# Patient Record
Sex: Male | Born: 1950 | Race: White | Hispanic: No | Marital: Married | Smoking: Former smoker
Health system: Southern US, Community
[De-identification: ages and names within clinical notes are randomized; demographics above are authoritative.]

## PROBLEM LIST (undated history)

## (undated) DIAGNOSIS — I219 Acute myocardial infarction, unspecified: Secondary | ICD-10-CM

## (undated) DIAGNOSIS — K222 Esophageal obstruction: Secondary | ICD-10-CM

## (undated) DIAGNOSIS — D126 Benign neoplasm of colon, unspecified: Secondary | ICD-10-CM

## (undated) DIAGNOSIS — Z87442 Personal history of urinary calculi: Secondary | ICD-10-CM

## (undated) DIAGNOSIS — R0600 Dyspnea, unspecified: Secondary | ICD-10-CM

## (undated) DIAGNOSIS — G4730 Sleep apnea, unspecified: Secondary | ICD-10-CM

## (undated) DIAGNOSIS — Z86010 Personal history of colonic polyps: Secondary | ICD-10-CM

## (undated) DIAGNOSIS — I2510 Atherosclerotic heart disease of native coronary artery without angina pectoris: Secondary | ICD-10-CM

## (undated) DIAGNOSIS — I10 Essential (primary) hypertension: Secondary | ICD-10-CM

## (undated) DIAGNOSIS — R51 Headache: Secondary | ICD-10-CM

## (undated) DIAGNOSIS — K219 Gastro-esophageal reflux disease without esophagitis: Secondary | ICD-10-CM

## (undated) DIAGNOSIS — Z720 Tobacco use: Secondary | ICD-10-CM

## (undated) DIAGNOSIS — K449 Diaphragmatic hernia without obstruction or gangrene: Secondary | ICD-10-CM

## (undated) DIAGNOSIS — E119 Type 2 diabetes mellitus without complications: Secondary | ICD-10-CM

## (undated) DIAGNOSIS — K644 Residual hemorrhoidal skin tags: Secondary | ICD-10-CM

## (undated) DIAGNOSIS — T4145XA Adverse effect of unspecified anesthetic, initial encounter: Secondary | ICD-10-CM

## (undated) DIAGNOSIS — I6529 Occlusion and stenosis of unspecified carotid artery: Secondary | ICD-10-CM

## (undated) DIAGNOSIS — N529 Male erectile dysfunction, unspecified: Secondary | ICD-10-CM

## (undated) DIAGNOSIS — M1990 Unspecified osteoarthritis, unspecified site: Secondary | ICD-10-CM

## (undated) DIAGNOSIS — T8859XA Other complications of anesthesia, initial encounter: Secondary | ICD-10-CM

## (undated) DIAGNOSIS — E785 Hyperlipidemia, unspecified: Secondary | ICD-10-CM

## (undated) DIAGNOSIS — I441 Atrioventricular block, second degree: Secondary | ICD-10-CM

## (undated) DIAGNOSIS — J189 Pneumonia, unspecified organism: Secondary | ICD-10-CM

## (undated) DIAGNOSIS — Z950 Presence of cardiac pacemaker: Secondary | ICD-10-CM

## (undated) DIAGNOSIS — J449 Chronic obstructive pulmonary disease, unspecified: Secondary | ICD-10-CM

## (undated) HISTORY — PX: CARDIAC CATHETERIZATION: SHX172

## (undated) HISTORY — DX: Type 2 diabetes mellitus without complications: E11.9

## (undated) HISTORY — DX: Sleep apnea, unspecified: G47.30

## (undated) HISTORY — DX: Essential (primary) hypertension: I10

## (undated) HISTORY — DX: Headache: R51

## (undated) HISTORY — PX: BACK SURGERY: SHX140

## (undated) HISTORY — DX: Benign neoplasm of colon, unspecified: D12.6

## (undated) HISTORY — DX: Atherosclerotic heart disease of native coronary artery without angina pectoris: I25.10

## (undated) HISTORY — DX: Acute myocardial infarction, unspecified: I21.9

## (undated) HISTORY — PX: COLONOSCOPY: SHX174

## (undated) HISTORY — DX: Residual hemorrhoidal skin tags: K64.4

## (undated) HISTORY — DX: Hyperlipidemia, unspecified: E78.5

## (undated) HISTORY — DX: Gastro-esophageal reflux disease without esophagitis: K21.9

## (undated) HISTORY — DX: Esophageal obstruction: K22.2

## (undated) HISTORY — DX: Chronic obstructive pulmonary disease, unspecified: J44.9

## (undated) HISTORY — DX: Male erectile dysfunction, unspecified: N52.9

## (undated) HISTORY — DX: Occlusion and stenosis of unspecified carotid artery: I65.29

## (undated) HISTORY — PX: ANGIOPLASTY: SHX39

## (undated) HISTORY — DX: Diaphragmatic hernia without obstruction or gangrene: K44.9

## (undated) HISTORY — DX: Atrioventricular block, second degree: I44.1

## (undated) HISTORY — DX: Tobacco use: Z72.0

## (undated) HISTORY — DX: Personal history of colonic polyps: Z86.010

---

## 1996-07-07 ENCOUNTER — Encounter: Payer: Self-pay | Admitting: Gastroenterology

## 1996-08-25 ENCOUNTER — Encounter: Payer: Self-pay | Admitting: Gastroenterology

## 1998-01-23 ENCOUNTER — Ambulatory Visit (HOSPITAL_BASED_OUTPATIENT_CLINIC_OR_DEPARTMENT_OTHER): Admission: RE | Admit: 1998-01-23 | Discharge: 1998-01-23 | Payer: Self-pay | Admitting: Urology

## 1998-06-03 ENCOUNTER — Ambulatory Visit (HOSPITAL_COMMUNITY): Admission: RE | Admit: 1998-06-03 | Discharge: 1998-06-03 | Payer: Self-pay | Admitting: Cardiovascular Disease

## 1999-11-29 ENCOUNTER — Encounter: Payer: Self-pay | Admitting: Emergency Medicine

## 1999-11-29 ENCOUNTER — Emergency Department (HOSPITAL_COMMUNITY): Admission: EM | Admit: 1999-11-29 | Discharge: 1999-11-30 | Payer: Self-pay | Admitting: *Deleted

## 2001-03-11 ENCOUNTER — Ambulatory Visit (HOSPITAL_COMMUNITY): Admission: RE | Admit: 2001-03-11 | Discharge: 2001-03-11 | Payer: Self-pay | Admitting: Cardiovascular Disease

## 2001-06-02 ENCOUNTER — Encounter: Payer: Self-pay | Admitting: Occupational Medicine

## 2001-06-02 ENCOUNTER — Encounter: Admission: RE | Admit: 2001-06-02 | Discharge: 2001-06-02 | Payer: Self-pay | Admitting: Occupational Medicine

## 2001-12-21 ENCOUNTER — Encounter: Payer: Self-pay | Admitting: Gastroenterology

## 2004-05-01 ENCOUNTER — Ambulatory Visit: Payer: Self-pay | Admitting: Internal Medicine

## 2004-05-13 ENCOUNTER — Ambulatory Visit: Payer: Self-pay | Admitting: Gastroenterology

## 2004-05-28 ENCOUNTER — Ambulatory Visit (HOSPITAL_COMMUNITY): Admission: RE | Admit: 2004-05-28 | Discharge: 2004-05-28 | Payer: Self-pay | Admitting: Gastroenterology

## 2004-05-28 ENCOUNTER — Ambulatory Visit: Payer: Self-pay | Admitting: Gastroenterology

## 2004-07-28 ENCOUNTER — Ambulatory Visit: Payer: Self-pay | Admitting: Internal Medicine

## 2004-07-29 ENCOUNTER — Ambulatory Visit: Payer: Self-pay | Admitting: Cardiology

## 2004-07-31 ENCOUNTER — Ambulatory Visit: Payer: Self-pay | Admitting: Cardiology

## 2004-11-18 ENCOUNTER — Ambulatory Visit: Payer: Self-pay | Admitting: Internal Medicine

## 2004-12-01 ENCOUNTER — Ambulatory Visit: Payer: Self-pay | Admitting: Internal Medicine

## 2005-03-09 ENCOUNTER — Ambulatory Visit: Payer: Self-pay | Admitting: Internal Medicine

## 2005-03-16 ENCOUNTER — Ambulatory Visit: Payer: Self-pay | Admitting: Cardiology

## 2005-04-03 ENCOUNTER — Inpatient Hospital Stay (HOSPITAL_BASED_OUTPATIENT_CLINIC_OR_DEPARTMENT_OTHER): Admission: RE | Admit: 2005-04-03 | Discharge: 2005-04-03 | Payer: Self-pay | Admitting: Cardiovascular Disease

## 2006-12-29 ENCOUNTER — Encounter: Payer: Self-pay | Admitting: Gastroenterology

## 2006-12-30 DIAGNOSIS — I252 Old myocardial infarction: Secondary | ICD-10-CM | POA: Insufficient documentation

## 2006-12-30 DIAGNOSIS — I2510 Atherosclerotic heart disease of native coronary artery without angina pectoris: Secondary | ICD-10-CM | POA: Insufficient documentation

## 2006-12-30 DIAGNOSIS — J449 Chronic obstructive pulmonary disease, unspecified: Secondary | ICD-10-CM | POA: Insufficient documentation

## 2006-12-30 DIAGNOSIS — Z87442 Personal history of urinary calculi: Secondary | ICD-10-CM | POA: Insufficient documentation

## 2006-12-30 DIAGNOSIS — E785 Hyperlipidemia, unspecified: Secondary | ICD-10-CM | POA: Insufficient documentation

## 2006-12-30 DIAGNOSIS — K219 Gastro-esophageal reflux disease without esophagitis: Secondary | ICD-10-CM | POA: Insufficient documentation

## 2006-12-30 DIAGNOSIS — I10 Essential (primary) hypertension: Secondary | ICD-10-CM | POA: Insufficient documentation

## 2007-01-28 ENCOUNTER — Encounter: Payer: Self-pay | Admitting: Gastroenterology

## 2007-03-02 ENCOUNTER — Encounter (INDEPENDENT_AMBULATORY_CARE_PROVIDER_SITE_OTHER): Payer: Self-pay | Admitting: *Deleted

## 2007-03-02 ENCOUNTER — Encounter (INDEPENDENT_AMBULATORY_CARE_PROVIDER_SITE_OTHER): Payer: Self-pay | Admitting: Gastroenterology

## 2007-03-02 ENCOUNTER — Ambulatory Visit (HOSPITAL_COMMUNITY): Admission: RE | Admit: 2007-03-02 | Discharge: 2007-03-02 | Payer: Self-pay | Admitting: Gastroenterology

## 2007-09-02 ENCOUNTER — Ambulatory Visit (HOSPITAL_COMMUNITY): Admission: RE | Admit: 2007-09-02 | Discharge: 2007-09-02 | Payer: Self-pay | Admitting: Gastroenterology

## 2007-09-02 ENCOUNTER — Encounter (INDEPENDENT_AMBULATORY_CARE_PROVIDER_SITE_OTHER): Payer: Self-pay | Admitting: Gastroenterology

## 2007-09-02 ENCOUNTER — Encounter (INDEPENDENT_AMBULATORY_CARE_PROVIDER_SITE_OTHER): Payer: Self-pay | Admitting: *Deleted

## 2007-12-09 ENCOUNTER — Ambulatory Visit: Payer: Self-pay | Admitting: Vascular Surgery

## 2007-12-22 DIAGNOSIS — K449 Diaphragmatic hernia without obstruction or gangrene: Secondary | ICD-10-CM | POA: Insufficient documentation

## 2007-12-27 ENCOUNTER — Ambulatory Visit: Payer: Self-pay | Admitting: Gastroenterology

## 2007-12-27 DIAGNOSIS — Z860100 Personal history of colon polyps, unspecified: Secondary | ICD-10-CM | POA: Insufficient documentation

## 2008-01-17 ENCOUNTER — Encounter: Admission: RE | Admit: 2008-01-17 | Discharge: 2008-01-17 | Payer: Self-pay | Admitting: Orthopedic Surgery

## 2009-02-18 ENCOUNTER — Ambulatory Visit: Payer: Self-pay | Admitting: Gastroenterology

## 2009-02-18 ENCOUNTER — Encounter: Admission: RE | Admit: 2009-02-18 | Discharge: 2009-02-18 | Payer: Self-pay | Admitting: Internal Medicine

## 2009-03-06 ENCOUNTER — Ambulatory Visit: Payer: Self-pay | Admitting: Gastroenterology

## 2009-03-12 ENCOUNTER — Encounter: Payer: Self-pay | Admitting: Gastroenterology

## 2009-08-22 ENCOUNTER — Ambulatory Visit: Payer: Self-pay | Admitting: Vascular Surgery

## 2010-02-04 ENCOUNTER — Ambulatory Visit: Payer: Self-pay | Admitting: Gastroenterology

## 2010-02-04 ENCOUNTER — Encounter (INDEPENDENT_AMBULATORY_CARE_PROVIDER_SITE_OTHER): Payer: Self-pay | Admitting: *Deleted

## 2010-02-13 ENCOUNTER — Ambulatory Visit: Payer: Self-pay | Admitting: Internal Medicine

## 2010-02-13 DIAGNOSIS — J329 Chronic sinusitis, unspecified: Secondary | ICD-10-CM | POA: Insufficient documentation

## 2010-02-13 LAB — CONVERTED CEMR LAB
ALT: 19 units/L (ref 0–53)
AST: 19 units/L (ref 0–37)
Albumin: 3.9 g/dL (ref 3.5–5.2)
Alkaline Phosphatase: 58 units/L (ref 39–117)
BUN: 17 mg/dL (ref 6–23)
Basophils Absolute: 0.1 10*3/uL (ref 0.0–0.1)
Basophils Relative: 0.6 % (ref 0.0–3.0)
Bilirubin Urine: NEGATIVE
Bilirubin, Direct: 0.1 mg/dL (ref 0.0–0.3)
CO2: 27 meq/L (ref 19–32)
Calcium: 9.3 mg/dL (ref 8.4–10.5)
Chloride: 96 meq/L (ref 96–112)
Cholesterol, target level: 200 mg/dL
Cholesterol: 164 mg/dL (ref 0–200)
Creatinine, Ser: 1.2 mg/dL (ref 0.4–1.5)
Eosinophils Absolute: 0.4 10*3/uL (ref 0.0–0.7)
Eosinophils Relative: 3.8 % (ref 0.0–5.0)
GFR calc non Af Amer: 69.36 mL/min (ref >60)
Glucose, Bld: 87 mg/dL (ref 70–99)
HCT: 44.4 % (ref 39.0–52.0)
HDL goal, serum: 40 mg/dL
HDL: 40 mg/dL (ref >39.00)
Hemoglobin, Urine: NEGATIVE
Hemoglobin: 15.4 g/dL (ref 13.0–17.0)
Ketones, ur: NEGATIVE mg/dL
LDL Cholesterol: 88 mg/dL (ref 0–99)
LDL Goal: 70 mg/dL
Leukocytes, UA: NEGATIVE
Lymphocytes Relative: 42.9 % (ref 12.0–46.0)
Lymphs Abs: 4.2 10*3/uL — ABNORMAL HIGH (ref 0.7–4.0)
MCHC: 34.6 g/dL (ref 30.0–36.0)
MCV: 95.2 fL (ref 78.0–100.0)
Monocytes Absolute: 1 10*3/uL (ref 0.1–1.0)
Monocytes Relative: 9.9 % (ref 3.0–12.0)
Neutro Abs: 4.1 10*3/uL (ref 1.4–7.7)
Neutrophils Relative %: 42.8 % — ABNORMAL LOW (ref 43.0–77.0)
Nitrite: NEGATIVE
PSA: 0.47 ng/mL (ref 0.10–4.00)
Platelets: 285 10*3/uL (ref 150.0–400.0)
Potassium: 4.6 meq/L (ref 3.5–5.1)
RBC: 4.66 M/uL (ref 4.22–5.81)
RDW: 13.8 % (ref 11.5–14.6)
Sodium: 131 meq/L — ABNORMAL LOW (ref 135–145)
Specific Gravity, Urine: 1.015 (ref 1.000–1.030)
TSH: 3.07 microintl units/mL (ref 0.35–5.50)
Total Bilirubin: 0.5 mg/dL (ref 0.3–1.2)
Total CHOL/HDL Ratio: 4
Total CK: 58 units/L (ref 7–232)
Total Protein, Urine: NEGATIVE mg/dL
Total Protein: 7.1 g/dL (ref 6.0–8.3)
Triglycerides: 179 mg/dL — ABNORMAL HIGH (ref 0.0–149.0)
Urine Glucose: NEGATIVE mg/dL
Urobilinogen, UA: 0.2 (ref 0.0–1.0)
VLDL: 35.8 mg/dL (ref 0.0–40.0)
WBC: 9.7 10*3/uL (ref 4.5–10.5)
pH: 6 (ref 5.0–8.0)

## 2010-02-14 ENCOUNTER — Telehealth: Payer: Self-pay | Admitting: Internal Medicine

## 2010-02-14 ENCOUNTER — Encounter: Payer: Self-pay | Admitting: Internal Medicine

## 2010-03-10 ENCOUNTER — Ambulatory Visit: Payer: Self-pay | Admitting: Internal Medicine

## 2010-03-10 DIAGNOSIS — E871 Hypo-osmolality and hyponatremia: Secondary | ICD-10-CM | POA: Insufficient documentation

## 2010-03-10 DIAGNOSIS — F17200 Nicotine dependence, unspecified, uncomplicated: Secondary | ICD-10-CM | POA: Insufficient documentation

## 2010-03-10 LAB — CONVERTED CEMR LAB
BUN: 17 mg/dL (ref 6–23)
CO2: 30 meq/L (ref 19–32)
Calcium: 9.6 mg/dL (ref 8.4–10.5)
Chloride: 103 meq/L (ref 96–112)
Cortisol, Plasma: 8.7 ug/dL
Creatinine, Ser: 1 mg/dL (ref 0.4–1.5)
GFR calc non Af Amer: 81.47 mL/min (ref >60)
Glucose, Bld: 85 mg/dL (ref 70–99)
Potassium: 4.6 meq/L (ref 3.5–5.1)
Sodium: 139 meq/L (ref 135–145)

## 2010-03-12 ENCOUNTER — Ambulatory Visit: Payer: Self-pay | Admitting: Gastroenterology

## 2010-03-12 DIAGNOSIS — Z860100 Personal history of colon polyps, unspecified: Secondary | ICD-10-CM

## 2010-03-12 HISTORY — DX: Personal history of colonic polyps: Z86.010

## 2010-03-12 HISTORY — DX: Personal history of colon polyps, unspecified: Z86.0100

## 2010-03-18 ENCOUNTER — Ambulatory Visit: Payer: Self-pay | Admitting: Cardiovascular Disease

## 2010-03-18 ENCOUNTER — Encounter: Payer: Self-pay | Admitting: Gastroenterology

## 2010-04-15 ENCOUNTER — Encounter: Payer: Self-pay | Admitting: Internal Medicine

## 2010-05-11 ENCOUNTER — Encounter: Payer: Self-pay | Admitting: Gastroenterology

## 2010-05-20 NOTE — Assessment & Plan Note (Signed)
Summary: new bcbs pt-per lisha/dr [id number]-#-stc   Vital Signs:  Patient profile:   60 year old male Height:      69.5 inches Weight:      197 pounds BMI:     28.78 O2 Sat:      95 % on Room air Temp:     98.1 degrees F oral Pulse rate:   70 / minute Pulse rhythm:   regular Resp:     16 per minute BP sitting:   102 / 64  (left arm) Cuff size:   large  Vitals Entered By: Rock Nephew CMA (February 13, 2010 1:19 PM)  Nutrition Counseling: Patient's BMI is greater than 25 and therefore counseled on weight management options.  O2 Flow:  Room air CC: New to establish, Preventive Care, Hypertension Management, Lipid Management Pain Assessment Patient in pain? no       Does patient need assistance? Functional Status Self care Ambulation Normal   Primary Care Provider:  Etta Grandchild MD  CC:  New to establish, Preventive Care, Hypertension Management, and Lipid Management.  History of Present Illness: New to me for a complete physical. He wants to see an ENT doctor b/c he has chronic post-nasal drip, nasal congestion, and sinus pressure.  He has developed muscle and joint aches since he changed from Lipitor to Crestor so he wants to change back to Lipitor.  Dyspepsia History:      He has no alarm features of dyspepsia including no history of melena, hematochezia, dysphagia, persistent vomiting, or involuntary weight loss > 5%.  There is a prior history of GERD.  A prior EGD has been done.    Hypertension History:      He denies headache, chest pain, palpitations, dyspnea with exertion, orthopnea, PND, peripheral edema, visual symptoms, neurologic problems, syncope, and side effects from treatment.  He notes no problems with any antihypertensive medication side effects.        Positive major cardiovascular risk factors include male age 58 years old or older, hyperlipidemia, hypertension, and current tobacco user.  Negative major cardiovascular risk factors include no  history of diabetes and negative family history for ischemic heart disease.        Positive history for target organ damage include ASHD (either angina/prior MI/prior CABG).  Further assessment for target organ damage reveals no history of cardiac end-organ damage (CHF/LVH), stroke/TIA, peripheral vascular disease, renal insufficiency, or hypertensive retinopathy.    Lipid Management History:      Positive NCEP/ATP III risk factors include male age 95 years old or older, current tobacco user, hypertension, and ASHD (either angina/prior MI/prior CABG).  Negative NCEP/ATP III risk factors include non-diabetic, no family history for ischemic heart disease, no prior stroke/TIA, no peripheral vascular disease, and no history of aortic aneurysm.        The patient states that he knows about the "Therapeutic Lifestyle Change" diet.  His compliance with the TLC diet is not at all.  The patient expresses understanding of adjunctive measures for cholesterol lowering.  Adjunctive measures started by the patient include fiber, ASA, and limit alcohol consumpton.  He notes side effects from his lipid-lowering medication.  The patient notes symptoms to suggest myopathy or liver disease.       Preventive Screening-Counseling & Management  Alcohol-Tobacco     Alcohol drinks/day: <1     Alcohol type: beer     >5/day in last 3 mos: no     Alcohol Counseling: not indicated; use of  alcohol is not excessive or problematic     Feels need to cut down: no     Feels annoyed by complaints: no     Feels guilty re: drinking: no     Needs 'eye opener' in am: no     Smoking Status: current     Smoking Cessation Counseling: yes     Smoke Cessation Stage: ready     Packs/Day: 0.5     Year Started: 1971     Pack years: 35     Tobacco Counseling: to quit use of tobacco products  Hep-HIV-STD-Contraception     Hepatitis Risk: no risk noted     HIV Risk: no risk noted     STD Risk: no risk noted     Dental Visit-last 6  months yes     Dental Care Counseling: to seek dental care; no dental care within six months     TSE monthly: yes     Testicular SE Education/Counseling to perform regular STE     Sun Exposure-Excessive: no     Sun Exposure Counseling: to decrease sun exposure  Safety-Violence-Falls     Seat Belt Use: yes     Helmet Use: n/a     Firearms in the Home: no firearms in the home     Smoke Detectors: yes     Violence in the Home: no risk noted      Sexual History:  currently monogamous.        Drug Use:  never.        Blood Transfusions:  no.    Clinical Review Panels:  Prevention   Last Colonoscopy:  DONE (03/06/2009)  Immunizations   Last Tetanus Booster:  Historical (04/20/2001)   Last Flu Vaccine:  Historical (02/18/2005)  Diabetes Management   Last Flu Vaccine:  Historical (02/18/2005)   Medications Prior to Update: 1)  Crestor 20 Mg Tabs (Rosuvastatin Calcium) .Marland Kitchen.. 1 Tablet By Mouth Once Daily 2)  Aspirin 81 Mg Tbec (Aspirin) .Marland Kitchen.. 1 Tablet By Mouth Once Daily 3)  Metoprolol Tartrate 100 Mg  Tabs (Metoprolol Tartrate) .Marland Kitchen.. 1 Tablet By Mouth Once Daily 4)  Aciphex 20 Mg  Tbec (Rabeprazole Sodium) .Marland Kitchen.. 1 Tablet By Mouth Once Daily 5)  Lisinopril 10 Mg Tabs (Lisinopril) .... Take 1 Tablet By Mouth Daily 6)  Hydrochlorothiazide 25 Mg Tabs (Hydrochlorothiazide) .... Take 1/2 Tablet By Mouth Once Daily 7)  Trilipix 135 Mg Cpdr (Choline Fenofibrate) .... Take 1 Tablet By Mouth Once Daily 8)  Moviprep 100 Gm  Solr (Peg-Kcl-Nacl-Nasulf-Na Asc-C) .... As Per Prep Instructions.  Current Medications (verified): 1)  Aspirin 81 Mg Tbec (Aspirin) .Marland Kitchen.. 1 Tablet By Mouth Once Daily 2)  Metoprolol Tartrate 100 Mg  Tabs (Metoprolol Tartrate) .Marland Kitchen.. 1 Tablet By Mouth Once Daily 3)  Aciphex 20 Mg  Tbec (Rabeprazole Sodium) .Marland Kitchen.. 1 Tablet By Mouth Once Daily 4)  Lisinopril 10 Mg Tabs (Lisinopril) .... Take 1 Tablet By Mouth Daily 5)  Hydrochlorothiazide 25 Mg Tabs (Hydrochlorothiazide) ....  Take 1/2 Tablet By Mouth Once Daily 6)  Trilipix 135 Mg Cpdr (Choline Fenofibrate) .... Take 1 Tablet By Mouth Once Daily 7)  Moviprep 100 Gm  Solr (Peg-Kcl-Nacl-Nasulf-Na Asc-C) .... As Per Prep Instructions. 8)  Lipitor 80 Mg Tabs (Atorvastatin Calcium) .... One By Mouth Once Daily  Allergies (verified): 1)  ! Norvasc 2)  ! Crestor (Rosuvastatin Calcium)  Past History:  Past Medical History: Last updated: 12/22/2007 Tobacco Use COPD Coronary artery disease GERD Hyperlipidemia Hypertension Myocardial  infarction, hx of Nephrolithiasis, hx of Headache ED  Current Problems:  HIATAL HERNIA (ICD-553.3) HEMORRHOIDS, EXTERNAL (ICD-455.3) HEADACHE (ICD-784.0) FAMILY HISTORY DIABETES 1ST DEGREE RELATIVE (ICD-V18.0) FAMILY HISTORY BREAST CANCER 1ST DEGREE RELATIVE <50 (ICD-V16.3) NEPHROLITHIASIS, HX OF (ICD-V13.01) MYOCARDIAL INFARCTION, HX OF (ICD-412) HYPERTENSION (ICD-401.9) HYPERLIPIDEMIA (ICD-272.4) GERD (ICD-530.81) CORONARY ARTERY DISEASE (ICD-414.00) COPD (ICD-496)  Past Surgical History: Last updated: 12/27/2007 Angioplasty x3  Family History: Last updated: 12/27/2007 Family History Breast cancer 1st degree relative <50 Family History Diabetes 1st degree relative Family History of Cardiovascular disorder No FH of Colon Cancer:  Social History: Last updated: 02/13/2010 Occupation: Curator Married Current Smoker Alcohol use-yes Daily Caffeine Use  Risk Factors: Alcohol Use: <1 (02/13/2010) >5 drinks/d w/in last 3 months: no (02/13/2010) Caffeine Use: 3 (12/27/2007)  Risk Factors: Smoking Status: current (02/13/2010) Packs/Day: 0.5 (02/13/2010)  Family History: Reviewed history from 12/27/2007 and no changes required. Family History Breast cancer 1st degree relative <50 Family History Diabetes 1st degree relative Family History of Cardiovascular disorder No FH of Colon Cancer:  Social History: Reviewed history from 12/27/2007 and no changes  required. Occupation: Curator Married Current Smoker Alcohol use-yes Daily Caffeine Use Packs/Day:  0.5 Hepatitis Risk:  no risk noted HIV Risk:  no risk noted STD Risk:  no risk noted Dental Care w/in 6 mos.:  yes Sun Exposure-Excessive:  no Seat Belt Use:  yes Sexual History:  currently monogamous Drug Use:  never Blood Transfusions:  no  Review of Systems       The patient complains of weight gain.  The patient denies anorexia, fever, weight loss, decreased hearing, hoarseness, chest pain, syncope, dyspnea on exertion, peripheral edema, prolonged cough, headaches, hemoptysis, abdominal pain, melena, hematochezia, severe indigestion/heartburn, hematuria, suspicious skin lesions, enlarged lymph nodes, angioedema, and testicular masses.   GU:  Complains of decreased libido and erectile dysfunction; denies discharge, dysuria, hematuria, incontinence, nocturia, urinary frequency, and urinary hesitancy.  Physical Exam  General:  Well developed, well nourished, no acute distress.healthy appearing.  healthy appearing.   Head:  Normocephalic and atraumatic. Eyes:  PERRLA, no icterus.exam deferred to patient's ophthalmologist.  exam deferred to patient's ophthalmologist.   Nose:  no external deformity, no nasal discharge, no airflow obstruction, no intranasal foreign body, no nasal polyps, no nasal mucosal lesions, no mucosal friability, no active bleeding or clots, no sinus percussion tenderness, no septum abnormalities, mucosal erythema, and mucosal edema.   Mouth:  good dentition, no gingival abnormalities, pharynx pink and moist, no erythema, no exudates, no posterior lymphoid hypertrophy, no pharyngeal crowing, and no lesions.   Neck:  supple, full ROM, no masses, no thyromegaly, no thyroid nodules or tenderness, no JVD, normal carotid upstroke, no carotid bruits, no cervical lymphadenopathy, and no neck tenderness.   Lungs:  normal respiratory effort, no intercostal retractions, no  accessory muscle use, normal breath sounds, no dullness, no fremitus, no crackles, and no wheezes.   Heart:  normal rate, regular rhythm, no murmur, no gallop, no rub, and no JVD.   Abdomen:  soft, non-tender, normal bowel sounds, no distention, no masses, no guarding, no rigidity, no rebound tenderness, no abdominal hernia, no inguinal hernia, no hepatomegaly, and no splenomegaly.   Rectal:  No external abnormalities noted. Normal sphincter tone. No rectal masses or tenderness. Genitalia:  circumcised, no hydrocele, no varicocele, no scrotal masses, no testicular masses or atrophy, no cutaneous lesions, and no urethral discharge.   Prostate:  no nodules, no asymmetry, no induration, and 1+ enlarged.   Msk:  normal ROM, no  joint tenderness, no joint swelling, no joint warmth, no redness over joints, no joint deformities, no joint instability, and no crepitation.   Pulses:  R and L carotid,radial,femoral,dorsalis pedis and posterior tibial pulses are full and equal bilaterally Extremities:  No clubbing, cyanosis, edema, or deformity noted with normal full range of motion of all joints.   Neurologic:  No cranial nerve deficits noted. Station and gait are normal. Plantar reflexes are down-going bilaterally. DTRs are symmetrical throughout. Sensory, motor and coordinative functions appear intact. Skin:  turgor normal, no rashes, no suspicious lesions, no ecchymoses, no petechiae, no purpura, no ulcerations, no edema, excessive tan, and solar damage.   Cervical Nodes:  no anterior cervical adenopathy and no posterior cervical adenopathy.   Axillary Nodes:  no R axillary adenopathy and no L axillary adenopathy.   Inguinal Nodes:  no R inguinal adenopathy and no L inguinal adenopathy.   Psych:  Cognition and judgment appear intact. Alert and cooperative with normal attention span and concentration. No apparent delusions, illusions, hallucinations   Impression & Recommendations:  Problem # 1:  SINUSITIS,  CHRONIC (ICD-473.9) Assessment New  Orders: ENT Referral (ENT)  Problem # 2:  HYPERLIPIDEMIA (ICD-272.4) Assessment: Deteriorated  The following medications were removed from the medication list:    Crestor 20 Mg Tabs (Rosuvastatin calcium) .Marland Kitchen... 1 tablet by mouth once daily His updated medication list for this problem includes:    Trilipix 135 Mg Cpdr (Choline fenofibrate) .Marland Kitchen... Take 1 tablet by mouth once daily    Lipitor 80 Mg Tabs (Atorvastatin calcium) ..... One by mouth once daily  Orders: Venipuncture ([address]) TLB-Lipid Panel (80061-LIPID) TLB-BMP (Basic Metabolic Panel-BMET) (80048-METABOL) TLB-CBC Platelet - w/Differential (85025-CBCD) TLB-Hepatic/Liver Function Pnl (80076-HEPATIC) TLB-TSH (Thyroid Stimulating Hormone) (84443-TSH) TLB-PSA (Prostate Specific Antigen) (84153-PSA) TLB-Udip w/ Micro (81001-URINE) TLB-CK Total Only(Creatine Kinase/CPK) (82550-CK)  Problem # 3:  ROUTINE GENERAL MEDICAL EXAM@HEALTH  CARE FACL (ICD-V70.0) Assessment: New  Orders: Venipuncture (60454) TLB-Lipid Panel (80061-LIPID) TLB-BMP (Basic Metabolic Panel-BMET) (80048-METABOL) TLB-CBC Platelet - w/Differential (85025-CBCD) TLB-Hepatic/Liver Function Pnl (80076-HEPATIC) TLB-TSH (Thyroid Stimulating Hormone) (84443-TSH) TLB-PSA (Prostate Specific Antigen) (84153-PSA) TLB-Udip w/ Micro (81001-URINE) TLB-CK Total Only(Creatine Kinase/CPK) (82550-CK) Hemoccult Guaiac-1 spec.(in office) (09811)  Colonoscopy: DONE (03/06/2009) Td Booster: Historical (04/20/2001)   Flu Vax: Historical (02/18/2005)   Next Colonoscopy due:: 02/2012 (03/06/2009)  Discussed using sunscreen, use of alcohol, drug use, self testicular exam, routine dental care, routine eye care, routine physical exam, seat belts, multiple vitamins,  and recommendations for immunizations.  Discussed exercise and checking cholesterol.  Also recommend checking PSA.  Problem # 4:  HYPERTENSION (ICD-401.9) Assessment: Improved  His  updated medication list for this problem includes:    Metoprolol Tartrate 100 Mg Tabs (Metoprolol tartrate) .Marland Kitchen... 1 tablet by mouth once daily    Lisinopril 10 Mg Tabs (Lisinopril) .Marland Kitchen... Take 1 tablet by mouth daily    Hydrochlorothiazide 25 Mg Tabs (Hydrochlorothiazide) .Marland Kitchen... Take 1/2 tablet by mouth once daily  Orders: Venipuncture (91478) TLB-Lipid Panel (80061-LIPID) TLB-BMP (Basic Metabolic Panel-BMET) (80048-METABOL) TLB-CBC Platelet - w/Differential (85025-CBCD) TLB-Hepatic/Liver Function Pnl (80076-HEPATIC) TLB-TSH (Thyroid Stimulating Hormone) (84443-TSH) TLB-PSA (Prostate Specific Antigen) (84153-PSA) TLB-Udip w/ Micro (81001-URINE) TLB-CK Total Only(Creatine Kinase/CPK) (82550-CK)  Complete Medication List: 1)  Aspirin 81 Mg Tbec (Aspirin) .Marland Kitchen.. 1 tablet by mouth once daily 2)  Metoprolol Tartrate 100 Mg Tabs (Metoprolol tartrate) .Marland Kitchen.. 1 tablet by mouth once daily 3)  Aciphex 20 Mg Tbec (Rabeprazole sodium) .Marland Kitchen.. 1 tablet by mouth once daily 4)  Lisinopril 10 Mg Tabs (Lisinopril) .... Take 1 tablet  by mouth daily 5)  Hydrochlorothiazide 25 Mg Tabs (Hydrochlorothiazide) .... Take 1/2 tablet by mouth once daily 6)  Trilipix 135 Mg Cpdr (Choline fenofibrate) .... Take 1 tablet by mouth once daily 7)  Moviprep 100 Gm Solr (Peg-kcl-nacl-nasulf-na asc-c) .... As per prep instructions. 8)  Lipitor 80 Mg Tabs (Atorvastatin calcium) .... One by mouth once daily  Hypertension Assessment/Plan:      The patient's hypertensive risk group is category C: Target organ damage and/or diabetes.  Today's blood pressure is 102/64.  His blood pressure goal is < 140/90.  Lipid Assessment/Plan:      Based on NCEP/ATP III, the patient's risk factor category is "history of coronary disease, peripheral vascular disease, cerebrovascular disease, or aortic aneurysm along with either diabetes, current smoker, or LDL > 130 plus HDL < 40 plus triglycerides > 200".  The patient's lipid goals are as follows:  Total cholesterol goal is 200; LDL cholesterol goal is 70; HDL cholesterol goal is 40; Triglyceride goal is 150.    Colorectal Screening:  Current Recommendations:    Hemoccult: NEG X 1 today    Colonoscopy recommended: scheduled with G.I.  PSA Screening:    Reviewed PSA screening recommendations: PSA ordered  Immunization & Chemoprophylaxis:    Tetanus vaccine: Historical  (04/20/2001)    Influenza vaccine: Historical  (02/18/2005)  Patient Instructions: 1)  Please schedule a follow-up appointment in 3 months. 2)  Tobacco is very bad for your health and your loved ones! You Should stop smoking!. 3)  Stop Smoking Tips: Choose a Quit date. Cut down before the Quit date. decide what you will do as a substitute when you feel the urge to smoke(gum,toothpick,exercise). 4)  It is important that you exercise regularly at least 20 minutes 5 times a week. If you develop chest pain, have severe difficulty breathing, or feel very tired , stop exercising immediately and seek medical attention. 5)  You need to lose weight. Consider a lower calorie diet and regular exercise.  Prescriptions: LIPITOR 80 MG TABS (ATORVASTATIN CALCIUM) One by mouth once daily  #30 x 11   Entered and Authorized by:   Etta Grandchild MD   Signed by:   Etta Grandchild MD on 02/13/2010   Method used:   Print then Give to Patient   RxID:   [id number]    Orders Added: 1)  Venipuncture [36644] 2)  TLB-Lipid Panel [80061-LIPID] 3)  TLB-BMP (Basic Metabolic Panel-BMET) [80048-METABOL] 4)  TLB-CBC Platelet - w/Differential [85025-CBCD] 5)  TLB-Hepatic/Liver Function Pnl [80076-HEPATIC] 6)  TLB-TSH (Thyroid Stimulating Hormone) [84443-TSH] 7)  TLB-PSA (Prostate Specific Antigen) [84153-PSA] 8)  TLB-Udip w/ Micro [81001-URINE] 9)  TLB-CK Total Only(Creatine Kinase/CPK) [82550-CK] 10)  ENT Referral [ENT] 11)  Hemoccult Guaiac-1 spec.(in office) [82270] 12)  New Patient 40-64 years [99386] 30)  New Patient Level IV  [99204]    Preventive Care Screening  Last Tetanus Booster:    Date:  04/20/2001    Results:  Historical

## 2010-05-20 NOTE — Letter (Signed)
Summary: [facility] Instructions  [facility]  [address] [place], [place] [address]   Phone: [phone]  Fax: [phone]       Daniel Carey    04/02/1951    MRN: [id number]        Procedure Day /Date: 03/12/2010  Wednesday     Arrival Time: 7:30am     Procedure Time: 8:30am     Location of Procedure:                    X  [facility] (4th Floor)   PREPARATION FOR COLONOSCOPY WITH MOVIPREP   Starting 5 days prior to your procedure 03/07/2010 do not eat nuts, seeds, popcorn, corn, beans, peas,  salads, or any raw vegetables.  Do not take any fiber supplements (e.g. Metamucil, Citrucel, and Benefiber).  THE DAY BEFORE YOUR PROCEDURE         DATE: 03/11/2010  DAY: Tuesday  1.  Drink clear liquids the entire day-NO SOLID FOOD  2.  Do not drink anything colored red or purple.  Avoid juices with pulp.  No orange juice.  3.  Drink at least 64 oz. (8 glasses) of fluid/clear liquids during the day to prevent dehydration and help the prep work efficiently.  CLEAR LIQUIDS INCLUDE: Water Jello Ice Popsicles Tea (sugar ok, no milk/cream) Powdered fruit flavored drinks Coffee (sugar ok, no milk/cream) Gatorade Juice: apple, white grape, white cranberry  Lemonade Clear bullion, consomm, broth Carbonated beverages (any kind) Strained chicken noodle soup Hard Candy                             4.  In the morning, mix first dose of MoviPrep solution:    Empty 1 Pouch A and 1 Pouch B into the disposable container    Add lukewarm drinking water to the top line of the container. Mix to dissolve    Refrigerate (mixed solution should be used within 24 hrs)  5.  Begin drinking the prep at 5:00 p.m. The MoviPrep container is divided by 4 marks.   Every 15 minutes drink the solution down to the next mark (approximately 8 oz) until the full liter is complete.   6.  Follow completed prep with 16 oz of clear liquid of your choice (Nothing red or purple).   Continue to drink clear liquids until bedtime.  7.  Before going to bed, mix second dose of MoviPrep solution:    Empty 1 Pouch A and 1 Pouch B into the disposable container    Add lukewarm drinking water to the top line of the container. Mix to dissolve    Refrigerate  THE DAY OF YOUR PROCEDURE      DATE: 03/12/2010   DAY: Wednesday  Beginning at 3:30am (5 hours before procedure):         1. Every 15 minutes, drink the solution down to the next mark (approx 8 oz) until the full liter is complete.  2. Follow completed prep with 16 oz. of clear liquid of your choice.    3. You may drink clear liquids until 6:30am (2 HOURS BEFORE PROCEDURE).   MEDICATION INSTRUCTIONS  Unless otherwise instructed, you should take regular prescription medications with a small sip of water   as early as possible the morning of your procedure.           OTHER INSTRUCTIONS  You will need a responsible adult at least  59 years of age to accompany you and drive you home.   This person must remain in the waiting room during your procedure.  Wear loose fitting clothing that is easily removed.  Leave jewelry and other valuables at home.  However, you may wish to bring a book to read or  an iPod/MP3 player to listen to music as you wait for your procedure to start.  Remove all body piercing jewelry and leave at home.  Total time from sign-in until discharge is approximately 2-3 hours.  You should go home directly after your procedure and rest.  You can resume normal activities the  day after your procedure.  The day of your procedure you should not:   Drive   Make legal decisions   Operate machinery   Drink alcohol   Return to work  You will receive specific instructions about eating, activities and medications before you leave.    The above instructions have been reviewed and explained to me by   _______________________    I fully understand and can verbalize these  instructions _____________________________ Date _________

## 2010-05-20 NOTE — Letter (Signed)
Summary: Patient Notice- Polyp Results  [facility]  [address] [place], [place] [address]   Phone: [phone]  Fax: [phone]        March 18, 2010 MRN: [id number]    MINORU CHAP [address] [place], [place]  [address]    Dear Mr. LACEK,  I am pleased to inform you that the colon polyp(s) removed during your recent colonoscopy was (were) found to be benign (no cancer detected) upon pathologic examination.  I recommend you have a repeat colonoscopy examination in 1_ years to look for recurrent polyps, as having colon polyps increases your risk for having recurrent polyps or even colon cancer in the future.  Should you develop new or worsening symptoms of abdominal pain, bowel habit changes or bleeding from the rectum or bowels, please schedule an evaluation with either your primary care physician or with me.  Additional information/recommendations:  _x_ No further action with gastroenterology is needed at this time. Please      follow-up with your primary care physician for your other healthcare      needs.  __ Please call [phone] to schedule a return visit to review your      situation.  __ Please keep your follow-up visit as already scheduled.  __ Continue treatment plan as outlined the day of your exam.  Please call us if you are having persistent problems or have questions about your condition that have not been fully answered at this time.  Sincerely,  Mardella Layman MD [facility]  This letter has been electronically signed by your physician.  Appended Document: Patient Notice- Polyp Results Letter mailed

## 2010-05-20 NOTE — Procedures (Signed)
Summary: Colonoscopy  Patient: Daniel Carey Note: All result statuses are Final unless otherwise noted.  Tests: (1) Colonoscopy (COL)   COL Colonoscopy           DONE     [facility]     [address]     [place], [place]  [address]           COLONOSCOPY PROCEDURE REPORT           PATIENT:  [name], [name]  MR#:  [id number]     BIRTHDATE:  Jul 10, 1950, 59 yrs. old  GENDER:  male     ENDOSCOPIST:  Vania Rea. Jarold Motto, MD, [facility]     REF. BY:     PROCEDURE DATE:  03/12/2010     PROCEDURE:  Colonoscopy with snare polypectomy     ASA CLASS:  Class II     INDICATIONS:  history of pre-cancerous (adenomatous) colon polyps           MEDICATIONS:   Fentanyl 75 mcg IV, Versed 8 mg IV           DESCRIPTION OF PROCEDURE:   After the risks benefits and     alternatives of the procedure were thoroughly explained, informed     consent was obtained.  Digital rectal exam was performed and     revealed no abnormalities.   The LB [id number] endoscope     was introduced through the anus and advanced to the cecum, which     was identified by both the appendix and ileocecal valve, without     limitations.  The quality of the prep was excellent, using     MoviPrep.  The instrument was then slowly withdrawn as the colon     was fully examined.     <<PROCEDUREIMAGES>>           FINDINGS:  A sessile polyp was found at the hepatic flexure. area     ofTATOO SEE AND EDGES SNARE EXCISED.#1.  A sessile polyp was found     at the hepatic flexure. ANOTHER 1 CM. FLAT POLYP ALSO HOT SNARE     EXCISED.#2.  This was otherwise a normal examination of the colon.     Retroflexed views in the rectum revealed no abnormalities.    The     scope was then withdrawn from the patient and the procedure     completed.           COMPLICATIONS:  None     ENDOSCOPIC IMPRESSION:     1) Sessile polyp at the hepatic flexure     2) Sessile polyp at the hepatic flexure     3) Otherwise normal examination     R/O  ATYPIA IN RIGHT COLON ADENOMAS.     RECOMMENDATIONS:     1) Await biopsy results     2) Repeat Colonoscopy in 1 year.     REPEAT EXAM:  No           ______________________________     Vania Rea. Jarold Motto, MD, Clement[facility] Graham           CC:           n.     eSIGNED:   Vania Rea. Gerald Kuehl at 03/12/2010 08:42 AM           Joaquin Music, [id number]  Note: An exclamation mark (!) indicates a result that was not dispersed into the flowsheet. Document Creation Date: 03/12/2010 8:43 AM _______________________________________________________________________  (1) Order  result status: Final Collection or observation date-time: 03/12/2010 08:33 Requested date-time:  Receipt date-time:  Reported date-time:  Referring Physician:   Ordering Physician: Sheryn Bison [id number]) Specimen Source:  Source: Launa Grill Order Number: [id number] Lab site:   Appended Document: Colonoscopy egd next year with colon....  Appended Document: Colonoscopy     Procedures Next Due Date:    Colonoscopy: 03/2011

## 2010-05-20 NOTE — Procedures (Signed)
Summary: Colon and Path DR. Randa Evens   Colonoscopy  Procedure date:  09/02/2007  Findings:      Location:  [facility].    NAME:  Daniel Carey, Daniel Carey               ACCOUNT NO.:  [id number]      MEDICAL RECORD NO.:  [id number]          PATIENT TYPE:  AMB      LOCATION:  ENDO                         FACILITY:  [facility]      PHYSICIAN:  James L. Malon Kindle., M.D.DATE OF BIRTH:  1950-06-15      DATE OF PROCEDURE:  09/02/2007   DATE OF DISCHARGE:                                  OPERATIVE REPORT      PROCEDURE:  Colonoscopy and polypectomy with fulguration with argon   plasma coagulator.      MEDICATIONS:  Fentanyl 100 mcg, Versed 10 mg IV, Phenergan 12.5 mg IV.      INDICATIONS FOR PROCEDURE:  The patient had removal of several   adenomatous polyps in the distal descending colon with one a very large   flat lesion.  It was biopsied and came back showing this to be evidence   of polyp.  The area was tattooed with a mL of [place] ink.  It was thought   by __________  this as much as possible was removed with a snare back in   November and the entire area was fulgurated with argon plasma   coagulator.  The procedure is done to re-evaluate this area.      DESCRIPTION OF PROCEDURE:  The procedure had been explained and the   patient consent obtained.  In the left lateral decubitus position the   Pentax pediatric scope was inserted and the prep was excellent.  When we   reached the cecum the cecum was identified clearly with the ileocecal   valve and appendiceal orifice.  The scope was withdrawn in the ascending   colon with two 0.5 cm flat polyps that were both removed with the snare   and put in a single jar.  No polyps in the transverse colon and distal   descending colon.  The area near the tattoo was a reddened fold, much   smaller, had the appearance of some residual polyp.  We did snare the   prominent piece of this and again fulgurated the entire area with the    argon plasma coagulant.  The scope was withdrawn.  There were no polyps   seen in the sigmoid colon or rectum.  The patient tolerated the   procedure well and was monitored throughout.  There were no immediate   complications.      ASSESSMENT:   1. Polyps removed from the ascending colon by snare polypectomy.   2. Residual polyp at the area of a large flat polyp in the distal       descending colon, small piece snared and entire area fulgurated       with argon plasma coagulant.      PLAN:  Will avoid nonsteroidal products for a week.  We will see back in   the office in 2-3 months.  Will likely need  a repeat procedure in 6-12   months.                  ______________________________   Llana Aliment. Malon Kindle., M.D.            Waldron Session  D:  09/02/2007  T:  09/02/2007  Job:  [id number]      cc:   Vesta Mixer, M.D.   Fax: [phone]      Kari Baars, M.D.   Fax: [phone]      SP-Surgical Pathology - STATUS: Final  .                                         Perform Date: 29FAO13 00:01  Ordered By: Malon Kindle MD , Jolene Provost Date: 15May09 12:01  Facility: [facility]                              Department: CPATH  Service Report Text  [facility] [place]   [address] [place], [place] [address]   [phone]    REPORT OF SURGICAL PATHOLOGY    Case #: [id number]   Patient Name: Daniel Carey, Daniel Carey.   Office Chart Number: N/A    MRN: [id number]   Pathologist: H. Hollice Espy, MD   DOB/Age [birth year] (Age: 60) Gender: M   Date Taken: 09/02/2007   Date Received: 09/02/2007    FINAL DIAGNOSIS    ***MICROSCOPIC EXAMINATION AND DIAGNOSIS***    1. COLON, ASCENDING, POLYP, BIOPSY: TUBULAR ADENOMA. NO HIGH   GRADE DYSPLASIA OR MALIGNANCY IDENTIFIED. (TWO FRAGMENTS)    2. COLON, DISTAL DESCENDING, POLYP, BIOPSY: FINDINGS CONSISTENT   WITH HYPERPLASTIC POLYP.    COMMENT   1. There is adenomatous epithelium having a predominantly    tubular growth pattern consistent with a tubular adenoma if the   biopsy is representative of the entire lesion. No high grade   dysplasia or evidence of malignancy is identified. Clinical   correlation is recommended.    2. There are benign colorectal type glands that have a serrated   architecture consistent with a hyperplastic polyp(s). No   adenomatous changes or evidence of malignancy is identified.   (HCL:mj 09/05/07)    mj   Date Reported: 09/05/2007 H. Hollice Espy, MD   *** Electronically Signed Out By [facility] ***    Clinical information   History colon polyps (tc)    specimen(s) obtained   1: Colon, polyp(s), ascending   2: Colon, polyp(s), distal descending    Gross Description   1. Received in formalin are tan, soft tissue fragments that are   submitted in toto. Number: two.   Size: each 0.3 cm    2. Received in formalin are tan, soft tissue fragments that are   submitted in toto. Number: two.   Size: each 0.2 cm (GP:mw, 09/02/07)    mw/   Additional Information  HL7 RESULT STATUS : F  External IF Update Timestamp : 2007-09-02:12:02:00.000000

## 2010-05-20 NOTE — Assessment & Plan Note (Signed)
Summary: f/u polyps..em   History of Present Illness Visit Type: new patient Primary GI MD: Sheryn Bison MD FACP FAGA Primary Provider: Martha Clan, M.D. Chief Complaint: colon polyps  History of Present Illness:   Daniel Carey is self referred for followup of multiple colon polyps removed over the last 2 years by Dr. Carman Ching. His pulse and an adenomatous in nature but have been multiple and followup exam is been requested for May 2010. I did an initial colonoscopy on this patient in 2003 as a screening procedure and it was unremarkable. He is now changing physicians because of insurance problems. He denies any gastrointestinal complaints or hepatobiliary problems. Having regular bowel movements without abdominal pain, melena, or hematochezia.  His chronic acid reflux and had endoscopy a year ago was unremarkable. I previously had done endoscopy in August of 2003 which showed a rather prominent hiatal hernia. He takes daily AcipHex and is asymptomatic without chest pain or dysphagia. He does have coronary artery disease sent previous coronary artery stenting and is followed by Dr. Melburn Popper. He apparently could not tolerate Plavix therapy. He otherwise in good health except for hypertension and hyperlipidemia and is on multiple medications. Family history is noncontributory except for history of breast cancer his father.   GI Review of Systems      Denies abdominal pain, acid reflux, belching, bloating, chest pain, dysphagia with liquids, dysphagia with solids, heartburn, loss of appetite, nausea, vomiting, vomiting blood, weight loss, and  weight gain.        Denies anal fissure, black tarry stools, change in bowel habit, constipation, diarrhea, diverticulosis, fecal incontinence, heme positive stool, hemorrhoids, irritable bowel syndrome, jaundice, light color stool, liver problems, rectal bleeding, and  rectal pain.     Prior Medications Reviewed Using: List Brought by Patient   Updated Prior Medication List: CRESTOR 20 MG TABS (ROSUVASTATIN CALCIUM) 1 tablet by mouth once daily ASPIRIN 81 MG TBEC (ASPIRIN) 1 tablet by mouth once daily METOPROLOL TARTRATE 100 MG  TABS (METOPROLOL TARTRATE) 1 tablet by mouth once daily ACIPHEX 20 MG  TBEC (RABEPRAZOLE SODIUM) 1 tablet by mouth once daily LISINOPRIL-HYDROCHLOROTHIAZIDE 20-25 MG  TABS (LISINOPRIL-HYDROCHLOROTHIAZIDE) one by mouth once daily  Current Allergies (reviewed today): ! NORVASC  Past Medical History:    Reviewed history from 12/22/2007 and no changes required:       Tobacco Use       COPD       Coronary artery disease       GERD       Hyperlipidemia       Hypertension       Myocardial infarction, hx of       Nephrolithiasis, hx of       Headache       ED              Current Problems:        HIATAL HERNIA (ICD-553.3)       HEMORRHOIDS, EXTERNAL (ICD-455.3)       HEADACHE (ICD-784.0)       FAMILY HISTORY DIABETES 1ST DEGREE RELATIVE (ICD-V18.0)       FAMILY HISTORY BREAST CANCER 1ST DEGREE RELATIVE <50 (ICD-V16.3)       NEPHROLITHIASIS, HX OF (ICD-V13.01)       MYOCARDIAL INFARCTION, HX OF (ICD-412)       HYPERTENSION (ICD-401.9)       HYPERLIPIDEMIA (ICD-272.4)       GERD (ICD-530.81)       CORONARY ARTERY DISEASE (ICD-414.00)  COPD (ICD-496)         Past Surgical History:    Reviewed history from 12/30/2006 and no changes required:       Angioplasty x3   Family History:    Reviewed history from 12/30/2006 and no changes required:       Family History Breast cancer 1st degree relative <50       Family History Diabetes 1st degree relative       Family History of Cardiovascular disorder       No FH of Colon Cancer:  Social History:    Reviewed history from 12/30/2006 and no changes required:       Occupation:       Married       Current Smoker       Alcohol use-yes       Daily Caffeine Use   Risk Factors:  Caffeine use:  3 drinks per day   Review of Systems   The patient denies allergy/sinus, anemia, anxiety-new, arthritis/joint pain, back pain, blood in urine, breast changes/lumps, change in vision, confusion, cough, coughing up blood, depression-new, fainting, fatigue, fever, headaches-new, hearing problems, heart murmur, heart rhythm changes, itching, menstrual pain, muscle pains/cramps, night sweats, nosebleeds, pregnancy symptoms, shortness of breath, skin rash, sleeping problems, sore throat, swelling of feet/legs, swollen lymph glands, thirst - excessive, urination - excessive, urination changes/pain, urine leakage, vision changes, and voice change.         He has noticed some swelling in his right breast which is being followed by Dr. Clelia Croft his primary care physician.He has had no discharge or discoloration of this area. Does have some osteoarthritis but denies frequent NSAID use.He continues to smoke a pack a day of cigarettes but denies significant pulmonary symptomatology.   Vital Signs:  Patient Profile:   60 Years Old Male Height:     69.5 inches Weight:      198.50 pounds BMI:     29.00 BSA:     2.07 Pulse rate:   72 / minute Pulse rhythm:   regular BP sitting:   110 / 76  (left arm)  Vitals Entered By: Milford Cage CMA (December 27, 2007 1:36 PM)                  Physical Exam  General:     Well developed, well nourished, no acute distress.healthy appearing.   Head:     Normocephalic and atraumatic. Eyes:     PERRLA, no icterus.exam deferred to patient's ophthalmologist.   Chest Wall:     Symmetrical,  no deformities . Breasts:     no masses, tenderness or gynecomastia noted. Lungs:     Clear throughout to auscultation. Heart:     Regular rate and rhythm; no murmurs, rubs,  or bruits. Abdomen:     Soft, nontender and nondistended. No masses, hepatosplenomegaly or hernias noted. Normal bowel sounds. Extremities:     No clubbing, cyanosis, edema or deformities noted. Neurologic:      Alert and  oriented x4;  grossly normal neurologically. Psych:     Alert and cooperative. Normal mood and affect.    Impression & Recommendations:  Problem # 1:  PERSONAL HX COLONIC POLYPS (ICD-V12.72) Assessment: Unchanged He has had multiple recurrent polyps 2 recent flat right colon polyps excised by Dr. Randa Evens. I would agree a followup exam in May of 2010 is indicated. He can hold his salicylate therapy before this procedure as he has been in the past.  Problem # 2:  GERD (ICD-530.81) Assessment: Improved continue AcipHex 20 mg 30 minutes before the first meal of the day the standard antireflux maneuvers. He does not need additional endoscopic exams.  Problem # 3:  HYPERTENSION (ICD-401.9) Assessment: Unchanged continue medications as per Dr. Clelia Croft and Dr.  Melburn Popper.   Patient Instructions: 1)  Copy Sent To:Dr. Buren Kos and Dr. Laqueta Carina...  2)  Please Continue current medications. 3)  Colonoscopy recall exam in May at 2010. 4)  Avoid foods high in acid content (tomatoes, citrus juices, spicy foods). Avoid eating within 3 to 4 hours of lying down or before exercising. Do not over eat; try smaller more frequent meals. Elevate head of bed four inches when sleeping.    Prescriptions: ACIPHEX 20 MG  TBEC (RABEPRAZOLE SODIUM) 1 tablet by mouth once daily  #30 x 11   Entered by:   Harlow Mares CMA   Authorized by:   Mardella Layman MD FACG,FAGA   Signed by:   Harlow Mares CMA on 12/27/2007   Method used:   Faxed to ...       Hospital doctor (mail-order)       [address]       [place], [place]  [address]       Ph: [phone] or [phone]       Fax: [phone]   RxID:   [id number]  ]

## 2010-05-20 NOTE — Assessment & Plan Note (Signed)
Summary: PER PT SODIUM FU---D/T--STC   Vital Signs:  Patient profile:   60 year old male Height:      69.5 inches Weight:      199 pounds BMI:     29.07 O2 Sat:      96 % on Room air Temp:     98.0 degrees F oral Pulse rate:   76 / minute Pulse rhythm:   regular Resp:     16 per minute BP sitting:   120 / 78  (left arm) Cuff size:   large  Vitals Entered By: Bill Salinas CMA (March 10, 2010 2:08 PM)  Nutrition Counseling: Patient's BMI is greater than 25 and therefore counseled on weight management options.  O2 Flow:  Room air CC: office visit to discuss sodium/ ab Is Patient Diabetic? No Pain Assessment Patient in pain? no        Primary Care Provider:  Etta Grandchild MD  CC:  office visit to discuss sodium/ ab.  History of Present Illness: He returns for f/up and he tells me that he is feeling much better since he stopped taking HCTZ.  Preventive Screening-Counseling & Management  Alcohol-Tobacco     Alcohol drinks/day: <1     Alcohol type: beer     >5/day in last 3 mos: no     Alcohol Counseling: not indicated; use of alcohol is not excessive or problematic     Feels need to cut down: no     Feels annoyed by complaints: no     Feels guilty re: drinking: no     Needs 'eye opener' in am: no     Smoking Status: current     Smoking Cessation Counseling: yes     Smoke Cessation Stage: ready     Packs/Day: 0.5     Year Started: 1971     Pack years: 105     Tobacco Counseling: to quit use of tobacco products  Hep-HIV-STD-Contraception     Hepatitis Risk: no risk noted     HIV Risk: no risk noted     STD Risk: no risk noted     Dental Visit-last 6 months yes     Dental Care Counseling: to seek dental care; no dental care within six months     TSE monthly: yes     Testicular SE Education/Counseling to perform regular STE     Sun Exposure-Excessive: no     Sun Exposure Counseling: to decrease sun exposure      Sexual History:  currently monogamous.        Drug Use:  never.        Blood Transfusions:  no.    Clinical Review Panels:  Prevention   Last Colonoscopy:  DONE (03/06/2009)   Last PSA:  0.47 (02/13/2010)  Immunizations   Last Tetanus Booster:  Historical (04/20/2001)   Last Flu Vaccine:  Historical (02/18/2005)  Lipid Management   Cholesterol:  164 (02/13/2010)   LDL (bad choesterol):  88 (02/13/2010)   HDL (good cholesterol):  40.00 (02/13/2010)  Diabetes Management   Creatinine:  1.2 (02/13/2010)   Last Flu Vaccine:  Historical (02/18/2005)  CBC   WBC:  9.7 (02/13/2010)   RBC:  4.66 (02/13/2010)   Hgb:  15.4 (02/13/2010)   Hct:  44.4 (02/13/2010)   Platelets:  285.0 (02/13/2010)   MCV  95.2 (02/13/2010)   MCHC  34.6 (02/13/2010)   RDW  13.8 (02/13/2010)   PMN:  42.8 (02/13/2010)   Lymphs:  42.9 (02/13/2010)   Monos:  9.9 (02/13/2010)   Eosinophils:  3.8 (02/13/2010)   Basophil:  0.6 (02/13/2010)  Complete Metabolic Panel   Glucose:  87 (02/13/2010)   Sodium:  131 (02/13/2010)   Potassium:  4.6 (02/13/2010)   Chloride:  96 (02/13/2010)   CO2:  27 (02/13/2010)   BUN:  17 (02/13/2010)   Creatinine:  1.2 (02/13/2010)   Albumin:  3.9 (02/13/2010)   Total Protein:  7.1 (02/13/2010)   Calcium:  9.3 (02/13/2010)   Total Bili:  0.5 (02/13/2010)   Alk Phos:  58 (02/13/2010)   SGPT (ALT):  19 (02/13/2010)   SGOT (AST):  19 (02/13/2010)   Medications Prior to Update: 1)  Aspirin 81 Mg Tbec (Aspirin) .Marland Kitchen.. 1 Tablet By Mouth Once Daily 2)  Metoprolol Tartrate 100 Mg  Tabs (Metoprolol Tartrate) .Marland Kitchen.. 1 Tablet By Mouth Once Daily 3)  Aciphex 20 Mg  Tbec (Rabeprazole Sodium) .Marland Kitchen.. 1 Tablet By Mouth Once Daily 4)  Lisinopril 10 Mg Tabs (Lisinopril) .... Take 1 Tablet By Mouth Daily 5)  Trilipix 135 Mg Cpdr (Choline Fenofibrate) .... Take 1 Tablet By Mouth Once Daily 6)  Lipitor 80 Mg Tabs (Atorvastatin Calcium) .... One By Mouth Once Daily  Current Medications (verified): 1)  Aspirin 81 Mg Tbec (Aspirin) .Marland Kitchen.. 1  Tablet By Mouth Once Daily 2)  Metoprolol Tartrate 100 Mg  Tabs (Metoprolol Tartrate) .Marland Kitchen.. 1 Tablet By Mouth Once Daily 3)  Aciphex 20 Mg  Tbec (Rabeprazole Sodium) .Marland Kitchen.. 1 Tablet By Mouth Once Daily 4)  Lisinopril 10 Mg Tabs (Lisinopril) .... Take 1 Tablet By Mouth Daily 5)  Trilipix 135 Mg Cpdr (Choline Fenofibrate) .... Take 1 Tablet By Mouth Once Daily 6)  Lipitor 80 Mg Tabs (Atorvastatin Calcium) .... One By Mouth Once Daily  Allergies (verified): 1)  ! Norvasc 2)  ! Crestor (Rosuvastatin Calcium)  Past History:  Past Medical History: Last updated: 12/22/2007 Tobacco Use COPD Coronary artery disease GERD Hyperlipidemia Hypertension Myocardial infarction, hx of Nephrolithiasis, hx of Headache ED  Current Problems:  HIATAL HERNIA (ICD-553.3) HEMORRHOIDS, EXTERNAL (ICD-455.3) HEADACHE (ICD-784.0) FAMILY HISTORY DIABETES 1ST DEGREE RELATIVE (ICD-V18.0) FAMILY HISTORY BREAST CANCER 1ST DEGREE RELATIVE <50 (ICD-V16.3) NEPHROLITHIASIS, HX OF (ICD-V13.01) MYOCARDIAL INFARCTION, HX OF (ICD-412) HYPERTENSION (ICD-401.9) HYPERLIPIDEMIA (ICD-272.4) GERD (ICD-530.81) CORONARY ARTERY DISEASE (ICD-414.00) COPD (ICD-496)  Past Surgical History: Last updated: 12/27/2007 Angioplasty x3  Family History: Last updated: 12/27/2007 Family History Breast cancer 1st degree relative <50 Family History Diabetes 1st degree relative Family History of Cardiovascular disorder No FH of Colon Cancer:  Social History: Last updated: 02/13/2010 Occupation: Curator Married Current Smoker Alcohol use-yes Daily Caffeine Use  Risk Factors: Alcohol Use: <1 (03/10/2010) >5 drinks/d w/in last 3 months: no (03/10/2010) Caffeine Use: 3 (12/27/2007)  Risk Factors: Smoking Status: current (03/10/2010) Packs/Day: 0.5 (03/10/2010)  Family History: Reviewed history from 12/27/2007 and no changes required. Family History Breast cancer 1st degree relative <50 Family History Diabetes 1st  degree relative Family History of Cardiovascular disorder No FH of Colon Cancer:  Social History: Reviewed history from 02/13/2010 and no changes required. Occupation: Curator Married Current Smoker Alcohol use-yes Daily Caffeine Use  Review of Systems  The patient denies anorexia, fever, weight loss, weight gain, chest pain, dyspnea on exertion, peripheral edema, prolonged cough, headaches, hemoptysis, abdominal pain, muscle weakness, depression, and unusual weight change.   Endo:  Denies cold intolerance, excessive hunger, excessive thirst, excessive urination, heat intolerance, polyuria, and weight change.  Physical Exam  General:  Well developed, well nourished,  no acute distress.healthy appearing.  healthy appearing.   Head:  Normocephalic and atraumatic. Mouth:  good dentition, no gingival abnormalities, pharynx pink and moist, no erythema, no exudates, no posterior lymphoid hypertrophy, no pharyngeal crowing, and no lesions.   Neck:  supple, full ROM, no masses, no thyromegaly, no thyroid nodules or tenderness, no JVD, normal carotid upstroke, no carotid bruits, no cervical lymphadenopathy, and no neck tenderness.   Lungs:  normal respiratory effort, no intercostal retractions, no accessory muscle use, normal breath sounds, no dullness, no fremitus, no crackles, and no wheezes.   Heart:  normal rate, regular rhythm, no murmur, no gallop, no rub, and no JVD.   Abdomen:  soft, non-tender, normal bowel sounds, no distention, no masses, no guarding, no rigidity, no rebound tenderness, no abdominal hernia, no inguinal hernia, no hepatomegaly, and no splenomegaly.   Msk:  normal ROM, no joint tenderness, no joint swelling, no joint warmth, no redness over joints, no joint deformities, no joint instability, and no crepitation.   Pulses:  R and L carotid,radial,femoral,dorsalis pedis and posterior tibial pulses are full and equal bilaterally Extremities:  No clubbing, cyanosis, edema, or  deformity noted with normal full range of motion of all joints.   Neurologic:  No cranial nerve deficits noted. Station and gait are normal. Plantar reflexes are down-going bilaterally. DTRs are symmetrical throughout. Sensory, motor and coordinative functions appear intact. Skin:  turgor normal, no rashes, no suspicious lesions, no ecchymoses, no petechiae, no purpura, no ulcerations, no edema, excessive tan, and solar damage.   Cervical Nodes:  no anterior cervical adenopathy and no posterior cervical adenopathy.   Psych:  Cognition and judgment appear intact. Alert and cooperative with normal attention span and concentration. No apparent delusions, illusions, hallucinations   Impression & Recommendations:  Problem # 1:  HYPONATREMIA (ICD-276.1) Assessment New I think this was due to the HCTZ, will recheck Na+ level today and screen for adrenal insuff. with cortisol level as well since he is getting steroid shots with Dr. Charlann Boxer [facility]) Orders: Venipuncture (04540) TLB-BMP (Basic Metabolic Panel-BMET) (80048-METABOL) TLB-Cortisol (82533-CORT)  Problem # 2:  HYPERTENSION (ICD-401.9) Assessment: Improved  His updated medication list for this problem includes:    Metoprolol Tartrate 100 Mg Tabs (Metoprolol tartrate) .Marland Kitchen... 1 tablet by mouth once daily    Lisinopril 10 Mg Tabs (Lisinopril) .Marland Kitchen... Take 1 tablet by mouth daily  Orders: Venipuncture (98119) TLB-BMP (Basic Metabolic Panel-BMET) (80048-METABOL) TLB-Cortisol (82533-CORT)  BP today: 120/78 Prior BP: 102/64 (02/13/2010)  Prior 10 Yr Risk Heart Disease: N/A (02/13/2010)  Labs Reviewed: K+: 4.6 (02/13/2010) Creat: : 1.2 (02/13/2010)   Chol: 164 (02/13/2010)   HDL: 40.00 (02/13/2010)   LDL: 88 (02/13/2010)   TG: 179.0 (02/13/2010)  Complete Medication List: 1)  Aspirin 81 Mg Tbec (Aspirin) .Marland Kitchen.. 1 tablet by mouth once daily 2)  Metoprolol Tartrate 100 Mg Tabs (Metoprolol tartrate) .Marland Kitchen.. 1 tablet by mouth once daily 3)  Aciphex  20 Mg Tbec (Rabeprazole sodium) .Marland Kitchen.. 1 tablet by mouth once daily 4)  Lisinopril 10 Mg Tabs (Lisinopril) .... Take 1 tablet by mouth daily 5)  Trilipix 135 Mg Cpdr (Choline fenofibrate) .... Take 1 tablet by mouth once daily 6)  Lipitor 80 Mg Tabs (Atorvastatin calcium) .... One by mouth once daily  Other Orders: Tobacco use cessation intermediate 3-10 minutes (14782)  Patient Instructions: 1)  Please schedule a follow-up appointment in 3 months. 2)  Tobacco is very bad for your health and your loved ones! You Should stop smoking!. 3)  Stop Smoking Tips: Choose a Quit date. Cut down before the Quit date. decide what you will do as a substitute when you feel the urge to smoke(gum,toothpick,exercise). 4)  It is important that you exercise regularly at least 20 minutes 5 times a week. If you develop chest pain, have severe difficulty breathing, or feel very tired , stop exercising immediately and seek medical attention. 5)  You need to lose weight. Consider a lower calorie diet and regular exercise.  6)  Check your Blood Pressure regularly. If it is above 130/80: you should make an appointment.   Orders Added: 1)  Venipuncture [36415] 2)  TLB-BMP (Basic Metabolic Panel-BMET) [80048-METABOL] 3)  TLB-Cortisol [82533-CORT] 4)  Tobacco use cessation intermediate 3-10 minutes [99406] 5)  Est. Patient Level IV [81191]

## 2010-05-20 NOTE — Progress Notes (Signed)
  Phone Note Outgoing Call   Summary of Call: I spoke to pt and informed him of his low sodium level and asked him to stop taking hctz and he has agreed to do that Initial call taken by: Etta Grandchild MD,  February 14, 2010 7:43 AM  Follow-up for Phone Call       Follow-up by: Etta Grandchild MD,  February 14, 2010 7:41 AM

## 2010-05-20 NOTE — Procedures (Signed)
Summary: EGD DR. Randa Evens   EGD  Procedure date:  03/02/2007  Findings:      Location: [facility]    NAME:  Daniel Carey, Daniel Carey               ACCOUNT NO.:  [id number]      MEDICAL RECORD NO.:  [id number]          PATIENT TYPE:  AMB      LOCATION:  ENDO                         FACILITY:  [facility]      PHYSICIAN:  James L. Malon Kindle., M.D.DATE OF BIRTH:  05-18-1950      DATE OF PROCEDURE:  03/02/2007   DATE OF DISCHARGE:                                  OPERATIVE REPORT      OPERATION/PROCEDURE:  Esophagogastroduodenoscopy.      MEDICATIONS:  Cetacaine spray, fentanyl 50 mcg, Versed 6 mg IV.      SCOPE:  Pediatric Pentax scope.      INDICATIONS:  Patient with a long history of esophageal reflux.  We   attempted to perform an endoscopy in our unit with the adult scope.  He   was  unable to swallow and became exceedingly agitated. We were never   able to complete the procedure.  He is coming back at this time for a   colonoscopy and we felt we would reattempt the procedure with the   pediatric scope here at the hospital.  The reason for this is chronic   reflux symptoms, to rule out Barrett's esophagus.      DESCRIPTION OF PROCEDURE:  The procedure had been explained to the   patient and consent had been obtained.  At the beginning of the   procedure, we gave the patient only 1 or 2 mg of Versed and sprayed his   throat since he had become so agitated prior. He easily swallowed the   pediatric scope with no difficulty.  The stomach was entered.  Pylorus   identified and passed the duodenum including the bulb and second portion   which were unremarkable.  The scope was withdrawn back in the stomach.   The duodenal bulb and pyloric channel were normal.  The antrum and body   of the stomach were normal.  There was a small hiatal hernia with a   widely patent GE junction with  free reflux.  The distal esophagus was    slightly reddened.  There were no erosions and no signs at all of   Barrett's esophagus.  There was a very distinct Z-line.  The scope was   withdrawn.  The patient tolerated the procedure well.      ASSESSMENT:  Esophageal reflux with no signs of Barrett's esophagus.      PLAN:  Will continue him on his current reflux medication, i.e. Aciphex,   and will follow him back in the office in several months.                  ______________________________   Llana Aliment. Malon Kindle., M.D.            Waldron Session  D:  03/02/2007  T:  03/03/2007  Job:  [id number]      cc:   Robert Bellow  Clelia Croft, M.D.   Fax: [phone]      Vesta Mixer, M.D.   Fax: [phone]

## 2010-05-20 NOTE — Procedures (Signed)
Summary: Colonoscopy  Patient: Daniel Carey Note: All result statuses are Final unless otherwise noted.  Tests: (1) Colonoscopy (COL)   COL Colonoscopy           DONE     [facility]     [address]     [place], [place]  [address]           COLONOSCOPY PROCEDURE REPORT           PATIENT:  Daniel Carey, Daniel Carey  MR#:  [id number]     BIRTHDATE:  [birth year], 58 yrs. old  GENDER:  male           ENDOSCOPIST:  Vania Rea. Jarold Motto, MD, [facility]     Referred by:           PROCEDURE DATE:  03/06/2009     PROCEDURE:  Colonoscopy with hot biopsy, Colonoscopy with snare     polypectomy     ASA CLASS:  Class II     INDICATIONS:  history of pre-cancerous (adenomatous) colon polyps                 MEDICATIONS:   Fentanyl 50 mcg IV, Versed 6 mg IV           DESCRIPTION OF PROCEDURE:   After the risks benefits and     alternatives of the procedure were thoroughly explained, informed     consent was obtained.  Digital rectal exam was performed and     revealed no abnormalities.   The LB [id number] endoscope     was introduced through the anus and advanced to the cecum, which     was identified by both the appendix and ileocecal valve, without     limitations.  The quality of the prep was excellent, using     MoviPrep.  The instrument was then slowly withdrawn as the colon     was fully examined.     <<PROCEDUREIMAGES>>           FINDINGS:  A sessile polyp was found at the hepatic flexure.     LINEAR FLAR POLYP PIECEMEAL EXCISED AND "PAINTBRUSHED" REMOVED     WITH HOT BX. FORCEPS.  A sessile polyp was found at the splenic     flexure. It was likely benign. Polyp was snared, then cauterized     with monopolar cautery. Retrieval was successful. snare polyp JAR     #2.  A diminutive polyp was found in the rectum. JAR#3.  This was     otherwise a normal examination of the colon.   Retroflexed views     in the rectum revealed no abnormalities.    The scope was then      withdrawn from the patient and the procedure completed.           COMPLICATIONS:  None           ENDOSCOPIC IMPRESSION:     1) Sessile polyp at the hepatic flexure     2) Sessile polyp at the splenic flexure     3) Diminutive polyp in the rectum     4) Normal colonoscopy otherwise     RECOMMENDATIONS:     1) If the polyp(s) removed today are proven to be adenomatous     (pre-cancerous) polyps, you will need a colonoscopy in 3 years.     Otherwise you should continue to follow colorectal cancer     screening guidelines for "routine risk" patients with a  colonoscopy in 10 years.           REPEAT EXAM:  No           ______________________________     Vania Rea. Jarold Motto, MD, Clement[facility] Graham           CC:  Pecola Lawless, MD           n.     Rosalie DoctorMarland Kitchen   Vania Rea. Ayani Ospina at 03/06/2009 11:38 AM           Joaquin Music, [id number]  Note: An exclamation mark (!) indicates a result that was not dispersed into the flowsheet. Document Creation Date: 03/06/2009 4:06 PM _______________________________________________________________________  (1) Order result status: Final Collection or observation date-time: 03/06/2009 11:22 Requested date-time:  Receipt date-time:  Reported date-time:  Referring Physician:   Ordering Physician: Sheryn Bison [id number]) Specimen Source:  Source: Launa Grill Order Number: [id number] Lab site:   Appended Document: Colonoscopy     Procedures Next Due Date:    Colonoscopy: 02/2012

## 2010-05-20 NOTE — Letter (Signed)
Summary: Results Follow-up Letter  [facility]  [address] [place], [place] [address]   Phone: [phone]  Fax: [phone]    02/14/2010  [address] [place], [place]  [address]  Dear Mr. HENTON,   The following are the results of your recent test(s):  Test     Result     Sodium level   low Liver/kidney   normal Urine       normal Thyroid     normal Prostate     normal CBC       normal   _________________________________________________________  Please call for an appointment in 2-3 weeks to recheck your sodium level _________________________________________________________ _________________________________________________________ _________________________________________________________  Sincerely,  Sanda Linger MD [facility]

## 2010-05-20 NOTE — Letter (Signed)
Summary: Lipid Letter  [facility]  [address] [place], [place] [address]   Phone: [phone]  Fax: [phone]    02/14/2010  Donold Marotto [address] [place], [place]  [address]  Dear Annice Pih:  We have carefully reviewed your last lipid profile from 02/13/2010 and the results are noted below with a summary of recommendations for lipid management.    Cholesterol:       164     Goal: <200   HDL "good" Cholesterol:   57.84     Goal: >40   LDL "bad" Cholesterol:   88     Goal: <70   Triglycerides:       179.0     Goal: <150        TLC Diet (Therapeutic Lifestyle Change): Saturated Fats & Transfatty acids should be kept < 7% of total calories ***Reduce Saturated Fats Polyunstaurated Fat can be up to 10% of total calories Monounsaturated Fat Fat can be up to 20% of total calories Total Fat should be no greater than 25-35% of total calories Carbohydrates should be 50-60% of total calories Protein should be approximately 15% of total calories Fiber should be at least 20-30 grams a day ***Increased fiber may help lower LDL Total Cholesterol should be < 200mg /day Consider adding plant stanol/sterols to diet (example: Benacol spread) ***A higher intake of unsaturated fat may reduce Triglycerides and Increase HDL    Adjunctive Measures (may lower LIPIDS and reduce risk of Heart Attack) include: Aerobic Exercise (20-30 minutes 3-4 times a week) Limit Alcohol Consumption Weight Reduction Aspirin 75-81 mg a day by mouth (if not allergic or contraindicated) Dietary Fiber 20-30 grams a day by mouth     Current Medications: 1)    Aspirin 81 Mg Tbec (Aspirin) .Marland Kitchen.. 1 tablet by mouth once daily 2)    Metoprolol Tartrate 100 Mg  Tabs (Metoprolol tartrate) .Marland Kitchen.. 1 tablet by mouth once daily 3)    Aciphex 20 Mg  Tbec (Rabeprazole sodium) .Marland Kitchen.. 1 tablet by mouth once daily 4)    Lisinopril 10 Mg Tabs (Lisinopril) .... Take 1 tablet by mouth daily 5)    Hydrochlorothiazide 25  Mg Tabs (Hydrochlorothiazide) .... Take 1/2 tablet by mouth once daily 6)    Trilipix 135 Mg Cpdr (Choline fenofibrate) .... Take 1 tablet by mouth once daily 7)    Moviprep 100 Gm  Solr (Peg-kcl-nacl-nasulf-na asc-c) .... As per prep instructions. 8)    Lipitor 80 Mg Tabs (Atorvastatin calcium) .... One by mouth once daily  If you have any questions, please call. We appreciate being able to work with you.   Sincerely,    [facility] Etta Grandchild MD  Appended Document: Lipid Letter Pt informed to expect letter in mail

## 2010-05-20 NOTE — Assessment & Plan Note (Signed)
Summary: F-UP AND MED REFILL/YF   History of Present Illness Visit Type: Follow-up Visit Primary GI MD: Sheryn Bison MD FACP FAGA Primary Provider: Martha Clan, M.D. Chief Complaint: Yearly check-up & med refills History of Present Illness:   60 year old Caucasian male who has had chronic acid reflux doing well on daily AcipHex. He denies current dysphagia problems. He has coronary artery disease, hypertension, and hyperlipidemia. He has a long history recurrent colon polyps with removal last year on several flat adenomatous polyps. His history of recurrent polyps in his back many years, he previously was a patient of Dr. Carman Ching. He has had 2 endoscopic exams which have not shown evidence of Barrett's mucosa.  Currently his bowels are moving well and denies melena or hematochezia or any systemic complaints. Is on daily aspirin but in the past cannot tolerate Plavix.   GI Review of Systems      Denies abdominal pain, acid reflux, belching, bloating, chest pain, dysphagia with liquids, dysphagia with solids, heartburn, loss of appetite, nausea, vomiting, vomiting blood, weight loss, and  weight gain.        Denies anal fissure, black tarry stools, change in bowel habit, constipation, diarrhea, diverticulosis, fecal incontinence, heme positive stool, hemorrhoids, irritable bowel syndrome, jaundice, light color stool, liver problems, rectal bleeding, and  rectal pain.    Current Medications (verified): 1)  Crestor 20 Mg Tabs (Rosuvastatin Calcium) .Marland Kitchen.. 1 Tablet By Mouth Once Daily 2)  Aspirin 81 Mg Tbec (Aspirin) .Marland Kitchen.. 1 Tablet By Mouth Once Daily 3)  Metoprolol Tartrate 100 Mg  Tabs (Metoprolol Tartrate) .Marland Kitchen.. 1 Tablet By Mouth Once Daily 4)  Aciphex 20 Mg  Tbec (Rabeprazole Sodium) .Marland Kitchen.. 1 Tablet By Mouth Once Daily....must Have Office Visit Before Any More Refills> 5)  Lisinopril 10 Mg Tabs (Lisinopril) .... Take 1 Tablet By Mouth Daily 6)  Hydrochlorothiazide 25 Mg Tabs  (Hydrochlorothiazide) .... Take 1/2 Tablet By Mouth Once Daily 7)  Trilipix 135 Mg Cpdr (Choline Fenofibrate) .... Take 1 Tablet By Mouth Once Daily  Allergies (verified): 1)  ! Norvasc  Past History:  Past medical, surgical, family and social histories (including risk factors) reviewed for relevance to current acute and chronic problems.  Past Medical History: Reviewed history from 12/22/2007 and no changes required. 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 Angioplasty x3  Family History: Reviewed history from 12/27/2007 and no changes required. Family History Breast cancer 1st degree relative <50 Family History Diabetes 1st degree relative Family History of Cardiovascular disorder No FH of Colon Cancer:  Social History: Reviewed history from 12/27/2007 and no changes required. Occupation: Married Current Smoker Alcohol use-yes Daily Caffeine Use  Review of Systems       The patient complains of fatigue.  The patient denies allergy/sinus, anemia, anxiety-new, arthritis/joint pain, back pain, blood in urine, breast changes/lumps, change in vision, confusion, cough, coughing up blood, depression-new, fainting, fever, headaches-new, hearing problems, heart murmur, heart rhythm changes, itching, menstrual pain, muscle pains/cramps, night sweats, nosebleeds, pregnancy  symptoms, shortness of breath, skin rash, sleeping problems, sore throat, swelling of feet/legs, swollen lymph  glands, thirst - excessive , urination - excessive , urination changes/pain, urine leakage, vision changes, and voice change.    Vital Signs:  Patient profile:   60 year old male Height:      69.5 inches Weight:      198.13 pounds BMI:     28.94 Pulse rate:   68 / minute Pulse rhythm:   regular BP sitting:   130 / 70  (left arm) Cuff size:   regular  Vitals Entered By: June McMurray CMA Duncan Dull) (February 04, 2010 3:23 PM)  Physical Exam  General:  Well developed, well nourished, no acute distress.healthy appearing.  healthy appearing.   Head:  Normocephalic and atraumatic. Eyes:  PERRLA, no icterus.exam deferred to patient's ophthalmologist.  exam deferred to patient's ophthalmologist.   Lungs:  Clear throughout to auscultation. Heart:  Regular rate and rhythm; no murmurs, rubs,  or bruits. Abdomen:  Soft, nontender and nondistended. No masses, hepatosplenomegaly or hernias noted. Normal bowel sounds. Psych:  Alert and cooperative. Normal mood and affect.   Impression & Recommendations:  Problem # 1:  PERSONAL HX COLONIC POLYPS (ICD-V12.72) Assessment Unchanged  I reviewed his records in detail today, and it appears he has had multiple flat adenomatous polyps, and he needs closer followup, and I have scheduled colonoscopy at this time. We will continue aspirin therapy throughout his prep. He has requested change of primary care physicians and I will schedule him to see Dr. Yetta Barre in primary care.  Orders: Colonoscopy (Colon)  Problem # 2:  HYPERTENSION (ICD-401.9) Assessment: Improved continue other medications as listed and reviewed his record.  Problem # 3:  GERD (ICD-530.81) Assessment: Improved continue anti-reflex regime and daily AcipHex 20 mg a day.  Other Orders: Primary Care Referral (Primary)  Patient Instructions: 1)  Copy sent to : Sanda Linger,  MD 2)  [facility] Patient Information Guide given to patient.  3)  Colonoscopy and Flexible Sigmoidoscopy brochure given.  4)  Your prescription(s) have been sent to you pharmacy.  5)  Your procedure has been scheduled for 03/12/2010, please follow the seperate instructions.  6)  Your appt with Dr.Jones is scheduled for 02/12/2010 arrive at 3:45pm for a 4:00pm. 7)  The medication list was reviewed and reconciled.  All changed / newly prescribed medications were explained.  A complete medication list was provided to the patient / caregiver. Prescriptions: ACIPHEX 20 MG  TBEC (RABEPRAZOLE SODIUM) 1 tablet by mouth once daily  #30 x 11   Entered by:   Harlow Mares CMA (AAMA)   Authorized by:   Mardella Layman MD [facility]   Signed by:   Harlow Mares CMA (AAMA) on 02/04/2010   Method used:   Faxed to ...       Hospital doctor (retail)       [address]       [place], [place]  [address]       Ph: [phone] or [phone]       Fax: [phone]   RxID:   [id number] MOVIPREP 100 GM  SOLR (PEG-KCL-NACL-NASULF-NA ASC-C) As per prep instructions.  #1 x 0   Entered by:   Harlow Mares CMA (AAMA)   Authorized by:   Mardella Layman MD [facility]   Signed by:   Harlow Mares CMA (AAMA) on 02/04/2010   Method used:   Faxed to ...       Hospital doctor (retail)  [address]       [place], [place]  [address]       Ph: [phone] or [phone]       Fax: [phone]   RxID:   [id number]

## 2010-05-20 NOTE — Consult Note (Signed)
Summary: [facility]  [facility]   Imported By: Esmeralda Links D'jimraou 12/30/2007 15:06:46  _____________________________________________________________________  External Attachment:    Type:   Image     Comment:   External Document

## 2010-05-20 NOTE — Procedures (Signed)
Summary: EGD   EGD  Procedure date:  05/28/2004  Findings:      Location: [facility]    Patient Name: Daniel Carey, Daniel Carey MRN: [id number] Procedure Procedures: Panendoscopy (EGD) CPT: 43235.    with esophageal dilation. CPT: G9296129.  Personnel: Endoscopist: Vania Rea. Jarold Motto, MD.  Exam Location: Exam performed in Endoscopy Suite.  Patient Consent: Procedure, Alternatives, Risks and Benefits discussed, consent obtained,  Indications Symptoms: Dysphagia. Reflux symptoms  History  Current Medications: Patient is not currently taking Coumadin.  Pre-Exam Physical: Performed May 28, 2004  Cardio-pulmonary exam, Abdominal exam, Extremity exam, Mental status exam WNL.  Exam Exam Info: Maximum depth of insertion Duodenum, intended Duodenum. Patient position: on left side. Duration of exam: 20 minutes. Vocal cords visualized. Gastric retroflexion performed. Images taken. ASA Classification: I. Tolerance: excellent.  Sedation Meds: Cetacaine Spray 2 sprays given via bronchoscope. Fentanyl 75 mcg. given IV. Versed 5 mg. given IV.  Monitoring: BP and pulse monitoring done. Oximetry used. Supplemental O2 given at 2 Liters.  Fluoroscopy: Fluoroscopy was not used.  Instrument(s): PCF 140L. Serial [id number].   Findings - HIATAL HERNIA: Prolapsing, 6 cms. in length. ICD9: Hernia, Hiatal: 553.3.Comments: Large HH and no LES tone and free reflux noted.  - STRICTURE / STENOSIS: Distal Esophagus.   - Dilation: Distal Esophagus. Procedure was performed under Fluoroscopy. Savary dilator used, Diameter: 20 mm, No Resistance, No Heme present on extraction. 1  total dilators used. Patient tolerance excellent. Outcome: successful.  - Normal: Body to Duodenal 2nd Portion. Not Seen: Ulcer. Mucosal abnormality. AVM's. Foreign body.   Assessment  Diagnoses: 553.3: Hernia, Hiatal. Chronic GERD.   Events  Unplanned Intervention: No unplanned interventions were required.   Plans Medication(s): Continue current medications.  Patient Education: Patient given standard instructions for: Stenosis / Stricture.  Disposition: After procedure patient sent to recovery.  Scheduling: Follow-up prn. Clinic Visit, to Vania Rea. Jarold Motto, MD, around Jun 25, 2004.   Comments: Consider fundoplicatio surgery per his age and recurrent problems.   CC: Bruce H. Swords, MD  This report was created from the original endoscopy report, which was reviewed and signed by the above listed endoscopist.

## 2010-05-20 NOTE — Consult Note (Signed)
Summary: [facility]  [facility]   Imported By: Esmeralda Links D'jimraou 12/30/2007 15:09:09  _____________________________________________________________________  External Attachment:    Type:   Image     Comment:   External Document

## 2010-05-20 NOTE — Miscellaneous (Signed)
Summary: Carafate Rx  Clinical Lists Changes  Medications: Added new medication of CARAFATE 1 GM/10ML  SUSP (SUCRALFATE) Liquid Carafate PC and at bedtime for several days. One pt. Take 5 cc after meals and bedtime. Refill X2 - Signed Rx of CARAFATE 1 GM/10ML  SUSP (SUCRALFATE) Liquid Carafate PC and at bedtime for several days. One pt. Take 5 cc after meals and bedtime. Refill X2;  #1 pint x 2;  Signed;  Entered by: Doristine Church RN II;  Authorized by: Mardella Layman MD [facility];  Method used: [facility] [address] [phone]*, [address] [place], [place], [place]  [address], Ph: [phone] or [phone], Fax: [phone] Observations: Added new observation of ALLERGY REV: Done (03/12/2010 9:27)    Prescriptions: CARAFATE 1 GM/10ML  SUSP (SUCRALFATE) Liquid Carafate PC and at bedtime for several days. One pt. Take 5 cc after meals and bedtime. Refill X2  #1 pint x 2   Entered by:   Doristine Church RN II   Authorized by:   Mardella Layman MD [facility]   Signed by:   Doristine Church RN II on 03/12/2010   Method used:   [facility]ronically to        [facility] [address] [phone]* (retail)       [address]       [place], [place]  [address]       Ph: [phone] or [phone]       Fax: [phone]   RxID:   [id number]

## 2010-05-20 NOTE — Miscellaneous (Signed)
Summary: ROUTINE COL....EM  Clinical Lists Changes  Medications: Added new medication of MOVIPREP 100 GM  SOLR (PEG-KCL-NACL-NASULF-NA ASC-C) As directed - Signed Rx of MOVIPREP 100 GM  SOLR (PEG-KCL-NACL-NASULF-NA ASC-C) As directed;  #1 x 0;  Signed;  Entered by: Clide Cliff RN;  Authorized by: Mardella Layman MD [facility];  Method used: Printed then faxed to [facility], [phone] [address], [place], [place], [place]  [address], Ph: [phone] or [phone], Fax: [phone]    Prescriptions: MOVIPREP 100 GM  SOLR (PEG-KCL-NACL-NASULF-NA ASC-C) As directed  #1 x 0   Entered by:   Clide Cliff RN   Authorized by:   Mardella Layman MD [facility]   Signed by:   Clide Cliff RN on 02/18/2009   Method used:   Printed then faxed to ...       Hospital doctor (retail)       [address]       [place], [place]  [address]       Ph: [phone] or [phone]       Fax: [phone]   RxID:   [id number]

## 2010-05-22 NOTE — Op Note (Signed)
Summary: [facility]   Imported By: Sherian Rein 04/23/2010 09:51:51  _____________________________________________________________________  External Attachment:    Type:   Image     Comment:   External Document

## 2010-06-26 ENCOUNTER — Telehealth: Payer: Self-pay | Admitting: Internal Medicine

## 2010-07-01 NOTE — Progress Notes (Signed)
  Phone Note Refill Request Message from:  Fax from Pharmacy on June 26, 2010 2:58 PM  Refills Requested: Medication #1:  METOPROLOL TARTRATE 100 MG  TABS 1 tablet by mouth once daily Initial call taken by: Rock Nephew CMA,  June 26, 2010 2:58 PM    Prescriptions: METOPROLOL TARTRATE 100 MG  TABS (METOPROLOL TARTRATE) 1 tablet by mouth once daily  #30 x 6   Entered by:   Rock Nephew CMA   Authorized by:   Etta Grandchild MD   Signed by:   Rock Nephew CMA on 06/26/2010   Method used:   Faxed to ...       Hospital doctor (retail)       [address]       [place], [place]  [address]       Ph: [phone] or [phone]       Fax: [phone]   RxID:   [id number]

## 2010-08-06 ENCOUNTER — Ambulatory Visit: Payer: Self-pay | Admitting: Cardiovascular Disease

## 2010-08-07 ENCOUNTER — Other Ambulatory Visit: Payer: Self-pay | Admitting: *Deleted

## 2010-08-07 DIAGNOSIS — Z79899 Other long term (current) drug therapy: Secondary | ICD-10-CM

## 2010-08-07 DIAGNOSIS — E7800 Pure hypercholesterolemia, unspecified: Secondary | ICD-10-CM

## 2010-08-08 ENCOUNTER — Encounter: Payer: Self-pay | Admitting: Cardiovascular Disease

## 2010-08-08 ENCOUNTER — Other Ambulatory Visit: Payer: Self-pay | Admitting: *Deleted

## 2010-08-08 DIAGNOSIS — D689 Coagulation defect, unspecified: Secondary | ICD-10-CM

## 2010-08-08 DIAGNOSIS — E559 Vitamin D deficiency, unspecified: Secondary | ICD-10-CM

## 2010-08-11 ENCOUNTER — Other Ambulatory Visit (INDEPENDENT_AMBULATORY_CARE_PROVIDER_SITE_OTHER): Payer: BC Managed Care – PPO | Admitting: *Deleted

## 2010-08-11 ENCOUNTER — Encounter: Payer: Self-pay | Admitting: Cardiovascular Disease

## 2010-08-11 ENCOUNTER — Ambulatory Visit (INDEPENDENT_AMBULATORY_CARE_PROVIDER_SITE_OTHER): Payer: BC Managed Care – PPO | Admitting: Cardiovascular Disease

## 2010-08-11 DIAGNOSIS — E7800 Pure hypercholesterolemia, unspecified: Secondary | ICD-10-CM

## 2010-08-11 DIAGNOSIS — Z79899 Other long term (current) drug therapy: Secondary | ICD-10-CM

## 2010-08-11 DIAGNOSIS — E559 Vitamin D deficiency, unspecified: Secondary | ICD-10-CM

## 2010-08-11 DIAGNOSIS — I2510 Atherosclerotic heart disease of native coronary artery without angina pectoris: Secondary | ICD-10-CM

## 2010-08-11 DIAGNOSIS — E785 Hyperlipidemia, unspecified: Secondary | ICD-10-CM

## 2010-08-11 NOTE — Patient Instructions (Signed)
Labs tomorrow

## 2010-08-11 NOTE — Assessment & Plan Note (Signed)
Gearald presents with some atypical episodes of chest pain but other episodes that are somewhat more typical for angina. I've strongly encouraged him to stop smoking. I told him that we can't do much to present further coronary blockages as long as he smoking. At this point he does not need any additional therapy. He takes nitroglycerin on an as-needed basis and even then very rarely. I'll see him again in 6 months.

## 2010-08-11 NOTE — Progress Notes (Signed)
Daniel Carey Date of Birth  November 04, 1950 [facility] [address]     [address] [place], [place]  [address] [phone]  Fax  [phone]  History of Present Illness:  Occasional episodes of CP.  Does not take NTG.  Walking almost every day.  2-3 times a week.  Bothered by hip pain.  Daniel Carey is a middle-aged gentleman with a history of nonobstructive coronary artery disease. His last heart catheterization was in 2006 which revealed moderate coronary artery disease. He had a Myoview study in January 2011 and was normal.  He's had some occasional episodes of chest pain. These episodes are fairly atypical. He occasionally has some episodes of chest tightness. He has never had to take a nitroglycerin. He continues to smoke but states that he would like to stop smoking.  Current Outpatient Prescriptions on File Prior to Visit  Medication Sig Dispense Refill  . aspirin 81 MG tablet Take 81 mg by mouth daily.        . metoprolol (LOPRESSOR) 100 MG tablet Take 50 mg by mouth 2 (two) times daily.        . nitroGLYCERIN (NITROSTAT) 0.4 MG SL tablet Place 0.4 mg under the tongue every 5 (five) minutes as needed.        . RABEprazole (ACIPHEX) 20 MG tablet Take 20 mg by mouth daily.        . niacin (NIASPAN) 1000 MG CR tablet Take 1,000 mg by mouth at bedtime.        . rosuvastatin (CRESTOR) 20 MG tablet Take 20 mg by mouth daily.          Allergies  Allergen Reactions  . Amlodipine Besylate   . Lipitor (Atorvastatin Calcium)   . Rosuvastatin     REACTION: muscle aches  . Trilipix     Past Medical History  Diagnosis Date  . CAD (coronary artery disease)   . Chest pain   . HTN (hypertension)   . HLD (hyperlipidemia)   . GERD (gastroesophageal reflux disease)   . Hypertriglyceridemia     No past surgical history on file.  History  Smoking status  . Current Everyday Smoker -- 0.5 packs/day  Smokeless tobacco  . Not on file    History    Alcohol Use  . Yes    rare    Family History  Problem Relation Age of Onset  . Heart disease    . Hypertension    . Breast cancer    . Diabetes      Reviw of Systems:  Reviewed in the HPI.  All other systems are negative.  Physical Exam: BP 122/84  Pulse 66  Ht 5\' 11"  (1.803 m)  Wt 198 lb 6.4 oz (89.994 kg)  BMI 27.67 kg/m2 The patient is alert and oriented x 3.  The mood and affect are normal.  The skin is warm and dry.  Color is normal.  The HEENT exam reveals that the sclera are nonicteric.  The mucous membranes are moist.  The carotids are 2+ without bruits.  There is no thyromegaly.  There is no JVD.  The lungs are clear.  The chest wall is non tender.  The heart exam reveals a regular rate with a normal S1 and S2.  There are no murmurs, gallops, or rubs.  The PMI is not displaced.   Abdominal exam reveals good bowel sounds.  There is no guarding or rebound.  There is no hepatosplenomegaly or  tenderness.  There are no masses.  Exam of the legs reveal no clubbing, cyanosis, or edema.  The legs are without rashes.  The distal pulses are intact.  Cranial nerves II - XII are intact.  Motor and sensory functions are intact.  The gait is normal.  ECG: Normal sinus rhythm. He has no ST or T wave changes. Assessment / Plan:

## 2010-08-12 ENCOUNTER — Other Ambulatory Visit (INDEPENDENT_AMBULATORY_CARE_PROVIDER_SITE_OTHER): Payer: BC Managed Care – PPO | Admitting: *Deleted

## 2010-08-12 DIAGNOSIS — E785 Hyperlipidemia, unspecified: Secondary | ICD-10-CM

## 2010-08-12 DIAGNOSIS — E559 Vitamin D deficiency, unspecified: Secondary | ICD-10-CM

## 2010-08-12 DIAGNOSIS — D689 Coagulation defect, unspecified: Secondary | ICD-10-CM

## 2010-08-12 LAB — LIPID PANEL
Cholesterol: 184 mg/dL (ref 0–200)
HDL: 41.1 mg/dL (ref >39.00)
LDL Cholesterol: 103 mg/dL — ABNORMAL HIGH (ref 0–99)
Total CHOL/HDL Ratio: 4
Triglycerides: 200 mg/dL — ABNORMAL HIGH (ref 0.0–149.0)
VLDL: 40 mg/dL (ref 0.0–40.0)

## 2010-08-12 LAB — BASIC METABOLIC PANEL
BUN: 22 mg/dL (ref 6–23)
CO2: 28 mEq/L (ref 19–32)
Calcium: 9.8 mg/dL (ref 8.4–10.5)
Chloride: 103 mEq/L (ref 96–112)
Creatinine, Ser: 1.1 mg/dL (ref 0.4–1.5)
GFR: 69.94 mL/min (ref >60.00)
Glucose, Bld: 109 mg/dL — ABNORMAL HIGH (ref 70–99)
Potassium: 4.6 mEq/L (ref 3.5–5.1)
Sodium: 137 mEq/L (ref 135–145)

## 2010-08-12 LAB — HEPATIC FUNCTION PANEL
ALT: 22 U/L (ref 0–53)
AST: 18 U/L (ref 0–37)
Albumin: 3.8 g/dL (ref 3.5–5.2)
Alkaline Phosphatase: 58 U/L (ref 39–117)
Bilirubin, Direct: 0.1 mg/dL (ref 0.0–0.3)
Total Bilirubin: 0.9 mg/dL (ref 0.3–1.2)
Total Protein: 6.9 g/dL (ref 6.0–8.3)

## 2010-08-13 LAB — VITAMIN D 25 HYDROXY (VIT D DEFICIENCY, FRACTURES): Vit D, 25-Hydroxy: 29 ng/mL — ABNORMAL LOW (ref 30–89)

## 2010-08-14 ENCOUNTER — Telehealth: Payer: Self-pay | Admitting: *Deleted

## 2010-08-14 DIAGNOSIS — E785 Hyperlipidemia, unspecified: Secondary | ICD-10-CM

## 2010-08-14 NOTE — Telephone Encounter (Signed)
Message copied by Mahalia Longest on Thu Aug 14, 2010  3:16 PM ------      Message from: [place], [place]      Created: Thu Aug 14, 2010  1:13 PM       Increase Crestor to 40 mg a day.  Re check lipids, bmp, hfp in 3 months.

## 2010-08-14 NOTE — Telephone Encounter (Signed)
OK. Continue Lipator 80 QD since he did not tolerate crestor.

## 2010-08-14 NOTE — Telephone Encounter (Signed)
Pt set w 17mo lab draw, pt called and results mailed to him.Alfonso Ramus RN

## 2010-08-14 NOTE — Telephone Encounter (Signed)
Pt not on crestor/pt held, wasn't feeling well on med. On lipitor 80mg . Please advise.

## 2010-09-02 NOTE — Procedures (Signed)
DUPLEX DEEP VENOUS EXAM - LOWER EXTREMITY   INDICATION:  Left leg pain and swelling.   HISTORY:  Edema:  Left foot.  Trauma/Surgery:  No.  Pain:  Yes.  PE:  No.  Previous DVT:  No.  Anticoagulants:  No.  Other:  No.   DUPLEX EXAM:                CFV   SFV   PopV  PTV    GSV                R  L  R  L  R  L  R   L  R  L  Thrombosis    o  o     o     o      o     o  Spontaneous   +  +     +     +      +     +  Phasic        +  +     +     +      +     +  Augmentation  +  +     +     +      +     +  Compressible  +  +     +     +      +     +  Competent     +  +     +     +      +     +   Legend:  + - yes  o - no  p - partial  D - decreased   IMPRESSION:  There does not appear to be any deep vein thrombus noted in  the left leg.    _____________________________  Di Kindle. Edilia Bo, M.D.   CB/MEDQ  D:  08/22/2009  T:  08/22/2009  Job:  [id number]

## 2010-09-02 NOTE — Op Note (Signed)
NAMEDUAYNE, Daniel Carey               ACCOUNT NO.:  [id number]   MEDICAL RECORD NO.:  [id number]          PATIENT TYPE:  AMB   LOCATION:  ENDO                         FACILITY:  [facility]   PHYSICIAN:  James L. Malon Kindle., M.D.DATE OF BIRTH:  Apr 23, 1950   DATE OF PROCEDURE:  09/02/2007  DATE OF DISCHARGE:                               OPERATIVE REPORT   PROCEDURE:  Colonoscopy and polypectomy with fulguration with argon  plasma coagulator.   MEDICATIONS:  Fentanyl 100 mcg, Versed 10 mg IV, Phenergan 12.5 mg IV.   INDICATIONS FOR PROCEDURE:  The patient had removal of several  adenomatous polyps in the distal descending colon with one a very large  flat lesion.  It was biopsied and came back showing this to be evidence  of polyp.  The area was tattooed with a mL of [place] ink.  It was thought  by __________  this as much as possible was removed with a snare back in  November and the entire area was fulgurated with argon plasma  coagulator.  The procedure is done to re-evaluate this area.   DESCRIPTION OF PROCEDURE:  The procedure had been explained and the  patient consent obtained.  In the left lateral decubitus position the  Pentax pediatric scope was inserted and the prep was excellent.  When we  reached the cecum the cecum was identified clearly with the ileocecal  valve and appendiceal orifice.  The scope was withdrawn in the ascending  colon with two 0.5 cm flat polyps that were both removed with the snare  and put in a single jar.  No polyps in the transverse colon and distal  descending colon.  The area near the tattoo was a reddened fold, much  smaller, had the appearance of some residual polyp.  We did snare the  prominent piece of this and again fulgurated the entire area with the  argon plasma coagulant.  The scope was withdrawn.  There were no polyps  seen in the sigmoid colon or rectum.  The patient tolerated the  procedure well and was monitored throughout.  There were no  immediate  complications.   ASSESSMENT:  1. Polyps removed from the ascending colon by snare polypectomy.  2. Residual polyp at the area of a large flat polyp in the distal      descending colon, small piece snared and entire area fulgurated      with argon plasma coagulant.   PLAN:  Will avoid nonsteroidal products for a week.  We will see back in  the office in 2-3 months.  Will likely need a repeat procedure in 6-12  months.           ______________________________  Llana Aliment. Malon Kindle., M.D.     Waldron Session  D:  09/02/2007  T:  09/02/2007  Job:  [id number]   cc:   Vesta Mixer, M.D.  Fax: [phone]   Kari Baars, M.D.  Fax: [phone]

## 2010-09-02 NOTE — Op Note (Signed)
Daniel Carey, Daniel Carey               ACCOUNT NO.:  [id number]   MEDICAL RECORD NO.:  [id number]          PATIENT TYPE:  AMB   LOCATION:  ENDO                         FACILITY:  [facility]   PHYSICIAN:  James L. Malon Kindle., M.D.DATE OF BIRTH:  1950-07-17   DATE OF PROCEDURE:  03/02/2007  DATE OF DISCHARGE:                               OPERATIVE REPORT   PROCEDURE:  Colonoscopy with polypectomy and fulguration of polyp with  Argon coagulator.   MEDICATIONS:  The patient received a total of fentanyl 100 mcg and  Versed 8 mg for both procedures.   INDICATION:  Mr. Convey underwent screening colonoscopy in September  with removal of several adenomatous polyps.  In the distal descending  colon, was a very prominent erythematous pole that was biopsied with a  cold polyp.  Histology was concerning that this may be a polyp or even  an early tumor.  The area was tattooed with 1 mL of [place] ink.  It was  somewhat hard and friable.  The path on this came back revealing this to  be an adenomatous polyp with no high-grade dysplasia.  This procedure is  done to go back and reevaluate this in an attempt to remove with plans  to use an Argon plasma coagulator if appropriate.  All this has been  explained to the patient including the surgical option,etc., with he and  his wife in the office.   DESCRIPTION OF PROCEDURE:  The procedure had been explained to the  patient and consent obtained.  Left lateral decubitus position, the  Pentax adult colonoscope was inserted.  The prep was quite good.  Using  some abdominal pressure, we were able to reach the ileocecal valve.  The  appendiceal orifice was seen.  The scope was withdrawn, and the cecum,  ascending colon, transverse colon were seen well.  The distal descending  colon, a small 1/3 to 1-1/5 cm polyp was seen that had been missed on  the previous colonoscopy and was removed.  Just down from that a bit,  was the tattooed area.  We were able to clearly  see this with the polyp  and reddened folds seen.  Multiple pieces were removed with the snare.  I then used the ERBE Argon plasma coagulator with the circumferential  probe and fulgurated the whole base of this.  There was no active  bleeding with the termination of the procedure, and this was  photographed.  The scope was withdrawn.  There were no other polyps seen  in the remainder of the colon.  The patient tolerated the procedure  well, and there were no immediate complications.   ASSESSMENT:  Polyp removed from the distal descending colon.  This was a  flat polyp on a fold that hopefully that between the polypectomy and the  use of the Argon coagulator were able to remove or kill all the tissue.  We  will see what the path report looks like and very likely within the next  year or so will need to go back and reevaluate this area.  The patient  will be instructed  to watch for signs of perforation, bleeding, avoid  aspirin, etc.   PLAN:  Await path report to make further recommendations.           ______________________________  Llana Aliment Malon Kindle., M.D.     Waldron Session  D:  03/02/2007  T:  03/03/2007  Job:  [id number]   cc:   Kari Baars, M.D.  Fax: [phone]   Vesta Mixer, M.D.  Fax: [phone]

## 2010-09-02 NOTE — Op Note (Signed)
NAMEGEN, CLAGG               ACCOUNT NO.:  [id number]   MEDICAL RECORD NO.:  [id number]          PATIENT TYPE:  AMB   LOCATION:  ENDO                         FACILITY:  [facility]   PHYSICIAN:  James L. Malon Kindle., M.D.DATE OF BIRTH:  [birth year]   DATE OF PROCEDURE:  03/02/2007  DATE OF DISCHARGE:                               OPERATIVE REPORT   OPERATION/PROCEDURE:  Esophagogastroduodenoscopy.   MEDICATIONS:  Cetacaine spray, fentanyl 50 mcg, Versed 6 mg IV.   SCOPE:  Pediatric Pentax scope.   INDICATIONS:  Patient with a long history of esophageal reflux.  We  attempted to perform an endoscopy in our unit with the adult scope.  He  was  unable to swallow and became exceedingly agitated. We were never  able to complete the procedure.  He is coming back at this time for a  colonoscopy and we felt we would reattempt the procedure with the  pediatric scope here at the hospital.  The reason for this is chronic  reflux symptoms, to rule out Barrett's esophagus.   DESCRIPTION OF PROCEDURE:  The procedure had been explained to the  patient and consent had been obtained.  At the beginning of the  procedure, we gave the patient only 1 or 2 mg of Versed and sprayed his  throat since he had become so agitated prior. He easily swallowed the  pediatric scope with no difficulty.  The stomach was entered.  Pylorus  identified and passed the duodenum including the bulb and second portion  which were unremarkable.  The scope was withdrawn back in the stomach.  The duodenal bulb and pyloric channel were normal.  The antrum and body  of the stomach were normal.  There was a small hiatal hernia with a  widely patent GE junction with  free reflux.  The distal esophagus was  slightly reddened.  There were no erosions and no signs at all of  Barrett's esophagus.  There was a very distinct Z-line.  The scope was  withdrawn.  The patient tolerated the procedure well.   ASSESSMENT:  Esophageal reflux  with no signs of Barrett's esophagus.   PLAN:  Will continue him on his current reflux medication, i.e. Aciphex,  and will follow him back in the office in several months.           ______________________________  Llana Aliment. Malon Kindle., M.D.     Waldron Session  D:  03/02/2007  T:  03/03/2007  Job:  [id number]   cc:   Kari Baars, M.D.  Fax: [phone]   Vesta Mixer, M.D.  Fax: [phone]

## 2010-09-02 NOTE — Procedures (Signed)
DUPLEX DEEP VENOUS EXAM - LOWER EXTREMITY   INDICATION:  Left lower extremity pain and left groin pain.   HISTORY:  Edema:  No.  Trauma/Surgery:  No.  Pain:  Yes.  PE:  No.  Previous DVT:  No.  Anticoagulants:  None.  Other:   DUPLEX EXAM:                CFV   SFV   PopV  PTV    GSV                R  L  R  L  R  L  R   L  R  L  Thrombosis    o  o     o     o      o     o  Spontaneous   +  +     +     +      +     +  Phasic        +  +     +     +      +     +  Augmentation  +  +     +     +      +     +  Compressible  +  +     +     +      +     +  Competent     +  +     +     +      +     +   Legend:  + - yes  o - no  p - partial  D - decreased   IMPRESSION:  1. No evidence of deep venous thrombosis noted in the left leg.  2. Questionable fluid collection noted in the left groin region      anterior to the femoral bone head.    _____________________________  Janetta Hora. Fields, MD   MG/MEDQ  D:  12/09/2007  T:  12/09/2007  Job:  [id number]

## 2010-09-05 NOTE — H&P (Signed)
NAME:  LOUDEN, HOUSEWORTH NO.:  [id number]   MEDICAL RECORD NO.:  [id number]            PATIENT TYPE:   LOCATION:                                 FACILITY:   PHYSICIAN:  Vesta Mixer, M.D.      DATE OF BIRTH:   DATE OF ADMISSION:  DATE OF DISCHARGE:                                HISTORY & PHYSICAL   Mr. Goodenow is a middle-aged gentleman with a history of known coronary  artery disease.  He has moderate to severe distal coronary artery disease.  His large epicardial arteries have only mild to moderate irregularities.  His last catheterization was approximately six years ago.  He has done  fairly well until recently.  Recently he started having more frequent  episodes of chest pain.  He has also had some problems with headaches.  He  says that he gets chest pains both with rest and exertion.   He has not had any episodes of syncope or presyncope.  He denies any PND or  orthopnea.   CURRENT MEDICATIONS:  1.  Metoprolol 25 mg p.o. b.i.d.  2.  Lisinopril/HCT 20/25 mg once a day.  3.  Lipitor 80 mg a day.  4.  Aciphex 20 mg a day.  5.  Enteric-coated aspirin 325 mg a day.  6.  Niacin over-the-counter once a day.   ALLERGIES:  None.   PAST MEDICAL HISTORY:  1.  Coronary artery disease.  2.  Hyperlipidemia.  3.  Hypertension.   SOCIAL HISTORY:  The patient continues to smoke a pack and a half of  cigarettes a day.   FAMILY HISTORY:  Unremarkable.   PHYSICAL EXAMINATION:  GENERAL:  He is a middle-aged gentleman in no acute  distress.  He is alert and oriented x3 and his mood and affect are normal.  VITAL SIGNS:  Weight 193, blood pressure 120/84 with a heart rate of 60.  HEENT:  2+ carotids.  He has no bruits.  No JVD.  No thyromegaly.  LUNGS:  Clear to auscultation.  HEART:  Regular rate.  S1, S2.  He has no murmurs.  ABDOMEN:  Good bowel sounds, nontender.  EXTREMITIES:  He has no clubbing, cyanosis, edema.  NEUROLOGIC:  Nonfocal.   Daishon presents  with worsening episodes of chest pain.  Unfortunately, he is  still smoking.  We will schedule him for a heart catheterization for further  evaluation of these chest pains.  We have discussed the risks, benefits, and  options of heart catheterization.  He understands and agrees to proceed.           ______________________________  Vesta Mixer, M.D.     PJN/MEDQ  D:  03/31/2005  T:  03/31/2005  Job:  [id number]

## 2010-09-05 NOTE — Cardiovascular Report (Signed)
NAMEURHO, RIO NO.:  [id number]   MEDICAL RECORD NO.:  [id number]          PATIENT TYPE:  OIB   LOCATION:  1962                         FACILITY:  [facility]   PHYSICIAN:  Vesta Mixer, M.D. DATE OF BIRTH:  01/31/1951   DATE OF PROCEDURE:  04/03/2005  DATE OF DISCHARGE:  04/03/2005                              CARDIAC CATHETERIZATION   Daniel Carey is a 60 year old gentleman with a history of diffuse coronary artery  disease. He is referred for heart catheterization for further evaluation. He  has been having progressive angina.   The procedure was left heart catheterization with coronary angiography. The  right femoral artery was easily cannulated using modified Seldinger  technique.   HEMODYNAMIC RESULTS:  LV pressure was 108/10 with an aortic pressure of  107/62.   ANGIOGRAPHY:  The left main is moderately calcified. There are minor luminal  irregularities.   The left anterior descending artery is also moderately calcified. There is  moderate diffuse disease throughout the LAD between 30-40%. There may a mid-  lesion as high as 50%.   The diagonal vessels were quite small and diffusely diseased between 30-40%.   Left circumflex artery is a moderate-sized vessel. There is a diffuse  moderate coronary artery irregularities between 30-40%. The obtuse marginal  artery is very small with moderate disease.   The right coronary artery has a long 50% stenosis in the mid-segment. There  is an ulcerated plaque at the adjusted end of this mid-lesion. This plaque  is ulcerated but it does not appear to obstruct blood flow. It could have  been the site of transient thrombosis.   The posterior descending artery and posterolateral segment artery are fairly  small and diffusely diseased.   Left ventriculogram was performed in the 30 degree RAO position. It reveals  normal left ventricular systolic function. There is no mitral regurgitation.  The ejection fraction  is approximately 70%.   COMPLICATIONS:  None.   CONCLUSION:  1.  Mild to moderate diffuse coronary artery disease.  2.  Well-preserved left ventricular systolic function. We will continue with      medical therapy. The patient needs to quit smoking.          ______________________________  Vesta Mixer, M.D.    PJN/MEDQ  D:  04/03/2005  T:  04/04/2005  Job:  [id number]

## 2010-09-05 NOTE — Cardiovascular Report (Signed)
[place]  Patient:    Daniel Carey, Daniel Carey Visit Number: [id number] MRN: [id number]          Service Type: CAT Location: [facility] 2857 01 Attending Physician:  Koren Bound Dictated by:   Alvia Grove., M.D. Proc. Date: 03/11/01 Admit Date:  03/11/2001                          Cardiac Catheterization  DATE OF BIRTH: 15-Feb-1951  PROCEDURES: Left heart catheterization with coronary angiography.  INDICATIONS: The patient is a 60 year old gentleman with a history of coronary artery disease and previous strokes. He is admitted for repeat catheterization for evaluation of some worsening angina.  DESCRIPTION OF PROCEDURE: The right femoral artery was easily cannulated using a modified Seldinger technique.  HEMODYNAMICS: The left ventricular pressure is 138/21 with an aortic pressure of 137/83.  ANGIOGRAPHY: The left main coronary artery is relatively large. There are a few minor luminal irregularities.  The left anterior descending artery is a fairly large vessel. There are mild to moderate irregularities throughout the entire LAD. The proximal LAD has a 30-40% stenosis. The LAD gives off several diagonal branches which have moderate irregularities. The diagonal branches are quite small.  The left circumflex artery is a fairly large vessel. It gives off several obtuse marginal arteries which are fairly small and diffusely diseased. There is a 60-70% stenosis in the proximal aspect of the first obtuse marginal artery and 40-50% stenosis in the second obtuse marginal artery. The circumflex terminates in a posterolateral distribution.  The right coronary artery is dominant and is moderate in size. It is diffusely diseased throughout its course. The proximal right coronary artery has diffuse 20-30% stenosis. The mid right coronary artery has a 50% stenosis. The remainder of the RCA has diffuse irregularities between 20 and 30%.  The posterior descending artery is a fairly small vessel and is approximately 1.5 mm in size. There is a 60-70% stenosis in the proximal aspect of the posterior descending artery. The posterolateral segment artery has mild luminal irregularities and is also a vessel of approximately 1.5 mm. It is otherwise unremarkable.  LEFT VENTRICULOGRAM: The left ventriculogram was performed in a 30 RAO position. It reveals overall mildly reduced left ventricular systolic function. There is lateral hypokinesis.  There is no mitral regurgitation.  COMPLICATIONS: None.  CONCLUSIONS: 1. Mild to moderate diffuse coronary artery irregularities primarily involving    the small branches. 2. Mildly depressed left ventricular systolic function. Dictated by:   Alvia Grove., M.D. Attending Physician:  Koren Bound DD:  03/11/01 TD:  03/11/01 Job: [id number]

## 2010-11-12 ENCOUNTER — Telehealth: Payer: Self-pay | Admitting: Cardiovascular Disease

## 2010-11-12 NOTE — Telephone Encounter (Signed)
Called to cancel her husbands LAB appointment for 11/13/10 and said that she would call 11/17/10 to reschedule.

## 2010-11-13 ENCOUNTER — Other Ambulatory Visit: Payer: BC Managed Care – PPO | Admitting: *Deleted

## 2010-11-20 ENCOUNTER — Encounter: Payer: Self-pay | Admitting: Internal Medicine

## 2010-11-20 ENCOUNTER — Ambulatory Visit (INDEPENDENT_AMBULATORY_CARE_PROVIDER_SITE_OTHER)
Admission: RE | Admit: 2010-11-20 | Discharge: 2010-11-20 | Disposition: A | Discharge status: Home / Self Care | Payer: BC Managed Care – PPO | Source: Ambulatory Visit | Attending: Internal Medicine | Admitting: Internal Medicine

## 2010-11-20 ENCOUNTER — Ambulatory Visit (INDEPENDENT_AMBULATORY_CARE_PROVIDER_SITE_OTHER): Payer: BC Managed Care – PPO | Admitting: Internal Medicine

## 2010-11-20 DIAGNOSIS — R05 Cough: Secondary | ICD-10-CM

## 2010-11-20 DIAGNOSIS — J019 Acute sinusitis, unspecified: Secondary | ICD-10-CM | POA: Insufficient documentation

## 2010-11-20 DIAGNOSIS — J449 Chronic obstructive pulmonary disease, unspecified: Secondary | ICD-10-CM

## 2010-11-20 DIAGNOSIS — R059 Cough, unspecified: Secondary | ICD-10-CM

## 2010-11-20 MED ORDER — CEFUROXIME AXETIL 500 MG PO TABS: 500.0000 mg | ORAL_TABLET | Freq: Two times a day (BID) | ORAL | Status: AC

## 2010-11-20 MED ORDER — TIOTROPIUM BROMIDE MONOHYDRATE 18 MCG IN CAPS: 18.0000 ug | ORAL_CAPSULE | Freq: Every day | RESPIRATORY_TRACT | Status: DC

## 2010-11-20 MED ORDER — PSEUDOEPH-CHLORPHEN-HYDROCOD 60-4-5 MG/5ML PO SOLN: 5.0000 mL | Freq: Four times a day (QID) | ORAL | Status: DC | PRN

## 2010-11-20 NOTE — Patient Instructions (Signed)
Chronic Obstructive Pulmonary Disease (COPD) Chronic obstructive pulmonary disease (COPD) is a condition in which airflow from the lungs is restricted. The lungs can never return to normal, but there are measures you can take which will improve them and make you feel better. CAUSES  Smoking.   Breathing in irritants (pollution, cigarette smoke, strong odors, aerosol sprays, paint fumes).   History of lung infections.  TREATMENT  Treatment focuses on making you comfortable (supportive care).  HOME CARE INSTRUCTIONS  If you smoke, stop smoking. The carbon monoxide buildup in the blood robs you of your already short oxygen supply.   Take medicines (antibiotics) that kill germs as directed.   Avoid antihistamines and cough syrups. They dry up your system and slow down the elimination of secretions. This decreases respiratory capacity and may lead to infections.   Drink enough water and fluids to keep your urine clear or pale yellow. This loosens secretions.   Use humidifiers at home and at your bedside if they do not make breathing difficult.   Receive all protective vaccines your caregiver suggests, especially pneumococcal and influenza.   Use home oxygen as suggested.  SEEK MEDICAL CARE IF:  You develop pus-like mucus (sputum).   You have an oral temperature above 100.5.   Breathing is more labored or exercise becomes difficult to do.   You are running out of the medicine you take for your breathing.  SEEK IMMEDIATE MEDICAL CARE IF:  You have a rapid heart rate.   You have agitation, confusion, tremors, or are in a stupor (family members may need to observe this).   It becomes difficult to breathe.   You develop chest pain.  You have an oral temperature above 100.5Sinusitis Sinuses are air pockets within the bones of your face. The growth of bacteria within a sinus leads to infection. Infection keeps the sinuses from draining. This infection is called  sinusitis. SYMPTOMS There will be different areas of pain depending on which sinuses have become infected. The maxillary sinuses often produce pain beneath the eyes.  Frontal sinusitis may cause pain in the middle of the forehead and above the eyes.  Other problems (symptoms) include: Toothaches.  Colored, pus-like (purulent) drainage from the nose.  Any swelling, warmth, or tenderness over the sinus areas may be signs of infection.  TREATMENT Sinusitis is most often determined by an exam and you may have x-rays taken. If x-rays have been taken, make sure you obtain your results. Or find out how you are to obtain them. Your caregiver may give you medications (antibiotics). These are medications that will help kill the infection. You may also be given a medication (decongestant) that helps to reduce sinus swelling.  HOME CARE INSTRUCTIONS Only take over-the-counter or prescription medicines for pain, discomfort, or fever as directed by your caregiver.  Drink extra fluids. Fluids help thin the mucus so your sinuses can drain more easily.  Applying either moist heat or ice packs to the sinus areas may help relieve discomfort.  Use saline nasal sprays to help moisten your sinuses. The sprays can be found at your local drugstore.  SEEK IMMEDIATE MEDICAL CARE IF YOU DEVELOP: High fever that is still present after two days of antibiotic treatment.  Increasing pain, severe headaches, or toothache.  Nausea, vomiting, or drowsiness.  Unusual swelling around the face or trouble seeing.  MAKE SURE YOU:  Understand these instructions.  Will watch your condition.  Will get help right away if you are not doing well or  get worse.  Document Released: 04/06/2005 Document Re-Released: 03/19/2008  [facility] Patient Information 2011 [place], [place]., not controlled by medicine.  MAKE SURE YOU:   Understand these instructions.   Will watch your condition.   Will get help right away if you are not doing  well or get worse.  Document Released: 01/14/2005 Document Re-Released: 07/01/2009 [facility] Patient Information 2011 [place], [place].

## 2010-11-21 ENCOUNTER — Encounter: Payer: Self-pay | Admitting: Internal Medicine

## 2010-11-21 NOTE — Progress Notes (Signed)
Subjective:    Patient ID: Daniel Carey, male    DOB: [birth year], 60 y.o.   MRN: [id number]  URI  This is a new problem. The current episode started 1 to 4 weeks ago. The problem has been gradually worsening. There has been no fever. Associated symptoms include congestion, coughing (productive of yellow phlegm), rhinorrhea, sneezing and a sore throat. Pertinent negatives include no abdominal pain, chest pain, dysuria, ear pain, headaches, joint pain, joint swelling, nausea, neck pain, plugged ear sensation, rash, sinus pain, swollen glands, vomiting or wheezing.  Cough This is a chronic problem. The current episode started more than 1 year ago. The problem has been unchanged. The problem occurs every few hours. The cough is productive of purulent sputum. Associated symptoms include nasal congestion, rhinorrhea and a sore throat. Pertinent negatives include no chest pain, chills, ear congestion, ear pain, eye redness, fever, headaches, heartburn, hemoptysis, myalgias, postnasal drip, rash, shortness of breath, sweats, weight loss or wheezing. Risk factors for lung disease include smoking/tobacco exposure. His past medical history is significant for COPD and emphysema. There is no history of asthma, bronchiectasis, bronchitis, environmental allergies or pneumonia.      Review of Systems  Constitutional: Negative for fever, chills, weight loss, diaphoresis, activity change, appetite change, fatigue and unexpected weight change.  HENT: Positive for congestion, sore throat, rhinorrhea, sneezing and sinus pressure. Negative for hearing loss, ear pain, nosebleeds, facial swelling, drooling, mouth sores, trouble swallowing, neck pain, neck stiffness, dental problem, voice change, postnasal drip, tinnitus and ear discharge.   Eyes: Negative for photophobia, pain, discharge, redness, itching and visual disturbance.  Respiratory: Positive for cough (productive of yellow phlegm). Negative for hemoptysis,  shortness of breath and wheezing.   Cardiovascular: Negative for chest pain, palpitations and leg swelling.  Gastrointestinal: Negative for heartburn, nausea, vomiting, abdominal pain, constipation, blood in stool and anal bleeding.  Genitourinary: Negative for dysuria, urgency, frequency, hematuria, flank pain, decreased urine volume, enuresis and difficulty urinating.  Musculoskeletal: Negative for myalgias, back pain, joint pain, joint swelling, arthralgias and gait problem.  Skin: Negative for color change, pallor, rash and wound.  Neurological: Negative for dizziness, tremors, seizures, syncope, facial asymmetry, speech difficulty, weakness, light-headedness, numbness and headaches.  Hematological: Negative for environmental allergies and adenopathy. Does not bruise/bleed easily.  Psychiatric/Behavioral: Negative.        Objective:   Physical Exam  Vitals reviewed. Constitutional: He is oriented to person, place, and time. He appears well-developed and well-nourished. No distress.  HENT:  Head: Normocephalic and atraumatic.  Right Ear: External ear normal.  Left Ear: External ear normal.  Nose: Nose normal.  Mouth/Throat: Oropharynx is clear and moist. No oropharyngeal exudate.  Eyes: Conjunctivae and EOM are normal. Pupils are equal, round, and reactive to light. Right eye exhibits no discharge. Left eye exhibits no discharge. No scleral icterus.  Neck: Normal range of motion. Neck supple. No JVD present. No tracheal deviation present. No thyromegaly present.  Cardiovascular: Normal rate, regular rhythm, normal heart sounds and intact distal pulses.  Exam reveals no gallop and no friction rub.   No murmur heard. Pulmonary/Chest: Breath sounds normal. No stridor. No respiratory distress. He has no wheezes. He has no rales. He exhibits no tenderness.  Abdominal: Soft. Bowel sounds are normal. He exhibits no distension and no mass. There is no tenderness. There is no rebound and no  guarding.  Musculoskeletal: Normal range of motion. He exhibits no edema and no tenderness.  Lymphadenopathy:    He has no  cervical adenopathy.  Neurological: He is alert and oriented to person, place, and time. He has normal reflexes. He displays normal reflexes. No cranial nerve deficit. He exhibits normal muscle tone. Coordination normal.  Skin: Skin is warm and dry. No rash noted. He is not diaphoretic. No erythema. No pallor.  Psychiatric: He has a normal mood and affect. His behavior is normal. Judgment and thought content normal.          Assessment & Plan:

## 2010-11-21 NOTE — Assessment & Plan Note (Signed)
Try zutripro for symptom relief and ceftin for the infection

## 2010-11-21 NOTE — Assessment & Plan Note (Signed)
I will check a cxr to look for pna, mass, edema, effusions, etc

## 2010-11-21 NOTE — Assessment & Plan Note (Signed)
Start spiriva and I asked him to stop smoking

## 2010-12-04 ENCOUNTER — Telehealth: Payer: Self-pay | Admitting: Cardiovascular Disease

## 2010-12-04 NOTE — Telephone Encounter (Signed)
Called because she missed the call earlier about his medications and wanted to speak to someone about them. Please call back.

## 2010-12-05 ENCOUNTER — Encounter: Payer: Self-pay | Admitting: Internal Medicine

## 2010-12-05 ENCOUNTER — Other Ambulatory Visit: Payer: Self-pay | Admitting: *Deleted

## 2010-12-05 ENCOUNTER — Other Ambulatory Visit (INDEPENDENT_AMBULATORY_CARE_PROVIDER_SITE_OTHER): Payer: BC Managed Care – PPO

## 2010-12-05 ENCOUNTER — Ambulatory Visit (INDEPENDENT_AMBULATORY_CARE_PROVIDER_SITE_OTHER): Payer: BC Managed Care – PPO | Admitting: Internal Medicine

## 2010-12-05 VITALS — BP 110/70 | HR 68 | Temp 98.3°F | Resp 16 | Ht 71.0 in | Wt 194.0 lb

## 2010-12-05 DIAGNOSIS — I10 Essential (primary) hypertension: Secondary | ICD-10-CM

## 2010-12-05 DIAGNOSIS — R05 Cough: Secondary | ICD-10-CM

## 2010-12-05 DIAGNOSIS — Z0000 Encounter for general adult medical examination without abnormal findings: Secondary | ICD-10-CM

## 2010-12-05 DIAGNOSIS — J449 Chronic obstructive pulmonary disease, unspecified: Secondary | ICD-10-CM

## 2010-12-05 DIAGNOSIS — E871 Hypo-osmolality and hyponatremia: Secondary | ICD-10-CM

## 2010-12-05 DIAGNOSIS — R059 Cough, unspecified: Secondary | ICD-10-CM

## 2010-12-05 DIAGNOSIS — E785 Hyperlipidemia, unspecified: Secondary | ICD-10-CM

## 2010-12-05 DIAGNOSIS — K921 Melena: Secondary | ICD-10-CM

## 2010-12-05 LAB — URINALYSIS, ROUTINE W REFLEX MICROSCOPIC
Bilirubin Urine: NEGATIVE
Hgb urine dipstick: NEGATIVE
Ketones, ur: NEGATIVE
Leukocytes, UA: NEGATIVE
Nitrite: NEGATIVE
Specific Gravity, Urine: 1.015 (ref 1.000–1.030)
Total Protein, Urine: NEGATIVE
Urine Glucose: NEGATIVE
Urobilinogen, UA: 0.2 (ref 0.0–1.0)
pH: 6 (ref 5.0–8.0)

## 2010-12-05 LAB — CBC WITH DIFFERENTIAL/PLATELET
Basophils Absolute: 0 10*3/uL (ref 0.0–0.1)
Basophils Relative: 0.3 % (ref 0.0–3.0)
Eosinophils Absolute: 0.3 10*3/uL (ref 0.0–0.7)
Eosinophils Relative: 3.6 % (ref 0.0–5.0)
HCT: 46 % (ref 39.0–52.0)
Hemoglobin: 15.4 g/dL (ref 13.0–17.0)
Lymphocytes Relative: 42.8 % (ref 12.0–46.0)
Lymphs Abs: 3.6 10*3/uL (ref 0.7–4.0)
MCHC: 33.5 g/dL (ref 30.0–36.0)
MCV: 94.4 fl (ref 78.0–100.0)
Monocytes Absolute: 0.7 10*3/uL (ref 0.1–1.0)
Monocytes Relative: 7.9 % (ref 3.0–12.0)
Neutro Abs: 3.9 10*3/uL (ref 1.4–7.7)
Neutrophils Relative %: 45.4 % (ref 43.0–77.0)
Platelets: 278 10*3/uL (ref 150.0–400.0)
RBC: 4.87 Mil/uL (ref 4.22–5.81)
RDW: 14.4 % (ref 11.5–14.6)
WBC: 8.5 10*3/uL (ref 4.5–10.5)

## 2010-12-05 LAB — COMPREHENSIVE METABOLIC PANEL
ALT: 28 U/L (ref 0–53)
AST: 22 U/L (ref 0–37)
Albumin: 4.1 g/dL (ref 3.5–5.2)
Alkaline Phosphatase: 62 U/L (ref 39–117)
BUN: 21 mg/dL (ref 6–23)
CO2: 29 mEq/L (ref 19–32)
Calcium: 9.7 mg/dL (ref 8.4–10.5)
Chloride: 100 mEq/L (ref 96–112)
Creatinine, Ser: 1.2 mg/dL (ref 0.4–1.5)
GFR: 63.4 mL/min (ref >60.00)
Glucose, Bld: 100 mg/dL — ABNORMAL HIGH (ref 70–99)
Potassium: 5.4 mEq/L — ABNORMAL HIGH (ref 3.5–5.1)
Sodium: 136 mEq/L (ref 135–145)
Total Bilirubin: 0.8 mg/dL (ref 0.3–1.2)
Total Protein: 7.1 g/dL (ref 6.0–8.3)

## 2010-12-05 LAB — LIPID PANEL
Cholesterol: 165 mg/dL (ref 0–200)
HDL: 41.7 mg/dL (ref >39.00)
LDL Cholesterol: 99 mg/dL (ref 0–99)
Total CHOL/HDL Ratio: 4
Triglycerides: 120 mg/dL (ref 0.0–149.0)
VLDL: 24 mg/dL (ref 0.0–40.0)

## 2010-12-05 LAB — PSA: PSA: 0.4 ng/mL (ref 0.10–4.00)

## 2010-12-05 LAB — TSH: TSH: 5.53 u[IU]/mL — ABNORMAL HIGH (ref 0.35–5.50)

## 2010-12-05 MED ORDER — OLMESARTAN MEDOXOMIL 20 MG PO TABS: 20.0000 mg | ORAL_TABLET | Freq: Every day | ORAL | Status: DC

## 2010-12-05 MED ORDER — LISINOPRIL 10 MG PO TABS: 10.0000 mg | ORAL_TABLET | Freq: Every day | ORAL | Status: DC

## 2010-12-05 MED ORDER — CHOLINE FENOFIBRATE 135 MG PO CPDR: 135.0000 mg | DELAYED_RELEASE_CAPSULE | Freq: Every day | ORAL | Status: DC

## 2010-12-05 NOTE — Assessment & Plan Note (Signed)
Continue lipitor, trilipix, and niaspan, today I will check his FLP and CMP

## 2010-12-05 NOTE — Assessment & Plan Note (Signed)
Exam done, labs ordered, pt ed material was given 

## 2010-12-05 NOTE — Assessment & Plan Note (Signed)
GI referral

## 2010-12-05 NOTE — Assessment & Plan Note (Signed)
His cough is better but is still present, I have asked him to stop the ACEI and to stop smoking and he will continue with spiriva

## 2010-12-05 NOTE — Assessment & Plan Note (Signed)
He sounds improved on spiriva

## 2010-12-05 NOTE — Assessment & Plan Note (Signed)
Stop the ACEI due to cough and change to Benicar

## 2010-12-05 NOTE — Progress Notes (Signed)
Subjective:    Patient ID: Daniel Carey, male    DOB: 25-Aug-1950, 60 y.o.   MRN: [id number]  HPI He returns for a complete physical and tells me that he continues to cough but it is improving, he occasionally has some blood tinge in the mucous but otherwise it is clear.   Review of Systems  Constitutional: Negative for fever, chills, diaphoresis, activity change, appetite change, fatigue and unexpected weight change.  HENT: Positive for congestion and rhinorrhea. Negative for hearing loss, ear pain, nosebleeds, sore throat, facial swelling, sneezing, drooling, mouth sores, trouble swallowing, neck pain, neck stiffness, dental problem, voice change, postnasal drip, sinus pressure, tinnitus and ear discharge.   Eyes: Negative for photophobia, pain, discharge, redness, itching and visual disturbance.  Respiratory: Negative for apnea, cough, choking, chest tightness, shortness of breath, wheezing and stridor.   Cardiovascular: Negative for chest pain, palpitations and leg swelling.  Gastrointestinal: Negative for nausea, vomiting, abdominal pain, diarrhea, constipation, blood in stool and abdominal distention.  Genitourinary: Negative for dysuria, urgency, frequency, hematuria, flank pain, decreased urine volume, discharge, penile swelling, scrotal swelling, enuresis, difficulty urinating, penile pain and testicular pain.  Musculoskeletal: Negative for myalgias, back pain, joint swelling, arthralgias and gait problem.  Skin: Negative for color change, pallor, rash and wound.  Neurological: Negative for dizziness, tremors, seizures, syncope, facial asymmetry, speech difficulty, weakness, light-headedness, numbness and headaches.  Hematological: Negative for adenopathy. Does not bruise/bleed easily.  Psychiatric/Behavioral: Negative for suicidal ideas, hallucinations, behavioral problems, confusion, sleep disturbance, self-injury, dysphoric mood, decreased concentration and agitation. The patient is  not nervous/anxious and is not hyperactive.        Objective:   Physical Exam  Vitals reviewed. Constitutional: He is oriented to person, place, and time. He appears well-developed and well-nourished. No distress.  HENT:  Head: Normocephalic and atraumatic.  Right Ear: External ear normal.  Left Ear: External ear normal.  Nose: Nose normal.  Mouth/Throat: Oropharynx is clear and moist. No oropharyngeal exudate.  Eyes: Conjunctivae and EOM are normal. Pupils are equal, round, and reactive to light. Right eye exhibits no discharge. Left eye exhibits no discharge. No scleral icterus.  Neck: Normal range of motion. Neck supple. No JVD present. No tracheal deviation present. No thyromegaly present.  Cardiovascular: Normal rate, regular rhythm, normal heart sounds and intact distal pulses.  Exam reveals no gallop and no friction rub.   No murmur heard. Pulmonary/Chest: Effort normal and breath sounds normal. No stridor. No respiratory distress. He has no wheezes. He has no rales. He exhibits no tenderness.  Abdominal: Soft. Bowel sounds are normal. He exhibits no distension. There is no tenderness. There is no rebound and no guarding. Hernia confirmed negative in the right inguinal area and confirmed negative in the left inguinal area.  Genitourinary: Prostate normal, testes normal and penis normal. Rectal exam shows external hemorrhoid and internal hemorrhoid. Rectal exam shows no fissure, no mass, no tenderness and anal tone normal. Guaiac positive stool. Prostate is not enlarged and not tender. Right testis shows no mass, no swelling and no tenderness. Right testis is descended. Cremasteric reflex is not absent on the right side. Left testis shows no mass, no swelling and no tenderness. Left testis is descended. Cremasteric reflex is not absent on the left side. Circumcised. No penile erythema or penile tenderness. No discharge found.  Musculoskeletal: Normal range of motion. He exhibits no edema  and no tenderness.  Lymphadenopathy:    He has no cervical adenopathy.  Right: No inguinal adenopathy present.       Left: No inguinal adenopathy present.  Neurological: He is alert and oriented to person, place, and time. He has normal reflexes. He displays normal reflexes. No cranial nerve deficit. He exhibits normal muscle tone. Coordination normal.  Skin: Skin is warm and dry. No rash noted. He is not diaphoretic. No erythema. No pallor.  Psychiatric: He has a normal mood and affect. His behavior is normal. Judgment and thought content normal.      Lab Results  Component Value Date   WBC 9.7 02/13/2010   HGB 15.4 02/13/2010   HCT 44.4 02/13/2010   PLT 285.0 02/13/2010   CHOL 184 08/12/2010   TRIG 200.0* 08/12/2010   HDL 41.10 08/12/2010   ALT 22 08/12/2010   AST 18 08/12/2010   NA 137 08/12/2010   K 4.6 08/12/2010   CL 103 08/12/2010   CREATININE 1.1 08/12/2010   BUN 22 08/12/2010   CO2 28 08/12/2010   TSH 3.07 02/13/2010   PSA 0.47 02/13/2010      Assessment & Plan:

## 2010-12-05 NOTE — Telephone Encounter (Signed)
meds verified and ordered

## 2010-12-05 NOTE — Patient Instructions (Signed)
Hypertension (High Blood Pressure) As your heart beats, it forces blood through your arteries. This force is your blood pressure. If the pressure is too high, it is called hypertension (HTN) or high blood pressure. HTN is dangerous because you may have it and not know it. High blood pressure may mean that your heart has to work harder to pump blood. Your arteries may be narrow or stiff. The extra work puts you at risk for heart disease, stroke, and other problems.  Blood pressure consists of two numbers, a higher number over a lower, 110/72, for example. It is stated as "110 over 72." The ideal is below 120 for the top number (systolic) and under 80 for the bottom (diastolic). Write down your blood pressure today. You should pay close attention to your blood pressure if you have certain conditions such as:  Heart failure.  Prior heart attack.   Diabetes   Chronic kidney disease.   Prior stroke.   Multiple risk factors for heart disease.   To see if you have HTN, your blood pressure should be measured while you are seated with your arm held at the level of the heart. It should be measured at least twice. A one-time elevated blood pressure reading (especially in the Emergency Department) does not mean that you need treatment. There may be conditions in which the blood pressure is different between your right and left arms. It is important to see your caregiver soon for a recheck. Most people have essential hypertension which means that there is not a specific cause. This type of high blood pressure may be lowered by changing lifestyle factors such as:  Stress.  Smoking.   Lack of exercise.   Excessive weight.  Drug/tobacco/alcohol use.   Eating less salt.   Most people do not have symptoms from high blood pressure until it has caused damage to the body. Effective treatment can often prevent, delay or reduce that damage. TREATMENT Treatment for high blood pressure, when a cause has been  identified, is directed at the cause. There are a large number of medications to treat HTN. These fall into several categories, and your caregiver will help you select the medicines that are best for you. Medications may have side effects. You should review side effects with your caregiver. If your blood pressure stays high after you have made lifestyle changes or started on medicines,   Your medication(s) may need to be changed.   Other problems may need to be addressed.   Be certain you understand your prescriptions, and know how and when to take your medicine.   Be sure to follow up with your caregiver within the time frame advised (usually within two weeks) to have your blood pressure rechecked and to review your medications.   If you are taking more than one medicine to lower your blood pressure, make sure you know how and at what times they should be taken. Taking two medicines at the same time can result in blood pressure that is too low.  SEEK IMMEDIATE MEDICAL CARE IF YOU DEVELOP:  A severe headache, blurred or changing vision, or confusion.   Unusual weakness or numbness, or a faint feeling.   Severe chest or abdominal pain, vomiting, or breathing problems.  MAKE SURE YOU:   Understand these instructions.   Will watch your condition.   Will get help right away if you are not doing well or get worse.  Document Released: 04/06/2005 Document Re-Released: 09/24/2009 ExitCare Patient Information 2011 ExitCare,   LL[employer].Health Maintenance in Males MAINTAIN REGULAR HEALTH EXAMS  Maintain a healthy diet and normal weight. Increased weight leads to problems with blood pressure and diabetes. Decrease fat in the diet and increase exercise. Obtain a proper diet from your caregiver if necessary.   High blood pressure causes heart and blood vessel problems. Check blood pressures regularly and keep your blood pressure at normal limits. Aerobic exercise helps this. Persistent elevations of  blood pressure should be treated with medications if weight loss and exercise are ineffective.   Avoid smoking, drinking in excess (more than 2 drinks per day), or use of street drugs. Do not share needles with anyone. Ask for help if you need assistance or instructions on stopping the use of alcohol, cigarettes, or drugs.   Maintain normal blood lipids and cholesterol. Your caregiver can give you information to lower your risk of heart disease or stroke.   Ask your caregiver if you are in need of early heart disease screening because of a strong family history of heart disease or signs of elevated testosterone (male sex hormone) levels. These can predispose you to early heart disease.   Practice safe sex. Practicing safe sex decreases your risk for a sexually transmitted infection (STI). Some of the STIs are gonorrhea, chlamydia, syphilis, trichimonas, herpes, human papillomavirus (HPV), and human immunodeficiency virus (HIV). Herpes, HIV, and HPV are viral illnesses that have no cure. These can result in disability, cancer, and death.   It is not safe for someone who has AIDS or is HIV positive to have unprotected sex with a partner who is HIV positive. The reason for this is the fact that there are many different strains of HIV. If you have a strain that is readily treated with medications and then suddenly introduce a strain from a partner that has no further treatment options, you may suddenly have a strain of HIV that is untreatable. Even if you are both positive for HIV, it is still necessary to practice safe sex.   Use sunscreen with a SPF of 15 or greater. Being outside in the sun when your shadow caused by the sun is shorter than you are, means you are being exposed to sun at greater intensity. Lighter skinned people are at a greater risk of skin cancer.   Keep carbon monoxide and smoke detectors in your home and functioning at all times. Change the batteries every 6 months.   Do monthly  examinations of your testicles. The best time to do this is after a hot shower or bath when the tissues are loose. Notify your caregivers of any lumps, tenderness, or changes in size or shape.   Notify your caregiver of new moles or changes in moles, especially if there is a change in shape or color. Also notify your caregiver if a mole is larger than the size of a pencil eraser.   Stay current with your tetanus shots and other required immunizations.  The Body Mass Index (BMI) is a way of measuring how much of your body is fat. Having a BMI above 27 increases the risk of heart disease, diabetes, hypertension, stroke, and other problems related to obesity. Document Released: 10/03/2007 Document Re-Released: 09/24/2009 ExitCare Patient Information 2011 [employer]. 

## 2010-12-05 NOTE — Telephone Encounter (Signed)
Fax received from pharmacy. Refill completed. Jodette Felicitas Sine RN  

## 2010-12-18 ENCOUNTER — Ambulatory Visit: Payer: BC Managed Care – PPO | Admitting: Gastroenterology

## 2010-12-19 ENCOUNTER — Encounter: Payer: Self-pay | Admitting: *Deleted

## 2010-12-23 ENCOUNTER — Encounter: Payer: Self-pay | Admitting: Gastroenterology

## 2010-12-23 ENCOUNTER — Ambulatory Visit (INDEPENDENT_AMBULATORY_CARE_PROVIDER_SITE_OTHER): Payer: BC Managed Care – PPO | Admitting: Gastroenterology

## 2010-12-23 ENCOUNTER — Telehealth: Payer: Self-pay | Admitting: Gastroenterology

## 2010-12-23 DIAGNOSIS — Z860100 Personal history of colon polyps, unspecified: Secondary | ICD-10-CM | POA: Insufficient documentation

## 2010-12-23 DIAGNOSIS — K625 Hemorrhage of anus and rectum: Secondary | ICD-10-CM

## 2010-12-23 DIAGNOSIS — R195 Other fecal abnormalities: Secondary | ICD-10-CM

## 2010-12-23 DIAGNOSIS — K219 Gastro-esophageal reflux disease without esophagitis: Secondary | ICD-10-CM

## 2010-12-23 MED ORDER — RABEPRAZOLE SODIUM 20 MG PO TBEC: 20.0000 mg | DELAYED_RELEASE_TABLET | Freq: Every day | ORAL | Status: DC

## 2010-12-23 NOTE — Telephone Encounter (Signed)
Spoke with pt and [place] Mole and insurance will not cover colon until 03/14/2011 so pt wants to wait and have both procedures after that date. i will send myself a reminder to call pt and schedule ECL with [facility]

## 2010-12-23 NOTE — Progress Notes (Signed)
This is a 60 year old Caucasian male who travels extensively around the [place]. He recently had pneumonitis and was treated with antibiotics, and apparently had some hemoptysis but a negative chest x-ray. He does have chronic GERD and is on AcipHex 20 mg a day. Last endoscopic exam was 10 years ago. He also has a long history of recurrent colon polyps with last colonoscopy one year ago showing a large flat hepatic flexure polyp that was excised in piecemeal fashion. Another polyp also was removed that was adenomatous. The patient denies lower GI or hepatobiliary complaints at this time. His appetite is good and his weight is stable.  Current Medications, Allergies, Past Medical History, Past Surgical History, Family History and Social History were reviewed in [employer] record.  Pertinent Review of Systems Negative.. no current cardiovascular or pulmonary complaints. He does have asthmatic bronchitis and uses when necessary inhalers. Patient also has a history of multiple drug allergies. 10 point review of systems otherwise negative.   Physical Exam: Cannot appreciate stigmata of chronic liver disease or lymphadenopathy. Chest is generally clear to percussion and auscultation. He appears to be in a regular rhythm without murmurs gallops or rubs. There is no organomegaly, abdominal masses or tenderness. Bowel sounds are normal. Peripheral extremities are unremarkable. Mental status is normal.    Assessment and Plan: Recent guaiac positive stools possibly related to residual colonic polyposis versus related to chronic GERD and perhaps Barrett's mucosa and his esophagus. I have scheduled both endoscopy and colonoscopy with propofol sedation, revieweda reflex regime, and we'll continue AcipHex 20 mg a day. He is to continue other medications as listed and reviewed in his record. Encounter Diagnoses  Name Primary?  . Esophageal reflux   . Rectal bleeding   . Personal history of colonic  polyps   . Guaiac positive stools

## 2010-12-23 NOTE — Patient Instructions (Signed)
Your procedure has been scheduled for 01/28/2011, please follow the seperate instructions.  Your prescription(s) have been sent to you pharmacy.   

## 2011-01-05 ENCOUNTER — Other Ambulatory Visit: Payer: Self-pay | Admitting: Dermatology

## 2011-01-28 ENCOUNTER — Encounter: Payer: BC Managed Care – PPO | Admitting: Gastroenterology

## 2011-03-04 ENCOUNTER — Encounter: Payer: Self-pay | Admitting: Cardiovascular Disease

## 2011-03-04 ENCOUNTER — Ambulatory Visit (INDEPENDENT_AMBULATORY_CARE_PROVIDER_SITE_OTHER): Payer: BC Managed Care – PPO | Admitting: Cardiovascular Disease

## 2011-03-04 VITALS — BP 140/90 | HR 69 | Ht 70.0 in | Wt 194.8 lb

## 2011-03-04 DIAGNOSIS — E785 Hyperlipidemia, unspecified: Secondary | ICD-10-CM

## 2011-03-04 DIAGNOSIS — I2510 Atherosclerotic heart disease of native coronary artery without angina pectoris: Secondary | ICD-10-CM

## 2011-03-04 NOTE — Patient Instructions (Signed)
Your physician wants you to follow-up in: 6 Months.  You will receive a reminder letter in the mail two months in advance. If you don't receive a letter, please call our office to schedule the follow-up appointment.  Your physician recommends that you return for a FASTING lipid profile: Next Week and at 6 Month Office Visit  Your physician has recommended you make the following change in your medication:   1)  STOP LISINOPRIL  2)  Stop Smoking  3)  Exercise, work up to 20-30 minutes daily.

## 2011-03-04 NOTE — Progress Notes (Signed)
Daniel Carey Date of Birth  Oct 09, 1950 [facility] [address]    [address] [place], [place]  [address] [phone]  Fax  [phone]  History of Present Illness:  Daniel Carey is a 60 year old Carey with a history of coronary artery disease. He has intermittent episodes of chest pain. He also has a history of hypertension, hyperlipidemia, gastroesophageal reflux disease and cigarette smoking.  He's done fairly well since I last saw him 6 months ago. He still has an occasional episodes of chest pain. They're not so severe that he hasn't taken nitroglycerin.  He typically has these episodes of chest pain when he is resting. His wife thinks it may be associated to eating. He exercises periodically and has never had episodes of chest pain with exercise.  Current Outpatient Prescriptions on File Prior to Visit  Medication Sig Dispense Refill  . aspirin 81 MG tablet Take 81 mg by mouth daily.        Marland Kitchen atorvastatin (LIPITOR) 80 MG tablet Take 80 mg by mouth daily.        . hydrochlorothiazide 25 MG tablet Take 25 mg by mouth daily.        . metoprolol (LOPRESSOR) 100 MG tablet Take 50 mg by mouth 2 (two) times daily.        . nitroGLYCERIN (NITROSTAT) 0.4 MG SL tablet Place 0.4 mg under the tongue every 5 (five) minutes as needed.        . RABEprazole (ACIPHEX) 20 MG tablet Take 1 tablet (20 mg total) by mouth daily.  30 tablet  6    Allergies  Allergen Reactions  . Lisinopril Cough  . Amlodipine Besylate   . Lipitor (Atorvastatin Calcium)   . Rosuvastatin     REACTION: muscle aches  . Trilipix     Past Medical History  Diagnosis Date  . CAD (coronary artery disease)   . Chest pain   . HTN (hypertension)   . HLD (hyperlipidemia)   . GERD (gastroesophageal reflux disease)   . Hypertriglyceridemia   . COPD (chronic obstructive pulmonary disease)   . Hiatal hernia   . External hemorrhoids without mention of complication   . Headache   . Nephrolithiasis   . Myocardial  infarction   . ED (erectile dysfunction)   . Personal history of colonic polyps 03/12/2010    TUBULAR ADENOMA  . Esophageal stricture   . Hemorrhoids     Past Surgical History  Procedure Date  . Angioplasty     X3    History  Smoking status  . Current Everyday Smoker -- 1.0 packs/day for 40 years  . Types: Cigarettes  Smokeless tobacco  . Never Used    History  Alcohol Use  . 7.2 oz/week  . 12 Shots of liquor per week    rare    Family History  Problem Relation Age of Onset  . Heart disease Father   . Diabetes Mother     and brother   . Colon cancer Neg Hx     Reviw of Systems:  Reviewed in the HPI.  All other systems are negative.  Physical Exam: BP 140/90  Pulse 69  Ht 5\' 10"  (1.778 m)  Wt 194 lb 12.8 oz (88.361 kg)  BMI 27.95 kg/m2 The patient is alert and oriented x 3.  The mood and affect are normal.   Skin: warm and dry.  Color is normal.    HEENT:   No JVD, normal carotids,   Lungs: few wheezes,  Heart: RR, no murmurs    Abdomen: + BS, non tender  Extremities:  No c/c/e  Neuro:  nonfocal     Assessment / Plan:

## 2011-03-04 NOTE — Assessment & Plan Note (Signed)
Daniel Carey seems to be doing fairly well. He does have some occasional episodes of very atypical chest pain. These typically occur when he is resting at night. He does not have episodes of chest pain when he's exercising.  I asked him to exercise more. Avastin the cut back on his eating. He also needs to stop smoking.

## 2011-03-05 ENCOUNTER — Other Ambulatory Visit: Payer: Self-pay | Admitting: Internal Medicine

## 2011-03-10 ENCOUNTER — Other Ambulatory Visit: Payer: BC Managed Care – PPO | Admitting: *Deleted

## 2011-03-13 ENCOUNTER — Other Ambulatory Visit (INDEPENDENT_AMBULATORY_CARE_PROVIDER_SITE_OTHER): Payer: BC Managed Care – PPO | Admitting: *Deleted

## 2011-03-13 DIAGNOSIS — E785 Hyperlipidemia, unspecified: Secondary | ICD-10-CM

## 2011-03-13 LAB — HEPATIC FUNCTION PANEL
ALT: 30 U/L (ref 0–53)
AST: 19 U/L (ref 0–37)
Albumin: 3.9 g/dL (ref 3.5–5.2)
Alkaline Phosphatase: 57 U/L (ref 39–117)
Bilirubin, Direct: 0 mg/dL (ref 0.0–0.3)
Total Bilirubin: 0.8 mg/dL (ref 0.3–1.2)
Total Protein: 7.2 g/dL (ref 6.0–8.3)

## 2011-03-13 LAB — BASIC METABOLIC PANEL
BUN: 18 mg/dL (ref 6–23)
CO2: 28 mEq/L (ref 19–32)
Calcium: 9.4 mg/dL (ref 8.4–10.5)
Chloride: 106 mEq/L (ref 96–112)
Creatinine, Ser: 1 mg/dL (ref 0.4–1.5)
GFR: 77.6 mL/min (ref >60.00)
Glucose, Bld: 127 mg/dL — ABNORMAL HIGH (ref 70–99)
Potassium: 4.1 mEq/L (ref 3.5–5.1)
Sodium: 141 mEq/L (ref 135–145)

## 2011-03-13 LAB — LIPID PANEL
Cholesterol: 165 mg/dL (ref 0–200)
HDL: 41.1 mg/dL (ref >39.00)
LDL Cholesterol: 91 mg/dL (ref 0–99)
Total CHOL/HDL Ratio: 4
Triglycerides: 163 mg/dL — ABNORMAL HIGH (ref 0.0–149.0)
VLDL: 32.6 mg/dL (ref 0.0–40.0)

## 2011-03-17 ENCOUNTER — Telehealth: Payer: Self-pay | Admitting: Gastroenterology

## 2011-03-17 NOTE — Telephone Encounter (Signed)
Message copied by Bernita Buffy on Tue Mar 17, 2011  1:49 PM ------      Message from: [place], [place]      Created: Mon Mar 09, 2011 10:51 AM                   ----- Message -----         From: Harlow Mares, CMA         Sent: 03/02/2011           To: Harlow Mares, CMA            Needs ECL with MAC after 03/14/2011

## 2011-03-17 NOTE — Telephone Encounter (Signed)
When Daniel Carey was out she sent a staff message to herself to call patient after 03/14/11 to schedule ECL with MAC and since I worked with Dr. Jarold Motto that week I called the patient to schedule patient. Patient stated that he would wait until Jan to schedule procedure when he know more about his schedule. He would call back sooner if he could schedule earlier.

## 2011-04-02 ENCOUNTER — Other Ambulatory Visit: Payer: Self-pay | Admitting: Cardiovascular Disease

## 2011-05-01 ENCOUNTER — Other Ambulatory Visit: Payer: Self-pay | Admitting: *Deleted

## 2011-05-01 MED ORDER — METOPROLOL TARTRATE 100 MG PO TABS: 50.0000 mg | ORAL_TABLET | Freq: Two times a day (BID) | ORAL | Status: DC

## 2011-05-01 NOTE — Telephone Encounter (Signed)
Fax Received. Refill Completed. Asianae Minkler Chowoe (R.M.A)   

## 2011-06-22 ENCOUNTER — Encounter: Payer: Self-pay | Admitting: Gastroenterology

## 2011-06-26 ENCOUNTER — Ambulatory Visit (AMBULATORY_SURGERY_CENTER): Payer: BC Managed Care – PPO | Admitting: *Deleted

## 2011-06-26 VITALS — Ht 70.0 in | Wt 196.4 lb

## 2011-06-26 DIAGNOSIS — K219 Gastro-esophageal reflux disease without esophagitis: Secondary | ICD-10-CM

## 2011-06-26 DIAGNOSIS — K625 Hemorrhage of anus and rectum: Secondary | ICD-10-CM

## 2011-06-26 MED ORDER — PEG-KCL-NACL-NASULF-NA ASC-C 100 G PO SOLR: ORAL | Status: DC

## 2011-07-03 ENCOUNTER — Other Ambulatory Visit: Payer: Self-pay | Admitting: Cardiovascular Disease

## 2011-07-08 ENCOUNTER — Ambulatory Visit (AMBULATORY_SURGERY_CENTER): Payer: BC Managed Care – PPO | Admitting: Gastroenterology

## 2011-07-08 ENCOUNTER — Encounter: Payer: Self-pay | Admitting: Gastroenterology

## 2011-07-08 VITALS — BP 172/98 | HR 65 | Temp 98.1°F | Resp 15 | Ht 70.0 in | Wt 196.0 lb

## 2011-07-08 DIAGNOSIS — K5730 Diverticulosis of large intestine without perforation or abscess without bleeding: Secondary | ICD-10-CM

## 2011-07-08 DIAGNOSIS — K219 Gastro-esophageal reflux disease without esophagitis: Secondary | ICD-10-CM

## 2011-07-08 DIAGNOSIS — K2970 Gastritis, unspecified, without bleeding: Secondary | ICD-10-CM

## 2011-07-08 DIAGNOSIS — K2990 Gastroduodenitis, unspecified, without bleeding: Secondary | ICD-10-CM

## 2011-07-08 DIAGNOSIS — D126 Benign neoplasm of colon, unspecified: Secondary | ICD-10-CM

## 2011-07-08 DIAGNOSIS — K625 Hemorrhage of anus and rectum: Secondary | ICD-10-CM

## 2011-07-08 DIAGNOSIS — K921 Melena: Secondary | ICD-10-CM

## 2011-07-08 DIAGNOSIS — R195 Other fecal abnormalities: Secondary | ICD-10-CM

## 2011-07-08 MED ORDER — SODIUM CHLORIDE 0.9 % IV SOLN: 500.0000 mL | INTRAVENOUS | Status: DC

## 2011-07-08 NOTE — Patient Instructions (Signed)
YOU HAD AN ENDOSCOPIC PROCEDURE TODAY AT THE [facility]: Refer to the procedure report that was given to you for any specific questions about what was found during the examination.  If the procedure report does not answer your questions, please call your gastroenterologist to clarify.  If you requested that your care partner not be given the details of your procedure findings, then the procedure report has been included in a sealed envelope for you to review at your convenience later.  YOU SHOULD EXPECT: Some feelings of bloating in the abdomen. Passage of more gas than usual.  Walking can help get rid of the air that was put into your GI tract during the procedure and reduce the bloating. If you had a lower endoscopy (such as a colonoscopy or flexible sigmoidoscopy) you may notice spotting of blood in your stool or on the toilet paper. If you underwent a bowel prep for your procedure, then you may not have a normal bowel movement for a few days.  DIET: Your first meal following the procedure should be a light meal and then it is ok to progress to your normal diet.  A half-sandwich or bowl of soup is an example of a good first meal.  Heavy or fried foods are harder to digest and may make you feel nauseous or bloated.  Likewise meals heavy in dairy and vegetables can cause extra gas to form and this can also increase the bloating.  Drink plenty of fluids but you should avoid alcoholic beverages for 24 hours.  ACTIVITY: Your care partner should take you home directly after the procedure.  You should plan to take it easy, moving slowly for the rest of the day.  You can resume normal activity the day after the procedure however you should NOT DRIVE or use heavy machinery for 24 hours (because of the sedation medicines used during the test).    SYMPTOMS TO REPORT IMMEDIATELY: A gastroenterologist can be reached at any hour.  During normal business hours, 8:30 AM to 5:00 PM Monday through Friday,  call [phone].  After hours and on weekends, please call the GI answering service at [phone] who will take a message and have the physician on call contact you.   Following lower endoscopy (colonoscopy or flexible sigmoidoscopy):  Excessive amounts of blood in the stool  Significant tenderness or worsening of abdominal pains  Swelling of the abdomen that is new, acute  Fever of 100F or higher  Following upper endoscopy (EGD)  Vomiting of blood or coffee ground material  New chest pain or pain under the shoulder blades  Painful or persistently difficult swallowing  New shortness of breath  Fever of 100F or higher  Black, tarry-looking stools  FOLLOW UP: If any biopsies were taken you will be contacted by phone or by letter within the next 1-3 weeks.  Call your gastroenterologist if you have not heard about the biopsies in 3 weeks.  Our staff will call the home number listed on your records the next business day following your procedure to check on you and address any questions or concerns that you may have at that time regarding the information given to you following your procedure. This is a courtesy call and so if there is no answer at the home number and we have not heard from you through the emergency physician on call, we will assume that you have returned to your regular daily activities without incident.  SIGNATURES/CONFIDENTIALITY: You and/or your care   partner have signed paperwork which will be entered into your electronic medical record.  These signatures attest to the fact that that the information above on your After Visit Summary has been reviewed and is understood.  Full responsibility of the confidentiality of this discharge information lies with you and/or your care-partner.  

## 2011-07-08 NOTE — Op Note (Signed)
[facility] [address] [place], [place]  [address]  COLONOSCOPY PROCEDURE REPORT  PATIENT:  Daniel Carey, Daniel Carey  MR#:  [id number] BIRTHDATE:  03-07-1951, 60 yrs. old  GENDER:  male ENDOSCOPIST:  Vania Rea. Jarold Motto, MD, [facility] REF. BY: PROCEDURE DATE:  07/08/2011 PROCEDURE:  Colonoscopy with snare polypectomy ASA CLASS:  Class II INDICATIONS:  FOBT positive stool, history of polyps MEDICATIONS:   propofol (Diprivan) 150 mg IV  DESCRIPTION OF PROCEDURE:   After the risks and benefits and of the procedure were explained, informed consent was obtained. Digital rectal exam was performed and revealed no abnormalities. The LB CF-H180AL [id number] endoscope was introduced through the anus and advanced to the cecum, which was identified by both the appendix and ileocecal valve.  The quality of the prep was excellent, using MoviPrep.  The instrument was then slowly withdrawn as the colon was fully examined. <<PROCEDUREIMAGES>>  FINDINGS:  There were mild diverticular changes in left colon. diverticulosis was found.  There were multiple polyps identified and removed. in the distal transverse colon. 2 SMALL FLAT TV COLON POLYPS COLD SNARE REMOVED  This was otherwise a normal examination of the colon.   Retroflexed views in the rectum revealed no abnormalities.    The scope was then withdrawn from the patient and the procedure completed.  COMPLICATIONS:  None ENDOSCOPIC IMPRESSION: 1) Diverticulosis,mild,left sided diverticulosis 2) Polyps, multiple in the distal transverse colon 3) Otherwise normal examination R/O ADENOMAS RECOMMENDATIONS: 1) Repeat colonoscopy in 5 years if polyp adenomatous; otherwise 10 years 2) High fiber diet.  REPEAT EXAM:  No  ______________________________ Vania Rea. Jarold Motto, MD, [facility]  CC:  Lindley Magnus, MD  n. Rosalie DoctorVania Rea. Patterson at 07/08/2011 03:22 PM  Joaquin Music, [phone]

## 2011-07-08 NOTE — Progress Notes (Signed)
Post colonoscopy pt had episodes of heart rate dropping into 30's with dropped QRS's. Strip run and posted, Dr. Jarold Motto aware.

## 2011-07-08 NOTE — Progress Notes (Signed)
Patient did not experience any of the following events: a burn prior to discharge; a fall within the facility; wrong site/side/patient/procedure/implant event; or a hospital transfer or hospital admission upon discharge from the facility. (G8907) Patient did not have preoperative order for IV antibiotic SSI prophylaxis. (G8918)  

## 2011-07-08 NOTE — Op Note (Signed)
[facility] [address] [place], [place]  [address]  ENDOSCOPY PROCEDURE REPORT  PATIENT:  Daniel Carey, Daniel Carey  MR#:  [id number] BIRTHDATE:  05-Jul-1950, 60 yrs. old  GENDER:  male  ENDOSCOPIST:  Vania Rea. Jarold Motto, MD, [facility] Referred by:  PROCEDURE DATE:  07/08/2011 PROCEDURE:  EGD with biopsy for H. pylori 30865 ASA CLASS:  Class II INDICATIONS:  hemoccult positive stool  MEDICATIONS:   There was residual sedation effect present from prior procedure., propofol (Diprivan) 150 mg TOPICAL ANESTHETIC:  DESCRIPTION OF PROCEDURE:   After the risks and benefits of the procedure were explained, informed consent was obtained.  The LB GIF-H180 [id number] endoscope was introduced through the mouth and advanced to the second portion of the duodenum.  The instrument was slowly withdrawn as the mucosa was fully examined. <<PROCEDUREIMAGES>>  Moderate gastritis was found in the body and the antrum of the stomach. LINEAR ERYTHEMA.CLO BX. DONE.  Otherwise the examination was normal.    Retroflexed views revealed no abnormalities.    The scope was then withdrawn from the patient and the procedure completed.  COMPLICATIONS:  None  ENDOSCOPIC IMPRESSION: 1) Moderate gastritis in the body and the antrum of the stomach  2) Otherwise normal examination R/O H.PYLORI GASTRITIS. RECOMMENDATIONS: 1) Await biopsy results 2) Rx CLO if positive 3) continue current medications  ______________________________ Vania Rea. Jarold Motto, MD, [facility]  CC:  Lindley Magnus, MD  n. Rosalie DoctorVania Rea. Sahmir Weatherbee at 07/08/2011 03:33 PM  Joaquin Music, [phone]

## 2011-07-09 ENCOUNTER — Telehealth: Payer: Self-pay | Admitting: *Deleted

## 2011-07-09 ENCOUNTER — Telehealth: Payer: Self-pay | Admitting: Cardiovascular Disease

## 2011-07-09 NOTE — Telephone Encounter (Signed)
Please return call to patient at hm#[phone]    Or Cell# [phone]  Patient c/o low heart rate, PCP suggest possible blockage.  Please work patient into schedule, next avail. appnt middle of April.

## 2011-07-09 NOTE — Telephone Encounter (Signed)
  Follow up Call-  Call back number 07/08/2011  Post procedure Call Back phone  # [phone]  Permission to leave phone message Yes     Patient questions:  Do you have a fever, pain , or abdominal swelling? no Pain Score  0 *  Have you tolerated food without any problems? yes  Have you been able to return to your normal activities? yes  Do you have any questions about your discharge instructions: Diet   no Medications  no Follow up visit  no  Do you have questions or concerns about your Care? no  Actions: * If pain score is 4 or above: No action needed, pain <4.

## 2011-07-09 NOTE — Telephone Encounter (Signed)
Made appt for pt to see Norma Fredrickson on 07/14/11.

## 2011-07-13 ENCOUNTER — Encounter: Payer: Self-pay | Admitting: Gastroenterology

## 2011-07-13 LAB — HELICOBACTER PYLORI SCREEN-BIOPSY: UREASE: NEGATIVE

## 2011-07-13 LAB — HM COLONOSCOPY

## 2011-07-14 ENCOUNTER — Ambulatory Visit (INDEPENDENT_AMBULATORY_CARE_PROVIDER_SITE_OTHER): Payer: BC Managed Care – PPO | Admitting: Nurse Practitioner

## 2011-07-14 ENCOUNTER — Encounter: Payer: Self-pay | Admitting: Nurse Practitioner

## 2011-07-14 ENCOUNTER — Ambulatory Visit (INDEPENDENT_AMBULATORY_CARE_PROVIDER_SITE_OTHER)
Admission: RE | Admit: 2011-07-14 | Discharge: 2011-07-14 | Disposition: A | Discharge status: Home / Self Care | Payer: BC Managed Care – PPO | Source: Ambulatory Visit | Attending: Nurse Practitioner | Admitting: Nurse Practitioner

## 2011-07-14 ENCOUNTER — Encounter: Payer: Self-pay | Admitting: Gastroenterology

## 2011-07-14 ENCOUNTER — Other Ambulatory Visit: Payer: Self-pay | Admitting: *Deleted

## 2011-07-14 VITALS — BP 140/80 | HR 42 | Ht 70.0 in | Wt 197.0 lb

## 2011-07-14 DIAGNOSIS — R042 Hemoptysis: Secondary | ICD-10-CM

## 2011-07-14 DIAGNOSIS — R001 Bradycardia, unspecified: Secondary | ICD-10-CM

## 2011-07-14 DIAGNOSIS — I441 Atrioventricular block, second degree: Secondary | ICD-10-CM | POA: Insufficient documentation

## 2011-07-14 DIAGNOSIS — R059 Cough, unspecified: Secondary | ICD-10-CM

## 2011-07-14 DIAGNOSIS — I498 Other specified cardiac arrhythmias: Secondary | ICD-10-CM

## 2011-07-14 DIAGNOSIS — I2510 Atherosclerotic heart disease of native coronary artery without angina pectoris: Secondary | ICD-10-CM

## 2011-07-14 DIAGNOSIS — R05 Cough: Secondary | ICD-10-CM

## 2011-07-14 LAB — CBC WITH DIFFERENTIAL/PLATELET
Basophils Absolute: 0 10*3/uL (ref 0.0–0.1)
Basophils Relative: 0.5 % (ref 0.0–3.0)
Eosinophils Absolute: 0.4 10*3/uL (ref 0.0–0.7)
Eosinophils Relative: 4.3 % (ref 0.0–5.0)
HCT: 46.7 % (ref 39.0–52.0)
Hemoglobin: 15.6 g/dL (ref 13.0–17.0)
Lymphocytes Relative: 33.7 % (ref 12.0–46.0)
Lymphs Abs: 3 10*3/uL (ref 0.7–4.0)
MCHC: 33.5 g/dL (ref 30.0–36.0)
MCV: 93.8 fl (ref 78.0–100.0)
Monocytes Absolute: 0.7 10*3/uL (ref 0.1–1.0)
Monocytes Relative: 7.6 % (ref 3.0–12.0)
Neutro Abs: 4.8 10*3/uL (ref 1.4–7.7)
Neutrophils Relative %: 53.9 % (ref 43.0–77.0)
Platelets: 287 10*3/uL (ref 150.0–400.0)
RBC: 4.98 Mil/uL (ref 4.22–5.81)
RDW: 14.2 % (ref 11.5–14.6)
WBC: 8.9 10*3/uL (ref 4.5–10.5)

## 2011-07-14 LAB — BASIC METABOLIC PANEL
BUN: 20 mg/dL (ref 6–23)
CO2: 26 mEq/L (ref 19–32)
Calcium: 9.6 mg/dL (ref 8.4–10.5)
Chloride: 101 mEq/L (ref 96–112)
Creatinine, Ser: 1.2 mg/dL (ref 0.4–1.5)
GFR: 69.02 mL/min (ref >60.00)
Glucose, Bld: 117 mg/dL — ABNORMAL HIGH (ref 70–99)
Potassium: 4.4 mEq/L (ref 3.5–5.1)
Sodium: 137 mEq/L (ref 135–145)

## 2011-07-14 LAB — TSH: TSH: 5.61 u[IU]/mL — ABNORMAL HIGH (ref 0.35–5.50)

## 2011-07-14 MED ORDER — MOXIFLOXACIN HCL 400 MG PO TABS: ORAL_TABLET | ORAL | Status: DC

## 2011-07-14 MED ORDER — IOHEXOL 300 MG/ML  SOLN
80.0000 mL | Freq: Once | INTRAMUSCULAR | Status: AC | PRN
  Administered 2011-07-14: 80 mL via INTRAVENOUS

## 2011-07-14 MED ORDER — METOPROLOL TARTRATE 100 MG PO TABS: ORAL_TABLET | ORAL | Status: DC

## 2011-07-14 NOTE — Patient Instructions (Signed)
Cut your Lopressor back to just 1/4 tablet two times a day  We are going to check some labs today  We are going to arrange for a CT scan on your chest to evaluate the cough and coughing up blood  I will see you with Dr. Elease Hashimoto in 2 weeks.  Call the [facility] office at [phone] if you have any questions, problems or concerns.

## 2011-07-14 NOTE — Assessment & Plan Note (Signed)
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 I have discussed his case with Dr. Elease Hashimoto. We will cut his Lopressor back to just 25 mg BID. We will see him back in 2 weeks with a repeat EKG.

## 2011-07-14 NOTE — Assessment & Plan Note (Signed)
Patient reports deep cough along with persistent hemoptysis. Has ongoing tobacco abuse. No recent CXR. Will arrange for CT of the chest to further define. Patient is agreeable to this plan and will call if any problems develop in the interim.

## 2011-07-14 NOTE — Progress Notes (Signed)
Daniel Carey Date of Birth: 05/11/1950 Medical Record #[id number]  History of Present Illness: Daniel Carey is seen today for a work in visit. He is seen for Dr. Elease Hashimoto. He is a 61 year old male with known CAD, HTN, HLD and ongoing tobacco abuse. Still smoking about a 1/2 pack per day.   He comes in today. He is here alone. He has had his EGD and colonoscopy last week with Dr. Jarold Motto. Noted to have a "low heart rate". Follow up visit was arranged. He has been a little dizzy especially when he coughs. No frank syncope. Does report hemoptysis with a deep cough over the last week or so. No fever or chills. Taking OTC cold meds without any relief. Still smoking. Has chest pain on occasion that seems chronic for him.   Current Outpatient Prescriptions on File Prior to Visit  Medication Sig Dispense Refill  . aspirin 81 MG tablet Take 81 mg by mouth daily.        . hydrochlorothiazide (HYDRODIURIL) 25 MG tablet TAKE 1 TABLET DAILY.  30 tablet  6  . LIPITOR 80 MG tablet TAKE 1 TABLET DAILY  90 each  3  . NITROSTAT 0.4 MG SL tablet TAKE 1 TABLET UNDER TONGUE EVERY 5 MINUTES UP TO 3 TIMES AS NEEDED FOR CHEST PAIN  25 each  11  . RABEprazole (ACIPHEX) 20 MG tablet Take 1 tablet (20 mg total) by mouth daily.  30 tablet  6  . tiotropium (SPIRIVA) 18 MCG inhalation capsule Place 18 mcg into inhaler and inhale as needed.        . TRILIPIX 135 MG capsule TAKE (1) CAPSULE DAILY  30 each  6  . vitamin E (VITAMIN E) 400 UNIT capsule Take 800 Units by mouth daily. Takes 2 capsules daily        Allergies  Allergen Reactions  . Lisinopril Cough  . Amlodipine Besylate Other (See Comments)    Headache, weakness, dizziness  . Lipitor (Atorvastatin Calcium) Other (See Comments)    Muscle aches  . Rosuvastatin Other (See Comments)    REACTION: muscle aches  . Trilipix Other (See Comments)    Per pt: unknown    Past Medical History  Diagnosis Date  . CAD (coronary artery disease)     moderate to  severe distal CAD with large epicardial arteries having only mild to moderate irregularities. Last cath in Dec 2006   . Chest pain   . HTN (hypertension)   . HLD (hyperlipidemia)   . GERD (gastroesophageal reflux disease)   . COPD (chronic obstructive pulmonary disease)   . Hiatal hernia   . External hemorrhoids without mention of complication   . Headache   . Nephrolithiasis   . ED (erectile dysfunction)   . Personal history of colonic polyps 03/12/2010    TUBULAR ADENOMA  . Esophageal stricture   . Tobacco abuse   . AV block, Mobitz 1     noted on EKG March 2013    Past Surgical History  Procedure Date  . Angioplasty     X3    History  Smoking status  . Current Everyday Smoker -- 0.5 packs/day for 40 years  . Types: Cigarettes  Smokeless tobacco  . Never Used    History  Alcohol Use  . 0.0 oz/week    rare    Family History  Problem Relation Age of Onset  . Heart disease Father   . Diabetes Mother     and brother   .  Colon cancer Neg Hx   . Stomach cancer Neg Hx     Review of Systems: The review of systems is positive for hemoptysis.  All other systems were reviewed and are negative.  Physical Exam: BP 140/80  Pulse 42  Ht 5\' 10"  (1.778 m)  Wt 197 lb (89.359 kg)  BMI 28.27 kg/m2 Patient is very pleasant and in no acute distress. Skin is warm and dry. Color is normal.  HEENT is unremarkable. Normocephalic/atraumatic. PERRL. Sclera are nonicteric. Neck is supple. No masses. No JVD. Lungs are clear. Cardiac exam shows a regular rhythm. Rate is a little slow. Abdomen is soft. Extremities are without edema. Gait and ROM are intact. No gross neurologic deficits noted.  LABORATORY DATA: EKG today shows sinus with 2nd degree AV block, type I. Rate is 42. Tracing is reviewed with Dr. Elease Hashimoto.    Assessment / Plan:

## 2011-07-16 ENCOUNTER — Telehealth: Payer: Self-pay | Admitting: Gastroenterology

## 2011-07-16 NOTE — Telephone Encounter (Signed)
Informed wife of the path report and that letters were mailed today. Wife reports pt did see his cardiologist and his lopressor is being tapered off to see if that's the problem otherwise he may need a pacemaker.

## 2011-07-20 ENCOUNTER — Other Ambulatory Visit (INDEPENDENT_AMBULATORY_CARE_PROVIDER_SITE_OTHER): Payer: BC Managed Care – PPO

## 2011-07-20 ENCOUNTER — Encounter: Payer: Self-pay | Admitting: Internal Medicine

## 2011-07-20 ENCOUNTER — Ambulatory Visit (INDEPENDENT_AMBULATORY_CARE_PROVIDER_SITE_OTHER): Payer: BC Managed Care – PPO | Admitting: Internal Medicine

## 2011-07-20 VITALS — BP 118/80 | HR 73 | Temp 97.3°F | Resp 16 | Wt 199.0 lb

## 2011-07-20 DIAGNOSIS — E785 Hyperlipidemia, unspecified: Secondary | ICD-10-CM

## 2011-07-20 DIAGNOSIS — F17200 Nicotine dependence, unspecified, uncomplicated: Secondary | ICD-10-CM

## 2011-07-20 DIAGNOSIS — E039 Hypothyroidism, unspecified: Secondary | ICD-10-CM

## 2011-07-20 DIAGNOSIS — R7309 Other abnormal glucose: Secondary | ICD-10-CM

## 2011-07-20 DIAGNOSIS — I2510 Atherosclerotic heart disease of native coronary artery without angina pectoris: Secondary | ICD-10-CM

## 2011-07-20 DIAGNOSIS — J449 Chronic obstructive pulmonary disease, unspecified: Secondary | ICD-10-CM

## 2011-07-20 DIAGNOSIS — I10 Essential (primary) hypertension: Secondary | ICD-10-CM

## 2011-07-20 DIAGNOSIS — J4489 Other specified chronic obstructive pulmonary disease: Secondary | ICD-10-CM

## 2011-07-20 DIAGNOSIS — Z23 Encounter for immunization: Secondary | ICD-10-CM

## 2011-07-20 LAB — COMPREHENSIVE METABOLIC PANEL
ALT: 22 U/L (ref 0–53)
AST: 18 U/L (ref 0–37)
Albumin: 4.1 g/dL (ref 3.5–5.2)
Alkaline Phosphatase: 56 U/L (ref 39–117)
BUN: 21 mg/dL (ref 6–23)
CO2: 30 mEq/L (ref 19–32)
Calcium: 9.6 mg/dL (ref 8.4–10.5)
Chloride: 103 mEq/L (ref 96–112)
Creatinine, Ser: 1.1 mg/dL (ref 0.4–1.5)
GFR: 71.15 mL/min (ref >60.00)
Glucose, Bld: 124 mg/dL — ABNORMAL HIGH (ref 70–99)
Potassium: 4.8 mEq/L (ref 3.5–5.1)
Sodium: 140 mEq/L (ref 135–145)
Total Bilirubin: 0.5 mg/dL (ref 0.3–1.2)
Total Protein: 7.3 g/dL (ref 6.0–8.3)

## 2011-07-20 LAB — TSH: TSH: 6.83 u[IU]/mL — ABNORMAL HIGH (ref 0.35–5.50)

## 2011-07-20 LAB — LIPID PANEL
Cholesterol: 142 mg/dL (ref 0–200)
HDL: 43.6 mg/dL (ref >39.00)
LDL Cholesterol: 69 mg/dL (ref 0–99)
Total CHOL/HDL Ratio: 3
Triglycerides: 145 mg/dL (ref 0.0–149.0)
VLDL: 29 mg/dL (ref 0.0–40.0)

## 2011-07-20 LAB — HEMOGLOBIN A1C: Hgb A1c MFr Bld: 6.7 % — ABNORMAL HIGH (ref 4.6–6.5)

## 2011-07-20 LAB — T4: T4, Total: 7.5 ug/dL (ref 5.0–12.5)

## 2011-07-20 LAB — T3, FREE: T3, Free: 2.8 pg/mL (ref 2.3–4.2)

## 2011-07-20 NOTE — Assessment & Plan Note (Signed)
He is doing well on lipitor, I will check his FLP today 

## 2011-07-20 NOTE — Patient Instructions (Signed)

## 2011-07-20 NOTE — Progress Notes (Signed)
Subjective:    Patient ID: Daniel Carey, male    DOB: Nov 17, 1950, 61 y.o.   MRN: [id number]  Thyroid Problem Presents for initial visit. Symptoms include palpitations (he saw cardiology for eval of low heart rate, metorpolol dose has been reduced). Patient reports no anxiety, cold intolerance, constipation, depressed mood, diaphoresis, diarrhea, dry skin, fatigue, hair loss, heat intolerance, hoarse voice, leg swelling, nail problem, tremors, visual change, weight gain or weight loss. The symptoms have been stable. Past treatments include nothing. The following procedures have not been performed: radioiodine uptake scan, thyroid FNA, thyroid ultrasound and thyroidectomy. His past medical history is significant for hyperlipidemia. There is no history of diabetes.  Hyperlipidemia This is a chronic problem. The current episode started more than 1 year ago. The problem is controlled. Recent lipid tests were reviewed and are variable. Exacerbating diseases include hypothyroidism. He has no history of chronic renal disease, diabetes, liver disease, obesity or nephrotic syndrome. Factors aggravating his hyperlipidemia include beta blockers and thiazides. Pertinent negatives include no chest pain, focal sensory loss, focal weakness, leg pain, myalgias or shortness of breath. Current antihyperlipidemic treatment includes statins. The current treatment provides moderate improvement of lipids. Compliance problems include adherence to exercise and adherence to diet.       Review of Systems  Constitutional: Negative for fever, chills, weight loss, weight gain, diaphoresis, activity change, appetite change, fatigue and unexpected weight change.  HENT: Negative.  Negative for hoarse voice.   Eyes: Negative.   Respiratory: Negative for cough, chest tightness, shortness of breath, wheezing and stridor.   Cardiovascular: Positive for palpitations (he saw cardiology for eval of low heart rate, metorpolol dose has  been reduced). Negative for chest pain and leg swelling.  Gastrointestinal: Negative for nausea, vomiting, abdominal pain, diarrhea, constipation, blood in stool, abdominal distention and anal bleeding.  Genitourinary: Negative.   Musculoskeletal: Negative for myalgias, back pain, joint swelling, arthralgias and gait problem.  Neurological: Negative.  Negative for dizziness, tremors, focal weakness, seizures, syncope, speech difficulty, weakness and light-headedness.  Hematological: Negative for cold intolerance, heat intolerance and adenopathy. Does not bruise/bleed easily.  Psychiatric/Behavioral: Negative.        Objective:   Physical Exam  Vitals reviewed. Constitutional: He is oriented to person, place, and time. He appears well-developed and well-nourished. No distress.  HENT:  Head: Normocephalic and atraumatic.  Mouth/Throat: Oropharynx is clear and moist. No oropharyngeal exudate.  Eyes: Conjunctivae are normal. Right eye exhibits no discharge. Left eye exhibits no discharge. No scleral icterus.  Neck: Normal range of motion. Neck supple. No JVD present. No tracheal deviation present. No thyromegaly present.  Cardiovascular: Normal rate, regular rhythm, normal heart sounds and intact distal pulses.  Exam reveals no gallop and no friction rub.   No murmur heard. Pulmonary/Chest: Effort normal and breath sounds normal. No stridor. No respiratory distress. He has no wheezes. He has no rales. He exhibits no tenderness.  Abdominal: Soft. Bowel sounds are normal. He exhibits no distension and no mass. There is no tenderness. There is no rebound and no guarding.  Musculoskeletal: Normal range of motion. He exhibits no edema and no tenderness.  Lymphadenopathy:    He has no cervical adenopathy.  Neurological: He is oriented to person, place, and time.  Skin: Skin is warm and dry. No rash noted. He is not diaphoretic. No erythema. No pallor.  Psychiatric: He has a normal mood and affect.  His behavior is normal. Judgment and thought content normal.  Lab Results  Component Value Date   WBC 8.9 07/14/2011   HGB 15.6 07/14/2011   HCT 46.7 07/14/2011   PLT 287.0 07/14/2011   GLUCOSE 117* 07/14/2011   CHOL 165 03/13/2011   TRIG 163.0* 03/13/2011   HDL 41.10 03/13/2011   LDLCALC 91 03/13/2011   ALT 30 03/13/2011   AST 19 03/13/2011   NA 137 07/14/2011   K 4.4 07/14/2011   CL 101 07/14/2011   CREATININE 1.2 07/14/2011   BUN 20 07/14/2011   CO2 26 07/14/2011   TSH 5.61* 07/14/2011   PSA 0.40 12/05/2010      Assessment & Plan:

## 2011-07-20 NOTE — Assessment & Plan Note (Signed)
Recent tasting showed a slightly elevated TSH level which would explain his low HR and palpitations, today he tells me that he does not want to take a T supplement, I will recheck his TSH T3 T4 today

## 2011-07-20 NOTE — Assessment & Plan Note (Signed)
His BP is well controlled 

## 2011-07-20 NOTE — Assessment & Plan Note (Signed)
I will check his a1c today 

## 2011-07-20 NOTE — Assessment & Plan Note (Signed)
He has reduced his tobacco intake significantly and will try to stop completely soon

## 2011-07-28 ENCOUNTER — Ambulatory Visit (INDEPENDENT_AMBULATORY_CARE_PROVIDER_SITE_OTHER): Payer: BC Managed Care – PPO | Admitting: Internal Medicine

## 2011-07-28 ENCOUNTER — Encounter: Payer: Self-pay | Admitting: Nurse Practitioner

## 2011-07-28 ENCOUNTER — Ambulatory Visit (INDEPENDENT_AMBULATORY_CARE_PROVIDER_SITE_OTHER): Payer: BC Managed Care – PPO | Admitting: Nurse Practitioner

## 2011-07-28 ENCOUNTER — Encounter: Payer: Self-pay | Admitting: Internal Medicine

## 2011-07-28 VITALS — BP 132/88 | HR 73 | Temp 97.6°F | Ht 71.0 in | Wt 202.6 lb

## 2011-07-28 VITALS — BP 148/90 | HR 72 | Ht 70.0 in | Wt 199.2 lb

## 2011-07-28 DIAGNOSIS — F17200 Nicotine dependence, unspecified, uncomplicated: Secondary | ICD-10-CM

## 2011-07-28 DIAGNOSIS — J449 Chronic obstructive pulmonary disease, unspecified: Secondary | ICD-10-CM

## 2011-07-28 DIAGNOSIS — I459 Conduction disorder, unspecified: Secondary | ICD-10-CM

## 2011-07-28 DIAGNOSIS — R042 Hemoptysis: Secondary | ICD-10-CM

## 2011-07-28 DIAGNOSIS — J4489 Other specified chronic obstructive pulmonary disease: Secondary | ICD-10-CM

## 2011-07-28 DIAGNOSIS — R05 Cough: Secondary | ICD-10-CM

## 2011-07-28 DIAGNOSIS — R059 Cough, unspecified: Secondary | ICD-10-CM

## 2011-07-28 MED ORDER — TRAMADOL HCL 50 MG PO TABS: ORAL_TABLET | ORAL | Status: AC

## 2011-07-28 MED ORDER — METOPROLOL TARTRATE 50 MG PO TABS: 25.0000 mg | ORAL_TABLET | Freq: Two times a day (BID) | ORAL | Status: DC

## 2011-07-28 MED ORDER — FAMOTIDINE 20 MG PO TABS: ORAL_TABLET | ORAL | Status: DC

## 2011-07-28 MED ORDER — PREDNISONE (PAK) 10 MG PO TABS: ORAL_TABLET | ORAL | Status: AC

## 2011-07-28 MED ORDER — AMOXICILLIN-POT CLAVULANATE 875-125 MG PO TABS: 1.0000 | ORAL_TABLET | Freq: Two times a day (BID) | ORAL | Status: AC

## 2011-07-28 NOTE — Patient Instructions (Signed)
Augmentin 875 twice daily bfast and supper, yogurt for lunch - take for 10 days then call for sinus ct scan if not better  Take mucinex dm 1200mg   every 12 hours and supplement if needed with  tramadol 50 mg up to 1-2   every 4 hours to suppress the urge to cough. Swallowing water or using ice chips/non mint and menthol containing candies (such as lifesavers or sugarless jolly ranchers) are also effective.  You should rest your voice and avoid activities that you know make you cough.  Once you have eliminated the cough for 3 straight days try reducing the tramadol first,  then the delsym as tolerated.    Try acidphex 20mg   Take 30-60 min before first meal of the day and Pepcid 20 mg one bedtime until cough is completely gone for at least a week without the need for cough suppression  Prednisone 10 mg take  4 each am x 2 days,   2 each am x 2 days,  1 each am x2days and stop   Call me to schedule bronchoscopy after you return Tues - Thursday to be 100% sure about the source of the blood but I suspect most of it's coming from your sinuses.

## 2011-07-28 NOTE — Assessment & Plan Note (Addendum)
Cessation is encouraged. He does not appear ready to quit at this time.

## 2011-07-28 NOTE — Patient Instructions (Addendum)
Smoking cessation is encouraged.   We will make a referral to see the lung doctor.  I have sent a prescription for the Metoprolol 50 mg to take just a half two times a day  Monitor your blood pressure at home and keep a diary. Minimize your salt  Stay active  I want you to see Dr. Elease Hashimoto in 2 months with a repeat EKG.

## 2011-07-28 NOTE — Assessment & Plan Note (Signed)
Patient no longer has 2nd degree AV block Type I today. He is feeling better with the reduced dose of his metoprolol. I have left him on his current regimen and we will see him back in 2 months with a repeat EKG. Patient is agreeable to this plan and will call if any problems develop in the interim.

## 2011-07-28 NOTE — Assessment & Plan Note (Signed)
He has a moderate degree of emphysema from his recent CT. Mild hemoptysis has recurred. Will refer back to pulmonary. His wife is quite adamant that this is related to his drugs for his cholesterol. I have explained that I feel this is all smoking related. Cessation is encouraged.

## 2011-07-28 NOTE — Progress Notes (Signed)
  Subjective:    Patient ID: Daniel Carey, male    DOB: May 06, 1950 MRN: [id number]  HPI  82 yowm smoker frequent flyer including international with pattern of increased frequency and severity typically in winter x 2008 and since 2010 noted some streaky blood quantity more pronounced especially since abrupt onset mid March 15th 2013 referred 07/28/2011 to pulmonary clinic.   07/28/2011 1st pulmonary ov cc persistent hemoptysis x 3 weeks better transiently  p avelox and nasal drainage esp lie down occ with epistaxis and chest discomfort from coughing.  Mucus is mostly white, blood is less a tbsp per day now. No sob though not aerobically active  Sleeping ok without nocturnal  or early am exacerbation  of respiratory  c/o's or need for noct saba. Also denies any obvious fluctuation of symptoms with weather or environmental changes or other aggravating or alleviating factors except as outlined above      Review of Systems  Constitutional: Negative for fever, chills, activity change, appetite change and unexpected weight change.  HENT: Positive for congestion and sneezing. Negative for sore throat, rhinorrhea, trouble swallowing, dental problem, voice change and postnasal drip.   Eyes: Negative for visual disturbance.  Respiratory: Positive for cough. Negative for choking and shortness of breath.   Cardiovascular: Positive for chest pain. Negative for leg swelling.  Gastrointestinal: Negative for nausea, vomiting and abdominal pain.  Genitourinary: Negative for difficulty urinating.  Musculoskeletal: Negative for arthralgias.  Skin: Negative for rash.  Psychiatric/Behavioral: Negative for behavioral problems and confusion.       Objective:   Physical Exam Stoic wm nad Wt 202 07/28/2011  HEENT mild turbinate edema.  Oropharynx no thrush or excess pnd or cobblestoning.  No JVD or cervical adenopathy. Mild accessory muscle hypertrophy. Trachea midline, nl thryroid. Chest was hyperinflated by  percussion with diminished breath sounds and moderate increased exp time without wheeze. Hoover sign positive at mid inspiration. Regular rate and rhythm without murmur gallop or rub or increase P2 or edema.  Abd: no hsm, nl excursion. Ext warm without cyanosis or clubbing.    Ct chest 07/14/11 Scattered bilateral pulmonary nodules, measuring up to 4-5 mm in  the right upper lobe, unchanged from 2010, benign.  Underlying moderate emphysematous changes. No superimposed  opacities suspicious for pneumonia.  Bilateral adrenal adenomas. Splenic granulomata.      Assessment & Plan:

## 2011-07-28 NOTE — Progress Notes (Signed)
Daniel Carey Date of Birth: [birth year] Medical Record #[id number]  History of Present Illness: Daniel Carey is seen back today for a 2 week check. He is seen for Dr. Elease Hashimoto. He has known CAD, HTN, HLD and ongoing tobacco abuse.  He was here last week for evaluation of a low heart rate noted during his colonoscopy. He was also having complaints of hemoptysis. He had had some dizziness with coughing as well. He was noted to be in 2nd degree AV block type I and his lopressor was cut back. We did proceed on with CT scan in light of the hemoptysis. No masses noted. He was then treated with antibiotics.  He comes back today. He is here with his wife. He is feeling better. His dizziness has resolved. His hemoptysis did improve with the antibiotics but now he is having a little blood mixed in with his sputum. Blood pressure at home has improved. His wife is quite adamant that it is his Lipitor and Trilipix that has given him COPD. He is still smoking. Not ready to quit.   Current Outpatient Prescriptions on File Prior to Visit  Medication Sig Dispense Refill  . aspirin 81 MG tablet Take 81 mg by mouth daily.        . hydrochlorothiazide (HYDRODIURIL) 25 MG tablet TAKE 1 TABLET DAILY.  30 tablet  6  . LIPITOR 80 MG tablet TAKE 1 TABLET DAILY  90 each  3  . NITROSTAT 0.4 MG SL tablet TAKE 1 TABLET UNDER TONGUE EVERY 5 MINUTES UP TO 3 TIMES AS NEEDED FOR CHEST PAIN  25 each  11  . RABEprazole (ACIPHEX) 20 MG tablet Take 1 tablet (20 mg total) by mouth daily.  30 tablet  6  . tiotropium (SPIRIVA) 18 MCG inhalation capsule Place 18 mcg into inhaler and inhale as needed.        . TRILIPIX 135 MG capsule TAKE (1) CAPSULE DAILY  30 each  6  . vitamin E (VITAMIN E) 400 UNIT capsule Take 800 Units by mouth daily. Takes 2 capsules daily      . DISCONTD: tiotropium (SPIRIVA HANDIHALER) 18 MCG inhalation capsule Place 1 capsule (18 mcg total) into inhaler and inhale daily.  30 capsule  2    Allergies  Allergen  Reactions  . Lisinopril Cough  . Amlodipine Besylate Other (See Comments)    Headache, weakness, dizziness  . Lipitor (Atorvastatin Calcium) Other (See Comments)    Muscle aches  . Rosuvastatin Other (See Comments)    REACTION: muscle aches  . Trilipix Other (See Comments)    Per pt: unknown    Past Medical History  Diagnosis Date  . CAD (coronary artery disease)     moderate to severe distal CAD with large epicardial arteries having only mild to moderate irregularities. Last cath in Dec 2006   . Chest pain   . HTN (hypertension)   . HLD (hyperlipidemia)   . GERD (gastroesophageal reflux disease)   . COPD (chronic obstructive pulmonary disease)   . Hiatal hernia   . External hemorrhoids without mention of complication   . Headache   . Nephrolithiasis   . ED (erectile dysfunction)   . Personal history of colonic polyps 03/12/2010    TUBULAR ADENOMA  . Esophageal stricture   . Tobacco abuse   . AV block, Mobitz 1     noted on EKG March 2013    Past Surgical History  Procedure Date  . Angioplasty  X3    History  Smoking status  . Current Everyday Smoker -- 0.5 packs/day for 40 years  . Types: Cigarettes  Smokeless tobacco  . Never Used    History  Alcohol Use  . 0.0 oz/week    rare    Family History  Problem Relation Age of Onset  . Heart disease Father   . Diabetes Mother     and brother   . Colon cancer Neg Hx   . Stomach cancer Neg Hx     Review of Systems: The review of systems is per the HPI.  All other systems were reviewed and are negative.  Physical Exam: BP 148/90  Pulse 72  Ht 5\' 10"  (1.778 m)  Wt 199 lb 3.2 oz (90.357 kg)  BMI 28.58 kg/m2 Patient is pleasant and in no acute distress. Skin is warm and dry. Color is normal.  HEENT is unremarkable. Normocephalic/atraumatic. PERRL. Sclera are nonicteric. Neck is supple. No masses. No JVD. Lungs are coarse. Cardiac exam shows a regular rate and rhythm. Abdomen is soft. Extremities are  without edema. Gait and ROM are intact. No gross neurologic deficits noted.   LABORATORY DATA: Repeat EKG today shows sinus rhythm with a first degree AV block today.   Ct Chest W Contrast  07/14/2011  *RADIOLOGY REPORT*  Clinical Data: Bradycardia, intermittent hemoptysis  CT CHEST WITH CONTRAST  Technique:  Multidetector CT imaging of the chest was performed following the standard protocol during bolus administration of intravenous contrast.  Contrast:  80 ml Omnipaque-300 IV  Comparison: [facility] chest radiographs dated 11/20/2010, [facility] Imaging CT chest dated 02/27/2009  Findings: Scattered nodules in the bilateral lungs, including two 4- 5 mm nodules in the right upper lobe (series 3/images 19-20), unchanged from 2010, benign.  Moderate underlying centrilobular emphysema.  No superimposed opacities suspicious for pneumonia.  No pleural effusion or pneumothorax.  Visualized thyroid is unremarkable.  The heart is normal in size.  No pericardial effusion.  Coronary atherosclerosis atherosclerotic calcifications of the aortic arch.  Small mediastinal/right hilar lymph nodes which do not meet pathologic CT size criteria. No suspicious axillary lymphadenopathy.  Visualized upper abdomen is notable for bilateral adrenal nodules, previously characterized as adrenal adenomas, and splenic granulomata.  Degenerative changes of the visualized thoracolumbar spine.  IMPRESSION: Scattered bilateral pulmonary nodules, measuring up to 4-5 mm in the right upper lobe, unchanged from 2010, benign.  Underlying moderate emphysematous changes.  No superimposed opacities suspicious for pneumonia.  Bilateral adrenal adenomas.  Splenic granulomata.  Original Report Authenticated By: Charline Bills, M.D.    Lab Results  Component Value Date   WBC 8.9 07/14/2011   HGB 15.6 07/14/2011   HCT 46.7 07/14/2011   PLT 287.0 07/14/2011   GLUCOSE 124* 07/20/2011   CHOL 142 07/20/2011   TRIG 145.0 07/20/2011   HDL 43.60 07/20/2011    LDLCALC 69 07/20/2011   ALT 22 07/20/2011   AST 18 07/20/2011   NA 140 07/20/2011   K 4.8 07/20/2011   CL 103 07/20/2011   CREATININE 1.1 07/20/2011   BUN 21 07/20/2011   CO2 30 07/20/2011   TSH 6.83* 07/20/2011   PSA 0.40 12/05/2010   HGBA1C 6.7* 07/20/2011    Assessment / Plan:

## 2011-07-29 DIAGNOSIS — R042 Hemoptysis: Secondary | ICD-10-CM | POA: Insufficient documentation

## 2011-07-29 DIAGNOSIS — R05 Cough: Secondary | ICD-10-CM | POA: Insufficient documentation

## 2011-07-29 DIAGNOSIS — R058 Other specified cough: Secondary | ICD-10-CM | POA: Insufficient documentation

## 2011-07-29 NOTE — Assessment & Plan Note (Signed)
Most likely from sinus dz but can't be 100% sure about the source s fob, especially given chronicity which is of concern in active smoker  Discussed in detail all the  indications, usual  risks and alternatives  relative to the benefits with patient who agrees to proceed with bronchoscopy when his travel schedule will permit

## 2011-07-29 NOTE — Assessment & Plan Note (Signed)
Pt not amenable to discussion " it's not just the cigarettes that cause lung problems"

## 2011-07-29 NOTE — Assessment & Plan Note (Signed)
The most common causes of chronic cough in immunocompetent adults include the following: upper airway cough syndrome (UACS), previously referred to as postnasal drip syndrome (PNDS), which is caused by variety of rhinosinus conditions; (2) asthma; (3) GERD; (4) chronic bronchitis from cigarette smoking or other inhaled environmental irritants; (5) nonasthmatic eosinophilic bronchitis; and (6) bronchiectasis.   These conditions, singly or in combination, have accounted for up to 94% of the causes of chronic cough in prospective studies.   Other conditions have constituted no >6% of the causes in prospective studies These have included bronchogenic carcinoma, chronic interstitial pneumonia, sarcoidosis, left ventricular failure, ACEI-induced cough, and aspiration from a condition associated with pharyngeal dysfunction.    Chronic cough is often simultaneously caused by more than one condition. A single cause has been found from 38 to 82% of the time, multiple causes from 18 to 62%. Multiply caused cough has been the result of three diseases up to 42% of the time.   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   These are the same pts (now being labeled as having "irritable larynx syndrome" by some cough centers) who not infrequently have a history of having failed to tolerate ace inhibitors,  dry powder inhalers or biphosphonates or report having atypical reflux symptoms that don't respond to standard doses of PPI , and are easily confused as having aecopd  or asthma flares by even experienced allergists/ pulmonologists.   Rec rx with augmentin then regroup with sinus ct if not better

## 2011-08-10 ENCOUNTER — Telehealth: Payer: Self-pay | Admitting: Internal Medicine

## 2011-08-10 NOTE — Telephone Encounter (Signed)
Let's try for Thursday April 25 830 am

## 2011-08-10 NOTE — Telephone Encounter (Signed)
Will need to call for bronch @ [facility] n the morning, [phone].

## 2011-08-10 NOTE — Telephone Encounter (Signed)
Called and spoke with pt. Pt is calling to get bronch scheduled.  Pt states he is free to have bronch scheduled any day this week.  If bronch needs to be done next week it needs to either be on thurs or Friday.  Will forward message to MW to address.

## 2011-08-11 NOTE — Telephone Encounter (Signed)
I called [facility] and spoke with michelle. Bronch is scheduled for 08/13/11 at 8:30 at [facility]. MW do you want fluoro? If so will need to call michelle back. Also I spoke with pt and is aware bronch has been scheduled and per Marcelino Duster they will be calling him tomorrow with all the information. Please advise Dr. Sherene Sires, thanks thanks

## 2011-08-11 NOTE — Telephone Encounter (Signed)
No fluoro, thx

## 2011-08-11 NOTE — Telephone Encounter (Signed)
Called, spoke with Daniel Carey and advised no fluoro needed per MW.  She verbalized understanding and nothing further needed at this time.

## 2011-08-13 ENCOUNTER — Encounter (HOSPITAL_COMMUNITY)
Admission: RE | Disposition: A | Discharge status: Home / Self Care | Payer: Self-pay | Source: Ambulatory Visit | Attending: Internal Medicine

## 2011-08-13 ENCOUNTER — Encounter (HOSPITAL_COMMUNITY): Payer: BC Managed Care – PPO

## 2011-08-13 ENCOUNTER — Encounter (HOSPITAL_COMMUNITY): Payer: Self-pay | Admitting: Radiology

## 2011-08-13 ENCOUNTER — Ambulatory Visit (HOSPITAL_COMMUNITY)
Admission: RE | Admit: 2011-08-13 | Discharge: 2011-08-13 | Disposition: A | Discharge status: Home / Self Care | Payer: BC Managed Care – PPO | Source: Ambulatory Visit | Attending: Internal Medicine | Admitting: Internal Medicine

## 2011-08-13 DIAGNOSIS — R0789 Other chest pain: Secondary | ICD-10-CM | POA: Insufficient documentation

## 2011-08-13 DIAGNOSIS — R042 Hemoptysis: Secondary | ICD-10-CM | POA: Insufficient documentation

## 2011-08-13 DIAGNOSIS — J3489 Other specified disorders of nose and nasal sinuses: Secondary | ICD-10-CM | POA: Insufficient documentation

## 2011-08-13 HISTORY — PX: VIDEO BRONCHOSCOPY: SHX5072

## 2011-08-13 SURGERY — VIDEO BRONCHOSCOPY WITHOUT FLUORO
Anesthesia: Moderate Sedation | Laterality: Bilateral

## 2011-08-13 MED ORDER — PHENYLEPHRINE HCL 0.25 % NA SOLN
1.0000 | Freq: Four times a day (QID) | NASAL | Status: DC | PRN
  Administered 2011-08-13: 1 via NASAL
  Filled 2011-08-13: qty 15

## 2011-08-13 MED ORDER — MIDAZOLAM HCL 10 MG/2ML IJ SOLN
INTRAMUSCULAR | Status: AC
  Filled 2011-08-13: qty 4

## 2011-08-13 MED ORDER — LIDOCAINE HCL 1 % IJ SOLN
INTRAMUSCULAR | Status: DC | PRN
  Administered 2011-08-13: 6 mL via RESPIRATORY_TRACT

## 2011-08-13 MED ORDER — LIDOCAINE HCL 2 % EX GEL
Freq: Once | CUTANEOUS | Status: AC
  Administered 2011-08-13: 08:00:00 via TOPICAL

## 2011-08-13 MED ORDER — MEPERIDINE HCL 100 MG/ML IJ SOLN
INTRAMUSCULAR | Status: AC
  Filled 2011-08-13: qty 2

## 2011-08-13 MED ORDER — MEPERIDINE HCL 25 MG/ML IJ SOLN
INTRAMUSCULAR | Status: DC | PRN
  Administered 2011-08-13: 50 mg via INTRAVENOUS

## 2011-08-13 MED ORDER — MIDAZOLAM HCL 10 MG/2ML IJ SOLN
INTRAMUSCULAR | Status: DC | PRN
  Administered 2011-08-13 (×2): 2.5 mg via INTRAVENOUS

## 2011-08-13 MED ORDER — SODIUM CHLORIDE 0.9 % IV SOLN
INTRAVENOUS | Status: DC
  Administered 2011-08-13: 20 mL/h via INTRAVENOUS

## 2011-08-13 NOTE — Op Note (Addendum)
Bronchoscopy Procedure Note  Date of Operation: 08/13/2011  Pre-op Diagnosis: Hemoptysis ? source  Post-op Diagnosis: Hemoptysis ? source  Surgeon: Sandrea Hughs  Anesthesia: Monitored Local Anesthesia with Sedation Demerol 50 mg IV/ Versed 5 mg IV   Operation: Video Flexible fiberoptic bronchoscopy, diagnostic   Findings: Nl airways, no source hemoptysis  Specimen: BAL  Estimated Blood Loss: Minimal  Complications: none  Indications and History: The patient is a 61 y.o. male smoker with recurrent hemopytis.The risks, benefits, complications, treatment options and expected outcomes were discussed with the patient.  The possibilities of reaction to medication, pulmonary aspiration, perforation of a viscus, bleeding, failure to diagnose a condition and creating a complication requiring transfusion or operation were discussed with the patient who freely signed the consent.    Description of Procedure: The patient was re-examined in the bronchoscopy suite and the site of surgery properly noted/marked.  The patient was identified as Daniel Carey and the procedure verified as Flexible Fiberoptic Bronchoscopy.  A Time Out was held and the above information confirmed.   After the induction of topical nasopharyngeal anesthesia, the patient was positioned  and the bronchoscope was passed through the R  naris. The vocal cords were visualized and  1% buffered lidocaine 5 ml was topically placed onto the cords. The cords were nl. The scope was then passed into the trachea.  1% buffered lidocaine 5 ml was used topically on the carina.  Careful inspection of the tracheal lumen was accomplished. The scope was sequentially passed into all airways bilaterally to the subsegmental level with the following findings:  1) all airways nl with no source of hemoptysis identified  Specimen Bronchial lavage specimens, generalized, for cytology only  The Patient was taken to the Endoscopy Recovery area in  satisfactory condition.  Attestation: I performed the procedure.    Sandrea Hughs, MD Pulmonary and Critical Care Medicine [facility] Cell [phone]

## 2011-08-13 NOTE — Progress Notes (Signed)
Fiberoptic bronch with video intervention bronchial washings

## 2011-08-13 NOTE — Discharge Instructions (Signed)
Bronchoscopy Care After These instructions give you information on caring for yourself after your procedure. Your doctor may also give you specific instructions. Call your doctor if you have any problems or questions after your procedure. HOME CARE  Do not eat or drink anything for 2 hours after your test. Your nose and throat was numbed by medicine. If you try to eat or drink before the medicine wears off, food or drink could go into your lungs.   For the rest of the first day, eat soft food and drink liquids slowly.   On the day after the test, you can go back to eating your usual food.   Do not drive or sign important papers the day of the test.   Take it easy for the next 2 days. Do not do any heavy work, exercise, or activities.   Only take medicine as told by your doctor. Do not take aspirin.   You may be drowsy for the next 24 hours.   You may see traces of blood in your spit for 1 to 2 days.  Finding out the results of your test Ask when your test results will be ready. Make sure you get your test results. GET HELP RIGHT AWAY IF:  You have breathing problems.   You have a bad sore throat for more than 1 week.   You see traces of blood in your spit for more than 3 days.   You start coughing up blood.   You have a temperature of 102 F (38.9 C) or higher.  MAKE SURE YOU:  Understand these instructions.   Will watch your condition.   Will get help right away if you are not doing well or get worse.  Document Released: 02/01/2009 Document Revised: 03/26/2011 Document Reviewed: 02/01/2009 Exit[employer] Patient Information 2012 [employer]. 

## 2011-08-13 NOTE — H&P (Signed)
67 yowm smoker frequent flyer including international with pattern of increased frequency and severity typically in winter x 2008 and since 2010 noted some streaky blood quantity more pronounced especially since abrupt onset mid March 15th 2013 referred 07/28/2011 to pulmonary clinic.  07/28/2011 1st pulmonary ov cc persistent hemoptysis x 3 weeks better transiently p avelox and nasal drainage esp lie down occ with epistaxis and chest discomfort from coughing. Mucus is mostly white, blood is less a tbsp per day now. No sob though not aerobically active  Sleeping ok without nocturnal or early am exacerbation of respiratory c/o's or need for noct saba. Also denies any obvious fluctuation of symptoms with weather or environmental changes or other aggravating or alleviating factors except as outlined above  Review of Systems  Constitutional: Negative for fever, chills, activity change, appetite change and unexpected weight change.  HENT: Positive for congestion and sneezing. Negative for sore throat, rhinorrhea, trouble swallowing, dental problem, voice change and postnasal drip.  Eyes: Negative for visual disturbance.  Respiratory: Positive for cough. Negative for choking and shortness of breath.  Cardiovascular: Positive for chest pain. Negative for leg swelling.  Gastrointestinal: Negative for nausea, vomiting and abdominal pain.  Genitourinary: Negative for difficulty urinating.  Musculoskeletal: Negative for arthralgias.  Skin: Negative for rash.  Psychiatric/Behavioral: Negative for behavioral problems and confusion.    Objective:   Physical Exam  Stoic wm nad  Wt 202 07/28/2011  HEENT mild turbinate edema. Oropharynx no thrush or excess pnd or cobblestoning. No JVD or cervical adenopathy. Mild accessory muscle hypertrophy. Trachea midline, nl thryroid. Chest was hyperinflated by percussion with diminished breath sounds and moderate increased exp time without wheeze. Hoover sign positive at mid  inspiration. Regular rate and rhythm without murmur gallop or rub or increase P2 or edema. Abd: no hsm, nl excursion. Ext warm without cyanosis or clubbing.  Ct chest 07/14/11  Scattered bilateral pulmonary nodules, measuring up to 4-5 mm in  the right upper lobe, unchanged from 2010, benign.  Underlying moderate emphysematous changes. No superimposed  opacities suspicious for pneumonia.  Bilateral adrenal adenomas. Splenic granulomata.   Assessment & Plan:    Previous Version  Cough - Sandrea Hughs, MD 07/29/2011 9:45 AM Signed  The most common causes of chronic cough in immunocompetent adults include the following: upper airway cough syndrome (UACS), previously referred to as postnasal drip syndrome (PNDS), which is caused by variety of rhinosinus conditions; (2) asthma; (3) GERD; (4) chronic bronchitis from cigarette smoking or other inhaled environmental irritants; (5) nonasthmatic eosinophilic bronchitis; and (6) bronchiectasis.  These conditions, singly or in combination, have accounted for up to 94% of the causes of chronic cough in prospective studies.  Other conditions have constituted no >6% of the causes in prospective studies These have included bronchogenic carcinoma, chronic interstitial pneumonia, sarcoidosis, left ventricular failure, ACEI-induced cough, and aspiration from a condition associated with pharyngeal dysfunction.  Chronic cough is often simultaneously caused by more than one condition. A single cause has been found from 38 to 82% of the time, multiple causes from 18 to 62%. Multiply caused cough has been the result of three diseases up to 42% of the time.  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  These are the same pts (now being labeled as having "irritable larynx syndrome" by some cough centers) who not infrequently have a history of having failed to tolerate ace inhibitors, dry powder inhalers or biphosphonates or report having atypical reflux symptoms that don't respond to standard doses of PPI , and are easily confused as having aecopd or asthma flares by even experienced allergists/ pulmonologists.  Rec rx with augmentin then regroup with sinus ct if not better   Hemoptysis - Sandrea Hughs, MD 07/29/2011 9:46 AM Signed  Most likely from sinus dz but can't be 100% sure about the source s fob, especially given chronicity which is of concern in active smoker  Discussed in detail all the indications, usual risks and alternatives relative to the benefits with patient who agrees to proceed with bronchoscopy when his travel schedule will permit  TOBACCO USE - Sandrea Hughs, MD 07/29/2011 9:47 AM Signed  Pt not amenable to discussion " it's not just the cigarettes that cause lung problems"

## 2011-08-14 ENCOUNTER — Encounter (HOSPITAL_COMMUNITY): Payer: Self-pay | Admitting: Internal Medicine

## 2011-08-14 NOTE — Progress Notes (Signed)
Quick Note:  Spoke with pt and notified of results per Dr. Wert. Pt verbalized understanding and denied any questions.  ______ 

## 2011-08-20 ENCOUNTER — Encounter (HOSPITAL_COMMUNITY): Payer: BC Managed Care – PPO

## 2011-09-03 ENCOUNTER — Other Ambulatory Visit: Payer: Self-pay | Admitting: Gastroenterology

## 2011-09-25 ENCOUNTER — Ambulatory Visit (INDEPENDENT_AMBULATORY_CARE_PROVIDER_SITE_OTHER): Payer: BC Managed Care – PPO | Admitting: Cardiovascular Disease

## 2011-09-25 ENCOUNTER — Encounter: Payer: Self-pay | Admitting: Cardiovascular Disease

## 2011-09-25 VITALS — BP 120/80 | HR 72 | Resp 18 | Ht 71.0 in | Wt 198.4 lb

## 2011-09-25 DIAGNOSIS — E785 Hyperlipidemia, unspecified: Secondary | ICD-10-CM

## 2011-09-25 LAB — LIPID PANEL
Cholesterol: 164 mg/dL (ref 0–200)
HDL: 45 mg/dL (ref >39.00)
LDL Cholesterol: 90 mg/dL (ref 0–99)
Total CHOL/HDL Ratio: 4
Triglycerides: 147 mg/dL (ref 0.0–149.0)
VLDL: 29.4 mg/dL (ref 0.0–40.0)

## 2011-09-25 LAB — BASIC METABOLIC PANEL
BUN: 18 mg/dL (ref 6–23)
CO2: 29 mEq/L (ref 19–32)
Calcium: 9.2 mg/dL (ref 8.4–10.5)
Chloride: 103 mEq/L (ref 96–112)
Creatinine, Ser: 0.9 mg/dL (ref 0.4–1.5)
GFR: 95.17 mL/min (ref >60.00)
Glucose, Bld: 101 mg/dL — ABNORMAL HIGH (ref 70–99)
Potassium: 4.4 mEq/L (ref 3.5–5.1)
Sodium: 137 mEq/L (ref 135–145)

## 2011-09-25 LAB — HEPATIC FUNCTION PANEL
ALT: 26 U/L (ref 0–53)
AST: 16 U/L (ref 0–37)
Albumin: 3.9 g/dL (ref 3.5–5.2)
Alkaline Phosphatase: 57 U/L (ref 39–117)
Bilirubin, Direct: 0.1 mg/dL (ref 0.0–0.3)
Total Bilirubin: 0.9 mg/dL (ref 0.3–1.2)
Total Protein: 6.9 g/dL (ref 6.0–8.3)

## 2011-09-25 NOTE — Progress Notes (Signed)
Daniel Carey Date of Birth  [birth year]       [facility]    [place] [address]  [address] [place], [place]  [address]   [place], [place]  [address] [phone]     [phone]   Fax  [phone]    Fax [phone]  Problem List: 1. CAD 2. Bradycardia after getting propafol for endoscopy. 3.   History of Present Illness:  Daniel Carey is feeling well today.  He's been getting some exercise. He does yard work and walks on the treadmill occasionally.  He had an episode of bradycardia after getting IV protocol for an endoscopy procedure. He was seen by Lawson Fiscal. His metoprolol dose was decreased.  He has also had a bronchoscopy with Dr. Sherene Sires. He does not have any evidence of COPD.  Both he and his wife have stop smoking.  ( Aug 20, 2011)    Current Outpatient Prescriptions on File Prior to Visit  Medication Sig Dispense Refill  . ACIPHEX 20 MG tablet TAKE 1 TABLET DAILY  30 each  6  . aspirin 81 MG tablet Take 81 mg by mouth daily.        . famotidine (PEPCID) 20 MG tablet One at bedtime  30 tablet  11  . hydrochlorothiazide (HYDRODIURIL) 25 MG tablet TAKE 1 TABLET DAILY.  30 tablet  6  . LIPITOR 80 MG tablet TAKE 1 TABLET DAILY  90 each  3  . NITROSTAT 0.4 MG SL tablet TAKE 1 TABLET UNDER TONGUE EVERY 5 MINUTES UP TO 3 TIMES AS NEEDED FOR CHEST PAIN  25 each  11  . TRILIPIX 135 MG capsule TAKE (1) CAPSULE DAILY  30 each  6  . vitamin E (VITAMIN E) 400 UNIT capsule Take 800 Units by mouth daily. Takes 2 capsules daily      . tiotropium (SPIRIVA) 18 MCG inhalation capsule Place 18 mcg into inhaler and inhale as needed.        Marland Kitchen DISCONTD: tiotropium (SPIRIVA HANDIHALER) 18 MCG inhalation capsule Place 1 capsule (18 mcg total) into inhaler and inhale daily.  30 capsule  2    Allergies  Allergen Reactions  . Lisinopril Cough  . Amlodipine Besylate Other (See Comments)    Headache, weakness, dizziness  . Rosuvastatin Other (See  Comments)    REACTION: muscle aches    Past Medical History  Diagnosis Date  . CAD (coronary artery disease)     moderate to severe distal CAD with large epicardial arteries having only mild to moderate irregularities. Last cath in Dec 2006   . Chest pain   . HTN (hypertension)   . HLD (hyperlipidemia)   . GERD (gastroesophageal reflux disease)   . COPD (chronic obstructive pulmonary disease)   . Hiatal hernia   . External hemorrhoids without mention of complication   . Headache   . Nephrolithiasis   . ED (erectile dysfunction)   . Personal history of colonic polyps 03/12/2010    TUBULAR ADENOMA  . Esophageal stricture   . Tobacco abuse   . AV block, Mobitz 1     noted on EKG March 2013    Past Surgical History  Procedure Date  . Angioplasty     X3  . Video bronchoscopy 08/13/2011    Procedure: VIDEO BRONCHOSCOPY WITHOUT FLUORO;  Surgeon: Nyoka Cowden, MD;  Location: [facility];  Service: Cardiopulmonary;  Laterality: Bilateral;    History  Smoking status  . Former  Smoker -- 0.5 packs/day for 40 years  . Types: Cigarettes  . Quit date: 08/25/2011  Smokeless tobacco  . Never Used    History  Alcohol Use  . 0.0 oz/week    rare    Family History  Problem Relation Age of Onset  . Heart disease Father     hx of MI  . Diabetes Mother     and brother   . Colon cancer Neg Hx   . Stomach cancer Neg Hx   . Breast cancer Sister     Reviw of Systems:  Reviewed in the HPI.  All other systems are negative.  Physical Exam: Blood pressure 120/80, pulse 72, resp. rate 18, height 5\' 11"  (1.803 m), weight 198 lb 6.4 oz (89.994 kg). General: Well developed, well nourished, in no acute distress.  Head: Normocephalic, atraumatic, sclera non-icteric, mucus membranes are moist,   Neck: Supple. Carotids are 2 + without bruits. No JVD  Lungs: Clear bilaterally to auscultation.  Heart: regular rate.  normal  S1 S2. No murmurs, gallops or rubs.  Abdomen: Soft,  non-tender, non-distended with normal bowel sounds. No hepatomegaly. No rebound/guarding. No masses.  Msk:  Strength and tone are normal  Extremities: No clubbing or cyanosis. No edema.  Distal pedal pulses are 2+ and equal bilaterally.  Neuro: Alert and oriented X 3. Moves all extremities spontaneously.  Psych:  Responds to questions appropriately with a normal affect.  ECG:  Assessment / Plan:

## 2011-09-25 NOTE — Patient Instructions (Signed)
Your physician wants you to follow-up in: 6 months You will receive a reminder letter in the mail two months in advance. If you don't receive a letter, please call our office to schedule the follow-up appointment.   Your physician recommends that you return for a FASTING lipid profile: today and in 6 months  TAKE OMEGA 3 AND FISH OIL AVOID OMEGA 6

## 2011-09-25 NOTE — Assessment & Plan Note (Signed)
Daniel Carey is doing very well. He has continued to have some intermittent chest pains. We've done numerous cardiac evaluations. He does not have obstructive CAD. He does have  mild/moderate CAD.  He has stopped smoking since I last saw him. He's overall feeling quite a bit better. His breathing is quite a bit better.  We'll set him up to have fasting labs today. I'll see him again in 6 months and we'll check fasting labs at that time.

## 2011-09-30 ENCOUNTER — Telehealth: Payer: Self-pay | Admitting: Cardiovascular Disease

## 2011-09-30 NOTE — Telephone Encounter (Signed)
Labs reviewed and copy mailed

## 2011-09-30 NOTE — Telephone Encounter (Signed)
Please return call to patient wife Eber Jones [phone] regarding patient labs

## 2012-03-11 ENCOUNTER — Other Ambulatory Visit: Payer: Self-pay | Admitting: Dermatology

## 2012-03-24 ENCOUNTER — Other Ambulatory Visit: Payer: Self-pay | Admitting: Internal Medicine

## 2012-03-24 ENCOUNTER — Other Ambulatory Visit: Payer: Self-pay | Admitting: Gastroenterology

## 2012-03-24 NOTE — Telephone Encounter (Signed)
PLEASE MAKE A FOLLOW UP APPOINTMENT FOR FURTHER REFILLS

## 2012-04-21 ENCOUNTER — Other Ambulatory Visit: Payer: Self-pay | Admitting: Gastroenterology

## 2012-06-06 ENCOUNTER — Telehealth: Payer: Self-pay | Admitting: *Deleted

## 2012-06-06 DIAGNOSIS — E785 Hyperlipidemia, unspecified: Secondary | ICD-10-CM

## 2012-06-06 NOTE — Telephone Encounter (Signed)
Message copied by Antony Odea on Mon Jun 06, 2012  3:46 PM ------      Message from: Roswell Miners B      Created: Mon Jun 06, 2012  1:16 PM      Regarding: Lab orders       Please place orders for patient's lab appt. on 06/08/12. ------

## 2012-06-06 NOTE — Telephone Encounter (Signed)
Orders completed

## 2012-06-08 ENCOUNTER — Ambulatory Visit (INDEPENDENT_AMBULATORY_CARE_PROVIDER_SITE_OTHER): Payer: BC Managed Care – PPO | Admitting: *Deleted

## 2012-06-08 DIAGNOSIS — E785 Hyperlipidemia, unspecified: Secondary | ICD-10-CM

## 2012-06-08 LAB — BASIC METABOLIC PANEL
BUN: 21 mg/dL (ref 6–23)
CO2: 28 mEq/L (ref 19–32)
Calcium: 9.5 mg/dL (ref 8.4–10.5)
Chloride: 103 mEq/L (ref 96–112)
Creatinine, Ser: 1.2 mg/dL (ref 0.4–1.5)
GFR: 64.89 mL/min (ref >60.00)
Glucose, Bld: 119 mg/dL — ABNORMAL HIGH (ref 70–99)
Potassium: 4.6 mEq/L (ref 3.5–5.1)
Sodium: 138 mEq/L (ref 135–145)

## 2012-06-08 LAB — LIPID PANEL
Cholesterol: 161 mg/dL (ref 0–200)
HDL: 42.4 mg/dL (ref >39.00)
LDL Cholesterol: 93 mg/dL (ref 0–99)
Total CHOL/HDL Ratio: 4
Triglycerides: 126 mg/dL (ref 0.0–149.0)
VLDL: 25.2 mg/dL (ref 0.0–40.0)

## 2012-06-08 LAB — HEPATIC FUNCTION PANEL
ALT: 33 U/L (ref 0–53)
AST: 23 U/L (ref 0–37)
Albumin: 4 g/dL (ref 3.5–5.2)
Alkaline Phosphatase: 43 U/L (ref 39–117)
Bilirubin, Direct: 0 mg/dL (ref 0.0–0.3)
Total Bilirubin: 0.6 mg/dL (ref 0.3–1.2)
Total Protein: 7.3 g/dL (ref 6.0–8.3)

## 2012-06-08 LAB — TSH: TSH: 1.56 u[IU]/mL (ref 0.35–5.50)

## 2012-06-17 ENCOUNTER — Ambulatory Visit (INDEPENDENT_AMBULATORY_CARE_PROVIDER_SITE_OTHER): Payer: BC Managed Care – PPO | Admitting: Cardiovascular Disease

## 2012-06-17 ENCOUNTER — Encounter: Payer: Self-pay | Admitting: Cardiovascular Disease

## 2012-06-17 ENCOUNTER — Encounter: Payer: Self-pay | Admitting: *Deleted

## 2012-06-17 VITALS — BP 110/80 | HR 71 | Ht 71.0 in | Wt 198.0 lb

## 2012-06-17 DIAGNOSIS — I2510 Atherosclerotic heart disease of native coronary artery without angina pectoris: Secondary | ICD-10-CM

## 2012-06-17 DIAGNOSIS — J449 Chronic obstructive pulmonary disease, unspecified: Secondary | ICD-10-CM

## 2012-06-17 DIAGNOSIS — E785 Hyperlipidemia, unspecified: Secondary | ICD-10-CM

## 2012-06-17 DIAGNOSIS — Z01810 Encounter for preprocedural cardiovascular examination: Secondary | ICD-10-CM

## 2012-06-17 NOTE — Assessment & Plan Note (Signed)
Daniel Carey presents today with worsening chest pain. Chest pain for the past several weeks. His appetite nitroglycerin glycerin multiple times a day.  This also corresponds to a gradual up titration of his Synthroid dose.  Given his symptoms, I worry that he has worsening of his coronary artery disease. His last heart catheterization was in 2006.  We'll schedule him for heart catheterization on March 11. We will have him cut his Synthroid dose in half. He'll need to call his medical doctor for further recommendations of this. We'll get precath labs next week.

## 2012-06-17 NOTE — Progress Notes (Signed)
  Daniel Carey Date of Birth  11/05/1950       [place] Office    [place] [facility] [address], [address]  [address], [address] [place], [place]  [address]   [place], [place]  [address] [phone]     [phone]   Fax  [phone]    Fax [phone]  Problem List: 1. CAD 2. Bradycardia after getting propafol for endoscopy. 3. Hypothyroidism   History of Present Illness:  Daniel Carey is feeling well today.  He's been getting some exercise. He does yard work and walks on the treadmill occasionally.  He had an episode of bradycardia after getting IV protocol for an endoscopy procedure. He was seen by Lori. His metoprolol dose was decreased.  He has also had a bronchoscopy with Dr. Wert. He does not have any evidence of COPD.  Both he and his wife have stop smoking.  ( Aug 20, 2011)  Feb. 28, 2014:  He continues to have increasing chest pain.  He has not been smoking.  The pains are associated with exertion, last 5-10 minutes, resolve with 1 SL NTG.   He's had a mildly elevated TSH for the past year or so and has had some fatigue.    his TSH level 6.8.      His medical doctor started him on Synthroid. He gradually has titrated up to 0.1 mg a day. Since that time he's had increasing episodes of chest pain.   Current Outpatient Prescriptions on File Prior to Visit  Medication Sig Dispense Refill  . ACIPHEX 20 MG tablet TAKE 1 TABLET DAILY  30 tablet  6  . aspirin 81 MG tablet Take 81 mg by mouth daily.        . famotidine (PEPCID) 20 MG tablet One at bedtime  30 tablet  11  . hydrochlorothiazide (HYDRODIURIL) 25 MG tablet TAKE 1 TABLET DAILY.  30 tablet  6  . LIPITOR 80 MG tablet TAKE 1 TABLET DAILY  90 tablet  0  . metoprolol (LOPRESSOR) 50 MG tablet Take 50 mg by mouth 2 (two) times daily.      . NITROSTAT 0.4 MG SL tablet TAKE 1 TABLET UNDER TONGUE EVERY 5 MINUTES UP TO 3 TIMES AS NEEDED FOR CHEST PAIN  25 each  11  . TRILIPIX 135 MG capsule TAKE (1) CAPSULE  DAILY  30 each  6   No current facility-administered medications on file prior to visit.    Allergies  Allergen Reactions  . Lisinopril Cough  . Amlodipine Besylate Other (See Comments)    Headache, weakness, dizziness  . Rosuvastatin Other (See Comments)    REACTION: muscle aches    Past Medical History  Diagnosis Date  . CAD (coronary artery disease)     moderate to severe distal CAD with large epicardial arteries having only mild to moderate irregularities. Last cath in Dec 2006   . Chest pain   . HTN (hypertension)   . HLD (hyperlipidemia)   . GERD (gastroesophageal reflux disease)   . COPD (chronic obstructive pulmonary disease)   . Hiatal hernia   . External hemorrhoids without mention of complication   . Headache   . Nephrolithiasis   . ED (erectile dysfunction)   . Personal history of colonic polyps 03/12/2010    TUBULAR ADENOMA  . Esophageal stricture   . Tobacco abuse   . AV block, Mobitz 1     noted on EKG March 2013    Past Surgical History    Procedure Laterality Date  . Angioplasty      X3  . Video bronchoscopy  08/13/2011    Procedure: VIDEO BRONCHOSCOPY WITHOUT FLUORO;  Surgeon: Michael B Wert, MD;  Location: [facility];  Service: Cardiopulmonary;  Laterality: Bilateral;    History  Smoking status  . Former Smoker -- 0.50 packs/day for 40 years  . Types: Cigarettes  . Quit date: 08/25/2011  Smokeless tobacco  . Never Used    History  Alcohol Use  . 0.0 oz/week    Comment: rare    Family History  Problem Relation Age of Onset  . Heart disease Father     hx of MI  . Diabetes Mother     and brother   . Colon cancer Neg Hx   . Stomach cancer Neg Hx   . Breast cancer Sister     Reviw of Systems:  Reviewed in the HPI.  All other systems are negative.  Physical Exam: Blood pressure 110/80, pulse 71, height 5' 11" (1.803 m), weight 198 lb (89.812 kg). General: Well developed, well nourished, in no acute distress.  Head:  Normocephalic, atraumatic, sclera non-icteric, mucus membranes are moist,   Neck: Supple. Carotids are 2 + without bruits. No JVD  Lungs: Clear bilaterally to auscultation.  Heart: regular rate.  normal  S1 S2. No murmurs, gallops or rubs.  Abdomen: Soft, non-tender, non-distended with normal bowel sounds. No hepatomegaly. No rebound/guarding. No masses.  Msk:  Strength and tone are normal  Extremities: No clubbing or cyanosis. No edema.  Distal pedal pulses are 2+ and equal bilaterally.  Neuro: Alert and oriented X 3. Moves all extremities spontaneously.  Psych:  Responds to questions appropriately with a normal affect.  ECG: Feb. 28,  2014:  NSR with 1st degree AV block,  Poor R wave progression. Assessment / Plan:   

## 2012-06-17 NOTE — Assessment & Plan Note (Signed)
He is LDL cholesterol is 92. He's on Lipitor 80 mg a day. We've tried him on Crestor but he actually had better results on Lipitor. We'll continue with his current medications.

## 2012-06-17 NOTE — Patient Instructions (Addendum)
Heart cath set for 06/28/12 see letter

## 2012-06-21 ENCOUNTER — Other Ambulatory Visit (INDEPENDENT_AMBULATORY_CARE_PROVIDER_SITE_OTHER): Payer: BC Managed Care – PPO

## 2012-06-21 ENCOUNTER — Other Ambulatory Visit: Payer: Self-pay | Admitting: *Deleted

## 2012-06-21 DIAGNOSIS — Z01818 Encounter for other preprocedural examination: Secondary | ICD-10-CM

## 2012-06-21 DIAGNOSIS — R079 Chest pain, unspecified: Secondary | ICD-10-CM

## 2012-06-21 LAB — BASIC METABOLIC PANEL
BUN: 23 mg/dL (ref 6–23)
CO2: 27 mEq/L (ref 19–32)
Calcium: 9.6 mg/dL (ref 8.4–10.5)
Chloride: 100 mEq/L (ref 96–112)
Creatinine, Ser: 1.3 mg/dL (ref 0.4–1.5)
GFR: 59.2 mL/min — ABNORMAL LOW (ref >60.00)
Glucose, Bld: 117 mg/dL — ABNORMAL HIGH (ref 70–99)
Potassium: 4 mEq/L (ref 3.5–5.1)
Sodium: 135 mEq/L (ref 135–145)

## 2012-06-21 LAB — CBC
HCT: 43.3 % (ref 39.0–52.0)
Hemoglobin: 14.9 g/dL (ref 13.0–17.0)
MCHC: 34.3 g/dL (ref 30.0–36.0)
MCV: 89.9 fl (ref 78.0–100.0)
Platelets: 291 10*3/uL (ref 150.0–400.0)
RBC: 4.82 Mil/uL (ref 4.22–5.81)
RDW: 13.6 % (ref 11.5–14.6)
WBC: 8 10*3/uL (ref 4.5–10.5)

## 2012-06-21 LAB — PROTIME-INR
INR: 1.1 ratio — ABNORMAL HIGH (ref 0.8–1.0)
Prothrombin Time: 11.9 s (ref 10.2–12.4)

## 2012-06-27 ENCOUNTER — Telehealth: Payer: Self-pay | Admitting: Cardiovascular Disease

## 2012-06-27 ENCOUNTER — Other Ambulatory Visit: Payer: Self-pay | Admitting: Cardiovascular Disease

## 2012-06-27 DIAGNOSIS — I2510 Atherosclerotic heart disease of native coronary artery without angina pectoris: Secondary | ICD-10-CM

## 2012-06-27 NOTE — Telephone Encounter (Signed)
Per Verl Blalock no PA need for heart cath, pts wife informed, she is anxious about it and I reassured her but she remained anxious, told her to call her insurance, she will do that and if we are incorrect they will cancel heart cath.

## 2012-06-27 NOTE — Telephone Encounter (Signed)
lhc [id number] given

## 2012-06-27 NOTE — Telephone Encounter (Signed)
Follow up call    Need a CPT code for the upcoming procedures

## 2012-06-27 NOTE — Telephone Encounter (Signed)
New Problem:    Patient's wife called in wanting to know if her husbands procedure on 06/28/12 has been pre-authorized.  Please call back.

## 2012-06-28 ENCOUNTER — Encounter (HOSPITAL_BASED_OUTPATIENT_CLINIC_OR_DEPARTMENT_OTHER)
Admission: RE | Disposition: A | Discharge status: Home / Self Care | Payer: Self-pay | Source: Ambulatory Visit | Attending: Cardiovascular Disease

## 2012-06-28 ENCOUNTER — Inpatient Hospital Stay (HOSPITAL_BASED_OUTPATIENT_CLINIC_OR_DEPARTMENT_OTHER)
Admission: RE | Admit: 2012-06-28 | Discharge: 2012-06-28 | Disposition: A | Discharge status: Home / Self Care | Payer: BC Managed Care – PPO | Source: Ambulatory Visit | Attending: Cardiovascular Disease | Admitting: Cardiovascular Disease

## 2012-06-28 ENCOUNTER — Encounter (HOSPITAL_BASED_OUTPATIENT_CLINIC_OR_DEPARTMENT_OTHER): Payer: Self-pay

## 2012-06-28 ENCOUNTER — Encounter (HOSPITAL_COMMUNITY): Payer: Self-pay | Admitting: Pharmacy Technician

## 2012-06-28 DIAGNOSIS — J4489 Other specified chronic obstructive pulmonary disease: Secondary | ICD-10-CM | POA: Insufficient documentation

## 2012-06-28 DIAGNOSIS — I2510 Atherosclerotic heart disease of native coronary artery without angina pectoris: Secondary | ICD-10-CM

## 2012-06-28 DIAGNOSIS — R079 Chest pain, unspecified: Secondary | ICD-10-CM | POA: Insufficient documentation

## 2012-06-28 DIAGNOSIS — J449 Chronic obstructive pulmonary disease, unspecified: Secondary | ICD-10-CM | POA: Insufficient documentation

## 2012-06-28 DIAGNOSIS — I10 Essential (primary) hypertension: Secondary | ICD-10-CM | POA: Insufficient documentation

## 2012-06-28 SURGERY — JV LEFT HEART CATHETERIZATION WITH CORONARY ANGIOGRAM
Anesthesia: Moderate Sedation

## 2012-06-28 MED ORDER — SODIUM CHLORIDE 0.9 % IV SOLN
250.0000 mL | INTRAVENOUS | Status: DC | PRN
Start: 2012-06-28 — End: 2012-06-28

## 2012-06-28 MED ORDER — SODIUM CHLORIDE 0.9 % IJ SOLN: 3.0000 mL | INTRAMUSCULAR | Status: DC | PRN

## 2012-06-28 MED ORDER — SODIUM CHLORIDE 0.9 % IV SOLN
INTRAVENOUS | Status: DC
  Administered 2012-06-28: 09:00:00 via INTRAVENOUS

## 2012-06-28 MED ORDER — SODIUM CHLORIDE 0.9 % IJ SOLN: 3.0000 mL | Freq: Two times a day (BID) | INTRAMUSCULAR | Status: DC

## 2012-06-28 MED ORDER — SODIUM CHLORIDE 0.9 % IV SOLN: INTRAVENOUS | Status: DC

## 2012-06-28 MED ORDER — ACETAMINOPHEN 325 MG PO TABS: 650.0000 mg | ORAL_TABLET | ORAL | Status: DC | PRN

## 2012-06-28 MED ORDER — ONDANSETRON HCL 4 MG/2ML IJ SOLN: 4.0000 mg | Freq: Four times a day (QID) | INTRAMUSCULAR | Status: DC | PRN

## 2012-06-28 MED ORDER — ASPIRIN 81 MG PO CHEW
324.0000 mg | CHEWABLE_TABLET | ORAL | Status: AC
  Administered 2012-06-28: 324 mg via ORAL

## 2012-06-28 NOTE — H&P (View-Only) (Signed)
Daniel Carey Date of Birth  [birth year]       [facility]    [place] [address]  [address] [place], [place]  [address]   [place], [place]  [address] [phone]     [phone]   Fax  [phone]    Fax [phone]  Problem List: 1. CAD 2. Bradycardia after getting propafol for endoscopy. 3. Hypothyroidism   History of Present Illness:  Daniel Carey is feeling well today.  He's been getting some exercise. He does yard work and walks on the treadmill occasionally.  He had an episode of bradycardia after getting IV protocol for an endoscopy procedure. He was seen by Lawson Fiscal. His metoprolol dose was decreased.  He has also had a bronchoscopy with Dr. Sherene Sires. He does not have any evidence of COPD.  Both he and his wife have stop smoking.  ( Aug 20, 2011)  Feb. 28, 2014:  He continues to have increasing chest pain.  He has not been smoking.  The pains are associated with exertion, last 5-10 minutes, resolve with 1 SL NTG.   He's had a mildly elevated TSH for the past year or so and has had some fatigue.    his TSH level 6.8.      His medical doctor started him on Synthroid. He gradually has titrated up to 0.1 mg a day. Since that time he's had increasing episodes of chest pain.   Current Outpatient Prescriptions on File Prior to Visit  Medication Sig Dispense Refill  . ACIPHEX 20 MG tablet TAKE 1 TABLET DAILY  30 tablet  6  . aspirin 81 MG tablet Take 81 mg by mouth daily.        . famotidine (PEPCID) 20 MG tablet One at bedtime  30 tablet  11  . hydrochlorothiazide (HYDRODIURIL) 25 MG tablet TAKE 1 TABLET DAILY.  30 tablet  6  . LIPITOR 80 MG tablet TAKE 1 TABLET DAILY  90 tablet  0  . metoprolol (LOPRESSOR) 50 MG tablet Take 50 mg by mouth 2 (two) times daily.      Marland Kitchen NITROSTAT 0.4 MG SL tablet TAKE 1 TABLET UNDER TONGUE EVERY 5 MINUTES UP TO 3 TIMES AS NEEDED FOR CHEST PAIN  25 each  11  . TRILIPIX 135 MG capsule TAKE (1) CAPSULE  DAILY  30 each  6   No current facility-administered medications on file prior to visit.    Allergies  Allergen Reactions  . Lisinopril Cough  . Amlodipine Besylate Other (See Comments)    Headache, weakness, dizziness  . Rosuvastatin Other (See Comments)    REACTION: muscle aches    Past Medical History  Diagnosis Date  . CAD (coronary artery disease)     moderate to severe distal CAD with large epicardial arteries having only mild to moderate irregularities. Last cath in Dec 2006   . Chest pain   . HTN (hypertension)   . HLD (hyperlipidemia)   . GERD (gastroesophageal reflux disease)   . COPD (chronic obstructive pulmonary disease)   . Hiatal hernia   . External hemorrhoids without mention of complication   . Headache   . Nephrolithiasis   . ED (erectile dysfunction)   . Personal history of colonic polyps 03/12/2010    TUBULAR ADENOMA  . Esophageal stricture   . Tobacco abuse   . AV block, Mobitz 1     noted on EKG March 2013    Past Surgical History  Procedure Laterality Date  . Angioplasty      X3  . Video bronchoscopy  08/13/2011    Procedure: VIDEO BRONCHOSCOPY WITHOUT FLUORO;  Surgeon: Nyoka Cowden, MD;  Location: [facility];  Service: Cardiopulmonary;  Laterality: Bilateral;    History  Smoking status  . Former Smoker -- 0.50 packs/day for 40 years  . Types: Cigarettes  . Quit date: 08/25/2011  Smokeless tobacco  . Never Used    History  Alcohol Use  . 0.0 oz/week    Comment: rare    Family History  Problem Relation Age of Onset  . Heart disease Father     hx of MI  . Diabetes Mother     and brother   . Colon cancer Neg Hx   . Stomach cancer Neg Hx   . Breast cancer Sister     Reviw of Systems:  Reviewed in the HPI.  All other systems are negative.  Physical Exam: Blood pressure 110/80, pulse 71, height 5\' 11"  (1.803 m), weight 198 lb (89.812 kg). General: Well developed, well nourished, in no acute distress.  Head:  Normocephalic, atraumatic, sclera non-icteric, mucus membranes are moist,   Neck: Supple. Carotids are 2 + without bruits. No JVD  Lungs: Clear bilaterally to auscultation.  Heart: regular rate.  normal  S1 S2. No murmurs, gallops or rubs.  Abdomen: Soft, non-tender, non-distended with normal bowel sounds. No hepatomegaly. No rebound/guarding. No masses.  Msk:  Strength and tone are normal  Extremities: No clubbing or cyanosis. No edema.  Distal pedal pulses are 2+ and equal bilaterally.  Neuro: Alert and oriented X 3. Moves all extremities spontaneously.  Psych:  Responds to questions appropriately with a normal affect.  ECG: Feb. 28,  2014:  NSR with 1st degree AV block,  Poor R wave progression. Assessment / Plan:

## 2012-06-28 NOTE — Progress Notes (Signed)
Negative Allen's test performed on right hand. 

## 2012-06-28 NOTE — Interval H&P Note (Signed)
History and Physical Interval Note:  06/28/2012 9:20 AM  Daniel Carey  has presented today for surgery, with the diagnosis of cp  The various methods of treatment have been discussed with the patient and family. After consideration of risks, benefits and other options for treatment, the patient has consented to  Procedure(s): JV LEFT HEART CATHETERIZATION WITH CORONARY ANGIOGRAM (N/A) as a surgical intervention .  The patient's history has been reviewed, patient examined, no change in status, stable for surgery.  I have reviewed the patient's chart and labs.  Questions were answered to the patient's satisfaction.     Elyn Aquas.

## 2012-06-28 NOTE — CV Procedure (Signed)
    Cardiac Cath Note  Daniel Carey [id number] 04/02/1951  Procedure: Left  Heart Cardiac Catheterization Note Indications: Chest pain  Procedure Details Consent: Obtained Time Out: Verified patient identification, verified procedure, site/side was marked, verified correct patient position, special equipment/implants available, Radiology Safety Procedures followed,  medications/allergies/relevent history reviewed, required imaging and test results available.  Performed   Medications: Fentanyl: 50 mcg IV Versed: 2 mg IV  The right femoral artery was easily canulated using a modified Seldinger technique.  Hemodynamics:    LV pressure: 104/16 Aortic pressure: 97/54  Angiography   Left Main: no significant irregularities  Left anterior Descending: mild - moderate irregularites,  diag - small  Left Circumflex: moderate irregularities 40 %   Right Coronary Artery: small, the mid RCA has sequential 90 then 95% stenosis  LV Gram: mild hypokinesis of the inferior apical wall   Complications: No apparent complications Patient did tolerate procedure well.  Contrast used: 75 cc  Conclusions:   !. Diffuse CAD,   The mid RCA is tighter than his previous cath in 2006.  I think this explains his symptoms.  We may be able to open this with a cutting balloon.    He has diffuse disease - not a great candidate for CABG.  Intolerant to statins.  We will have him scheduled for PCI.  We may have to put him on for another day given the busy schedule of the main cath lab.  Vesta Mixer, Montez Hageman., MD, [facility] 06/28/2012, 9:46 AM Office - [phone] Pager [phone]

## 2012-06-28 NOTE — Progress Notes (Signed)
Bedrest begins @ 1045, tegaderm dressing applied to right groin site by Army Melia, site level 0.

## 2012-06-30 ENCOUNTER — Encounter (HOSPITAL_COMMUNITY)
Admission: RE | Disposition: A | Discharge status: Home / Self Care | Payer: Self-pay | Source: Ambulatory Visit | Attending: Cardiovascular Disease

## 2012-06-30 ENCOUNTER — Ambulatory Visit (HOSPITAL_COMMUNITY)
Admission: RE | Admit: 2012-06-30 | Discharge: 2012-07-01 | Disposition: A | Discharge status: Home / Self Care | Payer: BC Managed Care – PPO | Source: Ambulatory Visit | Attending: Cardiovascular Disease | Admitting: Cardiovascular Disease

## 2012-06-30 DIAGNOSIS — K219 Gastro-esophageal reflux disease without esophagitis: Secondary | ICD-10-CM | POA: Diagnosis present

## 2012-06-30 DIAGNOSIS — E785 Hyperlipidemia, unspecified: Secondary | ICD-10-CM | POA: Insufficient documentation

## 2012-06-30 DIAGNOSIS — I2510 Atherosclerotic heart disease of native coronary artery without angina pectoris: Secondary | ICD-10-CM

## 2012-06-30 DIAGNOSIS — I2584 Coronary atherosclerosis due to calcified coronary lesion: Secondary | ICD-10-CM | POA: Insufficient documentation

## 2012-06-30 DIAGNOSIS — F17200 Nicotine dependence, unspecified, uncomplicated: Secondary | ICD-10-CM | POA: Diagnosis present

## 2012-06-30 DIAGNOSIS — I10 Essential (primary) hypertension: Secondary | ICD-10-CM | POA: Diagnosis present

## 2012-06-30 DIAGNOSIS — J449 Chronic obstructive pulmonary disease, unspecified: Secondary | ICD-10-CM | POA: Insufficient documentation

## 2012-06-30 DIAGNOSIS — I200 Unstable angina: Secondary | ICD-10-CM | POA: Diagnosis present

## 2012-06-30 DIAGNOSIS — J4489 Other specified chronic obstructive pulmonary disease: Secondary | ICD-10-CM | POA: Insufficient documentation

## 2012-06-30 HISTORY — PX: PERCUTANEOUS CORONARY ROTOBLATOR INTERVENTION (PCI-R): SHX5484

## 2012-06-30 LAB — BASIC METABOLIC PANEL
BUN: 20 mg/dL (ref 6–23)
CO2: 24 mEq/L (ref 19–32)
Calcium: 9.5 mg/dL (ref 8.4–10.5)
Chloride: 102 mEq/L (ref 96–112)
Creatinine, Ser: 1.16 mg/dL (ref 0.50–1.35)
GFR calc Af Amer: 77 mL/min — ABNORMAL LOW (ref >90)
GFR calc non Af Amer: 67 mL/min — ABNORMAL LOW (ref >90)
Glucose, Bld: 144 mg/dL — ABNORMAL HIGH (ref 70–99)
Potassium: 4.4 mEq/L (ref 3.5–5.1)
Sodium: 139 mEq/L (ref 135–145)

## 2012-06-30 LAB — POCT ACTIVATED CLOTTING TIME: Activated Clotting Time: 415 seconds

## 2012-06-30 LAB — CBC
HCT: 42.3 % (ref 39.0–52.0)
Hemoglobin: 15 g/dL (ref 13.0–17.0)
MCH: 30.6 pg (ref 26.0–34.0)
MCHC: 35.5 g/dL (ref 30.0–36.0)
MCV: 86.3 fL (ref 78.0–100.0)
Platelets: 264 10*3/uL (ref 150–400)
RBC: 4.9 MIL/uL (ref 4.22–5.81)
RDW: 13.2 % (ref 11.5–15.5)
WBC: 7.1 10*3/uL (ref 4.0–10.5)

## 2012-06-30 SURGERY — PERCUTANEOUS CORONARY ROTOBLATOR INTERVENTION (PCI-R)
Anesthesia: LOCAL

## 2012-06-30 MED ORDER — ATORVASTATIN CALCIUM 80 MG PO TABS
80.0000 mg | ORAL_TABLET | Freq: Every day | ORAL | Status: DC
  Administered 2012-06-30: 19:00:00 80 mg via ORAL
  Filled 2012-06-30 (×2): qty 1

## 2012-06-30 MED ORDER — SODIUM CHLORIDE 0.9 % IV SOLN
INTRAVENOUS | Status: DC
  Administered 2012-06-30: 07:00:00 via INTRAVENOUS

## 2012-06-30 MED ORDER — NITROGLYCERIN IN D5W 200-5 MCG/ML-% IV SOLN
INTRAVENOUS | Status: AC
  Filled 2012-06-30: qty 250

## 2012-06-30 MED ORDER — VERAPAMIL HCL 2.5 MG/ML IV SOLN
INTRAVENOUS | Status: AC
  Filled 2012-06-30: qty 4

## 2012-06-30 MED ORDER — HYDROCHLOROTHIAZIDE 25 MG PO TABS
25.0000 mg | ORAL_TABLET | Freq: Every day | ORAL | Status: DC
  Administered 2012-07-01: 25 mg via ORAL
  Filled 2012-06-30: qty 1

## 2012-06-30 MED ORDER — HEPARIN (PORCINE) IN NACL 2-0.9 UNIT/ML-% IJ SOLN
INTRAMUSCULAR | Status: AC
  Filled 2012-06-30: qty 1500

## 2012-06-30 MED ORDER — METOPROLOL TARTRATE 50 MG PO TABS
50.0000 mg | ORAL_TABLET | Freq: Every day | ORAL | Status: DC
  Administered 2012-07-01: 50 mg via ORAL
  Filled 2012-06-30 (×2): qty 1

## 2012-06-30 MED ORDER — NITROGLYCERIN 0.4 MG SL SUBL: 0.4000 mg | SUBLINGUAL_TABLET | SUBLINGUAL | Status: DC | PRN

## 2012-06-30 MED ORDER — SODIUM CHLORIDE 0.9 % IV SOLN: INTRAVENOUS | Status: AC

## 2012-06-30 MED ORDER — SODIUM CHLORIDE 0.9 % IJ SOLN: 3.0000 mL | Freq: Two times a day (BID) | INTRAMUSCULAR | Status: DC

## 2012-06-30 MED ORDER — SODIUM CHLORIDE 0.9 % IJ SOLN: 3.0000 mL | INTRAMUSCULAR | Status: DC | PRN

## 2012-06-30 MED ORDER — ACETAMINOPHEN 325 MG PO TABS: 650.0000 mg | ORAL_TABLET | ORAL | Status: DC | PRN

## 2012-06-30 MED ORDER — DOPAMINE-DEXTROSE 3.2-5 MG/ML-% IV SOLN
INTRAVENOUS | Status: AC
  Filled 2012-06-30: qty 250

## 2012-06-30 MED ORDER — FENOFIBRATE 54 MG PO TABS
54.0000 mg | ORAL_TABLET | Freq: Every day | ORAL | Status: DC
  Administered 2012-06-30 – 2012-07-01 (×2): 54 mg via ORAL
  Filled 2012-06-30 (×2): qty 1

## 2012-06-30 MED ORDER — ASPIRIN 81 MG PO CHEW
324.0000 mg | CHEWABLE_TABLET | ORAL | Status: AC
  Administered 2012-06-30: 324 mg via ORAL

## 2012-06-30 MED ORDER — MIDAZOLAM HCL 2 MG/2ML IJ SOLN
INTRAMUSCULAR | Status: AC
  Filled 2012-06-30: qty 2

## 2012-06-30 MED ORDER — MORPHINE SULFATE 2 MG/ML IJ SOLN
INTRAMUSCULAR | Status: AC
  Filled 2012-06-30: qty 1

## 2012-06-30 MED ORDER — FENTANYL CITRATE 0.05 MG/ML IJ SOLN
INTRAMUSCULAR | Status: AC
  Filled 2012-06-30: qty 2

## 2012-06-30 MED ORDER — ONDANSETRON HCL 4 MG/2ML IJ SOLN: 4.0000 mg | Freq: Four times a day (QID) | INTRAMUSCULAR | Status: DC | PRN

## 2012-06-30 MED ORDER — LEVOTHYROXINE SODIUM 50 MCG PO TABS
50.0000 ug | ORAL_TABLET | Freq: Every day | ORAL | Status: DC
  Administered 2012-07-01: 09:00:00 50 ug via ORAL
  Filled 2012-06-30 (×2): qty 1

## 2012-06-30 MED ORDER — LIDOCAINE HCL (PF) 1 % IJ SOLN
INTRAMUSCULAR | Status: AC
  Filled 2012-06-30: qty 30

## 2012-06-30 MED ORDER — BIVALIRUDIN 250 MG IV SOLR
INTRAVENOUS | Status: AC
  Filled 2012-06-30: qty 250

## 2012-06-30 MED ORDER — ASPIRIN EC 81 MG PO TBEC
81.0000 mg | DELAYED_RELEASE_TABLET | Freq: Every day | ORAL | Status: DC
  Administered 2012-07-01: 10:00:00 81 mg via ORAL
  Filled 2012-06-30: qty 1

## 2012-06-30 MED ORDER — FAMOTIDINE 20 MG PO TABS
20.0000 mg | ORAL_TABLET | Freq: Every day | ORAL | Status: DC | PRN
  Filled 2012-06-30: qty 1

## 2012-06-30 MED ORDER — ASPIRIN 81 MG PO CHEW
CHEWABLE_TABLET | ORAL | Status: AC
  Filled 2012-06-30: qty 4

## 2012-06-30 MED ORDER — SODIUM CHLORIDE 0.9 % IV SOLN: 250.0000 mL | INTRAVENOUS | Status: DC | PRN

## 2012-06-30 MED ORDER — MORPHINE SULFATE 4 MG/ML IJ SOLN
2.0000 mg | INTRAMUSCULAR | Status: DC | PRN
  Administered 2012-06-30: 2 mg via INTRAVENOUS

## 2012-06-30 MED ORDER — SODIUM CHLORIDE 0.9 % IV SOLN
0.2500 mg/kg/h | INTRAVENOUS | Status: DC
  Filled 2012-06-30: qty 250

## 2012-06-30 MED ORDER — CLOPIDOGREL BISULFATE 75 MG PO TABS
75.0000 mg | ORAL_TABLET | Freq: Every day | ORAL | Status: DC
  Administered 2012-07-01: 75 mg via ORAL
  Filled 2012-06-30: qty 1

## 2012-06-30 NOTE — Progress Notes (Signed)
Utilization Review Completed.   Patsie Mccardle, RN, BSN Nurse Case Manager  [phone]  

## 2012-06-30 NOTE — Interval H&P Note (Signed)
History and Physical Interval Note:  06/30/2012 8:53 AM  Daniel Carey  has presented today for surgery, with the diagnosis of blockage  The various methods of treatment have been discussed with the patient and family. After consideration of risks, benefits and other options for treatment, the patient has consented to  Procedure(s): PERCUTANEOUS CORONARY ROTOBLATOR INTERVENTION (PCI-R) (N/A) as a surgical intervention .  The patient's history has been reviewed, patient examined, no change in status, stable for surgery.  I have reviewed the patient's chart and labs.  Questions were answered to the patient's satisfaction.     [name]

## 2012-06-30 NOTE — Progress Notes (Signed)
Pt. had  2nd  Degree HB(type1) in the monitor, notified from central monitoring, HR  Has been  39-40's range  and back to the 70's and 80's again. Pt. Asymptomatic, BP stable, Daniel Carey was notified with order to monitor the pt.

## 2012-06-30 NOTE — H&P (View-Only) (Signed)
  Daniel Carey Date of Birth  07/03/1950       [place] Office    [place] [facility] [address]  [address] [place], [place]  [address]   [place], [place]  [address] [phone]     [phone]   Fax  [phone]    Fax [phone]  Problem List: 1. CAD 2. Bradycardia after getting propafol for endoscopy. 3. Hypothyroidism   History of Present Illness:  Daniel Carey is feeling well today.  He's been getting some exercise. He does yard work and walks on the treadmill occasionally.  He had an episode of bradycardia after getting IV protocol for an endoscopy procedure. He was seen by Daniel Carey. His metoprolol dose was decreased.  He has also had a bronchoscopy with Dr. Wert. He does not have any evidence of COPD.  Both he and his wife have stop smoking.  ( Aug 20, 2011)  Feb. 28, 2014:  He continues to have increasing chest pain.  He has not been smoking.  The pains are associated with exertion, last 5-10 minutes, resolve with 1 SL NTG.   He's had a mildly elevated TSH for the past year or so and has had some fatigue.    his TSH level 6.8.      His medical doctor started him on Synthroid. He gradually has titrated up to 0.1 mg a day. Since that time he's had increasing episodes of chest pain.   Current Outpatient Prescriptions on File Prior to Visit  Medication Sig Dispense Refill  . ACIPHEX 20 MG tablet TAKE 1 TABLET DAILY  30 tablet  6  . aspirin 81 MG tablet Take 81 mg by mouth daily.        . famotidine (PEPCID) 20 MG tablet One at bedtime  30 tablet  11  . hydrochlorothiazide (HYDRODIURIL) 25 MG tablet TAKE 1 TABLET DAILY.  30 tablet  6  . LIPITOR 80 MG tablet TAKE 1 TABLET DAILY  90 tablet  0  . metoprolol (LOPRESSOR) 50 MG tablet Take 50 mg by mouth 2 (two) times daily.      . NITROSTAT 0.4 MG SL tablet TAKE 1 TABLET UNDER TONGUE EVERY 5 MINUTES UP TO 3 TIMES AS NEEDED FOR CHEST PAIN  25 each  11  . TRILIPIX 135 MG capsule TAKE (1) CAPSULE  DAILY  30 each  6   No current facility-administered medications on file prior to visit.    Allergies  Allergen Reactions  . Lisinopril Cough  . Amlodipine Besylate Other (See Comments)    Headache, weakness, dizziness  . Rosuvastatin Other (See Comments)    REACTION: muscle aches    Past Medical History  Diagnosis Date  . CAD (coronary artery disease)     moderate to severe distal CAD with large epicardial arteries having only mild to moderate irregularities. Last cath in Dec 2006   . Chest pain   . HTN (hypertension)   . HLD (hyperlipidemia)   . GERD (gastroesophageal reflux disease)   . COPD (chronic obstructive pulmonary disease)   . Hiatal hernia   . External hemorrhoids without mention of complication   . Headache   . Nephrolithiasis   . ED (erectile dysfunction)   . Personal history of colonic polyps 03/12/2010    TUBULAR ADENOMA  . Esophageal stricture   . Tobacco abuse   . AV block, Mobitz 1     noted on EKG March 2013    Past Surgical History    Procedure Laterality Date  . Angioplasty      X3  . Video bronchoscopy  08/13/2011    Procedure: VIDEO BRONCHOSCOPY WITHOUT FLUORO;  Surgeon: Daniel B Wert, MD;  Location: [facility];  Service: Cardiopulmonary;  Laterality: Bilateral;    History  Smoking status  . Former Smoker -- 0.50 packs/day for 40 years  . Types: Cigarettes  . Quit date: 08/25/2011  Smokeless tobacco  . Never Used    History  Alcohol Use  . 0.0 oz/week    Comment: rare    Family History  Problem Relation Age of Onset  . Heart disease Father     hx of MI  . Diabetes Mother     and brother   . Colon cancer Neg Hx   . Stomach cancer Neg Hx   . Breast cancer Sister     Reviw of Systems:  Reviewed in the HPI.  All other systems are negative.  Physical Exam: Blood pressure 110/80, pulse 71, height 5' 11" (1.803 m), weight 198 lb (89.812 kg). General: Well developed, well nourished, in no acute distress.  Head:  Normocephalic, atraumatic, sclera non-icteric, mucus membranes are moist,   Neck: Supple. Carotids are 2 + without bruits. No JVD  Lungs: Clear bilaterally to auscultation.  Heart: regular rate.  normal  S1 S2. No murmurs, gallops or rubs.  Abdomen: Soft, non-tender, non-distended with normal bowel sounds. No hepatomegaly. No rebound/guarding. No masses.  Msk:  Strength and tone are normal  Extremities: No clubbing or cyanosis. No edema.  Distal pedal pulses are 2+ and equal bilaterally.  Neuro: Alert and oriented X 3. Moves all extremities spontaneously.  Psych:  Responds to questions appropriately with a normal affect.  ECG: Feb. 28,  2014:  NSR with 1st degree AV block,  Poor R wave progression. Assessment / Plan:   

## 2012-06-30 NOTE — CV Procedure (Signed)
   Cardiac Catheterization Operative Report  Daniel Carey [id number] 3/13/201411:50 AM Sanda Linger, MD  Procedure Performed:  1. Atherectomy RCA 2. PTCA/DES mid RCA 3. Temporary transvenous pacemaker 4. Angioseal right femoral artery   Operator: Verne Carrow, MD  Indication: 62 yo male with unstable (class 3) angina. Diagnostic cath this week per Dr. Elease Hashimoto and found to have moderate LAD and Circumflex disease with severe disease in the moderate caliber, diffusely calcified RCA.                                        Procedure Details: The risks, benefits, complications, treatment options, and expected outcomes were discussed with the patient. The patient and/or family concurred with the proposed plan, giving informed consent. The patient was brought to the cath lab after IV hydration was begun and oral premedication was given. The patient was further sedated with Versed and Fentanyl. The right groin was prepped and draped in the usual manner. Using the modified Seldinger access technique, a 6 French sheath was placed in the right femoral artery. A 6 French sheath was placed in the right femoral vein. A temporary transvenous pacemaker was advanced into the RV. I then tried several guides given the vertical takeoff of the RCA. I ultimately engaged with a Williams Right guide. I then advanced a Rota Floppy IC wire down the RCA. 3 runs were made with a 1.25 mm Rotablator burr. I then used a 1.5 mm burr and made two runs. I then pre-dilated the mid and distal lesions with a 2.0 x 20 mm balloon. The Rota floppy wire was changed out through an OTW balloon for a long BMW wire. I then deployed a 2.5 x 38 mm Promus Premier DES in the distal RCA and a 2.75 x 38 mm Promus Premier DES in the mid vessel in an overlapping fashion. The distal sten was post-dilated with a 2.5 x 20 mm Warren balloon. The proximal portion of the stent was post-dilated with a 3.0 x 20 mm [place] balloon. I then used a 3.25 x 8 mm  Garibaldi balloon to post-dilate the proximal portion of the stent. There was an excellent angiographic result. The stenoses were taken from 99% down to 0%. Dopamine was used during the case for pressure support. The pacemaker was required for much of the case. Angioseal RFA.   There were no immediate complications. The patient was taken to the recovery area in stable condition.   Hemodynamic Findings: Central aortic pressure: 127/68  Impression: 1. Severe stenosis RCA, unstable angina.  2. Successful PTCA/DES x 2 after atherectomy mid and distal RCA  Recommendations: ASA and Plavix for at least one year.        Complications:  None; patient tolerated the procedure well.

## 2012-07-01 ENCOUNTER — Encounter (HOSPITAL_COMMUNITY): Payer: Self-pay

## 2012-07-01 DIAGNOSIS — I200 Unstable angina: Secondary | ICD-10-CM | POA: Diagnosis present

## 2012-07-01 DIAGNOSIS — I2510 Atherosclerotic heart disease of native coronary artery without angina pectoris: Secondary | ICD-10-CM

## 2012-07-01 LAB — BASIC METABOLIC PANEL
BUN: 15 mg/dL (ref 6–23)
CO2: 25 mEq/L (ref 19–32)
Calcium: 8.9 mg/dL (ref 8.4–10.5)
Chloride: 104 mEq/L (ref 96–112)
Creatinine, Ser: 1.06 mg/dL (ref 0.50–1.35)
GFR calc Af Amer: 86 mL/min — ABNORMAL LOW (ref >90)
GFR calc non Af Amer: 74 mL/min — ABNORMAL LOW (ref >90)
Glucose, Bld: 113 mg/dL — ABNORMAL HIGH (ref 70–99)
Potassium: 4.1 mEq/L (ref 3.5–5.1)
Sodium: 139 mEq/L (ref 135–145)

## 2012-07-01 LAB — CBC
HCT: 34.6 % — ABNORMAL LOW (ref 39.0–52.0)
Hemoglobin: 12.2 g/dL — ABNORMAL LOW (ref 13.0–17.0)
MCH: 30.7 pg (ref 26.0–34.0)
MCHC: 35.3 g/dL (ref 30.0–36.0)
MCV: 86.9 fL (ref 78.0–100.0)
Platelets: 221 10*3/uL (ref 150–400)
RBC: 3.98 MIL/uL — ABNORMAL LOW (ref 4.22–5.81)
RDW: 13.2 % (ref 11.5–15.5)
WBC: 7.5 10*3/uL (ref 4.0–10.5)

## 2012-07-01 MED ORDER — NITROGLYCERIN 0.4 MG SL SUBL: 0.4000 mg | SUBLINGUAL_TABLET | SUBLINGUAL | Status: DC | PRN

## 2012-07-01 MED FILL — Dextrose Inj 5%: INTRAVENOUS | Qty: 50 | Status: AC

## 2012-07-01 NOTE — Discharge Summary (Signed)
CARDIOLOGY DISCHARGE SUMMARY   Patient ID: Daniel Carey MRN: [id number] DOB/AGE: 08/28/1950 62 y.o.  Admit date: 06/30/2012 Discharge date: 07/01/2012  Primary Discharge Diagnosis:  Crescendo angina - s/p 2.5 x 38 mm Promus Premier DES in the distal RCA and a 2.75 x 38 mm Promus Premier DES in the mid vessel in an overlapping fashion   Secondary Discharge Diagnosis:    TOBACCO USE   HYPERTENSION   GERD  Procedures: JV Lab: Left Heart Cardiac Catheterization  Main lab: Procedure Performed:  1. Atherectomy RCA  2. PTCA/DES x 2  mid RCA  3. Temporary transvenous pacemaker  4. Angioseal right femoral artery   Hospital Course: Daniel Carey is a 62 y.o. male with a history of CAD. He saw Dr Elease Hashimoto in the office describing progressive angina. His symptoms were very concerning and he was advised to limit his activities and scheduled for a cardiac catheterization on 06/28/2012.   The procedure results are below. His films were reviewed and Dr Elease Hashimoto felt PCI of the RCA was indicated so he was admitted to the hospital and scheduled for PCI.  He remained stable on medical therapy and bed rest. He was taken to the lab on 3/13 for PCI to the RCA. The procedure results are listed below. A temporary transvenous pacer was inserted and removed after the procedure. He maintained SR and was continued on his metoprolol.   On 07/01/2012, Daniel Carey was seen by Dr Riley Kill and by cardiac rehab. His BP was under good control. He was offered Protonix for his reflux and understands he is not to take omeprazole but prefers to stay on Aciphex. He is on a beta blocker, a statin and has a long term prescription for Plavix. He was ambulating without chest pain or SOB and considered stable for discharge, to follow up as an outpatient.   Labs:  Lab Results  Component Value Date   WBC 7.5 07/01/2012   HGB 12.2* 07/01/2012   HCT 34.6* 07/01/2012   MCV 86.9 07/01/2012   PLT 221 07/01/2012     Recent Labs Lab  07/01/12 0512  NA 139  K 4.1  CL 104  CO2 25  BUN 15  CREATININE 1.06  CALCIUM 8.9  GLUCOSE 113*   Cardiac Cath: 06/28/2012 JV Lab Left Main: no significant irregularities  Left anterior Descending: mild - moderate irregularites, diag - small  Left Circumflex: moderate irregularities 40 %  Right Coronary Artery: small, the mid RCA has sequential 90 then 95% stenosis  LV Gram: mild hypokinesis of the inferior apical wall  07/01/2011 Main cath lab Procedure Performed:  1. Atherectomy RCA  2. PTCA/DES mid RCA with 2.5 x 38 mm Promus Premier DES in the distal RCA and a 2.75 x 38 mm Promus Premier DES in the mid vessel in an overlapping fashion 3. Temporary transvenous pacemaker  4. Angioseal right femoral artery   EKG:21-Jun-2012 05:21:29 Sinus rhythm with 1st degree A-V block Otherwise normal ECG Vent. rate 64 BPM PR interval 216 ms QRS duration 90 ms QT/QTc 408/420 ms P-R-T axes 58 54 53    FOLLOW UP PLANS AND APPOINTMENTS Allergies  Allergen Reactions  . Lisinopril Cough  . Amlodipine Besylate Other (See Comments)    Headache, weakness, dizziness  . Propofol Other (See Comments)    Heart rate dropped  . Rosuvastatin Other (See Comments)     muscle aches     Medication List    TAKE these medications  aspirin EC 81 MG tablet  Take 81 mg by mouth daily.     atorvastatin 80 MG tablet  Commonly known as:  LIPITOR  Take 80 mg by mouth daily.     Choline Fenofibrate 135 MG capsule  Take 135 mg by mouth daily.     clopidogrel 75 MG tablet  Commonly known as:  PLAVIX  Take 75 mg by mouth daily.     famotidine 20 MG tablet  Commonly known as:  PEPCID  Take 20 mg by mouth daily as needed for heartburn.     hydrochlorothiazide 25 MG tablet  Commonly known as:  HYDRODIURIL  Take 12.5 mg by mouth daily.     levothyroxine 100 MCG tablet  Commonly known as:  SYNTHROID, LEVOTHROID  Take 25 mcg by mouth daily.     metoprolol 50 MG tablet  Commonly known  as:  LOPRESSOR  Take 25 mg by mouth 2 (two) times daily.     naproxen sodium 220 MG tablet  Commonly known as:  ANAPROX  Take 220 mg by mouth daily as needed (headache).     nitroGLYCERIN 0.4 MG SL tablet  Commonly known as:  NITROSTAT  Place 1 tablet (0.4 mg total) under the tongue every 5 (five) minutes as needed for chest pain.     RABEprazole 20 MG tablet  Commonly known as:  ACIPHEX  Take 20 mg by mouth daily.        Discharge Orders   Future Appointments Provider Department Dept Phone   07/06/2012 2:30 PM Rosalio Macadamia, NP [facility] Main Office [place]) [phone]   Future Orders Complete By Expires     Diet - low sodium heart healthy  As directed     Increase activity slowly  As directed       Follow-up Information   Follow up with Norma Fredrickson, NP On 07/06/2012. (at 2:30 pm)    Contact information:   [address] [place] [address] [phone]       BRING ALL MEDICATIONS WITH YOU TO FOLLOW UP APPOINTMENTS  Time spent with patient to include physician time: 33 min Signed: Theodore Demark, PA-C 07/01/2012, 10:11 AM Co-Sign MD  Patient is ready for discharge.  Dr. Clifton James performed the procedure.  The patient was dismissed with scheduled plans for follow up.

## 2012-07-01 NOTE — Progress Notes (Signed)
CARDIAC REHAB PHASE I   PRE:  Rate/Rhythm: 83SR  BP:  Supine:   Sitting:   Standing: 173/84   SaO2:   MODE:  Ambulation: 800 ft   POST:  Rate/Rhythm: 90SR  BP:  Supine:   Sitting: right arm 162/98 dinamapp, left arm 182/94  Standing:    SaO2:                                 Manual BP right arm 130/78,  Left arm 150/84 [id number] Pt walked 800 ft with steady gait. Tolerated well. Denied CP. Pt concerned about BP.  Pt's BP better when I took it manually. Pt states it is time to take his BP meds. Notified pt's RN. Education completed with pt and wife. Wife has concern about plavix with pt's stomach issues. Discussed seriousness of stent occlusion if plavix and aspirin not taken. Told pt to notify MD if dark stools or stomach problems and to discuss with him this am if concerned. Pt voiced that he knows he needs to take it or he could have a heart attack if stent closes. Discussed CRP2. Pt has done before but work schedule and traveling does not allow now. Left brochure in case he changes his mind.   Luetta Nutting, RN BSN  07/01/2012 8:33 AM

## 2012-07-01 NOTE — Progress Notes (Signed)
Patient Name: Daniel Carey Date of Encounter: 07/01/2012     Active Problems:   * No active hospital problems. *    SUBJECTIVE  Doing well.  Wants to go home.  No chest pain.   CURRENT MEDS . aspirin EC  81 mg Oral Daily  . atorvastatin  80 mg Oral q1800  . clopidogrel  75 mg Oral Q breakfast  . fenofibrate  54 mg Oral Daily  . hydrochlorothiazide  25 mg Oral Daily  . levothyroxine  50 mcg Oral Q breakfast  . metoprolol  50 mg Oral Daily    OBJECTIVE  Filed Vitals:   06/30/12 0704 07/01/12 0030 07/01/12 0515 07/01/12 0719  BP:  144/81 145/79 165/88  Pulse:  65 61 68  Temp:  97.9 F (36.6 C) 97.7 F (36.5 C) 98 F (36.7 C)  TempSrc:  Oral Oral Oral  Resp:  18 18 18   Height: 5\' 10"  (1.778 m)     Weight: 197 lb (89.359 kg) 206 lb 12.7 oz (93.8 kg)    SpO2: 98%   96%    Intake/Output Summary (Last 24 hours) at 07/01/12 0907 Last data filed at 07/01/12 0900  Gross per 24 hour  Intake   1020 ml  Output    400 ml  Net    620 ml   Filed Weights   06/30/12 0704 07/01/12 0030  Weight: 197 lb (89.359 kg) 206 lb 12.7 oz (93.8 kg)    PHYSICAL EXAM  General: Pleasant, NAD. Neuro: Alert and oriented X 3. Moves all extremities spontaneously. Psych: Normal affect. HEENT:  Normal  Neck: Supple without bruits or JVD. Lungs:  Resp regular and unlabored, CTA. Heart: RRR no s3, s4, or murmurs. Groin:  Ok.  No hematoma. Extremities: No clubbing, cyanosis or edema. DP/PT/Radials 2+ and equal bilaterally.  Accessory Clinical Findings  CBC  Recent Labs  06/30/12 0704 07/01/12 0512  WBC 7.1 7.5  HGB 15.0 12.2*  HCT 42.3 34.6*  MCV 86.3 86.9  PLT 264 221   Basic Metabolic Panel  Recent Labs  06/30/12 0704 07/01/12 0512  NA 139 139  K 4.4 4.1  CL 102 104  CO2 24 25  GLUCOSE 144* 113*  BUN 20 15  CREATININE 1.16 1.06  CALCIUM 9.5 8.9   Liver Function Tests No results found for this basename: AST, ALT, ALKPHOS, BILITOT, PROT, ALBUMIN,  in the last  72 hours No results found for this basename: LIPASE, AMYLASE,  in the last 72 hours Cardiac Enzymes No results found for this basename: CKTOTAL, CKMB, CKMBINDEX, TROPONINI,  in the last 72 hours BNP No components found with this basename: POCBNP,  D-Dimer No results found for this basename: DDIMER,  in the last 72 hours Hemoglobin A1C No results found for this basename: HGBA1C,  in the last 72 hours Fasting Lipid Panel No results found for this basename: CHOL, HDL, LDLCALC, TRIG, CHOLHDL, LDLDIRECT,  in the last 72 hours Thyroid Function Tests No results found for this basename: TSH, T4TOTAL, FREET3, T3FREE, THYROIDAB,  in the last 72 hours  TELE  No arryhtmia  ECG  NSR.  No acute changes.    Radiology/Studies  No results found.  ASSESSMENT AND PLAN  1.  SP PCI with rotational atherectomy   Plan  1.  Need for DAPT stressed. 2.  Would add Protonix given GI issues.  3.  Early follow up in OP clinic with Dr. Elease Hashimoto 4.  No flying until seen  Exercise instructions  reviewed.  Signed, Shawnie Pons MD, [facility], FSCAI

## 2012-07-01 NOTE — Progress Notes (Signed)
Patient states he had quit smoking Aug 20, 2011 at 930 and was no longer a smoker. History was checked and reflected that he had quit on 08/20/2011. Our system allows changing to history of tobacco use after 1 year has past tobacco free. Information given to patient/wife.

## 2012-07-04 ENCOUNTER — Telehealth: Payer: Self-pay | Admitting: *Deleted

## 2012-07-04 NOTE — Telephone Encounter (Signed)
TCM patient. Called to check status. States that he is feeling well without complaints. Taking all medications as ordered. Aware of appointment on 3/19 with Dawayne Patricia NP.

## 2012-07-06 ENCOUNTER — Ambulatory Visit (INDEPENDENT_AMBULATORY_CARE_PROVIDER_SITE_OTHER): Payer: BC Managed Care – PPO | Admitting: Nurse Practitioner

## 2012-07-06 ENCOUNTER — Encounter: Payer: Self-pay | Admitting: Nurse Practitioner

## 2012-07-06 VITALS — BP 124/85 | HR 68 | Ht 70.5 in | Wt 195.0 lb

## 2012-07-06 DIAGNOSIS — I2510 Atherosclerotic heart disease of native coronary artery without angina pectoris: Secondary | ICD-10-CM

## 2012-07-06 NOTE — Patient Instructions (Addendum)
I think you are doing well  You may return to work  Dr. Elease Hashimoto will see you in 6 to 8 weeks  Walking every day - with a goal of walking up to 45 minutes a day  Call the [facility] office at [phone] if you have any questions, problems or concerns.

## 2012-07-06 NOTE — Progress Notes (Signed)
Daniel Carey Date of Birth: [birth year] Medical Record #[id number]  History of Present Illness: Daniel Carey is seen back today for a post PCI visit. He is seen for Dr. Elease Hashimoto. He has had recent cath due to progressive angina. Underwent DES x 2 to the RCA. Other issues include tobacco use, HTN, HLD and GERD.   He comes in today. He is here alone. He is doing better. No more chest pain. Tolerating his medicines. No problems with his groin. Feels good.   Current Outpatient Prescriptions on File Prior to Visit  Medication Sig Dispense Refill  . aspirin EC 81 MG tablet Take 81 mg by mouth daily.      Marland Kitchen atorvastatin (LIPITOR) 80 MG tablet Take 80 mg by mouth daily.      . Choline Fenofibrate 135 MG capsule Take 135 mg by mouth daily.      . clopidogrel (PLAVIX) 75 MG tablet Take 75 mg by mouth daily.      . famotidine (PEPCID) 20 MG tablet Take 20 mg by mouth daily as needed for heartburn.      . hydrochlorothiazide (HYDRODIURIL) 25 MG tablet Take 12.5 mg by mouth daily.      Marland Kitchen levothyroxine (SYNTHROID, LEVOTHROID) 100 MCG tablet Take 25 mcg by mouth daily.      . metoprolol (LOPRESSOR) 50 MG tablet Take 25 mg by mouth 2 (two) times daily.      . naproxen sodium (ANAPROX) 220 MG tablet Take 220 mg by mouth daily as needed (headache).      . nitroGLYCERIN (NITROSTAT) 0.4 MG SL tablet Place 1 tablet (0.4 mg total) under the tongue every 5 (five) minutes as needed for chest pain.  25 tablet  3  . RABEprazole (ACIPHEX) 20 MG tablet Take 20 mg by mouth daily.       No current facility-administered medications on file prior to visit.    Allergies  Allergen Reactions  . Lisinopril Cough  . Amlodipine Besylate Other (See Comments)    Headache, weakness, dizziness  . Propofol Other (See Comments)    Heart rate dropped  . Rosuvastatin Other (See Comments)     muscle aches    Past Medical History  Diagnosis Date  . CAD (coronary artery disease)     moderate to severe distal CAD with large  epicardial arteries having only mild to moderate irregularities. Last cath in Dec 2006; s/p cath with Promus DES in distal RCA and Promus DES in the mid vessel in an overlapping fashion  . Chest pain   . HTN (hypertension)   . HLD (hyperlipidemia)   . GERD (gastroesophageal reflux disease)   . COPD (chronic obstructive pulmonary disease)   . Hiatal hernia   . External hemorrhoids without mention of complication   . Headache   . Nephrolithiasis   . ED (erectile dysfunction)   . Personal history of colonic polyps 03/12/2010    TUBULAR ADENOMA  . Esophageal stricture   . Tobacco abuse   . AV block, Mobitz 1     noted on EKG March 2013    Past Surgical History  Procedure Laterality Date  . Angioplasty      X3  . Video bronchoscopy  08/13/2011    Procedure: VIDEO BRONCHOSCOPY WITHOUT FLUORO;  Surgeon: Nyoka Cowden, MD;  Location: [facility];  Service: Cardiopulmonary;  Laterality: Bilateral;    History  Smoking status  . Former Smoker -- 0.50 packs/day for 40 years  . Types: Cigarettes  .  Quit date: 08/25/2011  Smokeless tobacco  . Never Used    History  Alcohol Use  . 0.0 oz/week    Comment: rare    Family History  Problem Relation Age of Onset  . Heart disease Father     hx of MI  . Diabetes Mother     and brother   . Colon cancer Neg Hx   . Stomach cancer Neg Hx   . Breast cancer Sister     Review of Systems: The review of systems is per the HPI.  All other systems were reviewed and are negative.  Physical Exam: BP 124/85  Pulse 68  Ht 5' 10.5" (1.791 m)  Wt 195 lb (88.451 kg)  BMI 27.57 kg/m2 Patient is very pleasant and in no acute distress. Skin is warm and dry. Color is normal.  HEENT is unremarkable. Normocephalic/atraumatic. PERRL. Sclera are nonicteric. Neck is supple. No masses. No JVD. Lungs are clear. Cardiac exam shows a regular rate and rhythm. Abdomen is soft. Extremities are without edema. Gait and ROM are intact. No gross neurologic  deficits noted.  LABORATORY DATA:  Lab Results  Component Value Date   WBC 7.5 07/01/2012   HGB 12.2* 07/01/2012   HCT 34.6* 07/01/2012   PLT 221 07/01/2012   GLUCOSE 113* 07/01/2012   CHOL 161 06/08/2012   TRIG 126.0 06/08/2012   HDL 42.40 06/08/2012   LDLCALC 93 06/08/2012   ALT 33 06/08/2012   AST 23 06/08/2012   NA 139 07/01/2012   K 4.1 07/01/2012   CL 104 07/01/2012   CREATININE 1.06 07/01/2012   BUN 15 07/01/2012   CO2 25 07/01/2012   TSH 1.56 06/08/2012   PSA 0.40 12/05/2010   INR 1.1* 06/21/2012   HGBA1C 6.7* 07/20/2011    Coronary Angiography  Left Main: no significant irregularities  Left anterior Descending: mild - moderate irregularites, diag - small  Left Circumflex: moderate irregularities 40 %  Right Coronary Artery: small, the mid RCA has sequential 90 then 95% stenosis  LV Gram: mild hypokinesis of the inferior apical wall  Complications: No apparent complications  Patient did tolerate procedure well.  Contrast used: 75 cc   Conclusions:  !. Diffuse CAD, The mid RCA is tighter than his previous cath in 2006. I think this explains his symptoms. We may be able to open this with a cutting balloon.  He has diffuse disease - not a great candidate for CABG. Intolerant to statins.  We will have him scheduled for PCI. We may have to put him on for another day given the busy schedule of the main cath lab.  Vesta Mixer, Montez Hageman., MD, [facility]  06/28/2012, 9:46 AM  Office - [phone]  Pager [phone]    PCI Impression:  1. Severe stenosis RCA, unstable angina.  2. Successful PTCA/DES x 2 after atherectomy mid and distal RCA  Recommendations: ASA and Plavix for at least one year.   Complications: None; patient tolerated the procedure well.   Assessment / Plan:  1. Chest pain/CAD - s/p overlapping stents to the RCA - doing much better clinically. On Plavix for at least one year.   2. HLD - on statin therapy  3. Tobacco abuse - resolved  He is doing well. May return to work  next week. See Dr. Elease Hashimoto back in 6 to 8 weeks. Check lipids on return. Not going to be able to do cardiac rehab - encouraged to walk at least 45 minutes per day.   Patient  is agreeable to this plan and will call if any problems develop in the interim.   Rosalio Macadamia, RN, ANP-C [facility] [address] [place], [place]  [address]

## 2012-07-21 ENCOUNTER — Other Ambulatory Visit: Payer: Self-pay | Admitting: *Deleted

## 2012-07-21 MED ORDER — HYDROCHLOROTHIAZIDE 25 MG PO TABS: 12.5000 mg | ORAL_TABLET | Freq: Every day | ORAL | Status: DC

## 2012-07-21 NOTE — Telephone Encounter (Signed)
Fax Received. Refill Completed. Krish Bailly Chowoe (R.M.A)   

## 2012-07-22 ENCOUNTER — Ambulatory Visit: Payer: BC Managed Care – PPO | Admitting: Cardiovascular Disease

## 2012-08-18 ENCOUNTER — Encounter: Payer: Self-pay | Admitting: Cardiovascular Disease

## 2012-08-29 ENCOUNTER — Encounter: Payer: Self-pay | Admitting: Cardiovascular Disease

## 2012-08-29 ENCOUNTER — Ambulatory Visit (INDEPENDENT_AMBULATORY_CARE_PROVIDER_SITE_OTHER): Payer: BC Managed Care – PPO | Admitting: Cardiovascular Disease

## 2012-08-29 ENCOUNTER — Ambulatory Visit (HOSPITAL_COMMUNITY)
Admission: RE | Admit: 2012-08-29 | Discharge: 2012-08-29 | Disposition: A | Discharge status: Home / Self Care | Payer: BC Managed Care – PPO | Source: Ambulatory Visit | Attending: Cardiovascular Disease | Admitting: Cardiovascular Disease

## 2012-08-29 VITALS — BP 110/68 | HR 72 | Ht 70.5 in | Wt 192.8 lb

## 2012-08-29 DIAGNOSIS — Z5181 Encounter for therapeutic drug level monitoring: Secondary | ICD-10-CM | POA: Insufficient documentation

## 2012-08-29 DIAGNOSIS — I2510 Atherosclerotic heart disease of native coronary artery without angina pectoris: Secondary | ICD-10-CM | POA: Insufficient documentation

## 2012-08-29 DIAGNOSIS — E785 Hyperlipidemia, unspecified: Secondary | ICD-10-CM

## 2012-08-29 DIAGNOSIS — Z7902 Long term (current) use of antithrombotics/antiplatelets: Secondary | ICD-10-CM | POA: Insufficient documentation

## 2012-08-29 LAB — PLATELET INHIBITION P2Y12: Platelet Function  P2Y12: 132 [PRU] — ABNORMAL LOW (ref 194–418)

## 2012-08-29 MED ORDER — CLOPIDOGREL BISULFATE 75 MG PO TABS: 75.0000 mg | ORAL_TABLET | Freq: Every day | ORAL | Status: DC

## 2012-08-29 MED ORDER — ATORVASTATIN CALCIUM 80 MG PO TABS: 80.0000 mg | ORAL_TABLET | Freq: Every day | ORAL | Status: DC

## 2012-08-29 MED ORDER — METOPROLOL TARTRATE 50 MG PO TABS: 25.0000 mg | ORAL_TABLET | Freq: Two times a day (BID) | ORAL | Status: DC

## 2012-08-29 MED ORDER — HYDROCHLOROTHIAZIDE 25 MG PO TABS: 12.5000 mg | ORAL_TABLET | Freq: Every day | ORAL | Status: DC

## 2012-08-29 NOTE — Progress Notes (Signed)
Daniel Carey Date of Birth  March 06, 1951       [facility]    [place] [address]  [address] [place], [place]  [address]   [place], [place]  [address] [phone]     [phone]   Fax  [phone]    Fax [phone]  Problem List: 1. CAD - 2.5 x 38 mm Promus Premier DES in the distal RCA and a 2.75 x 38 mm Promus Premier DES in the mid vessel in an overlapping fashion. The distal sten was post-dilated with a 2.5 x 20 mm Basye balloon. The proximal portion of the stent was post-dilated with a 3.0 x 20 mm Heeney balloon  2. Bradycardia after getting propafol for endoscopy. 3. Hypothyroidism   History of Present Illness:  Daniel Carey is feeling well today.  He's been getting some exercise. He does yard work and walks on the treadmill occasionally.  He had an episode of bradycardia after getting IV protocol for an endoscopy procedure. He was seen by Lawson Fiscal. His metoprolol dose was decreased.  He has also had a bronchoscopy with Dr. Sherene Sires. He does not have any evidence of COPD.  Both he and his wife have stop smoking.  ( Aug 20, 2011)  Feb. 28, 2014:  He continues to have increasing chest pain.  He has not been smoking.  The pains are associated with exertion, last 5-10 minutes, resolve with 1 SL NTG.   He's had a mildly elevated TSH for the past year or so and has had some fatigue.    his TSH level 6.8.      His medical doctor started him on Synthroid. He gradually has titrated up to 0.1 mg a day. Since that time he's had increasing episodes of chest pain.  Aug 29, 2012:  Daniel Carey saw him in February we performed a cardiac catheterization because of increasing chest pain. He was found to have sequential stenosis in his mid right coronary artery and had PCI of his right coronary artery ( 2.5 x 38 mm Promus Premier DES in the distal RCA and a 2.75 x 38 mm Promus Premier DES in the mid vessel in an overlapping fashion. The distal sten was  post-dilated with a 2.5 x 20 mm Lathrop balloon. The proximal portion of the stent was post-dilated with a 3.0 x 20 mm  balloon)  He and his wife are concerned about whether or not the Plavix is active.  His generic plavix manufacturer has changed.   He would like to be tested.   Current Outpatient Prescriptions on File Prior to Visit  Medication Sig Dispense Refill  . aspirin EC 81 MG tablet Take 81 mg by mouth daily.      Marland Kitchen atorvastatin (LIPITOR) 80 MG tablet Take 80 mg by mouth daily.      . Choline Fenofibrate 135 MG capsule Take 135 mg by mouth daily.      . clopidogrel (PLAVIX) 75 MG tablet Take 75 mg by mouth daily.      . famotidine (PEPCID) 20 MG tablet Take 20 mg by mouth daily as needed for heartburn.      . hydrochlorothiazide (HYDRODIURIL) 25 MG tablet Take 0.5 tablets (12.5 mg total) by mouth daily.  30 tablet  0  . levothyroxine (SYNTHROID, LEVOTHROID) 100 MCG tablet Take 25 mcg by mouth daily.      . metoprolol (LOPRESSOR) 50 MG tablet Take 25 mg by mouth 2 (two) times daily.      Marland Kitchen  naproxen sodium (ANAPROX) 220 MG tablet Take 220 mg by mouth daily as needed (headache).      . nitroGLYCERIN (NITROSTAT) 0.4 MG SL tablet Place 1 tablet (0.4 mg total) under the tongue every 5 (five) minutes as needed for chest pain.  25 tablet  3  . RABEprazole (ACIPHEX) 20 MG tablet Take 20 mg by mouth daily.       No current facility-administered medications on file prior to visit.    Allergies  Allergen Reactions  . Lisinopril Cough  . Amlodipine Besylate Other (See Comments)    Headache, weakness, dizziness  . Propofol Other (See Comments)    Heart rate dropped  . Rosuvastatin Other (See Comments)     muscle aches    Past Medical History  Diagnosis Date  . CAD (coronary artery disease)     moderate to severe distal CAD with large epicardial arteries having only mild to moderate irregularities. Last cath in Dec 2006; s/p cath with Promus DES in distal RCA and Promus DES in the mid  vessel in an overlapping fashion  . Chest pain   . HTN (hypertension)   . HLD (hyperlipidemia)   . GERD (gastroesophageal reflux disease)   . COPD (chronic obstructive pulmonary disease)   . Hiatal hernia   . External hemorrhoids without mention of complication   . Headache   . Nephrolithiasis   . ED (erectile dysfunction)   . Personal history of colonic polyps 03/12/2010    TUBULAR ADENOMA  . Esophageal stricture   . Tobacco abuse   . AV block, Mobitz 1     noted on EKG March 2013    Past Surgical History  Procedure Laterality Date  . Angioplasty      X3  . Video bronchoscopy  08/13/2011    Procedure: VIDEO BRONCHOSCOPY WITHOUT FLUORO;  Surgeon: Nyoka Cowden, MD;  Location: [facility];  Service: Cardiopulmonary;  Laterality: Bilateral;    History  Smoking status  . Former Smoker -- 0.50 packs/day for 40 years  . Types: Cigarettes  . Quit date: 08/25/2011  Smokeless tobacco  . Never Used    History  Alcohol Use  . 0.0 oz/week    Comment: rare    Family History  Problem Relation Age of Onset  . Heart disease Father     hx of MI  . Diabetes Mother     and brother   . Colon cancer Neg Hx   . Stomach cancer Neg Hx   . Breast cancer Sister     Reviw of Systems:  Reviewed in the HPI.  All other systems are negative.  Physical Exam: Blood pressure 110/68, pulse 72, height 5' 10.5" (1.791 m), weight 192 lb 12.8 oz (87.454 kg), SpO2 96.00%. General: Well developed, well nourished, in no acute distress.  Head: Normocephalic, atraumatic, sclera non-icteric, mucus membranes are moist,   Neck: Supple. Carotids are 2 + without bruits. No JVD  Lungs: Clear bilaterally to auscultation.  Heart: regular rate.  normal  S1 S2. No murmurs, gallops or rubs.  Abdomen: Soft, non-tender, non-distended with normal bowel sounds. No hepatomegaly. No rebound/guarding. No masses.  Msk:  Strength and tone are normal  Extremities: No clubbing or cyanosis. No edema.  Distal  pedal pulses are 2+ and equal bilaterally.  Neuro: Alert and oriented X 3. Moves all extremities spontaneously.  Psych:  Responds to questions appropriately with a normal affect.  ECG: Feb. 28,  2014:  NSR with 1st degree AV block,  Poor R wave progression. Assessment / Plan:

## 2012-08-29 NOTE — Assessment & Plan Note (Signed)
His lipids have been well controlled. We have some labs from his medical Dr. in his LDL is in acceptable range. We'll see him again in 6 months for repeat office visit and fasting labs.

## 2012-08-29 NOTE — Assessment & Plan Note (Signed)
Daniel Carey is doing well. He has 2 very long stents in his right coronary artery. Because of the length of the stenting I think it is prudent to know whether or not the Plavix is working for him. He would also like to know if the Plavix is active. We will check a P2Y12 level.  He has not restarted smoking .  He is active.  No angina.    Continue current meds.  I will see him in 6 months for OV and fasting labs.

## 2012-08-29 NOTE — Patient Instructions (Addendum)
Your physician recommends that you return for lab work in: today. P2Y12 pt to go to [facility]   Your physician wants you to follow-up in: 6 months You will receive a reminder letter in the mail two months in advance. If you don't receive a letter, please call our office to schedule the follow-up appointment.   Your physician recommends that you continue on your current medications as directed. Please refer to the Current Medication list given to you today.

## 2012-09-21 ENCOUNTER — Ambulatory Visit: Payer: BC Managed Care – PPO | Admitting: Cardiovascular Disease

## 2012-10-14 ENCOUNTER — Encounter: Payer: Self-pay | Admitting: Cardiovascular Disease

## 2012-10-14 ENCOUNTER — Ambulatory Visit (INDEPENDENT_AMBULATORY_CARE_PROVIDER_SITE_OTHER): Payer: BC Managed Care – PPO | Admitting: Cardiovascular Disease

## 2012-10-14 VITALS — BP 126/78 | HR 72 | Ht 70.5 in | Wt 190.0 lb

## 2012-10-14 DIAGNOSIS — R0989 Other specified symptoms and signs involving the circulatory and respiratory systems: Secondary | ICD-10-CM

## 2012-10-14 DIAGNOSIS — R0600 Dyspnea, unspecified: Secondary | ICD-10-CM

## 2012-10-14 DIAGNOSIS — R079 Chest pain, unspecified: Secondary | ICD-10-CM

## 2012-10-14 DIAGNOSIS — I2510 Atherosclerotic heart disease of native coronary artery without angina pectoris: Secondary | ICD-10-CM

## 2012-10-14 DIAGNOSIS — R0609 Other forms of dyspnea: Secondary | ICD-10-CM

## 2012-10-14 NOTE — Patient Instructions (Addendum)
Your physician has requested that you have en exercise stress myoview.  Please follow instruction sheet, as given.   Your physician wants you to follow-up in: 6 months  You will receive a reminder letter in the mail two months in advance. If you don't receive a letter, please call our office to schedule the follow-up appointment.   Your physician recommends that you return for a FASTING lipid profile: 6 months

## 2012-10-14 NOTE — Progress Notes (Signed)
Daniel Carey Date of Birth  04-12-1951       [facility]    [place] [address]  [address] [place], [place]  [address]   [place], [place]  [address] [phone]     [phone]   Fax  [phone]    Fax [phone]  Problem List: 1. CAD - 2.5 x 38 mm Promus Premier DES in the distal RCA and a 2.75 x 38 mm Promus Premier DES in the mid vessel in an overlapping fashion. The distal sten was post-dilated with a 2.5 x 20 mm [place] balloon. The proximal portion of the stent was post-dilated with a 3.0 x 20 mm [place] balloon ( June 30, 2012)  2. Bradycardia after getting propafol for endoscopy.    History of Present Illness:  Daniel Carey is feeling well today.  He's been getting some exercise. He does yard work and walks on the treadmill occasionally. He had an episode of bradycardia after getting IV protocol for an endoscopy procedure. He was seen by Daniel Carey. His metoprolol dose was decreased. He has also had a bronchoscopy with Dr. Sherene Carey. He does not have any evidence of COPD.  Both he and his wife have stop smoking.  ( Aug 20, 2011)  Feb. 28, 2014:  He continues to have increasing chest pain.  He has not been smoking.  The pains are associated with exertion, last 5-10 minutes, resolve with 1 SL NTG.   He's had a mildly elevated TSH for the past year or so and has had some fatigue.    his TSH level 6.8.      His medical doctor started him on Synthroid. He gradually has titrated up to 0.1 mg a day. Since that time he's had increasing episodes of chest pain.  Aug 29, 2012:  Daniel Carey saw him in February we performed a cardiac catheterization because of increasing chest pain. He was found to have sequential stenosis in his mid right coronary artery and had PCI of his right coronary artery ( 2.5 x 38 mm Promus Premier DES in the distal RCA and a 2.75 x 38 mm Promus Premier DES in the mid vessel in an overlapping fashion. The distal sten was post-dilated  with a 2.5 x 20 mm [place] balloon. The proximal portion of the stent was post-dilated with a 3.0 x 20 mm [place] balloon)  He and his wife are concerned about whether or not the Plavix is active.  His generic plavix manufacturer has changed.   He would like to be tested.   October 14, 2012:  [name] has been having some occasional CP.  He has had some episodes of dyspnea while climbing up a hill playing golf.  This past Monday night, he had some chest pain - possible due to indigestions.  these  He had lost his NTG - he has refilled it now.      Current Outpatient Prescriptions on File Prior to Visit  Medication Sig Dispense Refill  . aspirin EC 81 MG tablet Take 81 mg by mouth daily.      Marland Kitchen atorvastatin (LIPITOR) 80 MG tablet Take 1 tablet (80 mg total) by mouth daily.  30 tablet  5  . Choline Fenofibrate 135 MG capsule Take 135 mg by mouth daily.      . clopidogrel (PLAVIX) 75 MG tablet Take 1 tablet (75 mg total) by mouth daily.  30 tablet  11  . famotidine (PEPCID) 20 MG tablet  Take 20 mg by mouth daily as needed for heartburn.      . hydrochlorothiazide (HYDRODIURIL) 25 MG tablet Take 0.5 tablets (12.5 mg total) by mouth daily.  30 tablet  11  . metoprolol (LOPRESSOR) 50 MG tablet Take 0.5 tablets (25 mg total) by mouth 2 (two) times daily.  30 tablet  11  . naproxen sodium (ANAPROX) 220 MG tablet Take 220 mg by mouth daily as needed (headache).      . nitroGLYCERIN (NITROSTAT) 0.4 MG SL tablet Place 1 tablet (0.4 mg total) under the tongue every 5 (five) minutes as needed for chest pain.  25 tablet  3  . RABEprazole (ACIPHEX) 20 MG tablet Take 20 mg by mouth daily.      Marland Kitchen levothyroxine (SYNTHROID, LEVOTHROID) 100 MCG tablet Take 25 mcg by mouth daily.       No current facility-administered medications on file prior to visit.    Allergies  Allergen Reactions  . Lisinopril Cough  . Amlodipine Besylate Other (See Comments)    Headache, weakness, dizziness  . Propofol Other (See Comments)     Heart rate dropped  . Rosuvastatin Other (See Comments)     muscle aches    Past Medical History  Diagnosis Date  . CAD (coronary artery disease)     moderate to severe distal CAD with large epicardial arteries having only mild to moderate irregularities. Last cath in Dec 2006; s/p cath with Promus DES in distal RCA and Promus DES in the mid vessel in an overlapping fashion  . Chest pain   . HTN (hypertension)   . HLD (hyperlipidemia)   . GERD (gastroesophageal reflux disease)   . COPD (chronic obstructive pulmonary disease)   . Hiatal hernia   . External hemorrhoids without mention of complication   . Headache(784.0)   . Nephrolithiasis   . ED (erectile dysfunction)   . Personal history of colonic polyps 03/12/2010    TUBULAR ADENOMA  . Esophageal stricture   . Tobacco abuse   . AV block, Mobitz 1     noted on EKG March 2013    Past Surgical History  Procedure Laterality Date  . Angioplasty      X3  . Video bronchoscopy  08/13/2011    Procedure: VIDEO BRONCHOSCOPY WITHOUT FLUORO;  Surgeon: Nyoka Cowden, MD;  Location: [facility];  Service: Cardiopulmonary;  Laterality: Bilateral;    History  Smoking status  . Former Smoker -- 0.50 packs/day for 40 years  . Types: Cigarettes  . Quit date: 08/25/2011  Smokeless tobacco  . Never Used    History  Alcohol Use  . 0.0 oz/week    Comment: rare    Family History  Problem Relation Age of Onset  . Heart disease Father     hx of MI  . Diabetes Mother     and brother   . Colon cancer Neg Hx   . Stomach cancer Neg Hx   . Breast cancer Sister     Reviw of Systems:  Reviewed in the HPI.  All other systems are negative.  Physical Exam: Blood pressure 126/78, pulse 72, height 5' 10.5" (1.791 m), weight 190 lb (86.183 kg), SpO2 97.00%. General: Well developed, well nourished, in no acute distress.  Head: Normocephalic, atraumatic, sclera non-icteric, mucus membranes are moist,   Neck: Supple. Carotids are 2 +  without bruits. No JVD  Lungs: Clear bilaterally to auscultation.  Heart: regular rate.  normal  S1 S2. No murmurs, gallops or  rubs.  Abdomen: Soft, non-tender, non-distended with normal bowel sounds. No hepatomegaly. No rebound/guarding. No masses.  Msk:  Strength and tone are normal  Extremities: No clubbing or cyanosis. No edema.  Distal pedal pulses are 2+ and equal bilaterally.  Neuro: Alert and oriented X 3. Moves all extremities spontaneously.  Psych:  Responds to questions appropriately with a normal affect.  ECG:  Assessment / Plan:

## 2012-10-14 NOTE — Assessment & Plan Note (Addendum)
Daniel Carey has a history of coronary artery disease. He had 2 stents placed in early 2014.  He's been having several types of chest discomfort. He has dyspnea with exertion-shortness of breath and chest pain when he walks up hills while playing golf. He also notes some very brief atypical episodes of chest pain that typically occur when he is lying down at night. I think that the shortness of breath and chest discomfort while walking up hills is somewhat worrisome and may represent angina. I would like to schedule a stress Myoview study for further evaluation of this.  The episodes of chest discomfort that occurred in the middle of the night and lasted one or 2 seconds is more likely to be reflux. Also has a hiatal hernia. I have asked him to see his general medical doctor or his gastroenterologist for further recommendations for his reflux.  He has been placed on synthroid for fatigue.  He stopped the synthroid and his TSH is normal.

## 2012-10-14 NOTE — Assessment & Plan Note (Signed)
His lipid levels were acceptable at his last office visit with Dr. Lysbeth Galas.  We'll check his lipids again when I see him again in 6 months. We'll also check a liver enzymes and basic metabolic profile.

## 2012-10-19 ENCOUNTER — Encounter (HOSPITAL_COMMUNITY): Payer: BC Managed Care – PPO

## 2012-10-24 ENCOUNTER — Encounter: Payer: Self-pay | Admitting: Cardiovascular Disease

## 2012-10-24 ENCOUNTER — Ambulatory Visit (HOSPITAL_COMMUNITY): Payer: BC Managed Care – PPO | Attending: Cardiology | Admitting: Radiology

## 2012-10-24 VITALS — BP 132/86 | HR 65 | Ht 70.5 in | Wt 188.0 lb

## 2012-10-24 DIAGNOSIS — Z87891 Personal history of nicotine dependence: Secondary | ICD-10-CM | POA: Insufficient documentation

## 2012-10-24 DIAGNOSIS — R42 Dizziness and giddiness: Secondary | ICD-10-CM | POA: Insufficient documentation

## 2012-10-24 DIAGNOSIS — Z8249 Family history of ischemic heart disease and other diseases of the circulatory system: Secondary | ICD-10-CM | POA: Insufficient documentation

## 2012-10-24 DIAGNOSIS — I471 Supraventricular tachycardia: Secondary | ICD-10-CM

## 2012-10-24 DIAGNOSIS — R5383 Other fatigue: Secondary | ICD-10-CM | POA: Insufficient documentation

## 2012-10-24 DIAGNOSIS — I10 Essential (primary) hypertension: Secondary | ICD-10-CM | POA: Insufficient documentation

## 2012-10-24 DIAGNOSIS — R0609 Other forms of dyspnea: Secondary | ICD-10-CM | POA: Insufficient documentation

## 2012-10-24 DIAGNOSIS — I2510 Atherosclerotic heart disease of native coronary artery without angina pectoris: Secondary | ICD-10-CM

## 2012-10-24 DIAGNOSIS — J45909 Unspecified asthma, uncomplicated: Secondary | ICD-10-CM | POA: Insufficient documentation

## 2012-10-24 DIAGNOSIS — Z9861 Coronary angioplasty status: Secondary | ICD-10-CM | POA: Insufficient documentation

## 2012-10-24 DIAGNOSIS — R079 Chest pain, unspecified: Secondary | ICD-10-CM

## 2012-10-24 DIAGNOSIS — R0600 Dyspnea, unspecified: Secondary | ICD-10-CM

## 2012-10-24 DIAGNOSIS — R0789 Other chest pain: Secondary | ICD-10-CM | POA: Insufficient documentation

## 2012-10-24 DIAGNOSIS — R0602 Shortness of breath: Secondary | ICD-10-CM

## 2012-10-24 DIAGNOSIS — R110 Nausea: Secondary | ICD-10-CM | POA: Insufficient documentation

## 2012-10-24 DIAGNOSIS — R5381 Other malaise: Secondary | ICD-10-CM | POA: Insufficient documentation

## 2012-10-24 DIAGNOSIS — E785 Hyperlipidemia, unspecified: Secondary | ICD-10-CM | POA: Insufficient documentation

## 2012-10-24 DIAGNOSIS — R61 Generalized hyperhidrosis: Secondary | ICD-10-CM | POA: Insufficient documentation

## 2012-10-24 DIAGNOSIS — R0989 Other specified symptoms and signs involving the circulatory and respiratory systems: Secondary | ICD-10-CM | POA: Insufficient documentation

## 2012-10-24 MED ORDER — TECHNETIUM TC 99M SESTAMIBI GENERIC - CARDIOLITE
11.0000 | Freq: Once | INTRAVENOUS | Status: AC | PRN
  Administered 2012-10-24: 11 via INTRAVENOUS

## 2012-10-24 MED ORDER — TECHNETIUM TC 99M SESTAMIBI GENERIC - CARDIOLITE
33.0000 | Freq: Once | INTRAVENOUS | Status: AC | PRN
  Administered 2012-10-24: 33 via INTRAVENOUS

## 2012-10-24 MED ORDER — REGADENOSON 0.4 MG/5ML IV SOLN: 0.4000 mg | Freq: Once | INTRAVENOUS | Status: DC

## 2012-10-24 NOTE — Progress Notes (Signed)
[facility] SITE 3 NUCLEAR MED [address] [place], [place] [address] [phone]    Cardiology Nuclear Med Study  Daniel Carey is a 62 y.o. male     MRN : [id number]     DOB: 1950/08/19  Procedure Date: 10/24/2012  Nuclear Med Background Indication for Stress Test:  Evaluation for Ischemia and PTCA/Stent Patency History:  Asthma and '11 ION:GEXBMW, EF=70%; '14 PTCA/Stents-RCA Cardiac Risk Factors: Family History - CAD, History of Smoking, Hypertension and Lipids  Symptoms:  Chest Pain/Pressure and Tightness with and without Exertion (last episode of chest discomfort was about 2-weeks ago), Diaphoresis, Dizziness, DOE, Fatigue and Nausea.     Nuclear Pre-Procedure Caffeine/Decaff Intake:  None > 12 hrs NPO After: 6:30pm   Lungs:  Clear. O2 Sat: 98% on room air. IV 0.9% NS with Angio Cath:  20g  IV Site: R Antecubital x 1, tolerated well IV Started by:  Irean Hong, RN  Chest Size (in):  44 Cup Size: n/a  Height: 5' 10.5" (1.791 m)  Weight:  188 lb (85.276 kg)  BMI:  Body mass index is 26.58 kg/(m^2). Tech Comments:  Held metoprolol x 36 hrs    Nuclear Med Study 1 or 2 day study: 1 day  Stress Test Type:  Stress  Reading MD: Cassell Clement, MD  Order Authorizing Provider:  Kristeen Miss, MD  Resting Radionuclide: Technetium 51m Sestamibi  Resting Radionuclide Dose: 11.0 mCi   Stress Radionuclide:  Technetium 26m Sestamibi  Stress Radionuclide Dose: 33.0 mCi           Stress Protocol Rest HR: 65 Stress HR: 133  Rest BP: 132/86 Stress BP: 209/81  Exercise Time (min): 7:15 METS: 7.0   Predicted Max HR: 160 bpm % Max HR: 83.12 bpm Rate Pressure Product: 41324   Dose of Adenosine (mg):  n/a Dose of Lexiscan: n/a mg  Dose of Atropine (mg): n/a Dose of Dobutamine: n/a mcg/kg/min (at max HR)  Stress Test Technologist: Smiley Houseman, CMA-N  Nuclear Technologist:  Doyne Keel, CNMT     Rest Procedure:  Myocardial perfusion imaging was performed at rest  45 minutes following the intravenous administration of Technetium 68m Sestamibi.  Rest ECG: NSR - Normal EKG. First degree AV block at rest.  Stress Procedure:  The patient exercised on the treadmill utilizing the Bruce Protocol for 7:15 minutes. The patient stopped due to dyspnea associated with tachy-brady episode and denied any chest pain.  Technetium 54m Sestamibi was injected at peak exercise and myocardial perfusion imaging was performed after a brief delay.  Stress ECG: With exercise stress patient develops Wenckebach second degree heart block. Also had short run of SVT. No significant ST changes to suggest ischemia.  QPS Raw Data Images:  Mild diaphragmatic attenuation.  Normal left ventricular size. Stress Images:  There is decreased uptake in the inferior wall. Rest Images:  There is decreased uptake in the inferior wall. Subtraction (SDS):  No evidence of ischemia. Transient Ischemic Dilatation (Normal <1.22):  NA Lung/Heart Ratio (Normal <0.45):  0.44  Quantitative Gated Spect Images QGS EDV:  56 ml QGS ESV:  15 ml  Impression Exercise Capacity:  Good exercise capacity. BP Response:  Normal blood pressure response. Clinical Symptoms:  No chest pain. ECG Impression:  No significant ST segment change suggestive of ischemia. Comparison with Prior Nuclear Study: No images to compare  Overall Impression:  Low risk stress nuclear study There is decreased inferior wall uptake on rest and stress images.  This is  suggestive of inferior wall attenuation although old small inferior wall infarct cannot be excluded.  LV Ejection Fraction: 73%.  LV Wall Motion:  NL LV Function; NL Wall Motion   [employer]

## 2012-11-17 ENCOUNTER — Other Ambulatory Visit: Payer: Self-pay | Admitting: Gastroenterology

## 2012-11-17 NOTE — Telephone Encounter (Signed)
Patient needs an office visit for further refills 

## 2012-12-14 ENCOUNTER — Other Ambulatory Visit: Payer: Self-pay | Admitting: Dermatology

## 2012-12-28 ENCOUNTER — Other Ambulatory Visit: Payer: Self-pay | Admitting: Gastroenterology

## 2012-12-28 ENCOUNTER — Telehealth: Payer: Self-pay | Admitting: Cardiovascular Disease

## 2012-12-28 NOTE — Telephone Encounter (Signed)
New problem  Pt's wife calling about ab results.

## 2012-12-28 NOTE — Telephone Encounter (Addendum)
Per wife/ pt had labs done yesterday with pcp Dr Lysbeth Galas in [place].  Per wife, thyroid  labs were slightly off but pt and pcp want you to review and add your input to restarting a medication// still on his list= synthroid. I have not seen labs in the office yet.

## 2012-12-30 ENCOUNTER — Other Ambulatory Visit: Payer: Self-pay | Admitting: Gastroenterology

## 2013-01-02 ENCOUNTER — Other Ambulatory Visit: Payer: Self-pay | Admitting: Gastroenterology

## 2013-01-04 ENCOUNTER — Telehealth: Payer: Self-pay | Admitting: Gastroenterology

## 2013-01-04 MED ORDER — RABEPRAZOLE SODIUM 20 MG PO TBEC: 20.0000 mg | DELAYED_RELEASE_TABLET | Freq: Every day | ORAL | Status: DC

## 2013-01-04 NOTE — Telephone Encounter (Signed)
RX sent

## 2013-01-07 NOTE — Telephone Encounter (Signed)
I will be happy to make a note of his medications but I would prefer that his medical doctor manage his thyroid meds.  If he has questions about his medical doctor managing his thyroid meds, he should probably get an endocrinologist.

## 2013-01-31 ENCOUNTER — Ambulatory Visit (INDEPENDENT_AMBULATORY_CARE_PROVIDER_SITE_OTHER): Payer: BC Managed Care – PPO | Admitting: Gastroenterology

## 2013-01-31 ENCOUNTER — Encounter: Payer: Self-pay | Admitting: Gastroenterology

## 2013-01-31 VITALS — BP 136/78 | HR 68 | Ht 70.5 in | Wt 193.0 lb

## 2013-01-31 DIAGNOSIS — R141 Gas pain: Secondary | ICD-10-CM

## 2013-01-31 DIAGNOSIS — M6208 Separation of muscle (nontraumatic), other site: Secondary | ICD-10-CM

## 2013-01-31 DIAGNOSIS — K219 Gastro-esophageal reflux disease without esophagitis: Secondary | ICD-10-CM

## 2013-01-31 DIAGNOSIS — M6200 Separation of muscle (nontraumatic), unspecified site: Secondary | ICD-10-CM

## 2013-01-31 DIAGNOSIS — R140 Abdominal distension (gaseous): Secondary | ICD-10-CM

## 2013-01-31 DIAGNOSIS — R109 Unspecified abdominal pain: Secondary | ICD-10-CM

## 2013-01-31 MED ORDER — RABEPRAZOLE SODIUM 20 MG PO TBEC: 20.0000 mg | DELAYED_RELEASE_TABLET | Freq: Every day | ORAL | Status: DC

## 2013-01-31 NOTE — Progress Notes (Signed)
This is a 62 year old white male with a long history of GERD doing well on daily AcipHex.  His wife is with him during his exam, and they both complain of abdominal gas, bloating, change in the patients abdominal wall structure.  Since he was last seen he said 2 stents placed by cardiology.  Besides gas and bloating he denies GI complaints.  He specifically denies melena, hematochezia, dysphagia, nausea and vomiting, or any systemic complaints.  Current Medications, Allergies, Past Medical History, Past Surgical History, Family History and Social History were reviewed in [employer] record.  ROS: All systems were reviewed and are negative unless otherwise stated in the HPI.          Physical Exam: Blood pressure 136/78, pulse 66 and weight 193 with a BMI of 27.29.  His abdomen shows a diastases rectus abnormality with some distention but no organomegaly, masses or tenderness.  Bowel sounds are normal.  I cannot appreciate any definite ascites.  Mental status is clear.    Assessment and Plan: GERD doing well on daily PPI therapy.  He has nonspecific gas and bloating which is probably optional in nature.  At the request scheduled upper abdominal ultrasound exam, and we will try daily probiotic therapy.  I explained his wife and he in detail diet stasis, agent of abdominal wall, and is rather normal body habitus for in his age.  He is otherwise continue medications as listed and reviewed

## 2013-01-31 NOTE — Patient Instructions (Addendum)
Please purchase Align over the counter, this puts good bacteria back into your colon. You should take 1 capsule by mouth once daily.(coupon given today)  New prescription for Aciphex was sent to your pharmacy   You have been scheduled for an abdominal ultrasound at [facility] Radiology (1st floor of hospital) on 02-02-2013 at 11 am. Please arrive 15 minutes prior to your appointment for registration. Make certain not to have anything to eat or drink 6 hours prior to your appointment. Should you need to reschedule your appointment, please contact radiology at [phone]. This test typically takes about 30 minutes to perform.

## 2013-02-02 ENCOUNTER — Ambulatory Visit (HOSPITAL_COMMUNITY)
Admission: RE | Admit: 2013-02-02 | Discharge: 2013-02-02 | Disposition: A | Discharge status: Home / Self Care | Payer: BC Managed Care – PPO | Source: Ambulatory Visit | Attending: Gastroenterology | Admitting: Gastroenterology

## 2013-02-02 DIAGNOSIS — R140 Abdominal distension (gaseous): Secondary | ICD-10-CM

## 2013-02-02 DIAGNOSIS — K7689 Other specified diseases of liver: Secondary | ICD-10-CM | POA: Insufficient documentation

## 2013-02-02 DIAGNOSIS — I77819 Aortic ectasia, unspecified site: Secondary | ICD-10-CM | POA: Insufficient documentation

## 2013-02-02 DIAGNOSIS — R109 Unspecified abdominal pain: Secondary | ICD-10-CM | POA: Insufficient documentation

## 2013-02-02 DIAGNOSIS — D7389 Other diseases of spleen: Secondary | ICD-10-CM | POA: Insufficient documentation

## 2013-02-27 ENCOUNTER — Other Ambulatory Visit: Payer: BC Managed Care – PPO

## 2013-02-27 ENCOUNTER — Encounter: Payer: Self-pay | Admitting: Cardiovascular Disease

## 2013-02-27 ENCOUNTER — Ambulatory Visit (INDEPENDENT_AMBULATORY_CARE_PROVIDER_SITE_OTHER): Payer: BC Managed Care – PPO | Admitting: Cardiovascular Disease

## 2013-02-27 VITALS — BP 124/70 | HR 62 | Resp 14 | Ht 70.0 in | Wt 196.0 lb

## 2013-02-27 DIAGNOSIS — I2510 Atherosclerotic heart disease of native coronary artery without angina pectoris: Secondary | ICD-10-CM

## 2013-02-27 DIAGNOSIS — E785 Hyperlipidemia, unspecified: Secondary | ICD-10-CM

## 2013-02-27 LAB — BASIC METABOLIC PANEL
BUN: 24 mg/dL — ABNORMAL HIGH (ref 6–23)
CO2: 29 mEq/L (ref 19–32)
Calcium: 9.5 mg/dL (ref 8.4–10.5)
Chloride: 101 mEq/L (ref 96–112)
Creatinine, Ser: 1.2 mg/dL (ref 0.4–1.5)
GFR: 63.52 mL/min (ref >60.00)
Glucose, Bld: 136 mg/dL — ABNORMAL HIGH (ref 70–99)
Potassium: 4.9 mEq/L (ref 3.5–5.1)
Sodium: 137 mEq/L (ref 135–145)

## 2013-02-27 LAB — LIPID PANEL
Cholesterol: 175 mg/dL (ref 0–200)
HDL: 44.9 mg/dL (ref >39.00)
Total CHOL/HDL Ratio: 4
Triglycerides: 208 mg/dL — ABNORMAL HIGH (ref 0.0–149.0)
VLDL: 41.6 mg/dL — ABNORMAL HIGH (ref 0.0–40.0)

## 2013-02-27 LAB — HEPATIC FUNCTION PANEL
ALT: 27 U/L (ref 0–53)
AST: 16 U/L (ref 0–37)
Albumin: 4 g/dL (ref 3.5–5.2)
Alkaline Phosphatase: 59 U/L (ref 39–117)
Bilirubin, Direct: 0 mg/dL (ref 0.0–0.3)
Total Bilirubin: 0.5 mg/dL (ref 0.3–1.2)
Total Protein: 7.1 g/dL (ref 6.0–8.3)

## 2013-02-27 LAB — LDL CHOLESTEROL, DIRECT: Direct LDL: 107 mg/dL

## 2013-02-27 NOTE — Patient Instructions (Signed)
Your physician wants you to follow-up in:6 months with ekg   You will receive a reminder letter in the mail two months in advance. If you don't receive a letter, please call our office to schedule the follow-up appointment.   Your physician recommends that you return for a FASTING lipid profile: 6 months

## 2013-02-27 NOTE — Progress Notes (Signed)
Daniel Carey Date of Birth  May 28, 1950       [facility]    [place] [address]  [address] [place], [place]  [address]   [place], [place]  [address] [phone]     [phone]   Fax  [phone]    Fax [phone]  Problem List: 1. CAD - 2.5 x 38 mm Promus Premier DES in the distal RCA and a 2.75 x 38 mm Promus Premier DES in the mid vessel in an overlapping fashion. The distal sten was post-dilated with a 2.5 x 20 mm [place] balloon. The proximal portion of the stent was post-dilated with a 3.0 x 20 mm [place] balloon ( June 30, 2012)  2. Bradycardia after getting propafol for endoscopy.    History of Present Illness:  Daniel Carey is feeling well today.  He's been getting some exercise. He does yard work and walks on the treadmill occasionally. He had an episode of bradycardia after getting IV protocol for an endoscopy procedure. He was seen by Daniel Carey. His metoprolol dose was decreased. He has also had a bronchoscopy with Dr. Sherene Carey. He does not have any evidence of COPD.  Both he and his wife have stop smoking.  ( Aug 20, 2011)  Feb. 28, 2014:  He continues to have increasing chest pain.  He has not been smoking.  The pains are associated with exertion, last 5-10 minutes, resolve with 1 SL NTG.   He's had a mildly elevated TSH for the past year or so and has had some fatigue.    his TSH level 6.8.      His medical doctor started him on Synthroid. He gradually has titrated up to 0.1 mg a day. Since that time he's had increasing episodes of chest pain.  Aug 29, 2012:  [name] saw him in February we performed a cardiac catheterization because of increasing chest pain. He was found to have sequential stenosis in his mid right coronary artery and had PCI of his right coronary artery ( 2.5 x 38 mm Promus Premier DES in the distal RCA and a 2.75 x 38 mm Promus Premier DES in the mid vessel in an overlapping fashion. The distal sten was post-dilated  with a 2.5 x 20 mm [place] balloon. The proximal portion of the stent was post-dilated with a 3.0 x 20 mm [place] balloon)  He and his wife are concerned about whether or not the Plavix is active.  His generic plavix manufacturer has changed.   He would like to be tested.   October 14, 2012:  [name] has been having some occasional CP.  He has had some episodes of dyspnea while climbing up a hill playing golf.  This past Monday night, he had some chest pain - possible due to indigestions.  these  He had lost his NTG - he has refilled it now.    Nov. 10, 2014:  Daniel Carey is doing well.  He is working too much according to wife. he's had a few CP but not severe enough to take NTG.  Still has some dyspnea with climbing steps.   He had stenting in march 2014.    Current Outpatient Prescriptions on File Prior to Visit  Medication Sig Dispense Refill  . aspirin EC 81 MG tablet Take 81 mg by mouth daily.      Marland Kitchen atorvastatin (LIPITOR) 80 MG tablet Take 1 tablet (80 mg total) by mouth daily.  30 tablet  5  . Choline Fenofibrate 135 MG capsule Take 135 mg by mouth daily.      . clopidogrel (PLAVIX) 75 MG tablet Take 1 tablet (75 mg total) by mouth daily.  30 tablet  11  . famotidine (PEPCID) 20 MG tablet Take 20 mg by mouth daily as needed for heartburn.      . hydrochlorothiazide (HYDRODIURIL) 25 MG tablet Take 0.5 tablets (12.5 mg total) by mouth daily.  30 tablet  11  . metoprolol (LOPRESSOR) 50 MG tablet Take 0.5 tablets (25 mg total) by mouth 2 (two) times daily.  30 tablet  11  . naproxen sodium (ANAPROX) 220 MG tablet Take 220 mg by mouth daily as needed (headache).      . nitroGLYCERIN (NITROSTAT) 0.4 MG SL tablet Place 1 tablet (0.4 mg total) under the tongue every 5 (five) minutes as needed for chest pain.  25 tablet  3  . RABEprazole (ACIPHEX) 20 MG tablet Take 1 tablet (20 mg total) by mouth daily.  30 tablet  11   No current facility-administered medications on file prior to visit.    Allergies   Allergen Reactions  . Lisinopril Cough  . Amlodipine Besylate Other (See Comments)    Headache, weakness, dizziness  . Propofol Other (See Comments)    Heart rate dropped  . Rosuvastatin Other (See Comments)     muscle aches    Past Medical History  Diagnosis Date  . CAD (coronary artery disease)     moderate to severe distal CAD with large epicardial arteries having only mild to moderate irregularities. Last cath in Dec 2006; s/p cath with Promus DES in distal RCA and Promus DES in the mid vessel in an overlapping fashion  . Chest pain   . HTN (hypertension)   . HLD (hyperlipidemia)   . GERD (gastroesophageal reflux disease)   . COPD (chronic obstructive pulmonary disease)   . Hiatal hernia   . External hemorrhoids without mention of complication   . Headache(784.0)   . Nephrolithiasis   . ED (erectile dysfunction)   . Personal history of colonic polyps 03/12/2010    TUBULAR ADENOMA  . Esophageal stricture   . Tobacco abuse   . AV block, Mobitz 1     noted on EKG March 2013    Past Surgical History  Procedure Laterality Date  . Angioplasty      X3  . Video bronchoscopy  08/13/2011    Procedure: VIDEO BRONCHOSCOPY WITHOUT FLUORO;  Surgeon: Nyoka Cowden, MD;  Location: [facility];  Service: Cardiopulmonary;  Laterality: Bilateral;    History  Smoking status  . Former Smoker -- 0.50 packs/day for 40 years  . Types: Cigarettes  . Quit date: 08/25/2011  Smokeless tobacco  . Never Used    History  Alcohol Use  . 0.0 oz/week    Comment: rare    Family History  Problem Relation Age of Onset  . Heart disease Father     hx of MI  . Diabetes Mother     and brother   . Colon cancer Neg Hx   . Stomach cancer Neg Hx   . Breast cancer Sister     Reviw of Systems:  Reviewed in the HPI.  All other systems are negative.  Physical Exam: Blood pressure 124/70, pulse 62, resp. rate 14, height 5\' 10"  (1.778 m), weight 196 lb (88.905 kg). General: Well developed,  well nourished, in no acute distress.  Head: Normocephalic, atraumatic, sclera non-icteric, mucus membranes  are moist,   Neck: Supple. Carotids are 2 + without bruits. No JVD  Lungs: Clear bilaterally to auscultation.  Heart: regular rate.  normal  S1 S2. No murmurs, gallops or rubs.  Abdomen: Soft, non-tender, non-distended with normal bowel sounds. No hepatomegaly. No rebound/guarding. No masses.  Msk:  Strength and tone are normal  Extremities: No clubbing or cyanosis. No edema.  Distal pedal pulses are 2+ and equal bilaterally.  Neuro: Alert and oriented X 3. Moves all extremities spontaneously.  Psych:  Responds to questions appropriately with a normal affect.  ECG:  Assessment / Plan:

## 2013-02-27 NOTE — Assessment & Plan Note (Signed)
Daniel Carey is doing well. He's not having episodes of chest pain. He does have occasional episodes of shortness breath with exertion. I've encouraged him to exercise on a regular basis. We have drawn a fasting lipids today.  I'll see him in 6 months for followup office visit as well as fasting labs.

## 2013-03-30 ENCOUNTER — Other Ambulatory Visit: Payer: Self-pay | Admitting: *Deleted

## 2013-03-30 ENCOUNTER — Encounter: Payer: Self-pay | Admitting: *Deleted

## 2013-03-30 NOTE — Telephone Encounter (Signed)
Patient states no need for PA lipitor he got generic and paid $12.00 for them

## 2013-06-09 ENCOUNTER — Telehealth: Payer: Self-pay | Admitting: Cardiovascular Disease

## 2013-06-09 DIAGNOSIS — E785 Hyperlipidemia, unspecified: Secondary | ICD-10-CM

## 2013-06-09 NOTE — Telephone Encounter (Signed)
Pt requested scheduled fasting labs as per last office visit. Orders placed. Horton Chin RN

## 2013-06-09 NOTE — Telephone Encounter (Signed)
New Prob     Fasting labs for 5/18 visit, orders needed in EPIC.

## 2013-08-30 DIAGNOSIS — R5383 Other fatigue: Secondary | ICD-10-CM

## 2013-08-30 DIAGNOSIS — R5381 Other malaise: Secondary | ICD-10-CM | POA: Insufficient documentation

## 2013-08-30 DIAGNOSIS — K219 Gastro-esophageal reflux disease without esophagitis: Secondary | ICD-10-CM | POA: Insufficient documentation

## 2013-08-31 ENCOUNTER — Other Ambulatory Visit: Payer: Self-pay | Admitting: Cardiovascular Disease

## 2013-09-04 ENCOUNTER — Ambulatory Visit: Payer: BC Managed Care – PPO | Admitting: Cardiovascular Disease

## 2013-09-04 ENCOUNTER — Other Ambulatory Visit: Payer: BC Managed Care – PPO

## 2013-09-30 ENCOUNTER — Other Ambulatory Visit: Payer: Self-pay | Admitting: Cardiovascular Disease

## 2013-10-23 ENCOUNTER — Encounter: Payer: Self-pay | Admitting: Cardiovascular Disease

## 2013-10-23 ENCOUNTER — Ambulatory Visit (INDEPENDENT_AMBULATORY_CARE_PROVIDER_SITE_OTHER): Payer: BC Managed Care – PPO | Admitting: Cardiovascular Disease

## 2013-10-23 ENCOUNTER — Other Ambulatory Visit (INDEPENDENT_AMBULATORY_CARE_PROVIDER_SITE_OTHER): Payer: BC Managed Care – PPO

## 2013-10-23 VITALS — BP 130/84 | HR 66 | Ht 70.0 in | Wt 188.0 lb

## 2013-10-23 DIAGNOSIS — E785 Hyperlipidemia, unspecified: Secondary | ICD-10-CM

## 2013-10-23 DIAGNOSIS — E039 Hypothyroidism, unspecified: Secondary | ICD-10-CM

## 2013-10-23 DIAGNOSIS — I2510 Atherosclerotic heart disease of native coronary artery without angina pectoris: Secondary | ICD-10-CM

## 2013-10-23 DIAGNOSIS — I10 Essential (primary) hypertension: Secondary | ICD-10-CM

## 2013-10-23 LAB — HEPATIC FUNCTION PANEL
ALT: 22 U/L (ref 0–53)
AST: 18 U/L (ref 0–37)
Albumin: 3.9 g/dL (ref 3.5–5.2)
Alkaline Phosphatase: 44 U/L (ref 39–117)
Bilirubin, Direct: 0 mg/dL (ref 0.0–0.3)
Total Bilirubin: 0.6 mg/dL (ref 0.2–1.2)
Total Protein: 7.1 g/dL (ref 6.0–8.3)

## 2013-10-23 LAB — LIPID PANEL
Cholesterol: 162 mg/dL (ref 0–200)
HDL: 43.9 mg/dL (ref >39.00)
LDL Cholesterol: 89 mg/dL (ref 0–99)
NonHDL: 118.1
Total CHOL/HDL Ratio: 4
Triglycerides: 147 mg/dL (ref 0.0–149.0)
VLDL: 29.4 mg/dL (ref 0.0–40.0)

## 2013-10-23 LAB — BASIC METABOLIC PANEL
BUN: 16 mg/dL (ref 6–23)
CO2: 28 mEq/L (ref 19–32)
Calcium: 9.5 mg/dL (ref 8.4–10.5)
Chloride: 105 mEq/L (ref 96–112)
Creatinine, Ser: 1.2 mg/dL (ref 0.4–1.5)
GFR: 67.15 mL/min (ref >60.00)
Glucose, Bld: 138 mg/dL — ABNORMAL HIGH (ref 70–99)
Potassium: 4.5 mEq/L (ref 3.5–5.1)
Sodium: 138 mEq/L (ref 135–145)

## 2013-10-23 NOTE — Assessment & Plan Note (Signed)
Daniel Carey is doing well.  No significant angna

## 2013-10-23 NOTE — Progress Notes (Signed)
Daniel Carey Date of Birth  03-Feb-1951       [facility] [address], [place], [place]  [address]   [place], [place]  [address] [phone]     [phone]   Fax  [phone]    Fax [phone]  Problem List: 1. CAD - 2.5 x 38 mm Promus Premier DES in the distal RCA and a 2.75 x 38 mm Promus Premier DES in the mid vessel in an overlapping fashion. The distal sten was post-dilated with a 2.5 x 20 mm [place] balloon. The proximal portion of the stent was post-dilated with a 3.0 x 20 mm [place] balloon ( June 30, 2012)  2. Bradycardia after getting propafol for endoscopy.    History of Present Illness:  Daniel Carey is feeling well today.  He's been getting some exercise. He does yard work and walks on the treadmill occasionally. He had an episode of bradycardia after getting IV protocol for an endoscopy procedure. He was seen by [name]. His metoprolol dose was decreased. He has also had a bronchoscopy with [name]. He does not have any evidence of COPD.  Both he and his wife have stop smoking.  ( Aug 20, 2011)  Feb. 28, 2014:  He continues to have increasing chest pain.  He has not been smoking.  The pains are associated with exertion, last 5-10 minutes, resolve with 1 SL NTG.   He's had a mildly elevated TSH for the past year or so and has had some fatigue.    his TSH level 6.8.      His medical doctor started him on Synthroid. He gradually has titrated up to 0.1 mg a day. Since that time he's had increasing episodes of chest pain.  Aug 29, 2012:  [name] saw him in February we performed a cardiac catheterization because of increasing chest pain. He was found to have sequential stenosis in his mid right coronary artery and had PCI of his right coronary artery ( 2.5 x 38 mm Promus Premier DES in the distal RCA and a 2.75 x 38 mm Promus Premier DES in the mid vessel in an overlapping fashion. The distal sten was post-dilated  with a 2.5 x 20 mm [place] balloon. The proximal portion of the stent was post-dilated with a 3.0 x 20 mm [place] balloon)  He and his wife are concerned about whether or not the Plavix is active.  His generic plavix manufacturer has changed.   He would like to be tested.   October 14, 2012:  Daniel Carey has been having some occasional CP.  He has had some episodes of dyspnea while climbing up a hill playing golf.  This past Monday night, he had some chest pain - possible due to indigestions.  these  He had lost his NTG - he has refilled it now.    Nov. 10, 2014:  Daniel Carey is doing well.  He is working too much according to wife. he's had a few CP but not severe enough to take NTG.  Still has some dyspnea with climbing steps.   He had stenting in march 2014.  October 23, 2013:  Daniel Carey is doing well.   Playing golf.  Working in the yard.  Not exercising as much.   He has lost about 6 lbs.  He had labs drawn today.    Current Outpatient Prescriptions on File Prior to Visit  Medication Sig Dispense Refill  .  aspirin EC 81 MG tablet Take 81 mg by mouth daily.      Daniel Carey atorvastatin (LIPITOR) 80 MG tablet TAKE 1 TABLET DAILY  30 tablet  3  . Choline Fenofibrate 135 MG capsule Take 135 mg by mouth daily.      . clopidogrel (PLAVIX) 75 MG tablet TAKE 1 TABLET DAILY  30 tablet  3  . famotidine (PEPCID) 20 MG tablet Take 20 mg by mouth daily as needed for heartburn.      . hydrochlorothiazide (HYDRODIURIL) 25 MG tablet TAKE (1/2) TABLET DAILY.  15 tablet  0  . metoprolol (LOPRESSOR) 50 MG tablet TAKE (1/2) TABLET TWICE DAILY.  30 tablet  3  . naproxen sodium (ANAPROX) 220 MG tablet Take 220 mg by mouth daily as needed (headache).      . nitroGLYCERIN (NITROSTAT) 0.4 MG SL tablet Place 1 tablet (0.4 mg total) under the tongue every 5 (five) minutes as needed for chest pain.  25 tablet  3  . RABEprazole (ACIPHEX) 20 MG tablet Take 1 tablet (20 mg total) by mouth daily.  30 tablet  11   No current facility-administered  medications on file prior to visit.    Allergies  Allergen Reactions  . Lisinopril Cough  . Amlodipine Besylate Other (See Comments)    Headache, weakness, dizziness  . Propofol Other (See Comments)    Heart rate dropped  . Rosuvastatin Other (See Comments)     muscle aches    Past Medical History  Diagnosis Date  . CAD (coronary artery disease)     moderate to severe distal CAD with large epicardial arteries having only mild to moderate irregularities. Last cath in Dec 2006; s/p cath with Promus DES in distal RCA and Promus DES in the mid vessel in an overlapping fashion  . Chest pain   . HTN (hypertension)   . HLD (hyperlipidemia)   . GERD (gastroesophageal reflux disease)   . COPD (chronic obstructive pulmonary disease)   . Hiatal hernia   . External hemorrhoids without mention of complication   . Headache(784.0)   . Nephrolithiasis   . ED (erectile dysfunction)   . Personal history of colonic polyps 03/12/2010    TUBULAR ADENOMA  . Esophageal stricture   . Tobacco abuse   . AV block, Mobitz 1     noted on EKG March 2013    Past Surgical History  Procedure Laterality Date  . Angioplasty      X3  . Video bronchoscopy  08/13/2011    Procedure: VIDEO BRONCHOSCOPY WITHOUT FLUORO;  Surgeon: Tanda Rockers, MD;  Location: [facility];  Service: Cardiopulmonary;  Laterality: Bilateral;    History  Smoking status  . Former Smoker -- 0.50 packs/day for 40 years  . Types: Cigarettes  . Quit date: 08/25/2011  Smokeless tobacco  . Never Used    History  Alcohol Use  . 0.0 oz/week    Comment: rare    Family History  Problem Relation Age of Onset  . Heart disease Father     hx of MI  . Diabetes Mother     and brother   . Colon cancer Neg Hx   . Stomach cancer Neg Hx   . Breast cancer Sister     Reviw of Systems:  Reviewed in the HPI.  All other systems are negative.  Physical Exam: Blood pressure 130/84, pulse 66, height 5\' 10"  (1.778 m), weight 188 lb  (85.276 kg). General: Well developed, well nourished, in no  acute distress.  Head: Normocephalic, atraumatic, sclera non-icteric, mucus membranes are moist,   Neck: Supple. Carotids are 2 + without bruits. No JVD  Lungs: Clear bilaterally to auscultation.  Heart: regular rate.  normal  S1 S2. No murmurs, gallops or rubs.  Abdomen: Soft, non-tender, non-distended with normal bowel sounds. No hepatomegaly. No rebound/guarding. No masses.  Msk:  Strength and tone are normal  Extremities: No clubbing or cyanosis. No edema.  Distal pedal pulses are 2+ and equal bilaterally.  Neuro: Alert and oriented X 3. Moves all extremities spontaneously.  Psych:  Responds to questions appropriately with a normal affect.  ECG: October 23, 2013:    NSR at 75, 1st degree AVB\ Assessment / Plan:

## 2013-10-23 NOTE — Assessment & Plan Note (Signed)
Is a history of thyroid abnormalities. His medical doctor wanted him to start Synthroid but it made him feel poorly. We'll recheck a TSH today. He has requested a referral to an endocrinologist if his thyroid levels continued to be abnormal.

## 2013-10-23 NOTE — Patient Instructions (Signed)
Your physician recommends that you continue on your current medications as directed. Please refer to the Current Medication list given to you today.  Your physician wants you to follow-up in: 6 months with Dr. Nahser.  You will receive a reminder letter in the mail two months in advance. If you don't receive a letter, please call our office to schedule the follow-up appointment.  

## 2013-10-26 ENCOUNTER — Ambulatory Visit: Payer: BC Managed Care – PPO | Admitting: *Deleted

## 2013-10-27 LAB — TSH: TSH: 4.695 u[IU]/mL — ABNORMAL HIGH (ref 0.350–4.500)

## 2013-10-30 ENCOUNTER — Telehealth: Payer: Self-pay | Admitting: Nurse Practitioner

## 2013-10-30 DIAGNOSIS — R7989 Other specified abnormal findings of blood chemistry: Secondary | ICD-10-CM

## 2013-10-30 NOTE — Telephone Encounter (Signed)
Patient aware of lab results.  Ambulatory referral to endocrinology in epic; patient aware

## 2013-11-09 ENCOUNTER — Ambulatory Visit (INDEPENDENT_AMBULATORY_CARE_PROVIDER_SITE_OTHER): Payer: BC Managed Care – PPO | Admitting: Internal Medicine

## 2013-11-09 ENCOUNTER — Encounter: Payer: Self-pay | Admitting: Internal Medicine

## 2013-11-09 VITALS — BP 124/82 | HR 78 | Temp 98.2°F | Resp 12 | Ht 70.0 in | Wt 187.0 lb

## 2013-11-09 DIAGNOSIS — E039 Hypothyroidism, unspecified: Secondary | ICD-10-CM

## 2013-11-09 NOTE — Patient Instructions (Signed)
Please come back in 1 year for a visit but in 6 months for thyroid labs. You do not necessarily need Levothyroxine (Synthroid) for now.  Hypothyroidism The thyroid is a large gland located in the lower front of your neck. The thyroid gland helps control metabolism. Metabolism is how your body handles food. It controls metabolism with the hormone thyroxine. When this gland is underactive (hypothyroid), it produces too little hormone.  CAUSES These include:   Absence or destruction of thyroid tissue.  Goiter due to iodine deficiency.  Goiter due to medications.  Congenital defects (since birth).  Problems with the pituitary. This causes a lack of TSH (thyroid stimulating hormone). This hormone tells the thyroid to turn out more hormone. SYMPTOMS  Lethargy (feeling as though you have no energy)  Cold intolerance  Weight gain (in spite of normal food intake)  Dry skin  Coarse hair  Menstrual irregularity (if severe, may lead to infertility)  Slowing of thought processes Cardiac problems are also caused by insufficient amounts of thyroid hormone. Hypothyroidism in the newborn is cretinism, and is an extreme form. It is important that this form be treated adequately and immediately or it will lead rapidly to retarded physical and mental development. DIAGNOSIS  To prove hypothyroidism, your caregiver may do blood tests and ultrasound tests. Sometimes the signs are hidden. It may be necessary for your caregiver to watch this illness with blood tests either before or after diagnosis and treatment. TREATMENT  Low levels of thyroid hormone are increased by using synthetic thyroid hormone. This is a safe, effective treatment. It usually takes about four weeks to gain the full effects of the medication. After you have the full effect of the medication, it will generally take another four weeks for problems to leave. Your caregiver may start you on low doses. If you have had heart problems the  dose may be gradually increased. It is generally not an emergency to get rapidly to normal. HOME CARE INSTRUCTIONS   Take your medications as your caregiver suggests. Let your caregiver know of any medications you are taking or start taking. Your caregiver will help you with dosage schedules.  As your condition improves, your dosage needs may increase. It will be necessary to have continuing blood tests as suggested by your caregiver.  Report all suspected medication side effects to your caregiver. SEEK MEDICAL CARE IF: Seek medical care if you develop:  Sweating.  Tremulousness (tremors).  Anxiety.  Rapid weight loss.  Heat intolerance.  Emotional swings.  Diarrhea.  Weakness. SEEK IMMEDIATE MEDICAL CARE IF:  You develop chest pain, an irregular heart beat (palpitations), or a rapid heart beat. MAKE SURE YOU:   Understand these instructions.  Will watch your condition.  Will get help right away if you are not doing well or get worse. Document Released: 04/06/2005 Document Revised: 06/29/2011 Document Reviewed: 11/25/2007 [facility] Patient Information 2015 [place], [place]. This information is not intended to replace advice given to you by your health care provider. Make sure you discuss any questions you have with your health care provider.

## 2013-11-09 NOTE — Progress Notes (Signed)
Patient ID: Daniel Carey, male   DOB: 11-08-1950, 63 y.o.   MRN: [id number]   HPI  Daniel Carey is a 63 y.o.-year-old male, referred by his PCP, Dr. Edrick Carey, in consultation for hypothyroidism treatment. He is here with his wife.  Pt. has been dx with hypothyroidism in 2012; was on Levothyroxine starting low and increasing up to 200 mcg (!) last year >> did not feel well >> stopped.   He sees Dr Daniel Carey >> had angioplasy 3 times in the past, 2 stents in last year. He had an AMI 1996 (mild).   I reviewed pt's thyroid tests: Lab Results  Component Value Date   TSH 4.695* 10/26/2013   TSH 1.56 06/08/2012   TSH 6.83* 07/20/2011   TSH 5.61* 07/14/2011   TSH 5.53* 12/05/2010   TSH 3.07 02/13/2010    Pt denies feeling nodules in neck, hoarseness, dysphagia/odynophagia, SOB with lying down.  Pt describes: - no cold intolerance - no weight gain - + fatigue - off and on  - no constipation - no dry skin - no hair falling - no depression  She has + FH of thyroid disorders in: mother (goiter). No FH of thyroid cancer.  No h/o radiation tx to head or neck. Had (steroid ?) inj in shoulder less than 1 year ago No recent use of iodine supplements.  ROS: Constitutional: no weight gain/loss, + occasional  fatigue, no subjective hyperthermia/hypothermia Eyes: no blurry vision, no xerophthalmia ENT: no sore throat, no nodules palpated in throat, no dysphagia/odynophagia, no hoarseness Cardiovascular: no CP/SOB/palpitations/leg swelling Respiratory: no cough/SOB Gastrointestinal: no N/V/D/C/+ heartburn Musculoskeletal: no muscle/joint aches Skin: no rashes Neurological: no tremors/numbness/tingling/dizziness Psychiatric: no depression/anxiety  Past Medical History  Diagnosis Date  . CAD (coronary artery disease)     moderate to severe distal CAD with large epicardial arteries having only mild to moderate irregularities. Last cath in Dec 2006; s/p cath with Promus DES in distal RCA and Promus  DES in the mid vessel in an overlapping fashion  . Chest pain   . HTN (hypertension)   . HLD (hyperlipidemia)   . GERD (gastroesophageal reflux disease)   . COPD (chronic obstructive pulmonary disease)   . Hiatal hernia   . External hemorrhoids without mention of complication   . Headache(784.0)   . Nephrolithiasis   . ED (erectile dysfunction)   . Personal history of colonic polyps 03/12/2010    TUBULAR ADENOMA  . Esophageal stricture   . Tobacco abuse   . AV block, Mobitz 1     noted on EKG March 2013   Past Surgical History  Procedure Laterality Date  . Angioplasty      X3  . Video bronchoscopy  08/13/2011    Procedure: VIDEO BRONCHOSCOPY WITHOUT FLUORO;  Surgeon: Tanda Rockers, MD;  Location: [facility];  Service: Cardiopulmonary;  Laterality: Bilateral;   History   Social History  . Marital Status: Married    Spouse Name: N/A    Number of Children: , 61 y/o   Occupational History  . [employer] manager    Social History Main Topics  . Smoking status: Former Smoker -- 0.50 packs/day for 40 years    Types: Cigarettes    Quit date: 08/25/2011  . Smokeless tobacco: Never Used  . Alcohol Use: 0.0 oz/week     Comment: rare  . Drug Use: No   Current Outpatient Prescriptions on File Prior to Visit  Medication Sig Dispense Refill  . aspirin EC 81 MG  tablet Take 81 mg by mouth daily.      Marland Kitchen atorvastatin (LIPITOR) 80 MG tablet TAKE 1 TABLET DAILY  30 tablet  3  . Choline Fenofibrate 135 MG capsule Take 135 mg by mouth daily.      . clopidogrel (PLAVIX) 75 MG tablet TAKE 1 TABLET DAILY  30 tablet  3  . famotidine (PEPCID) 20 MG tablet Take 20 mg by mouth daily as needed for heartburn.      . hydrochlorothiazide (HYDRODIURIL) 25 MG tablet TAKE (1/2) TABLET DAILY.  15 tablet  0  . metoprolol (LOPRESSOR) 50 MG tablet TAKE (1/2) TABLET TWICE DAILY.  30 tablet  3  . RABEprazole (ACIPHEX) 20 MG tablet Take 1 tablet (20 mg total) by mouth daily.  30 tablet  11  . naproxen  sodium (ANAPROX) 220 MG tablet Take 220 mg by mouth daily as needed (headache).      . nitroGLYCERIN (NITROSTAT) 0.4 MG SL tablet Place 1 tablet (0.4 mg total) under the tongue every 5 (five) minutes as needed for chest pain.  25 tablet  3   No current facility-administered medications on file prior to visit.   Allergies  Allergen Reactions  . Lisinopril Cough  . Amlodipine Besylate Other (See Comments)    Headache, weakness, dizziness  . Propofol Other (See Comments)    Heart rate dropped  . Rosuvastatin Other (See Comments)     muscle aches   Family History  Problem Relation Age of Onset  . Heart disease Father     hx of MI  . Diabetes Mother     and brother   . Colon cancer Neg Hx   . Stomach cancer Neg Hx   . Breast cancer Sister    PE: BP 124/82  Pulse 78  Temp(Src) 98.2 F (36.8 C) (Oral)  Resp 12  Ht 5\' 10"  (1.778 m)  Wt 187 lb (84.823 kg)  BMI 26.83 kg/m2  SpO2 95% Wt Readings from Last 3 Encounters:  11/09/13 187 lb (84.823 kg)  10/23/13 188 lb (85.276 kg)  02/27/13 196 lb (88.905 kg)   Constitutional: slightly overweight, in NAD Eyes: PERRLA, EOMI, no exophthalmos ENT: moist mucous membranes, no thyromegaly, no cervical lymphadenopathy Cardiovascular: RRR, No MRG Respiratory: CTA B Gastrointestinal: abdomen soft, NT, ND, BS+ Musculoskeletal: no deformities, strength intact in all 4 Skin: moist, warm, no rashes Neurological: no tremor with outstretched hands, DTR normal in all 4  ASSESSMENT: 1. Mild ypothyroidism  PLAN:  1. Patient with few years h/o mild hypothyroidism, not on levothyroxine therapy. He appears euthyroid. - We discussed with him and his wife about the physiology of the pituitary-thyroid axis, the fact that he has mild hypothyroidism, which is not dangerous (I doubt his Mobitz 1 AVB is related to his very mild hypothyroidism), and he can only expect to see complications from it if TSH >10. He is very reticent to start unnecessary meds  and would like to avoid Levothyroxine due to previous experience with the med. I explained that he probably had an intolerance to Levothyroxine before 2/2 the high dose used. I suggested that we continue to check his TFTs every 6 mo and see him yearly. If TSH raises above 10 or he has sxs of hypothyroidism, we can start 25 mcg Levothyroxine. He is relieved and appears happy with this plan - She does not appear to have a goiter, thyroid nodules, or neck compression symptoms - we'll check thyroid tests in 6 mo: TSH, free T4, free  T3 - Return in about 1 year (around 11/10/2014).  - time spent with the patient: 45 min, of which >50% was spent in obtaining information about his symptoms, reviewing his previous labs, evaluations, and treatments, counseling him about his condition (please see the discussed topics above), and developing a plan to further investigate it. he had a number of questions which I addressed.

## 2013-11-16 ENCOUNTER — Other Ambulatory Visit: Payer: Self-pay | Admitting: Cardiovascular Disease

## 2014-01-29 ENCOUNTER — Encounter: Payer: Self-pay | Admitting: Gastroenterology

## 2014-02-02 ENCOUNTER — Other Ambulatory Visit: Payer: Self-pay

## 2014-02-03 ENCOUNTER — Other Ambulatory Visit (HOSPITAL_COMMUNITY): Payer: Self-pay | Admitting: Physician Assistant

## 2014-02-03 ENCOUNTER — Other Ambulatory Visit: Payer: Self-pay | Admitting: Gastroenterology

## 2014-02-03 ENCOUNTER — Other Ambulatory Visit: Payer: Self-pay | Admitting: Cardiovascular Disease

## 2014-02-05 ENCOUNTER — Ambulatory Visit: Payer: BC Managed Care – PPO | Admitting: Gastroenterology

## 2014-02-06 ENCOUNTER — Telehealth: Payer: Self-pay | Admitting: Gastroenterology

## 2014-02-06 MED ORDER — RABEPRAZOLE SODIUM 20 MG PO TBEC
20.0000 mg | DELAYED_RELEASE_TABLET | Freq: Every day | ORAL | Status: DC
Start: 2014-02-06 — End: 2014-03-29

## 2014-02-06 NOTE — Telephone Encounter (Signed)
Medication sent to [facility] in [place]

## 2014-03-05 ENCOUNTER — Encounter: Payer: Self-pay | Admitting: *Deleted

## 2014-03-05 ENCOUNTER — Other Ambulatory Visit: Payer: Self-pay | Admitting: *Deleted

## 2014-03-05 MED ORDER — ATORVASTATIN CALCIUM 80 MG PO TABS: ORAL_TABLET | ORAL | Status: DC

## 2014-03-29 ENCOUNTER — Ambulatory Visit (INDEPENDENT_AMBULATORY_CARE_PROVIDER_SITE_OTHER): Payer: BC Managed Care – PPO | Admitting: Gastroenterology

## 2014-03-29 ENCOUNTER — Encounter (HOSPITAL_COMMUNITY): Payer: Self-pay | Admitting: Cardiovascular Disease

## 2014-03-29 VITALS — BP 120/72 | HR 91 | Ht 70.0 in | Wt 188.8 lb

## 2014-03-29 DIAGNOSIS — D126 Benign neoplasm of colon, unspecified: Secondary | ICD-10-CM

## 2014-03-29 DIAGNOSIS — K219 Gastro-esophageal reflux disease without esophagitis: Secondary | ICD-10-CM

## 2014-03-29 MED ORDER — RABEPRAZOLE SODIUM 20 MG PO TBEC: 40.0000 mg | DELAYED_RELEASE_TABLET | Freq: Every day | ORAL | Status: DC

## 2014-03-29 NOTE — Assessment & Plan Note (Signed)
Artery disease is currently stable

## 2014-03-29 NOTE — Patient Instructions (Signed)
We are sending in your prescription to your pharmacy  Food Choices for Gastroesophageal Reflux Disease When you have gastroesophageal reflux disease (GERD), the foods you eat and your eating habits are very important. Choosing the right foods can help ease the discomfort of GERD. WHAT GENERAL GUIDELINES DO I NEED TO FOLLOW?  Choose fruits, vegetables, whole grains, low-fat dairy products, and low-fat meat, fish, and poultry.  Limit fats such as oils, salad dressings, butter, nuts, and avocado.  Keep a food diary to identify foods that cause symptoms.  Avoid foods that cause reflux. These may be different for different people.  Eat frequent small meals instead of three large meals each day.  Eat your meals slowly, in a relaxed setting.  Limit fried foods.  Cook foods using methods other than frying.  Avoid drinking alcohol.  Avoid drinking large amounts of liquids with your meals.  Avoid bending over or lying down until 2-3 hours after eating. WHAT FOODS ARE NOT RECOMMENDED? The following are some foods and drinks that may worsen your symptoms: Vegetables Tomatoes. Tomato juice. Tomato and spaghetti sauce. Chili peppers. Onion and garlic. Horseradish. Fruits Oranges, grapefruit, and lemon (fruit and juice). Meats High-fat meats, fish, and poultry. This includes hot dogs, ribs, ham, sausage, salami, and bacon. Dairy Whole milk and chocolate milk. Sour cream. Cream. Butter. Ice cream. Cream cheese.  Beverages Coffee and tea, with or without caffeine. Carbonated beverages or energy drinks. Condiments Hot sauce. Barbecue sauce.  Sweets/Desserts Chocolate and cocoa. Donuts. Peppermint and spearmint. Fats and Oils High-fat foods, including [place] fries and potato chips. Other Vinegar. Strong spices, such as black pepper, white pepper, red pepper, cayenne, curry powder, cloves, ginger, and chili powder. The items listed above may not be a complete list of foods and beverages to  avoid. Contact your dietitian for more information. Document Released: 04/06/2005 Document Revised: 04/11/2013 Document Reviewed: 02/08/2013 [facility] Patient Information 2015 [place], [place]. This information is not intended to replace advice given to you by your health care provider. Make sure you discuss any questions you have with your health care provider.    Gastroesophageal Reflux Disease, Adult Gastroesophageal reflux disease (GERD) happens when acid from your stomach flows up into the esophagus. When acid comes in contact with the esophagus, the acid causes soreness (inflammation) in the esophagus. Over time, GERD may create small holes (ulcers) in the lining of the esophagus. CAUSES   Increased body weight. This puts pressure on the stomach, making acid rise from the stomach into the esophagus.  Smoking. This increases acid production in the stomach.  Drinking alcohol. This causes decreased pressure in the lower esophageal sphincter (valve or ring of muscle between the esophagus and stomach), allowing acid from the stomach into the esophagus.  Late evening meals and a full stomach. This increases pressure and acid production in the stomach.  A malformed lower esophageal sphincter. Sometimes, no cause is found. SYMPTOMS   Burning pain in the lower part of the mid-chest behind the breastbone and in the mid-stomach area. This may occur twice a week or more often.  Trouble swallowing.  Sore throat.  Dry cough.  Asthma-like symptoms including chest tightness, shortness of breath, or wheezing. DIAGNOSIS  Your caregiver may be able to diagnose GERD based on your symptoms. In some cases, X-rays and other tests may be done to check for complications or to check the condition of your stomach and esophagus. TREATMENT  Your caregiver may recommend over-the-counter or prescription medicines to help decrease acid production.  Ask your caregiver before starting or adding any new medicines.   HOME CARE INSTRUCTIONS   Change the factors that you can control. Ask your caregiver for guidance concerning weight loss, quitting smoking, and alcohol consumption.  Avoid foods and drinks that make your symptoms worse, such as:  Caffeine or alcoholic drinks.  Chocolate.  Peppermint or mint flavorings.  Garlic and onions.  Spicy foods.  Citrus fruits, such as oranges, lemons, or limes.  Tomato-based foods such as sauce, chili, salsa, and pizza.  Fried and fatty foods.  Avoid lying down for the 3 hours prior to your bedtime or prior to taking a nap.  Eat small, frequent meals instead of large meals.  Wear loose-fitting clothing. Do not wear anything tight around your waist that causes pressure on your stomach.  Raise the head of your bed 6 to 8 inches with wood blocks to help you sleep. Extra pillows will not help.  Only take over-the-counter or prescription medicines for pain, discomfort, or fever as directed by your caregiver.  Do not take aspirin, ibuprofen, or other nonsteroidal anti-inflammatory drugs (NSAIDs). SEEK IMMEDIATE MEDICAL CARE IF:   You have pain in your arms, neck, jaw, teeth, or back.  Your pain increases or changes in intensity or duration.  You develop nausea, vomiting, or sweating (diaphoresis).  You develop shortness of breath, or you faint.  Your vomit is green, yellow, black, or looks like coffee grounds or blood.  Your stool is red, bloody, or black. These symptoms could be signs of other problems, such as heart disease, gastric bleeding, or esophageal bleeding. MAKE SURE YOU:   Understand these instructions.  Will watch your condition.  Will get help right away if you are not doing well or get worse. Document Released: 01/14/2005 Document Revised: 06/29/2011 Document Reviewed: 10/24/2010 [facility] Patient Information 2015 [place], [place]. This information is not intended to replace advice given to you by your health care provider. Make  sure you discuss any questions you have with your health care provider.

## 2014-03-29 NOTE — Progress Notes (Signed)
_                                                                                                                History of Present Illness:  Daniel Carey is a 63 year old white male former patient of Dr. Verl Blalock with history of coronary artery disease, on Plavix, GERD who is here for a medication refill.  On AcipHex 20 mg a day he claims that symptoms are not as well controlled as previously.  He's having frequent breakthrough pyrosis at any time during the day or night.  He denies dysphagia, coughing, nausea or hoarseness.  He also complains of abdominal bloating.  He has taken omeprazole and Nexium in the past which apparently were less effective than AcipHex.  Upper endoscopy in 2013 demonstrated moderate gastritis.  At colonoscopy in 2013 an adenomatous polyp was removed.   Past Medical History  Diagnosis Date  . CAD (coronary artery disease)     moderate to severe distal CAD with large epicardial arteries having only mild to moderate irregularities. Last cath in Dec 2006; s/p cath with Promus DES in distal RCA and Promus DES in the mid vessel in an overlapping fashion  . Chest pain   . HTN (hypertension)   . HLD (hyperlipidemia)   . GERD (gastroesophageal reflux disease)   . COPD (chronic obstructive pulmonary disease)   . Hiatal hernia   . External hemorrhoids without mention of complication   . Headache(784.0)   . Nephrolithiasis   . ED (erectile dysfunction)   . Personal history of colonic polyps 03/12/2010    TUBULAR ADENOMA  . Esophageal stricture   . Tobacco abuse   . AV block, Mobitz 1     noted on EKG March 2013   Past Surgical History  Procedure Laterality Date  . Angioplasty      X3  . Video bronchoscopy  08/13/2011    Procedure: VIDEO BRONCHOSCOPY WITHOUT FLUORO;  Surgeon: Tanda Rockers, MD;  Location: [facility];  Service: Cardiopulmonary;  Laterality: Bilateral;  . Percutaneous coronary rotoblator intervention (pci-r) N/A 06/30/2012   Procedure: PERCUTANEOUS CORONARY ROTOBLATOR INTERVENTION (PCI-R);  Surgeon: Burnell Blanks, MD;  Location: [facility] CATH [facility];  Service: Cardiovascular;  Laterality: N/A;   family history includes Breast cancer in his sister; Diabetes in his mother; Heart disease in his father. There is no history of Colon cancer or Stomach cancer. Current Outpatient Prescriptions  Medication Sig Dispense Refill  . aspirin EC 81 MG tablet Take 81 mg by mouth daily.    Marland Kitchen atorvastatin (LIPITOR) 80 MG tablet TAKE 1 TABLET DAILY 30 tablet 3  . Choline Fenofibrate 135 MG capsule Take 135 mg by mouth daily.    . clopidogrel (PLAVIX) 75 MG tablet TAKE 1 TABLET DAILY 30 tablet 11  . famotidine (PEPCID) 20 MG tablet Take 20 mg by mouth daily as needed for heartburn.    . hydrochlorothiazide (HYDRODIURIL) 25 MG tablet TAKE (1/2) TABLET DAILY. 15 tablet 5  . metoprolol (LOPRESSOR) 50 MG tablet  TAKE (1/2) TABLET TWICE DAILY. 30 tablet 11  . naproxen sodium (ANAPROX) 220 MG tablet Take 220 mg by mouth daily as needed (headache).    Marland Kitchen NITROSTAT 0.4 MG SL tablet 1 TABLET UNDER TONGUE EVERY 5 MINUTES UP TO 3 TIMES FOR CHEST PAIN THEN CALL DR IF NO RELIEF 25 tablet 5  . RABEprazole (ACIPHEX) 20 MG tablet Take 1 tablet (20 mg total) by mouth daily. 30 tablet 11   No current facility-administered medications for this visit.   Allergies as of 03/29/2014 - Review Complete 03/29/2014  Allergen Reaction Noted  . Lisinopril Cough and Rash 12/05/2010  . Amlodipine besylate Other (See Comments) 12/30/2006  . Propofol Other (See Comments) 06/28/2012  . Rosuvastatin Other (See Comments) 02/13/2010    reports that he quit smoking about 2 years ago. His smoking use included Cigarettes. He has a 20 pack-year smoking history. He has never used smokeless tobacco. He reports that he drinks alcohol. He reports that he does not use illicit drugs.   Review of Systems: Pertinent positive and negative review of systems were noted in the above  HPI section. All other review of systems were otherwise negative.  Vital signs were reviewed in today's medical record Physical Exam: General: Well developed , well nourished, no acute distress Skin: anicteric Head: Normocephalic and atraumatic Eyes:  sclerae anicteric, EOMI Ears: Normal auditory acuity Mouth: No deformity or lesions Neck: Supple, no masses or thyromegaly Lungs: Clear throughout to auscultation Heart: Regular rate and rhythm; no murmurs, rubs or bruits Abdomen: Soft, non tender and non distended. No masses, hepatosplenomegaly or hernias noted. Normal Bowel sounds Rectal:deferred Musculoskeletal: Symmetrical with no gross deformities  Skin: No lesions on visible extremities Pulses:  Normal pulses noted Extremities: No clubbing, cyanosis, edema or deformities noted Neurological: Alert oriented x 4, grossly nonfocal Cervical Nodes:  No significant cervical adenopathy Inguinal Nodes: No significant inguinal adenopathy Psychological:  Alert and cooperative. Normal mood and affect  See Assessment and Plan under Problem List

## 2014-03-29 NOTE — Assessment & Plan Note (Signed)
Plan followup colonoscopy 2018 

## 2014-03-29 NOTE — Assessment & Plan Note (Signed)
Patient is symptomatic despite daily AcipHex.  He feels that Prevacid Sinutabs work best but this medicine is not covered by his insurance.  Plan to increase from 20-40 mg daily.  If not improved I would consider another PPI.

## 2014-04-26 ENCOUNTER — Ambulatory Visit (INDEPENDENT_AMBULATORY_CARE_PROVIDER_SITE_OTHER): Payer: BC Managed Care – PPO | Admitting: Cardiovascular Disease

## 2014-04-26 ENCOUNTER — Encounter: Payer: Self-pay | Admitting: Cardiovascular Disease

## 2014-04-26 VITALS — BP 142/92 | HR 65 | Ht 70.0 in | Wt 189.4 lb

## 2014-04-26 DIAGNOSIS — G4730 Sleep apnea, unspecified: Secondary | ICD-10-CM

## 2014-04-26 DIAGNOSIS — I2510 Atherosclerotic heart disease of native coronary artery without angina pectoris: Secondary | ICD-10-CM

## 2014-04-26 DIAGNOSIS — G4733 Obstructive sleep apnea (adult) (pediatric): Secondary | ICD-10-CM | POA: Insufficient documentation

## 2014-04-26 MED ORDER — NITROGLYCERIN 0.4 MG SL SUBL: 0.4000 mg | SUBLINGUAL_TABLET | SUBLINGUAL | Status: DC | PRN

## 2014-04-26 MED ORDER — CLOPIDOGREL BISULFATE 75 MG PO TABS: 75.0000 mg | ORAL_TABLET | Freq: Every day | ORAL | Status: DC

## 2014-04-26 MED ORDER — ATORVASTATIN CALCIUM 80 MG PO TABS: ORAL_TABLET | ORAL | Status: DC

## 2014-04-26 MED ORDER — METOPROLOL TARTRATE 25 MG PO TABS: 25.0000 mg | ORAL_TABLET | Freq: Two times a day (BID) | ORAL | Status: DC

## 2014-04-26 MED ORDER — CHOLINE FENOFIBRATE 135 MG PO CPDR: 135.0000 mg | DELAYED_RELEASE_CAPSULE | Freq: Every day | ORAL | Status: DC

## 2014-04-26 MED ORDER — HYDROCHLOROTHIAZIDE 25 MG PO TABS: 12.5000 mg | ORAL_TABLET | Freq: Every day | ORAL | Status: DC

## 2014-04-26 NOTE — Patient Instructions (Addendum)
Your physician recommends that you continue on your current medications as directed. Please refer to the Current Medication list given to you today.  Your physician recommends that you return for lab work today. (BMET, Lipid Panel, Hepatic Panel)  Your physician has recommended that you have a sleep study. This test records several body functions during sleep, including: brain activity, eye movement, oxygen and carbon dioxide blood levels, heart rate and rhythm, breathing rate and rhythm, the flow of air through your mouth and nose, snoring, body muscle movements, and chest and belly movement.  Your physician wants you to follow-up in: 6 months with Dr. Acie Fredrickson. You will receive a reminder letter in the mail two months in advance. If you don't receive a letter, please call our office to schedule the follow-up appointment.

## 2014-04-26 NOTE — Progress Notes (Addendum)
Daniel Carey Date of Birth  June 25, 1950       [facility] [address], [address], [place], [place]  [address]   [place], [place]  [address] [phone]     [phone]   Fax  [phone]    Fax [phone]  Problem List: 1. CAD - 2.5 x 38 mm Promus Premier DES in the distal RCA and a 2.75 x 38 mm Promus Premier DES in the mid vessel in an overlapping fashion. The distal sten was post-dilated with a 2.5 x 20 mm [place] balloon. The proximal portion of the stent was post-dilated with a 3.0 x 20 mm [place] balloon ( June 30, 2012)  2. Bradycardia after getting propafol for endoscopy.    History of Present Illness:  Daniel Carey is feeling well today.  He's been getting some exercise. He does yard work and walks on the treadmill occasionally. He had an episode of bradycardia after getting IV protocol for an endoscopy procedure. He was seen by Daniel Carey. His metoprolol dose was decreased. He has also had a bronchoscopy with [name]. He does not have any evidence of COPD.  Both he and his wife have stop smoking.  ( Aug 20, 2011)  Feb. 28, 2014:  He continues to have increasing chest pain.  He has not been smoking.  The pains are associated with exertion, last 5-10 minutes, resolve with 1 SL NTG.   He's had a mildly elevated TSH for the past year or so and has had some fatigue.    his TSH level 6.8.      His medical doctor started him on Synthroid. He gradually has titrated up to 0.1 mg a day. Since that time he's had increasing episodes of chest pain.  Aug 29, 2012:  Daniel Carey saw him in February we performed a cardiac catheterization because of increasing chest pain. He was found to have sequential stenosis in his mid right coronary artery and had PCI of his right coronary artery ( 2.5 x 38 mm Promus Premier DES in the distal RCA and a 2.75 x 38 mm Promus Premier DES in the mid vessel in an overlapping fashion. The distal sten was post-dilated  with a 2.5 x 20 mm [place] balloon. The proximal portion of the stent was post-dilated with a 3.0 x 20 mm  balloon)  He and his wife are concerned about whether or not the Plavix is active.  His generic plavix manufacturer has changed.   He would like to be tested.   October 14, 2012:  Daniel Carey has been having some occasional CP.  He has had some episodes of dyspnea while climbing up a hill playing golf.  This past Monday night, he had some chest pain - possible due to indigestions.  these  He had lost his NTG - he has refilled it now.    Nov. 10, 2014:  Daniel Carey is doing well.  He is working too much according to wife. he's had a few CP but not severe enough to take NTG.  Still has some dyspnea with climbing steps.   He had stenting in march 2014.  October 23, 2013:  Daniel Carey is doing well.   Playing golf.  Working in the yard.  Not exercising as much.   He has lost about 6 lbs.  He had labs drawn today.     Jan. 7, 2016:  He has been waking up in the middle  of the night and feels like he cant catch his  Breath. Still working out - on the treadmill twice this week and has done well.   Current Outpatient Prescriptions on File Prior to Visit  Medication Sig Dispense Refill  . aspirin EC 81 MG tablet Take 81 mg by mouth daily.    Marland Kitchen atorvastatin (LIPITOR) 80 MG tablet TAKE 1 TABLET DAILY 30 tablet 3  . Choline Fenofibrate 135 MG capsule Take 135 mg by mouth daily.    . clopidogrel (PLAVIX) 75 MG tablet TAKE 1 TABLET DAILY 30 tablet 11  . famotidine (PEPCID) 20 MG tablet Take 20 mg by mouth daily as needed for heartburn.    . hydrochlorothiazide (HYDRODIURIL) 25 MG tablet TAKE (1/2) TABLET DAILY. 15 tablet 5  . metoprolol (LOPRESSOR) 50 MG tablet TAKE (1/2) TABLET TWICE DAILY. 30 tablet 11  . naproxen sodium (ANAPROX) 220 MG tablet Take 220 mg by mouth daily as needed (headache).    Marland Kitchen NITROSTAT 0.4 MG SL tablet 1 TABLET UNDER TONGUE EVERY 5 MINUTES UP TO 3 TIMES FOR CHEST PAIN THEN CALL DR IF NO RELIEF  25 tablet 5  . RABEprazole (ACIPHEX) 20 MG tablet Take 2 tablets (40 mg total) by mouth daily. 60 tablet 11   No current facility-administered medications on file prior to visit.    Allergies  Allergen Reactions  . Lisinopril Cough and Rash  . Amlodipine Besylate Other (See Comments)    Headache, weakness, dizziness  . Propofol Other (See Comments)    Heart rate dropped  . Rosuvastatin Other (See Comments)     muscle aches    Past Medical History  Diagnosis Date  . CAD (coronary artery disease)     moderate to severe distal CAD with large epicardial arteries having only mild to moderate irregularities. Last cath in Dec 2006; s/p cath with Promus DES in distal RCA and Promus DES in the mid vessel in an overlapping fashion  . Chest pain   . HTN (hypertension)   . HLD (hyperlipidemia)   . GERD (gastroesophageal reflux disease)   . COPD (chronic obstructive pulmonary disease)   . Hiatal hernia   . External hemorrhoids without mention of complication   . Headache(784.0)   . Nephrolithiasis   . ED (erectile dysfunction)   . Personal history of colonic polyps 03/12/2010    TUBULAR ADENOMA  . Esophageal stricture   . Tobacco abuse   . AV block, Mobitz 1     noted on EKG March 2013    Past Surgical History  Procedure Laterality Date  . Angioplasty      X3  . Video bronchoscopy  08/13/2011    Procedure: VIDEO BRONCHOSCOPY WITHOUT FLUORO;  Surgeon: Tanda Rockers, MD;  Location: [facility];  Service: Cardiopulmonary;  Laterality: Bilateral;  . Percutaneous coronary rotoblator intervention (pci-r) N/A 06/30/2012    Procedure: PERCUTANEOUS CORONARY ROTOBLATOR INTERVENTION (PCI-R);  Surgeon: Burnell Blanks, MD;  Location: [facility];  Service: Cardiovascular;  Laterality: N/A;    History  Smoking status  . Former Smoker -- 0.50 packs/day for 40 years  . Types: Cigarettes  . Quit date: 08/25/2011  Smokeless tobacco  . Never Used    History  Alcohol Use  . 0.0  oz/week    Comment: rare    Family History  Problem Relation Age of Onset  . Heart disease Father     hx of MI  . Diabetes Mother     and brother   .  Colon cancer Neg Hx   . Stomach cancer Neg Hx   . Breast cancer Sister     Reviw of Systems:  Reviewed in the HPI.  All other systems are negative.  Physical Exam: Blood pressure 142/92, pulse 65, height 5\' 10"  (1.778 m), weight 189 lb 6.4 oz (85.911 kg). General: Well developed, well nourished, in no acute distress.  Head: Normocephalic, atraumatic, sclera non-icteric, mucus membranes are moist,   Neck: Supple. Carotids are 2 + without bruits. No JVD  Lungs: Clear bilaterally to auscultation.  Heart: regular rate.  normal  S1 S2. No murmurs, gallops or rubs.  Abdomen: Soft, non-tender, non-distended with normal bowel sounds. No hepatomegaly. No rebound/guarding. No masses.  Msk:  Strength and tone are normal  Extremities: No clubbing or cyanosis. No edema.  Distal pedal pulses are 2+ and equal bilaterally.  Neuro: Alert and oriented X 3. Moves all extremities spontaneously.  Psych:  Responds to questions appropriately with a normal affect.  ECG: Jan. 7, 2015:  NSR aith 1st degree AV block    NSR at 66, 1st degree AVB\ Assessment / Plan:

## 2014-04-26 NOTE — Assessment & Plan Note (Signed)
He is doing well.  No angina.  Continue current meds 

## 2014-04-26 NOTE — Assessment & Plan Note (Signed)
He has symptoms of obstructive sleep apnea. He wakes up frequently through the night and "snorts" himself awake . He  Has daytime sleepiness but does not fall asleep during the day.   Will set him up for sleep study.   Will see him in 6 months. Sooner if needed.

## 2014-04-27 ENCOUNTER — Ambulatory Visit: Payer: BC Managed Care – PPO | Admitting: Cardiovascular Disease

## 2014-04-27 LAB — BASIC METABOLIC PANEL
BUN: 22 mg/dL (ref 6–23)
CO2: 30 mEq/L (ref 19–32)
Calcium: 9.7 mg/dL (ref 8.4–10.5)
Chloride: 103 mEq/L (ref 96–112)
Creatinine, Ser: 1.2 mg/dL (ref 0.4–1.5)
GFR: 68.38 mL/min (ref >60.00)
Glucose, Bld: 98 mg/dL (ref 70–99)
Potassium: 5 mEq/L (ref 3.5–5.1)
Sodium: 139 mEq/L (ref 135–145)

## 2014-04-27 LAB — HEPATIC FUNCTION PANEL
ALT: 21 U/L (ref 0–53)
AST: 19 U/L (ref 0–37)
Albumin: 4.2 g/dL (ref 3.5–5.2)
Alkaline Phosphatase: 53 U/L (ref 39–117)
Bilirubin, Direct: 0.1 mg/dL (ref 0.0–0.3)
Total Bilirubin: 0.6 mg/dL (ref 0.2–1.2)
Total Protein: 7.4 g/dL (ref 6.0–8.3)

## 2014-04-27 LAB — LIPID PANEL
Cholesterol: 162 mg/dL (ref 0–200)
HDL: 39.6 mg/dL (ref >39.00)
LDL Cholesterol: 92 mg/dL (ref 0–99)
NonHDL: 122.4
Total CHOL/HDL Ratio: 4
Triglycerides: 151 mg/dL — ABNORMAL HIGH (ref 0.0–149.0)
VLDL: 30.2 mg/dL (ref 0.0–40.0)

## 2014-05-14 ENCOUNTER — Ambulatory Visit: Payer: BC Managed Care – PPO | Admitting: Internal Medicine

## 2014-05-28 ENCOUNTER — Ambulatory Visit: Payer: BC Managed Care – PPO | Admitting: Internal Medicine

## 2014-06-26 ENCOUNTER — Ambulatory Visit: Payer: BC Managed Care – PPO | Admitting: Internal Medicine

## 2014-07-08 ENCOUNTER — Ambulatory Visit (HOSPITAL_BASED_OUTPATIENT_CLINIC_OR_DEPARTMENT_OTHER): Payer: BC Managed Care – PPO | Attending: Cardiovascular Disease

## 2014-07-08 VITALS — Ht 70.0 in | Wt 192.0 lb

## 2014-07-08 DIAGNOSIS — G4730 Sleep apnea, unspecified: Secondary | ICD-10-CM

## 2014-07-09 ENCOUNTER — Telehealth: Payer: Self-pay | Admitting: Cardiology

## 2014-07-09 DIAGNOSIS — G4733 Obstructive sleep apnea (adult) (pediatric): Secondary | ICD-10-CM

## 2014-07-09 NOTE — Telephone Encounter (Signed)
Please let patient know that she has severe OSA and had successful CPAP titration.  Set up for OV with me in 10 weeks.  Please let [facility] know that order is in [facility]

## 2014-07-09 NOTE — Sleep Study (Signed)
NAME: Daniel Carey DATE OF BIRTH:  01-22-1951 MEDICAL RECORD NUMBER [id number]  LOCATION: [facility]  PHYSICIAN: TURNER,TRACI R  DATE OF STUDY: 07/08/2014  SLEEP STUDY TYPE: Nocturnal Polysomnogram               REFERRING PHYSICIAN: Nahser, Wonda Cheng, MD  INDICATION FOR STUDY: snoring, excessive daytime fatigue  EPWORTH SLEEPINESS SCORE: 5 HEIGHT: 5\' 10"  (177.8 cm)  WEIGHT: 192 lb (87.091 kg)    Body mass index is 27.55 kg/(m^2).  NECK SIZE: 15 in.  MEDICATIONS: Reviewed in the chart  SLEEP ARCHITECTURE: During the diagnostic portion of the study, the patient slept for a total of 119 minutes out of a total sleep period of 156 minutes.  There was no slow wave or REM sleep noted.  The onset to sleep latency was prolonged at 51 minutes.  The sleep efficiency was reduced at 57.7%.  There were an increased number of arousals due to respiratory events.  During the CPAP titration there was a total of 171 minutes of sleep out of a total sleep period of 196 minutes.  There was no slow wave sleep and 29 minutes of REM sleep.  The onset to REM sleep latency was normal at 96%.  The sleep efficiency was normal at 85%.    RESPIRATORY DATA: During the diagnostic portion of the study, there were 55 apneas, of which, 49 were obstructive, 4 were central and 2 were mixed apneas.  There were 46 hypopneas.  Most events occurred in the supine position in NREM sleep.  The AHI was 51 events per hour consistent with severe obstructive sleep apnea/hypopnea syndrome.  There was severe snoring noted.  The patient was started on CPAP at 4cm H2O and titrated for respiratory events and snoring to 14cm H2O.  The patient was able to reach an optimum pressure of 10cm H2O and maintain REM supine sleep for a prolonged period of time without further significant respiratory events.  The AHI at 10cm H2O was 6 events per hour.  Snoring was abolished during the CPAP titration.    OXYGEN DATA: During the  diagnostic portion of the study, the lowest oxygen saturation was 89% and the average oxygen saturation was 93%.  During the CPAP titration, the lowest oxygen saturation was 90% and the average heart rate was 93%.    CARDIAC DATA: The patient maintained NSR during the study.  The average heart rate was 60 bpm.  The highest heart rate was 131 bpm and the lowest heart rate was 31bpm.    MOVEMENT/PARASOMNIA: There were occasional periodic limb movements during the diagnostic portion of the study with a PLMS index of 9.5 movements per hour.  Leg movements were abolished with CPAP titration.  There were no REM sleep behavior disorders noted.  IMPRESSION/ RECOMMENDATION:   1.  Severe obstructive sleep apnea/hyopnea syndrome with an AHI of 51 events per hour.   Most events occurred in the supine position in NREM sleep. 2.  Reduced sleep efficiency with increased frequency of arousals due to respiratory events.  3.  Abnormal sleep architecture with no slow wave or REM sleep during the diagnostic study. 4.  Increased periodic limb movements that were abolished with CPAP therapy. 5.  Severe snoring was noted that was abolished with CPAP therapy. 6.  The patient maintained NSR throughout the study. 7.  Successful CPAP titration to 10cm H2O 8.  Recommend ResMed CPAP at 10cm H2O with heated humidifier and medium [employer]  Simplus full face mask 9. The patient should be counseled on good sleep hygiene. 10.  The patient should be counseled to avoid sleeping supine.  Signed: Sueanne Margarita Diplomate, American Board of Sleep Medicine  ELECTRONICALLY SIGNED ON:  07/09/2014, 9:52 PM [place] PH: [phone]   FX: [phone] [place]

## 2014-07-11 ENCOUNTER — Other Ambulatory Visit: Payer: Self-pay | Admitting: Cardiovascular Disease

## 2014-07-12 ENCOUNTER — Telehealth: Payer: Self-pay | Admitting: *Deleted

## 2014-07-12 NOTE — Telephone Encounter (Signed)
Pt. Is aware of results and that [facility] will be contacting him about CPAP.  OV was made for 6/10 at 10:15am Message has been sent to [facility] about orders.

## 2014-07-12 NOTE — Telephone Encounter (Signed)
Pt. Returned call about Sleep study. See additional phone note from [place]

## 2014-08-02 ENCOUNTER — Ambulatory Visit (INDEPENDENT_AMBULATORY_CARE_PROVIDER_SITE_OTHER): Payer: BC Managed Care – PPO | Admitting: Internal Medicine

## 2014-08-02 ENCOUNTER — Encounter: Payer: Self-pay | Admitting: Internal Medicine

## 2014-08-02 VITALS — BP 114/62 | HR 70 | Temp 97.9°F | Resp 12 | Wt 188.6 lb

## 2014-08-02 DIAGNOSIS — Z833 Family history of diabetes mellitus: Secondary | ICD-10-CM

## 2014-08-02 DIAGNOSIS — E039 Hypothyroidism, unspecified: Secondary | ICD-10-CM | POA: Diagnosis not present

## 2014-08-02 NOTE — Patient Instructions (Signed)
Please stop at the lab.  Please come back for a follow-up appointment in 6 months.  

## 2014-08-02 NOTE — Progress Notes (Signed)
Patient ID: Daniel Carey, male   DOB: 1950-07-15, 64 y.o.   MRN: [id number]   HPI  Daniel Carey is a 64 y.o.-year-oldar-old male, returning for f/u for hypothyroidism. Last visit 9 mo ago. He is here with his wife.  Reviewed and addended hx: Pt. has been dx with hypothyroidism in 2012; was on Levothyroxine starting low and increasing up to 200 mcg (!) last year >> did not feel well >> stopped.   At last visit, we checked a TSH and this was mildly elevated >> I d/w him and suggested to stay off LT4 and recheck in 6 mo.  I reviewed pt's thyroid tests: Lab Results  Component Value Date   TSH 4.695* 10/26/2013   TSH 1.56 06/08/2012   TSH 6.83* 07/20/2011   TSH 5.61* 07/14/2011   TSH 5.53* 12/05/2010   TSH 3.07 02/13/2010    Pt denies feeling nodules in neck, hoarseness, dysphagia/odynophagia, SOB with lying down.  Pt describes: - no cold intolerance - no weight gain - + fatigue - off and on  - no constipation - no dry skin - no hair falling - no depression  He sees Dr Acie Fredrickson >> had angioplasy 3 times in the past, 2 stents in last year. He had an AMI 1996 (mild). He has a Mobitz 1 AVB.  He is concerned about having diabetes as he has this in his family: brother, mother. No increased thirst or urination.   ROS: Constitutional: no weight gain/loss, + occasional  fatigue, no subjective hyperthermia/hypothermia Eyes: no blurry vision, no xerophthalmia ENT: no sore throat, no nodules palpated in throat, no dysphagia/odynophagia, no hoarseness Cardiovascular: no CP/SOB/palpitations/leg swelling Respiratory: no cough/SOB Gastrointestinal: no N/V/D/C/+ heartburn Musculoskeletal: no muscle/joint aches Skin: no rashes Neurological: no tremors/numbness/tingling/dizziness Psychiatric: no depression/anxiety  Past Medical History  Diagnosis Date  . CAD (coronary artery disease)     moderate to severe distal CAD with large epicardial arteries having only mild to moderate irregularities.  Last cath in Dec 2006; s/p cath with Promus DES in distal RCA and Promus DES in the mid vessel in an overlapping fashion  . Chest pain   . HTN (hypertension)   . HLD (hyperlipidemia)   . GERD (gastroesophageal reflux disease)   . COPD (chronic obstructive pulmonary disease)   . Hiatal hernia   . External hemorrhoids without mention of complication   . Headache(784.0)   . Nephrolithiasis   . ED (erectile dysfunction)   . Personal history of colonic polyps 03/12/2010    TUBULAR ADENOMA  . Esophageal stricture   . Tobacco abuse   . AV block, Mobitz 1     noted on EKG March 2013   Past Surgical History  Procedure Laterality Date  . Angioplasty      X3  . Video bronchoscopy  08/13/2011    Procedure: VIDEO BRONCHOSCOPY WITHOUT FLUORO;  Surgeon: Tanda Rockers, MD;  Location: [facility];  Service: Cardiopulmonary;  Laterality: Bilateral;  . Percutaneous coronary rotoblator intervention (pci-r) N/A 06/30/2012    Procedure: PERCUTANEOUS CORONARY ROTOBLATOR INTERVENTION (PCI-R);  Surgeon: Burnell Blanks, MD;  Location: [facility] CATH LAB;  Service: Cardiovascular;  Laterality: N/A;   History   Social History  . 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 Smoking status: Former Smoker -- 0.50 packs/day for 40 years    Types:  Cigarettes    Quit date: 08/25/2011  . Smokeless tobacco: Never Used  . Alcohol Use: 0.0 oz/week     Comment: rare  . Drug Use: No   Current Outpatient Prescriptions on File Prior to Visit  Medication Sig Dispense Refill  . aspirin EC 81 MG tablet Take 81 mg by mouth daily.    Marland Kitchen atorvastatin (LIPITOR) 80 MG tablet TAKE 1 TABLET DAILY 90 tablet 3  . Choline Fenofibrate 135 MG capsule Take 1 capsule (135 mg total) by mouth daily. 90 capsule 3  . clopidogrel (PLAVIX) 75 MG tablet Take 1 tablet (75 mg total) by mouth daily. 90 tablet 3  .  hydrochlorothiazide (HYDRODIURIL) 25 MG tablet Take 0.5 tablets (12.5 mg total) by mouth daily. 45 tablet 3  . metoprolol (LOPRESSOR) 25 MG tablet Take 1 tablet (25 mg total) by mouth 2 (two) times daily. 180 tablet 3  . famotidine (PEPCID) 20 MG tablet Take 20 mg by mouth daily as needed for heartburn.    . naproxen sodium (ANAPROX) 220 MG tablet Take 220 mg by mouth daily as needed (headache).    . nitroGLYCERIN (NITROSTAT) 0.4 MG SL tablet Place 1 tablet (0.4 mg total) under the tongue every 5 (five) minutes as needed for chest pain. (Patient not taking: Reported on 08/02/2014) 25 tablet 6  . RABEprazole (ACIPHEX) 20 MG tablet Take 2 tablets (40 mg total) by mouth daily. (Patient not taking: Reported on 08/02/2014) 60 tablet 11   No current facility-administered medications on file prior to visit.   Allergies  Allergen Reactions  . Lisinopril Cough and Rash  . Amlodipine Besylate Other (See Comments)    Headache, weakness, dizziness  . Propofol Other (See Comments)    Heart rate dropped  . Rosuvastatin Other (See Comments)     muscle aches   Family History  Problem Relation Age of Onset  . Heart disease Father     hx of MI  . Diabetes Mother     and brother   . Colon cancer Neg Hx   . Stomach cancer Neg Hx   . Breast cancer Sister    PE: BP 114/62 mmHg  Pulse 70  Temp(Src) 97.9 F (36.6 C) (Oral)  Resp 12  Wt 188 lb 9.6 oz (85.548 kg)  SpO2 97% Wt Readings from Last 3 Encounters:  08/02/14 188 lb 9.6 oz (85.548 kg)  07/08/14 192 lb (87.091 kg)  04/26/14 189 lb 6.4 oz (85.911 kg)   Constitutional: slightly overweight, in NAD Eyes: PERRLA, EOMI, no exophthalmos ENT: moist mucous membranes, no thyromegaly, no cervical lymphadenopathy Cardiovascular: RRR, No MRG Respiratory: CTA B Gastrointestinal: abdomen soft, NT, ND, BS+ Musculoskeletal: no deformities, strength intact in all 4 Skin: moist, warm, no rashes Neurological: no tremor with outstretched hands, DTR normal  in all 4  ASSESSMENT: 1. Mild hypothyroidism  2. Family history of diabetes  PLAN:  1. Patient with few years h/o mild hypothyroidism, not on levothyroxine therapy. He appears euthyroid. - We discussed with him and his wife about the fact that he has mild hypothyroidism, which is not dangerous, and he can only expect to see complications from it if TSH >10. He is very reticent to start unnecessary meds and would like to avoid Levothyroxine due to previous experience with the med. (He probably had an intolerance to Levothyroxine before 2/2 the high dose used). I again suggested that we continue to check his TFTs every 6 mo and see him yearly. If TSH raises above 10  or he has sxs of hypothyroidism, we can start 25 mcg Levothyroxine.  - She does not appear to have a goiter, thyroid nodules, or neck compression symptoms - will check thyroid tests today: TSH, free T4, free T3 - Return in about 6 months (around 02/01/2015).   2. Family history of diabetes - Patient has a family history of diabetes, and is wondering whether he can get tested for this. He does not complain of symptoms that are consistent with diabetes: No blurry vision, increased thirst, increased urination. He has a little bit of weight loss (however weight is the same as last visit), which has been intentional. - He tells me that he has been told in the past that he might be borderline diabetic - He is reticent to start on any medication if not absolutely necessary - I will check her HbA1c today, and if elevated, I advised him that we might need to schedule an appointment to discuss about it. He agrees with this.   Office Visit on 08/02/2014  Component Date Value Ref Range Status  . TSH 08/02/2014 2.49  0.35 - 4.50 uIU/mL Final  . Free T4 08/02/2014 0.79  0.60 - 1.60 ng/dL Final  . T3, Free 08/02/2014 2.9  2.3 - 4.2 pg/mL Final  . Hgb A1c MFr Bld 08/02/2014 6.7* 4.6 - 6.5 % Final   Glycemic Control Guidelines for People with  Diabetes:Non Diabetic:  <6%Goal of Therapy: <7%Additional Action Suggested:  >8%    TFTs normal. Hemoglobin A1c in the diabetic range. I advised the patient to return in the next month to discuss about this. Reviewing his chart, he has had another hemoglobin A1c of 6.7%, 3 years ago. With 2 levels higher than 6.5%, he does have a diagnosis of diabetes. I advised him to check sugars at home if he has a glucometer, however, if not, we will give him one at next visit.

## 2014-08-03 LAB — TSH: TSH: 2.49 u[IU]/mL (ref 0.35–4.50)

## 2014-08-03 LAB — T3, FREE: T3, Free: 2.9 pg/mL (ref 2.3–4.2)

## 2014-08-03 LAB — T4, FREE: Free T4: 0.79 ng/dL (ref 0.60–1.60)

## 2014-08-03 LAB — HEMOGLOBIN A1C: Hgb A1c MFr Bld: 6.7 % — ABNORMAL HIGH (ref 4.6–6.5)

## 2014-08-31 ENCOUNTER — Encounter: Payer: Self-pay | Admitting: Cardiology

## 2014-09-10 ENCOUNTER — Ambulatory Visit: Payer: BC Managed Care – PPO | Admitting: Internal Medicine

## 2014-09-28 ENCOUNTER — Ambulatory Visit: Payer: BC Managed Care – PPO | Admitting: Cardiology

## 2014-11-02 ENCOUNTER — Ambulatory Visit (INDEPENDENT_AMBULATORY_CARE_PROVIDER_SITE_OTHER): Payer: BC Managed Care – PPO | Admitting: Cardiology

## 2014-11-02 ENCOUNTER — Encounter: Payer: Self-pay | Admitting: Cardiology

## 2014-11-02 VITALS — BP 120/70 | HR 69 | Ht 70.5 in | Wt 187.4 lb

## 2014-11-02 DIAGNOSIS — I10 Essential (primary) hypertension: Secondary | ICD-10-CM

## 2014-11-02 DIAGNOSIS — G4733 Obstructive sleep apnea (adult) (pediatric): Secondary | ICD-10-CM | POA: Diagnosis not present

## 2014-11-02 NOTE — Patient Instructions (Signed)

## 2014-11-02 NOTE — Progress Notes (Signed)
Cardiology Office Note   Date:  11/02/2014   ID:  [name], DOB Apr 22, 1950, MRN [id number]  PCP:  Sherrie Mustache, MD    Chief Complaint  Patient presents with  . Sleep Apnea  . Hypertension      History of Present Illness: Daniel Carey is a 64 y.o. male who presents for evaluation of sleep apnea.  She was complaining of snoring and excessive daytime fatigue but with an Epworth sleepiness scale of only 5.   She underwent PSG showing severe OSA with an AHI of 51 events per hour, most occurring in the supine position in NREM sleep.  Her oxygen saturations dropped to 89% with respiratory events.  She underwent CPAP titration to 10cm H2O.  She now presents for followup.  She is doing well with her CPAP.  She tolerates the full face mask and feels the pressure is adequate.  Since starting the CPAP, she feels more rested when she wakes up and has less daytime sleepiness.  Her snoring has resolved. She denies any nasal congestion or mouth dryness.    Past Medical History  Diagnosis Date  . CAD (coronary artery disease)     moderate to severe distal CAD with large epicardial arteries having only mild to moderate irregularities. Last cath in Dec 2006; s/p cath with Promus DES in distal RCA and Promus DES in the mid vessel in an overlapping fashion  . Chest pain   . HTN (hypertension)   . HLD (hyperlipidemia)   . GERD (gastroesophageal reflux disease)   . COPD (chronic obstructive pulmonary disease)   . Hiatal hernia   . External hemorrhoids without mention of complication   . Headache(784.0)   . Nephrolithiasis   . ED (erectile dysfunction)   . Personal history of colonic polyps 03/12/2010    TUBULAR ADENOMA  . Esophageal stricture   . Tobacco abuse   . AV block, Mobitz 1     noted on EKG March 2013    Past Surgical History  Procedure Laterality Date  . Angioplasty      X3  . Video bronchoscopy  08/13/2011    Procedure: VIDEO BRONCHOSCOPY  WITHOUT FLUORO;  Surgeon: Tanda Rockers, MD;  Location: [facility];  Service: Cardiopulmonary;  Laterality: Bilateral;  . Percutaneous coronary rotoblator intervention (pci-r) N/A 06/30/2012    Procedure: PERCUTANEOUS CORONARY ROTOBLATOR INTERVENTION (PCI-R);  Surgeon: Burnell Blanks, MD;  Location: [facility] LAB;  Service: Cardiovascular;  Laterality: N/A;     Current Outpatient Prescriptions  Medication Sig Dispense Refill  . aspirin EC 81 MG tablet Take 81 mg by mouth daily.    Marland Kitchen atorvastatin (LIPITOR) 80 MG tablet Take 80 mg by mouth daily.    . Choline Fenofibrate 135 MG capsule Take 1 capsule (135 mg total) by mouth daily. 90 capsule 3  . clopidogrel (PLAVIX) 75 MG tablet Take 1 tablet (75 mg total) by mouth daily. 90 tablet 3  . famotidine (PEPCID) 20 MG tablet Take 20 mg by mouth daily as needed for heartburn.    . hydrochlorothiazide (HYDRODIURIL) 25 MG tablet Take 0.5 tablets (12.5 mg total) by mouth daily. 45 tablet 3  . metoprolol (LOPRESSOR) 25 MG tablet Take 1 tablet (25 mg total) by mouth 2 (two) times daily. 180 tablet 3  . naproxen sodium (ANAPROX) 220 MG tablet Take 220 mg by mouth daily as needed (headache).    Marland Kitchen  nitroGLYCERIN (NITROSTAT) 0.4 MG SL tablet Place 1 tablet (0.4 mg total) under the tongue every 5 (five) minutes as needed for chest pain. 25 tablet 6  . RABEprazole (ACIPHEX) 20 MG tablet Take 2 tablets (40 mg total) by mouth daily. 60 tablet 11   No current facility-administered medications for this visit.    Allergies:   Lisinopril; Amlodipine besylate; Propofol; and Rosuvastatin    Social History:  The patient  reports that he quit smoking about 3 years ago. His smoking use included Cigarettes. He has a 20 pack-year smoking history. He has never used smokeless tobacco. He reports that he drinks alcohol. He reports that he does not use illicit drugs.   Family History:  The patient's family history includes Breast cancer in his sister; Diabetes in his  mother; Heart disease in his father; Kidney failure in his mother. There is no history of Colon cancer or Stomach cancer.    ROS:  Please see the history of present illness.   Otherwise, review of systems are positive for none.   All other systems are reviewed and negative.    PHYSICAL EXAM: VS:  BP 120/70 mmHg  Pulse 69  Ht 5' 10.5" (1.791 m)  Wt 187 lb 6.4 oz (85.004 kg)  BMI 26.50 kg/m2  SpO2 97% , BMI Body mass index is 26.5 kg/(m^2). GEN: Well nourished, well developed, in no acute distress HEENT: normal Neck: no JVD, carotid bruits, or masses Cardiac: RRR; no murmurs, rubs, or gallops,no edema  Respiratory:  clear to auscultation bilaterally, normal work of breathing GI: soft, nontender, nondistended, + BS MS: no deformity or atrophy Skin: warm and dry, no rash Neuro:  Strength and sensation are intact Psych: euthymic mood, full affect   EKG:  EKG is not ordered today.    Recent Labs: 04/26/2014: ALT 21; BUN 22; Creatinine, Ser 1.2; Potassium 5.0; Sodium 139 08/02/2014: TSH 2.49    Lipid Panel    Component Value Date/Time   CHOL 162 04/26/2014 1615   TRIG 151.0* 04/26/2014 1615   HDL 39.60 04/26/2014 1615   CHOLHDL 4 04/26/2014 1615   VLDL 30.2 04/26/2014 1615   LDLCALC 92 04/26/2014 1615   LDLDIRECT 107.0 02/27/2013 0904      Wt Readings from Last 3 Encounters:  11/02/14 187 lb 6.4 oz (85.004 kg)  08/02/14 188 lb 9.6 oz (85.548 kg)  07/08/14 192 lb (87.091 kg)        ASSESSMENT AND PLAN:  1.  Severe OSA with an AHI of 51 events per hour mainly during NREM supine sleep.  She is now on CPAP at 10cm H2O which she tolerates well.  Her d/l today showed an AHI of 2.7/hr on 10cm H2O and 77% compliance in using more than 4 hours nightly.  Patient has been using and benefiting from CPAP use and will continue to benefit from therapy. I have also instructed the patient on proper sleep hygiene, avoidance of sleeping in the supine position and avoidance of alcohol  within 4 hours of bedtime.  The patient was also instructed to avoid driving if sleepy.   2.  HTN - controlled    Current medicines are reviewed at length with the patient today.  The patient does not have concerns regarding medicines.  The following changes have been made:  no change  Labs/ tests ordered today: See above Assessment and Plan No orders of the defined types were placed in this encounter.     Disposition:   FU with me  in 6 months  Signed, Sueanne Margarita, MD  11/02/2014 11:08 AM    [facility] [place], [place], [place]  [address] Phone: [phone]; Fax: [phone]

## 2014-11-07 ENCOUNTER — Encounter: Payer: Self-pay | Admitting: Cardiology

## 2014-11-08 ENCOUNTER — Encounter: Payer: Self-pay | Admitting: Internal Medicine

## 2014-11-08 ENCOUNTER — Other Ambulatory Visit (INDEPENDENT_AMBULATORY_CARE_PROVIDER_SITE_OTHER): Payer: BC Managed Care – PPO | Admitting: *Deleted

## 2014-11-08 ENCOUNTER — Ambulatory Visit (INDEPENDENT_AMBULATORY_CARE_PROVIDER_SITE_OTHER): Payer: BC Managed Care – PPO | Admitting: Internal Medicine

## 2014-11-08 VITALS — BP 114/68 | HR 64 | Temp 97.8°F | Resp 12 | Wt 185.0 lb

## 2014-11-08 DIAGNOSIS — E039 Hypothyroidism, unspecified: Secondary | ICD-10-CM | POA: Diagnosis not present

## 2014-11-08 DIAGNOSIS — E119 Type 2 diabetes mellitus without complications: Secondary | ICD-10-CM | POA: Diagnosis not present

## 2014-11-08 LAB — POCT GLYCOSYLATED HEMOGLOBIN (HGB A1C): Hemoglobin A1C: 6.1

## 2014-11-08 NOTE — Progress Notes (Signed)
Patient ID: Daniel Carey, male   DOB: 11-30-1950, 64 y.o.   MRN: [id number]   HPI  Daniel Carey is a 64 y.o.-year-old male, returning for f/u for hypothyroidism. Last visit 3 mo ago. He is here with his wife.  Reviewed and addended hx: Pt. has been dx with hypothyroidism in 2012; was on Levothyroxine starting low and increasing up to 200 mcg (!) last year >> did not feel well >> stopped.   He ad a last TSH and this was mildly elevated >> I d/w him and suggested to stay off LT4  I reviewed pt's thyroid tests: Lab Results  Component Value Date   TSH 2.49 08/02/2014   TSH 4.695* 10/26/2013   TSH 1.56 06/08/2012   TSH 6.83* 07/20/2011   TSH 5.61* 07/14/2011   TSH 5.53* 12/05/2010   TSH 3.07 02/13/2010   FREET4 0.79 08/02/2014    Pt denies feeling nodules in neck, hoarseness, dysphagia/odynophagia, SOB with lying down.  Pt describes: - no cold intolerance - + weight loss (3 lbs) - intentional - no fatigue - no constipation - no dry skin - no hair falling - no depression  He sees Dr Acie Fredrickson >> had angioplasy 3 times in the past, 2 stents in last year. He had an AMI 1996 (mild). He has a Mobitz 1 AVB.  DM2: - last HbA1c high: Lab Results  Component Value Date   HGBA1C 6.7* 08/02/2014   HGBA1C 6.7* 07/20/2011  With 2 levels higher than 6.5%, he does have a diagnosis of diabetes.  I advised him to check sugars at home >> 80-140. Lowest after exercise. He started to exercise more and make changes in his diet lately.  He has OSA >> wears C-pap.   ROS: Constitutional: no weight gain/loss, no fatigue, no subjective hyperthermia/hypothermia Eyes: no blurry vision, no xerophthalmia ENT: no sore throat, no nodules palpated in throat, no dysphagia/odynophagia, no hoarseness Cardiovascular: no CP/SOB/palpitations/leg swelling Respiratory: no cough/SOB Gastrointestinal: no N/V/D/C/heartburn Musculoskeletal: no muscle/joint aches Skin: no rashes Neurological: no  tremors/numbness/tingling/dizziness  I reviewed pt's medications, allergies, PMH, social hx, family hx, and changes were documented in the history of present illness. Otherwise, unchanged from my initial visit note:  Past Medical History  Diagnosis Date  . CAD (coronary artery disease)     moderate to severe distal CAD with large epicardial arteries having only mild to moderate irregularities. Last cath in Dec 2006; s/p cath with Promus DES in distal RCA and Promus DES in the mid vessel in an overlapping fashion  . Chest pain   . HTN (hypertension)   . HLD (hyperlipidemia)   . GERD (gastroesophageal reflux disease)   . COPD (chronic obstructive pulmonary disease)   . Hiatal hernia   . External hemorrhoids without mention of complication   . Headache(784.0)   . Nephrolithiasis   . ED (erectile dysfunction)   . Personal history of colonic polyps 03/12/2010    TUBULAR ADENOMA  . Esophageal stricture   . Tobacco abuse   . AV block, Mobitz 1     noted on EKG March 2013   Past Surgical History  Procedure Laterality Date  . Angioplasty      X3  . Video bronchoscopy  08/13/2011    Procedure: VIDEO BRONCHOSCOPY WITHOUT FLUORO;  Surgeon: Tanda Rockers, MD;  Location: [facility];  Service: Cardiopulmonary;  Laterality: Bilateral;  . Percutaneous coronary rotoblator intervention (pci-r) N/A 06/30/2012    Procedure: PERCUTANEOUS CORONARY ROTOBLATOR INTERVENTION (PCI-R);  Surgeon: Annita Brod  Angelena Form, MD;  Location: [facility] CATH LAB;  Service: Cardiovascular;  Laterality: N/A;   History   Social History  . 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 Smoking status: Former Smoker -- 0.50 packs/day for 40 years    Types: Cigarettes    Quit date: 08/25/2011  . Smokeless tobacco: Never Used  . Alcohol Use: 0.0 oz/week     Comment: rare  . Drug Use: No   Current Outpatient  Prescriptions on File Prior to Visit  Medication Sig Dispense Refill  . aspirin EC 81 MG tablet Take 81 mg by mouth daily.    Marland Kitchen atorvastatin (LIPITOR) 80 MG tablet Take 80 mg by mouth daily.    . Choline Fenofibrate 135 MG capsule Take 1 capsule (135 mg total) by mouth daily. 90 capsule 3  . clopidogrel (PLAVIX) 75 MG tablet Take 1 tablet (75 mg total) by mouth daily. 90 tablet 3  . famotidine (PEPCID) 20 MG tablet Take 20 mg by mouth daily as needed for heartburn.    . hydrochlorothiazide (HYDRODIURIL) 25 MG tablet Take 0.5 tablets (12.5 mg total) by mouth daily. 45 tablet 3  . metoprolol (LOPRESSOR) 25 MG tablet Take 1 tablet (25 mg total) by mouth 2 (two) times daily. 180 tablet 3  . naproxen sodium (ANAPROX) 220 MG tablet Take 220 mg by mouth daily as needed (headache).    . nitroGLYCERIN (NITROSTAT) 0.4 MG SL tablet Place 1 tablet (0.4 mg total) under the tongue every 5 (five) minutes as needed for chest pain. 25 tablet 6  . RABEprazole (ACIPHEX) 20 MG tablet Take 2 tablets (40 mg total) by mouth daily. 60 tablet 11   No current facility-administered medications on file prior to visit.   Allergies  Allergen Reactions  . Lisinopril Cough and Rash  . Amlodipine Besylate Other (See Comments)    Headache, weakness, dizziness  . Propofol Other (See Comments)    Heart rate dropped  . Rosuvastatin Other (See Comments)     muscle aches   Family History  Problem Relation Age of Onset  . Heart disease Father     hx of MI  . Diabetes Mother     and brother   . Kidney failure Mother   . Colon cancer Neg Hx   . Stomach cancer Neg Hx   . Breast cancer Sister    PE: BP 114/68 mmHg  Pulse 64  Temp(Src) 97.8 F (36.6 C) (Oral)  Resp 12  Wt 185 lb (83.915 kg)  SpO2 96% Wt Readings from Last 3 Encounters:  11/08/14 185 lb (83.915 kg)  11/02/14 187 lb 6.4 oz (85.004 kg)  08/02/14 188 lb 9.6 oz (85.548 kg)   Constitutional: slightly overweight, in NAD Eyes: PERRLA, EOMI, no  exophthalmos ENT: moist mucous membranes, no thyromegaly, no cervical lymphadenopathy Cardiovascular: RRR, No MRG Respiratory: CTA B Gastrointestinal: abdomen soft, NT, ND, BS+ Musculoskeletal: no deformities, strength intact in all 4 Skin: moist, warm, no rashes Neurological: no tremor with outstretched hands, DTR normal in all 4  ASSESSMENT: 1. Mild hypothyroidism  2. DM2  PLAN:  1. Patient with few years h/o mild hypothyroidism, not on levothyroxine therapy. He appears euthyroid. - We discussed with him and his wife about the fact that he has mild hypothyroidism, which is not dangerous, and he can only expect to see  complications from it if TSH >10. He is very reticent to start unnecessary meds and would like to avoid Levothyroxine due to previous experience with the med. (He probably had an intolerance to Levothyroxine before 2/2 the high dose used). I again suggested that we continue to check his TFTs every 6 mo >> next check will be in 4 months at next visit. If TSH raises above 10 or he has sxs of hypothyroidism, we can start 25 mcg Levothyroxine.  - She does not appear to have a goiter, thyroid nodules, or neck compression symptoms - Return in about 4 months (around 03/11/2015).   2. DM2 - uncomplicated - 2 elevated HbA1c of 6.7% - recheck hbA1c today >> 6.1% (improved!) - He is reticent to start on any medication if not absolutely necessary  Need to fax results to PCP.

## 2014-11-08 NOTE — Patient Instructions (Signed)
Please continue off thyroid hormones.  Great job with diet and exercise!  HbA1c is 6.1%!  Please return in 4 months with your sugar log.

## 2014-11-21 ENCOUNTER — Ambulatory Visit: Payer: BC Managed Care – PPO | Admitting: Cardiovascular Disease

## 2014-12-20 ENCOUNTER — Encounter: Payer: Self-pay | Admitting: Cardiovascular Disease

## 2014-12-20 ENCOUNTER — Ambulatory Visit (INDEPENDENT_AMBULATORY_CARE_PROVIDER_SITE_OTHER): Payer: BLUE CROSS/BLUE SHIELD | Admitting: Cardiovascular Disease

## 2014-12-20 VITALS — BP 140/82 | HR 68 | Ht 70.5 in | Wt 189.0 lb

## 2014-12-20 DIAGNOSIS — E785 Hyperlipidemia, unspecified: Secondary | ICD-10-CM | POA: Diagnosis not present

## 2014-12-20 DIAGNOSIS — I2510 Atherosclerotic heart disease of native coronary artery without angina pectoris: Secondary | ICD-10-CM | POA: Diagnosis not present

## 2014-12-20 NOTE — Progress Notes (Signed)
[name] Date of Birth  Aug 06, 1950       [place] [address], [address], [place], [place]  [address]   [place], [place]  [address] [phone]     [phone]   Fax  [phone]    Fax [phone]  Problem List: 1. CAD - 2.5 x 38 mm Promus Premier DES in the distal RCA and a 2.75 x 38 mm Promus Premier DES in the mid vessel in an overlapping fashion. The distal sten was post-dilated with a 2.5 x 20 mm [place] balloon. The proximal portion of the stent was post-dilated with a 3.0 x 20 mm [place] balloon ( June 30, 2012)  2. Bradycardia after getting propafol for endoscopy. 3. Obstructive sleep apnea:     History of Present Illness:  Jahlon is feeling well today.  He's been getting some exercise. He does yard work and walks on the treadmill occasionally. He had an episode of bradycardia after getting IV protocol for an endoscopy procedure. He was seen by [name]. His metoprolol dose was decreased. He has also had a bronchoscopy with [name]. He does not have any evidence of COPD.  Both he and his wife have stop smoking.  ( Aug 20, 2011)  Feb. 28, 2014:  He continues to have increasing chest pain.  He has not been smoking.  The pains are associated with exertion, last 5-10 minutes, resolve with 1 SL NTG.   He's had a mildly elevated TSH for the past year or so and has had some fatigue.    his TSH level 6.8.      His medical doctor started him on Synthroid. He gradually has titrated up to 0.1 mg a day. Since that time he's had increasing episodes of chest pain.  Aug 29, 2012:  [name] saw him in February we performed a cardiac catheterization because of increasing chest pain. He was found to have sequential stenosis in his mid right coronary artery and had PCI of his right coronary artery ( 2.5 x 38 mm Promus Premier DES in the distal RCA and a 2.75 x 38 mm Promus Premier DES in the mid vessel in an overlapping fashion. The  distal sten was post-dilated with a 2.5 x 20 mm [place] balloon. The proximal portion of the stent was post-dilated with a 3.0 x 20 mm [place] balloon)  He and his wife are concerned about whether or not the Plavix is active.  His generic plavix manufacturer has changed.   He would like to be tested.   October 14, 2012:  Tyquan has been having some occasional CP.  He has had some episodes of dyspnea while climbing up a hill playing golf.  This past Monday night, he had some chest pain - possible due to indigestions.  these  He had lost his NTG - he has refilled it now.    Nov. 10, 2014:  Dmonte is doing well.  He is working too much according to wife. he's had a few CP but not severe enough to take NTG.  Still has some dyspnea with climbing steps.   He had stenting in march 2014.  October 23, 2013:  Shahram is doing well.   Playing golf.  Working in the yard.  Not exercising as much.   He has lost about 6 lbs.  He had labs drawn today.     Jan. 7, 2016:  He has been  waking up in the middle of the night and feels like he cant catch his  Breath. Still working out - on the treadmill twice this week and has done well.   Sept. 1, 2016:  Has had some short of breath. Does not get any exercise.  His CP is much better since starting CPAP .  Is having bloating  - thinks its due to the lipitor   Current Outpatient Prescriptions on File Prior to Visit  Medication Sig Dispense Refill  . aspirin EC 81 MG tablet Take 81 mg by mouth daily.    Marland Kitchen atorvastatin (LIPITOR) 80 MG tablet Take 80 mg by mouth daily.    . Choline Fenofibrate 135 MG capsule Take 1 capsule (135 mg total) by mouth daily. 90 capsule 3  . clopidogrel (PLAVIX) 75 MG tablet Take 1 tablet (75 mg total) by mouth daily. 90 tablet 3  . famotidine (PEPCID) 20 MG tablet Take 20 mg by mouth daily as needed for heartburn.    . hydrochlorothiazide (HYDRODIURIL) 25 MG tablet Take 0.5 tablets (12.5 mg total) by mouth daily. 45 tablet 3  . metoprolol  (LOPRESSOR) 25 MG tablet Take 1 tablet (25 mg total) by mouth 2 (two) times daily. 180 tablet 3  . naproxen sodium (ANAPROX) 220 MG tablet Take 220 mg by mouth daily as needed (headache).    . nitroGLYCERIN (NITROSTAT) 0.4 MG SL tablet Place 1 tablet (0.4 mg total) under the tongue every 5 (five) minutes as needed for chest pain. 25 tablet 6  . RABEprazole (ACIPHEX) 20 MG tablet Take 2 tablets (40 mg total) by mouth daily. 60 tablet 11   No current facility-administered medications on file prior to visit.    Allergies  Allergen Reactions  . Lisinopril Cough and Rash  . Amlodipine Besylate Other (See Comments)    Headache, weakness, dizziness  . Propofol Other (See Comments)    Heart rate dropped  . Rosuvastatin Other (See Comments)     muscle aches    Past Medical History  Diagnosis Date  . CAD (coronary artery disease)     moderate to severe distal CAD with large epicardial arteries having only mild to moderate irregularities. Last cath in Dec 2006; s/p cath with Promus DES in distal RCA and Promus DES in the mid vessel in an overlapping fashion  . Chest pain   . HTN (hypertension)   . HLD (hyperlipidemia)   . GERD (gastroesophageal reflux disease)   . COPD (chronic obstructive pulmonary disease)   . Hiatal hernia   . External hemorrhoids without mention of complication   . Headache(784.0)   . Nephrolithiasis   . ED (erectile dysfunction)   . Personal history of colonic polyps 03/12/2010    TUBULAR ADENOMA  . Esophageal stricture   . Tobacco abuse   . AV block, Mobitz 1     noted on EKG March 2013    Past Surgical History  Procedure Laterality Date  . Angioplasty      X3  . Video bronchoscopy  08/13/2011    Procedure: VIDEO BRONCHOSCOPY WITHOUT FLUORO;  Surgeon: Tanda Rockers, MD;  Location: [facility];  Service: Cardiopulmonary;  Laterality: Bilateral;  . Percutaneous coronary rotoblator intervention (pci-r) N/A 06/30/2012    Procedure: PERCUTANEOUS CORONARY  ROTOBLATOR INTERVENTION (PCI-R);  Surgeon: Burnell Blanks, MD;  Location: [facility] CATH LAB;  Service: Cardiovascular;  Laterality: N/A;    History  Smoking status  . Former Smoker -- 0.50 packs/day for 40 years  .  Types: Cigarettes  . Quit date: 08/25/2011  Smokeless tobacco  . Never Used    History  Alcohol Use  . 0.0 oz/week    Comment: rare    Family History  Problem Relation Age of Onset  . Heart disease Father     hx of MI  . Diabetes Mother     and brother   . Kidney failure Mother   . Colon cancer Neg Hx   . Stomach cancer Neg Hx   . Breast cancer Sister     Reviw of Systems:  Reviewed in the HPI.  All other systems are negative.  Physical Exam: Blood pressure 140/82, pulse 68, height 5' 10.5" (1.791 m), weight 85.73 kg (189 lb), SpO2 98 %. General: Well developed, well nourished, in no acute distress.  Head: Normocephalic, atraumatic, sclera non-icteric, mucus membranes are moist,   Neck: Supple. Carotids are 2 + without bruits. No JVD  Lungs: Clear bilaterally to auscultation.  Heart: regular rate.  normal  S1 S2. No murmurs, gallops or rubs.  Abdomen: Soft, non-tender, non-distended with normal bowel sounds. No hepatomegaly. No rebound/guarding. No masses.  Msk:  Strength and tone are normal  Extremities: No clubbing or cyanosis. No edema.  Distal pedal pulses are 2+ and equal bilaterally.  Neuro: Alert and oriented X 3. Moves all extremities spontaneously.  Psych:  Responds to questions appropriately with a normal affect.  ECG:  Assessment / Plan:   1. CAD - 2.5 x 38 mm Promus Premier DES in the distal RCA and a 2.75 x 38 mm Promus Premier DES in the mid vessel in an overlapping fashion. The distal sten was post-dilated with a 2.5 x 20 mm [place] balloon. The proximal portion of the stent was post-dilated with a 3.0 x 20 mm [place] balloon ( June 30, 2012) No angina  His chest pains have improved significant since he was started on CPAP for his  obstructive sleep apnea.  2. Bradycardia after getting propafol for endoscopy.  3.  Obstructive sleep apnea:    Sees Dr. Radford Pax - feeling better.   4. Hyperlipidemia: We will continue with current dose of atorvastatin and Trilipix. He's having some bloating that he thinks might be due to the atorvastatin. I've advised him to take Gaviscon tablet along with the atorvastatin which might minimize his bloating.     Lamar Meter, Wonda Cheng, MD  12/20/2014 3:28 PM    [facility] [place],  [place], [place]  [address] Pager [phone] Phone: [phone]; Fax: [phone]   [facility]  [address] [place], [place]  [address] [phone]   Fax [phone]

## 2014-12-20 NOTE — Patient Instructions (Signed)

## 2015-02-01 ENCOUNTER — Ambulatory Visit: Payer: BC Managed Care – PPO | Admitting: Internal Medicine

## 2015-02-08 ENCOUNTER — Ambulatory Visit: Payer: BC Managed Care – PPO | Admitting: Internal Medicine

## 2015-02-21 ENCOUNTER — Encounter: Payer: Self-pay | Admitting: Cardiology

## 2015-03-12 ENCOUNTER — Telehealth: Payer: Self-pay | Admitting: Cardiovascular Disease

## 2015-03-12 DIAGNOSIS — E785 Hyperlipidemia, unspecified: Secondary | ICD-10-CM

## 2015-03-12 NOTE — Telephone Encounter (Signed)
Spoke with patient and advised him that lab work from 9/1 was collected but never resulted.  I spoke with Robert Bellow our lab supervisor and he advised that blood would need to be collected as our lab processing center has no way to track samples that are that old.  I scheduled patient for lab appointment on 11/29 and advised that there should not be a charge due to the fact that the original samples were never processed.  He verbalized understanding and agreement and thanked me for the call.

## 2015-03-12 NOTE — Telephone Encounter (Signed)
NeW Message  Pt calling about lab work from 12/20/14. Pt stated can give results to spouse. Please call back and discuss.

## 2015-03-19 ENCOUNTER — Other Ambulatory Visit (INDEPENDENT_AMBULATORY_CARE_PROVIDER_SITE_OTHER): Payer: BLUE CROSS/BLUE SHIELD

## 2015-03-19 DIAGNOSIS — E785 Hyperlipidemia, unspecified: Secondary | ICD-10-CM | POA: Diagnosis not present

## 2015-03-19 LAB — LIPID PANEL
Cholesterol: 172 mg/dL (ref 125–200)
HDL: 42 mg/dL (ref >=40)
LDL Cholesterol: 87 mg/dL (ref <130)
Total CHOL/HDL Ratio: 4.1 Ratio (ref <=5.0)
Triglycerides: 214 mg/dL — ABNORMAL HIGH (ref <150)
VLDL: 43 mg/dL — ABNORMAL HIGH (ref <30)

## 2015-03-19 LAB — COMPREHENSIVE METABOLIC PANEL
ALT: 23 U/L (ref 9–46)
AST: 15 U/L (ref 10–35)
Albumin: 3.9 g/dL (ref 3.6–5.1)
Alkaline Phosphatase: 64 U/L (ref 40–115)
BUN: 17 mg/dL (ref 7–25)
CO2: 29 mmol/L (ref 20–31)
Calcium: 9.5 mg/dL (ref 8.6–10.3)
Chloride: 99 mmol/L (ref 98–110)
Creat: 1 mg/dL (ref 0.70–1.25)
Glucose, Bld: 135 mg/dL — ABNORMAL HIGH (ref 65–99)
Potassium: 4.7 mmol/L (ref 3.5–5.3)
Sodium: 135 mmol/L (ref 135–146)
Total Bilirubin: 0.8 mg/dL (ref 0.2–1.2)
Total Protein: 6.7 g/dL (ref 6.1–8.1)

## 2015-04-03 ENCOUNTER — Other Ambulatory Visit: Payer: Self-pay | Admitting: Gastroenterology

## 2015-05-07 ENCOUNTER — Ambulatory Visit (INDEPENDENT_AMBULATORY_CARE_PROVIDER_SITE_OTHER): Payer: BLUE CROSS/BLUE SHIELD | Admitting: Internal Medicine

## 2015-05-07 ENCOUNTER — Encounter: Payer: Self-pay | Admitting: Internal Medicine

## 2015-05-07 VITALS — BP 124/68 | HR 69 | Temp 97.6°F | Resp 12 | Wt 188.4 lb

## 2015-05-07 DIAGNOSIS — E039 Hypothyroidism, unspecified: Secondary | ICD-10-CM | POA: Diagnosis not present

## 2015-05-07 DIAGNOSIS — E119 Type 2 diabetes mellitus without complications: Secondary | ICD-10-CM | POA: Diagnosis not present

## 2015-05-07 LAB — TSH: TSH: 0.23 u[IU]/mL — ABNORMAL LOW (ref 0.35–4.50)

## 2015-05-07 LAB — T3, FREE: T3, Free: 3.9 pg/mL (ref 2.3–4.2)

## 2015-05-07 LAB — T4, FREE: Free T4: 1.25 ng/dL (ref 0.60–1.60)

## 2015-05-07 LAB — HEMOGLOBIN A1C: Hgb A1c MFr Bld: 6.7 % — ABNORMAL HIGH (ref 4.6–6.5)

## 2015-05-07 NOTE — Progress Notes (Signed)
Patient ID: Daniel Carey, male   DOB: 1951-01-23, 65 y.o.   MRN: [id number]   HPI  Daniel Carey is a 65 y.o.-year-old male, returning for f/u for hypothyroidism and DM2, controlled, non-insulin-dependent. Last visit 6 mo ago. He is here with his wife.  Pt. has been dx with hypothyroidism in 2012; was on Levothyroxine starting low and increasing up to 200 mcg (!) last year >> did not feel well >> stopped.   Wr are following him w/o medication as TFTs fluctuating but they are in or close to the ULN.  I reviewed pt's thyroid tests: Lab Results  Component Value Date   TSH 2.49 08/02/2014   TSH 4.695* 10/26/2013   TSH 1.56 06/08/2012   TSH 6.83* 07/20/2011   TSH 5.61* 07/14/2011   TSH 5.53* 12/05/2010   TSH 3.07 02/13/2010   FREET4 0.79 08/02/2014    Pt denies feeling nodules in neck, hoarseness, dysphagia/odynophagia, SOB with lying down.  Pt describes: - no cold intolerance - no weight loss  - no fatigue - no constipation - no dry skin - no hair falling - no depression  He sees Dr Acie Fredrickson >> had angioplasy 3 times in the past, 2 stents in last year. He had an AMI 1996 (mild). He has a Mobitz 1 AVB.  DM2: - last HbA1c high: Lab Results  Component Value Date   HGBA1C 6.1 11/08/2014   HGBA1C 6.7* 08/02/2014   HGBA1C 6.7* 07/20/2011  With 2 levels higher than 6.5%, he does have a diagnosis of diabetes.  I advised him to check sugars at home >> still 80-140.   He started to exercise more and make changes in his diet >> less over the [place], though.  He has OSA >> wears C-pap.   ROS: Constitutional: no weight gain/loss, no fatigue, no subjective hyperthermia/hypothermia Eyes: no blurry vision, no xerophthalmia ENT: no sore throat, no nodules palpated in throat, no dysphagia/odynophagia, no hoarseness Cardiovascular: no CP/SOB/palpitations/leg swelling Respiratory: no cough/SOB Gastrointestinal: no N/V/D/C/heartburn Musculoskeletal: no muscle/joint aches Skin: no  rashes Neurological: no tremors/numbness/tingling/dizziness  I reviewed pt's medications, allergies, PMH, social hx, family hx, and changes were documented in the history of present illness. Otherwise, unchanged from my initial visit note:  Past Medical History  Diagnosis Date  . CAD (coronary artery disease)     moderate to severe distal CAD with large epicardial arteries having only mild to moderate irregularities. Last cath in Dec 2006; s/p cath with Promus DES in distal RCA and Promus DES in the mid vessel in an overlapping fashion  . Chest pain   . HTN (hypertension)   . HLD (hyperlipidemia)   . GERD (gastroesophageal reflux disease)   . COPD (chronic obstructive pulmonary disease) ([place])   . Hiatal hernia   . External hemorrhoids without mention of complication   . Headache(784.0)   . Nephrolithiasis   . ED (erectile dysfunction)   . Personal history of colonic polyps 03/12/2010    TUBULAR ADENOMA  . Esophageal stricture   . Tobacco abuse   . AV block, Mobitz 1     noted on EKG March 2013   Past Surgical History  Procedure Laterality Date  . Angioplasty      X3  . Video bronchoscopy  08/13/2011    Procedure: VIDEO BRONCHOSCOPY WITHOUT FLUORO;  Surgeon: Tanda Rockers, MD;  Location: [facility];  Service: Cardiopulmonary;  Laterality: Bilateral;  . Percutaneous coronary rotoblator intervention (pci-r) N/A 06/30/2012    Procedure: PERCUTANEOUS CORONARY ROTOBLATOR  INTERVENTION (PCI-R);  Surgeon: Burnell Blanks, MD;  Location: [facility] CATH LAB;  Service: Cardiovascular;  Laterality: N/A;   History   Social History  . 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 Smoking status: Former Smoker -- 0.50 packs/day for 40 years    Types: Cigarettes    Quit date: 08/25/2011  . Smokeless tobacco: Never Used  . Alcohol Use: 0.0 oz/week     Comment: rare  . Drug  Use: No   Current Outpatient Prescriptions on File Prior to Visit  Medication Sig Dispense Refill  . aspirin EC 81 MG tablet Take 81 mg by mouth daily.    Marland Kitchen atorvastatin (LIPITOR) 80 MG tablet Take 80 mg by mouth daily.    . Choline Fenofibrate 135 MG capsule Take 1 capsule (135 mg total) by mouth daily. 90 capsule 3  . clopidogrel (PLAVIX) 75 MG tablet Take 1 tablet (75 mg total) by mouth daily. 90 tablet 3  . famotidine (PEPCID) 20 MG tablet Take 20 mg by mouth daily as needed for heartburn.    . hydrochlorothiazide (HYDRODIURIL) 25 MG tablet Take 0.5 tablets (12.5 mg total) by mouth daily. 45 tablet 3  . metoprolol (LOPRESSOR) 25 MG tablet Take 1 tablet (25 mg total) by mouth 2 (two) times daily. 180 tablet 3  . naproxen sodium (ANAPROX) 220 MG tablet Take 220 mg by mouth daily as needed (headache).    . nitroGLYCERIN (NITROSTAT) 0.4 MG SL tablet Place 1 tablet (0.4 mg total) under the tongue every 5 (five) minutes as needed for chest pain. 25 tablet 6  . RABEprazole (ACIPHEX) 20 MG tablet Take 2 tablets (40 mg total) by mouth daily. 60 tablet 11   No current facility-administered medications on file prior to visit.   Allergies  Allergen Reactions  . Lisinopril Cough and Rash  . Amlodipine Besylate Other (See Comments)    Headache, weakness, dizziness  . Propofol Other (See Comments)    Heart rate dropped  . Rosuvastatin Other (See Comments)     muscle aches   Family History  Problem Relation Age of Onset  . Heart disease Father     hx of MI  . Diabetes Mother     and brother   . Kidney failure Mother   . Colon cancer Neg Hx   . Stomach cancer Neg Hx   . Breast cancer Sister    PE: BP 124/68 mmHg  Pulse 69  Temp(Src) 97.6 F (36.4 C) (Oral)  Resp 12  Wt 188 lb 6.4 oz (85.458 kg)  SpO2 95% Wt Readings from Last 3 Encounters:  05/07/15 188 lb 6.4 oz (85.458 kg)  12/20/14 189 lb (85.73 kg)  11/08/14 185 lb (83.915 kg)   Constitutional: slightly overweight, in  NAD Eyes: PERRLA, EOMI, no exophthalmos ENT: moist mucous membranes, no thyromegaly, no cervical lymphadenopathy Cardiovascular: RRR, No MRG Respiratory: CTA B Gastrointestinal: abdomen soft, NT, ND, BS+ Musculoskeletal: no deformities, strength intact in all 4 Skin: moist, warm, no rashes Neurological: no tremor with outstretched hands, DTR normal in all 4  ASSESSMENT: 1. Mild hypothyroidism  2. DM2, controlled, not on medication  PLAN:  1. Patient with few years h/o mild hypothyroidism, not on levothyroxine therapy. He appears euthyroid. He does not appear to have a goiter, thyroid nodules, or neck compression symptoms - I again  discussed with him and his wife about the fact that he has mild hypothyroidism, which is not dangerous, and he can only expect to see complications from it if TSH >10. He is very reticent to start unnecessary meds and would like to avoid Levothyroxine due to previous experience with the med. (He probably had an intolerance to Levothyroxine before 2/2 the high dose used). -  Will continue to check his TFTs every 6-8 mo. Will check today. If TSH raises above 10 or he has sxs of hypothyroidism, we can start 25 mcg Levothyroxine.  - Return in about 4 months (around 09/04/2015).   2. DM2, controlled - uncomplicated - 2 elevated HbA1c of 6.7%, but 6.1% at last visit - recheck hbA1c today - He is reticent to start on any medication if not absolutely necessary   Office Visit on 05/07/2015  Component Date Value Ref Range Status  . Free T4 05/07/2015 1.25  0.60 - 1.60 ng/dL Final  . T3, Free 05/07/2015 3.9  2.3 - 4.2 pg/mL Final  . TSH 05/07/2015 0.23* 0.35 - 4.50 uIU/mL Final  . Hgb A1c MFr Bld 05/07/2015 6.7* 4.6 - 6.5 % Final   Glycemic Control Guidelines for People with Diabetes:Non Diabetic:  <6%Goal of Therapy: <7%Additional Action Suggested:  >8%    Msg sent: Dear Daniel Carey, HbA1c is higher, as expected over the [place], but I believe this will come back  down at next check. Your TSH (thyroid test) is a little low now, while the rest of the thyroid tests are normal. No intervention is needed for now. We will recheck them at next visit.  Sincerely, Philemon Kingdom MD

## 2015-05-07 NOTE — Patient Instructions (Signed)
Please continue off thyroid hormones.  Please return in 4 months with your sugar log.   Please stop at the lab.

## 2015-05-09 ENCOUNTER — Other Ambulatory Visit: Payer: Self-pay | Admitting: Cardiovascular Disease

## 2015-05-14 ENCOUNTER — Other Ambulatory Visit: Payer: Self-pay | Admitting: Cardiovascular Disease

## 2015-06-17 ENCOUNTER — Ambulatory Visit: Payer: BLUE CROSS/BLUE SHIELD | Admitting: Cardiology

## 2015-06-20 ENCOUNTER — Ambulatory Visit: Payer: BLUE CROSS/BLUE SHIELD | Admitting: Cardiovascular Disease

## 2015-06-20 ENCOUNTER — Other Ambulatory Visit: Payer: BLUE CROSS/BLUE SHIELD

## 2015-07-19 ENCOUNTER — Other Ambulatory Visit (INDEPENDENT_AMBULATORY_CARE_PROVIDER_SITE_OTHER): Payer: BLUE CROSS/BLUE SHIELD | Admitting: *Deleted

## 2015-07-19 ENCOUNTER — Encounter: Payer: Self-pay | Admitting: Cardiovascular Disease

## 2015-07-19 ENCOUNTER — Ambulatory Visit (INDEPENDENT_AMBULATORY_CARE_PROVIDER_SITE_OTHER): Payer: BLUE CROSS/BLUE SHIELD | Admitting: Cardiovascular Disease

## 2015-07-19 VITALS — BP 122/88 | HR 66 | Ht 70.5 in | Wt 188.0 lb

## 2015-07-19 DIAGNOSIS — I2510 Atherosclerotic heart disease of native coronary artery without angina pectoris: Secondary | ICD-10-CM | POA: Diagnosis not present

## 2015-07-19 DIAGNOSIS — E785 Hyperlipidemia, unspecified: Secondary | ICD-10-CM | POA: Diagnosis not present

## 2015-07-19 DIAGNOSIS — I10 Essential (primary) hypertension: Secondary | ICD-10-CM

## 2015-07-19 DIAGNOSIS — R0600 Dyspnea, unspecified: Secondary | ICD-10-CM | POA: Diagnosis not present

## 2015-07-19 DIAGNOSIS — R0609 Other forms of dyspnea: Secondary | ICD-10-CM

## 2015-07-19 LAB — LIPID PANEL
Cholesterol: 169 mg/dL (ref 125–200)
HDL: 39 mg/dL — ABNORMAL LOW (ref >=40)
LDL Cholesterol: 71 mg/dL (ref <130)
Total CHOL/HDL Ratio: 4.3 Ratio (ref <=5.0)
Triglycerides: 295 mg/dL — ABNORMAL HIGH (ref <150)
VLDL: 59 mg/dL — ABNORMAL HIGH (ref <30)

## 2015-07-19 LAB — BASIC METABOLIC PANEL
BUN: 19 mg/dL (ref 7–25)
CO2: 28 mmol/L (ref 20–31)
Calcium: 8.9 mg/dL (ref 8.6–10.3)
Chloride: 104 mmol/L (ref 98–110)
Creat: 0.93 mg/dL (ref 0.70–1.25)
Glucose, Bld: 125 mg/dL — ABNORMAL HIGH (ref 65–99)
Potassium: 4.5 mmol/L (ref 3.5–5.3)
Sodium: 140 mmol/L (ref 135–146)

## 2015-07-19 LAB — HEPATIC FUNCTION PANEL
ALT: 21 U/L (ref 9–46)
AST: 14 U/L (ref 10–35)
Albumin: 3.9 g/dL (ref 3.6–5.1)
Alkaline Phosphatase: 66 U/L (ref 40–115)
Bilirubin, Direct: 0.1 mg/dL (ref <=0.2)
Indirect Bilirubin: 0.4 mg/dL (ref 0.2–1.2)
Total Bilirubin: 0.5 mg/dL (ref 0.2–1.2)
Total Protein: 6.5 g/dL (ref 6.1–8.1)

## 2015-07-19 MED ORDER — ALBUTEROL SULFATE HFA 108 (90 BASE) MCG/ACT IN AERS: 2.0000 | INHALATION_SPRAY | Freq: Four times a day (QID) | RESPIRATORY_TRACT | Status: DC | PRN

## 2015-07-19 NOTE — Patient Instructions (Addendum)
Medication Instructions:  USE Albuterol (Pro Air) 2 puffs every 6 hours as needed for cough/wheezing   Labwork: TODAY - fasting cholesterol, complete metabolic panel  Your physician recommends that you return for lab work in: 6 months on the day of or a few days before your office visit with Dr. Acie Fredrickson.  You will need to FAST for this appointment - nothing to eat or drink after midnight the night before except water.    Testing/Procedures: Your physician has requested that you have an echocardiogram. Echocardiography is a painless test that uses sound waves to create images of your heart. It provides your doctor with information about the size and shape of your heart and how well your heart's chambers and valves are working. This procedure takes approximately one hour. There are no restrictions for this procedure.   Follow-Up: Your physician wants you to follow-up in: 6 months with Dr. Acie Fredrickson.  You will receive a reminder letter in the mail two months in advance. If you don't receive a letter, please call our office to schedule the follow-up appointment.   If you need a refill on your cardiac medications before your next appointment, please call your pharmacy.   Thank you for choosing [facility]! Christen Bame, RN [phone]

## 2015-07-19 NOTE — Addendum Note (Signed)
Addended by: Eulis Foster on: 07/19/2015 08:26 AM   Modules accepted: Orders

## 2015-07-19 NOTE — Addendum Note (Signed)
Addended by: Eulis Foster on: 07/19/2015 08:27 AM   Modules accepted: Orders

## 2015-07-19 NOTE — Progress Notes (Signed)
[name] Date of Birth  1950/07/04       [facility] [address], [place], [place]  [address]   [place], [place]  [address] [phone]     [phone]   Fax  [phone]    Fax [phone]  Problem List: 1. CAD - 2.5 x 38 mm Promus Premier DES in the distal RCA and a 2.75 x 38 mm Promus Premier DES in the mid vessel in an overlapping fashion. The distal sten was post-dilated with a 2.5 x 20 mm Justice balloon. The proximal portion of the stent was post-dilated with a 3.0 x 20 mm Opal balloon ( June 30, 2012)  2. Bradycardia after getting propafol for endoscopy. 3. Obstructive sleep apnea:     History of Present Illness:  Daniel Carey is feeling well today.  He's been getting some exercise. He does yard work and walks on the treadmill occasionally. He had an episode of bradycardia after getting IV protocol for an endoscopy procedure. He was seen by [name]. His metoprolol dose was decreased. He has also had a bronchoscopy with [name]. He does not have any evidence of COPD.  Both he and his wife have stop smoking.  ( Aug 20, 2011)  Feb. 28, 2014:  He continues to have increasing chest pain.  He has not been smoking.  The pains are associated with exertion, last 5-10 minutes, resolve with 1 SL NTG.   He's had a mildly elevated TSH for the past year or so and has had some fatigue.    his TSH level 6.8.      His medical doctor started him on Synthroid. He gradually has titrated up to 0.1 mg a day. Since that time he's had increasing episodes of chest pain.  Aug 29, 2012:  [name] saw him in February we performed a cardiac catheterization because of increasing chest pain. He was found to have sequential stenosis in his mid right coronary artery and had PCI of his right coronary artery ( 2.5 x 38 mm Promus Premier DES in the distal RCA and a 2.75 x 38 mm Promus Premier DES in the mid vessel in an overlapping fashion. The  distal sten was post-dilated with a 2.5 x 20 mm  balloon. The proximal portion of the stent was post-dilated with a 3.0 x 20 mm  balloon)  He and his wife are concerned about whether or not the Plavix is active.  His generic plavix manufacturer has changed.   He would like to be tested.   October 14, 2012:  Cayse has been having some occasional CP.  He has had some episodes of dyspnea while climbing up a hill playing golf.  This past Monday night, he had some chest pain - possible due to indigestions.  these  He had lost his NTG - he has refilled it now.    Nov. 10, 2014:  [name] is doing well.  He is working too much according to wife. he's had a few CP but not severe enough to take NTG.  Still has some dyspnea with climbing steps.   He had stenting in march 2014.  October 23, 2013:  [name] is doing well.   Playing golf.  Working in the yard.  Not exercising as much.   He has lost about 6 lbs.  He had labs drawn today.     Jan. 7, 2016:  He has been  waking up in the middle of the night and feels like he cant catch his  Breath. Still working out - on the treadmill twice this week and has done well.   Sept. 1, 2016:  Has had some short of breath. Does not get any exercise.  His CP is much better since starting CPAP .  Is having bloating  - thinks its due to the lipitor   July 19, 2015: Has had some shortness of breath.   With exertion . Happens occasionally but seem to be getting more frequently .  BP is elevated today  Has cut back on his salt .    Current Outpatient Prescriptions on File Prior to Visit  Medication Sig Dispense Refill  . aspirin EC 81 MG tablet Take 81 mg by mouth daily.    Marland Kitchen atorvastatin (LIPITOR) 80 MG tablet TAKE 1 TABLET DAILY 90 tablet 0  . clopidogrel (PLAVIX) 75 MG tablet TAKE 1 TABLET (75 MG TOTAL) BY MOUTH DAILY. 90 tablet 2  . famotidine (PEPCID) 20 MG tablet Take 20 mg by mouth daily as needed for heartburn.    . hydrochlorothiazide (HYDRODIURIL)  25 MG tablet TAKE 0.5 TABLETS (12.5 MG TOTAL) BY MOUTH DAILY. 45 tablet 2  . metoprolol tartrate (LOPRESSOR) 25 MG tablet TAKE 1 TABLET (25 MG TOTAL) BY MOUTH 2 (TWO) TIMES DAILY. 180 tablet 2  . naproxen sodium (ANAPROX) 220 MG tablet Take 220 mg by mouth daily as needed (headache).    . nitroGLYCERIN (NITROSTAT) 0.4 MG SL tablet Place 1 tablet (0.4 mg total) under the tongue every 5 (five) minutes as needed for chest pain. 25 tablet 6  . RABEprazole (ACIPHEX) 20 MG tablet Take 2 tablets (40 mg total) by mouth daily. 60 tablet 11  . Choline Fenofibrate 135 MG capsule Take 1 capsule (135 mg total) by mouth daily. (Patient not taking: Reported on 07/19/2015) 90 capsule 3   No current facility-administered medications on file prior to visit.    Allergies  Allergen Reactions  . Lisinopril Cough and Rash  . Amlodipine Besylate Other (See Comments)    Headache, weakness, dizziness  . Propofol Other (See Comments)    Heart rate dropped  . Rosuvastatin Other (See Comments)     muscle aches    Past Medical History  Diagnosis Date  . CAD (coronary artery disease)     moderate to severe distal CAD with large epicardial arteries having only mild to moderate irregularities. Last cath in Dec 2006; s/p cath with Promus DES in distal RCA and Promus DES in the mid vessel in an overlapping fashion  . Chest pain   . HTN (hypertension)   . HLD (hyperlipidemia)   . GERD (gastroesophageal reflux disease)   . COPD (chronic obstructive pulmonary disease) ([place])   . Hiatal hernia   . External hemorrhoids without mention of complication   . Headache(784.0)   . Nephrolithiasis   . ED (erectile dysfunction)   . Personal history of colonic polyps 03/12/2010    TUBULAR ADENOMA  . Esophageal stricture   . Tobacco abuse   . AV block, Mobitz 1     noted on EKG March 2013    Past Surgical History  Procedure Laterality Date  . Angioplasty      X3  . Video bronchoscopy  08/13/2011    Procedure: VIDEO  BRONCHOSCOPY WITHOUT FLUORO;  Surgeon: Tanda Rockers, MD;  Location: [facility];  Service: Cardiopulmonary;  Laterality: Bilateral;  . Percutaneous coronary rotoblator intervention (pci-r)  N/A 06/30/2012    Procedure: PERCUTANEOUS CORONARY ROTOBLATOR INTERVENTION (PCI-R);  Surgeon: Burnell Blanks, MD;  Location: [facility] CATH LAB;  Service: Cardiovascular;  Laterality: N/A;    History  Smoking status  . Former Smoker -- 0.50 packs/day for 40 years  . Types: Cigarettes  . Quit date: 08/25/2011  Smokeless tobacco  . Never Used    History  Alcohol Use  . 0.0 oz/week    Comment: rare    Family History  Problem Relation Age of Onset  . Heart disease Father     hx of MI  . Diabetes Mother     and brother   . Kidney failure Mother   . Colon cancer Neg Hx   . Stomach cancer Neg Hx   . Breast cancer Sister     Reviw of Systems:  Reviewed in the HPI.  All other systems are negative.  Physical Exam: Blood pressure 122/88, pulse 66, height 5' 10.5" (1.791 m), weight 188 lb (85.276 kg). General: Well developed, well nourished, in no acute distress. Head: Normocephalic, atraumatic, sclera non-icteric, mucus membranes are moist,  Neck: Supple. Carotids are 2 + without bruits. No JVD Lungs: Clear bilaterally to auscultation. Heart: regular rate.  normal  S1 S2. No murmurs, gallops or rubs. Abdomen: Soft, non-tender, non-distended with normal bowel sounds. No hepatomegaly. No rebound/guarding. No masses. Msk:  Strength and tone are normal Extremities: No clubbing or cyanosis. No edema.  Distal pedal pulses are 2+ and equal bilaterally. Neuro: Alert and oriented X 3. Moves all extremities spontaneously. Psych:  Responds to questions appropriately with a normal affect.  ECG: 07/19/2015: Normal sinus rhythm at 66. He has a first-degree block. 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 Dyspnea:  Unclear etiology at this point. He has been seen by  pulmonary and was no evidence of COPD. He does have a slight wheeze today. We will give him an albuterol HFA.  It's been years since his previous echocardiogram. We will repeat his echocardiogram. His symptoms do not sound like angina.  2. CAD - 2.5 x 38 mm Promus Premier DES in the distal RCA and a 2.75 x 38 mm Promus Premier DES in the mid vessel in an overlapping fashion. The distal sten was post-dilated with a 2.5 x 20 mm Clayton balloon. The proximal portion of the stent was post-dilated with a 3.0 x 20 mm [place] balloon ( June 30, 2012) No angina  His chest pains have improved significant since he was started on CPAP for his obstructive sleep apnea.  3.  Obstructive sleep apnea:    Sees Dr. Radford Pax - feeling better.   4. Hyperlipidemia: We will continue with current dose of atorvastatin .   He has stopped the Fenofibrate. Also has trouble with muscle aches if he takes atorvastatin daily for more than several weeks. Check labs today    Daria Mcmeekin, Wonda Cheng, MD  07/19/2015 9:36 AM    [facility],  [place], [place]  [address] Pager [phone] Phone: [phone]; Fax: [phone]   [facility]  [address] [place], [place]  [address] [phone]   Fax [phone]

## 2015-07-26 ENCOUNTER — Other Ambulatory Visit: Payer: Self-pay

## 2015-07-26 ENCOUNTER — Ambulatory Visit (INDEPENDENT_AMBULATORY_CARE_PROVIDER_SITE_OTHER): Payer: BLUE CROSS/BLUE SHIELD | Admitting: Cardiology

## 2015-07-26 ENCOUNTER — Encounter: Payer: Self-pay | Admitting: Cardiology

## 2015-07-26 ENCOUNTER — Ambulatory Visit (HOSPITAL_COMMUNITY): Payer: BLUE CROSS/BLUE SHIELD | Attending: Internal Medicine

## 2015-07-26 VITALS — BP 132/82 | HR 69 | Ht 70.0 in | Wt 186.8 lb

## 2015-07-26 DIAGNOSIS — G4733 Obstructive sleep apnea (adult) (pediatric): Secondary | ICD-10-CM

## 2015-07-26 DIAGNOSIS — J449 Chronic obstructive pulmonary disease, unspecified: Secondary | ICD-10-CM | POA: Insufficient documentation

## 2015-07-26 DIAGNOSIS — R0600 Dyspnea, unspecified: Secondary | ICD-10-CM

## 2015-07-26 DIAGNOSIS — I10 Essential (primary) hypertension: Secondary | ICD-10-CM

## 2015-07-26 DIAGNOSIS — I2510 Atherosclerotic heart disease of native coronary artery without angina pectoris: Secondary | ICD-10-CM | POA: Diagnosis not present

## 2015-07-26 DIAGNOSIS — E785 Hyperlipidemia, unspecified: Secondary | ICD-10-CM | POA: Insufficient documentation

## 2015-07-26 DIAGNOSIS — Z720 Tobacco use: Secondary | ICD-10-CM | POA: Insufficient documentation

## 2015-07-26 LAB — ECHOCARDIOGRAM COMPLETE
Height: 70 in
Weight: 2988.8 oz

## 2015-07-26 NOTE — Patient Instructions (Signed)
Medication Instructions:  None  Labwork: None  Testing/Procedures: None  Follow-Up: Your physician wants you to follow-up in: 1 year with Dr. Turner. You will receive a reminder letter in the mail two months in advance. If you don't receive a letter, please call our office to schedule the follow-up appointment.   Any Other Special Instructions Will Be Listed Below (If Applicable).    If you need a refill on your cardiac medications before your next appointment, please call your pharmacy.   

## 2015-07-26 NOTE — Progress Notes (Signed)
Cardiology Office Note    Date:  07/26/2015   ID:  [name], DOB Sep 11, 1950, MRN [id number]  PCP:  Sherrie Mustache, MD  Cardiologist:  Sueanne Margarita, MD   Chief Complaint  Patient presents with  . Sleep Apnea  . Hypertension    History of Present Illness:  Daniel Carey is a 65 y.o. male who presents for followup of sleep apnea. He has a history of snoring and excessive daytime fatigue but with an Epworth sleepiness scale of only 5. He underwent PSG showing severe OSA with an AHI of 51 events per hour, most occurring in the supine position in NREM sleep. He oxygen saturations dropped to 89% with respiratory events. He underwent CPAP titration to 10cm H2O. He now presents for followup. He is doing well with his CPAP but occasionally will feel like he is smothering and will have to take it off. He tolerates the full face mask.  Since starting the CPAP, he feels more rested when he wakes up and has less daytime sleepiness. His snoring has resolved is he uses the device. He has some nasal congestion.  He has some mouth dryness    Past Medical History  Diagnosis Date  . CAD (coronary artery disease)     moderate to severe distal CAD with large epicardial arteries having only mild to moderate irregularities. Last cath in Dec 2006; s/p cath with Promus DES in distal RCA and Promus DES in the mid vessel in an overlapping fashion  . Chest pain   . HTN (hypertension)   . HLD (hyperlipidemia)   . GERD (gastroesophageal reflux disease)   . COPD (chronic obstructive pulmonary disease) ([place])   . Hiatal hernia   . External hemorrhoids without mention of complication   . Headache(784.0)   . Nephrolithiasis   . ED (erectile dysfunction)   . Personal history of colonic polyps 03/12/2010    TUBULAR ADENOMA  . Esophageal stricture   . Tobacco abuse   . AV block, Mobitz 1     noted on EKG March 2013    Past Surgical History  Procedure Laterality Date  . Angioplasty        X3  . Video bronchoscopy  08/13/2011    Procedure: VIDEO BRONCHOSCOPY WITHOUT FLUORO;  Surgeon: Tanda Rockers, MD;  Location: [facility];  Service: Cardiopulmonary;  Laterality: Bilateral;  . Percutaneous coronary rotoblator intervention (pci-r) N/A 06/30/2012    Procedure: PERCUTANEOUS CORONARY ROTOBLATOR INTERVENTION (PCI-R);  Surgeon: Burnell Blanks, MD;  Location: [facility] CATH LAB;  Service: Cardiovascular;  Laterality: N/A;    Current Medications: Outpatient Prescriptions Prior to Visit  Medication Sig Dispense Refill  . albuterol (PROVENTIL HFA;VENTOLIN HFA) 108 (90 Base) MCG/ACT inhaler Inhale 2 puffs into the lungs every 6 (six) hours as needed for wheezing or shortness of breath. 1 Inhaler 2  . aspirin EC 81 MG tablet Take 81 mg by mouth daily.    Marland Kitchen atorvastatin (LIPITOR) 80 MG tablet TAKE 1 TABLET DAILY 90 tablet 0  . Choline Fenofibrate 135 MG capsule Take 1 capsule (135 mg total) by mouth daily. 90 capsule 3  . clopidogrel (PLAVIX) 75 MG tablet TAKE 1 TABLET (75 MG TOTAL) BY MOUTH DAILY. 90 tablet 2  . famotidine (PEPCID) 20 MG tablet Take 20 mg by mouth daily as needed for heartburn.    . hydrochlorothiazide (HYDRODIURIL) 25 MG tablet TAKE 0.5 TABLETS (12.5 MG TOTAL) BY MOUTH DAILY. 45 tablet 2  . metoprolol tartrate (LOPRESSOR) 25 MG  tablet TAKE 1 TABLET (25 MG TOTAL) BY MOUTH 2 (TWO) TIMES DAILY. 180 tablet 2  . naproxen sodium (ANAPROX) 220 MG tablet Take 220 mg by mouth daily as needed (headache).    . nitroGLYCERIN (NITROSTAT) 0.4 MG SL tablet Place 1 tablet (0.4 mg total) under the tongue every 5 (five) minutes as needed for chest pain. 25 tablet 6  . RABEprazole (ACIPHEX) 20 MG tablet Take 2 tablets (40 mg total) by mouth daily. 60 tablet 11   No facility-administered medications prior to visit.     Allergies:   Lisinopril; Amlodipine besylate; Propofol; and Rosuvastatin   Social History   Social History  . Marital Status: Married    Spouse Name: N/A  .  Number of Children: 1  . 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 Smoking status: Former Smoker -- 0.50 packs/day for 40 years    Types: Cigarettes    Quit date: 08/25/2011  . Smokeless tobacco: Never Used  . Alcohol Use: 0.0 oz/week     Comment: rare  . Drug Use: No  . Sexual Activity: Not Currently   Other Topics Concern  . None   Social History Narrative     Family History:  The patient's family history includes Breast cancer in his sister; Diabetes in his mother; Heart disease in his father; Kidney failure in his mother. There is no history of Colon cancer or Stomach cancer.   ROS:   Please see the history of present illness.    ROS All other systems reviewed and are negative.   PHYSICAL EXAM:   VS:  BP 132/82 mmHg  Pulse 69  Ht 5\' 10"  (1.778 m)  Wt 186 lb 12.8 oz (84.732 kg)  BMI 26.80 kg/m2   GEN: Well nourished, well developed, in no acute distress HEENT: normal Neck: no JVD, carotid bruits, or masses Cardiac: RRR; no murmurs, rubs, or gallops,no edema.  Intact distal pulses bilaterally.  Respiratory:  clear to auscultation bilaterally, normal work of breathing GI: soft, nontender, nondistended, + BS MS: no deformity or atrophy Skin: warm and dry, no rash Neuro:  Alert and Oriented x 3, Strength and sensation are intact Psych: euthymic mood, full affect  Wt Readings from Last 3 Encounters:  07/26/15 186 lb 12.8 oz (84.732 kg)  07/19/15 188 lb (85.276 kg)  05/07/15 188 lb 6.4 oz (85.458 kg)      Studies/Labs Reviewed:   EKG:  EKG was not ordered today.    Recent Labs: 05/07/2015: TSH 0.23* 07/19/2015: ALT 21; BUN 19; Creat 0.93; Potassium 4.5; Sodium 140   Lipid Panel    Component Value Date/Time   CHOL 169 07/19/2015 0827   TRIG 295* 07/19/2015 0827   HDL 39* 07/19/2015 0827   CHOLHDL 4.3 07/19/2015 0827   VLDL 59* 07/19/2015 0827   LDLCALC 71 07/19/2015 0827   LDLDIRECT 107.0  02/27/2013 0904    Additional studies/ records that were reviewed today include:  CPAP download    ASSESSMENT:    1. Obstructive sleep apnea   2. Essential hypertension      PLAN:  In order of problems listed above:  1. OSA - he is doing well and tolerating his CPAP.  His d/l today showed an AHI of 7.7/hr on 10cm H2O abnd only 43% compliance in using more than 4 hours nightly.  I have recommended that we do a 2 week autotitration from 4 to  20cm H2O to determine what pressure he should be set at.  Patient has been using and benefiting from CPAP use and will continue to benefit from therapy.  2. HTN - BP controlled on current regimen.  Continue BB/HCTZ  Followup with me in 1 year    Medication Adjustments/Labs and Tests Ordered: Current medicines are reviewed at length with the patient today.  Concerns regarding medicines are outlined above.  Medication changes, Labs and Tests ordered today are listed in the Patient Instructions below. There are no Patient Instructions on file for this visit.   Lurena Nida, MD  07/26/2015 11:26 AM    [facility] [place], [place], [place]  [address] Phone: [phone]; Fax: [phone]

## 2015-07-26 NOTE — Addendum Note (Signed)
Addended by: Andres Ege on: 07/26/2015 02:55 PM   Modules accepted: Orders

## 2015-08-05 ENCOUNTER — Other Ambulatory Visit: Payer: Self-pay | Admitting: Cardiovascular Disease

## 2015-08-08 ENCOUNTER — Other Ambulatory Visit (HOSPITAL_COMMUNITY): Payer: BLUE CROSS/BLUE SHIELD

## 2015-09-05 ENCOUNTER — Encounter: Payer: Self-pay | Admitting: Internal Medicine

## 2015-09-05 ENCOUNTER — Ambulatory Visit (INDEPENDENT_AMBULATORY_CARE_PROVIDER_SITE_OTHER): Payer: BLUE CROSS/BLUE SHIELD | Admitting: Internal Medicine

## 2015-09-05 VITALS — BP 140/86 | HR 64 | Temp 97.8°F | Wt 186.0 lb

## 2015-09-05 DIAGNOSIS — E119 Type 2 diabetes mellitus without complications: Secondary | ICD-10-CM

## 2015-09-05 DIAGNOSIS — E039 Hypothyroidism, unspecified: Secondary | ICD-10-CM | POA: Diagnosis not present

## 2015-09-05 LAB — T4, FREE: Free T4: 0.66 ng/dL (ref 0.60–1.60)

## 2015-09-05 LAB — HEMOGLOBIN A1C: Hgb A1c MFr Bld: 7.1 % — ABNORMAL HIGH (ref 4.6–6.5)

## 2015-09-05 LAB — T3, FREE: T3, Free: 3.2 pg/mL (ref 2.3–4.2)

## 2015-09-05 LAB — TSH: TSH: 9.84 u[IU]/mL — ABNORMAL HIGH (ref 0.35–4.50)

## 2015-09-05 NOTE — Progress Notes (Signed)
Patient ID: Daniel Carey, male   DOB: 06-09-1950, 65 y.o.   MRN: [id number]   HPI  Daniel Carey is a 65 y.o.-year-old male, returning for f/u for hypothyroidism and DM2, diet-controlled, non-insulin-dependent. Last visit 4 mo ago. He is here with his wife who offers part of the history.  Pt. has been dx with hypothyroidism in 2012; was on Levothyroxine starting low and increasing up to 200 mcg (!) last year >> did not feel well >> stopped.   Wr are following him w/o medication as TFTs fluctuating but they are in or close to the ULN or LLN.  I reviewed pt's thyroid tests: Lab Results  Component Value Date   TSH 0.23* 05/07/2015   TSH 2.49 08/02/2014   TSH 4.695* 10/26/2013   TSH 1.56 06/08/2012   TSH 6.83* 07/20/2011   TSH 5.61* 07/14/2011   TSH 5.53* 12/05/2010   TSH 3.07 02/13/2010   FREET4 1.25 05/07/2015   FREET4 0.79 08/02/2014    Pt denies feeling nodules in neck, hoarseness, dysphagia/odynophagia, SOB with lying down.  Pt describes: - no cold intolerance - no weight loss  - no fatigue - no constipation - no dry skin - no hair falling - no depression  He sees Dr Acie Fredrickson >> had angioplasy 3 times in the past, 2 stents in last year. He had an AMI 1996 (mild). He has a Mobitz 1 AVB.  DM2: - last HbA1c higher after the Holidays: Lab Results  Component Value Date   HGBA1C 6.7* 05/07/2015   HGBA1C 6.1 11/08/2014   HGBA1C 6.7* 08/02/2014  With 2 levels higher than 6.5%, he does have a diagnosis of diabetes.  I advised him to check sugars at home >> 100-140.    He stays active playing golf, and also watches his diet.  He has OSA >> wears a C-pap.   ROS: Constitutional: no weight gain/loss, no fatigue, no subjective hyperthermia/hypothermia Eyes: no blurry vision, no xerophthalmia ENT: no sore throat, no nodules palpated in throat, no dysphagia/odynophagia, no hoarseness Cardiovascular: no CP/SOB/palpitations/leg swelling Respiratory: no  cough/SOB Gastrointestinal: no N/V/D/C/heartburn Musculoskeletal: no muscle/joint aches Skin: no rashes Neurological: no tremors/numbness/tingling/dizziness  I reviewed pt's medications, allergies, PMH, social hx, family hx, and changes were documented in the history of present illness. Otherwise, unchanged from my initial visit note:  Past Medical History  Diagnosis Date  . CAD (coronary artery disease)     moderate to severe distal CAD with large epicardial arteries having only mild to moderate irregularities. Last cath in Dec 2006; s/p cath with Promus DES in distal RCA and Promus DES in the mid vessel in an overlapping fashion  . Chest pain   . HTN (hypertension)   . HLD (hyperlipidemia)   . GERD (gastroesophageal reflux disease)   . COPD (chronic obstructive pulmonary disease) ([place])   . Hiatal hernia   . External hemorrhoids without mention of complication   . Headache(784.0)   . Nephrolithiasis   . ED (erectile dysfunction)   . Personal history of colonic polyps 03/12/2010    TUBULAR ADENOMA  . Esophageal stricture   . Tobacco abuse   . AV block, Mobitz 1     noted on EKG March 2013   Past Surgical History  Procedure Laterality Date  . Angioplasty      X3  . Video bronchoscopy  08/13/2011    Procedure: VIDEO BRONCHOSCOPY WITHOUT FLUORO;  Surgeon: Tanda Rockers, MD;  Location: [facility];  Service: Cardiopulmonary;  Laterality: Bilateral;  .  Percutaneous coronary rotoblator intervention (pci-r) N/A 06/30/2012    Procedure: PERCUTANEOUS CORONARY ROTOBLATOR INTERVENTION (PCI-R);  Surgeon: Burnell Blanks, MD;  Location: [facility] CATH LAB;  Service: Cardiovascular;  Laterality: N/A;   History   Social History  . Marital Status: Married    Spouse Name: N/A    Number of Children: , 65 y/o   Occupational History  . [employer] manager    Social History Main Topics  . Smoking status: Former Smoker -- 0.50 packs/day for 40 years    Types: Cigarettes    Quit date:  08/25/2011  . Smokeless tobacco: Never Used  . Alcohol Use: 0.0 oz/week     Comment: rare  . Drug Use: No   Current Outpatient Prescriptions on File Prior to Visit  Medication Sig Dispense Refill  . albuterol (PROVENTIL HFA;VENTOLIN HFA) 108 (90 Base) MCG/ACT inhaler Inhale 2 puffs into the lungs every 6 (six) hours as needed for wheezing or shortness of breath. 1 Inhaler 2  . aspirin EC 81 MG tablet Take 81 mg by mouth daily.    Marland Kitchen atorvastatin (LIPITOR) 80 MG tablet Take 1 tablet (80 mg total) by mouth daily. 90 tablet 3  . Choline Fenofibrate 135 MG capsule Take 1 capsule (135 mg total) by mouth daily. 90 capsule 3  . clopidogrel (PLAVIX) 75 MG tablet TAKE 1 TABLET (75 MG TOTAL) BY MOUTH DAILY. 90 tablet 2  . famotidine (PEPCID) 20 MG tablet Take 20 mg by mouth daily as needed for heartburn.    . hydrochlorothiazide (HYDRODIURIL) 25 MG tablet TAKE 0.5 TABLETS (12.5 MG TOTAL) BY MOUTH DAILY. 45 tablet 2  . metoprolol tartrate (LOPRESSOR) 25 MG tablet TAKE 1 TABLET (25 MG TOTAL) BY MOUTH 2 (TWO) TIMES DAILY. 180 tablet 2  . naproxen sodium (ANAPROX) 220 MG tablet Take 220 mg by mouth daily as needed (headache).    . nitroGLYCERIN (NITROSTAT) 0.4 MG SL tablet Place 1 tablet (0.4 mg total) under the tongue every 5 (five) minutes as needed for chest pain. 25 tablet 6  . RABEprazole (ACIPHEX) 20 MG tablet Take 2 tablets (40 mg total) by mouth daily. 60 tablet 11   No current facility-administered medications on file prior to visit.   Allergies  Allergen Reactions  . Lisinopril Cough and Rash  . Amlodipine Besylate Other (See Comments)    Headache, weakness, dizziness  . Propofol Other (See Comments)    Heart rate dropped  . Rosuvastatin Other (See Comments)     muscle aches   Family History  Problem Relation Age of Onset  . Heart disease Father     hx of MI  . Diabetes Mother     and brother   . Kidney failure Mother   . Colon cancer Neg Hx   . Stomach cancer Neg Hx   . Breast  cancer Sister    PE: BP 140/86 mmHg  Pulse 64  Temp(Src) 97.8 F (36.6 C)  Wt 186 lb (84.369 kg)  SpO2 96% Wt Readings from Last 3 Encounters:  09/05/15 186 lb (84.369 kg)  07/26/15 186 lb 12.8 oz (84.732 kg)  07/19/15 188 lb (85.276 kg)   Constitutional: slightly overweight, in NAD Eyes: PERRLA, EOMI, no exophthalmos ENT: moist mucous membranes, no thyromegaly, no cervical lymphadenopathy Cardiovascular: RRR, No MRG Respiratory: CTA B Gastrointestinal: abdomen soft, NT, ND, BS+ Musculoskeletal: no deformities, strength intact in all 4 Skin: moist, warm, no rashes Neurological: no tremor with outstretched hands, DTR normal in all 4  ASSESSMENT:  1. Mild hypothyroidism  2. DM2, controlled, not on medication  PLAN:  1. Patient with few years h/o mild hypothyroidism, not on levothyroxine therapy. He appears euthyroid. He does not appear to have a goiter, thyroid nodules, or neck compression symptoms - we reviewed his TFTs from last visits >> last TSH slightly low (no exogenous causes), prev. Visits have been slightly higher close to the ULN - He is very reticent to start unnecessary meds and would like to avoid Levothyroxine due to previous experience with the med. (He probably had an intolerance to Levothyroxine before 2/2 the high dose used). - Will check TFTs today. If TSH raises above 10 or he has sxs of hypothyroidism, we can start 25 mcg Levothyroxine.  - Return in about 6 months (around 03/07/2016).   2. DM2, controlled - uncomplicated - elevated 0000000 of 6.7% at last visit, but this was after the Holidays - recheck HbA1c today - He is reticent to start on any medication if not absolutely necessary - he is worried that his Lipitor may increase sugars >> I advised him to discuss with Dr Acie Fredrickson whether a switch to Livalo (Pitavastatin) would be an option, as this does not elevated and can even lower the [place]  Office Visit on 09/05/2015  Component Date Value Ref Range  Status  . Free T4 09/05/2015 0.66  0.60 - 1.60 ng/dL Final  . TSH 09/05/2015 9.84* 0.35 - 4.50 uIU/mL Final  . T3, Free 09/05/2015 3.2  2.3 - 4.2 pg/mL Final  . Hgb A1c MFr Bld 09/05/2015 7.1* 4.6 - 6.5 % Final   Glycemic Control Guidelines for People with Diabetes:Non Diabetic:  <6%Goal of Therapy: <7%Additional Action Suggested:  >8%    TSH and HbA1c have increased. Since he feels well and has no complaints and his TSH is lower than 10 with normal free T4 and free T3, we can continue to watch the TFTs without medicines. I will recheck them in 6 months. HbA1c higher. I'll advise the patient to start writing down his sugars and bring them at next visit. I believe switching from Lipitor to Livalo would help.

## 2015-09-05 NOTE — Patient Instructions (Signed)
Please stop at the lab.  Please discuss with Dr. Acie Fredrickson whether Chanda Busing would be a good replacement for Lipitor.  Please return in 6 months.

## 2015-09-20 ENCOUNTER — Encounter: Payer: Self-pay | Admitting: Gastroenterology

## 2016-01-09 ENCOUNTER — Encounter: Payer: Self-pay | Admitting: Cardiovascular Disease

## 2016-01-09 ENCOUNTER — Other Ambulatory Visit: Payer: BLUE CROSS/BLUE SHIELD

## 2016-01-09 ENCOUNTER — Ambulatory Visit (INDEPENDENT_AMBULATORY_CARE_PROVIDER_SITE_OTHER): Payer: BLUE CROSS/BLUE SHIELD | Admitting: Cardiovascular Disease

## 2016-01-09 VITALS — BP 140/80 | HR 64 | Ht 71.0 in | Wt 184.2 lb

## 2016-01-09 DIAGNOSIS — I2510 Atherosclerotic heart disease of native coronary artery without angina pectoris: Secondary | ICD-10-CM | POA: Diagnosis not present

## 2016-01-09 LAB — COMPREHENSIVE METABOLIC PANEL
ALT: 14 U/L (ref 9–46)
AST: 13 U/L (ref 10–35)
Albumin: 4.1 g/dL (ref 3.6–5.1)
Alkaline Phosphatase: 52 U/L (ref 40–115)
BUN: 18 mg/dL (ref 7–25)
CO2: 26 mmol/L (ref 20–31)
Calcium: 9.7 mg/dL (ref 8.6–10.3)
Chloride: 102 mmol/L (ref 98–110)
Creat: 1.06 mg/dL (ref 0.70–1.25)
Glucose, Bld: 133 mg/dL — ABNORMAL HIGH (ref 65–99)
Potassium: 4.9 mmol/L (ref 3.5–5.3)
Sodium: 137 mmol/L (ref 135–146)
Total Bilirubin: 0.7 mg/dL (ref 0.2–1.2)
Total Protein: 7.2 g/dL (ref 6.1–8.1)

## 2016-01-09 LAB — LIPID PANEL
Cholesterol: 168 mg/dL (ref 125–200)
HDL: 54 mg/dL (ref >=40)
LDL Cholesterol: 88 mg/dL (ref <130)
Total CHOL/HDL Ratio: 3.1 Ratio (ref <=5.0)
Triglycerides: 130 mg/dL (ref <150)
VLDL: 26 mg/dL (ref <30)

## 2016-01-09 MED ORDER — HYDROCHLOROTHIAZIDE 25 MG PO TABS: 12.5000 mg | ORAL_TABLET | Freq: Every day | ORAL | 3 refills | Status: DC

## 2016-01-09 MED ORDER — ATORVASTATIN CALCIUM 80 MG PO TABS: 80.0000 mg | ORAL_TABLET | Freq: Every day | ORAL | 3 refills | Status: DC

## 2016-01-09 MED ORDER — CLOPIDOGREL BISULFATE 75 MG PO TABS: 75.0000 mg | ORAL_TABLET | Freq: Once | ORAL | 3 refills | Status: DC

## 2016-01-09 MED ORDER — METOPROLOL TARTRATE 25 MG PO TABS: 25.0000 mg | ORAL_TABLET | Freq: Two times a day (BID) | ORAL | 3 refills | Status: DC

## 2016-01-09 NOTE — Patient Instructions (Signed)
Medication Instructions:  Your physician recommends that you continue on your current medications as directed. Please refer to the Current Medication list given to you today.   Labwork: TODAY - cholesterol, complete metabolic panel   Testing/Procedures: None Ordered   Follow-Up: Your physician wants you to follow-up in: 6 months with Dr. Nahser.  You will receive a reminder letter in the mail two months in advance. If you don't receive a letter, please call our office to schedule the follow-up appointment.   If you need a refill on your cardiac medications before your next appointment, please call your pharmacy.   Thank you for choosing [facility]! Michelle Swinyer, RN [phone]    

## 2016-01-09 NOTE — Progress Notes (Signed)
[name] Date of Birth  1950/07/04       [facility] [address], [place], [place]  [address]   [place], [place]  [address] [phone]     [phone]   Fax  [phone]    Fax [phone]  Problem List: 1. CAD - 2.5 x 38 mm Promus Premier DES in the distal RCA and a 2.75 x 38 mm Promus Premier DES in the mid vessel in an overlapping fashion. The distal sten was post-dilated with a 2.5 x 20 mm [place] balloon. The proximal portion of the stent was post-dilated with a 3.0 x 20 mm [place] balloon ( June 30, 2012)  2. Bradycardia after getting propafol for endoscopy. 3. Obstructive sleep apnea:     History of Present Illness:  Daniel Carey is feeling well today.  He's been getting some exercise. He does yard work and walks on the treadmill occasionally. He had an episode of bradycardia after getting IV protocol for an endoscopy procedure. He was seen by Daniel Carey. His metoprolol dose was decreased. He has also had a bronchoscopy with Dr. Melvyn Carey. He does not have any evidence of COPD.  Both he and his wife have stop smoking.  ( Aug 20, 2011)  Feb. 28, 2014:  He continues to have increasing chest pain.  He has not been smoking.  The pains are associated with exertion, last 5-10 minutes, resolve with 1 SL NTG.   He's had a mildly elevated TSH for the past year or so and has had some fatigue.    his TSH level 6.8.      His medical doctor started him on Synthroid. He gradually has titrated up to 0.1 mg a day. Since that time he's had increasing episodes of chest pain.  Aug 29, 2012:  Daniel Carey saw him in February we performed a cardiac catheterization because of increasing chest pain. He was found to have sequential stenosis in his mid right coronary artery and had PCI of his right coronary artery ( 2.5 x 38 mm Promus Premier DES in the distal RCA and a 2.75 x 38 mm Promus Premier DES in the mid vessel in an overlapping fashion. The  distal sten was post-dilated with a 2.5 x 20 mm [place] balloon. The proximal portion of the stent was post-dilated with a 3.0 x 20 mm  balloon)  He and his wife are concerned about whether or not the Plavix is active.  His generic plavix manufacturer has changed.   He would like to be tested.   October 14, 2012:  Daniel Carey has been having some occasional CP.  He has had some episodes of dyspnea while climbing up a hill playing golf.  This past Monday night, he had some chest pain - possible due to indigestions.  these  He had lost his NTG - he has refilled it now.    Nov. 10, 2014:  Daniel Carey is doing well.  He is working too much according to wife. he's had a few CP but not severe enough to take NTG.  Still has some dyspnea with climbing steps.   He had stenting in march 2014.  October 23, 2013:  Daniel Carey is doing well.   Playing golf.  Working in the yard.  Not exercising as much.   He has lost about 6 lbs.  He had labs drawn today.     Jan. 7, 2016:  He has been  waking up in the middle of the night and feels like he cant catch his  Breath. Still working out - on the treadmill twice this week and has done well.   Sept. 1, 2016:  Has had some short of breath. Does not get any exercise.  His CP is much better since starting CPAP .  Is having bloating  - thinks its due to the lipitor   July 19, 2015: Has had some shortness of breath.   With exertion . Happens occasionally but seem to be getting more frequently .  BP is elevated today  Has cut back on his salt .   Sept. 21, 2017:  Daniel Carey is seen today for follow up visit for his CAD Occasionally short of breath . Comes and goes .  BP is a little elevated today Has occasional CP  Able to do all that he wants to do     Current Outpatient Prescriptions on File Prior to Visit  Medication Sig Dispense Refill  . albuterol (PROVENTIL HFA;VENTOLIN HFA) 108 (90 Base) MCG/ACT inhaler Inhale 2 puffs into the lungs every 6 (six) hours as needed for  wheezing or shortness of breath. 1 Inhaler 2  . aspirin EC 81 MG tablet Take 81 mg by mouth daily.    Marland Kitchen atorvastatin (LIPITOR) 80 MG tablet Take 1 tablet (80 mg total) by mouth daily. 90 tablet 3  . Choline Fenofibrate 135 MG capsule Take 1 capsule (135 mg total) by mouth daily. 90 capsule 3  . clopidogrel (PLAVIX) 75 MG tablet TAKE 1 TABLET (75 MG TOTAL) BY MOUTH DAILY. 90 tablet 2  . famotidine (PEPCID) 20 MG tablet Take 20 mg by mouth daily as needed for heartburn.    . hydrochlorothiazide (HYDRODIURIL) 25 MG tablet TAKE 0.5 TABLETS (12.5 MG TOTAL) BY MOUTH DAILY. 45 tablet 2  . metoprolol tartrate (LOPRESSOR) 25 MG tablet TAKE 1 TABLET (25 MG TOTAL) BY MOUTH 2 (TWO) TIMES DAILY. 180 tablet 2  . naproxen sodium (ANAPROX) 220 MG tablet Take 220 mg by mouth daily as needed (headache).    . nitroGLYCERIN (NITROSTAT) 0.4 MG SL tablet Place 1 tablet (0.4 mg total) under the tongue every 5 (five) minutes as needed for chest pain. 25 tablet 6  . RABEprazole (ACIPHEX) 20 MG tablet Take 2 tablets (40 mg total) by mouth daily. 60 tablet 11   No current facility-administered medications on file prior to visit.     Allergies  Allergen Reactions  . Lisinopril Cough and Rash  . Amlodipine Besylate Other (See Comments)    Headache, weakness, dizziness  . Propofol Other (See Comments)    Heart rate dropped  . Rosuvastatin Other (See Comments)     muscle aches    Past Medical History:  Diagnosis Date  . 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 CAD (coronary artery disease)    moderate to severe distal CAD with large epicardial arteries having only mild to moderate irregularities. Last cath in Dec 2006; s/p cath with Promus DES in distal RCA and Promus DES in the mid vessel in an overlapping fashion  . Chest pain   . COPD (chronic obstructive pulmonary disease) ([place])   . ED (erectile dysfunction)   . Esophageal stricture   . External hemorrhoids without mention of complication   . GERD  (gastroesophageal reflux disease)   . Headache(784.0)   . Hiatal hernia   . HLD (hyperlipidemia)   . HTN (hypertension)   .  Nephrolithiasis   . Personal history of colonic polyps 03/12/2010   TUBULAR ADENOMA  . Tobacco abuse     Past Surgical History:  Procedure Laterality Date  . ANGIOPLASTY     X3  . PERCUTANEOUS CORONARY ROTOBLATOR INTERVENTION (PCI-R) N/A 06/30/2012   Procedure: PERCUTANEOUS CORONARY ROTOBLATOR INTERVENTION (PCI-R);  Surgeon: Burnell Blanks, MD;  Location[facility] CATH LAB;  Service: Cardiovascular;  Laterality: N/A;  . VIDEO BRONCHOSCOPY  08/13/2011   Procedure: VIDEO BRONCHOSCOPY WITHOUT FLUORO;  Surgeon: Tanda Rockers, MD;  Location: [facility] ENDOSCOPY;  Service: Cardiopulmonary;  Laterality: Bilateral;    History  Smoking Status  . Former Smoker  . Packs/day: 0.50  . Years: 40.00  . Types: Cigarettes  . Quit date: 08/25/2011  Smokeless Tobacco  . Never Used    History  Alcohol Use  . 0.0 oz/week    Comment: rare    Family History  Problem Relation Age of Onset  . Heart disease Father     hx of MI  . Diabetes Mother     and brother   . Kidney failure Mother   . Colon cancer Neg Hx   . Stomach cancer Neg Hx   . Breast cancer Sister     Reviw of Systems:  Reviewed in the HPI.  All other systems are negative.  Physical Exam: Blood pressure (!) 150/104, pulse 64, height 5\' 11"  (1.803 m), weight 184 lb 3.2 oz (83.6 kg). General: Well developed, well nourished, in no acute distress. Head: Normocephalic, atraumatic, sclera non-icteric, mucus membranes are moist,  Neck: Supple. Carotids are 2 + without bruits. No JVD Lungs: Clear bilaterally to auscultation. Heart: regular rate.  normal  S1 S2. No murmurs, gallops or rubs. Abdomen: Soft, non-tender, non-distended with normal bowel sounds. No hepatomegaly. No rebound/guarding. No masses. Msk:  Strength and tone are normal Extremities: No clubbing or cyanosis. No edema.  Distal pedal pulses are 2+  and equal bilaterally. Neuro: Alert and oriented X 3. Moves all extremities spontaneously. Psych:  Responds to questions appropriately with a normal affect.  ECG: 07/19/2015: Normal sinus rhythm at 66. He has a first-degree block. 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 Dyspnea:  Unclear etiology at this point. He has been seen by pulmonary and was no evidence of COPD. It's been years since his previous echocardiogram. We will repeat his echocardiogram. His symptoms do not sound like angina.  2. CAD - 2.5 x 38 mm Promus Premier DES in the distal RCA and a 2.75 x 38 mm Promus Premier DES in the mid vessel in an overlapping fashion. The distal sten was post-dilated with a 2.5 x 20 mm [place] balloon. The proximal portion of the stent was post-dilated with a 3.0 x 20 mm [place] balloon ( June 30, 2012) No angina  His chest pains have improved significant since he was started on CPAP for his obstructive sleep apnea.  3.  Obstructive sleep apnea:    Sees Dr. Radford Pax - feeling better.   4. Hyperlipidemia: We will continue with current dose of atorvastatin .   He has stopped the Fenofibrate. Also has trouble with muscle aches if he takes atorvastatin daily for more than several weeks. Check labs today    Mertie Moores, MD  01/09/2016 11:10 AM    [facility] [place],  [place], [place]  [address] Pager [phone] Phone: [phone]; Fax: [phone]

## 2016-02-01 ENCOUNTER — Other Ambulatory Visit: Payer: Self-pay | Admitting: Cardiovascular Disease

## 2016-02-12 DIAGNOSIS — Z23 Encounter for immunization: Secondary | ICD-10-CM | POA: Diagnosis not present

## 2016-02-28 DIAGNOSIS — J309 Allergic rhinitis, unspecified: Secondary | ICD-10-CM | POA: Diagnosis not present

## 2016-02-28 DIAGNOSIS — I10 Essential (primary) hypertension: Secondary | ICD-10-CM | POA: Diagnosis not present

## 2016-02-28 DIAGNOSIS — K219 Gastro-esophageal reflux disease without esophagitis: Secondary | ICD-10-CM | POA: Diagnosis not present

## 2016-03-06 ENCOUNTER — Encounter: Payer: Self-pay | Admitting: Internal Medicine

## 2016-03-06 ENCOUNTER — Ambulatory Visit (INDEPENDENT_AMBULATORY_CARE_PROVIDER_SITE_OTHER): Payer: BLUE CROSS/BLUE SHIELD | Admitting: Internal Medicine

## 2016-03-06 VITALS — BP 108/80 | HR 64 | Wt 187.0 lb

## 2016-03-06 DIAGNOSIS — E119 Type 2 diabetes mellitus without complications: Secondary | ICD-10-CM | POA: Diagnosis not present

## 2016-03-06 DIAGNOSIS — E039 Hypothyroidism, unspecified: Secondary | ICD-10-CM

## 2016-03-06 LAB — T4, FREE: Free T4: 0.67 ng/dL (ref 0.60–1.60)

## 2016-03-06 LAB — TSH: TSH: 4.13 u[IU]/mL (ref 0.35–4.50)

## 2016-03-06 LAB — T3, FREE: T3, Free: 3.1 pg/mL (ref 2.3–4.2)

## 2016-03-06 NOTE — Progress Notes (Signed)
Patient ID: Daniel Carey, male   DOB: May 29, 1950, 65 y.o.   MRN: [id number]   HPI  Daniel Carey is a 65 y.o.-year-old male, returning for f/u for hypothyroidism and DM2, diet-controlled, non-insulin-dependent. Last visit 6 mo ago. He is here with his wife who offers part of the history.  Pt. has been dx with hypothyroidism in 2012; was on Levothyroxine starting low and increasing up to 200 mcg (!) last year >> did not feel well >> stopped.   We are following him w/o medication as TFTs fluctuating but they are in or close to the ULN or LLN. He would like to avoid medicines if possible.  I reviewed pt's thyroid tests: Lab Results  Component Value Date   TSH 9.84 (H) 09/05/2015   TSH 0.23 (L) 05/07/2015   TSH 2.49 08/02/2014   TSH 4.695 (H) 10/26/2013   TSH 1.56 06/08/2012   TSH 6.83 (H) 07/20/2011   TSH 5.61 (H) 07/14/2011   TSH 5.53 (H) 12/05/2010   TSH 3.07 02/13/2010   FREET4 0.66 09/05/2015   FREET4 1.25 05/07/2015   FREET4 0.79 08/02/2014    Pt denies feeling nodules in neck, hoarseness, dysphagia/odynophagia, SOB with lying down.  Pt describes: - no cold intolerance - no weight loss  - no fatigue - no constipation - no dry skin - no hair falling - no depression  He sees Dr Acie Fredrickson >> had angioplasy 3 times in the past, 2 stents in last year. He had an AMI 1996 (mild). He has a Mobitz 1 AVB.  DM2: - last HbA1c higher after the Holidays: Lab Results  Component Value Date   HGBA1C 7.1 (H) 09/05/2015   HGBA1C 6.7 (H) 05/07/2015   HGBA1C 6.1 11/08/2014   I advised him to check sugars at home >> checks seldom: 120-140.    Last BUN/Cr: Lab Results  Component Value Date   BUN 18 01/09/2016   BUN 19 07/19/2015   BUN 17 03/19/2015   CREATININE 1.06 01/09/2016   CREATININE 0.93 07/19/2015   CREATININE 1.00 03/19/2015   Last cholesterol level: Lab Results  Component Value Date   CHOL 168 01/09/2016   HDL 54 01/09/2016   LDLCALC 88 01/09/2016   LDLDIRECT  107.0 02/27/2013   TRIG 130 01/09/2016   CHOLHDL 3.1 01/09/2016   He had an eye exam in 09/2015. No. DR.  He stays active playing golf, and also watches his diet.  He has OSA >> wears a C-pap.   ROS: Constitutional: no weight gain/loss, no fatigue, no subjective hyperthermia/hypothermia Eyes: no blurry vision, no xerophthalmia ENT: no sore throat, no nodules palpated in throat, no dysphagia/odynophagia, no hoarseness Cardiovascular: no CP/SOB/palpitations/leg swelling Respiratory: no cough/SOB Gastrointestinal: no N/V/D/C/heartburn Musculoskeletal: no muscle/joint aches Skin: no rashes Neurological: no tremors/numbness/tingling/dizziness  I reviewed pt's medications, allergies, PMH, social hx, family hx, and changes were documented in the history of present illness. Otherwise, unchanged from my initial visit note:  Past Medical History:  Diagnosis Date  . 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 CAD (coronary artery disease)    moderate to severe distal CAD with large epicardial arteries having only mild to moderate irregularities. Last cath in Dec 2006; s/p cath with Promus DES in distal RCA and Promus DES in the mid vessel in an overlapping fashion  . Chest pain   . COPD (chronic obstructive pulmonary disease) ([place])   . ED (erectile dysfunction)   . Esophageal stricture   .  External hemorrhoids without mention of complication   . GERD (gastroesophageal reflux disease)   . Headache(784.0)   . Hiatal hernia   . HLD (hyperlipidemia)   . HTN (hypertension)   . Nephrolithiasis   . Personal history of colonic polyps 03/12/2010   TUBULAR ADENOMA  . Tobacco abuse    Past Surgical History:  Procedure Laterality Date  . ANGIOPLASTY     X3  . PERCUTANEOUS CORONARY ROTOBLATOR INTERVENTION (PCI-R) N/A 06/30/2012   Procedure: PERCUTANEOUS CORONARY ROTOBLATOR INTERVENTION (PCI-R);  Surgeon: Burnell Blanks, MD;  Location: [facility] CATH LAB;  Service: Cardiovascular;   Laterality: N/A;  . VIDEO BRONCHOSCOPY  08/13/2011   Procedure: VIDEO BRONCHOSCOPY WITHOUT FLUORO;  Surgeon: Tanda Rockers, MD;  Location: [facility] ENDOSCOPY;  Service: Cardiopulmonary;  Laterality: Bilateral;   History   Social History  . 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 Smoking status: Former Smoker -- 0.50 packs/day for 40 years    Types: Cigarettes    Quit date: 08/25/2011  . Smokeless tobacco: Never Used  . Alcohol Use: 0.0 oz/week     Comment: rare  . Drug Use: No   Current Outpatient Prescriptions on File Prior to Visit  Medication Sig Dispense Refill  . albuterol (PROVENTIL HFA;VENTOLIN HFA) 108 (90 Base) MCG/ACT inhaler Inhale 2 puffs into the lungs every 6 (six) hours as needed for wheezing or shortness of breath. 1 Inhaler 2  . aspirin EC 81 MG tablet Take 81 mg by mouth daily.    Marland Kitchen atorvastatin (LIPITOR) 80 MG tablet Take 1 tablet (80 mg total) by mouth daily. 90 tablet 3  . Choline Fenofibrate 135 MG capsule Take 1 capsule (135 mg total) by mouth daily. 90 capsule 3  . famotidine (PEPCID) 20 MG tablet Take 20 mg by mouth daily as needed for heartburn.    . hydrochlorothiazide (HYDRODIURIL) 25 MG tablet Take 0.5 tablets (12.5 mg total) by mouth daily. 45 tablet 3  . metoprolol tartrate (LOPRESSOR) 25 MG tablet Take 1 tablet (25 mg total) by mouth 2 (two) times daily. 180 tablet 3  . naproxen sodium (ANAPROX) 220 MG tablet Take 220 mg by mouth daily as needed (headache).    Marland Kitchen NITROSTAT 0.4 MG SL tablet PLACE 1 TABLET (0.4 MG TOTAL) UNDER THE TONGUE EVERY 5 (FIVE) MINUTES AS NEEDED FOR CHEST PAIN. 25 tablet 4  . RABEprazole (ACIPHEX) 20 MG tablet Take 2 tablets (40 mg total) by mouth daily. 60 tablet 11   No current facility-administered medications on file prior to visit.    Allergies  Allergen Reactions  . Lisinopril Cough and Rash  . Amlodipine  Besylate Other (See Comments)    Headache, weakness, dizziness  . Propofol Other (See Comments)    Heart rate dropped  . Rosuvastatin Other (See Comments)     muscle aches   Family History  Problem Relation Age of Onset  . Heart disease Father     hx of MI  . Diabetes Mother     and brother   . Kidney failure Mother   . Colon cancer Neg Hx   . Stomach cancer Neg Hx   . Breast cancer Sister    PE: BP 108/80   Pulse 64   Wt 187 lb (84.8 kg)   SpO2 94%   BMI 26.08 kg/m  Wt Readings  from Last 3 Encounters:  03/06/16 187 lb (84.8 kg)  01/09/16 184 lb 3.2 oz (83.6 kg)  09/05/15 186 lb (84.4 kg)   Constitutional: slightly overweight, in NAD Eyes: PERRLA, EOMI, no exophthalmos ENT: moist mucous membranes, no thyromegaly, no cervical lymphadenopathy Cardiovascular: RRR, No MRG Respiratory: CTA B Gastrointestinal: abdomen soft, NT, ND, BS+ Musculoskeletal: no deformities, strength intact in all 4 Skin: moist, warm, no rashes Neurological: no tremor with outstretched hands, DTR normal in all 4  ASSESSMENT: 1. Mild hypothyroidism  2. DM2, controlled, not on medication  PLAN:  1. Patient with few years h/o mild hypothyroidism, off levothyroxine therapy. He appears euthyroid. He does not appear to have a goiter, thyroid nodules, or neck compression symptoms - His TFTs fluctuate >> last TSH close to 10 - He is very reticent to start unnecessary meds and would like to avoid Levothyroxine due to previous experience with the med. (He probably had an intolerance to Levothyroxine before 2/2 the high dose used). - Will check TFTs today. If TSH raises above 10 or he has sxs of hypothyroidism, we discussed to start 25 mcg Levothyroxine to avoid SEs of hypothyroidism >> we discussed about possible heart consequences of this - Return in about 6 months (around 09/03/2016).   2. DM2, controlled - uncomplicated - Last 0000000 was higher, at 7.1%. I advised him to start checking sugars at home,  but since he would like to avoid medicines, I did not suggest to start metformin.We rechecked HbA1c today >> 6.3% (better!)  - he had a flu shot this season - UTD with eye exam - He is reticent to start on any medication if not absolutely necessary  Office Visit on 03/06/2016  Component Date Value Ref Range Status  . TSH 03/06/2016 4.13  0.35 - 4.50 uIU/mL Final  . Free T4 03/06/2016 0.67  0.60 - 1.60 ng/dL Final   Comment: Specimens from patients who are undergoing biotin therapy and /or ingesting biotin supplements may contain high levels of biotin.  The higher biotin concentration in these specimens interferes with this Free T4 assay.  Specimens that contain high levels  of biotin may cause false high results for this Free T4 assay.  Please interpret results in light of the total clinical presentation of the patient.    . T3, Free 03/06/2016 3.1  2.3 - 4.2 pg/mL Final   Thyroid tests have normalized again.  Philemon Kingdom, MD PhD [facility]

## 2016-03-06 NOTE — Patient Instructions (Addendum)
Please stop at the lab.  If we need to start Levothyroxine, take the thyroid hormone every day, with water, at least 30 minutes before breakfast, separated by at least 4 hours from: - acid reflux medications - calcium - iron - multivitamins  Please come back for a follow-up appointment in 6 months.    

## 2016-04-06 ENCOUNTER — Encounter: Payer: Self-pay | Admitting: *Deleted

## 2016-05-19 ENCOUNTER — Encounter: Payer: Self-pay | Admitting: Internal Medicine

## 2016-07-01 ENCOUNTER — Encounter: Payer: Self-pay | Admitting: Cardiovascular Disease

## 2016-07-02 DIAGNOSIS — E785 Hyperlipidemia, unspecified: Secondary | ICD-10-CM | POA: Diagnosis not present

## 2016-07-02 DIAGNOSIS — Z0000 Encounter for general adult medical examination without abnormal findings: Secondary | ICD-10-CM | POA: Diagnosis not present

## 2016-07-02 DIAGNOSIS — I10 Essential (primary) hypertension: Secondary | ICD-10-CM | POA: Diagnosis not present

## 2016-07-02 DIAGNOSIS — K219 Gastro-esophageal reflux disease without esophagitis: Secondary | ICD-10-CM | POA: Diagnosis not present

## 2016-07-02 DIAGNOSIS — I2510 Atherosclerotic heart disease of native coronary artery without angina pectoris: Secondary | ICD-10-CM | POA: Diagnosis not present

## 2016-07-02 DIAGNOSIS — Z6827 Body mass index (BMI) 27.0-27.9, adult: Secondary | ICD-10-CM | POA: Diagnosis not present

## 2016-07-03 DIAGNOSIS — I10 Essential (primary) hypertension: Secondary | ICD-10-CM | POA: Diagnosis not present

## 2016-07-03 DIAGNOSIS — Z1159 Encounter for screening for other viral diseases: Secondary | ICD-10-CM | POA: Diagnosis not present

## 2016-07-03 DIAGNOSIS — I2510 Atherosclerotic heart disease of native coronary artery without angina pectoris: Secondary | ICD-10-CM | POA: Diagnosis not present

## 2016-07-03 DIAGNOSIS — Z0000 Encounter for general adult medical examination without abnormal findings: Secondary | ICD-10-CM | POA: Diagnosis not present

## 2016-07-03 DIAGNOSIS — R7309 Other abnormal glucose: Secondary | ICD-10-CM | POA: Diagnosis not present

## 2016-07-08 ENCOUNTER — Ambulatory Visit: Payer: BLUE CROSS/BLUE SHIELD | Admitting: Cardiovascular Disease

## 2016-08-20 ENCOUNTER — Ambulatory Visit: Payer: BLUE CROSS/BLUE SHIELD | Admitting: Cardiovascular Disease

## 2016-09-03 ENCOUNTER — Ambulatory Visit: Payer: BLUE CROSS/BLUE SHIELD | Admitting: Internal Medicine

## 2016-10-16 ENCOUNTER — Telehealth: Payer: Self-pay | Admitting: Cardiovascular Disease

## 2016-10-16 ENCOUNTER — Encounter: Payer: Self-pay | Admitting: Internal Medicine

## 2016-10-16 NOTE — Telephone Encounter (Signed)
Called, spoke with pt's wife, Hoyle Sauer (on [place]). Hoyle Sauer stated she is calling to schedule an appointment for her husband - appointment must be within the next 2 weeks. She stated several appointments have been cancelled, due to pt's out-of-town work schedule. Informed there are no available appointments in the time frame requested. Informed I will forward to Dr. Acie Fredrickson and nurse to advise.

## 2016-10-16 NOTE — Telephone Encounter (Signed)
New Message  Pt wife call requesting to speak with RN. 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 Acie Fredrickson. Please call back to discuss

## 2016-10-16 NOTE — Telephone Encounter (Signed)
Due to scheduling , I do not think that I have openings in the next 2 weeks He may have to see a PA or NP

## 2016-10-19 NOTE — Telephone Encounter (Signed)
Spoke with patient who states he is having increased episodes of chest discomfort. He states he does have some relief of symptoms with SL NTG. He states he also thinks that he is having problems with his breathing. He has an upcoming appointment with [name]. He states he prefers to see Dr. Acie Fredrickson to check his heart. I advised him that his first available appointment is July 18 and offered to schedule him with APP prior to that time. He states he would rather wait to see Dr. Acie Fredrickson. I advised him that if symptoms worsen or if he has other concerns arise to please call back for a sooner appointment or go to the ED. He verbalized understanding and agreement and thanked me for the call.

## 2016-11-04 ENCOUNTER — Ambulatory Visit (INDEPENDENT_AMBULATORY_CARE_PROVIDER_SITE_OTHER): Payer: BLUE CROSS/BLUE SHIELD | Admitting: Cardiovascular Disease

## 2016-11-04 ENCOUNTER — Encounter: Payer: Self-pay | Admitting: Cardiovascular Disease

## 2016-11-04 VITALS — BP 130/74 | HR 60 | Ht 70.0 in | Wt 188.8 lb

## 2016-11-04 DIAGNOSIS — I25110 Atherosclerotic heart disease of native coronary artery with unstable angina pectoris: Secondary | ICD-10-CM | POA: Diagnosis not present

## 2016-11-04 MED ORDER — METOPROLOL TARTRATE 25 MG PO TABS: 25.0000 mg | ORAL_TABLET | Freq: Two times a day (BID) | ORAL | 3 refills | Status: DC

## 2016-11-04 MED ORDER — NITROGLYCERIN 0.4 MG SL SUBL: 0.4000 mg | SUBLINGUAL_TABLET | SUBLINGUAL | 4 refills | Status: DC | PRN

## 2016-11-04 MED ORDER — HYDROCHLOROTHIAZIDE 25 MG PO TABS: 12.5000 mg | ORAL_TABLET | Freq: Every day | ORAL | 3 refills | Status: DC

## 2016-11-04 MED ORDER — CHOLINE FENOFIBRATE 135 MG PO CPDR: 135.0000 mg | DELAYED_RELEASE_CAPSULE | Freq: Every day | ORAL | 3 refills | Status: DC

## 2016-11-04 NOTE — Patient Instructions (Signed)
Medication Instructions:  Your physician recommends that you continue on your current medications as directed. Please refer to the Current Medication list given to you today.   Labwork: TODAY - PT/INR, CBC, BMET   Testing/Procedures:   [facility] OFFICE [address] [place] [address] Dept: [phone] Loc: [phone]  MARIUSZ JUBB  11/04/2016  You are scheduled for a Cardiac Catheterization on Thursday, July 19 with Dr. Harrell Gave End.  1. Please arrive at the [facility] (Main Entrance A) at [facility]: [place], [place] [address] at 11:30 AM (two hours before your procedure to ensure your preparation). Free valet parking service is available.   Special note: Every effort is made to have your procedure done on time. Please understand that emergencies sometimes delay scheduled procedures.  2. Diet: You may have a clear liquid breakfast, but nothing after 8 AM on your procedure day.  3. Labs: You will need to have blood drawn on Wednesday, July 18 at [facility] at [facility]. [address]  Open: 7:30am - 5pm    Phone: [phone]. You do not need to be fasting.  4. Medication instructions in preparation for your procedure:  On the morning of your procedure, take your Aspirin and any morning medicines NOT listed above.  You may use sips of water.  5. Plan for one night stay--bring personal belongings. 6. Bring a current list of your medications and current insurance cards. 7. You MUST have a responsible person to drive you home. 8. Someone MUST be with you the first 24 hours after you arrive home or your discharge will be delayed. 9. Please wear clothes that are easy to get on and off and wear slip-on shoes.  Thank you for allowing [place] to care for you!   -- [place] [facility]   Follow-Up: Your  physician recommends that you schedule a follow-up appointment in: 3 months with Dr. Acie Fredrickson   If you need a refill on your cardiac medications before your next appointment, please call your pharmacy.   Thank you for choosing [facility]! Christen Bame, RN [phone]

## 2016-11-04 NOTE — Progress Notes (Signed)
Daniel Carey Date of Birth  1951-03-27       [facility] [address], [place], [place]  [address]   [place], [place]  [address] [phone]     [phone]   Fax  [phone]    Fax [phone]  Problem List: 1. CAD - 2.5 x 38 mm Promus Premier DES in the distal RCA and a 2.75 x 38 mm Promus Premier DES in the mid vessel in an overlapping fashion. The distal stent was post-dilated with a 2.5 x 20 mm [place] balloon. The proximal portion of the stent was post-dilated with a 3.0 x 20 mm [place] balloon ( June 30, 2012)  2. Bradycardia after getting propafol for endoscopy. 3. Obstructive sleep apnea:     History of Present Illness:  Daniel Carey is feeling well today.  He's been getting some exercise. He does yard work and walks on the treadmill occasionally. He had an episode of bradycardia after getting IV protocol for an endoscopy procedure. He was seen by Daniel Carey. His metoprolol dose was decreased. He has also had a bronchoscopy with Dr. Melvyn Carey. He does not have any evidence of COPD.  Both he and his wife have stop smoking.  ( Aug 20, 2011)  Feb. 28, 2014:  He continues to have increasing chest pain.  He has not been smoking.  The pains are associated with exertion, last 5-10 minutes, resolve with 1 SL NTG.   He's had a mildly elevated TSH for the past year or so and has had some fatigue.    his TSH level 6.8.      His medical doctor started him on Synthroid. He gradually has titrated up to 0.1 mg a day. Since that time he's had increasing episodes of chest pain.  Aug 29, 2012:  Daniel Carey saw him in February we performed a cardiac catheterization because of increasing chest pain. He was found to have sequential stenosis in his mid right coronary artery and had PCI of his right coronary artery ( 2.5 x 38 mm Promus Premier DES in the distal RCA and a 2.75 x 38 mm Promus Premier DES in the mid vessel in an overlapping fashion. The  distal sten was post-dilated with a 2.5 x 20 mm [place] balloon. The proximal portion of the stent was post-dilated with a 3.0 x 20 mm  balloon)  He and his wife are concerned about whether or not the Plavix is active.  His generic plavix manufacturer has changed.   He would like to be tested.   October 14, 2012:  Daniel Carey has been having some occasional CP.  He has had some episodes of dyspnea while climbing up a hill playing golf.  This past Monday night, he had some chest pain - possible due to indigestions.  these  He had lost his NTG - he has refilled it now.    Nov. 10, 2014:  Daniel Carey is doing well.  He is working too much according to wife. he's had a few CP but not severe enough to take NTG.  Still has some dyspnea with climbing steps.   He had stenting in march 2014.  October 23, 2013:  Daniel Carey is doing well.   Playing golf.  Working in the yard.  Not exercising as much.   He has lost about 6 lbs.  He had labs drawn today.     Jan. 7, 2016:  He has been  waking up in the middle of the night and feels like he cant catch his  Breath. Still working out - on the treadmill twice this week and has done well.   Sept. 1, 2016:  Has had some short of breath. Does not get any exercise.  His CP is much better since starting CPAP .  Is having bloating  - thinks its due to the lipitor   July 19, 2015: Has had some shortness of breath.   With exertion . Happens occasionally but seem to be getting more frequently .  BP is elevated today  Has cut back on his salt .   Sept. 21, 2017:  Daniel Carey is seen today for follow up visit for his CAD Occasionally short of breath . Comes and goes .  BP is a little elevated today Has occasional CP  Able to do all that he wants to do   November 04, 2016: Still having some chest pain Not exertional ,  Seems very similar to his previous episodes of angina prior to stenting .    Current Outpatient Prescriptions on File Prior to Visit  Medication Sig Dispense  Refill  . albuterol (PROVENTIL HFA;VENTOLIN HFA) 108 (90 Base) MCG/ACT inhaler Inhale 2 puffs into the lungs every 6 (six) hours as needed for wheezing or shortness of breath. 1 Inhaler 2  . aspirin EC 81 MG tablet Take 81 mg by mouth daily.    . Choline Fenofibrate 135 MG capsule Take 1 capsule (135 mg total) by mouth daily. 90 capsule 3  . famotidine (PEPCID) 20 MG tablet Take 20 mg by mouth daily as needed for heartburn.    . hydrochlorothiazide (HYDRODIURIL) 25 MG tablet Take 0.5 tablets (12.5 mg total) by mouth daily. 45 tablet 3  . metoprolol tartrate (LOPRESSOR) 25 MG tablet Take 1 tablet (25 mg total) by mouth 2 (two) times daily. 180 tablet 3  . naproxen sodium (ANAPROX) 220 MG tablet Take 220 mg by mouth daily as needed (headache).    Marland Kitchen NITROSTAT 0.4 MG SL tablet PLACE 1 TABLET (0.4 MG TOTAL) UNDER THE TONGUE EVERY 5 (FIVE) MINUTES AS NEEDED FOR CHEST PAIN. 25 tablet 4  . RABEprazole (ACIPHEX) 20 MG tablet Take 2 tablets (40 mg total) by mouth daily. 60 tablet 11   No current facility-administered medications on file prior to visit.     Allergies  Allergen Reactions  . Lisinopril Cough and Rash  . Amlodipine Besylate Other (See Comments)    Headache, weakness, dizziness  . Propofol Other (See Comments)    Heart rate dropped  . Rosuvastatin Other (See Comments)     muscle aches    Past Medical History:  Diagnosis Date  . 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 CAD (coronary artery disease)    moderate to severe distal CAD with large epicardial arteries having only mild to moderate irregularities. Last cath in Dec 2006; s/p cath with Promus DES in distal RCA and Promus DES in the mid vessel in an overlapping fashion  . Chest pain   . COPD (chronic obstructive pulmonary disease) ([place])   . ED (erectile dysfunction)   . Esophageal stricture   . External hemorrhoids without mention of complication   . GERD (gastroesophageal reflux disease)   . Headache(784.0)   .  Hiatal hernia   . HLD (hyperlipidemia)   . HTN (hypertension)   . Nephrolithiasis   . Personal history of colonic polyps 03/12/2010  TUBULAR ADENOMA  . Tobacco abuse     Past Surgical History:  Procedure Laterality Date  . ANGIOPLASTY     X3  . PERCUTANEOUS CORONARY ROTOBLATOR INTERVENTION (PCI-R) N/A 06/30/2012   Procedure: PERCUTANEOUS CORONARY ROTOBLATOR INTERVENTION (PCI-R);  Surgeon: Burnell Blanks, MD;  Location: [facility] CATH LAB;  Service: Cardiovascular;  Laterality: N/A;  . VIDEO BRONCHOSCOPY  08/13/2011   Procedure: VIDEO BRONCHOSCOPY WITHOUT FLUORO;  Surgeon: Tanda Rockers, MD;  Location: [facility] ENDOSCOPY;  Service: Cardiopulmonary;  Laterality: Bilateral;    History  Smoking Status  . Former Smoker  . Packs/day: 0.50  . Years: 40.00  . Types: Cigarettes  . Quit date: 08/25/2011  Smokeless Tobacco  . Never Used    History  Alcohol Use  . 0.0 oz/week    Comment: rare    Family History  Problem Relation Age of Onset  . Heart disease Father        hx of MI  . Diabetes Mother        and brother   . Kidney failure Mother   . Breast cancer Sister   . Colon cancer Neg Hx   . Stomach cancer Neg Hx     Reviw of Systems:  Reviewed in the HPI.  All other systems are negative.  Physical Exam: Blood pressure 130/74, pulse 60, height 5\' 10"  (1.778 m), weight 188 lb 12.8 oz (85.6 kg). General: Well developed, well nourished, in no acute distress. Head: Normocephalic, atraumatic, sclera non-icteric, mucus membranes are moist,  Neck: Supple. Carotids are 2 + without bruits. No JVD Lungs: Clear bilaterally to auscultation. Heart: regular rate.  normal  S1 S2. No murmurs, gallops or rubs. Abdomen: Soft, non-tender, non-distended with normal bowel sounds. No hepatomegaly. No rebound/guarding. No masses. Msk:  Strength and tone are normal Extremities: No clubbing or cyanosis. No edema.  Distal pedal pulses are 2+ and equal bilaterally. Neuro: Alert and oriented X 3.  Moves all extremities spontaneously. Psych:  Responds to questions appropriately with a normal affect.  ECG: November 04, 2016-  Sinus rhythm at 60,  1st degree AV block , poor R wave progression   Assessment / Plan:   1. Dyspnea:  Unclear etiology at this point. He has been seen by pulmonary and was no evidence of COPD. It's been years since his previous echocardiogram. We will repeat his echocardiogram. His symptoms do not sound like angina.  2. CAD - 2.5 x 38 mm Promus Premier DES in the distal RCA and a 2.75 x 38 mm Promus Premier DES in the mid vessel in an overlapping fashion. The distal stent was post-dilated with a 2.5 x 20 mm [place] balloon. The proximal portion of the stent was post-dilated with a 3.0 x 20 mm [place] balloon ( June 30, 2012)  He is having more CP , similar to his previous CP. A myoview would be difficult to interpret. I think her best option is to proceed with cardiac catheterization.  3.  Obstructive sleep apnea:    Sees Dr. Radford Pax - feeling better.   4. Hyperlipidemia: We will continue with current dose of atorvastatin .   He has stopped the Fenofibrate. Also has trouble with muscle aches if he takes atorvastatin daily for more than several weeks. Check labs today    Mertie Moores, MD  11/04/2016 11:28 AM    [place],  [place],   [address] Pager [phone] Phone: [phone]; Fax: [phone]

## 2016-11-05 ENCOUNTER — Encounter (HOSPITAL_COMMUNITY)
Admission: RE | Disposition: A | Discharge status: Home / Self Care | Payer: Self-pay | Source: Ambulatory Visit | Attending: Internal Medicine

## 2016-11-05 ENCOUNTER — Ambulatory Visit (HOSPITAL_COMMUNITY)
Admission: RE | Admit: 2016-11-05 | Discharge: 2016-11-05 | Disposition: A | Discharge status: Home / Self Care | Payer: BLUE CROSS/BLUE SHIELD | Source: Ambulatory Visit | Attending: Internal Medicine | Admitting: Internal Medicine

## 2016-11-05 DIAGNOSIS — R001 Bradycardia, unspecified: Secondary | ICD-10-CM | POA: Insufficient documentation

## 2016-11-05 DIAGNOSIS — J449 Chronic obstructive pulmonary disease, unspecified: Secondary | ICD-10-CM | POA: Diagnosis not present

## 2016-11-05 DIAGNOSIS — I10 Essential (primary) hypertension: Secondary | ICD-10-CM | POA: Diagnosis not present

## 2016-11-05 DIAGNOSIS — I200 Unstable angina: Secondary | ICD-10-CM | POA: Diagnosis present

## 2016-11-05 DIAGNOSIS — I25110 Atherosclerotic heart disease of native coronary artery with unstable angina pectoris: Secondary | ICD-10-CM | POA: Diagnosis not present

## 2016-11-05 DIAGNOSIS — E785 Hyperlipidemia, unspecified: Secondary | ICD-10-CM | POA: Diagnosis not present

## 2016-11-05 DIAGNOSIS — I2584 Coronary atherosclerosis due to calcified coronary lesion: Secondary | ICD-10-CM | POA: Insufficient documentation

## 2016-11-05 DIAGNOSIS — K219 Gastro-esophageal reflux disease without esophagitis: Secondary | ICD-10-CM | POA: Insufficient documentation

## 2016-11-05 DIAGNOSIS — Z87891 Personal history of nicotine dependence: Secondary | ICD-10-CM | POA: Insufficient documentation

## 2016-11-05 DIAGNOSIS — Z7982 Long term (current) use of aspirin: Secondary | ICD-10-CM | POA: Insufficient documentation

## 2016-11-05 DIAGNOSIS — I441 Atrioventricular block, second degree: Secondary | ICD-10-CM | POA: Diagnosis not present

## 2016-11-05 DIAGNOSIS — G4733 Obstructive sleep apnea (adult) (pediatric): Secondary | ICD-10-CM | POA: Diagnosis not present

## 2016-11-05 DIAGNOSIS — Z7902 Long term (current) use of antithrombotics/antiplatelets: Secondary | ICD-10-CM | POA: Insufficient documentation

## 2016-11-05 HISTORY — PX: LEFT HEART CATH AND CORONARY ANGIOGRAPHY: CATH118249

## 2016-11-05 LAB — PROTIME-INR
INR: 1.1 (ref 0.8–1.2)
Prothrombin Time: 11.2 s (ref 9.1–12.0)

## 2016-11-05 LAB — BASIC METABOLIC PANEL
Anion gap: 9 (ref 5–15)
BUN/Creatinine Ratio: 11 (ref 10–24)
BUN: 12 mg/dL (ref 8–27)
BUN: 15 mg/dL (ref 6–20)
CO2: 26 mmol/L (ref 20–29)
CO2: 26 mmol/L (ref 22–32)
Calcium: 10.2 mg/dL (ref 8.6–10.2)
Calcium: 9.4 mg/dL (ref 8.9–10.3)
Chloride: 98 mmol/L (ref 96–106)
Chloride: 100 mmol/L — ABNORMAL LOW (ref 101–111)
Creatinine, Ser: 1.08 mg/dL (ref 0.76–1.27)
Creatinine, Ser: 1.1 mg/dL (ref 0.61–1.24)
GFR calc Af Amer: 83 mL/min/{1.73_m2} (ref >59)
GFR calc Af Amer: >60 mL/min (ref >60)
GFR calc non Af Amer: 72 mL/min/{1.73_m2} (ref >59)
GFR calc non Af Amer: >60 mL/min (ref >60)
Glucose, Bld: 140 mg/dL — ABNORMAL HIGH (ref 65–99)
Glucose: 110 mg/dL — ABNORMAL HIGH (ref 65–99)
Potassium: 5.6 mmol/L — ABNORMAL HIGH (ref 3.5–5.2)
Potassium: 4.8 mmol/L (ref 3.5–5.1)
Sodium: 139 mmol/L (ref 134–144)
Sodium: 135 mmol/L (ref 135–145)

## 2016-11-05 LAB — CBC
Hematocrit: 45.3 % (ref 37.5–51.0)
Hemoglobin: 15.4 g/dL (ref 13.0–17.7)
MCH: 30.4 pg (ref 26.6–33.0)
MCHC: 34 g/dL (ref 31.5–35.7)
MCV: 90 fL (ref 79–97)
Platelets: 304 10*3/uL (ref 150–379)
RBC: 5.06 x10E6/uL (ref 4.14–5.80)
RDW: 14.2 % (ref 12.3–15.4)
WBC: 6.1 10*3/uL (ref 3.4–10.8)

## 2016-11-05 LAB — POCT ACTIVATED CLOTTING TIME: Activated Clotting Time: 274 seconds

## 2016-11-05 SURGERY — LEFT HEART CATH AND CORONARY ANGIOGRAPHY
Anesthesia: LOCAL

## 2016-11-05 MED ORDER — HEPARIN SODIUM (PORCINE) 1000 UNIT/ML IJ SOLN
INTRAMUSCULAR | Status: AC
  Filled 2016-11-05: qty 1

## 2016-11-05 MED ORDER — ADENOSINE (DIAGNOSTIC) 140MCG/KG/MIN
INTRAVENOUS | Status: DC | PRN
  Administered 2016-11-05: 140 ug/kg/min via INTRAVENOUS

## 2016-11-05 MED ORDER — SODIUM CHLORIDE 0.9 % IV SOLN: 250.0000 mL | INTRAVENOUS | Status: DC | PRN

## 2016-11-05 MED ORDER — NITROGLYCERIN 1 MG/10 ML FOR IR/CATH LAB
INTRA_ARTERIAL | Status: DC | PRN
  Administered 2016-11-05: 200 ug via INTRACORONARY

## 2016-11-05 MED ORDER — SODIUM CHLORIDE 0.9% FLUSH: 3.0000 mL | Freq: Two times a day (BID) | INTRAVENOUS | Status: DC

## 2016-11-05 MED ORDER — SODIUM CHLORIDE 0.9% FLUSH: 3.0000 mL | INTRAVENOUS | Status: DC | PRN

## 2016-11-05 MED ORDER — VERAPAMIL HCL 2.5 MG/ML IV SOLN
INTRAVENOUS | Status: AC
Start: 2016-11-05 — End: 2016-11-05
  Filled 2016-11-05: qty 2

## 2016-11-05 MED ORDER — HEPARIN (PORCINE) IN NACL 2-0.9 UNIT/ML-% IJ SOLN
INTRAMUSCULAR | Status: AC | PRN
  Administered 2016-11-05: 1000 mL

## 2016-11-05 MED ORDER — LIDOCAINE HCL (PF) 1 % IJ SOLN
INTRAMUSCULAR | Status: DC | PRN
  Administered 2016-11-05: 2 mL

## 2016-11-05 MED ORDER — MIDAZOLAM HCL 2 MG/2ML IJ SOLN
INTRAMUSCULAR | Status: AC
  Filled 2016-11-05: qty 2

## 2016-11-05 MED ORDER — HEPARIN SODIUM (PORCINE) 1000 UNIT/ML IJ SOLN
INTRAMUSCULAR | Status: DC | PRN
  Administered 2016-11-05: 4500 [IU] via INTRAVENOUS
  Administered 2016-11-05: 4000 [IU] via INTRAVENOUS

## 2016-11-05 MED ORDER — HEPARIN (PORCINE) IN NACL 2-0.9 UNIT/ML-% IJ SOLN
INTRAMUSCULAR | Status: AC
  Filled 2016-11-05: qty 1000

## 2016-11-05 MED ORDER — IOPAMIDOL (ISOVUE-370) INJECTION 76%
INTRAVENOUS | Status: AC
  Filled 2016-11-05: qty 100

## 2016-11-05 MED ORDER — LABETALOL HCL 5 MG/ML IV SOLN: 10.0000 mg | INTRAVENOUS | Status: DC | PRN

## 2016-11-05 MED ORDER — SODIUM CHLORIDE 0.9 % IV SOLN: INTRAVENOUS | Status: DC

## 2016-11-05 MED ORDER — ISOSORBIDE MONONITRATE ER 30 MG PO TB24: 30.0000 mg | ORAL_TABLET | Freq: Every day | ORAL | 11 refills | Status: DC

## 2016-11-05 MED ORDER — VERAPAMIL HCL 2.5 MG/ML IV SOLN
INTRAVENOUS | Status: DC | PRN
  Administered 2016-11-05: 10 mL via INTRA_ARTERIAL

## 2016-11-05 MED ORDER — SODIUM CHLORIDE 0.9 % WEIGHT BASED INFUSION
3.0000 mL/kg/h | INTRAVENOUS | Status: AC
  Administered 2016-11-05: 3 mL/kg/h via INTRAVENOUS

## 2016-11-05 MED ORDER — MIDAZOLAM HCL 2 MG/2ML IJ SOLN
INTRAMUSCULAR | Status: DC | PRN
  Administered 2016-11-05 (×2): 1 mg via INTRAVENOUS

## 2016-11-05 MED ORDER — FENTANYL CITRATE (PF) 100 MCG/2ML IJ SOLN
INTRAMUSCULAR | Status: AC
  Filled 2016-11-05: qty 2

## 2016-11-05 MED ORDER — IOPAMIDOL (ISOVUE-370) INJECTION 76%
INTRAVENOUS | Status: DC | PRN
  Administered 2016-11-05: 120 mL via INTRA_ARTERIAL

## 2016-11-05 MED ORDER — SODIUM CHLORIDE 0.9 % WEIGHT BASED INFUSION: 1.0000 mL/kg/h | INTRAVENOUS | Status: DC

## 2016-11-05 MED ORDER — FENTANYL CITRATE (PF) 100 MCG/2ML IJ SOLN
INTRAMUSCULAR | Status: DC | PRN
  Administered 2016-11-05: 50 ug via INTRAVENOUS
  Administered 2016-11-05: 25 ug via INTRAVENOUS

## 2016-11-05 MED ORDER — HYDRALAZINE HCL 20 MG/ML IJ SOLN: 5.0000 mg | INTRAMUSCULAR | Status: DC | PRN

## 2016-11-05 MED ORDER — ADENOSINE 12 MG/4ML IV SOLN
INTRAVENOUS | Status: AC
  Filled 2016-11-05: qty 16

## 2016-11-05 MED ORDER — LIDOCAINE HCL (PF) 1 % IJ SOLN
INTRAMUSCULAR | Status: AC
  Filled 2016-11-05: qty 30

## 2016-11-05 MED ORDER — NITROGLYCERIN 1 MG/10 ML FOR IR/CATH LAB
INTRA_ARTERIAL | Status: AC
  Filled 2016-11-05: qty 10

## 2016-11-05 MED ORDER — ASPIRIN 81 MG PO CHEW: 81.0000 mg | CHEWABLE_TABLET | ORAL | Status: DC

## 2016-11-05 SURGICAL SUPPLY — 12 items
CATH 5FR JL3.5 JR4 ANG PIG MP (CATHETERS) ×1 IMPLANT
CATH LAUNCHER 6FR EBU3.5 (CATHETERS) ×1 IMPLANT
DEVICE RAD COMP TR BAND LRG (VASCULAR PRODUCTS) ×1 IMPLANT
GLIDESHEATH SLEND SS 6F .021 (SHEATH) ×1 IMPLANT
GUIDEWIRE INQWIRE 1.5J.035X260 (WIRE) IMPLANT
GUIDEWIRE PRESSURE COMET II (WIRE) ×1 IMPLANT
INQWIRE 1.5J .035X260CM (WIRE) ×2
KIT ESSENTIALS PG (KITS) ×1 IMPLANT
KIT HEART LEFT (KITS) ×2 IMPLANT
PACK CARDIAC CATHETERIZATION (CUSTOM PROCEDURE TRAY) ×2 IMPLANT
TRANSDUCER W/STOPCOCK (MISCELLANEOUS) ×2 IMPLANT
TUBING CIL FLEX 10 FLL-RA (TUBING) ×2 IMPLANT

## 2016-11-05 NOTE — Discharge Instructions (Signed)

## 2016-11-05 NOTE — Interval H&P Note (Signed)
History and Physical Interval Note:  11/05/2016 1:19 PM  Daniel Carey  has presented today for cardiac catheterization, with the diagnosis of unstable angina  The various methods of treatment have been discussed with the patient and family. After consideration of risks, benefits and other options for treatment, the patient has consented to  Procedure(s): Left Heart Cath and Coronary Angiography (N/A) as a surgical intervention .  The patient's history has been reviewed, patient examined, no change in status, stable for surgery.  I have reviewed the patient's chart and labs.  Questions were answered to the patient's satisfaction.    Cath Lab Visit (complete for each Cath Lab visit)  Clinical Evaluation Leading to the Procedure:   ACS: No.  Non-ACS:    Anginal Classification: CCS IV  Anti-ischemic medical therapy: Minimal Therapy (1 class of medications)  Non-Invasive Test Results: No non-invasive testing performed  Prior CABG: No previous CABG  Daniel Carey

## 2016-11-05 NOTE — H&P (View-Only) (Signed)
Daniel Carey Date of Birth  1951-03-27       [facility] [address], [place], [place]  [address]   [place], [place]  [address] [phone]     [phone]   Fax  [phone]    Fax [phone]  Problem List: 1. CAD - 2.5 x 38 mm Promus Premier DES in the distal RCA and a 2.75 x 38 mm Promus Premier DES in the mid vessel in an overlapping fashion. The distal stent was post-dilated with a 2.5 x 20 mm [place] balloon. The proximal portion of the stent was post-dilated with a 3.0 x 20 mm [place] balloon ( June 30, 2012)  2. Bradycardia after getting propafol for endoscopy. 3. Obstructive sleep apnea:     History of Present Illness:  Daniel Carey is feeling well today.  He's been getting some exercise. He does yard work and walks on the treadmill occasionally. He had an episode of bradycardia after getting IV protocol for an endoscopy procedure. He was seen by [name]. His metoprolol dose was decreased. He has also had a bronchoscopy with [name]. He does not have any evidence of COPD.  Both he and his wife have stop smoking.  ( Aug 20, 2011)  Feb. 28, 2014:  He continues to have increasing chest pain.  He has not been smoking.  The pains are associated with exertion, last 5-10 minutes, resolve with 1 SL NTG.   He's had a mildly elevated TSH for the past year or so and has had some fatigue.    his TSH level 6.8.      His medical doctor started him on Synthroid. He gradually has titrated up to 0.1 mg a day. Since that time he's had increasing episodes of chest pain.  Aug 29, 2012:  Daniel Carey saw him in February we performed a cardiac catheterization because of increasing chest pain. He was found to have sequential stenosis in his mid right coronary artery and had PCI of his right coronary artery ( 2.5 x 38 mm Promus Premier DES in the distal RCA and a 2.75 x 38 mm Promus Premier DES in the mid vessel in an overlapping fashion. The  distal sten was post-dilated with a 2.5 x 20 mm [place] balloon. The proximal portion of the stent was post-dilated with a 3.0 x 20 mm  balloon)  He and his wife are concerned about whether or not the Plavix is active.  His generic plavix manufacturer has changed.   He would like to be tested.   October 14, 2012:  Daniel Carey has been having some occasional CP.  He has had some episodes of dyspnea while climbing up a hill playing golf.  This past Monday night, he had some chest pain - possible due to indigestions.  these  He had lost his NTG - he has refilled it now.    Nov. 10, 2014:  Daniel Carey is doing well.  He is working too much according to wife. he's had a few CP but not severe enough to take NTG.  Still has some dyspnea with climbing steps.   He had stenting in march 2014.  October 23, 2013:  Daniel Carey is doing well.   Playing golf.  Working in the yard.  Not exercising as much.   He has lost about 6 lbs.  He had labs drawn today.     Jan. 7, 2016:  He has been  waking up in the middle of the night and feels like he cant catch his  Breath. Still working out - on the treadmill twice this week and has done well.   Sept. 1, 2016:  Has had some short of breath. Does not get any exercise.  His CP is much better since starting CPAP .  Is having bloating  - thinks its due to the lipitor   July 19, 2015: Has had some shortness of breath.   With exertion . Happens occasionally but seem to be getting more frequently .  BP is elevated today  Has cut back on his salt .   Sept. 21, 2017:  Daniel Carey is seen today for follow up visit for his CAD Occasionally short of breath . Comes and goes .  BP is a little elevated today Has occasional CP  Able to do all that he wants to do   November 04, 2016: Still having some chest pain Not exertional ,  Seems very similar to his previous episodes of angina prior to stenting .    Current Outpatient Prescriptions on File Prior to Visit  Medication Sig Dispense  Refill  . albuterol (PROVENTIL HFA;VENTOLIN HFA) 108 (90 Base) MCG/ACT inhaler Inhale 2 puffs into the lungs every 6 (six) hours as needed for wheezing or shortness of breath. 1 Inhaler 2  . aspirin EC 81 MG tablet Take 81 mg by mouth daily.    . Choline Fenofibrate 135 MG capsule Take 1 capsule (135 mg total) by mouth daily. 90 capsule 3  . famotidine (PEPCID) 20 MG tablet Take 20 mg by mouth daily as needed for heartburn.    . hydrochlorothiazide (HYDRODIURIL) 25 MG tablet Take 0.5 tablets (12.5 mg total) by mouth daily. 45 tablet 3  . metoprolol tartrate (LOPRESSOR) 25 MG tablet Take 1 tablet (25 mg total) by mouth 2 (two) times daily. 180 tablet 3  . naproxen sodium (ANAPROX) 220 MG tablet Take 220 mg by mouth daily as needed (headache).    Marland Kitchen NITROSTAT 0.4 MG SL tablet PLACE 1 TABLET (0.4 MG TOTAL) UNDER THE TONGUE EVERY 5 (FIVE) MINUTES AS NEEDED FOR CHEST PAIN. 25 tablet 4  . RABEprazole (ACIPHEX) 20 MG tablet Take 2 tablets (40 mg total) by mouth daily. 60 tablet 11   No current facility-administered medications on file prior to visit.     Allergies  Allergen Reactions  . Lisinopril Cough and Rash  . Amlodipine Besylate Other (See Comments)    Headache, weakness, dizziness  . Propofol Other (See Comments)    Heart rate dropped  . Rosuvastatin Other (See Comments)     muscle aches    Past Medical History:  Diagnosis Date  . 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 CAD (coronary artery disease)    moderate to severe distal CAD with large epicardial arteries having only mild to moderate irregularities. Last cath in Dec 2006; s/p cath with Promus DES in distal RCA and Promus DES in the mid vessel in an overlapping fashion  . Chest pain   . COPD (chronic obstructive pulmonary disease) ([place])   . ED (erectile dysfunction)   . Esophageal stricture   . External hemorrhoids without mention of complication   . GERD (gastroesophageal reflux disease)   . Headache(784.0)   .  Hiatal hernia   . HLD (hyperlipidemia)   . HTN (hypertension)   . Nephrolithiasis   . Personal history of colonic polyps 03/12/2010  TUBULAR ADENOMA  . Tobacco abuse     Past Surgical History:  Procedure Laterality Date  . ANGIOPLASTY     X3  . PERCUTANEOUS CORONARY ROTOBLATOR INTERVENTION (PCI-R) N/A 06/30/2012   Procedure: PERCUTANEOUS CORONARY ROTOBLATOR INTERVENTION (PCI-R);  Surgeon: Burnell Blanks, MD;  Location: [facility] CATH LAB;  Service: Cardiovascular;  Laterality: N/A;  . VIDEO BRONCHOSCOPY  08/13/2011   Procedure: VIDEO BRONCHOSCOPY WITHOUT FLUORO;  Surgeon: Tanda Rockers, MD;  Location: [facility] ENDOSCOPY;  Service: Cardiopulmonary;  Laterality: Bilateral;    History  Smoking Status  . Former Smoker  . Packs/day: 0.50  . Years: 40.00  . Types: Cigarettes  . Quit date: 08/25/2011  Smokeless Tobacco  . Never Used    History  Alcohol Use  . 0.0 oz/week    Comment: rare    Family History  Problem Relation Age of Onset  . Heart disease Father        hx of MI  . Diabetes Mother        and brother   . Kidney failure Mother   . Breast cancer Sister   . Colon cancer Neg Hx   . Stomach cancer Neg Hx     Reviw of Systems:  Reviewed in the HPI.  All other systems are negative.  Physical Exam: Blood pressure 130/74, pulse 60, height 5\' 10"  (1.778 m), weight 188 lb 12.8 oz (85.6 kg). General: Well developed, well nourished, in no acute distress. Head: Normocephalic, atraumatic, sclera non-icteric, mucus membranes are moist,  Neck: Supple. Carotids are 2 + without bruits. No JVD Lungs: Clear bilaterally to auscultation. Heart: regular rate.  normal  S1 S2. No murmurs, gallops or rubs. Abdomen: Soft, non-tender, non-distended with normal bowel sounds. No hepatomegaly. No rebound/guarding. No masses. Msk:  Strength and tone are normal Extremities: No clubbing or cyanosis. No edema.  Distal pedal pulses are 2+ and equal bilaterally. Neuro: Alert and oriented X 3.  Moves all extremities spontaneously. Psych:  Responds to questions appropriately with a normal affect.  ECG: November 04, 2016-  Sinus rhythm at 60,  1st degree AV block , poor R wave progression   Assessment / Plan:   1. Dyspnea:  Unclear etiology at this point. He has been seen by pulmonary and was no evidence of COPD. It's been years since his previous echocardiogram. We will repeat his echocardiogram. His symptoms do not sound like angina.  2. CAD - 2.5 x 38 mm Promus Premier DES in the distal RCA and a 2.75 x 38 mm Promus Premier DES in the mid vessel in an overlapping fashion. The distal stent was post-dilated with a 2.5 x 20 mm [place] balloon. The proximal portion of the stent was post-dilated with a 3.0 x 20 mm [place] balloon ( June 30, 2012)  He is having more CP , similar to his previous CP. A myoview would be difficult to interpret. I think her best option is to proceed with cardiac catheterization.  3.  Obstructive sleep apnea:    Sees Dr. Radford Pax - feeling better.   4. Hyperlipidemia: We will continue with current dose of atorvastatin .   He has stopped the Fenofibrate. Also has trouble with muscle aches if he takes atorvastatin daily for more than several weeks. Check labs today    Mertie Moores, MD  11/04/2016 11:28 AM    [place],  [place],   [address] Pager [phone] Phone: [phone]; Fax: [phone]

## 2016-11-05 NOTE — Brief Op Note (Signed)
Brief Cardiac Catheterization Note  Date: 11/05/2016 Time: 2:46 PM  PATIENT:  Daniel Carey  66 y.o. male  PRE-OPERATIVE DIAGNOSIS:  unstable angina  POST-OPERATIVE DIAGNOSIS:  Multivessel CAD  PROCEDURE:  Procedure(s): Left Heart Cath and Coronary Angiography (N/A)  SURGEON:  Surgeon(s) and Role:    * Domenica Weightman, MD - Primary  FINDINGS: 1. Multivessel CAD, including sequential calcified proximal and mid LAD lesions of 50-70% (FFR 0.73), 70% OM3 stenosis, and tandem 40-50% mid RCA in-stent restenosis. 2. Normal LVEF.  RECOMMENDATIONS: 1. Optimize antianginal therapy, including addition of isosorbide mononitrate. 2. Patient to f/u with Dr. Acie Fredrickson in ~2 weeks to reassess symptoms and discuss revascularization plan. Most significant lesion is long, calcified proximal/mid LAD stenosis that could be treated with atherectomy and stenting. However, given borderline significant OM and RCA disease, CABG is a consideration as well.  Nelva Bush, MD [facility] Pager: [phone]

## 2016-11-06 ENCOUNTER — Encounter (HOSPITAL_COMMUNITY): Payer: Self-pay | Admitting: Internal Medicine

## 2016-11-12 DIAGNOSIS — G4733 Obstructive sleep apnea (adult) (pediatric): Secondary | ICD-10-CM | POA: Diagnosis not present

## 2016-11-17 ENCOUNTER — Ambulatory Visit (INDEPENDENT_AMBULATORY_CARE_PROVIDER_SITE_OTHER): Payer: BLUE CROSS/BLUE SHIELD | Admitting: Cardiovascular Disease

## 2016-11-17 ENCOUNTER — Telehealth: Payer: Self-pay | Admitting: Cardiovascular Disease

## 2016-11-17 ENCOUNTER — Encounter: Payer: Self-pay | Admitting: Cardiovascular Disease

## 2016-11-17 VITALS — BP 158/82 | HR 68 | Ht 70.0 in | Wt 190.6 lb

## 2016-11-17 DIAGNOSIS — I2510 Atherosclerotic heart disease of native coronary artery without angina pectoris: Secondary | ICD-10-CM

## 2016-11-17 DIAGNOSIS — E782 Mixed hyperlipidemia: Secondary | ICD-10-CM

## 2016-11-17 LAB — COMPREHENSIVE METABOLIC PANEL
ALT: 19 IU/L (ref 0–44)
AST: 18 IU/L (ref 0–40)
Albumin/Globulin Ratio: 1.5 (ref 1.2–2.2)
Albumin: 4.3 g/dL (ref 3.6–4.8)
Alkaline Phosphatase: 55 IU/L (ref 39–117)
BUN/Creatinine Ratio: 12 (ref 10–24)
BUN: 15 mg/dL (ref 8–27)
Bilirubin Total: 0.4 mg/dL (ref 0.0–1.2)
CO2: 24 mmol/L (ref 20–29)
Calcium: 9.6 mg/dL (ref 8.6–10.2)
Chloride: 99 mmol/L (ref 96–106)
Creatinine, Ser: 1.21 mg/dL (ref 0.76–1.27)
GFR calc Af Amer: 72 mL/min/{1.73_m2} (ref >59)
GFR calc non Af Amer: 62 mL/min/{1.73_m2} (ref >59)
Globulin, Total: 2.8 g/dL (ref 1.5–4.5)
Glucose: 137 mg/dL — ABNORMAL HIGH (ref 65–99)
Potassium: 5.4 mmol/L — ABNORMAL HIGH (ref 3.5–5.2)
Sodium: 138 mmol/L (ref 134–144)
Total Protein: 7.1 g/dL (ref 6.0–8.5)

## 2016-11-17 LAB — LIPID PANEL
Chol/HDL Ratio: 3 ratio (ref 0.0–5.0)
Cholesterol, Total: 152 mg/dL (ref 100–199)
HDL: 50 mg/dL (ref >39)
LDL Calculated: 82 mg/dL (ref 0–99)
Triglycerides: 98 mg/dL (ref 0–149)
VLDL Cholesterol Cal: 20 mg/dL (ref 5–40)

## 2016-11-17 MED ORDER — ROSUVASTATIN CALCIUM 20 MG PO TABS: 20.0000 mg | ORAL_TABLET | Freq: Every day | ORAL | 3 refills | Status: DC

## 2016-11-17 NOTE — Telephone Encounter (Signed)
Spoke with patient's wife who states patient tried Crestor in the past and stopped it because of muscle aches. She states he has been taking atorvastatin but instead of taking the 40 mg as prescribed, he has been cutting the pill in half for several years. I asked wife if patient is willing to try Crestor again. She states she believes he is willing and I advised that he may start with Crestor 20 mg every other day and then to increase to 20 mg daily. We ordered lipid profile today and I advised that Dr. Acie Fredrickson will review that and patient may need to be considered for [place] or Praluent. I advised her to call back once patient has taken Crestor for several weeks to let us know how he is doing (or sooner if needed). She verbalized understanding and agreement and thanked me for the call.

## 2016-11-17 NOTE — Patient Instructions (Addendum)
Medication Instructions:  STOP Lipitor (Atorvastatin) START Crestor (Rosuvastatin)   Labwork: TODAY - cholesterol, Liver panel   Testing/Procedures: None Ordered   Follow-Up: Your physician recommends that you return for a follow-up appointment on Monday September 24 at 8:40 am  If you need a refill on your cardiac medications before your next appointment, please call your pharmacy.   Thank you for choosing [facility]! Christen Bame, RN [phone]

## 2016-11-17 NOTE — Progress Notes (Signed)
Daniel Carey Date of Birth  1951-03-27       [facility] [address], [place], [place]  [address]   [place], [place]  [address] [phone]     [phone]   Fax  [phone]    Fax [phone]  Problem List: 1. CAD - 2.5 x 38 mm Promus Premier DES in the distal RCA and a 2.75 x 38 mm Promus Premier DES in the mid vessel in an overlapping fashion. The distal stent was post-dilated with a 2.5 x 20 mm [place] balloon. The proximal portion of the stent was post-dilated with a 3.0 x 20 mm [place] balloon ( June 30, 2012)  2. Bradycardia after getting propafol for endoscopy. 3. Obstructive sleep apnea:     History of Present Illness:  Daniel Carey is feeling well today.  He's been getting some exercise. He does yard work and walks on the treadmill occasionally. He had an episode of bradycardia after getting IV protocol for an endoscopy procedure. He was seen by [name]. His metoprolol dose was decreased. He has also had a bronchoscopy with [name]. He does not have any evidence of COPD.  Both he and his wife have stop smoking.  ( Aug 20, 2011)  Feb. 28, 2014:  He continues to have increasing chest pain.  He has not been smoking.  The pains are associated with exertion, last 5-10 minutes, resolve with 1 SL NTG.   He's had a mildly elevated TSH for the past year or so and has had some fatigue.    his TSH level 6.8.      His medical doctor started him on Synthroid. He gradually has titrated up to 0.1 mg a day. Since that time he's had increasing episodes of chest pain.  Aug 29, 2012:  Daniel Carey saw him in February we performed a cardiac catheterization because of increasing chest pain. He was found to have sequential stenosis in his mid right coronary artery and had PCI of his right coronary artery ( 2.5 x 38 mm Promus Premier DES in the distal RCA and a 2.75 x 38 mm Promus Premier DES in the mid vessel in an overlapping fashion. The  distal sten was post-dilated with a 2.5 x 20 mm [place] balloon. The proximal portion of the stent was post-dilated with a 3.0 x 20 mm  balloon)  He and his wife are concerned about whether or not the Plavix is active.  His generic plavix manufacturer has changed.   He would like to be tested.   October 14, 2012:  Daniel Carey has been having some occasional CP.  He has had some episodes of dyspnea while climbing up a hill playing golf.  This past Monday night, he had some chest pain - possible due to indigestions.  these  He had lost his NTG - he has refilled it now.    Nov. 10, 2014:  Daniel Carey is doing well.  He is working too much according to wife. he's had a few CP but not severe enough to take NTG.  Still has some dyspnea with climbing steps.   He had stenting in march 2014.  October 23, 2013:  Daniel Carey is doing well.   Playing golf.  Working in the yard.  Not exercising as much.   He has lost about 6 lbs.  He had labs drawn today.     Jan. 7, 2016:  He has been  waking up in the middle of the night and feels like he cant catch his  Breath. Still working out - on the treadmill twice this week and has done well.   Sept. 1, 2016:  Has had some short of breath. Does not get any exercise.  His CP is much better since starting CPAP .  Is having bloating  - thinks its due to the lipitor   July 19, 2015: Has had some shortness of breath.   With exertion . Happens occasionally but seem to be getting more frequently .  BP is elevated today  Has cut back on his salt .   Sept. 21, 2017:  Daniel Carey is seen today for follow up visit for his CAD Occasionally short of breath . Comes and goes .  BP is a little elevated today Has occasional CP  Able to do all that he wants to do   November 04, 2016: Still having some chest pain Not exertional ,  Seems very similar to his previous episodes of angina prior to stenting .   July 31 , 2108  Daniel Carey is seen today for follow up of a recent cath.  He was found to  have diffuse moderate - severe CAD - especially in the LAD  Was started on Imdur - is having a headache with that. Angina seems to be slightly  better.  He still has marked DOE - wife is very concerned about this .   is going to see pulmonary soon .    Current Outpatient Prescriptions on File Prior to Visit  Medication Sig Dispense Refill  . aspirin EC 81 MG tablet Take 81 mg by mouth daily.    Marland Kitchen atorvastatin (LIPITOR) 40 MG tablet Take 40 mg by mouth every evening.     . Choline Fenofibrate 135 MG capsule Take 1 capsule (135 mg total) by mouth daily. 90 capsule 3  . clopidogrel (PLAVIX) 75 MG tablet Take 75 mg by mouth daily.    . famotidine (PEPCID) 20 MG tablet Take 20 mg by mouth daily as needed for heartburn.    . hydrochlorothiazide (HYDRODIURIL) 25 MG tablet Take 0.5 tablets (12.5 mg total) by mouth daily. 45 tablet 3  . isosorbide mononitrate (IMDUR) 30 MG 24 hr tablet Take 1 tablet (30 mg total) by mouth daily. 30 tablet 11  . metoprolol tartrate (LOPRESSOR) 25 MG tablet Take 1 tablet (25 mg total) by mouth 2 (two) times daily. 180 tablet 3  . naproxen sodium (ANAPROX) 220 MG tablet Take 220 mg by mouth daily as needed (headache).    . nitroGLYCERIN (NITROSTAT) 0.4 MG SL tablet Place 1 tablet (0.4 mg total) under the tongue every 5 (five) minutes as needed for chest pain. 25 tablet 4   No current facility-administered medications on file prior to visit.     Allergies  Allergen Reactions  . Lisinopril Cough and Rash  . Amlodipine Besylate Other (See Comments)    Headache, weakness, dizziness  . Propofol Other (See Comments)    Heart rate dropped  . Rosuvastatin Other (See Comments)     muscle aches    Past Medical History:  Diagnosis Date  . 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 CAD (coronary artery disease)    moderate to severe distal CAD with large epicardial arteries having only mild to moderate irregularities. Last cath in Dec 2006; s/p cath with Promus  DES in distal RCA and Promus DES in the  mid vessel in an overlapping fashion  . Chest pain   . COPD (chronic obstructive pulmonary disease) ([place])   . ED (erectile dysfunction)   . Esophageal stricture   . External hemorrhoids without mention of complication   . GERD (gastroesophageal reflux disease)   . Headache(784.0)   . Hiatal hernia   . HLD (hyperlipidemia)   . HTN (hypertension)   . Nephrolithiasis   . Personal history of colonic polyps 03/12/2010   TUBULAR ADENOMA  . Tobacco abuse     Past Surgical History:  Procedure Laterality Date  . ANGIOPLASTY     X3  . LEFT HEART CATH AND CORONARY ANGIOGRAPHY N/A 11/05/2016   Procedure: Left Heart Cath and Coronary Angiography;  Surgeon: Nelva Bush, MD;  Location: [facility] CV LAB;  Service: Cardiovascular;  Laterality: N/A;  . PERCUTANEOUS CORONARY ROTOBLATOR INTERVENTION (PCI-R) N/A 06/30/2012   Procedure: PERCUTANEOUS CORONARY ROTOBLATOR INTERVENTION (PCI-R);  Surgeon: Burnell Blanks, MD;  Location: [facility] CATH LAB;  Service: Cardiovascular;  Laterality: N/A;  . VIDEO BRONCHOSCOPY  08/13/2011   Procedure: VIDEO BRONCHOSCOPY WITHOUT FLUORO;  Surgeon: Tanda Rockers, MD;  Location: [facility] ENDOSCOPY;  Service: Cardiopulmonary;  Laterality: Bilateral;    History  Smoking Status  . Former Smoker  . Packs/day: 0.50  . Years: 40.00  . Types: Cigarettes  . Quit date: 08/25/2011  Smokeless Tobacco  . Never Used    History  Alcohol Use  . 0.0 oz/week    Comment: rare    Family History  Problem Relation Age of Onset  . Heart disease Father        hx of MI  . Diabetes Mother        and brother   . Kidney failure Mother   . Breast cancer Sister   . Colon cancer Neg Hx   . Stomach cancer Neg Hx     Reviw of Systems:  Reviewed in the HPI.  All other systems are negative.  Physical Exam: Blood pressure (!) 158/82, pulse 68, height 5\' 10"  (1.778 m), weight 190 lb 9.6 oz (86.5 kg), SpO2 97 %. General: Well developed,  well nourished, in no acute distress. Head: Normocephalic, atraumatic, sclera non-icteric, mucus membranes are moist,  Neck: Supple. Carotids are 2 + without bruits. No JVD Lungs: Clear bilaterally to auscultation. Heart: regular rate.  normal  S1 S2. No murmurs, gallops or rubs. Abdomen: Soft, non-tender, non-distended with normal bowel sounds. No hepatomegaly. No rebound/guarding. No masses. Msk:  Strength and tone are normal Extremities: No clubbing or cyanosis. No edema.  Distal pedal pulses are 2+ and equal bilaterally. Neuro: Alert and oriented X 3. Moves all extremities spontaneously. Psych:  Responds to questions appropriately with a normal affect.  ECG:    Assessment / Plan:   1. Dyspnea:  Unclear etiology at this point.  This could be an angina equivalent. Will see pulmonary soon. If this does not improve, we may have to assume this is angina and proceed with CABG  2. CAD -has 3 V moderate - severe CAD by recent cath. He has been started on Imdur.  If he doesn't make significant improvement, will refer him to [facility].   3.  Obstructive sleep apnea:    Sees Dr. Radford Pax - feeling better.   4. Hyperlipidemia:   We will change the Atorva to Rosuvastatin 20 mg Check labs today . And again in 3 months .  Needs aggressive lipid control.   Mertie Moores, MD  11/17/2016 8:13 AM  [place],  [place], [place]  [address] Pager [phone] Phone: [phone]; Fax: [phone]

## 2016-11-17 NOTE — Telephone Encounter (Signed)
Agree with the plan as outlined by Christen Bame, RN Will see if he tolerates low dose crestor. If not, will change to repatha or pralulent

## 2016-11-17 NOTE — Telephone Encounter (Signed)
New Message     Pt c/o medication issue:  1. Name of Medication:  Crestor   2. How are you currently taking this medication (dosage and times per day)?  Not taking   3. Are you having a reaction (difficulty breathing--STAT)? Not tanking  4. What is your medication issue? Patient just left office and was given prescription for Crestor , it should be on patients allergy list, he is allergic to this medication

## 2016-11-30 ENCOUNTER — Encounter: Payer: Self-pay | Admitting: Internal Medicine

## 2016-11-30 ENCOUNTER — Ambulatory Visit (INDEPENDENT_AMBULATORY_CARE_PROVIDER_SITE_OTHER): Payer: BLUE CROSS/BLUE SHIELD | Admitting: Internal Medicine

## 2016-11-30 ENCOUNTER — Ambulatory Visit (INDEPENDENT_AMBULATORY_CARE_PROVIDER_SITE_OTHER)
Admission: RE | Admit: 2016-11-30 | Discharge: 2016-11-30 | Disposition: A | Discharge status: Home / Self Care | Payer: BLUE CROSS/BLUE SHIELD | Source: Ambulatory Visit | Attending: Internal Medicine | Admitting: Internal Medicine

## 2016-11-30 VITALS — BP 144/82 | HR 67 | Ht 70.0 in | Wt 191.0 lb

## 2016-11-30 DIAGNOSIS — R05 Cough: Secondary | ICD-10-CM | POA: Diagnosis not present

## 2016-11-30 DIAGNOSIS — J449 Chronic obstructive pulmonary disease, unspecified: Secondary | ICD-10-CM

## 2016-11-30 DIAGNOSIS — R0600 Dyspnea, unspecified: Secondary | ICD-10-CM | POA: Diagnosis not present

## 2016-11-30 DIAGNOSIS — R058 Other specified cough: Secondary | ICD-10-CM

## 2016-11-30 MED ORDER — AZELASTINE-FLUTICASONE 137-50 MCG/ACT NA SUSP: 1.0000 | Freq: Every day | NASAL | 11 refills | Status: DC

## 2016-11-30 MED ORDER — AZELASTINE-FLUTICASONE 137-50 MCG/ACT NA SUSP: 1.0000 | Freq: Every day | NASAL | 0 refills | Status: DC

## 2016-11-30 NOTE — Patient Instructions (Addendum)
Try taking aciphex Take 30-60 min before last meal of the day   Add pepcid 20 mg at bedtime to see if some of your night time symptoms improve or not  GERD (REFLUX)  is an extremely common cause of respiratory symptoms just like yours , many times with no obvious heartburn at all.    It can be treated with medication, but also with lifestyle changes including elevation of the head of your bed (ideally with 6 inch  bed blocks),  Smoking cessation, avoidance of late meals, excessive alcohol, and avoid fatty foods, chocolate, peppermint, colas, red wine, and acidic juices such as orange juice.  NO MINT OR MENTHOL PRODUCTS SO NO COUGH DROPS   USE SUGARLESS CANDY INSTEAD (Jolley ranchers or Stover's or Life Savers) or even ice chips will also do - the key is to swallow to prevent all throat clearing. NO OIL BASED VITAMINS - use powdered substitutes.  Try dymista one twice daily to see if helps the runny nose issues and if not no need to fill the prescription   Please remember to go to the  x-ray department downstairs in the basement  for your tests - we will call you with the results when they are available.     Please schedule a follow up office visit in 6 weeks, call sooner if needed with full pfts on return

## 2016-11-30 NOTE — Progress Notes (Signed)
Subjective:    Patient ID: Daniel Carey, male    DOB: [birth year],   MRN: [id number]  HPI  50  yowm  Quit smoking 2013  Frequent Conservation officer, nature  including international with pattern of increased frequency and severity typically in winter x 2008 and since 2010 noted some streaky blood quantity more pronounced especially since abrupt onset mid March 15th 2013 referred 07/28/2011 to pulmonary clinic.   07/28/2011 1st pulmonary ov cc persistent hemoptysis x 3 weeks better transiently  p avelox and nasal drainage esp lie down occ with epistaxis and chest discomfort from coughing.  Mucus is mostly white, blood is less a tbsp per day now. No sob though not aerobically active rec Augmentin 875 twice daily bfast and supper, yogurt for lunch - take for 10 days then call for sinus ct scan if not better Take mucinex dm 1200mg   every 12 hours and supplement if needed with  tramadol 50 mg up to 1-2   every 4 hours to suppress the urge to cough. Swallowing water or using ice chips/non mint and menthol containing candies (such as lifesavers or sugarless jolly ranchers) are also effective.  You should rest your voice and avoid activities that you know make you cough. Once you have eliminated the cough for 3 straight days try reducing the tramadol first,  then the delsym as tolerated.   Try acidphex 20mg   Take 30-60 min before first meal of the day and Pepcid 20 mg one bedtime until cough is completely gone for at least a week without the need for cough suppression Prednisone 10 mg take  4 each am x 2 days,   2 each am x 2 days,  1 each am x2days and stop     08/13/11  FOB neg findings/ neg cytology    11/30/2016   ov/[name] re: seen as "new pt " with new concern = doe  Chief Complaint  Patient presents with  . Advice Only    last seen 07/2011- c/o worsening sob, wheezing.   indolent doe gradually worse x 5 years but still MMRC1 = can walk nl pace, flat grade, can't hurry or go uphills  or steps s sob  (fixed/ proportionate pattern of doe or wheeze with exertion  Sleeps on  cpap most nights maybe once a week wakes up feeling he is breathing fast, can't get a deep breath x 10 minutes resolves spontaneously   Also watery rhinitis x one year no better with zyrtec / "only when working"  Never hs or with meals/ wife hears wheezing/ pt not aware   No obvious other patterns in day to day or daytime variability or assoc excess/ purulent sputum or mucus plugs or hemoptysis or cp or chest tightness,   overt hb symptoms. No unusual exp hx or h/o childhood pna/ asthma or knowledge of premature birth.  Sleeping ok on cpap most nights without early am  exacerbation  of respiratory  c/o's or need for noct saba. Also denies any obvious fluctuation of symptoms with weather or environmental changes or other aggravating or alleviating factors except as outlined above   Current Medications, Allergies, Complete Past Medical History, Past Surgical History, Family History, and Social History were reviewed in [employer] record.             Review of Systems  Constitutional: Negative for fever and unexpected weight change.  HENT: Negative for congestion, dental problem, ear pain, nosebleeds, postnasal drip, rhinorrhea, sinus  pressure, sneezing, sore throat and trouble swallowing.   Eyes: Negative for redness and itching.  Respiratory: Positive for shortness of breath and wheezing. Negative for cough and chest tightness.   Cardiovascular: Negative for palpitations and leg swelling.  Gastrointestinal: Negative for nausea and vomiting.  Genitourinary: Negative for dysuria.  Musculoskeletal: Negative for joint swelling.  Skin: Negative for rash.  Neurological: Negative for headaches.  Hematological: Does not bruise/bleed easily.  Psychiatric/Behavioral: Negative for dysphoric mood. The patient is not nervous/anxious.        Objective:   Physical Exam  amb pleasant wm  nad with mild pseudowheeze   Wt Readings from Last 3 Encounters:  11/30/16 191 lb (86.6 kg)  11/17/16 190 lb 9.6 oz (86.5 kg)  11/05/16 188 lb (85.3 kg)    Vital signs reviewed  -  - Note on arrival 02 sats  97% on RA      HEENT: nl dentition, turbinates bilaterally, and oropharynx. Nl external ear canals without cough reflex   NECK :  without JVD/Nodes/TM/ nl carotid upstrokes bilaterally   LUNGS: no acc muscle use,  Nl contour chest which is clear to A and P bilaterally without cough on insp or exp maneuvers   CV:  RRR  no s3 or murmur or increase in P2, and no edema   ABD:  soft and nontender with nl inspiratory excursion in the supine position. No bruits or organomegaly appreciated, bowel sounds nl  MS:  Nl gait/ ext warm without deformities, calf tenderness, cyanosis or clubbing No obvious joint restrictions   SKIN: warm and dry without lesions    NEURO:  alert, approp, nl sensorium with  no motor or cerebellar deficits apparent.    CXR PA and Lateral:   11/30/2016 :    I personally reviewed images and agree with radiology impression as follows:    The heart size and mediastinal contours are within normal limits. Both lungs are clear. The visualized skeletal structures are unremarkable.       Assessment & Plan:

## 2016-12-01 NOTE — Assessment & Plan Note (Signed)
Upper airway cough syndrome (previously labeled PNDS) , is  so named because it's frequently impossible to sort out how much is  CR/sinusitis with freq throat clearing (which can be related to primary GERD)   vs  causing  secondary (" extra esophageal")  GERD from wide swings in gastric pressure that occur with throat clearing, often  promoting self use of mint and menthol lozenges that reduce the lower esophageal sphincter tone and exacerbate the problem further in a cyclical fashion.   These are the same pts (now being labeled as having "irritable larynx syndrome" by some cough centers) who not infrequently have a history of having failed to tolerate ace inhibitors,  dry powder inhalers or biphosphonates or report having atypical/extraesophageal reflux symptoms that don't respond to standard doses of PPI  and are easily confused as having aecopd or asthma flares by even experienced allergists/ pulmonologists (myself included).    rec trial of max rx for gerd and dymista then regroup with ? Need for allergy w/u/ sinus ct on return

## 2016-12-01 NOTE — Assessment & Plan Note (Addendum)
Quit smoking 2013  Spirometry 11/30/2016  FEV1 1.40 (41%)  Ratio 57   Although he clearly has significant copd, when respiratory symptoms begin   well after a patient reports complete smoking cessation,  Especially when this wasn't the case while they were smoking, a red flag is raised based on the work of Dr Kris Mouton which states:  if you quit smoking when your best day FEV1 is still  preserved it is highly unlikely you will progress to severe disease.  That is to say, once the smoking stops,  the symptoms should not   markedly worsen.  If so, the differential diagnosis should include  obesity/deconditioning,  LPR/Reflux/Aspiration syndromes, or  especially side effect of medications commonly used in this population,  occult CHF or IHD - note LHC reviewd from 11/05/16 1.  Multivessel coronary artery disease, including diffuse calcified LAD disease of up to 60-70% (FFR 0.73), moderate to severe LCx and OM3 disease (FFR 0.83 at submaximal hyperemia), and moderate in-stent restenosis of mid RCA. 2. Normal left ventricular filling pressure. 3    Normal left ventricular systolic function.   He is definitely a candidate for lama or lama/laba (prefer the form in view of IHD ) but since these symptoms are so chronic and there is pseudowheeze prominently on exam rec   1) start max gerd rx diet  2) paced activity as allowed by cards 3) add dymista for uacs (see separate a/p)  4) return for full pfts in 6 weeks   Total time devoted to counseling  > 50 % of initial 60 min office visit:  review case with pt/ discussion of options/alternatives/ personally creating written customized instructions  in presence of pt  then going over those specific  Instructions directly with the pt including how to use all of the meds but in particular covering each new medication in detail and the difference between the maintenance= "automatic" meds and the prns using an action plan format for the latter (If this problem/symptom  => do that organization reading Left to right).  Please see AVS from this visit for a full list of these instructions which I personally wrote for this pt and  are unique to this visit.

## 2016-12-02 ENCOUNTER — Other Ambulatory Visit: Payer: Self-pay | Admitting: Cardiovascular Disease

## 2016-12-03 ENCOUNTER — Ambulatory Visit: Payer: BLUE CROSS/BLUE SHIELD | Admitting: Internal Medicine

## 2016-12-07 ENCOUNTER — Telehealth: Payer: Self-pay | Admitting: Internal Medicine

## 2016-12-07 NOTE — Telephone Encounter (Signed)
Any openings this week?  Or okay to reschedule with next opening?

## 2016-12-07 NOTE — Telephone Encounter (Signed)
Patient's wife called in reference to needing to cancel and reschedule his appointment that was on 01/05/17 @ 11:15am. Patient is not able to make this appointment due to work and would like to know if he can be worked in this week to see Dr. Renne Crigler. Please call patient and advise.

## 2016-12-08 ENCOUNTER — Encounter: Payer: Self-pay | Admitting: Physician Assistant

## 2016-12-08 ENCOUNTER — Encounter: Payer: Self-pay | Admitting: Internal Medicine

## 2016-12-08 ENCOUNTER — Telehealth: Payer: Self-pay

## 2016-12-08 ENCOUNTER — Ambulatory Visit (INDEPENDENT_AMBULATORY_CARE_PROVIDER_SITE_OTHER): Payer: BLUE CROSS/BLUE SHIELD | Admitting: Physician Assistant

## 2016-12-08 ENCOUNTER — Ambulatory Visit (INDEPENDENT_AMBULATORY_CARE_PROVIDER_SITE_OTHER): Payer: BLUE CROSS/BLUE SHIELD | Admitting: Internal Medicine

## 2016-12-08 VITALS — BP 140/66 | HR 64 | Ht 69.0 in | Wt 190.0 lb

## 2016-12-08 VITALS — BP 138/82 | HR 65 | Ht 69.0 in | Wt 190.0 lb

## 2016-12-08 DIAGNOSIS — Z86010 Personal history of colonic polyps: Secondary | ICD-10-CM | POA: Diagnosis not present

## 2016-12-08 DIAGNOSIS — Z7901 Long term (current) use of anticoagulants: Secondary | ICD-10-CM | POA: Diagnosis not present

## 2016-12-08 DIAGNOSIS — E039 Hypothyroidism, unspecified: Secondary | ICD-10-CM | POA: Diagnosis not present

## 2016-12-08 DIAGNOSIS — Z1211 Encounter for screening for malignant neoplasm of colon: Secondary | ICD-10-CM

## 2016-12-08 DIAGNOSIS — E119 Type 2 diabetes mellitus without complications: Secondary | ICD-10-CM

## 2016-12-08 LAB — TSH: TSH: 4.89 u[IU]/mL — ABNORMAL HIGH (ref 0.35–4.50)

## 2016-12-08 LAB — T4, FREE: Free T4: 0.83 ng/dL (ref 0.60–1.60)

## 2016-12-08 LAB — T3, FREE: T3, Free: 3.3 pg/mL (ref 2.3–4.2)

## 2016-12-08 LAB — POCT GLYCOSYLATED HEMOGLOBIN (HGB A1C): Hemoglobin A1C: 6.3

## 2016-12-08 MED ORDER — NA SULFATE-K SULFATE-MG SULF 17.5-3.13-1.6 GM/177ML PO SOLN: ORAL | 0 refills | Status: DC

## 2016-12-08 NOTE — Patient Instructions (Signed)
.  Please stop at the lab..  If we need to start Levothyroxine, take the thyroid hormone every day, with water, at least 30 minutes before breakfast, separated by at least 4 hours from: - acid reflux medications - calcium - iron - multivitamins  Please come back for a follow-up appointment in 6 months.

## 2016-12-08 NOTE — Progress Notes (Signed)
Patient ID: Daniel Carey, male   DOB: 11-12-1950, 66 y.o.   MRN: [id number]   HPI  Daniel Carey is a 66 y.o.-year-old male, returning for f/u for hypothyroidism, dx 2012 and DM2, diet-controlled, non-insulin-dependent, with complications (CAD). Last visit 9 mo ago.  Pt. has been dx with hypothyroidism in 2012 >> he was on Levothyroxine up to 200 mcg in 2016 >> did not feel well >> stopped.  We are following him w/o med per his preference and as his TSH is fluctuating close to the ULN.  I reviewed pt's thyroid tests: Lab Results  Component Value Date   TSH 4.13 03/06/2016   TSH 9.84 (H) 09/05/2015   TSH 0.23 (L) 05/07/2015   TSH 2.49 08/02/2014   TSH 4.695 (H) 10/26/2013   TSH 1.56 06/08/2012   TSH 6.83 (H) 07/20/2011   TSH 5.61 (H) 07/14/2011   TSH 5.53 (H) 12/05/2010   TSH 3.07 02/13/2010   FREET4 0.67 03/06/2016   FREET4 0.66 09/05/2015   FREET4 1.25 05/07/2015   FREET4 0.79 08/02/2014    Pt denies: - feeling nodules in neck - hoarseness - dysphagia - choking - SOB with lying down  He sees Dr Acie Fredrickson >> had angioplasy 3 times in the past, 2 stents in last year. He had an AMI 1996 (mild). He has a Mobitz 1 AVB. Had a catheterization in 10/2016 >> partial blockages. Started Crestor and Imdur.  DM2: - last HbA1c higher after the Holidays: 07/03/2016: HbA1c 6.8% Lab Results  Component Value Date   HGBA1C 7.1 (H) 09/05/2015   HGBA1C 6.7 (H) 05/07/2015   HGBA1C 6.1 11/08/2014   Sugars at home - checks 1x a day: - am: n/c - 2h after b'fast: n/c - lunch: n/c - 2h after lunch: 85-140 - dinner: 113, 159 - 2h after dinner: 87-154 - bedtime: n/c  He started to use Splenda and limit sugars.  No CKD. Last BUN/Cr: Lab Results  Component Value Date   BUN 15 11/17/2016   BUN 15 11/05/2016   BUN 12 11/04/2016   CREATININE 1.21 11/17/2016   CREATININE 1.10 11/05/2016   CREATININE 1.08 11/04/2016  Had high potassium recently >> not on ACEI or ARB. Last  potassium: Lab Results  Component Value Date   K 5.4 (H) 11/17/2016   K 4.8 11/05/2016   K 5.6 (H) 11/04/2016   K 4.9 01/09/2016   K 4.5 07/19/2015   Last cholesterol level: Lab Results  Component Value Date   CHOL 152 11/17/2016   HDL 50 11/17/2016   LDLCALC 82 11/17/2016   LDLDIRECT 107.0 02/27/2013   TRIG 98 11/17/2016   CHOLHDL 3.0 11/17/2016  Off Lipitor >> now on Crestor 20. On Fenofibrate. Last eye exam in 09/2015 >> No DR  He stays active playing golf.  He has OSA >> wears a C-pap.   ROS: Constitutional: no weight gain/no weight loss, no fatigue, no subjective hyperthermia, no subjective hypothermia Eyes: no blurry vision, no xerophthalmia ENT: no sore throat, no nodules palpated in throat, no dysphagia, no odynophagia, no hoarseness Cardiovascular: no CP/no SOB/no palpitations/no leg swelling Respiratory: no cough/no SOB/no wheezing Gastrointestinal: no N/no V/no D/no C/no acid reflux Musculoskeletal: no muscle aches/no joint aches Skin: no rashes, no hair loss Neurological: no tremors/no numbness/no tingling/no dizziness  I reviewed pt's medications, allergies, PMH, social hx, family hx, and changes were documented in the history of present illness. Otherwise, unchanged from my initial visit note.  Past Medical History:  Diagnosis Date  .  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 CAD (coronary artery disease)    moderate to severe distal CAD with large epicardial arteries having only mild to moderate irregularities. Last cath in Dec 2006; s/p cath with Promus DES in distal RCA and Promus DES in the mid vessel in an overlapping fashion  . Chest pain   . COPD (chronic obstructive pulmonary disease) ([place])   . ED (erectile dysfunction)   . Esophageal stricture   . External hemorrhoids without mention of complication   . GERD (gastroesophageal reflux disease)   . Headache(784.0)   . Hiatal hernia   . HLD (hyperlipidemia)   . HTN (hypertension)   .  Nephrolithiasis   . Personal history of colonic polyps 03/12/2010   TUBULAR ADENOMA  . Tobacco abuse    Past Surgical History:  Procedure Laterality Date  . ANGIOPLASTY     X3  . LEFT HEART CATH AND CORONARY ANGIOGRAPHY N/A 11/05/2016   Procedure: Left Heart Cath and Coronary Angiography;  Surgeon: Nelva Bush, MD;  Location: [facility];  Service: Cardiovascular;  Laterality: N/A;  . PERCUTANEOUS CORONARY ROTOBLATOR INTERVENTION (PCI-R) N/A 06/30/2012   Procedure: PERCUTANEOUS CORONARY ROTOBLATOR INTERVENTION (PCI-R);  Surgeon: Burnell Blanks, MD;  Location: [facility] CATH LAB;  Service: Cardiovascular;  Laterality: N/A;  . VIDEO BRONCHOSCOPY  08/13/2011   Procedure: VIDEO BRONCHOSCOPY WITHOUT FLUORO;  Surgeon: Tanda Rockers, MD;  Location: [facility];  Service: Cardiopulmonary;  Laterality: Bilateral;   History   Social History  . 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 Smoking status: Former Smoker -- 0.50 packs/day for 40 years    Types: Cigarettes    Quit date: 08/25/2011  . Smokeless tobacco: Never Used  . Alcohol Use: 0.0 oz/week     Comment: rare  . Drug Use: No   Current Outpatient Prescriptions on File Prior to Visit  Medication Sig Dispense Refill  . aspirin EC 81 MG tablet Take 81 mg by mouth daily.    . Azelastine-Fluticasone (DYMISTA) 137-50 MCG/ACT SUSP Place 1-2 sprays into the nose daily. 23 g 11  . Choline Fenofibrate 135 MG capsule Take 1 capsule (135 mg total) by mouth daily. 90 capsule 3  . clopidogrel (PLAVIX) 75 MG tablet Take 75 mg by mouth daily.    . famotidine (PEPCID) 20 MG tablet Take 20 mg by mouth daily as needed for heartburn.    . hydrochlorothiazide (HYDRODIURIL) 25 MG tablet Take 0.5 tablets (12.5 mg total) by mouth daily. 45 tablet 3  . isosorbide mononitrate (IMDUR) 30 MG 24 hr tablet Take 1 tablet (30 mg total) by  mouth daily. 30 tablet 11  . metoprolol tartrate (LOPRESSOR) 25 MG tablet Take 1 tablet (25 mg total) by mouth 2 (two) times daily. 180 tablet 3  . naproxen sodium (ANAPROX) 220 MG tablet Take 220 mg by mouth daily as needed (headache).    . nitroGLYCERIN (NITROSTAT) 0.4 MG SL tablet Place 1 tablet (0.4 mg total) under the tongue every 5 (five) minutes as needed for chest pain. 25 tablet 4  . PROAIR HFA 108 (90 Base) MCG/ACT inhaler INHALE 2 PUFFS INTO THE LUNGS EVERY 6 (SIX) HOURS AS NEEDED FOR WHEEZING OR SHORTNESS OF BREATH. 8.5 Inhaler 0  . RABEprazole (ACIPHEX) 20 MG tablet Take 20 mg by mouth  daily.    . rosuvastatin (CRESTOR) 20 MG tablet Take 1 tablet (20 mg total) by mouth daily. 90 tablet 3   No current facility-administered medications on file prior to visit.    Allergies  Allergen Reactions  . Lisinopril Cough and Rash  . Amlodipine Besylate Other (See Comments)    Headache, weakness, dizziness  . Propofol Other (See Comments)    Heart rate dropped  . Rosuvastatin Other (See Comments)     muscle aches   Family History  Problem Relation Age of Onset  . Heart disease Father        hx of MI  . Diabetes Mother        and brother   . Kidney failure Mother   . Breast cancer Sister   . Colon cancer Neg Hx   . Stomach cancer Neg Hx    PE: BP 138/82 (BP Location: Left Arm, Patient Position: Sitting)   Pulse 65   Ht 5\' 9"  (1.753 m)   Wt 190 lb (86.2 kg)   SpO2 95%   BMI 28.06 kg/m  Wt Readings from Last 3 Encounters:  12/08/16 190 lb (86.2 kg)  11/30/16 191 lb (86.6 kg)  11/17/16 190 lb 9.6 oz (86.5 kg)   Constitutional: slightly overweight, in NAD Eyes: PERRLA, EOMI, no exophthalmos ENT: moist mucous membranes, no thyromegaly, no cervical lymphadenopathy Cardiovascular: RRR, No MRG Respiratory: CTA B Gastrointestinal: abdomen soft, NT, ND, BS+ Musculoskeletal: no deformities, strength intact in all 4 Skin: moist, warm, no rashes Neurological: no tremor with  outstretched hands, DTR normal in all 4  ASSESSMENT: 1. Mild hypothyroidism  2. DM2, diet- controlled - + CAD  PLAN:  1. Patient with few years of mild hypothyroidism, off LT4.  - he appears euthyroid and has no complaints - his TFTs fluctuate, at last check >> normal, but it has been close to 10 in the past - he is very Reticent to start any medication, and would like to avoid levothyroxine due to previous experience with the medication. He had an intolerance to levothyroxine, but I believe this was secondary to the high dose used. - We will check his TFTs today. TSH result is above 10 or he has symptoms of hypothyroidism, we discussed to start 25 g levothyroxine to avoid possible side effects of hypothyroidism - Will follow-up with him in 6 months   2. DM2, controlled - last HbA1c was 6.8%. Today, HbA1c is 6.3%  - He is still very reticent to start medicines, but will need to start Metformin if his his sugars increase. As off now, after he improved his diet >> there is no need to add medications. - continue checking sugars at different times of the day - check 1x a day, rotating checks - advised for yearly eye exams >> he needs one - Return to clinic in 6 mo with sugar log   Component     Latest Ref Rng & Units 12/08/2016  TSH     0.35 - 4.50 uIU/mL 4.89 (H)  T4,Free(Direct)     0.60 - 1.60 ng/dL 0.83  Triiodothyronine,Free,Serum     2.3 - 4.2 pg/mL 3.3  Hemoglobin A1C      6.3   TSH slightly high >> no LT4 needed for now.  Philemon Kingdom, MD PhD [facility] Endocrinology

## 2016-12-08 NOTE — Progress Notes (Addendum)
Subjective:    Patient ID: Daniel Carey, male    DOB: 1950/08/31, 66 y.o.   MRN: [id number]  HPI Daniel Carey is a 66 year old white male, previously known to Dr. Sharlett Iles who comes in today to discuss follow-up colonoscopy which has been scheduled with Dr. Hilarie Fredrickson for September 12. Patient last had colonoscopy March 2013 for history of polyps. He had 2 small polyps removed both of which were tubular adenomas and was recommended for 5 year interval follow-up. He also has left-sided diverticulosis. Patient is on chronic Plavix and aspirin, with history of coronary artery disease status post drug-eluting stent in 2014. He also has history of AV block Mobitz 1, obstructive sleep apnea, GERD hypertension, and COPD-no oxygen requirement. Patient had recent cardiac catheterization in July 2018 due to anginal complaints. This showed moderate to severe 3 vessel coronary disease. He was started on Imdur with plan for referral to CVT S if he did not have significant improvement in his symptoms. He is followed by Dr.Nahser. Patient says that the indoor has helped and he has not been having any recent anginal symptoms the past couple of weeks. Both the patient and his wife state that he also had marked bradycardia with propofol when given for colonoscopy in 2013 and that he cannot have Propofol for sedation. Patient states that his pulse stays on the low side. He has no current GI complaints, specifically no problems with abdominal pain and changes in bowel habits melena or hematochezia.  Review of Systems Pertinent positive and negative review of systems were noted in the above HPI section.  All other review of systems was otherwise negative.  Outpatient Encounter Prescriptions as of 12/08/2016  Medication Sig  . aspirin EC 81 MG tablet Take 81 mg by mouth daily.  . Azelastine-Fluticasone (DYMISTA) 137-50 MCG/ACT SUSP Place 1-2 sprays into the nose daily.  . Choline Fenofibrate 135 MG capsule Take 1 capsule  (135 mg total) by mouth daily.  . clopidogrel (PLAVIX) 75 MG tablet Take 75 mg by mouth daily.  . famotidine (PEPCID) 20 MG tablet Take 20 mg by mouth daily as needed for heartburn.  . hydrochlorothiazide (HYDRODIURIL) 25 MG tablet Take 0.5 tablets (12.5 mg total) by mouth daily.  . isosorbide mononitrate (IMDUR) 30 MG 24 hr tablet Take 1 tablet (30 mg total) by mouth daily.  . metoprolol tartrate (LOPRESSOR) 25 MG tablet Take 1 tablet (25 mg total) by mouth 2 (two) times daily.  . naproxen sodium (ANAPROX) 220 MG tablet Take 220 mg by mouth daily as needed (headache).  . nitroGLYCERIN (NITROSTAT) 0.4 MG SL tablet Place 1 tablet (0.4 mg total) under the tongue every 5 (five) minutes as needed for chest pain.  Marland Kitchen PROAIR HFA 108 (90 Base) MCG/ACT inhaler INHALE 2 PUFFS INTO THE LUNGS EVERY 6 (SIX) HOURS AS NEEDED FOR WHEEZING OR SHORTNESS OF BREATH.  . RABEprazole (ACIPHEX) 20 MG tablet Take 20 mg by mouth daily.  . rosuvastatin (CRESTOR) 20 MG tablet Take 1 tablet (20 mg total) by mouth daily.  . Na Sulfate-K Sulfate-Mg Sulf 17.5-3.13-1.6 GM/180ML SOLN Take as directed for colonoscopy.   No facility-administered encounter medications on file as of 12/08/2016.    Allergies  Allergen Reactions  . Lisinopril Cough and Rash  . Amlodipine Besylate Other (See Comments)    Headache, weakness, dizziness  . Propofol Other (See Comments)    Heart rate dropped  . Rosuvastatin Other (See Comments)     muscle aches   Patient Active Problem List  Diagnosis Date Noted  . DOE (dyspnea on exertion) 07/19/2015  . Controlled type 2 diabetes mellitus without complication, without long-term current use of insulin ([place]) 05/07/2015  . Obstructive sleep apnea 04/26/2014  . Hypothyroidism 10/23/2013  . Acid reflux 08/30/2013  . Malaise and fatigue 08/30/2013  . 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 Upper airway cough syndrome 07/29/2011  . Hemoptysis 07/29/2011  . Heart block 07/28/2011  . Other abnormal  glucose 07/20/2011  . AV block, Mobitz 1 07/14/2011  . Benign neoplasm of colon 07/08/2011  . Gastritis 07/08/2011  . Diverticulosis of colon (without mention of hemorrhage) 07/08/2011  . Routine general medical examination at a health care facility 12/05/2010  . TOBACCO USE 03/10/2010  . SINUSITIS, CHRONIC 02/13/2010  . HIATAL HERNIA 12/22/2007  . Hyperlipidemia 12/30/2006  . Essential hypertension 12/30/2006  . CAD (coronary artery disease) 12/30/2006  . COPD GOLD III  12/30/2006  . GERD 12/30/2006   Social History   Social History  . Marital status: Married    Spouse name: N/A  . Number of children: 1  . 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 Smoking status: Former Smoker    Packs/day: 0.50    Years: 40.00    Types: Cigarettes    Quit date: 08/25/2011  . Smokeless tobacco: Never Used  . Alcohol use 0.0 oz/week     Comment: rare  . Drug use: No  . Sexual activity: Not Currently   Other Topics Concern  . Not on file   Social History Narrative  . No narrative on file    Daniel Carey's family history includes Breast cancer in his sister; Diabetes in his mother; Heart disease in his father; Kidney failure in his mother.      Objective:    Vitals:   12/08/16 1504  BP: 140/66  Pulse: 64    Physical Exam  well-developed older white male in no acute distress, accompanied by his wife blood pressure 140/66 pulse 64, height 5 foot 9, weight 190, BMI 28.0. HEENT ;nontraumatic normocephalic EOMI PERRLA sclera anicteric, Cardiovascular; regular rate and rhythm with S1-S2. Pulmonary ;clear bilaterally, Abdomen; obese, soft nontender nondistended L sounds active no palpable mass or hepatosplenomegaly, Rectal; exam not done, Extremities; no clubbing cyanosis or edema skin warm and dry, Neuropsych; mood and affect appropriate       Assessment & Plan:   #69 66 year old white male with history of tubular  adenomatous colon polyps due for follow-up colonoscopy, asymptomatic #2 chronic antiplatelet therapy-on Plavix and aspirin #3 coronary artery disease status post drug-eluting stent 2014 #4 recent cardiac catheterization done for anginal symptoms showing moderate to severe three-vessel coronary disease, Imdur  started and surgery to be considered #5 history of significant bradycardia with propofol for colonoscopy 2013 #6 COPD Gold 3 #7 obstructive sleep apnea #8 history of AV block Mobitz 1 #8 hypertension #9 history of  Propofol-induced bradycardia  Plan; With recent cardiac cath showing moderate to severe three-vessel coronary artery disease, patient has increased risk for complications with sedation and colonoscopy. This was discussed with the patient and his wife. They are definitely interested in  having another colonoscopy because of concerns with history of precancerous polyps. Patient will need Versed/fentanyl for sedation, and not propofol. I explained carefully that there is risk for bradycardia with any form of sedation. Because of recent catheterization findings explained it would be necessary to speak with his cardiologist, Dr. Acie Fredrickson  to see if he feels delaying colonoscopy would be prudent. For now we'll leave him scheduled for colonoscopy for September 12 with Dr. Hilarie Fredrickson. Assuming we are moving forward with colonoscopy will need to hold Plavix for 5 days prior to the procedure, he may stay on baby aspirin. I will communicate this to his cardiologist as well.,to assure this is reasonable for this patient.  Plan;  Alfredia [name] PA-C 12/08/2016   Cc: Dione Housekeeper, MD   Addendum: Reviewed and agree with initial management. Pyrtle, Lajuan Lines, MD

## 2016-12-08 NOTE — Telephone Encounter (Signed)
Called patient and advised if they could come at 2:30 today, patient stated they would be here.Daniel Carey is putting them in.

## 2016-12-08 NOTE — Telephone Encounter (Signed)
Can he come today at 1:30? Or if not, at 2:30?

## 2016-12-08 NOTE — Telephone Encounter (Signed)
Called patient and advised if they could come at 2:30 today, patient stated they would be here.

## 2016-12-08 NOTE — Patient Instructions (Signed)
You have been scheduled for a colonoscopy. Please follow written instructions given to you at your visit today.  Please pick up your prep supplies at the pharmacy within the next 1-3 days. If you use inhalers (even only as needed), please bring them with you on the day of your procedure. Your physician has requested that you go to www.startemmi.com and enter the access code given to you at your visit today. This web site gives a general overview about your procedure. However, you should still follow specific instructions given to you by our office regarding your preparation for the procedure.  Amy Esterwood PA is sending a note to Dr. Mertie Moores regarding the Plavix medication. We will call you with his directions regarding the Plavix before the colonoscopy.

## 2016-12-10 NOTE — Telephone Encounter (Signed)
-----   Message from Alfredia [name], PA-C sent at 12/10/2016 12:25 PM EDT ----- Regarding: FW: Colonoscopy  risk Ulice Dash - please review my note on this pt. Cardiology feels he is ok to have Colonoscopy - no Propofol because of previous  bradycardia  With propofol. Beth - please call pt and let them know DR Nahser gave "OK" for colonoscopy- and hold plavix for 5 days prior to colon-. thanks ----- Message ----- From: Thayer Headings, MD Sent: 12/09/2016   5:21 PM To: Amy S Esterwood, PA-C Subject: RE: Colonoscopy  risk                          Mr. Vanhook is at low risk for colonoscopy He may hold his plavix for 5 days prior to colonoscopy. He should continue ASA if possible.    ----- Message ----- From: Alfredia [name], PA-C Sent: 12/08/2016   5:22 PM To: Jerene Bears, MD, Thayer Headings, MD Subject: Colonoscopy  risk                              Dr Acie Fredrickson- please review this pt's recent hx with Cath 10/2016 showing mod- to severe 3 vessel disease  Now on Imdur for angina -helping - also on Plavix /ASA for prior stent. Pt is due for follow up Colonoscopy which is not urgent at all. He also had significant bradycardia with propofol with last Colon.   Do you think Colonoscopy  Is reasonable or would it be  prudent to delay  given recent cath findings?, and symptomatic with angina which has improved with Imdur Also need your blessing to hold Plavix x 5 days.  Thank-you!

## 2016-12-10 NOTE — Telephone Encounter (Signed)
DPR on file. Mrs. Goree takes the information for the patient.  Hold Plavix 5 days prior. Continue ASA. Colonoscopy already scheduled.

## 2016-12-17 ENCOUNTER — Telehealth: Payer: Self-pay | Admitting: *Deleted

## 2016-12-17 NOTE — Telephone Encounter (Signed)
I have spoken to patient to advise that per Dr Acie Fredrickson, low risk for colonoscopy, may hold plavix 5 days prior to his procedure but that he should stay ON aspirin for the procedure. Patient verbalizes understanding of all of the above.

## 2016-12-17 NOTE — Telephone Encounter (Signed)
-----   Message from Alfredia [name], PA-C sent at 12/17/2016 12:05 PM EDT ----- Regarding: FW: Colonoscopy  risk Here is the note from Dr Acie Fredrickson regarding Plavix for this pt - please contact pt to let him know Dr Kristopher Oppenheim was fine with him having procedure and holding Plavix for 5 days prior  To Colon. Thanks ----- Message ----- From: Thayer Headings, MD Sent: 12/09/2016   5:21 PM To: Amy S Esterwood, PA-C Subject: RE: Colonoscopy  risk                          Mr. Slone is at low risk for colonoscopy He may hold his plavix for 5 days prior to colonoscopy. He should continue ASA if possible.    ----- Message ----- From: Alfredia [name], PA-C Sent: 12/08/2016   5:22 PM To: Jerene Bears, MD, Thayer Headings, MD Subject: Colonoscopy  risk                              Dr Acie Fredrickson- please review this pt's recent hx with Cath 10/2016 showing mod- to severe 3 vessel disease  Now on Imdur for angina -helping - also on Plavix /ASA for prior stent. Pt is due for follow up Colonoscopy which is not urgent at all. He also had significant bradycardia with propofol with last Colon.   Do you think Colonoscopy  Is reasonable or would it be  prudent to delay  given recent cath findings?, and symptomatic with angina which has improved with Imdur Also need your blessing to hold Plavix x 5 days.  Thank-you!

## 2016-12-18 ENCOUNTER — Telehealth: Payer: Self-pay | Admitting: Certified Registered Nurse Anesthetist

## 2016-12-18 DIAGNOSIS — Z86010 Personal history of colonic polyps: Secondary | ICD-10-CM

## 2016-12-18 NOTE — Telephone Encounter (Signed)
Would you please review Mr Daniel Carey latest cardiology visit and his request for no propofol if he would be better served being done in a hospital setting.  He is scheduled for 12/30/16.  Josh

## 2016-12-19 NOTE — Telephone Encounter (Signed)
Our nurse anesthesia has reviewed the case and has recommended we have this done at the hospital in the outpatient hospital setting. Please move his colonoscopy to next available hospital outpatient morning. He will need to change his instructions for holding Plavix appropriately. We have already received permission from his cardiologist

## 2016-12-22 ENCOUNTER — Other Ambulatory Visit: Payer: Self-pay | Admitting: *Deleted

## 2016-12-22 ENCOUNTER — Other Ambulatory Visit: Payer: Self-pay | Admitting: Cardiovascular Disease

## 2016-12-22 DIAGNOSIS — Z86010 Personal history of colonic polyps: Secondary | ICD-10-CM

## 2016-12-22 NOTE — Telephone Encounter (Signed)
Patient has been rescheduled to 02/16/17 at 8:30 am [facility] colonoscopy with moderate sedation. Patient has been advised of these changes as well as reasoning and agrees. He has been given updated time/date/location of procedure and I have sent updated instructions to his home address. I have also advised that he will now need to hold his plavix beginning 02/11/17 for his 02/16/17 procedure to which he also verbalizes understanding. All orders for hospital have been placed in EPIC.

## 2016-12-30 ENCOUNTER — Encounter: Payer: BLUE CROSS/BLUE SHIELD | Admitting: Internal Medicine

## 2017-01-05 ENCOUNTER — Other Ambulatory Visit: Payer: Self-pay | Admitting: Cardiovascular Disease

## 2017-01-05 ENCOUNTER — Ambulatory Visit: Payer: BLUE CROSS/BLUE SHIELD | Admitting: Internal Medicine

## 2017-01-11 ENCOUNTER — Ambulatory Visit (INDEPENDENT_AMBULATORY_CARE_PROVIDER_SITE_OTHER): Payer: BLUE CROSS/BLUE SHIELD | Admitting: Cardiovascular Disease

## 2017-01-11 ENCOUNTER — Ambulatory Visit: Payer: BLUE CROSS/BLUE SHIELD | Admitting: Internal Medicine

## 2017-01-11 ENCOUNTER — Encounter: Payer: Self-pay | Admitting: Cardiovascular Disease

## 2017-01-11 VITALS — BP 120/90 | HR 78 | Ht 70.0 in | Wt 191.8 lb

## 2017-01-11 DIAGNOSIS — I25118 Atherosclerotic heart disease of native coronary artery with other forms of angina pectoris: Secondary | ICD-10-CM | POA: Diagnosis not present

## 2017-01-11 DIAGNOSIS — I2510 Atherosclerotic heart disease of native coronary artery without angina pectoris: Secondary | ICD-10-CM | POA: Diagnosis not present

## 2017-01-11 DIAGNOSIS — E782 Mixed hyperlipidemia: Secondary | ICD-10-CM | POA: Diagnosis not present

## 2017-01-11 LAB — LIPID PANEL
Chol/HDL Ratio: 3.3 ratio (ref 0.0–5.0)
Cholesterol, Total: 165 mg/dL (ref 100–199)
HDL: 50 mg/dL (ref >39)
LDL Calculated: 82 mg/dL (ref 0–99)
Triglycerides: 165 mg/dL — ABNORMAL HIGH (ref 0–149)
VLDL Cholesterol Cal: 33 mg/dL (ref 5–40)

## 2017-01-11 LAB — BASIC METABOLIC PANEL
BUN/Creatinine Ratio: 17 (ref 10–24)
BUN: 16 mg/dL (ref 8–27)
CO2: 25 mmol/L (ref 20–29)
Calcium: 9.9 mg/dL (ref 8.6–10.2)
Chloride: 99 mmol/L (ref 96–106)
Creatinine, Ser: 0.94 mg/dL (ref 0.76–1.27)
GFR calc Af Amer: 98 mL/min/{1.73_m2} (ref >59)
GFR calc non Af Amer: 85 mL/min/{1.73_m2} (ref >59)
Glucose: 135 mg/dL — ABNORMAL HIGH (ref 65–99)
Potassium: 5.2 mmol/L (ref 3.5–5.2)
Sodium: 139 mmol/L (ref 134–144)

## 2017-01-11 LAB — HEPATIC FUNCTION PANEL
ALT: 20 IU/L (ref 0–44)
AST: 18 IU/L (ref 0–40)
Albumin: 4.5 g/dL (ref 3.6–4.8)
Alkaline Phosphatase: 68 IU/L (ref 39–117)
Bilirubin Total: 0.5 mg/dL (ref 0.0–1.2)
Bilirubin, Direct: 0.14 mg/dL (ref 0.00–0.40)
Total Protein: 7.1 g/dL (ref 6.0–8.5)

## 2017-01-11 NOTE — Progress Notes (Signed)
[name] Date of Birth  1951-03-27       Daniel Carey [address], [place], [place]  [address]   [place], [place]  [address] [phone]     [phone]   Fax  [phone]    Fax [phone]  Problem List: 1. CAD - 2.5 x 38 mm Promus Premier DES in the distal RCA and a 2.75 x 38 mm Promus Premier DES in the mid vessel in an overlapping fashion. The distal stent was post-dilated with a 2.5 x 20 mm [place] balloon. The proximal portion of the stent was post-dilated with a 3.0 x 20 mm [place] balloon ( June 30, 2012)  2. Bradycardia after getting propafol for endoscopy. 3. Obstructive sleep apnea:     History of Present Illness:  Daniel Carey is feeling well today.  He's been getting some exercise. He does yard work and walks on the treadmill occasionally. He had an episode of bradycardia after getting IV protocol for an endoscopy procedure. He was seen by [name]. His metoprolol dose was decreased. He has also had a bronchoscopy with [name]. He does not have any evidence of COPD.  Both he and his wife have stop smoking.  ( Aug 20, 2011)  Feb. 28, 2014:  He continues to have increasing chest pain.  He has not been smoking.  The pains are associated with exertion, last 5-10 minutes, resolve with 1 SL NTG.   He's had a mildly elevated TSH for the past year or so and has had some fatigue.    his TSH level 6.8.      His medical doctor started him on Synthroid. He gradually has titrated up to 0.1 mg a day. Since that time he's had increasing episodes of chest pain.  Aug 29, 2012:  [name] saw him in February we performed a cardiac catheterization because of increasing chest pain. He was found to have sequential stenosis in his mid right coronary artery and had PCI of his right coronary artery ( 2.5 x 38 mm Promus Premier DES in the distal RCA and a 2.75 x 38 mm Promus Premier DES in the mid vessel in an overlapping fashion. The  distal sten was post-dilated with a 2.5 x 20 mm [place] balloon. The proximal portion of the stent was post-dilated with a 3.0 x 20 mm  balloon)  He and his wife are concerned about whether or not the Plavix is active.  His generic plavix manufacturer has changed.   He would like to be tested.   October 14, 2012:  Daniel Carey has been having some occasional CP.  He has had some episodes of dyspnea while climbing up a hill playing golf.  This past Monday night, he had some chest pain - possible due to indigestions.  these  He had lost his NTG - he has refilled it now.    Nov. 10, 2014:  Daniel Carey is doing well.  He is working too much according to wife. he's had a few CP but not severe enough to take NTG.  Still has some dyspnea with climbing steps.   He had stenting in march 2014.  October 23, 2013:  Daniel Carey is doing well.   Playing golf.  Working in the yard.  Not exercising as much.   He has lost about 6 lbs.  He had labs drawn today.     Jan. 7, 2016:  He has been  waking up in the middle of the night and feels like he cant catch his  Breath. Still working out - on the treadmill twice this week and has done well.   Sept. 1, 2016:  Has had some short of breath. Does not get any exercise.  His CP is much better since starting CPAP .  Is having bloating  - thinks its due to the lipitor   July 19, 2015: Has had some shortness of breath.   With exertion . Happens occasionally but seem to be getting more frequently .  BP is elevated today  Has cut back on his salt .   Sept. 21, 2017:  Daniel Carey is seen today for follow up visit for his CAD Occasionally short of breath . Comes and goes .  BP is a little elevated today Has occasional CP  Able to do all that he wants to do   November 04, 2016: Still having some chest pain Not exertional ,  Seems very similar to his previous episodes of angina prior to stenting .   July 31 , 2108  Daniel Carey is seen today for follow up of a recent cath.  He was found to  have diffuse moderate - severe CAD - especially in the LAD  Was started on Imdur - is having a headache with that. Angina seems to be slightly  better.  He still has marked DOE - wife is very concerned about this .   is going to see pulmonary soon .  Sept. 24, 2018:  Daniel Carey is doing very well. His chest pain seemed to be better. Getting some exercise   A little bit more salt than usual over the weekend. His diastolic blood pressure today is 90.  Current Outpatient Prescriptions on File Prior to Visit  Medication Sig Dispense Refill  . albuterol (PROAIR HFA) 108 (90 Base) MCG/ACT inhaler NHALE 2 PUFFS INTO THE LUNGS EVERY 6 (SIX) HOURS AS NEEDED FOR WHEEZING OR SHORTNESS OF BREATH. 8.5 Inhaler 0  . aspirin EC 81 MG tablet Take 81 mg by mouth daily.    . Azelastine-Fluticasone (DYMISTA) 137-50 MCG/ACT SUSP Place 1-2 sprays into the nose daily. 23 g 11  . Choline Fenofibrate 135 MG capsule Take 1 capsule (135 mg total) by mouth daily. 90 capsule 3  . clopidogrel (PLAVIX) 75 MG tablet Take 1 tablet (75 mg total) by mouth daily. 90 tablet 2  . famotidine (PEPCID) 20 MG tablet Take 20 mg by mouth daily as needed for heartburn.    . hydrochlorothiazide (HYDRODIURIL) 25 MG tablet Take 0.5 tablets (12.5 mg total) by mouth daily. 45 tablet 3  . isosorbide mononitrate (IMDUR) 30 MG 24 hr tablet Take 1 tablet (30 mg total) by mouth daily. 30 tablet 11  . metoprolol tartrate (LOPRESSOR) 25 MG tablet Take 1 tablet (25 mg total) by mouth 2 (two) times daily. 180 tablet 3  . Na Sulfate-K Sulfate-Mg Sulf 17.5-3.13-1.6 GM/180ML SOLN Take as directed for colonoscopy. 354 mL 0  . naproxen sodium (ANAPROX) 220 MG tablet Take 220 mg by mouth daily as needed (headache).    . nitroGLYCERIN (NITROSTAT) 0.4 MG SL tablet Place 1 tablet (0.4 mg total) under the tongue every 5 (five) minutes as needed for chest pain. 25 tablet 4  . RABEprazole (ACIPHEX) 20 MG tablet Take 20 mg by mouth daily.    . rosuvastatin  (CRESTOR) 20 MG tablet Take 1 tablet (20 mg total) by mouth daily. 90 tablet 3   No current  facility-administered medications on file prior to visit.     Allergies  Allergen Reactions  . Lisinopril Cough and Rash  . Amlodipine Besylate Other (See Comments)    Headache, weakness, dizziness  . Propofol Other (See Comments)    Heart rate dropped  . Rosuvastatin Other (See Comments)     muscle aches    Past Medical History:  Diagnosis Date  . 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 CAD (coronary artery disease)    moderate to severe distal CAD with large epicardial arteries having only mild to moderate irregularities. Last cath in Dec 2006; s/p cath with Promus DES in distal RCA and Promus DES in the mid vessel in an overlapping fashion  . Chest pain   . COPD (chronic obstructive pulmonary disease) ([place])   . ED (erectile dysfunction)   . Esophageal stricture   . External hemorrhoids without mention of complication   . GERD (gastroesophageal reflux disease)   . Headache(784.0)   . Hiatal hernia   . HLD (hyperlipidemia)   . HTN (hypertension)   . Nephrolithiasis   . Personal history of colonic polyps 03/12/2010   TUBULAR ADENOMA  . Tobacco abuse     Past Surgical History:  Procedure Laterality Date  . ANGIOPLASTY     X3  . LEFT HEART CATH AND CORONARY ANGIOGRAPHY N/A 11/05/2016   Procedure: Left Heart Cath and Coronary Angiography;  Surgeon: Nelva Bush, MD;  Location: [facility];  Service: Cardiovascular;  Laterality: N/A;  . PERCUTANEOUS CORONARY ROTOBLATOR INTERVENTION (PCI-R) N/A 06/30/2012   Procedure: PERCUTANEOUS CORONARY ROTOBLATOR INTERVENTION (PCI-R);  Surgeon: Burnell Blanks, MD;  [facility] CATH LAB;  Service: Cardiovascular;  Laterality: N/A;  . VIDEO BRONCHOSCOPY  08/13/2011   Procedure: VIDEO BRONCHOSCOPY WITHOUT FLUORO;  Surgeon: Tanda Rockers, MD;  Location: [facility] ENDOSCOPY;  Service: Cardiopulmonary;  Laterality: Bilateral;     History  Smoking Status  . Former Smoker  . Packs/day: 0.50  . Years: 40.00  . Types: Cigarettes  . Quit date: 08/25/2011  Smokeless Tobacco  . Never Used    History  Alcohol Use  . 0.0 oz/week    Comment: rare    Family History  Problem Relation Age of Onset  . Heart disease Father        hx of MI  . Diabetes Mother        and brother   . Kidney failure Mother   . Breast cancer Sister   . Colon cancer Neg Hx   . Stomach cancer Neg Hx     Reviw of Systems:  Reviewed in the HPI.  All other systems are negative.  Physical Exam: Blood pressure 120/90, pulse 78, height 5\' 10"  (1.778 m), weight 191 lb 12.8 oz (87 kg), SpO2 96 %. General: Well developed, well nourished, in no acute distress. Head:   [place] / AT ,  Neck: Supple. Carotids are 2 + without bruits. No JVD Lungs: Clear bilaterally to auscultation. Heart:  RR,  No murmur s Abdomen: soft , NT, + BS  Msk:  Strength and tone are normal Extremities:  NO c/c/e  Neuro: Alert and oriented X 3. Moves all extremities spontaneously. Psych:  Responds to questions appropriately with a normal affect.  ECG:    Assessment / Plan:   1. Dyspnea:  He continues to have shortness of breath especially with exertion. The sensation seems to start down in his abdomen. This could be an angina  equivalent.  2. CAD he has moderate three-vessel coronary artery disease.  We'll have Dr. Saunders Revel review his cath from July and help [place] with the recommendation.  Ferlin Fairhurst need to go for atherectomy of his LAD or perhaps coronary artery bypass grafting.  3.  Obstructive sleep apnea:    Stable   4. Hyperlipidemia:   He's been on rosuvastatin 20 g a day. We'll check labs today.   Mertie Moores, MD  01/11/2017 8:35 AM    [place],  [place], [place]  [address] Pager [phone] Phone: [phone]; Fax: [phone]

## 2017-01-11 NOTE — Patient Instructions (Addendum)
Medication Instructions:  Your physician recommends that you continue on your current medications as directed. Please refer to the Current Medication list given to you today.   Labwork: TODAY - cholesterol, basic metabolic panel, liver panel   Testing/Procedures: None Ordered   Follow-Up: Your physician recommends that you schedule a follow-up appointment in: 3 months with Dr. Acie Fredrickson   If you need a refill on your cardiac medications before your next appointment, please call your pharmacy.   Thank you for choosing [facility]! Christen Bame, RN [phone]

## 2017-01-14 ENCOUNTER — Other Ambulatory Visit: Payer: Self-pay | Admitting: Internal Medicine

## 2017-01-14 DIAGNOSIS — R0600 Dyspnea, unspecified: Secondary | ICD-10-CM

## 2017-01-15 ENCOUNTER — Ambulatory Visit (INDEPENDENT_AMBULATORY_CARE_PROVIDER_SITE_OTHER): Payer: BLUE CROSS/BLUE SHIELD | Admitting: Internal Medicine

## 2017-01-15 ENCOUNTER — Encounter: Payer: Self-pay | Admitting: Internal Medicine

## 2017-01-15 VITALS — BP 110/60 | HR 64 | Ht 70.0 in | Wt 190.0 lb

## 2017-01-15 DIAGNOSIS — R05 Cough: Secondary | ICD-10-CM | POA: Diagnosis not present

## 2017-01-15 DIAGNOSIS — Z23 Encounter for immunization: Secondary | ICD-10-CM

## 2017-01-15 DIAGNOSIS — J449 Chronic obstructive pulmonary disease, unspecified: Secondary | ICD-10-CM | POA: Diagnosis not present

## 2017-01-15 DIAGNOSIS — R0600 Dyspnea, unspecified: Secondary | ICD-10-CM | POA: Diagnosis not present

## 2017-01-15 DIAGNOSIS — R058 Other specified cough: Secondary | ICD-10-CM

## 2017-01-15 LAB — PULMONARY FUNCTION TEST
DL/VA % pred: 69 %
DL/VA: 3.18 ml/min/mmHg/L
DLCO cor % pred: 49 %
DLCO cor: 15.85 ml/min/mmHg
DLCO unc % pred: 46 %
DLCO unc: 15.09 ml/min/mmHg
FEF 25-75 Post: 1 L/sec
FEF 25-75 Pre: 0.52 L/sec
FEF2575-%Change-Post: 91 %
FEF2575-%Pred-Post: 36 %
FEF2575-%Pred-Pre: 19 %
FEV1-%Change-Post: 22 %
FEV1-%Pred-Post: 46 %
FEV1-%Pred-Pre: 37 %
FEV1-Post: 1.6 L
FEV1-Pre: 1.3 L
FEV1FVC-%Change-Post: 0 %
FEV1FVC-%Pred-Pre: 74 %
FEV6-%Change-Post: 20 %
FEV6-%Pred-Post: 62 %
FEV6-%Pred-Pre: 51 %
FEV6-Post: 2.71 L
FEV6-Pre: 2.25 L
FEV6FVC-%Change-Post: -1 %
FEV6FVC-%Pred-Post: 100 %
FEV6FVC-%Pred-Pre: 101 %
FVC-%Change-Post: 21 %
FVC-%Pred-Post: 61 %
FVC-%Pred-Pre: 50 %
FVC-Post: 2.84 L
FVC-Pre: 2.34 L
Post FEV1/FVC ratio: 56 %
Post FEV6/FVC ratio: 95 %
Pre FEV1/FVC ratio: 56 %
Pre FEV6/FVC Ratio: 96 %
RV % pred: 183 %
RV: 4.31 L
TLC % pred: 100 %
TLC: 7.08 L

## 2017-01-15 MED ORDER — TIOTROPIUM BROMIDE MONOHYDRATE 2.5 MCG/ACT IN AERS: 1.0000 | INHALATION_SPRAY | Freq: Every day | RESPIRATORY_TRACT | 0 refills | Status: DC

## 2017-01-15 MED ORDER — TIOTROPIUM BROMIDE MONOHYDRATE 2.5 MCG/ACT IN AERS: INHALATION_SPRAY | RESPIRATORY_TRACT | 11 refills | Status: DC

## 2017-01-15 NOTE — Patient Instructions (Addendum)
Plan A = Automatic = Spiriva 2 pffs each am (high octane fuel)  And take aciphex Take 30-60 min before first meal of the day   Work on inhaler technique:  relax and gently blow all the way out then take a nice smooth deep breath back in, triggering the inhaler at same time you start breathing in.  Hold for up to 5 seconds if you can. Blow out thru nose. Rinse and gargle with water when done   Plan B = Backup Only use your albuterol as a rescue medication to be used if you can't catch your breath by resting or doing a relaxed purse lip breathing pattern.  - The less you use it, the better it will work when you need it. - Ok to use the inhaler up to 2 puffs  every 4 hours if you must but call for appointment if use goes up over your usual need - Don't leave home without it !!  (think of it like the spare tire for your car)    Please schedule a follow up visit in 3 months but call sooner if needed

## 2017-01-15 NOTE — Assessment & Plan Note (Signed)
Trial of max rx for GERD/ add dymista trial 11/30/2016 > no better 01/15/2017 so d/c and rec again he change the aciphex to Take 30-60 min before first meal of the day plus prn 1st gen H1 blockers per guidelines

## 2017-01-15 NOTE — Progress Notes (Signed)
Subjective:    Patient ID: Daniel Carey, male    DOB: 09-18-1950,   MRN: [id number]    Brief patient profile:  69  yowm  Quit smoking 2013  Frequent Conservation officer, nature  including international with pattern of increased frequency and severity typically in winter x 2008 and since 2010 noted some streaky blood quantity more pronounced especially since abrupt onset mid March 15th 2013 referred 07/28/2011 to pulmonary clinic.    History of Present Illness  07/28/2011 1st pulmonary ov cc persistent hemoptysis x 3 weeks better transiently  p avelox and nasal drainage esp lie down occ with epistaxis and chest discomfort from coughing.  Mucus is mostly white, blood is less a tbsp per day now. No sob though not aerobically active rec Augmentin 875 twice daily bfast and supper, yogurt for lunch - take for 10 days then call for sinus ct scan if not better Take mucinex dm 1200mg   every 12 hours and supplement if needed with  tramadol 50 mg up to 1-2   every 4 hours to suppress the urge to cough. Swallowing water or using ice chips/non mint and menthol containing candies (such as lifesavers or sugarless jolly ranchers) are also effective.  You should rest your voice and avoid activities that you know make you cough. Once you have eliminated the cough for 3 straight days try reducing the tramadol first,  then the delsym as tolerated.   Try acidphex 20mg   Take 30-60 min before first meal of the day and Pepcid 20 mg one bedtime until cough is completely gone for at least a week without the need for cough suppression Prednisone 10 mg take  4 each am x 2 days,   2 each am x 2 days,  1 each am x2days and stop     08/13/11  FOB neg findings/ neg cytology    11/30/2016   ov/Daniel Carey re: seen as "new pt " with new concern = doe  Chief Complaint  Patient presents with  . Advice Only    last seen 07/2011- c/o worsening sob, wheezing.   indolent doe gradually worse x 5 years but still MMRC1 = can  walk nl pace, flat grade, can't hurry or go uphills or steps s sob  (fixed/ proportionate pattern of doe or wheeze with exertion  Sleeps on  cpap most nights maybe once a week wakes up feeling he is breathing fast, can't get a deep breath x 10 minutes resolves spontaneously   Also watery rhinitis x one year no better with zyrtec / "only when working"  Never hs or with meals/ wife hears wheezing/ pt not aware  rec Try taking aciphex Take 30-60 min before last meal of the day >  Add pepcid 20 mg at bedtime to see if some of your night time symptoms improve or not GERD diet  Try dymista one twice daily to see if helps the runny nose issues and if not no need to fill the prescription    01/15/2017  f/u ov/Daniel Carey re:  Copd  GOLD III with reversible component / did not take aciphex as rec, no better pnds on dymista Once or twice a week gets sob 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 No unusual exp hx or h/o childhood pna/ asthma or knowledge of premature birth.  Sleeping ok flat without nocturnal  or early am exacerbation  of respiratory  c/o's or need for noct saba. Also denies any obvious fluctuation of symptoms with weather or environmental changes or other aggravating or alleviating factors except as outlined above   Current Allergies, Complete Past Medical History, Past Surgical History, Family History, and Social History were reviewed in [employer] record.  ROS  The following are not active complaints unless bolded sore throat, dysphagia, dental problems, itching,  sneezing,  nasal congestion or disharge of excess mucus or purulent secretions, ear ache,   fever, chills, sweats, unintended wt loss or wt gain, classically pleuritic or exertional cp,  orthopnea pnd or leg swelling, presyncope, palpitations, abdominal pain, anorexia, nausea, vomiting, diarrhea  or change in bowel habits or bladder habits, change in stools or change in urine, dysuria, hematuria,  rash, arthralgias, visual complaints, headache, numbness, weakness or ataxia or problems with walking or coordination,  change in mood/affect or memory.        Current Meds  Medication Sig  . albuterol (PROAIR HFA) 108 (90 Base) MCG/ACT inhaler NHALE 2 PUFFS INTO THE LUNGS EVERY 6 (SIX) HOURS AS NEEDED FOR WHEEZING OR SHORTNESS OF BREATH.  Marland Kitchen aspirin EC 81 MG tablet Take 81 mg by mouth daily.  . Azelastine-Fluticasone (DYMISTA) 137-50 MCG/ACT SUSP Place 1-2 sprays into the nose daily.  . Choline Fenofibrate 135 MG capsule Take 1 capsule (135 mg total) by mouth daily.  . clopidogrel (PLAVIX) 75 MG tablet Take 1 tablet (75 mg total) by mouth daily.  . famotidine (PEPCID) 20 MG tablet Take 20 mg by mouth daily as needed for heartburn.  . hydrochlorothiazide (HYDRODIURIL) 25 MG tablet Take 0.5 tablets (12.5 mg total) by mouth daily.  . isosorbide mononitrate (IMDUR) 30 MG 24 hr tablet Take 1 tablet (30 mg total) by mouth daily.  . metoprolol tartrate (LOPRESSOR) 25 MG tablet Take 1 tablet (25 mg total) by mouth 2 (two) times daily.  . Na Sulfate-K Sulfate-Mg Sulf 17.5-3.13-1.6 GM/180ML SOLN Take as directed for colonoscopy.  . naproxen sodium (ANAPROX) 220 MG tablet Take 220 mg by mouth daily as needed (headache).  . nitroGLYCERIN (NITROSTAT) 0.4 MG SL tablet Place 1 tablet (0.4 mg total) under the tongue every 5 (five) minutes as needed for chest pain.  . RABEprazole (ACIPHEX) 20 MG tablet Take 20 mg by mouth daily.  . rosuvastatin (CRESTOR) 20 MG tablet Take 1 tablet (20 mg total) by mouth daily.                            Objective:   Physical Exam  amb pleasant wm nad     01/15/2017       190   11/30/16 191 lb (86.6 kg)  11/17/16 190 lb 9.6 oz (86.5 kg)  11/05/16 188 lb (85.3 kg)    Vital signs reviewed  -  - Note on arrival 02 sats  100% on RA      HEENT: nl dentition, turbinates bilaterally, and oropharynx. Nl external ear canals without cough reflex   NECK :  without JVD/Nodes/TM/ nl carotid upstrokes bilaterally   LUNGS: no acc muscle use,  Nl contour  chest with slightly distant bs w/o wheeze p saba from pft lab    CV:  RRR  no s3 or murmur or increase in P2, and no edema   ABD:  soft and nontender with nl inspiratory excursion in the supine position. No bruits or organomegaly appreciated, bowel sounds nl  MS:  Nl gait/ ext warm without deformities, calf tenderness, cyanosis or clubbing No obvious joint restrictions   SKIN: warm and dry without lesions    NEURO:  alert, approp, nl sensorium with  no motor or cerebellar deficits apparent.    CXR PA and Lateral:   11/30/2016 :    I personally reviewed images and agree with radiology impression as follows:    The heart size and mediastinal contours are within normal limits. Both lungs are clear. The visualized skeletal structures are unremarkable.       Assessment & Plan:

## 2017-01-15 NOTE — Progress Notes (Signed)
PFT done today. 

## 2017-01-15 NOTE — Assessment & Plan Note (Signed)
Quit smoking 2013  Spirometry 11/30/2016  FEV1 1.40 (41%)  Ratio 57 off all rx with classic f/v curvature  - PFT's  01/15/2017  FEV1 1.60  (46 % ) ratio 56  p 22 % improvement from saba p nothing prior to study with DLCO  46/49  % corrects to 69  % for alv volume  - 01/15/2017  After extensive coaching device  effectiveness =  90%    > try spiriva smi 2 pffs each am   So he does have enough copd to explain doe x mmrc=1 severity and no tendency to aecopd at this point s/p remote smoking cessation with best choice lama or lama/laba but with IHD concern favor the former on trial basis then return here in 3 m, sooner prn  I had an extended discussion with the patient and wife reviewing all relevant studies completed to date and  lasting 15 to 20 minutes of a 25 minute visit    Each maintenance medication was reviewed in detail including most importantly the difference between maintenance and prns and under what circumstances the prns are to be triggered using an action plan format that is not reflected in the computer generated alphabetically organized AVS.    Please see AVS for specific instructions unique to this visit that I personally wrote and verbalized to the the pt in detail and then reviewed with pt  by my nurse highlighting any  changes in therapy recommended at today's visit to their plan of care.

## 2017-02-08 ENCOUNTER — Ambulatory Visit: Payer: BLUE CROSS/BLUE SHIELD | Admitting: Cardiovascular Disease

## 2017-02-16 ENCOUNTER — Encounter (HOSPITAL_COMMUNITY): Payer: Self-pay | Admitting: *Deleted

## 2017-02-16 ENCOUNTER — Ambulatory Visit (HOSPITAL_COMMUNITY)
Admission: RE | Admit: 2017-02-16 | Discharge: 2017-02-16 | Disposition: A | Discharge status: Home / Self Care | Payer: BLUE CROSS/BLUE SHIELD | Source: Ambulatory Visit | Attending: Internal Medicine | Admitting: Internal Medicine

## 2017-02-16 ENCOUNTER — Encounter (HOSPITAL_COMMUNITY)
Admission: RE | Disposition: A | Discharge status: Home / Self Care | Payer: Self-pay | Source: Ambulatory Visit | Attending: Internal Medicine

## 2017-02-16 DIAGNOSIS — G4733 Obstructive sleep apnea (adult) (pediatric): Secondary | ICD-10-CM | POA: Diagnosis not present

## 2017-02-16 DIAGNOSIS — Z888 Allergy status to other drugs, medicaments and biological substances status: Secondary | ICD-10-CM | POA: Diagnosis not present

## 2017-02-16 DIAGNOSIS — Z1211 Encounter for screening for malignant neoplasm of colon: Secondary | ICD-10-CM | POA: Diagnosis not present

## 2017-02-16 DIAGNOSIS — Z791 Long term (current) use of non-steroidal anti-inflammatories (NSAID): Secondary | ICD-10-CM | POA: Insufficient documentation

## 2017-02-16 DIAGNOSIS — Z79899 Other long term (current) drug therapy: Secondary | ICD-10-CM | POA: Diagnosis not present

## 2017-02-16 DIAGNOSIS — Z87891 Personal history of nicotine dependence: Secondary | ICD-10-CM | POA: Insufficient documentation

## 2017-02-16 DIAGNOSIS — D120 Benign neoplasm of cecum: Secondary | ICD-10-CM | POA: Diagnosis not present

## 2017-02-16 DIAGNOSIS — Z7902 Long term (current) use of antithrombotics/antiplatelets: Secondary | ICD-10-CM | POA: Insufficient documentation

## 2017-02-16 DIAGNOSIS — Z86010 Personal history of colonic polyps: Secondary | ICD-10-CM | POA: Insufficient documentation

## 2017-02-16 DIAGNOSIS — K449 Diaphragmatic hernia without obstruction or gangrene: Secondary | ICD-10-CM | POA: Diagnosis not present

## 2017-02-16 DIAGNOSIS — Z841 Family history of disorders of kidney and ureter: Secondary | ICD-10-CM | POA: Diagnosis not present

## 2017-02-16 DIAGNOSIS — Z7982 Long term (current) use of aspirin: Secondary | ICD-10-CM | POA: Diagnosis not present

## 2017-02-16 DIAGNOSIS — K219 Gastro-esophageal reflux disease without esophagitis: Secondary | ICD-10-CM | POA: Diagnosis not present

## 2017-02-16 DIAGNOSIS — I10 Essential (primary) hypertension: Secondary | ICD-10-CM | POA: Diagnosis not present

## 2017-02-16 DIAGNOSIS — Z7951 Long term (current) use of inhaled steroids: Secondary | ICD-10-CM | POA: Insufficient documentation

## 2017-02-16 DIAGNOSIS — D125 Benign neoplasm of sigmoid colon: Secondary | ICD-10-CM | POA: Diagnosis not present

## 2017-02-16 DIAGNOSIS — I2510 Atherosclerotic heart disease of native coronary artery without angina pectoris: Secondary | ICD-10-CM | POA: Insufficient documentation

## 2017-02-16 DIAGNOSIS — Z8249 Family history of ischemic heart disease and other diseases of the circulatory system: Secondary | ICD-10-CM | POA: Insufficient documentation

## 2017-02-16 DIAGNOSIS — Z833 Family history of diabetes mellitus: Secondary | ICD-10-CM | POA: Insufficient documentation

## 2017-02-16 DIAGNOSIS — Z9862 Peripheral vascular angioplasty status: Secondary | ICD-10-CM | POA: Diagnosis not present

## 2017-02-16 DIAGNOSIS — D122 Benign neoplasm of ascending colon: Secondary | ICD-10-CM

## 2017-02-16 DIAGNOSIS — D123 Benign neoplasm of transverse colon: Secondary | ICD-10-CM | POA: Diagnosis not present

## 2017-02-16 DIAGNOSIS — J449 Chronic obstructive pulmonary disease, unspecified: Secondary | ICD-10-CM | POA: Insufficient documentation

## 2017-02-16 DIAGNOSIS — E785 Hyperlipidemia, unspecified: Secondary | ICD-10-CM | POA: Diagnosis not present

## 2017-02-16 DIAGNOSIS — Z803 Family history of malignant neoplasm of breast: Secondary | ICD-10-CM | POA: Insufficient documentation

## 2017-02-16 HISTORY — DX: Other complications of anesthesia, initial encounter: T88.59XA

## 2017-02-16 HISTORY — PX: COLONOSCOPY: SHX5424

## 2017-02-16 HISTORY — DX: Adverse effect of unspecified anesthetic, initial encounter: T41.45XA

## 2017-02-16 SURGERY — COLONOSCOPY
Anesthesia: Moderate Sedation

## 2017-02-16 MED ORDER — FENTANYL CITRATE (PF) 100 MCG/2ML IJ SOLN
INTRAMUSCULAR | Status: AC
  Filled 2017-02-16: qty 2

## 2017-02-16 MED ORDER — SODIUM CHLORIDE 0.9 % IV SOLN
INTRAVENOUS | Status: DC
  Administered 2017-02-16: 07:00:00 via INTRAVENOUS

## 2017-02-16 MED ORDER — MIDAZOLAM HCL 5 MG/5ML IJ SOLN
INTRAMUSCULAR | Status: DC | PRN
  Administered 2017-02-16: 1 mg via INTRAVENOUS
  Administered 2017-02-16 (×2): 2 mg via INTRAVENOUS

## 2017-02-16 MED ORDER — MIDAZOLAM HCL 5 MG/ML IJ SOLN
INTRAMUSCULAR | Status: AC
  Filled 2017-02-16: qty 2

## 2017-02-16 MED ORDER — FENTANYL CITRATE (PF) 100 MCG/2ML IJ SOLN
INTRAMUSCULAR | Status: DC | PRN
  Administered 2017-02-16 (×2): 25 ug via INTRAVENOUS

## 2017-02-16 NOTE — H&P (Signed)
HPI: Daniel Carey is a 66 year old male with a past medical history of adenomatous colon polyps, CAD status post PCI on aspirin and Plavix, obstructive sleep apnea, GERD, hypertension and COPD who presents for outpatient surveillance colonoscopy.  He was seen in the office by Nicoletta Ba, PA-C on 12/08/2016. No new GI symptoms since that time. His last colonoscopy was March 2013 when he had 2 small adenomatous polyps removed. Five-year recall was recommended  Past Medical History:  Diagnosis Date  . 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 CAD (coronary artery disease)    moderate to severe distal CAD with large epicardial arteries having only mild to moderate irregularities. Last cath in Dec 2006; s/p cath with Promus DES in distal RCA and Promus DES in the mid vessel in an overlapping fashion  . Chest pain   . Complication of anesthesia    heart rate dropping with propofol last procedure  . COPD (chronic obstructive pulmonary disease) ([place])   . ED (erectile dysfunction)   . Esophageal stricture   . External hemorrhoids without mention of complication   . GERD (gastroesophageal reflux disease)   . Headache(784.0)   . Hiatal hernia   . HLD (hyperlipidemia)   . HTN (hypertension)   . Nephrolithiasis   . Personal history of colonic polyps 03/12/2010   TUBULAR ADENOMA  . Tobacco abuse     Past Surgical History:  Procedure Laterality Date  . ANGIOPLASTY     X3  . LEFT HEART CATH AND CORONARY ANGIOGRAPHY N/A 11/05/2016   Procedure: Left Heart Cath and Coronary Angiography;  Surgeon: Nelva Bush, MD;  Location: [facility];  Service: Cardiovascular;  Laterality: N/A;  . PERCUTANEOUS CORONARY ROTOBLATOR INTERVENTION (PCI-R) N/A 06/30/2012   Procedure: PERCUTANEOUS CORONARY ROTOBLATOR INTERVENTION (PCI-R);  Surgeon: Burnell Blanks, MD;  Location: [facility] CATH LAB;  Service: Cardiovascular;  Laterality: N/A;  . VIDEO BRONCHOSCOPY  08/13/2011   Procedure: VIDEO  BRONCHOSCOPY WITHOUT FLUORO;  Surgeon: Tanda Rockers, MD;  Location: [facility] ENDOSCOPY;  Service: Cardiopulmonary;  Laterality: Bilateral;     (Not in an outpatient encounter)  Allergies  Allergen Reactions  . Lisinopril Cough and Rash  . Amlodipine Besylate Other (See Comments)    Headache, weakness, dizziness  . Propofol Other (See Comments)    Heart rate dropped    Family History  Problem Relation Age of Onset  . Heart disease Father        hx of MI  . Diabetes Mother        and brother   . Kidney failure Mother   . Breast cancer Sister   . Colon cancer Neg Hx   . Stomach cancer Neg Hx     Social History  Substance Use Topics  . Smoking status: Former Smoker    Packs/day: 0.50    Years: 40.00    Types: Cigarettes    Quit date: 08/25/2011  . Smokeless tobacco: Never Used  . Alcohol use 0.0 oz/week     Comment: rare    ROS: As per history of present illness, otherwise negative  BP (!) 154/74   Pulse (!) 57   Temp 97.7 F (36.5 C) (Oral)   Resp 14   Ht 5\' 10"  (1.778 m)   Wt 190 lb (86.2 kg)   SpO2 94%   BMI 27.26 kg/m  Gen: awake, alert, NAD HEENT: anicteric, op clear CV: RRR, no mrg Pulm: CTA b/l Abd: soft, NT/ND, +BS throughout  Ext: no c/c/e Neuro: nonfocal   RELEVANT LABS AND IMAGING: CBC    Component Value Date/Time   WBC 6.1 11/04/2016 1206   WBC 7.5 07/01/2012 0512   RBC 5.06 11/04/2016 1206   RBC 3.98 (L) 07/01/2012 0512   HGB 15.4 11/04/2016 1206   HCT 45.3 11/04/2016 1206   PLT 304 11/04/2016 1206   MCV 90 11/04/2016 1206   MCH 30.4 11/04/2016 1206   MCH 30.7 07/01/2012 0512   MCHC 34.0 11/04/2016 1206   MCHC 35.3 07/01/2012 0512   RDW 14.2 11/04/2016 1206   LYMPHSABS 3.0 07/14/2011 1000   MONOABS 0.7 07/14/2011 1000   EOSABS 0.4 07/14/2011 1000   BASOSABS 0.0 07/14/2011 1000    CMP     Component Value Date/Time   NA 139 01/11/2017 0856   K 5.2 01/11/2017 0856   CL 99 01/11/2017 0856   CO2 25 01/11/2017 0856   GLUCOSE 135  (H) 01/11/2017 0856   GLUCOSE 140 (H) 11/05/2016 0902   BUN 16 01/11/2017 0856   CREATININE 0.94 01/11/2017 0856   CREATININE 1.06 01/09/2016 1127   CALCIUM 9.9 01/11/2017 0856   PROT 7.1 01/11/2017 0856   ALBUMIN 4.5 01/11/2017 0856   AST 18 01/11/2017 0856   ALT 20 01/11/2017 0856   ALKPHOS 68 01/11/2017 0856   BILITOT 0.5 01/11/2017 0856   GFRNONAA 85 01/11/2017 0856   GFRAA 98 01/11/2017 0856    ASSESSMENT/PLAN: 66 year old male with a past medical history of adenomatous colon polyps, CAD status post PCI on aspirin and Plavix, obstructive sleep apnea, GERD, hypertension and COPD who presents for outpatient surveillance colonoscopy  1. History of colon polyps -- for surveillance colonoscopy today with moderate sedation. The nature of the procedure, as well as the risks, benefits, and alternatives were carefully and thoroughly reviewed with the patient. Ample time for discussion and questions allowed. The patient understood, was satisfied, and agreed to proceed.

## 2017-02-16 NOTE — Op Note (Signed)
[facility] Patient Name: Daniel Carey Procedure Date: 02/16/2017 MRN: [id number] Attending MD: Jerene Bears , MD Date of Birth: [birth year] CSN: [id number] Age: 67 Admit Type: Outpatient Procedure:                Colonoscopy Indications:              Surveillance: Personal history of adenomatous                            polyps on last colonoscopy > 5 years ago Providers:                Lajuan Lines. Hilarie Fredrickson, MD, Burtis Junes, RN, Cletis Athens,                            Technician Referring MD:             Dione Housekeeper Medicines:                Fentanyl 50 micrograms IV, Midazolam 5 mg IV Complications:            No immediate complications. Estimated Blood Loss:     Estimated blood loss was minimal. Procedure:                Pre-Anesthesia Assessment:                           - Prior to the procedure, a History and Physical                            was performed, and patient medications and                            allergies were reviewed. The patient's tolerance of                            previous anesthesia was also reviewed. The risks                            and benefits of the procedure and the sedation                            options and risks were discussed with the patient.                            All questions were answered, and informed consent                            was obtained. Prior Anticoagulants: The patient has                            taken Plavix (clopidogrel), last dose was 5 days                            prior to procedure. ASA Grade Assessment: III - A  patient with severe systemic disease. After                            reviewing the risks and benefits, the patient was                            deemed in satisfactory condition to undergo the                            procedure.                           After obtaining informed consent, the colonoscope                            was passed under  direct vision. Throughout the                            procedure, the patient's blood pressure, pulse, and                            oxygen saturations were monitored continuously. The                            EC-[id number] ([id number]) scope was introduced through                            the anus and advanced to the the cecum, identified                            by appendiceal orifice and ileocecal valve. The                            colonoscopy was performed without difficulty. The                            patient tolerated the procedure well. The quality                            of the bowel preparation was excellent. The                            ileocecal valve, appendiceal orifice, and rectum                            were photographed. The bowel preparation used was                            SUPREP. Scope In: 8:52:04 AM Scope Out: 9:10:30 AM Scope Withdrawal Time: 0 hours 16 minutes 10 seconds  Total Procedure Duration: 0 hours 18 minutes 26 seconds  Findings:      The digital rectal exam was normal.      A 3 mm polyp was found in the cecum. The polyp was sessile. The polyp  was removed with a cold biopsy forceps. Resection and retrieval were       complete.      A 6 mm polyp was found in the ascending colon. The polyp was sessile.       The polyp was removed with a cold snare. Resection and retrieval were       complete.      Two sessile polyps were found in the ascending colon. The polyps were 2       to 3 mm in size. These polyps were removed with a cold biopsy forceps.       Resection and retrieval were complete.      Two sessile polyps were found in the transverse colon. The polyps were 4       to 5 mm in size. These polyps were removed with a cold snare. Resection       and retrieval were complete.      A 5 mm polyp was found in the sigmoid colon. The polyp was sessile. The       polyp was removed with a cold snare. Resection and retrieval were        complete.      The exam was otherwise without abnormality on direct and retroflexion       views. Impression:               - One 3 mm polyp in the cecum, removed with a cold                            biopsy forceps. Resected and retrieved.                           - One 6 mm polyp in the ascending colon, removed                            with a cold snare. Resected and retrieved.                           - Two 2 to 3 mm polyps in the ascending colon,                            removed with a cold biopsy forceps. Resected and                            retrieved.                           - Two 4 to 5 mm polyps in the transverse colon,                            removed with a cold snare. Resected and retrieved.                           - One 5 mm polyp in the sigmoid colon, removed with                            a cold snare. Resected and retrieved.                           -  The examination was otherwise normal on direct                            and retroflexion views. Moderate Sedation:      Moderate (conscious) sedation was administered by the endoscopy nurse       and supervised by the endoscopist. The following parameters were       monitored: oxygen saturation, heart rate, blood pressure, and response       to care. Total physician intraservice time was 32 minutes. Recommendation:           - Patient has a contact number available for                            emergencies. The signs and symptoms of potential                            delayed complications were discussed with the                            patient. Return to normal activities tomorrow.                            Written discharge instructions were provided to the                            patient.                           - Resume previous diet.                           - Continue present medications.                           - Resume Plavix (clopidogrel) at prior dose                             tomorrow. Refer to managing physician for further                            adjustment of therapy.                           - Await pathology results.                           - Repeat colonoscopy is recommended for                            surveillance. The colonoscopy date will be                            determined after pathology results from today's                            exam become available for review. Procedure Code(s):        ---  Professional ---                           [phone], Colonoscopy, flexible; with removal of                            tumor(s), polyp(s), or other lesion(s) by snare                            technique                           45380, 59, Colonoscopy, flexible; with biopsy,                            single or multiple                           99152, Moderate sedation services provided by the                            same physician or other qualified health care                            professional performing the diagnostic or                            therapeutic service that the sedation supports,                            requiring the presence of an independent trained                            observer to assist in the monitoring of the                            patient's level of consciousness and physiological                            status; initial 15 minutes of intraservice time,                            patient age 29 years or older                           [phone], Moderate sedation services; each additional                            15 minutes intraservice time Diagnosis Code(s):        --- Professional ---                           Z86.010, Personal history of colonic polyps                           D12.0, Benign neoplasm of cecum  D12.2, Benign neoplasm of ascending colon                           D12.5, Benign neoplasm of sigmoid colon                           D12.3, Benign neoplasm of  transverse colon (hepatic                            flexure or splenic flexure) CPT copyright 2016 [employer]. All rights reserved. The codes documented in this report are preliminary and upon coder review may  be revised to meet current compliance requirements. Jerene Bears, MD 02/16/2017 9:19:55 AM This report has been signed electronically. Number of Addenda: 0

## 2017-02-16 NOTE — Discharge Instructions (Addendum)
YOU HAD AN ENDOSCOPIC PROCEDURE TODAY: Refer to the procedure report that was given to you for any specific questions about what was found during the examination.  If the procedure report does not answer your questions, please call your gastroenterologist to clarify. ° °YOU SHOULD EXPECT: Some feelings of bloating in the abdomen. Passage of more gas than usual.  Walking can help get rid of the air that was put into your GI tract during the procedure and reduce the bloating. If you had a lower endoscopy (such as a colonoscopy or flexible sigmoidoscopy) you may notice spotting of blood in your stool or on the toilet paper.  ° °DIET: Your first meal following the procedure should be a light meal and then it is ok to progress to your normal diet.  A half-sandwich or bowl of soup is an example of a good first meal.  Heavy or fried foods are harder to digest and may make you feel nasueas or bloated.  Drink plenty of fluids but you should avoid alcoholic beverages for 24 hours. ° °ACTIVITY: Your care partner should take you home directly after the procedure.  You should plan to take it easy, moving slowly for the rest of the day.  You can resume normal activity the day after the procedure however you should NOT DRIVE or use heavy machinery for 24 hours (because of the sedation medicines used during the test).   ° °SYMPTOMS TO REPORT IMMEDIATELY  °A gastroenterologist can be reached at any hour.  Please call your doctor's office for any of the following symptoms: ° °· Following lower endoscopy (colonoscopy, flexible sigmoidoscopy) ° Excessive amounts of blood in the stool ° Significant tenderness, worsening of abdominal pains ° Swelling of the abdomen that is new, acute ° Fever of 100° or higher ° ° °FOLLOW UP: °If any biopsies were taken you will be contacted by phone or by letter within the next 1-3 weeks.  Call your gastroenterologist if you have not heard about the biopsies in 3 weeks.  °Please also call your  gastroenterologist's office with any specific questions about appointments or follow up tests. ° °

## 2017-02-17 ENCOUNTER — Encounter (HOSPITAL_COMMUNITY): Payer: Self-pay | Admitting: Internal Medicine

## 2017-02-18 ENCOUNTER — Encounter: Payer: Self-pay | Admitting: Internal Medicine

## 2017-02-19 ENCOUNTER — Other Ambulatory Visit: Payer: BLUE CROSS/BLUE SHIELD

## 2017-02-23 ENCOUNTER — Telehealth: Payer: Self-pay | Admitting: Cardiovascular Disease

## 2017-02-23 NOTE — Telephone Encounter (Signed)
Spoke with patient who states he is having pain with urination that lingers for several minutes after emptying his bladder. He states his urine is dark in color. He has a hx of kidney stones but the pain does not feel like a stone. He states he is also having SOB associated with the urinary pain. He denies chest pain. States he is currently on a job in [place] and became very winded after walking up 4-5 flights of steps. He states he had a colonoscopy 1 week ago and had a different type of prep this time; wonders if the prep is causing his urinary symptoms. He denies blood in urine. He states Drs. End and Nahser discussed revascularization options after his last cath and he thinks his symptoms are worsening. He requests an appointment Thursday if possible. I scheduled him to see Dr. Acie Fredrickson at 10:40 am on Thurs. 11/8. He thanked me for the call.

## 2017-02-23 NOTE — Telephone Encounter (Signed)
New message     Pt c/o Shortness Of Breath: STAT if SOB developed within the last 24 hours or pt is noticeably SOB on the phone  1. Are you currently SOB (can you hear that pt is SOB on the phone)? No   2. How long have you been experiencing SOB? Since he had colonoscopy on 02/16/17   3. Are you SOB when sitting or when up moving around? As soon as he stands up he gets winded, also has a severe kidney infection   4. Are you currently experiencing any other symptoms? Burning and pain when urinating

## 2017-02-23 NOTE — Telephone Encounter (Signed)
Any  Further details of his dyspnea. Is BP OK ? Does he think that these are due to his CAD We may need to have him seen in the office to set up PCI / atherectomy

## 2017-02-25 ENCOUNTER — Encounter: Payer: Self-pay | Admitting: Cardiovascular Disease

## 2017-02-25 ENCOUNTER — Ambulatory Visit (INDEPENDENT_AMBULATORY_CARE_PROVIDER_SITE_OTHER): Payer: BLUE CROSS/BLUE SHIELD | Admitting: Cardiovascular Disease

## 2017-02-25 VITALS — BP 158/90 | HR 78 | Ht 70.0 in | Wt 197.8 lb

## 2017-02-25 DIAGNOSIS — R509 Fever, unspecified: Secondary | ICD-10-CM | POA: Diagnosis not present

## 2017-02-25 DIAGNOSIS — R350 Frequency of micturition: Secondary | ICD-10-CM | POA: Diagnosis not present

## 2017-02-25 DIAGNOSIS — R0602 Shortness of breath: Secondary | ICD-10-CM | POA: Diagnosis not present

## 2017-02-25 DIAGNOSIS — R300 Dysuria: Secondary | ICD-10-CM | POA: Diagnosis not present

## 2017-02-25 DIAGNOSIS — I2510 Atherosclerotic heart disease of native coronary artery without angina pectoris: Secondary | ICD-10-CM

## 2017-02-25 DIAGNOSIS — I10 Essential (primary) hypertension: Secondary | ICD-10-CM | POA: Diagnosis not present

## 2017-02-25 NOTE — Progress Notes (Signed)
[name] Date of Birth  1951-03-27       [facility] [address], [place], [place]  [address]   [place], [place]  [address] [phone]     [phone]   Fax  [phone]    Fax [phone]  Problem List: 1. CAD - 2.5 x 38 mm Promus Premier DES in the distal RCA and a 2.75 x 38 mm Promus Premier DES in the mid vessel in an overlapping fashion. The distal stent was post-dilated with a 2.5 x 20 mm [place] balloon. The proximal portion of the stent was post-dilated with a 3.0 x 20 mm [place] balloon ( June 30, 2012)  2. Bradycardia after getting propafol for endoscopy. 3. Daniel Carey:     History of Present Illness:  [name] is feeling well today.  He's been getting some exercise. He does yard work and walks on the treadmill occasionally. He had an episode of bradycardia after getting IV protocol for an endoscopy procedure. He was seen by [name]. His metoprolol dose was decreased. He has also had a bronchoscopy with [name]. He does not have any evidence of COPD.  Both he and his wife have stop smoking.  ( Aug 20, 2011)  Feb. 28, 2014:  He continues to have increasing chest pain.  He has not been smoking.  The pains are associated with exertion, last 5-10 minutes, resolve with 1 SL NTG.   He's had a mildly elevated TSH for the past year or so and has had some fatigue.    his TSH level 6.8.      His medical doctor started him on Synthroid. He gradually has titrated up to 0.1 mg a day. Since that time he's had increasing episodes of chest pain.  Aug 29, 2012:  [name] saw him in February we performed a cardiac catheterization because of increasing chest pain. He was found to have sequential stenosis in his mid right coronary artery and had PCI of his right coronary artery ( 2.5 x 38 mm Promus Premier DES in the distal RCA and a 2.75 x 38 mm Promus Premier DES in the mid vessel in an overlapping fashion. The  distal sten was post-dilated with a 2.5 x 20 mm [place] balloon. The proximal portion of the stent was post-dilated with a 3.0 x 20 mm  balloon)  He and his wife are concerned about whether or not the Plavix is active.  His generic plavix manufacturer has changed.   He would like to be tested.   October 14, 2012:  [name] has been having some occasional CP.  He has had some episodes of dyspnea while climbing up a hill playing golf.  This past Monday night, he had some chest pain - possible due to indigestions.  these  He had lost his NTG - he has refilled it now.    Nov. 10, 2014:  [name] is doing well.  He is working too much according to wife. he's had a few CP but not severe enough to take NTG.  Still has some dyspnea with climbing steps.   He had stenting in march 2014.  October 23, 2013:  [name] is doing well.   Playing golf.  Working in the yard.  Not exercising as much.   He has lost about 6 lbs.  He had labs drawn today.     Jan. 7, 2016:  He has been  waking up in the middle of the night and feels like he cant catch his  Breath. Still working out - on the treadmill twice this week and has done well.   Sept. 1, 2016:  Has had some short of breath. Does not get any exercise.  His CP is much better since starting CPAP .  Is having bloating  - thinks its due to the lipitor   July 19, 2015: Has had some shortness of breath.   With exertion . Happens occasionally but seem to be getting more frequently .  BP is elevated today  Has cut back on his salt .   Sept. 21, 2017:  Issaih is seen today for follow up visit for his CAD Occasionally short of breath . Comes and goes .  BP is a little elevated today Has occasional CP  Able to do all that he wants to do   November 04, 2016: Still having some chest pain Not exertional ,  Seems very similar to his previous episodes of angina prior to stenting .   July 31 , 2108  Bretton is seen today for follow up of a recent cath.  He was found to  have diffuse moderate - severe CAD - especially in the LAD  Was started on Imdur - is having a headache with that. Angina seems to be slightly  better.  He still has marked DOE - wife is very concerned about this .   is going to see pulmonary soon .  Sept. 24, 2018:  [name] is doing very well. His chest pain seemed to be better. Getting some exercise   A little bit more salt than usual over the weekend. His diastolic blood pressure today is 90.  Nov. 8, 2018:    Owens is seen today  Has worsening dyspnea.   Chest tightness Also had a bladder infection  - burning with urination. ,   Fevers, chills.   Temp of 99.8 Still having fevers and chills  His dyspnea seems to be worse with his chills.  No blood in urine,   extremy dysuria and frequency + cough , no hemoptysis, No pleuretic CP  No real angina     Current Outpatient Medications on File Prior to Visit  Medication Sig Dispense Refill  . albuterol (PROAIR HFA) 108 (90 Base) MCG/ACT inhaler NHALE 2 PUFFS INTO THE LUNGS EVERY 6 (SIX) HOURS AS NEEDED FOR WHEEZING OR SHORTNESS OF BREATH. 8.5 Inhaler 0  . aspirin EC 81 MG tablet Take 81 mg by mouth daily.    . Azelastine-Fluticasone (DYMISTA) 137-50 MCG/ACT SUSP Place 1-2 sprays into the nose daily. (Patient taking differently: Place 1-2 sprays into the nose daily as needed (for allergies). ) 23 g 11  . Choline Fenofibrate 135 MG capsule Take 1 capsule (135 mg total) by mouth daily. 90 capsule 3  . clopidogrel (PLAVIX) 75 MG tablet Take 1 tablet (75 mg total) by mouth daily. 90 tablet 2  . famotidine (PEPCID) 20 MG tablet Take 20 mg by mouth daily as needed for heartburn.    . hydrochlorothiazide (HYDRODIURIL) 25 MG tablet Take 0.5 tablets (12.5 mg total) by mouth daily. 45 tablet 3  . isosorbide mononitrate (IMDUR) 30 MG 24 hr tablet Take 1 tablet (30 mg total) by mouth daily. 30 tablet 11  . metoprolol tartrate (LOPRESSOR) 25 MG tablet Take 1 tablet (25 mg total) by mouth 2 (two)  times daily. 180 tablet 3  . naproxen sodium (ANAPROX) 220 MG tablet  Take 220 mg by mouth 2 (two) times daily as needed (for headache).     . nitroGLYCERIN (NITROSTAT) 0.4 MG SL tablet Place 1 tablet (0.4 mg total) under the tongue every 5 (five) minutes as needed for chest pain. 25 tablet 4  . RABEprazole (ACIPHEX) 20 MG tablet Take 20 mg by mouth daily.    . Tiotropium Bromide Monohydrate (SPIRIVA RESPIMAT) 2.5 MCG/ACT AERS 2 puffs each am (Patient taking differently: Inhale 1 puff into the lungs every morning. ) 1 Inhaler 11  . rosuvastatin (CRESTOR) 20 MG tablet Take 1 tablet (20 mg total) by mouth daily. 90 tablet 3   No current facility-administered medications on file prior to visit.     Allergies  Allergen Reactions  . Lisinopril Cough and Rash  . Amlodipine Besylate Other (See Comments)    Headache, weakness, dizziness  . Propofol Other (See Comments)    Heart rate dropped    Past Medical History:  Diagnosis Date  . 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 CAD (coronary artery disease)    moderate to severe distal CAD with large epicardial arteries having only mild to moderate irregularities. Last cath in Dec 2006; s/p cath with Promus DES in distal RCA and Promus DES in the mid vessel in an overlapping fashion  . Chest pain   . Complication of anesthesia    heart rate dropping with propofol last procedure  . COPD (chronic Daniel pulmonary disease) ([place])   . ED (erectile dysfunction)   . Esophageal stricture   . External hemorrhoids without mention of complication   . GERD (gastroesophageal reflux disease)   . Headache(784.0)   . Hiatal hernia   . HLD (hyperlipidemia)   . HTN (hypertension)   . Nephrolithiasis   . Personal history of colonic polyps 03/12/2010   TUBULAR ADENOMA  . Tobacco abuse     Past Surgical History:  Procedure Laterality Date  . ANGIOPLASTY     X3    Social History   Tobacco Use  Smoking Status Former Smoker  . Packs/day:  0.50  . Years: 40.00  . Pack years: 20.00  . Types: Cigarettes  . Last attempt to quit: 08/25/2011  . Years since quitting: 5.5  Smokeless Tobacco Never Used    Social History   Substance and Sexual Activity  Alcohol Use Yes  . Alcohol/week: 0.0 oz   Comment: rare    Family History  Problem Relation Age of Onset  . Heart disease Father        hx of MI  . Diabetes Mother        and brother   . Kidney failure Mother   . Breast cancer Sister   . Colon cancer Neg Hx   . Stomach cancer Neg Hx     Reviw of Systems:  Reviewed in the HPI.  All other systems are negative.  Physical Exam: Blood pressure (!) 158/90, pulse 78, height 5\' 10"  (1.778 m), weight 197 lb 12.8 oz (89.7 kg), SpO2 97 %.  GEN:  Well nourished, well developed in no acute distress HEENT: Normal NECK: No JVD; No carotid bruits LYMPHATICS: No lymphadenopathy CARDIAC: RR, no murmurs, rubs, gallops RESPIRATORY:  Clear to auscultation without rales, wheezing or rhonchi  ABDOMEN: Soft, non-tender, non-distended MUSCULOSKELETAL:  No edema; No deformity  SKIN: Warm and dry NEUROLOGIC:  Alert and oriented x 3   ECG:    Assessment / Plan:   1.  Dysuria:  Vernor presents today with fevers and chills and dysuria.  I am concerned that he has a urinary tract infection or perhaps prostatitis.  I would hesitate to do a cath with possible PCI when he has an active infection. He will go see his primary MD today for blood work and UA ,? Urine culture   2. CAD  - seems to be stable His current shortness of breath seems to be related to an active infection    3.  Daniel Carey:       4. Hyperlipidemia:      Mertie Moores, MD  02/25/2017 11:20 AM    [facility] [place] [place],  [place], [place]  [address] Pager [phone] Phone: [phone]; Fax: [phone]

## 2017-02-25 NOTE — Patient Instructions (Addendum)
Medication Instructions:  Your physician recommends that you continue on your current medications as directed. Please refer to the Current Medication list given to you today.   Labwork: None Ordered   Testing/Procedures: None Ordered   Follow-Up: Keep your follow-up appointment with Dr. Acie Fredrickson on Dec. 20  If you need a refill on your cardiac medications before your next appointment, please call your pharmacy.   Thank you for choosing [facility]! Christen Bame, RN [phone]

## 2017-03-09 DIAGNOSIS — D72829 Elevated white blood cell count, unspecified: Secondary | ICD-10-CM | POA: Diagnosis not present

## 2017-04-02 ENCOUNTER — Encounter: Payer: Self-pay | Admitting: *Deleted

## 2017-04-06 DIAGNOSIS — I10 Essential (primary) hypertension: Secondary | ICD-10-CM | POA: Diagnosis not present

## 2017-04-06 DIAGNOSIS — R350 Frequency of micturition: Secondary | ICD-10-CM | POA: Diagnosis not present

## 2017-04-06 DIAGNOSIS — I2510 Atherosclerotic heart disease of native coronary artery without angina pectoris: Secondary | ICD-10-CM | POA: Diagnosis not present

## 2017-04-06 DIAGNOSIS — N390 Urinary tract infection, site not specified: Secondary | ICD-10-CM | POA: Diagnosis not present

## 2017-04-08 ENCOUNTER — Ambulatory Visit (INDEPENDENT_AMBULATORY_CARE_PROVIDER_SITE_OTHER): Payer: BLUE CROSS/BLUE SHIELD | Admitting: Cardiovascular Disease

## 2017-04-08 ENCOUNTER — Encounter: Payer: Self-pay | Admitting: Cardiovascular Disease

## 2017-04-08 VITALS — BP 114/76 | HR 77 | Ht 70.0 in | Wt 192.0 lb

## 2017-04-08 DIAGNOSIS — I2510 Atherosclerotic heart disease of native coronary artery without angina pectoris: Secondary | ICD-10-CM | POA: Diagnosis not present

## 2017-04-08 DIAGNOSIS — E782 Mixed hyperlipidemia: Secondary | ICD-10-CM | POA: Diagnosis not present

## 2017-04-08 LAB — LIPID PANEL
Chol/HDL Ratio: 3.1 ratio (ref 0.0–5.0)
Cholesterol, Total: 141 mg/dL (ref 100–199)
HDL: 45 mg/dL (ref >39)
LDL Calculated: 76 mg/dL (ref 0–99)
Triglycerides: 99 mg/dL (ref 0–149)
VLDL Cholesterol Cal: 20 mg/dL (ref 5–40)

## 2017-04-08 LAB — BASIC METABOLIC PANEL
BUN/Creatinine Ratio: 15 (ref 10–24)
BUN: 20 mg/dL (ref 8–27)
CO2: 25 mmol/L (ref 20–29)
Calcium: 9.7 mg/dL (ref 8.6–10.2)
Chloride: 99 mmol/L (ref 96–106)
Creatinine, Ser: 1.36 mg/dL — ABNORMAL HIGH (ref 0.76–1.27)
GFR calc Af Amer: 63 mL/min/{1.73_m2} (ref >59)
GFR calc non Af Amer: 54 mL/min/{1.73_m2} — ABNORMAL LOW (ref >59)
Glucose: 127 mg/dL — ABNORMAL HIGH (ref 65–99)
Potassium: 5.2 mmol/L (ref 3.5–5.2)
Sodium: 137 mmol/L (ref 134–144)

## 2017-04-08 LAB — HEPATIC FUNCTION PANEL
ALT: 17 IU/L (ref 0–44)
AST: 15 IU/L (ref 0–40)
Albumin: 4.1 g/dL (ref 3.6–4.8)
Alkaline Phosphatase: 62 IU/L (ref 39–117)
Bilirubin Total: 0.2 mg/dL (ref 0.0–1.2)
Bilirubin, Direct: 0.1 mg/dL (ref 0.00–0.40)
Total Protein: 6.9 g/dL (ref 6.0–8.5)

## 2017-04-08 NOTE — Patient Instructions (Signed)
Medication Instructions:  Your physician recommends that you continue on your current medications as directed. Please refer to the Current Medication list given to you today.  Labwork: Your physician recommends that you have lab work today- BMET, LFTs, and Lipids   Testing/Procedures: NONE  Follow-Up: Your physician wants you to follow-up in: 6 months with Dr. Acie Fredrickson. You will receive a reminder letter in the mail two months in advance. If you don't receive a letter, please call our office to schedule the follow-up appointment.   If you need a refill on your cardiac medications before your next appointment, please call your pharmacy.

## 2017-04-08 NOTE — Progress Notes (Signed)
[name] Date of Birth  1951-03-27       [facility] [address], [place], [place]  [address]   [place], [place]  [address] [phone]     [phone]   Fax  [phone]    Fax [phone]  Problem List: 1. CAD - 2.5 x 38 mm Promus Premier DES in the distal RCA and a 2.75 x 38 mm Promus Premier DES in the mid vessel in an overlapping fashion. The distal stent was post-dilated with a 2.5 x 20 mm [place] balloon. The proximal portion of the stent was post-dilated with a 3.0 x 20 mm [place] balloon ( June 30, 2012)  2. Bradycardia after getting propafol for endoscopy. 3. Obstructive sleep apnea:     History of Present Illness:  [name] is feeling well today.  He's been getting some exercise. He does yard work and walks on the treadmill occasionally. He had an episode of bradycardia after getting IV protocol for an endoscopy procedure. He was seen by [name]. His metoprolol dose was decreased. He has also had a bronchoscopy with [name]. He does not have any evidence of COPD.  Both he and his wife have stop smoking.  ( Aug 20, 2011)  Feb. 28, 2014:  He continues to have increasing chest pain.  He has not been smoking.  The pains are associated with exertion, last 5-10 minutes, resolve with 1 SL NTG.   He's had a mildly elevated TSH for the past year or so and has had some fatigue.    his TSH level 6.8.      His medical doctor started him on Synthroid. He gradually has titrated up to 0.1 mg a day. Since that time he's had increasing episodes of chest pain.  Aug 29, 2012:  [name] saw him in February we performed a cardiac catheterization because of increasing chest pain. He was found to have sequential stenosis in his mid right coronary artery and had PCI of his right coronary artery ( 2.5 x 38 mm Promus Premier DES in the distal RCA and a 2.75 x 38 mm Promus Premier DES in the mid vessel in an overlapping fashion. The  distal sten was post-dilated with a 2.5 x 20 mm [place] balloon. The proximal portion of the stent was post-dilated with a 3.0 x 20 mm  balloon)  He and his wife are concerned about whether or not the Plavix is active.  His generic plavix manufacturer has changed.   He would like to be tested.   October 14, 2012:  [name] has been having some occasional CP.  He has had some episodes of dyspnea while climbing up a hill playing golf.  This past Monday night, he had some chest pain - possible due to indigestions.  these  He had lost his NTG - he has refilled it now.    Nov. 10, 2014:  [name] is doing well.  He is working too much according to wife. he's had a few CP but not severe enough to take NTG.  Still has some dyspnea with climbing steps.   He had stenting in march 2014.  October 23, 2013:  [name] is doing well.   Playing golf.  Working in the yard.  Not exercising as much.   He has lost about 6 lbs.  He had labs drawn today.     Jan. 7, 2016:  He has been  waking up in the middle of the night and feels like he cant catch his  Breath. Still working out - on the treadmill twice this week and has done well.   Sept. 1, 2016:  Has had some short of breath. Does not get any exercise.  His CP is much better since starting CPAP .  Is having bloating  - thinks its due to the lipitor   July 19, 2015: Has had some shortness of breath.   With exertion . Happens occasionally but seem to be getting more frequently .  BP is elevated today  Has cut back on his salt .   Sept. 21, 2017:  Winton is seen today for follow up visit for his CAD Occasionally short of breath . Comes and goes .  BP is a little elevated today Has occasional CP  Able to do all that he wants to do   November 04, 2016: Still having some chest pain Not exertional ,  Seems very similar to his previous episodes of angina prior to stenting .   July 31 , 2108  Jonaven is seen today for follow up of a recent cath.  He was found to  have diffuse moderate - severe CAD - especially in the LAD  Was started on Imdur - is having a headache with that. Angina seems to be slightly  better.  He still has marked DOE - wife is very concerned about this .   is going to see pulmonary soon .  Sept. 24, 2018:  [name] is doing very well. His chest pain seemed to be better. Getting some exercise   A little bit more salt than usual over the weekend. His diastolic blood pressure today is 90.  Nov. 8, 2018:    Benjermin is seen today  Has worsening dyspnea.   Chest tightness Also had a bladder infection  - burning with urination. ,   Fevers, chills.   Temp of 99.8 Still having fevers and chills  His dyspnea seems to be worse with his chills.  No blood in urine,   extremy dysuria and frequency + cough , no hemoptysis, No pleuretic CP  No real angina   April 08, 2017:  Lorenda Hatchet is seen back for follow up visit Has urosepsis when I last saw him ( UTI with bacteremia )  Treated with ABx Now is having dysuria .   Is seeing urologist tomorrow .   No serious angina .     Current Outpatient Medications on File Prior to Visit  Medication Sig Dispense Refill  . albuterol (PROAIR HFA) 108 (90 Base) MCG/ACT inhaler NHALE 2 PUFFS INTO THE LUNGS EVERY 6 (SIX) HOURS AS NEEDED FOR WHEEZING OR SHORTNESS OF BREATH. 8.5 Inhaler 0  . aspirin EC 81 MG tablet Take 81 mg by mouth daily.    . Azelastine-Fluticasone (DYMISTA) 137-50 MCG/ACT SUSP Place 1-2 sprays into the nose daily. (Patient taking differently: Place 1-2 sprays into the nose daily as needed (for allergies). ) 23 g 11  . Choline Fenofibrate 135 MG capsule Take 1 capsule (135 mg total) by mouth daily. 90 capsule 3  . clopidogrel (PLAVIX) 75 MG tablet Take 1 tablet (75 mg total) by mouth daily. 90 tablet 2  . famotidine (PEPCID) 20 MG tablet Take 20 mg by mouth daily as needed for heartburn.    . hydrochlorothiazide (HYDRODIURIL) 25 MG tablet Take 0.5 tablets (12.5 mg total) by mouth  daily. 45 tablet 3  . isosorbide mononitrate (IMDUR)  30 MG 24 hr tablet Take 1 tablet (30 mg total) by mouth daily. 30 tablet 11  . metoprolol tartrate (LOPRESSOR) 25 MG tablet Take 1 tablet (25 mg total) by mouth 2 (two) times daily. 180 tablet 3  . naproxen sodium (ANAPROX) 220 MG tablet Take 220 mg by mouth 2 (two) times daily as needed (for headache).     . nitroGLYCERIN (NITROSTAT) 0.4 MG SL tablet Place 1 tablet (0.4 mg total) under the tongue every 5 (five) minutes as needed for chest pain. 25 tablet 4  . RABEprazole (ACIPHEX) 20 MG tablet Take 20 mg by mouth daily.    Marland Kitchen sulfamethoxazole-trimethoprim (BACTRIM DS,SEPTRA DS) 800-160 MG tablet Take 1 tablet by mouth 2 (two) times daily.    . Tiotropium Bromide Monohydrate (SPIRIVA RESPIMAT) 2.5 MCG/ACT AERS 2 puffs each am (Patient taking differently: Inhale 1 puff into the lungs every morning. ) 1 Inhaler 11  . rosuvastatin (CRESTOR) 20 MG tablet Take 1 tablet (20 mg total) by mouth daily. 90 tablet 3   No current facility-administered medications on file prior to visit.     Allergies  Allergen Reactions  . Lisinopril Cough and Rash  . Amlodipine Besylate Other (See Comments)    Headache, weakness, dizziness  . Propofol Other (See Comments)    Heart rate dropped    Past Medical History:  Diagnosis Date  . 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 CAD (coronary artery disease)    moderate to severe distal CAD with large epicardial arteries having only mild to moderate irregularities. Last cath in Dec 2006; s/p cath with Promus DES in distal RCA and Promus DES in the mid vessel in an overlapping fashion  . Chest pain   . Complication of anesthesia    heart rate dropping with propofol last procedure  . COPD (chronic obstructive pulmonary disease) ([place])   . ED (erectile dysfunction)   . Esophageal stricture   . External hemorrhoids without mention of complication   . GERD (gastroesophageal reflux disease)   .  Headache(784.0)   . Hiatal hernia   . HLD (hyperlipidemia)   . HTN (hypertension)   . Nephrolithiasis   . Personal history of colonic polyps 03/12/2010   TUBULAR ADENOMA  . Tobacco abuse     Past Surgical History:  Procedure Laterality Date  . ANGIOPLASTY     X3  . COLONOSCOPY N/A 02/16/2017   Procedure: COLONOSCOPY;  Surgeon: Jerene Bears, MD;  Location: [facility];  Service: Gastroenterology;  Laterality: N/A;  . LEFT HEART CATH AND CORONARY ANGIOGRAPHY N/A 11/05/2016   Procedure: Left Heart Cath and Coronary Angiography;  Surgeon: Nelva Bush, MD;  Location: [facility];  Service: Cardiovascular;  Laterality: N/A;  . PERCUTANEOUS CORONARY ROTOBLATOR INTERVENTION (PCI-R) N/A 06/30/2012   Procedure: PERCUTANEOUS CORONARY ROTOBLATOR INTERVENTION (PCI-R);  Surgeon: Burnell Blanks, MD;  Location: [facility] CATH LAB;  Service: Cardiovascular;  Laterality: N/A;  . VIDEO BRONCHOSCOPY  08/13/2011   Procedure: VIDEO BRONCHOSCOPY WITHOUT FLUORO;  Surgeon: Tanda Rockers, MD;  Location: [facility];  Service: Cardiopulmonary;  Laterality: Bilateral;    Social History   Tobacco Use  Smoking Status Former Smoker  . Packs/day: 0.50  . Years: 40.00  . Pack years: 20.00  . Types: Cigarettes  . Last attempt to quit: 08/25/2011  . Years since quitting: 5.6  Smokeless Tobacco Never Used    Social History   Substance and Sexual Activity  Alcohol Use Yes  .  Alcohol/week: 0.0 oz   Comment: rare    Family History  Problem Relation Age of Onset  . Heart disease Father        hx of MI  . Diabetes Mother        and brother   . Kidney failure Mother   . Breast cancer Sister   . Colon cancer Neg Hx   . Stomach cancer Neg Hx     Reviw of Systems:  Reviewed in the HPI.  All other systems are negative.  Physical Exam: Blood pressure 114/76, pulse 77, height 5\' 10"  (1.778 m), weight 192 lb (87.1 kg), SpO2 98 %.  GEN:  Well nourished, well developed in no acute  distress HEENT: Normal NECK: No JVD; No carotid bruits LYMPHATICS: No lymphadenopathy CARDIAC: RR, no murmurs, rubs, gallops RESPIRATORY:  Clear to auscultation without rales, wheezing or rhonchi  ABDOMEN: Soft, non-tender, non-distended MUSCULOSKELETAL:  No edema; No deformity  SKIN: Warm and dry NEUROLOGIC:  Alert and oriented x 3   ECG:   Assessment / Plan:    1. CAD  -  No significant angina  Continue meds.   2. Hyperlipidemia:   Has changed to crestor Check labs today   3.  Obstructive sleep apnea:      Managed by primary MD      Mertie Moores, MD  04/08/2017 8:15 AM    [address],  [place], [place]  [address] Pager [phone] Phone: [phone]; Fax: [phone]

## 2017-04-09 DIAGNOSIS — R310 Gross hematuria: Secondary | ICD-10-CM | POA: Diagnosis not present

## 2017-04-09 DIAGNOSIS — N411 Chronic prostatitis: Secondary | ICD-10-CM | POA: Diagnosis not present

## 2017-04-14 DIAGNOSIS — R310 Gross hematuria: Secondary | ICD-10-CM | POA: Diagnosis not present

## 2017-04-26 ENCOUNTER — Ambulatory Visit: Payer: BLUE CROSS/BLUE SHIELD | Admitting: Internal Medicine

## 2017-04-28 DIAGNOSIS — R310 Gross hematuria: Secondary | ICD-10-CM | POA: Diagnosis not present

## 2017-04-29 ENCOUNTER — Telehealth: Payer: Self-pay | Admitting: Internal Medicine

## 2017-04-29 NOTE — Telephone Encounter (Signed)
Unable to reach pt no voice mail.

## 2017-05-04 NOTE — Telephone Encounter (Signed)
Letter mailed to pt to set up OV.

## 2017-05-04 NOTE — Telephone Encounter (Signed)
Agree that UTI is not an expected complication from colonoscopy. I think best to discuss this in person with the patient.  I recommend he schedule an office visit so that I can check on him and further discuss to ensure he is comfortable with the explanation. Next available OV if okay with patient.

## 2017-05-04 NOTE — Telephone Encounter (Signed)
Pts wife wanted to let Dr. Hilarie Fredrickson know that several days after his colonoscopy the pt developed a bad UTI, reports it was from E Coli. Pt had to take 2 rounds of antibiotics and see a urologist to get this cleared. Wife feels this was caused by the colonoscopy. Tried to explain to her that the colonoscopy should not be related to the UTI but she feels differently.  She reports that the E coli was in his bloodstream and went to his bladder and caused the UTI. Let her know that I would notify Dr. Hilarie Fredrickson about her concerns.

## 2017-06-10 ENCOUNTER — Ambulatory Visit: Payer: BLUE CROSS/BLUE SHIELD | Admitting: Internal Medicine

## 2017-07-01 ENCOUNTER — Ambulatory Visit: Payer: BLUE CROSS/BLUE SHIELD | Admitting: Internal Medicine

## 2017-08-02 ENCOUNTER — Ambulatory Visit (INDEPENDENT_AMBULATORY_CARE_PROVIDER_SITE_OTHER): Payer: BLUE CROSS/BLUE SHIELD | Admitting: Internal Medicine

## 2017-08-02 ENCOUNTER — Encounter: Payer: Self-pay | Admitting: Internal Medicine

## 2017-08-02 VITALS — BP 135/83 | HR 68 | Ht 70.0 in | Wt 195.5 lb

## 2017-08-02 DIAGNOSIS — E119 Type 2 diabetes mellitus without complications: Secondary | ICD-10-CM

## 2017-08-02 DIAGNOSIS — E039 Hypothyroidism, unspecified: Secondary | ICD-10-CM

## 2017-08-02 LAB — POCT GLYCOSYLATED HEMOGLOBIN (HGB A1C): Hemoglobin A1C: 6.4

## 2017-08-02 NOTE — Patient Instructions (Signed)
.  Please stop at the lab.  If we need to start Levothyroxine, take the thyroid hormone every day, with water, at least 30 minutes before breakfast, separated by at least 4 hours from: - acid reflux medications - calcium - iron - multivitamins  Please come back for a follow-up appointment in 6 months.    

## 2017-08-02 NOTE — Progress Notes (Signed)
Patient ID: Daniel Carey, male   DOB: 1950-11-13, 67 y.o.   MRN: [id number]   HPI  Daniel Carey is a 67 y.o.-year-old male, returning for f/u for hypothyroidism, dx 2012 and DM2, diet-controlled, non-insulin-dependent, with complications (CAD). Last visit 8 months ago.  Pt. has been dx with hypothyroidism in 2012 >> he was on Levothyroxine up to 200 mcg in 2016 >> did not feel well >> sto stopped.  We are following him without medicines per his preference and his TSH is fluctuating close to the upper limit of normal.  Reviewed previous TFTs: Lab Results  Component Value Date   TSH 4.89 (H) 12/08/2016   TSH 4.13 03/06/2016   TSH 9.84 (H) 09/05/2015   TSH 0.23 (L) 05/07/2015   TSH 2.49 08/02/2014   TSH 4.695 (H) 10/26/2013   TSH 1.56 06/08/2012   TSH 6.83 (H) 07/20/2011   TSH 5.61 (H) 07/14/2011   TSH 5.53 (H) 12/05/2010   FREET4 0.83 12/08/2016   FREET4 0.67 03/06/2016   FREET4 0.66 09/05/2015   FREET4 1.25 05/07/2015   FREET4 0.79 08/02/2014    Lab Results  Component Value Date   T3FREE 3.3 12/08/2016   T3FREE 3.1 03/06/2016   T3FREE 3.2 09/05/2015   T3FREE 3.9 05/07/2015   T3FREE 2.9 08/02/2014   T3FREE 2.8 07/20/2011   Pt denies: - feeling nodules in neck - hoarseness - dysphagia - choking - SOB with lying down  He sees Dr Acie Fredrickson >> had angioplasy 3 times in the past, 2 stents in last year. He had an AMI 1996 (mild). He has a Mobitz 1 AVB. Had a catheterization in 10/2016 >> partial blockages.  On Crestor and Imdur.  DM2: -Reviewed latest HbA1c levels: Lab Results  Component Value Date   HGBA1C 6.3 12/08/2016   HGBA1C 7.1 (H) 09/05/2015   HGBA1C 6.7 (H) 05/07/2015  07/03/2016: HbA1c 6.8%  He checks his sugars seldom - ave: 110, 85-165: - am: n/c - 2h after b'fast: n/c - lunch: n/c - 2h after lunch: 85-140 >> n/c - dinner: 113, 159 >> n/c - 2h after dinner: 87-154 >> n/c - bedtime: n/c  Meter: ReliOn  No history of CKD. Last BUN/Cr: Lab Results   Component Value Date   BUN 20 04/08/2017   BUN 16 01/11/2017   BUN 15 11/17/2016   CREATININE 1.36 (H) 04/08/2017   CREATININE 0.94 01/11/2017   CREATININE 1.21 11/17/2016  He has a history of high potassium, therefore, he is off ACE inhibitor/ARB's . Reviewed potassium levels: Lab Results  Component Value Date   K 5.2 04/08/2017   K 5.2 01/11/2017   K 5.4 (H) 11/17/2016   K 4.8 11/05/2016   K 5.6 (H) 11/04/2016   + HL; last cholesterol level: Lab Results  Component Value Date   CHOL 141 04/08/2017   HDL 45 04/08/2017   LDLCALC 76 04/08/2017   LDLDIRECT 107.0 02/27/2013   TRIG 99 04/08/2017   CHOLHDL 3.1 04/08/2017  On Crestor and fenofibrate. Last eye exam in 03/2018: No DR, no glaucoma, + cataracts - incipient.   He stays active playing golf.  He has OSA >> wears a C-pap.   He had a colonoscopy 02/2017 >> E coli sepsis.  ROS: Constitutional: no weight gain/no weight loss, no fatigue, no subjective hyperthermia, no subjective hypothermia Eyes: no blurry vision, no xerophthalmia ENT: no sore throat, + see HPI Cardiovascular: no CP/no SOB/no palpitations/no leg swelling Respiratory: no cough/no SOB/no wheezing Gastrointestinal: no N/no V/no D/no  C/no acid reflux Musculoskeletal: no muscle aches/no joint aches Skin: no rashes, no hair loss Neurological: no tremors/no numbness/no tingling/no dizziness  I reviewed pt's medications, allergies, PMH, social hx, family hx, and changes were documented in the history of present illness. Otherwise, unchanged from my initial visit note.  Past Medical History:  Diagnosis Date  . 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 CAD (coronary artery disease)    moderate to severe distal CAD with large epicardial arteries having only mild to moderate irregularities. Last cath in Dec 2006; s/p cath with Promus DES in distal RCA and Promus DES in the mid vessel in an overlapping fashion  . Chest pain   . Complication of  anesthesia    heart rate dropping with propofol last procedure  . COPD (chronic obstructive pulmonary disease) ([place])   . ED (erectile dysfunction)   . Esophageal stricture   . External hemorrhoids without mention of complication   . GERD (gastroesophageal reflux disease)   . Headache(784.0)   . Hiatal hernia   . HLD (hyperlipidemia)   . HTN (hypertension)   . Nephrolithiasis   . Personal history of colonic polyps 03/12/2010   TUBULAR ADENOMA  . Tobacco abuse    Past Surgical History:  Procedure Laterality Date  . ANGIOPLASTY     X3  . COLONOSCOPY N/A 02/16/2017   Procedure: COLONOSCOPY;  Surgeon: Jerene Bears, MD;  [facility]: [facility];  Service: Gastroenterology;  Laterality: N/A;  . LEFT HEART CATH AND CORONARY ANGIOGRAPHY N/A 11/05/2016   Procedure: Left Heart Cath and Coronary Angiography;  Surgeon: Nelva Bush, MD;  Location: [facility] LAB;  Service: Cardiovascular;  Laterality: N/A;  . PERCUTANEOUS CORONARY ROTOBLATOR INTERVENTION (PCI-R) N/A 06/30/2012   Procedure: PERCUTANEOUS CORONARY ROTOBLATOR INTERVENTION (PCI-R);  Surgeon: Burnell Blanks, MD;  Location[facility] CATH LAB;  Service: Cardiovascular;  Laterality: N/A;  . VIDEO BRONCHOSCOPY  08/13/2011   Procedure: VIDEO BRONCHOSCOPY WITHOUT FLUORO;  Surgeon: Tanda Rockers, MD;  Location: [facility];  Service: Cardiopulmonary;  Laterality: Bilateral;   History   Social History  . 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 Smoking status: Former Smoker -- 0.50 packs/day for 40 years    Types: Cigarettes    Quit date: 08/25/2011  . Smokeless tobacco: Never Used  . Alcohol Use: 0.0 oz/week     Comment: rare  . Drug Use: No   Current Outpatient Medications on File Prior to Visit  Medication Sig Dispense Refill  . albuterol (PROAIR HFA) 108 (90 Base) MCG/ACT inhaler NHALE 2 PUFFS INTO THE  LUNGS EVERY 6 (SIX) HOURS AS NEEDED FOR WHEEZING OR SHORTNESS OF BREATH. 8.5 Inhaler 0  . aspirin EC 81 MG tablet Take 81 mg by mouth daily.    . Azelastine-Fluticasone (DYMISTA) 137-50 MCG/ACT SUSP Place 1-2 sprays into the nose daily. (Patient taking differently: Place 1-2 sprays into the nose daily as needed (for allergies). ) 23 g 11  . Choline Fenofibrate 135 MG capsule Take 1 capsule (135 mg total) by mouth daily. 90 capsule 3  . clopidogrel (PLAVIX) 75 MG tablet Take 1 tablet (75 mg total) by mouth daily. 90 tablet 2  . famotidine (PEPCID) 20 MG tablet Take 20 mg by mouth daily as needed for heartburn.    . hydrochlorothiazide (HYDRODIURIL) 25 MG  tablet Take 0.5 tablets (12.5 mg total) by mouth daily. 45 tablet 3  . isosorbide mononitrate (IMDUR) 30 MG 24 hr tablet Take 1 tablet (30 mg total) by mouth daily. 30 tablet 11  . metoprolol tartrate (LOPRESSOR) 25 MG tablet Take 1 tablet (25 mg total) by mouth 2 (two) times daily. 180 tablet 3  . naproxen sodium (ANAPROX) 220 MG tablet Take 220 mg by mouth 2 (two) times daily as needed (for headache).     . nitroGLYCERIN (NITROSTAT) 0.4 MG SL tablet Place 1 tablet (0.4 mg total) under the tongue every 5 (five) minutes as needed for chest pain. 25 tablet 4  . RABEprazole (ACIPHEX) 20 MG tablet Take 20 mg by mouth daily.    . rosuvastatin (CRESTOR) 20 MG tablet Take 1 tablet (20 mg total) by mouth daily. 90 tablet 3  . Tiotropium Bromide Monohydrate (SPIRIVA RESPIMAT) 2.5 MCG/ACT AERS 2 puffs each am (Patient taking differently: Inhale 1 puff into the lungs every morning. ) 1 Inhaler 11   No current facility-administered medications on file prior to visit.    Allergies  Allergen Reactions  . Lisinopril Cough and Rash  . Amlodipine Besylate Other (See Comments)    Headache, weakness, dizziness  . Propofol Other (See Comments)    Heart rate dropped   Family History  Problem Relation Age of Onset  . Heart disease Father        hx of MI  .  Diabetes Mother        and brother   . Kidney failure Mother   . Breast cancer Sister   . Colon cancer Neg Hx   . Stomach cancer Neg Hx    PE: BP 135/83   Pulse 68   Ht 5\' 10"  (1.778 m)   Wt 195 lb 8 oz (88.7 kg)   SpO2 95%   BMI 28.05 kg/m  Wt Readings from Last 3 Encounters:  08/02/17 195 lb 8 oz (88.7 kg)  04/08/17 192 lb (87.1 kg)  02/25/17 197 lb 12.8 oz (89.7 kg)   Constitutional: Slightly overweight, in NAD Eyes: PERRLA, EOMI, no exophthalmos ENT: moist mucous membranes, no thyromegaly, no cervical lymphadenopathy Cardiovascular: RRR, No MRG Respiratory: CTA B Gastrointestinal: abdomen soft, NT, ND, BS+ Musculoskeletal: no deformities, strength intact in all 4 Skin: moist, warm, no rashes Neurological: no tremor with outstretched hands, DTR normal in all 4  ASSESSMENT: 1. Mild hypothyroidism  2. DM2, diet- controlled - + CAD  PLAN:  1. Patient with few years of mild hypothyroidism, off levothyroxine - He appears euthyroid and has no complaints at this visit - His TFTs fluctuate, but we discussed that we absolutely need to start levothyroxine if they increase above 10 or if his free thyroid hormones become abnormal - At last visit, his TSH was only slightly above the upper limit of normal, with normal free T4 and free T3.  We did not have to start levothyroxine then. - He is very reticent to start levothyroxine due to previous experience with the medication.  However, I believe that his previous reaction to it was because of the high dose. - We will check his TFTs today.  If absolutely needed, we can start a very low dose of levothyroxine, 25 mcg daily. - I will see him back in 6 months   2. DM2, controlled reviewed latest HbA1c from last visit, which was excellent, at 6.3%. - He is still very reticent to start medicines, but agrees to start metformin if absolutely  needed - today, HbA1c is 6.4% (stable, only slightly higher) - Strongly advised him to start  checking sugars at different times of the day - check 1x every other day, rotating checks - advised for yearly eye exams >> he is UTD - Return to clinic in 6 mo with sugar log   Philemon Kingdom, MD PhD [facility]

## 2017-08-03 LAB — TSH: TSH: 4.5 u[IU]/mL (ref 0.35–4.50)

## 2017-08-03 LAB — T4, FREE: Free T4: 0.65 ng/dL (ref 0.60–1.60)

## 2017-08-03 LAB — T3, FREE: T3, Free: 2.6 pg/mL (ref 2.3–4.2)

## 2017-08-21 ENCOUNTER — Other Ambulatory Visit: Payer: Self-pay | Admitting: Cardiovascular Disease

## 2017-08-24 DIAGNOSIS — G4733 Obstructive sleep apnea (adult) (pediatric): Secondary | ICD-10-CM | POA: Diagnosis not present

## 2017-09-02 DIAGNOSIS — M545 Low back pain: Secondary | ICD-10-CM | POA: Diagnosis not present

## 2017-09-02 DIAGNOSIS — M48061 Spinal stenosis, lumbar region without neurogenic claudication: Secondary | ICD-10-CM | POA: Diagnosis not present

## 2017-09-02 DIAGNOSIS — M4186 Other forms of scoliosis, lumbar region: Secondary | ICD-10-CM | POA: Diagnosis not present

## 2017-09-02 DIAGNOSIS — M5136 Other intervertebral disc degeneration, lumbar region: Secondary | ICD-10-CM | POA: Diagnosis not present

## 2017-09-08 ENCOUNTER — Ambulatory Visit: Payer: BLUE CROSS/BLUE SHIELD | Attending: Specialist | Admitting: Physical Therapy

## 2017-09-08 ENCOUNTER — Encounter: Payer: Self-pay | Admitting: Physical Therapy

## 2017-09-08 DIAGNOSIS — M545 Low back pain: Secondary | ICD-10-CM | POA: Diagnosis not present

## 2017-09-08 DIAGNOSIS — R293 Abnormal posture: Secondary | ICD-10-CM

## 2017-09-08 DIAGNOSIS — M546 Pain in thoracic spine: Secondary | ICD-10-CM | POA: Insufficient documentation

## 2017-09-08 NOTE — Therapy (Signed)
[facility] [place], [place], [address] Phone: [phone]   Fax:  [phone]  Physical Therapy Evaluation  Patient Details  Name: Daniel Carey MRN: [id number] Date of Birth: 11/25/1950 Referring Provider: Susa Day   Encounter Date: 09/08/2017  PT End of Session - 09/08/17 2036    Visit Number  1    Number of Visits  8    Date for PT Re-Evaluation  10/13/17    PT Start Time  1300    PT Stop Time  1346    PT Time Calculation (min)  46 min    Activity Tolerance  Patient tolerated treatment well    Behavior During Therapy  [facility] for tasks assessed/performed       Past Medical History:  Diagnosis Date  . 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 CAD (coronary artery disease)    moderate to severe distal CAD with large epicardial arteries having only mild to moderate irregularities. Last cath in Dec 2006; s/p cath with Promus DES in distal RCA and Promus DES in the mid vessel in an overlapping fashion  . Chest pain   . Complication of anesthesia    heart rate dropping with propofol last procedure  . COPD (chronic obstructive pulmonary disease) ([place])   . ED (erectile dysfunction)   . Esophageal stricture   . External hemorrhoids without mention of complication   . GERD (gastroesophageal reflux disease)   . Headache(784.0)   . Hiatal hernia   . HLD (hyperlipidemia)   . HTN (hypertension)   . Nephrolithiasis   . Personal history of colonic polyps 03/12/2010   TUBULAR ADENOMA  . Tobacco abuse     Past Surgical History:  Procedure Laterality Date  . ANGIOPLASTY     X3  . COLONOSCOPY N/A 02/16/2017   Procedure: COLONOSCOPY;  Surgeon: Jerene Bears, MD;  [facility]: [facility];  Service: Gastroenterology;  Laterality: N/A;  . LEFT HEART CATH AND CORONARY ANGIOGRAPHY N/A 11/05/2016   Procedure: Left Heart Cath and Coronary Angiography;  Surgeon: Nelva Bush, MD;  Location: [place] CV LAB;   Service: Cardiovascular;  Laterality: N/A;  . PERCUTANEOUS CORONARY ROTOBLATOR INTERVENTION (PCI-R) N/A 06/30/2012   Procedure: PERCUTANEOUS CORONARY ROTOBLATOR INTERVENTION (PCI-R);  Surgeon: Burnell Blanks, MD;  Location: [facility] CATH LAB;  Service: Cardiovascular;  Laterality: N/A;  . VIDEO BRONCHOSCOPY  08/13/2011   Procedure: VIDEO BRONCHOSCOPY WITHOUT FLUORO;  Surgeon: Tanda Rockers, MD;  [facility];  Service: Cardiopulmonary;  Laterality: Bilateral;    There were no vitals filed for this visit.   Subjective Assessment - 09/08/17 2016    Subjective  Patient arrives with wife to physical therapy with reports of low back pain and right hip pain. Patient states pain began insidiously and mainly is felt while standing and walking. Patient reports having an x-ray which found mild scoliosis. Patient reports pain at worst while standing and walking can rate a 7/10. Patient often must sit and rest to decrease pain before walking/standing again. Pain at best is 0/10. Patient's goals are to decrease pain and return to PLOF.    Patient is accompained by:  Family member Wife    Pertinent History  Heart trouble    Limitations  Sitting;Walking;Standing    Currently in Pain?  No/denies         [facility] PT Assessment - 09/08/17 0001      Assessment   Medical Diagnosis  low back  pain    Referring Provider  Susa Day    Onset Date/Surgical Date  -- more than a month ago    Next MD Visit  one month    Prior Therapy  no      Balance Screen   Has the patient fallen in the past 6 months  No    Has the patient had a decrease in activity level because of a fear of falling?   No    Is the patient reluctant to leave their home because of a fear of falling?   No      Home Film/video editor residence    Living Arrangements  Spouse/significant other      ROM / Strength   AROM / PROM / Strength  AROM;Strength      AROM   Overall AROM   Within functional limits  for tasks performed    AROM Assessment Site  Lumbar    Lumbar Flexion  4.5 inches finger tip to fllor    Lumbar Extension  WFL    Lumbar - Right Side Bend  23.5 inches finger tip to floor    Lumbar - Left Side Bend  22.5 inches finger tip to floor      Strength   Strength Assessment Site  Lumbar;Hip;Knee    Right/Left Hip  Right;Left    Right Hip Flexion  4+/5    Right Hip Extension  4+/5    Right Hip ABduction  4/5    Left Hip Flexion  4/5    Left Hip Extension  4+/5    Left Hip ABduction  4/5    Right/Left Knee  Right;Left    Right Knee Flexion  4/5    Right Knee Extension  5/5    Left Knee Flexion  4/5    Left Knee Extension  5/5    Lumbar Flexion  3/5      Palpation   Palpation comment  tender to palpation along mid thoracic region, no palpable tenderness in lumbar spine or right hip.      Special Tests   Other special tests  Equal leg lengths and ASIS      Ambulation/Gait   Gait Pattern  Within Functional Limits    Ambulation Surface  Level                Objective measurements completed on examination: See above findings.      [facility] Adult PT Treatment/Exercise - 09/08/17 0001      Manual Therapy   Manual Therapy  Taping    Manual therapy comments  postural taping     Kinesiotex  Facilitate Muscle      Kinesiotix   Facilitate Muscle   postural taping provided by Claudie Revering PTA             PT Education - 09/08/17 2036    Education provided  Yes    Education Details  draw in    Person(s) Educated  Patient    Methods  Demonstration;Explanation    Comprehension  Verbalized understanding;Returned demonstration;Verbal cues required          PT Long Term Goals - 09/08/17 2039      PT LONG TERM GOAL #1   Title  Patient will be independent in HEP    Time  4    Period  Weeks    Status  New      PT LONG TERM GOAL #2   Title  Patient will report ability to stand with less than 4/10 pain to perform work duties.    Time  4    Period   Weeks    Status  New      PT LONG TERM GOAL #3   Title  Patient will report ability to walk community distances with less than 4/10 pain to perform work duties.    Time  4    Period  Weeks    Status  New      PT LONG TERM GOAL #4   Title  Patient will demonstrate 5/5 bilateral hip abduction strength to improve stability during functional activities.     Time  4    Status  New             Plan - 09/08/17 2037    Clinical Impression Statement  Patient is a 67 year old male who presents to physical therapy with low back pain and hip pain and decreased LE strength. Patient demonstrates [facility] lumbar AROM with no pain reported in any plane of motion. Patient tender to palpation along mid thoracic spine. Patient demonstrates equal leg length and ASIS in supine. Patient denies any numbness or tingling. Patient demonstrates a forward head and rounded shoulders, R elevated shoulder, and mild R rib hump in standing. Patient would benefit from skilled physical therapy to address deficits and address patient's goals.    History and Personal Factors relevant to plan of care:  Heart trouble AV block Mobitz I    Clinical Presentation  Stable    Clinical Decision Making  Low    Rehab Potential  Good    PT Frequency  2x / week    PT Duration  4 weeks    PT Treatment/Interventions  ADLs/Self Care Home Management;Traction;Moist Heat;Cryotherapy;Electrical Stimulation;Therapeutic exercise;Patient/family education;Taping;Dry needling;Manual techniques;Passive range of motion    PT Next Visit Plan  nustep, core strengthening, taping for posture, modalities PRN for pain relief.    Consulted and Agree with Plan of Care  Patient       Patient will benefit from skilled therapeutic intervention in order to improve the following deficits and impairments:  Pain, Postural dysfunction, Difficulty walking  Visit Diagnosis: Acute bilateral low back pain, with sciatica presence unspecified  Pain in thoracic  spine     Problem List Patient Active Problem List   Diagnosis Date Noted  . Benign neoplasm of cecum   . DOE (dyspnea on exertion) 07/19/2015  . Controlled type 2 diabetes mellitus without complication, without long-term current use of insulin ([place]) 05/07/2015  . Obstructive sleep apnea 04/26/2014  . Hypothyroidism 10/23/2013  . Acid reflux 08/30/2013  . Malaise and fatigue 08/30/2013  . 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 Upper airway cough syndrome 07/29/2011  . Hemoptysis 07/29/2011  . Heart block 07/28/2011  . Other abnormal glucose 07/20/2011  . AV block, Mobitz 1 07/14/2011  . Benign neoplasm of colon 07/08/2011  . Gastritis 07/08/2011  . Diverticulosis of colon (without mention of hemorrhage) 07/08/2011  . History of colonic polyps 12/23/2010  . Routine general medical examination at a health care facility 12/05/2010  . TOBACCO USE 03/10/2010  . SINUSITIS, CHRONIC 02/13/2010  . HIATAL HERNIA 12/22/2007  . Hyperlipidemia 12/30/2006  . Essential hypertension 12/30/2006  . CAD (coronary artery disease) 12/30/2006  . COPD GOLD III  12/30/2006  . GERD 12/30/2006    Gabriela Eves, PT, DPT 09/08/2017, 8:50 PM  [facility] [address] [place], [place], [address]  Phone: [phone]   Fax:  [phone]  Name: Daniel Carey MRN: [id number] Date of Birth: 1951-02-14

## 2017-09-16 ENCOUNTER — Ambulatory Visit: Payer: BLUE CROSS/BLUE SHIELD | Admitting: Physical Therapy

## 2017-09-16 DIAGNOSIS — M545 Low back pain: Secondary | ICD-10-CM | POA: Diagnosis not present

## 2017-09-16 DIAGNOSIS — R293 Abnormal posture: Secondary | ICD-10-CM | POA: Diagnosis not present

## 2017-09-16 DIAGNOSIS — M546 Pain in thoracic spine: Secondary | ICD-10-CM | POA: Diagnosis not present

## 2017-09-16 NOTE — Therapy (Signed)
[facility] [place], [place], [address] Phone: [phone]   Fax:  [phone]  Physical Therapy Treatment  Patient Details  Name: Daniel Carey MRN: [id number] Date of Birth: October 02, 1950 Referring Provider: Susa Day   Encounter Date: 09/16/2017  PT End of Session - 09/16/17 1403    Visit Number  2    Number of Visits  8    Date for PT Re-Evaluation  10/13/17    PT Start Time  6195    PT Stop Time  1430    PT Time Calculation (min)  45 min    Activity Tolerance  Patient tolerated treatment well    Behavior During Therapy  [facility] for tasks assessed/performed       Past Medical History:  Diagnosis Date  . 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 CAD (coronary artery disease)    moderate to severe distal CAD with large epicardial arteries having only mild to moderate irregularities. Last cath in Dec 2006; s/p cath with Promus DES in distal RCA and Promus DES in the mid vessel in an overlapping fashion  . Chest pain   . Complication of anesthesia    heart rate dropping with propofol last procedure  . COPD (chronic obstructive pulmonary disease) ([place])   . ED (erectile dysfunction)   . Esophageal stricture   . External hemorrhoids without mention of complication   . GERD (gastroesophageal reflux disease)   . Headache(784.0)   . Hiatal hernia   . HLD (hyperlipidemia)   . HTN (hypertension)   . Nephrolithiasis   . Personal history of colonic polyps 03/12/2010   TUBULAR ADENOMA  . Tobacco abuse     Past Surgical History:  Procedure Laterality Date  . ANGIOPLASTY     X3  . COLONOSCOPY N/A 02/16/2017   Procedure: COLONOSCOPY;  Surgeon: Jerene Bears, MD;  [facility];  Service: Gastroenterology;  Laterality: N/A;  . LEFT HEART CATH AND CORONARY ANGIOGRAPHY N/A 11/05/2016   Procedure: Left Heart Cath and Coronary Angiography;  Surgeon: Nelva Bush, MD;  Location: [place] CV LAB;   Service: Cardiovascular;  Laterality: N/A;  . PERCUTANEOUS CORONARY ROTOBLATOR INTERVENTION (PCI-R) N/A 06/30/2012   Procedure: PERCUTANEOUS CORONARY ROTOBLATOR INTERVENTION (PCI-R);  Surgeon: Burnell Blanks, MD;  Location: [facility] CATH LAB;  Service: Cardiovascular;  Laterality: N/A;  . VIDEO BRONCHOSCOPY  08/13/2011   Procedure: VIDEO BRONCHOSCOPY WITHOUT FLUORO;  Surgeon: Tanda Rockers, MD;  [facility];  Service: Cardiopulmonary;  Laterality: Bilateral;    There were no vitals filed for this visit.  Subjective Assessment - 09/16/17 1402    Subjective  Patient reports taping was "alright." It cued him for posture. No pain reported at start of session.    Patient is accompained by:  Family member    Pertinent History  Heart trouble    Limitations  Sitting;Walking;Standing    Currently in Pain?  No/denies         [facility] PT Assessment - 09/16/17 0001      Assessment   Medical Diagnosis  low back pain    Next MD Visit  one month    Prior Therapy  no                   [facility] Adult PT Treatment/Exercise - 09/16/17 0001      Exercises   Exercises  Lumbar      Lumbar Exercises: Aerobic   UBE (  Upper Arm Bike)  120 RPM 6 min (3 fwd, 3 bwd)    Nustep  level 4 x12 minutes with UE and draw in      Lumbar Exercises: Standing   Row  Strengthening;Both;20 reps    Row Limitations  Pink XTS    Shoulder Extension  Strengthening;Both;20 reps;Other (comment)    Shoulder Extension Limitations  pink XTS      Lumbar Exercises: Supine   Clam  20 reps;2 seconds    Bridge  Compliant;20 reps;2 seconds      Lumbar Exercises: Sidelying   Hip Abduction  Both;20 reps      Manual Therapy   Manual Therapy  Taping    Manual therapy comments  postural taping     Kinesiotex  Facilitate Muscle      Kinesiotix   Facilitate Muscle   postural taping                   PT Long Term Goals - 09/08/17 2039      PT LONG TERM GOAL #1   Title  Patient will be  independent in HEP    Time  4    Period  Weeks    Status  New      PT LONG TERM GOAL #2   Title  Patient will report ability to stand with less than 4/10 pain to perform work duties.    Time  4    Period  Weeks    Status  New      PT LONG TERM GOAL #3   Title  Patient will report ability to walk community distances with less than 4/10 pain to perform work duties.    Time  4    Period  Weeks    Status  New      PT LONG TERM GOAL #4   Title  Patient will demonstrate 5/5 bilateral hip abduction strength to improve stability during functional activities.     Time  4    Status  New            Plan - 09/16/17 1432    Clinical Impression Statement  Patient was able to tolerate treatment well with no reports of pain. Patient required verbal and tactile cuing for proper form with sidelying hip abduction. Patient's wife was educated on how to tape for posture. Patient's wife reported understanding. Patient educated that he may feel sore later today or tomorrow seconary to working muscles out. Patient reported understanding.    Clinical Presentation  Stable    Clinical Decision Making  Low    Rehab Potential  Good    PT Frequency  2x / week    PT Duration  4 weeks    PT Treatment/Interventions  ADLs/Self Care Home Management;Traction;Moist Heat;Cryotherapy;Electrical Stimulation;Therapeutic exercise;Patient/family education;Taping;Dry needling;Manual techniques;Passive range of motion    PT Next Visit Plan  nustep, core strengthening, taping for posture, modalities PRN for pain relief.    Consulted and Agree with Plan of Care  Patient       Patient will benefit from skilled therapeutic intervention in order to improve the following deficits and impairments:  Pain, Postural dysfunction, Difficulty walking  Visit Diagnosis: Acute bilateral low back pain, with sciatica presence unspecified  Pain in thoracic spine  Abnormal posture     Problem List Patient Active Problem List    Diagnosis Date Noted  . Benign neoplasm of cecum   . DOE (dyspnea on exertion) 07/19/2015  . Controlled type  2 diabetes mellitus without complication, without long-term current use of insulin ([place]) 05/07/2015  . Obstructive sleep apnea 04/26/2014  . Hypothyroidism 10/23/2013  . Acid reflux 08/30/2013  . Malaise and fatigue 08/30/2013  . 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 Upper airway cough syndrome 07/29/2011  . Hemoptysis 07/29/2011  . Heart block 07/28/2011  . Other abnormal glucose 07/20/2011  . AV block, Mobitz 1 07/14/2011  . Benign neoplasm of colon 07/08/2011  . Gastritis 07/08/2011  . Diverticulosis of colon (without mention of hemorrhage) 07/08/2011  . History of colonic polyps 12/23/2010  . Routine general medical examination at a health care facility 12/05/2010  . TOBACCO USE 03/10/2010  . SINUSITIS, CHRONIC 02/13/2010  . HIATAL HERNIA 12/22/2007  . Hyperlipidemia 12/30/2006  . Essential hypertension 12/30/2006  . CAD (coronary artery disease) 12/30/2006  . COPD GOLD III  12/30/2006  . GERD 12/30/2006   Gabriela Eves, PT, DPT 09/16/2017, 8:33 PM  [facility] [address] [place], [place], [address] Phone: [phone]   Fax:  [phone]  Name: Daniel Carey MRN: [id number] Date of Birth: 20-Aug-1950

## 2017-09-20 ENCOUNTER — Encounter: Payer: Self-pay | Admitting: Physical Therapy

## 2017-09-20 ENCOUNTER — Ambulatory Visit: Payer: BLUE CROSS/BLUE SHIELD | Attending: Specialist | Admitting: Physical Therapy

## 2017-09-20 DIAGNOSIS — M546 Pain in thoracic spine: Secondary | ICD-10-CM | POA: Insufficient documentation

## 2017-09-20 DIAGNOSIS — M545 Low back pain: Secondary | ICD-10-CM | POA: Diagnosis not present

## 2017-09-20 DIAGNOSIS — R293 Abnormal posture: Secondary | ICD-10-CM

## 2017-09-20 NOTE — Therapy (Signed)
[facility] [place], [place], [address] Phone: [phone]   Fax:  [phone]  Physical Therapy Treatment  Patient Details  Name: Daniel Carey MRN: [id number] Date of Birth: 1951/02/13 Referring Provider: Susa Day   Encounter Date: 09/20/2017  PT End of Session - 09/20/17 1349    Visit Number  3    Number of Visits  8    Date for PT Re-Evaluation  10/13/17    PT Start Time  2202    PT Stop Time  1432    PT Time Calculation (min)  47 min    Activity Tolerance  Patient tolerated treatment well    Behavior During Therapy  [facility] for tasks assessed/performed       Past Medical History:  Diagnosis Date  . 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 CAD (coronary artery disease)    moderate to severe distal CAD with large epicardial arteries having only mild to moderate irregularities. Last cath in Dec 2006; s/p cath with Promus DES in distal RCA and Promus DES in the mid vessel in an overlapping fashion  . Chest pain   . Complication of anesthesia    heart rate dropping with propofol last procedure  . COPD (chronic obstructive pulmonary disease) ([place])   . ED (erectile dysfunction)   . Esophageal stricture   . External hemorrhoids without mention of complication   . GERD (gastroesophageal reflux disease)   . Headache(784.0)   . Hiatal hernia   . HLD (hyperlipidemia)   . HTN (hypertension)   . Nephrolithiasis   . Personal history of colonic polyps 03/12/2010   TUBULAR ADENOMA  . Tobacco abuse     Past Surgical History:  Procedure Laterality Date  . ANGIOPLASTY     X3  . COLONOSCOPY N/A 02/16/2017   Procedure: COLONOSCOPY;  Surgeon: Jerene Bears, MD;  [facility];  Service: Gastroenterology;  Laterality: N/A;  . LEFT HEART CATH AND CORONARY ANGIOGRAPHY N/A 11/05/2016   Procedure: Left Heart Cath and Coronary Angiography;  Surgeon: Nelva Bush, MD;  Location: [place] CV LAB;   Service: Cardiovascular;  Laterality: N/A;  . PERCUTANEOUS CORONARY ROTOBLATOR INTERVENTION (PCI-R) N/A 06/30/2012   Procedure: PERCUTANEOUS CORONARY ROTOBLATOR INTERVENTION (PCI-R);  Surgeon: Burnell Blanks, MD;  Location: [facility] CATH LAB;  Service: Cardiovascular;  Laterality: N/A;  . VIDEO BRONCHOSCOPY  08/13/2011   Procedure: VIDEO BRONCHOSCOPY WITHOUT FLUORO;  Surgeon: Tanda Rockers, MD;  [facility];  Service: Cardiopulmonary;  Laterality: Bilateral;    There were no vitals filed for this visit.                    [facility] Adult PT Treatment/Exercise - 09/20/17 0001      Exercises   Exercises  Lumbar      Lumbar Exercises: Stretches   Quadruped Mid Back Stretch  --    Other Lumbar Stretch Exercise  Right sidelying on bolster to stretch L upper thoracic 2 minutes    Other Lumbar Stretch Exercise  seated Right sided lumbar/L thoracic stretch 3x30 seconds      Lumbar Exercises: Aerobic   UBE (Upper Arm Bike)  120 RPM 6 min (3 fwd, 3 bwd)    Nustep  Level 5 x15 minutes      Lumbar Exercises: Supine   Straight Leg Raise  20 reps;2 seconds      Lumbar Exercises: Sidelying   Other Sidelying Lumbar Exercises  Side crunches over bolster 2x10 each      Manual Therapy   Manual Therapy  Taping    Manual therapy comments  postural taping     Kinesiotex  Facilitate Muscle      Kinesiotix   Facilitate Muscle   postural taping                   PT Long Term Goals - 09/08/17 2039      PT LONG TERM GOAL #1   Title  Patient will be independent in HEP    Time  4    Period  Weeks    Status  New      PT LONG TERM GOAL #2   Title  Patient will report ability to stand with less than 4/10 pain to perform work duties.    Time  4    Period  Weeks    Status  New      PT LONG TERM GOAL #3   Title  Patient will report ability to walk community distances with less than 4/10 pain to perform work duties.    Time  4    Period  Weeks    Status  New       PT LONG TERM GOAL #4   Title  Patient will demonstrate 5/5 bilateral hip abduction strength to improve stability during functional activities.     Time  4    Status  New            Plan - 09/20/17 1439    Clinical Impression Statement  Patient was able to tolerate treatment well. Patient reported L sidelying crunch was more difficult than right. Patient was instructed to perform seated lumbar flexion stretch to right to lengthen L mid thoracic. Patient reported understanding. Patient instructed on proper technique of application of KT tape to improve carryover. Patient's wife reported understanding.    Clinical Presentation  Stable    Clinical Decision Making  Low    Rehab Potential  Good    PT Frequency  2x / week    PT Duration  4 weeks    PT Treatment/Interventions  ADLs/Self Care Home Management;Traction;Moist Heat;Cryotherapy;Electrical Stimulation;Therapeutic exercise;Patient/family education;Taping;Dry needling;Manual techniques;Passive range of motion    PT Next Visit Plan  nustep, core strengthening, taping for posture, modalities PRN for pain relief.    Consulted and Agree with Plan of Care  Patient       Patient will benefit from skilled therapeutic intervention in order to improve the following deficits and impairments:  Pain, Postural dysfunction, Difficulty walking  Visit Diagnosis: Acute bilateral low back pain, with sciatica presence unspecified  Pain in thoracic spine  Abnormal posture     Problem List Patient Active Problem List   Diagnosis Date Noted  . Benign neoplasm of cecum   . DOE (dyspnea on exertion) 07/19/2015  . Controlled type 2 diabetes mellitus without complication, without long-term current use of insulin ([place]) 05/07/2015  . Obstructive sleep apnea 04/26/2014  . Hypothyroidism 10/23/2013  . Acid reflux 08/30/2013  . Malaise and fatigue 08/30/2013  . 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 Upper airway cough syndrome 07/29/2011  .  Hemoptysis 07/29/2011  . Heart block 07/28/2011  . Other abnormal glucose 07/20/2011  . AV block, Mobitz 1 07/14/2011  . Benign neoplasm of colon 07/08/2011  . Gastritis 07/08/2011  . Diverticulosis of colon (without mention of hemorrhage) 07/08/2011  . History of colonic polyps 12/23/2010  . Routine general medical examination  at a health care facility 12/05/2010  . TOBACCO USE 03/10/2010  . SINUSITIS, CHRONIC 02/13/2010  . HIATAL HERNIA 12/22/2007  . Hyperlipidemia 12/30/2006  . Essential hypertension 12/30/2006  . CAD (coronary artery disease) 12/30/2006  . COPD GOLD III  12/30/2006  . GERD 12/30/2006   Gabriela Eves, PT, DPT 09/20/2017, 2:48 PM  [facility] [address] [place], [place], [address] Phone: [phone]   Fax:  [phone]  Name: Daniel Carey MRN: [id number] Date of Birth: 1950/12/30

## 2017-09-22 ENCOUNTER — Ambulatory Visit: Payer: BLUE CROSS/BLUE SHIELD | Admitting: Physical Therapy

## 2017-09-22 DIAGNOSIS — R293 Abnormal posture: Secondary | ICD-10-CM | POA: Diagnosis not present

## 2017-09-22 DIAGNOSIS — M545 Low back pain: Secondary | ICD-10-CM | POA: Diagnosis not present

## 2017-09-22 DIAGNOSIS — M546 Pain in thoracic spine: Secondary | ICD-10-CM | POA: Diagnosis not present

## 2017-09-22 DIAGNOSIS — I10 Essential (primary) hypertension: Secondary | ICD-10-CM | POA: Diagnosis not present

## 2017-09-22 DIAGNOSIS — E785 Hyperlipidemia, unspecified: Secondary | ICD-10-CM | POA: Diagnosis not present

## 2017-09-22 NOTE — Therapy (Signed)
[facility] [place], [place], [address] Phone: [phone]   Fax:  [phone]  Physical Therapy Treatment  Patient Details  Name: Daniel Carey MRN: [id number] Date of Birth: Jan 12, 1951 Referring Provider: Susa Day   Encounter Date: 09/22/2017  PT End of Session - 09/22/17 2010    Visit Number  4    Number of Visits  8    Date for PT Re-Evaluation  10/13/17    PT Start Time  3570    PT Stop Time  1432    PT Time Calculation (min)  47 min    Activity Tolerance  Patient tolerated treatment well    Behavior During Therapy  [facility] for tasks assessed/performed       Past Medical History:  Diagnosis Date  . 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 CAD (coronary artery disease)    moderate to severe distal CAD with large epicardial arteries having only mild to moderate irregularities. Last cath in Dec 2006; s/p cath with Promus DES in distal RCA and Promus DES in the mid vessel in an overlapping fashion  . Chest pain   . Complication of anesthesia    heart rate dropping with propofol last procedure  . COPD (chronic obstructive pulmonary disease) ([place])   . ED (erectile dysfunction)   . Esophageal stricture   . External hemorrhoids without mention of complication   . GERD (gastroesophageal reflux disease)   . Headache(784.0)   . Hiatal hernia   . HLD (hyperlipidemia)   . HTN (hypertension)   . Nephrolithiasis   . Personal history of colonic polyps 03/12/2010   TUBULAR ADENOMA  . Tobacco abuse     Past Surgical History:  Procedure Laterality Date  . ANGIOPLASTY     X3  . COLONOSCOPY N/A 02/16/2017   Procedure: COLONOSCOPY;  Surgeon: Jerene Bears, MD;  [facility]: [facility];  Service: Gastroenterology;  Laterality: N/A;  . LEFT HEART CATH AND CORONARY ANGIOGRAPHY N/A 11/05/2016   Procedure: Left Heart Cath and Coronary Angiography;  Surgeon: Nelva Bush, MD;  Location: [place] CV [facility];   Service: Cardiovascular;  Laterality: N/A;  . PERCUTANEOUS CORONARY ROTOBLATOR INTERVENTION (PCI-R) N/A 06/30/2012   Procedure: PERCUTANEOUS CORONARY ROTOBLATOR INTERVENTION (PCI-R);  Surgeon: Burnell Blanks, MD;  Location: [facility] CATH LAB;  Service: Cardiovascular;  Laterality: N/A;  . VIDEO BRONCHOSCOPY  08/13/2011   Procedure: VIDEO BRONCHOSCOPY WITHOUT FLUORO;  Surgeon: Tanda Rockers, MD;  [facility];  Service: Cardiopulmonary;  Laterality: Bilateral;    There were no vitals filed for this visit.  Subjective Assessment - 09/22/17 2010    Subjective  Patient reported feeling good    Patient is accompained by:  Family member    Pertinent History  Heart trouble    Limitations  Sitting;Walking;Standing    Currently in Pain?  No/denies         [facility] PT Assessment - 09/22/17 0001      Assessment   Medical Diagnosis  low back pain    Next MD Visit  one month    Prior Therapy  no                   [facility] Adult PT Treatment/Exercise - 09/22/17 0001      Exercises   Exercises  Lumbar      Lumbar Exercises: Aerobic   UBE (Upper Arm Bike)  120 RPM 6 min (3 fwd, 3 bwd)  Nustep  Level 5 x15 minutes      Lumbar Exercises: Standing   Row  Strengthening;Both;20 reps    Row Limitations  Pink XTS    Shoulder Extension  Strengthening;Both;20 reps;Other (comment)    Shoulder Extension Limitations  Pink XTS      Lumbar Exercises: Supine   Pelvic Tilt  20 reps 3 second hold    Bent Knee Raise  20 reps;2 seconds    Bridge with Ball Squeeze  Compliant;20 reps;2 seconds    Straight Leg Raises Limitations  h      Lumbar Exercises: Sidelying   Clam  Both;20 reps red theraband                  PT Long Term Goals - 09/08/17 2039      PT LONG TERM GOAL #1   Title  Patient will be independent in HEP    Time  4    Period  Weeks    Status  New      PT LONG TERM GOAL #2   Title  Patient will report ability to stand with less than 4/10 pain to  perform work duties.    Time  4    Period  Weeks    Status  New      PT LONG TERM GOAL #3   Title  Patient will report ability to walk community distances with less than 4/10 pain to perform work duties.    Time  4    Period  Weeks    Status  New      PT LONG TERM GOAL #4   Title  Patient will demonstrate 5/5 bilateral hip abduction strength to improve stability during functional activities.     Time  4    Status  New            Plan - 09/22/17 2011    Clinical Impression Statement  Patient was able to tolerate treatment well despite reports of R hip pain with SLR. Patient reported pain is not due to the low back and is unsure of the cause of pain. Patient instructed him to have his wife tape for posture for business trip. Patient reported understanding.     Clinical Presentation  Stable    Clinical Decision Making  Low    Rehab Potential  Good    PT Frequency  2x / week    PT Duration  4 weeks    PT Treatment/Interventions  ADLs/Self Care Home Management;Traction;Moist Heat;Cryotherapy;Electrical Stimulation;Therapeutic exercise;Patient/family education;Taping;Dry needling;Manual techniques;Passive range of motion    PT Next Visit Plan  nustep, core strengthening, taping for posture, modalities PRN for pain relief.    Consulted and Agree with Plan of Care  Patient       Patient will benefit from skilled therapeutic intervention in order to improve the following deficits and impairments:  Pain, Postural dysfunction, Difficulty walking  Visit Diagnosis: Acute bilateral low back pain, with sciatica presence unspecified  Pain in thoracic spine  Abnormal posture     Problem List Patient Active Problem List   Diagnosis Date Noted  . Benign neoplasm of cecum   . DOE (dyspnea on exertion) 07/19/2015  . Controlled type 2 diabetes mellitus without complication, without long-term current use of insulin ([place]) 05/07/2015  . Obstructive sleep apnea 04/26/2014  .  Hypothyroidism 10/23/2013  . Acid reflux 08/30/2013  . Malaise and fatigue 08/30/2013  . 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 Upper airway cough syndrome 07/29/2011  .  Hemoptysis 07/29/2011  . Heart block 07/28/2011  . Other abnormal glucose 07/20/2011  . AV block, Mobitz 1 07/14/2011  . Benign neoplasm of colon 07/08/2011  . Gastritis 07/08/2011  . Diverticulosis of colon (without mention of hemorrhage) 07/08/2011  . History of colonic polyps 12/23/2010  . Routine general medical examination at a health care facility 12/05/2010  . TOBACCO USE 03/10/2010  . SINUSITIS, CHRONIC 02/13/2010  . HIATAL HERNIA 12/22/2007  . Hyperlipidemia 12/30/2006  . Essential hypertension 12/30/2006  . CAD (coronary artery disease) 12/30/2006  . COPD GOLD III  12/30/2006  . GERD 12/30/2006   Gabriela Eves, PT, DPT 09/22/2017, 8:14 PM  [facility] [address] [place], [place], [address] Phone: [phone]   Fax:  [phone]  Name: Daniel Carey MRN: [id number] Date of Birth: 1950-11-06

## 2017-09-27 DIAGNOSIS — I10 Essential (primary) hypertension: Secondary | ICD-10-CM | POA: Diagnosis not present

## 2017-09-27 DIAGNOSIS — Z0000 Encounter for general adult medical examination without abnormal findings: Secondary | ICD-10-CM | POA: Diagnosis not present

## 2017-09-27 DIAGNOSIS — K219 Gastro-esophageal reflux disease without esophagitis: Secondary | ICD-10-CM | POA: Diagnosis not present

## 2017-09-27 DIAGNOSIS — I2510 Atherosclerotic heart disease of native coronary artery without angina pectoris: Secondary | ICD-10-CM | POA: Diagnosis not present

## 2017-09-27 DIAGNOSIS — E1159 Type 2 diabetes mellitus with other circulatory complications: Secondary | ICD-10-CM | POA: Diagnosis not present

## 2017-09-27 DIAGNOSIS — E1169 Type 2 diabetes mellitus with other specified complication: Secondary | ICD-10-CM | POA: Diagnosis not present

## 2017-09-27 DIAGNOSIS — E785 Hyperlipidemia, unspecified: Secondary | ICD-10-CM | POA: Diagnosis not present

## 2017-09-29 ENCOUNTER — Encounter: Payer: Self-pay | Admitting: Physical Therapy

## 2017-09-29 ENCOUNTER — Ambulatory Visit: Payer: BLUE CROSS/BLUE SHIELD | Admitting: Physical Therapy

## 2017-09-29 DIAGNOSIS — M545 Low back pain: Secondary | ICD-10-CM

## 2017-09-29 DIAGNOSIS — R293 Abnormal posture: Secondary | ICD-10-CM

## 2017-09-29 DIAGNOSIS — M546 Pain in thoracic spine: Secondary | ICD-10-CM

## 2017-09-29 NOTE — Therapy (Signed)
[facility] [place], [place], [address] Phone: [phone]   Fax:  [phone]  Physical Therapy Treatment  Patient Details  Name: Daniel Carey MRN: [id number] Date of Birth: 29-Oct-1950 Referring Provider: Susa Day   Encounter Date: 09/29/2017  PT End of Session - 09/29/17 1358    Visit Number  5    Number of Visits  8    Date for PT Re-Evaluation  10/13/17    PT Start Time  3235    PT Stop Time  1433    PT Time Calculation (min)  48 min    Activity Tolerance  Patient tolerated treatment well    Behavior During Therapy  [facility] for tasks assessed/performed       Past Medical History:  Diagnosis Date  . 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 CAD (coronary artery disease)    moderate to severe distal CAD with large epicardial arteries having only mild to moderate irregularities. Last cath in Dec 2006; s/p cath with Promus DES in distal RCA and Promus DES in the mid vessel in an overlapping fashion  . Chest pain   . Complication of anesthesia    heart rate dropping with propofol last procedure  . COPD (chronic obstructive pulmonary disease) ([place])   . ED (erectile dysfunction)   . Esophageal stricture   . External hemorrhoids without mention of complication   . GERD (gastroesophageal reflux disease)   . Headache(784.0)   . Hiatal hernia   . HLD (hyperlipidemia)   . HTN (hypertension)   . Nephrolithiasis   . Personal history of colonic polyps 03/12/2010   TUBULAR ADENOMA  . Tobacco abuse     Past Surgical History:  Procedure Laterality Date  . ANGIOPLASTY     X3  . COLONOSCOPY N/A 02/16/2017   Procedure: COLONOSCOPY;  Surgeon: Jerene Bears, MD;  [facility];  Service: Gastroenterology;  Laterality: N/A;  . LEFT HEART CATH AND CORONARY ANGIOGRAPHY N/A 11/05/2016   Procedure: Left Heart Cath and Coronary Angiography;  Surgeon: Nelva Bush, MD;  Location: [place] [facility];   Service: Cardiovascular;  Laterality: N/A;  . PERCUTANEOUS CORONARY ROTOBLATOR INTERVENTION (PCI-R) N/A 06/30/2012   Procedure: PERCUTANEOUS CORONARY ROTOBLATOR INTERVENTION (PCI-R);  Surgeon: Burnell Blanks, MD;  Location: [facility] CATH LAB;  Service: Cardiovascular;  Laterality: N/A;  . VIDEO BRONCHOSCOPY  08/13/2011   Procedure: VIDEO BRONCHOSCOPY WITHOUT FLUORO;  Surgeon: Tanda Rockers, MD;  [facility];  Service: Cardiopulmonary;  Laterality: Bilateral;    There were no vitals filed for this visit.      [facility] PT Assessment - 09/29/17 0001      Assessment   Medical Diagnosis  low back pain    Next MD Visit  one month    Prior Therapy  no      Strength   Right Hip Flexion  4+/5    Right Hip Extension  4+/5    Right Hip ABduction  4+/5    Left Hip Flexion  4+/5    Left Hip Extension  4+/5    Left Hip ABduction  4+/5    Right Knee Flexion  4+/5    Right Knee Extension  5/5    Left Knee Flexion  4+/5    Left Knee Extension  5/5                   [facility] Adult PT Treatment/Exercise - 09/29/17 0001  Exercises   Exercises  Lumbar      Lumbar Exercises: Aerobic   UBE (Upper Arm Bike)  120 RPM 8 min (4 fwd, 4 bwd)    Nustep  Level 5 x15 minutes      Lumbar Exercises: Standing   Row  Strengthening;Both;20 reps x30    Row Limitations  Pink XTS      Lumbar Exercises: Supine   Bent Knee Raise  20 reps 90/90 heel taps    Bridge  Compliant;15 reps;3 seconds      Lumbar Exercises: Prone   Straight Leg Raise  20 reps;2 seconds      Lumbar Exercises: Quadruped   [place]  20 reps                  PT Long Term Goals - 09/29/17 1359      PT LONG TERM GOAL #1   Title  Patient will be independent in HEP    Time  4    Period  Weeks    Status  Achieved      PT LONG TERM GOAL #2   Title  Patient will report ability to stand with less than 4/10 pain to perform work duties.    Time  4    Period  Weeks    Status  Achieved       PT LONG TERM GOAL #3   Title  Patient will report ability to walk community distances with less than 4/10 pain to perform work duties.    Time  4    Period  Weeks    Status  Achieved      PT LONG TERM GOAL #4   Title  Patient will demonstrate 5/5 bilateral hip abduction strength to improve stability during functional activities.     Time  4    Status  On-going            Plan - 09/29/17 1435    Clinical Impression Statement  Patient was able to tolerate treatment well with no reports of pain. Patient required verbal cuing and demonstration for proper form and technique for 90 90 bent knee raises. Patient has met LTG 1-3 but LTG #4 is still ongoing. Patient educated to continue exercises and rest during his drive to [place] for his next business trip.    Clinical Presentation  Stable    Clinical Decision Making  Low    Rehab Potential  Good    PT Frequency  2x / week    PT Duration  4 weeks    PT Treatment/Interventions  ADLs/Self Care Home Management;Traction;Moist Heat;Cryotherapy;Electrical Stimulation;Therapeutic exercise;Patient/family education;Taping;Dry needling;Manual techniques;Passive range of motion    PT Next Visit Plan  nustep, core strengthening, taping for posture, modalities PRN for pain relief.    Consulted and Agree with Plan of Care  Patient       Patient will benefit from skilled therapeutic intervention in order to improve the following deficits and impairments:  Pain, Postural dysfunction, Difficulty walking  Visit Diagnosis: Acute bilateral low back pain, with sciatica presence unspecified  Pain in thoracic spine  Abnormal posture     Problem List Patient Active Problem List   Diagnosis Date Noted  . Benign neoplasm of cecum   . DOE (dyspnea on exertion) 07/19/2015  . Controlled type 2 diabetes mellitus without complication, without long-term current use of insulin ([place]) 05/07/2015  . Obstructive sleep apnea 04/26/2014  . Hypothyroidism  10/23/2013  . Acid reflux 08/30/2013  .  Malaise and fatigue 08/30/2013  . 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 Upper airway cough syndrome 07/29/2011  . Hemoptysis 07/29/2011  . Heart block 07/28/2011  . Other abnormal glucose 07/20/2011  . AV block, Mobitz 1 07/14/2011  . Benign neoplasm of colon 07/08/2011  . Gastritis 07/08/2011  . Diverticulosis of colon (without mention of hemorrhage) 07/08/2011  . History of colonic polyps 12/23/2010  . Routine general medical examination at a health care facility 12/05/2010  . TOBACCO USE 03/10/2010  . SINUSITIS, CHRONIC 02/13/2010  . HIATAL HERNIA 12/22/2007  . Hyperlipidemia 12/30/2006  . Essential hypertension 12/30/2006  . CAD (coronary artery disease) 12/30/2006  . COPD GOLD III  12/30/2006  . GERD 12/30/2006    Gabriela Eves, PT, DPT 09/29/2017, 2:42 PM  [facility] [address] [place], [place], [address] Phone: [phone]   Fax:  [phone]  Name: Daniel Carey MRN: [id number] Date of Birth: [birth year]

## 2017-09-30 DIAGNOSIS — M5136 Other intervertebral disc degeneration, lumbar region: Secondary | ICD-10-CM | POA: Diagnosis not present

## 2017-09-30 DIAGNOSIS — I739 Peripheral vascular disease, unspecified: Secondary | ICD-10-CM | POA: Diagnosis not present

## 2017-09-30 DIAGNOSIS — M48061 Spinal stenosis, lumbar region without neurogenic claudication: Secondary | ICD-10-CM | POA: Diagnosis not present

## 2017-10-15 ENCOUNTER — Ambulatory Visit: Payer: BLUE CROSS/BLUE SHIELD | Admitting: Physical Therapy

## 2017-10-15 DIAGNOSIS — M545 Low back pain: Secondary | ICD-10-CM

## 2017-10-15 DIAGNOSIS — R293 Abnormal posture: Secondary | ICD-10-CM

## 2017-10-15 DIAGNOSIS — M546 Pain in thoracic spine: Secondary | ICD-10-CM | POA: Diagnosis not present

## 2017-10-15 NOTE — Therapy (Signed)
[facility] [place], [place], [address] Phone: [phone]   Fax:  [phone]  Physical Therapy Treatment  Patient Details  Name: CURRIE DENNIN MRN: [id number] Date of Birth: [birth year] Referring Provider: Susa Day   Encounter Date: 10/15/2017  PT End of Session - 10/15/17 1053    Visit Number  6    Number of Visits  8    Date for PT Re-Evaluation  10/13/17    PT Start Time  1030    PT Stop Time  1112    PT Time Calculation (min)  42 min    Activity Tolerance  Patient tolerated treatment well    Behavior During Therapy  [facility] for tasks assessed/performed       Past Medical History:  Diagnosis Date  . 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 CAD (coronary artery disease)    moderate to severe distal CAD with large epicardial arteries having only mild to moderate irregularities. Last cath in Dec 2006; s/p cath with Promus DES in distal RCA and Promus DES in the mid vessel in an overlapping fashion  . Chest pain   . Complication of anesthesia    heart rate dropping with propofol last procedure  . COPD (chronic obstructive pulmonary disease) ([place])   . ED (erectile dysfunction)   . Esophageal stricture   . External hemorrhoids without mention of complication   . GERD (gastroesophageal reflux disease)   . Headache(784.0)   . Hiatal hernia   . HLD (hyperlipidemia)   . HTN (hypertension)   . Nephrolithiasis   . Personal history of colonic polyps 03/12/2010   TUBULAR ADENOMA  . Tobacco abuse     Past Surgical History:  Procedure Laterality Date  . ANGIOPLASTY     X3  . COLONOSCOPY N/A 02/16/2017   Procedure: COLONOSCOPY;  Surgeon: Jerene Bears, MD;  [facility];  Service: Gastroenterology;  Laterality: N/A;  . LEFT HEART CATH AND CORONARY ANGIOGRAPHY N/A 11/05/2016   Procedure: Left Heart Cath and Coronary Angiography;  Surgeon: Nelva Bush, MD;  Location: [place] CV LAB;   Service: Cardiovascular;  Laterality: N/A;  . PERCUTANEOUS CORONARY ROTOBLATOR INTERVENTION (PCI-R) N/A 06/30/2012   Procedure: PERCUTANEOUS CORONARY ROTOBLATOR INTERVENTION (PCI-R);  Surgeon: Burnell Blanks, MD;  Location: [facility] CATH LAB;  Service: Cardiovascular;  Laterality: N/A;  . VIDEO BRONCHOSCOPY  08/13/2011   Procedure: VIDEO BRONCHOSCOPY WITHOUT FLUORO;  Surgeon: Tanda Rockers, MD;  [facility];  Service: Cardiopulmonary;  Laterality: Bilateral;    There were no vitals filed for this visit.  Subjective Assessment - 10/15/17 1111    Subjective  Patient reported feeling no pain over the past few weeks.    Pertinent History  Heart trouble    Limitations  Sitting;Walking;Standing    Currently in Pain?  No/denies         [facility] PT Assessment - 10/15/17 0001      Assessment   Medical Diagnosis  low back pain                   [facility] Adult PT Treatment/Exercise - 10/15/17 0001      Exercises   Exercises  Lumbar      Lumbar Exercises: Aerobic   Nustep  Level 5 x16 minutes      Lumbar Exercises: Supine   Bridge  Compliant;10 reps;20 reps x30    Straight Leg Raise  20 reps with ER  Lumbar Exercises: Sidelying   Clam  Both;20 reps red theraband                  PT Long Term Goals - 10/15/17 1115      PT LONG TERM GOAL #1   Title  Patient will be independent in HEP    Time  4    Period  Weeks    Status  Achieved      PT LONG TERM GOAL #2   Title  Patient will report ability to stand with less than 4/10 pain to perform work duties.    Time  4    Period  Weeks    Status  Achieved      PT LONG TERM GOAL #3   Title  Patient will report ability to walk community distances with less than 4/10 pain to perform work duties.    Time  4    Period  Weeks    Status  Achieved      PT LONG TERM GOAL #4   Title  Patient will demonstrate 5/5 bilateral hip abduction strength to improve stability during functional activities.     Time   4    Period  Weeks    Status  Not Met 4+/5 bilateral hip abduction MMT            Plan - 10/15/17 1112    Clinical Impression Statement  Patient was able to tolerate treatment well. Patient was able to perform exercises with good form and no verbal cuing required. Patient reported PT has improved his pain significantly and would like to today be his last visit due to work constraints in the next few weeks. Patient was provided with HEP conisisting of rows, extensions, bridges, SLR, and clam shells. Patient reported understanding and agreement. Pt discharged with goals partially met.     Clinical Presentation  Stable    Clinical Decision Making  Low    Rehab Potential  Good    PT Frequency  2x / week    PT Duration  4 weeks    PT Treatment/Interventions  ADLs/Self Care Home Management;Traction;Moist Heat;Cryotherapy;Electrical Stimulation;Therapeutic exercise;Patient/family education;Taping;Dry needling;Manual techniques;Passive range of motion    PT Next Visit Plan  DC    Consulted and Agree with Plan of Care  Patient       Patient will benefit from skilled therapeutic intervention in order to improve the following deficits and impairments:  Pain, Postural dysfunction, Difficulty walking  Visit Diagnosis: Acute bilateral low back pain, with sciatica presence unspecified  Pain in thoracic spine  Abnormal posture     Problem List Patient Active Problem List   Diagnosis Date Noted  . Benign neoplasm of cecum   . DOE (dyspnea on exertion) 07/19/2015  . Controlled type 2 diabetes mellitus without complication, without long-term current use of insulin ([place]) 05/07/2015  . Obstructive sleep apnea 04/26/2014  . Hypothyroidism 10/23/2013  . Acid reflux 08/30/2013  . Malaise and fatigue 08/30/2013  . 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 Upper airway cough syndrome 07/29/2011  . Hemoptysis 07/29/2011  . Heart block 07/28/2011  . Other abnormal glucose 07/20/2011  . AV block,  Mobitz 1 07/14/2011  . Benign neoplasm of colon 07/08/2011  . Gastritis 07/08/2011  . Diverticulosis of colon (without mention of hemorrhage) 07/08/2011  . History of colonic polyps 12/23/2010  . Routine general medical examination at a health care facility 12/05/2010  . TOBACCO USE 03/10/2010  . SINUSITIS, CHRONIC 02/13/2010  .  HIATAL HERNIA 12/22/2007  . Hyperlipidemia 12/30/2006  . Essential hypertension 12/30/2006  . CAD (coronary artery disease) 12/30/2006  . COPD GOLD III  12/30/2006  . GERD 12/30/2006     PHYSICAL THERAPY DISCHARGE SUMMARY  Visits from Start of Care: 6  Current functional level related to goals / functional outcomes: See above   Remaining deficits: See goals   Education / Equipment: HEP Plan: Patient agrees to discharge.  Patient goals were partially met. Patient is being discharged due to being pleased with the current functional level.  ?????     [facility] 10/15/2017, 11:21 AM  [facility] [address] [place], [place], [address] Phone: [phone]   Fax:  [phone]  Name: LANE ELAND MRN: [id number] Date of Birth: 11-Jul-1950

## 2017-10-20 ENCOUNTER — Other Ambulatory Visit: Payer: Self-pay | Admitting: Cardiovascular Disease

## 2017-10-22 ENCOUNTER — Other Ambulatory Visit: Payer: Self-pay | Admitting: Cardiovascular Disease

## 2017-11-05 ENCOUNTER — Other Ambulatory Visit: Payer: Self-pay | Admitting: Internal Medicine

## 2017-11-05 NOTE — Telephone Encounter (Signed)
Please review for refill. Thanks!  

## 2017-11-19 ENCOUNTER — Other Ambulatory Visit: Payer: Self-pay | Admitting: Cardiovascular Disease

## 2017-12-23 ENCOUNTER — Encounter

## 2017-12-23 ENCOUNTER — Encounter: Payer: Self-pay | Admitting: Cardiovascular Disease

## 2017-12-23 ENCOUNTER — Ambulatory Visit (INDEPENDENT_AMBULATORY_CARE_PROVIDER_SITE_OTHER): Payer: BLUE CROSS/BLUE SHIELD | Admitting: Cardiovascular Disease

## 2017-12-23 VITALS — BP 140/82 | HR 62 | Ht 70.0 in | Wt 196.0 lb

## 2017-12-23 DIAGNOSIS — I2510 Atherosclerotic heart disease of native coronary artery without angina pectoris: Secondary | ICD-10-CM

## 2017-12-23 DIAGNOSIS — E782 Mixed hyperlipidemia: Secondary | ICD-10-CM

## 2017-12-23 NOTE — Progress Notes (Signed)
[name] Date of Birth  1951-03-27       [facility] [address], [place], [place]  [address]   [place], [place]  [address] [phone]     [phone]   Fax  [phone]    Fax [phone]  Problem List: 1. CAD - 2.5 x 38 mm Promus Premier DES in the distal RCA and a 2.75 x 38 mm Promus Premier DES in the mid vessel in an overlapping fashion. The distal stent was post-dilated with a 2.5 x 20 mm [place] balloon. The proximal portion of the stent was post-dilated with a 3.0 x 20 mm [place] balloon ( June 30, 2012)  2. Bradycardia after getting propafol for endoscopy. 3. Obstructive sleep apnea:     History of Present Illness:  Daniel Carey is feeling well today.  He's been getting some exercise. He does yard work and walks on the treadmill occasionally. He had an episode of bradycardia after getting IV protocol for an endoscopy procedure. He was seen by [name]. His metoprolol dose was decreased. He has also had a bronchoscopy with [name]. He does not have any evidence of COPD.  Both he and his wife have stop smoking.  ( Aug 20, 2011)  Feb. 28, 2014:  He continues to have increasing chest pain.  He has not been smoking.  The pains are associated with exertion, last 5-10 minutes, resolve with 1 SL NTG.   He's had a mildly elevated TSH for the past year or so and has had some fatigue.    his TSH level 6.8.      His medical doctor started him on Synthroid. He gradually has titrated up to 0.1 mg a day. Since that time he's had increasing episodes of chest pain.  Aug 29, 2012:  Daniel Carey saw him in February we performed a cardiac catheterization because of increasing chest pain. He was found to have sequential stenosis in his mid right coronary artery and had PCI of his right coronary artery ( 2.5 x 38 mm Promus Premier DES in the distal RCA and a 2.75 x 38 mm Promus Premier DES in the mid vessel in an overlapping fashion. The  distal sten was post-dilated with a 2.5 x 20 mm [place] balloon. The proximal portion of the stent was post-dilated with a 3.0 x 20 mm  balloon)  He and his wife are concerned about whether or not the Plavix is active.  His generic plavix manufacturer has changed.   He would like to be tested.   October 14, 2012:  Daniel Carey has been having some occasional CP.  He has had some episodes of dyspnea while climbing up a hill playing golf.  This past Monday night, he had some chest pain - possible due to indigestions.  these  He had lost his NTG - he has refilled it now.    Nov. 10, 2014:  Daniel Carey is doing well.  He is working too much according to wife. he's had a few CP but not severe enough to take NTG.  Still has some dyspnea with climbing steps.   He had stenting in march 2014.  October 23, 2013:  Daniel Carey is doing well.   Playing golf.  Working in the yard.  Not exercising as much.   He has lost about 6 lbs.  He had labs drawn today.     Jan. 7, 2016:  He has been  waking up in the middle of the night and feels like he cant catch his  Breath. Still working out - on the treadmill twice this week and has done well.   Sept. 1, 2016:  Has had some short of breath. Does not get any exercise.  His CP is much better since starting CPAP .  Is having bloating  - thinks its due to the lipitor   July 19, 2015: Has had some shortness of breath.   With exertion . Happens occasionally but seem to be getting more frequently .  BP is elevated today  Has cut back on his salt .   Sept. 21, 2017:  Daniel Carey is seen today for follow up visit for his CAD Occasionally short of breath . Comes and goes .  BP is a little elevated today Has occasional CP  Able to do all that he wants to do   November 04, 2016: Still having some chest pain Not exertional ,  Seems very similar to his previous episodes of angina prior to stenting .   July 31 , 2108  Daniel Carey is seen today for follow up of a recent cath.  He was found to  have diffuse moderate - severe CAD - especially in the LAD  Was started on Imdur - is having a headache with that. Angina seems to be slightly  better.  He still has marked DOE - wife is very concerned about this .   is going to see pulmonary soon .  Sept. 24, 2018:  Daniel Carey is doing very well. His chest pain seemed to be better. Getting some exercise   A little bit more salt than usual over the weekend. His diastolic blood pressure today is 90.  Nov. 8, 2018:    Daniel Carey is seen today  Has worsening dyspnea.   Chest tightness Also had a bladder infection  - burning with urination. ,   Fevers, chills.   Temp of 99.8 Still having fevers and chills  His dyspnea seems to be worse with his chills.  No blood in urine,   extremy dysuria and frequency + cough , no hemoptysis, No pleuretic CP  No real angina   April 08, 2017:  Daniel Carey is seen back for follow up visit Has urosepsis when I last saw him ( UTI with bacteremia )  Treated with ABx Now is having dysuria .   Is seeing urologist tomorrow .   No serious angina .   December 23, 2017:  Daniel Carey is seen today for follow-up of his coronary artery disease. Had a colonoscopy  Complicated by a E.coli UTI when I last saw him  Rare episodes of CP   Current Outpatient Medications on File Prior to Visit  Medication Sig Dispense Refill  . albuterol (PROAIR HFA) 108 (90 Base) MCG/ACT inhaler NHALE 2 PUFFS INTO THE LUNGS EVERY 6 (SIX) HOURS AS NEEDED FOR WHEEZING OR SHORTNESS OF BREATH. 8.5 Inhaler 0  . aspirin EC 81 MG tablet Take 81 mg by mouth daily.    . Choline Fenofibrate (FENOFIBRIC ACID) 135 MG CPDR Take 1 tablet by mouth daily. 30 capsule 5  . clopidogrel (PLAVIX) 75 MG tablet TAKE 1 TABLET BY MOUTH EVERY DAY 90 tablet 2  . famotidine (PEPCID) 20 MG tablet Take 20 mg by mouth daily as needed for heartburn.    . hydrochlorothiazide (HYDRODIURIL) 25 MG tablet TAKE 0.5 TABLETS (12.5 MG TOTAL) BY MOUTH DAILY. 45 tablet 3  . isosorbide  mononitrate (IMDUR) 30  MG 24 hr tablet Take 1 tablet (30 mg total) by mouth daily. Please keep upcoming appt in September for future refills. Thank you 30 tablet 1  . metoprolol tartrate (LOPRESSOR) 25 MG tablet TAKE 1 TABLET BY MOUTH TWICE A DAY 180 tablet 2  . naproxen sodium (ANAPROX) 220 MG tablet Take 220 mg by mouth 2 (two) times daily as needed (for headache).     . nitroGLYCERIN (NITROSTAT) 0.4 MG SL tablet Place 1 tablet (0.4 mg total) under the tongue every 5 (five) minutes as needed for chest pain. 25 tablet 4  . RABEprazole (ACIPHEX) 20 MG tablet Take 20 mg by mouth daily.    . rosuvastatin (CRESTOR) 20 MG tablet TAKE 1 TABLET BY MOUTH EVERY DAY 30 tablet 1   No current facility-administered medications on file prior to visit.     Allergies  Allergen Reactions  . Lisinopril Cough and Rash  . Amlodipine Besylate Other (See Comments)    Headache, weakness, dizziness  . Propofol Other (See Comments)    Heart rate dropped    Past Medical History:  Diagnosis Date  . 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 CAD (coronary artery disease)    moderate to severe distal CAD with large epicardial arteries having only mild to moderate irregularities. Last cath in Dec 2006; s/p cath with Promus DES in distal RCA and Promus DES in the mid vessel in an overlapping fashion  . Chest pain   . Complication of anesthesia    heart rate dropping with propofol last procedure  . COPD (chronic obstructive pulmonary disease) ([place])   . ED (erectile dysfunction)   . Esophageal stricture   . External hemorrhoids without mention of complication   . GERD (gastroesophageal reflux disease)   . Headache(784.0)   . Hiatal hernia   . HLD (hyperlipidemia)   . HTN (hypertension)   . Nephrolithiasis   . Personal history of colonic polyps 03/12/2010   TUBULAR ADENOMA  . Tobacco abuse     Past Surgical History:  Procedure Laterality Date  . ANGIOPLASTY     X3  . COLONOSCOPY N/A 02/16/2017    Procedure: COLONOSCOPY;  Surgeon: Jerene Bears, MD;  Location: [facility];  Service: Gastroenterology;  Laterality: N/A;  . LEFT HEART CATH AND CORONARY ANGIOGRAPHY N/A 11/05/2016   Procedure: Left Heart Cath and Coronary Angiography;  Surgeon: Nelva Bush, MD;  Location: [facility];  Service: Cardiovascular;  Laterality: N/A;  . PERCUTANEOUS CORONARY ROTOBLATOR INTERVENTION (PCI-R) N/A 06/30/2012   Procedure: PERCUTANEOUS CORONARY ROTOBLATOR INTERVENTION (PCI-R);  Surgeon: Burnell Blanks, MD;  Location: [facility] CATH LAB;  Service: Cardiovascular;  Laterality: N/A;  . VIDEO BRONCHOSCOPY  08/13/2011   Procedure: VIDEO BRONCHOSCOPY WITHOUT FLUORO;  Surgeon: Tanda Rockers, MD;  Location: [facility];  Service: Cardiopulmonary;  Laterality: Bilateral;    Social History   Tobacco Use  Smoking Status Former Smoker  . Packs/day: 0.50  . Years: 40.00  . Pack years: 20.00  . Types: Cigarettes  . Last attempt to quit: 08/25/2011  . Years since quitting: 6.3  Smokeless Tobacco Never Used    Social History   Substance and Sexual Activity  Alcohol Use Yes   Comment: rare    Family History  Problem Relation Age of Onset  . Heart disease Father        hx of MI  . Diabetes Mother        and brother   .  Kidney failure Mother   . Breast cancer Sister   . Colon cancer Neg Hx   . Stomach cancer Neg Hx     Reviw of Systems:  Reviewed in the HPI.  All other systems are negative.   Physical Exam: Blood pressure 140/82, pulse 62, height 5\' 10"  (1.778 m), weight 196 lb (88.9 kg).  GEN:  Well nourished, well developed in no acute distress HEENT: Normal NECK: No JVD; No carotid bruits LYMPHATICS: No lymphadenopathy CARDIAC: RRR , no murmurs, rubs, gallops RESPIRATORY:  Clear to auscultation without rales, wheezing or rhonchi  ABDOMEN: Soft, non-tender, non-distended MUSCULOSKELETAL:  No edema; No deformity  SKIN: Warm and dry NEUROLOGIC:  Alert and oriented x 3   ECG:  December 23, 2017: Normal sinus rhythm at 62 beats a minute.  First-degree AV block.  Poor R wave progression-cannot rule out anterior wall myocardial infarction.  Age is from previous EKG.   Assessment / Plan:    1. CAD  -   Doing well.   No angina continue current medications.  2. Hyperlipidemia:     Stable.  Check labs today.  3.  Obstructive sleep apnea:       Managed by primary MD      Mertie Moores, MD  12/23/2017 3:51 PM    [address],  [place], [place]  [address] Pager [phone] Phone: [phone]; Fax: [phone]

## 2017-12-23 NOTE — Patient Instructions (Signed)
Medication Instructions:  Your physician recommends that you continue on your current medications as directed. Please refer to the Current Medication list given to you today.   Labwork: TODAY - cholesterol, liver panel, basic metabolic panel   Testing/Procedures: None Ordered   Follow-Up: Your physician wants you to follow-up in: 6 months with Robbie Lis, PA or another member of Dr. Elmarie Shiley team.  Dennis Bast will receive a reminder letter in the mail two months in advance. If you don't receive a letter, please call our office to schedule the follow-up appointment.   If you need a refill on your cardiac medications before your next appointment, please call your pharmacy.   Thank you for choosing [facility]! Christen Bame, RN [phone]

## 2017-12-24 ENCOUNTER — Other Ambulatory Visit: Payer: Self-pay | Admitting: Cardiovascular Disease

## 2017-12-24 LAB — BASIC METABOLIC PANEL
BUN/Creatinine Ratio: 14 (ref 10–24)
BUN: 16 mg/dL (ref 8–27)
CO2: 26 mmol/L (ref 20–29)
Calcium: 10 mg/dL (ref 8.6–10.2)
Chloride: 97 mmol/L (ref 96–106)
Creatinine, Ser: 1.15 mg/dL (ref 0.76–1.27)
GFR calc Af Amer: 76 mL/min/{1.73_m2} (ref >59)
GFR calc non Af Amer: 66 mL/min/{1.73_m2} (ref >59)
Glucose: 99 mg/dL (ref 65–99)
Potassium: 5.1 mmol/L (ref 3.5–5.2)
Sodium: 139 mmol/L (ref 134–144)

## 2017-12-24 LAB — LIPID PANEL
Chol/HDL Ratio: 3.2 ratio (ref 0.0–5.0)
Cholesterol, Total: 155 mg/dL (ref 100–199)
HDL: 49 mg/dL (ref >39)
LDL Calculated: 75 mg/dL (ref 0–99)
Triglycerides: 155 mg/dL — ABNORMAL HIGH (ref 0–149)
VLDL Cholesterol Cal: 31 mg/dL (ref 5–40)

## 2017-12-24 LAB — HEPATIC FUNCTION PANEL
ALT: 18 IU/L (ref 0–44)
AST: 19 IU/L (ref 0–40)
Albumin: 4.3 g/dL (ref 3.6–4.8)
Alkaline Phosphatase: 63 IU/L (ref 39–117)
Bilirubin Total: 0.3 mg/dL (ref 0.0–1.2)
Bilirubin, Direct: 0.11 mg/dL (ref 0.00–0.40)
Total Protein: 7.1 g/dL (ref 6.0–8.5)

## 2018-01-07 DIAGNOSIS — E1159 Type 2 diabetes mellitus with other circulatory complications: Secondary | ICD-10-CM | POA: Diagnosis not present

## 2018-01-07 DIAGNOSIS — R05 Cough: Secondary | ICD-10-CM | POA: Diagnosis not present

## 2018-01-07 DIAGNOSIS — I10 Essential (primary) hypertension: Secondary | ICD-10-CM | POA: Diagnosis not present

## 2018-01-09 ENCOUNTER — Other Ambulatory Visit: Payer: Self-pay | Admitting: Internal Medicine

## 2018-01-10 NOTE — Telephone Encounter (Signed)
Refill Request.  

## 2018-01-17 DIAGNOSIS — J209 Acute bronchitis, unspecified: Secondary | ICD-10-CM | POA: Diagnosis not present

## 2018-01-17 DIAGNOSIS — I10 Essential (primary) hypertension: Secondary | ICD-10-CM | POA: Diagnosis not present

## 2018-01-17 DIAGNOSIS — E1159 Type 2 diabetes mellitus with other circulatory complications: Secondary | ICD-10-CM | POA: Diagnosis not present

## 2018-01-31 ENCOUNTER — Ambulatory Visit (INDEPENDENT_AMBULATORY_CARE_PROVIDER_SITE_OTHER): Payer: BLUE CROSS/BLUE SHIELD | Admitting: Internal Medicine

## 2018-01-31 ENCOUNTER — Encounter: Payer: Self-pay | Admitting: Internal Medicine

## 2018-01-31 VITALS — BP 136/90 | HR 64 | Ht 70.0 in | Wt 192.0 lb

## 2018-01-31 DIAGNOSIS — E039 Hypothyroidism, unspecified: Secondary | ICD-10-CM | POA: Diagnosis not present

## 2018-01-31 DIAGNOSIS — E119 Type 2 diabetes mellitus without complications: Secondary | ICD-10-CM | POA: Diagnosis not present

## 2018-01-31 DIAGNOSIS — Z23 Encounter for immunization: Secondary | ICD-10-CM | POA: Diagnosis not present

## 2018-01-31 DIAGNOSIS — E663 Overweight: Secondary | ICD-10-CM | POA: Insufficient documentation

## 2018-01-31 NOTE — Progress Notes (Signed)
Patient ID: Daniel Carey, male   DOB: [birth year], 67 y.o.   MRN: [id number]   HPI  Daniel Carey is a 67 y.o.-year-old male, returning for f/u for hypothyroidism, dx 2012 and DM2, diet-controlled, non-insulin-dependent, with complications (CAD). Last visit 6 mo ago.  Pt. has been dx with hypothyroidism in 2012 >> he was on levothyroxine up to 200 mcg in 2016 >> but did not feel well so he stopped.  Since then, his TFTs fluctuated, but in the last 2 years he has been in the normal range or only slightly above.  The free thyroid hormones have always been normal.  We are following him without medicines.  Reviewed previous TFTs: Lab Results  Component Value Date   TSH 4.50 08/02/2017   TSH 4.89 (H) 12/08/2016   TSH 4.13 03/06/2016   TSH 9.84 (H) 09/05/2015   TSH 0.23 (L) 05/07/2015   TSH 2.49 08/02/2014   TSH 4.695 (H) 10/26/2013   TSH 1.56 06/08/2012   TSH 6.83 (H) 07/20/2011   TSH 5.61 (H) 07/14/2011   FREET4 0.65 08/02/2017   FREET4 0.83 12/08/2016   FREET4 0.67 03/06/2016   FREET4 0.66 09/05/2015   FREET4 1.25 05/07/2015   FREET4 0.79 08/02/2014    Lab Results  Component Value Date   T3FREE 2.6 08/02/2017   T3FREE 3.3 12/08/2016   T3FREE 3.1 03/06/2016   T3FREE 3.2 09/05/2015   T3FREE 3.9 05/07/2015   T3FREE 2.9 08/02/2014   T3FREE 2.8 07/20/2011   Pt denies: - feeling nodules in neck - hoarseness - dysphagia - choking - SOB with lying down  He sees Dr Daniel Carey >> had angioplasy 3 times in the past, 2 stents in last year. He had an AMI 1996 (mild). He has a Mobitz 1 AVB. Had a catheterization in 10/2016 >> partial blockages.  He is on Crestor.  Also on Daniel Carey.  DM2:  Since last visit, he lost several pounds by eliminating sweets and starches.  Reviewed latest HbA1c levels: Lab Results  Component Value Date   HGBA1C 6.4 08/02/2017   HGBA1C 6.3 12/08/2016   HGBA1C 7.1 (H) 09/05/2015  07/03/2016: HbA1c 6.8%  He checks his sugars seldom: 85-119,  129  Meter: ReliOn  No history of CKD. Last BUN/Cr: Lab Results  Component Value Date   BUN 16 12/23/2017   BUN 20 04/08/2017   BUN 16 01/11/2017   CREATININE 1.15 12/23/2017   CREATININE 1.36 (H) 04/08/2017   CREATININE 0.94 01/11/2017  He has a history of high potassium, therefore, he is off ACE inhibitor/ARB's. Reviewed potassium levels: Lab Results  Component Value Date   K 5.1 12/23/2017   K 5.2 04/08/2017   K 5.2 01/11/2017   K 5.4 (H) 11/17/2016   K 4.8 11/05/2016   + HL; latest lipid panel reviewed: Lab Results  Component Value Date   CHOL 155 12/23/2017   HDL 49 12/23/2017   LDLCALC 75 12/23/2017   LDLDIRECT 107.0 02/27/2013   TRIG 155 (H) 12/23/2017   CHOLHDL 3.2 12/23/2017  On Crestor and Fenofibrtae Last eye exam in 03/2017:No DR. no glaucoma, + incipient cataracts.  He stays active playing golf.  He has OSA >> wears a C-pap.   He had a colonoscopy 02/2017 >> E coli sepsis.  ROS: Constitutional: + Intentional weight gain/no weight loss, no fatigue, no subjective hyperthermia, no subjective hypothermia Eyes: no blurry vision, no xerophthalmia ENT: no sore throat, no nodules palpated in throat, no dysphagia, no odynophagia, no hoarseness Cardiovascular: no  CP/no SOB/no palpitations/no leg swelling Respiratory: no cough/no SOB/no wheezing Gastrointestinal: no N/no V/no D/no C/no acid reflux Musculoskeletal: no muscle aches/no joint aches Skin: no rashes, no hair loss Neurological: no tremors/no numbness/no tingling/no dizziness  I reviewed pt's medications, allergies, PMH, social hx, family hx, and changes were documented in the history of present illness. Otherwise, unchanged from my initial visit note.   Past Medical History:  Diagnosis Date  . 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 CAD (coronary artery disease)    moderate to severe distal CAD with large epicardial arteries having only mild to moderate irregularities. Last cath in Dec  2006; s/p cath with Promus DES in distal RCA and Promus DES in the mid vessel in an overlapping fashion  . Chest pain   . Complication of anesthesia    heart rate dropping with propofol last procedure  . COPD (chronic obstructive pulmonary disease) ([place])   . ED (erectile dysfunction)   . Esophageal stricture   . External hemorrhoids without mention of complication   . GERD (gastroesophageal reflux disease)   . Headache(784.0)   . Hiatal hernia   . HLD (hyperlipidemia)   . HTN (hypertension)   . Nephrolithiasis   . Personal history of colonic polyps 03/12/2010   TUBULAR ADENOMA  . Tobacco abuse    Past Surgical History:  Procedure Laterality Date  . ANGIOPLASTY     X3  . COLONOSCOPY N/A 02/16/2017   Procedure: COLONOSCOPY;  Surgeon: Jerene Bears, MD;  Location: [facility];  Service: Gastroenterology;  Laterality: N/A;  . LEFT HEART CATH AND CORONARY ANGIOGRAPHY N/A 11/05/2016   Procedure: Left Heart Cath and Coronary Angiography;  Surgeon: Nelva Bush, MD;  Location: [facility];  Service: Cardiovascular;  Laterality: N/A;  . PERCUTANEOUS CORONARY ROTOBLATOR INTERVENTION (PCI-R) N/A 06/30/2012   Procedure: PERCUTANEOUS CORONARY ROTOBLATOR INTERVENTION (PCI-R);  Surgeon: Burnell Blanks, MD;  Location: [facility] CATH LAB;  Service: Cardiovascular;  Laterality: N/A;  . VIDEO BRONCHOSCOPY  08/13/2011   Procedure: VIDEO BRONCHOSCOPY WITHOUT FLUORO;  Surgeon: Tanda Rockers, MD;  Location: [facility];  Service: Cardiopulmonary;  Laterality: Bilateral;   History   Social History  . 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 Smoking status: Former Smoker -- 0.50 packs/day for 40 years    Types: Cigarettes    Quit date: 08/25/2011  . Smokeless tobacco: Never Used  . Alcohol Use: 0.0 oz/week     Comment: rare  . Drug Use: No   Current Outpatient Medications  on File Prior to Visit  Medication Sig Dispense Refill  . albuterol (PROAIR HFA) 108 (90 Base) MCG/ACT inhaler NHALE 2 PUFFS INTO THE LUNGS EVERY 6 (SIX) HOURS AS NEEDED FOR WHEEZING OR SHORTNESS OF BREATH. 8.5 Inhaler 0  . aspirin EC 81 MG tablet Take 81 mg by mouth daily.    . Choline Fenofibrate (FENOFIBRIC ACID) 135 MG CPDR Take 1 tablet by mouth daily. 30 capsule 5  . clopidogrel (PLAVIX) 75 MG tablet TAKE 1 TABLET BY MOUTH EVERY DAY 90 tablet 2  . famotidine (PEPCID) 20 MG tablet Take 20 mg by mouth daily as needed for heartburn.    . hydrochlorothiazide (HYDRODIURIL) 25 MG tablet TAKE 0.5 TABLETS (12.5 MG TOTAL) BY MOUTH DAILY. 45 tablet 3  . isosorbide mononitrate (IMDUR) 30  MG 24 hr tablet TAKE 1 TABLET (30 MG TOTAL) DAILY. PLEASE KEEP UPCOMING APPT IN SEPTEMBER FOR FUTURE REFILLS 30 tablet 1  . metoprolol tartrate (LOPRESSOR) 25 MG tablet TAKE 1 TABLET BY MOUTH TWICE A DAY 180 tablet 2  . naproxen sodium (ANAPROX) 220 MG tablet Take 220 mg by mouth 2 (two) times daily as needed (for headache).     . nitroGLYCERIN (NITROSTAT) 0.4 MG SL tablet Place 1 tablet (0.4 mg total) under the tongue every 5 (five) minutes as needed for chest pain. 25 tablet 4  . RABEprazole (ACIPHEX) 20 MG tablet Take 20 mg by mouth daily.    . rosuvastatin (CRESTOR) 20 MG tablet TAKE 1 TABLET BY MOUTH EVERY DAY 30 tablet 11   No current facility-administered medications on file prior to visit.    Allergies  Allergen Reactions  . Lisinopril Cough and Rash  . Amlodipine Besylate Other (See Comments)    Headache, weakness, dizziness  . Propofol Other (See Comments)    Heart rate dropped   Family History  Problem Relation Age of Onset  . Heart disease Father        hx of MI  . Diabetes Mother        and brother   . Kidney failure Mother   . Breast cancer Sister   . Colon cancer Neg Hx   . Stomach cancer Neg Hx    PE: BP 136/90   Pulse 64   Ht 5\' 10"  (1.778 m)   Wt 192 lb (87.1 kg)   SpO2 98%    BMI 27.55 kg/m  Wt Readings from Last 3 Encounters:  01/31/18 192 lb (87.1 kg)  12/23/17 196 lb (88.9 kg)  08/02/17 195 lb 8 oz (88.7 kg)   Constitutional: overweight, in NAD Eyes: PERRLA, EOMI, no exophthalmos ENT: moist mucous membranes, no thyromegaly, no cervical lymphadenopathy Cardiovascular: RRR, No MRG Respiratory: CTA B Gastrointestinal: abdomen soft, NT, ND, BS+ Musculoskeletal: no deformities, strength intact in all 4 Skin: moist, warm, no rashes Neurological: no tremor with outstretched hands, DTR normal in all 4  ASSESSMENT: 1. Mild hypothyroidism  2. DM2, diet- controlled - + CAD  3.  Overweight  PLAN:  1. Patient with few years of mild hypothyroidism, not on levothyroxine -He appears euthyroid and has no complaints at this visit -His TFTs fluctuate, but we discussed that we only need to start levothyroxine if the increase above 10 or if his free thyroid hormones become abnormal -At last visit, his TSH level was normal, as were his free T4 and free T3.  We did not have to start levothyroxine then -He is very reticent to start levothyroxine due to previous experience with the medication, but I believe that this was due to the high dose used -We will check his TFTs today and if absolutely needed, we can start a very low dose of levothyroxine, 25 mcg daily -I will see him back in 6 months  2. DM2, controlled reviewed latest HbA1c from last visit, which was excellent, at 6.3%. -He is very reticent to start medicines, but agrees to start metformin if absolutely needed - today, HbA1c is 6.1% (better) - continue checking sugars at different times of the day - check 1x a day or every other day, rotating checks -Given flu shot today - Return to clinic in 6 mo with sugar log   3.  Overweight -He lost several pounds since last visit.  He is reducing his starches and we discussed that his  insulin resistance is reversed by reducing fat, rather than starches.  I recommended  2 books for him to read about this: Patient Instructions  Please read: - The Starch Solution - Dr. Sherri Sear - The Cheese trap - Dr. Alyssa Grove  Please stop at the lab.  Please come back for a follow-up appointment in 6 months.  Office Visit on 01/31/2018  Component Date Value Ref Range Status  . TSH 01/31/2018 3.99  0.35 - 4.50 uIU/mL Final  . Free T4 01/31/2018 0.80  0.60 - 1.60 ng/dL Final   Comment: Specimens from patients who are undergoing biotin therapy and /or ingesting biotin supplements may contain high levels of biotin.  The higher biotin concentration in these specimens interferes with this Free T4 assay.  Specimens that contain high levels  of biotin may cause false high results for this Free T4 assay.  Please interpret results in light of the total clinical presentation of the patient.    . T3, Free 01/31/2018 2.8  2.3 - 4.2 pg/mL Final   TFTs are normal. Daniel Kingdom, MD PhD [facility] Endocrinology

## 2018-01-31 NOTE — Patient Instructions (Addendum)
Please read: - The Starch Solution - Dr. Sherri Sear - The Cheese trap - Dr. Alyssa Grove  Please stop at the lab.  Please come back for a follow-up appointment in 6 months.

## 2018-02-01 LAB — T4, FREE: Free T4: 0.8 ng/dL (ref 0.60–1.60)

## 2018-02-01 LAB — T3, FREE: T3, Free: 2.8 pg/mL (ref 2.3–4.2)

## 2018-02-01 LAB — TSH: TSH: 3.99 u[IU]/mL (ref 0.35–4.50)

## 2018-02-21 DIAGNOSIS — X32XXXA Exposure to sunlight, initial encounter: Secondary | ICD-10-CM | POA: Diagnosis not present

## 2018-02-21 DIAGNOSIS — D225 Melanocytic nevi of trunk: Secondary | ICD-10-CM | POA: Diagnosis not present

## 2018-02-21 DIAGNOSIS — L570 Actinic keratosis: Secondary | ICD-10-CM | POA: Diagnosis not present

## 2018-02-21 DIAGNOSIS — Z1283 Encounter for screening for malignant neoplasm of skin: Secondary | ICD-10-CM | POA: Diagnosis not present

## 2018-03-14 ENCOUNTER — Other Ambulatory Visit: Payer: Self-pay | Admitting: Internal Medicine

## 2018-03-14 NOTE — Telephone Encounter (Signed)
Please review for refill.  

## 2018-04-16 DIAGNOSIS — M545 Low back pain: Secondary | ICD-10-CM | POA: Diagnosis not present

## 2018-04-16 DIAGNOSIS — Z6827 Body mass index (BMI) 27.0-27.9, adult: Secondary | ICD-10-CM | POA: Diagnosis not present

## 2018-04-16 DIAGNOSIS — G8929 Other chronic pain: Secondary | ICD-10-CM | POA: Diagnosis not present

## 2018-04-19 DIAGNOSIS — I739 Peripheral vascular disease, unspecified: Secondary | ICD-10-CM | POA: Diagnosis not present

## 2018-04-19 DIAGNOSIS — M545 Low back pain: Secondary | ICD-10-CM | POA: Diagnosis not present

## 2018-04-19 DIAGNOSIS — M48061 Spinal stenosis, lumbar region without neurogenic claudication: Secondary | ICD-10-CM | POA: Diagnosis not present

## 2018-04-19 DIAGNOSIS — M4186 Other forms of scoliosis, lumbar region: Secondary | ICD-10-CM | POA: Diagnosis not present

## 2018-04-21 ENCOUNTER — Telehealth: Payer: Self-pay | Admitting: Cardiovascular Disease

## 2018-04-21 DIAGNOSIS — M5136 Other intervertebral disc degeneration, lumbar region: Secondary | ICD-10-CM | POA: Diagnosis not present

## 2018-04-21 DIAGNOSIS — M4186 Other forms of scoliosis, lumbar region: Secondary | ICD-10-CM | POA: Diagnosis not present

## 2018-04-21 DIAGNOSIS — M48061 Spinal stenosis, lumbar region without neurogenic claudication: Secondary | ICD-10-CM | POA: Diagnosis not present

## 2018-04-21 DIAGNOSIS — M545 Low back pain: Secondary | ICD-10-CM | POA: Diagnosis not present

## 2018-04-21 DIAGNOSIS — I739 Peripheral vascular disease, unspecified: Secondary | ICD-10-CM | POA: Diagnosis not present

## 2018-04-21 NOTE — Telephone Encounter (Signed)
New message   Pt c/o medication issue:  1. Name of Medication: clopidogrel (PLAVIX) 75 MG tablet   2. How are you currently taking this medication (dosage and times per day)? 1 pill once a day   3. Are you having a reaction (difficulty breathing--STAT)? no  4. What is your medication issue? Dr Ardyth Gal wants pt to hold this medication because he is having back problems and trying to rule out the cause please advise /ok to give info to wife

## 2018-04-21 NOTE — Telephone Encounter (Signed)
Spoke with patient who states he had an MRI for new onset back pain that started last week. States MRI showed something that looked like a blood clot in the spine and Dr. Tonita Cong has asked him to stop Plavix. Patient states his coronary stenting was done in 2014 which is documented; patient had cath in 2018 and aggressive dual anti-platelet therapy was recommended. He reports he has been taking Aspirin 81 mg x 2 daily because he thought he needed more than 81 mg daily. I advised that I can talk with DOD for approval to stop Plavix and Aspirin or patient can wait for Dr. Elmarie Shiley response on Monday. Patient states he would like to go ahead and stop Plavix (which he has already done) but he will continue Aspirin 81 mg daily unless MRI with contrast scheduled tomorrow shows something and he is asked to stop by Dr. Tonita Cong. I asked him to call back to let us know the plan and he thanked me for my help.

## 2018-04-22 DIAGNOSIS — M545 Low back pain: Secondary | ICD-10-CM | POA: Diagnosis not present

## 2018-04-22 NOTE — Telephone Encounter (Signed)
[place] may hold Plavix. He should take ASA 81 mg a day.  Will await results of MRI and recommendations of Dr. Tonita Cong as to whether or not he needs to hold the ASA also.

## 2018-04-26 ENCOUNTER — Telehealth: Payer: Self-pay | Admitting: *Deleted

## 2018-04-26 DIAGNOSIS — M4186 Other forms of scoliosis, lumbar region: Secondary | ICD-10-CM | POA: Diagnosis not present

## 2018-04-26 DIAGNOSIS — M545 Low back pain: Secondary | ICD-10-CM | POA: Diagnosis not present

## 2018-04-26 DIAGNOSIS — I739 Peripheral vascular disease, unspecified: Secondary | ICD-10-CM | POA: Diagnosis not present

## 2018-04-26 DIAGNOSIS — M48061 Spinal stenosis, lumbar region without neurogenic claudication: Secondary | ICD-10-CM | POA: Diagnosis not present

## 2018-04-26 NOTE — Telephone Encounter (Signed)
   [facility] Pre-operative Risk Assessment    Request for surgical clearance:  1. What type of surgery is being performed?  LUMBAR DECOMPRESSION   2. When is this surgery scheduled? TBD   3. What type of clearance is required (medical clearance vs. Pharmacy clearance to hold med vs. Both)? MEDICAL  4. Are there any medications that need to be held prior to surgery and how long? NOT SPECIFIED  5. Practice name and name of physician performing surgery?  JEFFREY Windy Kalata MD   6. What is your office phone number [phone]    7.   What is your office fax number [phone]  8.   Anesthesia type (None, local, MAC, general) ?  NOT Tonita Phoenix 04/26/2018, 4:51 PM  _________________________________________________________________   (provider comments below)

## 2018-04-27 DIAGNOSIS — Z01818 Encounter for other preprocedural examination: Secondary | ICD-10-CM | POA: Diagnosis not present

## 2018-04-29 NOTE — Telephone Encounter (Signed)
OK to hold ASA for 5-7 days  for spinal surgery

## 2018-04-29 NOTE — Telephone Encounter (Signed)
   Primary Cardiologist: Mertie Moores, MD  Chart reviewed as part of pre-operative protocol coverage. Patient was contacted 04/29/2018 in reference to pre-operative risk assessment for pending surgery as outlined below.  PAXTON BINNS was last seen on 12/2017 by Dr. Acie Fredrickson. H/o CAD s/p PCI 2014, bradycardia after propofol for endoscopy (BB reduced), OSA, COPD, HTN, HLD, GERD, tobacco abuse. Last cath 10/2017 with multivessel CAD including diffuse calcified LAD disease of up to 60-70% (FFR 0.73), moderate to severe LCx and OM3 disease (FFR 0.83 at submaximal hyperemia), and moderate in-stent restenosis of mid RCA. Medical therapy continued at that time. I spoke with the patient today who affirms he's not had any angina whatsoever. He is walking on the treadmill daily without any  CP or SOB. His major issue is back pain which prompted need for surgery. He does understand with cardiac history he is at increased risk of complications but based on lack of symptoms and ACC/AHA guidelines, the patient would be at acceptable risk for the planned procedure without further cardiovascular testing.   From a cardiac standpoint we always prefer to continue blood thinners if this is felt safe but in the event aspirin needs to be held, Dr. Acie Fredrickson has indicated "OK to hold ASA for 5-7 days for spinal surgery "  I will route this recommendation to the requesting party via [facility] fax function and remove from pre-op pool.  Please call with questions.  Charlie Pitter, PA-C 04/29/2018, 11:50 AM

## 2018-04-29 NOTE — Telephone Encounter (Signed)
Spoke with Judeen Hammans from Dr. Reather Littler office. She states that they would like for pt to hold all antiplatelets / anticoags during surgery for 5-7 days prior, but if physician recommends that pt continue Aspirin, then just to send the recommendations.  I did advise that the pt was already holding Plavix starting 04/21/18 due to concern of blood clot  Also, ANESTHESIA will be general

## 2018-04-29 NOTE — Telephone Encounter (Signed)
Left a detailed message on surgery scheduler, Judeen Hammans, voicemail re: surgical clearance and clarification needed below. Asked her to call back and ask for the preop pool.

## 2018-04-29 NOTE — Telephone Encounter (Signed)
   Primary Cardiologist: Mertie Moores, MD  Chart reviewed as part of pre-operative protocol coverage.  Pre-op callback staff, can you please clarify what antiplatelets need to be held for procedure?  There is a phone note from 04/21/18 indicating that Plavix was stopped due concern for blood clot on the spine, but Dr. Acie Fredrickson advised to continue ASA. I will reach out to Dr. Acie Fredrickson to find out if he feels the patient would be able to hold ASA if necessary for lumbar decompression. Dr. Acie Fredrickson, - Please route response to [facility] PREOP (the pre-op pool). Thank you.     Charlie Pitter, PA-C 04/29/2018, 10:36 AM

## 2018-05-03 ENCOUNTER — Ambulatory Visit: Payer: Self-pay | Admitting: Orthopedic Surgery

## 2018-05-03 NOTE — H&P (Signed)
Daniel Carey is an 68 y.o. male.   Chief Complaint: back and leg pain, weakness HPI: Reported by patient. Reason for Visit: Diagnositc Results (lumbar MRI) Context: The patient is 2 weeks out from flare up. Location (Lower Extremity): lower back pain bilateral; thigh pain bilateral, , Severity: pain level 10/10 Associated Symptoms: weakness Medications: The patient is taking Robaxin. Patient is reporting pain that radiates down into his leg when he attempts to stand. He is not have any significant improvement since seen last. He would like to discuss surgery.  He is here with his wife.  No change in bowel or bladder function  Past Medical History:  Diagnosis Date  . 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 CAD (coronary artery disease)    moderate to severe distal CAD with large epicardial arteries having only mild to moderate irregularities. Last cath in Dec 2006; s/p cath with Promus DES in distal RCA and Promus DES in the mid vessel in an overlapping fashion  . Chest pain   . Complication of anesthesia    heart rate dropping with propofol last procedure  . COPD (chronic obstructive pulmonary disease) ([place])   . ED (erectile dysfunction)   . Esophageal stricture   . External hemorrhoids without mention of complication   . GERD (gastroesophageal reflux disease)   . Headache(784.0)   . Hiatal hernia   . HLD (hyperlipidemia)   . HTN (hypertension)   . Nephrolithiasis   . Personal history of colonic polyps 03/12/2010   TUBULAR ADENOMA  . Tobacco abuse     Past Surgical History:  Procedure Laterality Date  . ANGIOPLASTY     X3  . COLONOSCOPY N/A 02/16/2017   Procedure: COLONOSCOPY;  Surgeon: Jerene Bears, MD;  Location: [facility];  Service: Gastroenterology;  Laterality: N/A;  . LEFT HEART CATH AND CORONARY ANGIOGRAPHY N/A 11/05/2016   Procedure: Left Heart Cath and Coronary Angiography;  Surgeon: Nelva Bush, MD;  Location: [place] [facility];  Service:  Cardiovascular;  Laterality: N/A;  . PERCUTANEOUS CORONARY ROTOBLATOR INTERVENTION (PCI-R) N/A 06/30/2012   Procedure: PERCUTANEOUS CORONARY ROTOBLATOR INTERVENTION (PCI-R);  Surgeon: Burnell Blanks, MD;  Location: [facility] CATH LAB;  Service: Cardiovascular;  Laterality: N/A;  . VIDEO BRONCHOSCOPY  08/13/2011   Procedure: VIDEO BRONCHOSCOPY WITHOUT FLUORO;  Surgeon: Tanda Rockers, MD;  Location: [facility];  Service: Cardiopulmonary;  Laterality: Bilateral;    Family History  Problem Relation Age of Onset  . Heart disease Father        hx of MI  . Diabetes Mother        and brother   . Kidney failure Mother   . Breast cancer Sister   . Colon cancer Neg Hx   . Stomach cancer Neg Hx    Social History:  reports that he quit smoking about 6 years ago. His smoking use included cigarettes. He has a 20.00 pack-year smoking history. He has never used smokeless tobacco. He reports current alcohol use. He reports that he does not use drugs.  Allergies:  Allergies  Allergen Reactions  . Lisinopril Cough and Rash  . Amlodipine Besylate Other (See Comments)    Headache, weakness, dizziness  . Propofol Other (See Comments)    Heart rate dropped   Medications: clopidogreL 75 mg tablet fenofibric acid (choline) 135 mg capsule,delayed release hydroCHLOROthiazide 25 mg tablet isosorbide mononitrate ER 30 mg tablet,extended release 24 hr metoprolol tartrate 25 mg tablet nitroglycerin 0.4 mg  sublingual tablet oxyCODONE-acetaminophen 5 mg-325 mg tablet ProAir HFA 90 mcg/actuation aerosol inhaler RABEprazole 20 mg tablet,delayed release Robaxin 500 mg tablet rosuvastatin 20 mg tablet Spiriva Respimat 2.5 mcg/actuation solution for inhalation  Review of Systems  Constitutional: Negative.   HENT: Negative.   Eyes: Negative.   Respiratory: Negative.   Cardiovascular: Negative.   Gastrointestinal: Negative.   Genitourinary: Negative.   Musculoskeletal: Positive for back pain.  Skin:  Negative.   Neurological: Positive for sensory change and focal weakness.    There were no vitals taken for this visit. Physical Exam  Constitutional: He is oriented to person, place, and time. He appears well-developed and well-nourished. He appears distressed.  HENT:  Head: Normocephalic.  Eyes: Pupils are equal, round, and reactive to light.  Neck: Normal range of motion.  Cardiovascular: Normal rate.  Respiratory: Effort normal.  GI: Soft.  Musculoskeletal:     Comments: Patient is a 68 year old male.  Gait and Station: Appearance: ambulating with no assistive devices and antalgic gait.  Constitutional: General Appearance: healthy-appearing and distress (mild).  Psychiatric: Mood and Affect: active and alert.  Cardiovascular System: Edema Right: none; Dorsalis and posterior tibial pulses 2+. Edema Left: none.  Abdomen: Inspection and Palpation: non-distended and no tenderness.  Skin: Inspection and palpation: no rash.  Lumbar Spine: Inspection: normal alignment. Bony Palpation of the Lumbar Spine: tender at lumbosacral junction.. Bony Palpation of the Right Hip: no tenderness of the greater trochanter and tenderness of the SI joint; Pelvis stable. Bony Palpation of the Left Hip: no tenderness of the greater trochanter and tenderness of the SI joint. Soft Tissue Palpation on the Right: No flank pain with percussion. Active Range of Motion: limited flexion and extention.  Motor Strength: L1 Motor Strength on the Right: hip flexion iliopsoas 5/5. L1 Motor Strength on the Left: hip flexion iliopsoas 5/5. L2-L4 Motor Strength on the Right: knee extension quadriceps 5/5. L2-L4 Motor Strength on the Left: knee extension quadriceps 5/5. L5 Motor Strength on the Right: ankle dorsiflexion tibialis anterior 5/5 and great toe extension extensor hallucis longus 5/5. L5 Motor Strength on the Left: ankle dorsiflexion tibialis anterior 5/5 and great toe extension extensor hallucis longus 5/5. S1  Motor Strength on the Right: plantar flexion gastrocnemius 5/5. S1 Motor Strength on the Left: plantar flexion gastrocnemius 5/5.  Neurological System: Knee Reflex Right: normal (2). Knee Reflex Left: normal (2). Ankle Reflex Right: normal (2). Ankle Reflex Left: normal (2). Babinski Reflex Right: plantar reflex absent. Babinski Reflex Left: plantar reflex absent. Sensation on the Right: normal distal extremities. Sensation on the Left: normal distal extremities. Special Tests on the Right: no clonus of the ankle/knee and seated straight leg raising test positive. Special Tests on the Left: no clonus of the ankle/knee and seated straight leg raising test positive.  Normal cervical lordosis. No pain with range of motion. No palpable tenderness. Motor is 5/5 in all groups in the upper extremities. Upper extremity sensory exam normal. Patient is normoreflexic in the upper extremities. No Hoffmann sign. Straight leg raise produces low back pain. Also buttock pain bilaterally.  Neurological: He is alert and oriented to person, place, and time.  Skin: Skin is warm and dry.    MRI review demonstrates a disc herniation L2-3. A posterior fluid collection most likely blood and not meningioma.  Resultant severe stenosis at L2-3.  Underlying scoliosis.  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 Again avoid extension favor flexion.  We discussed  of continued conservative treatment versus lumbar decompression.  Will be required at L2-3. Central decompression possible laminectomy.  Discussed risk and benefits of that.  I had an extensive discussion with the patient concerning the pathology relevant anatomy and treatment options. At this point exhausting conservative treatment and in the presence of a neurologic deficit we discussed microlumbar decompression. I discussed the risks and benefits including bleeding, infection, DVT, PE, anesthetic  complications, worsening in their symptoms, improvement in their symptoms, C SF leakage, epidural fibrosis, need for future surgeries such as revision discectomy and lumbar fusion. I also indicated that this is an operation to basically decompress the nerve root to allow recovery as opposed to fixing a herniated disc and that the incidence of recurrent chest disc herniation can approach 15%. Also that nerve root recovery is variable and may not recover completely.  I discussed the operative course including overnight in the hospital. Immediate ambulation. Follow-up in 2 weeks for suture removal. 6 weeks until healing of the herniation followed by 6 weeks of reconditioning and strengthening of the core musculature. Also discussed the need to employ the concepts of disc pressure management and core motion following the surgery to minimize the risk of recurrent disc herniation. We will obtain preoperative clearance i if necessary and proceed accordingly.  Given his scoliosis if he has persistent symptoms are progressive degeneration and increase in scoliotic curve he may require a revision decompression and fusion.  I reviewed the studies with Dr. Rolena Infante we had that discussion.  He will require preoperative clearance both from his medical doctor and his cardiologist.  The meantime if he develops any change in bowel or bladder function is present to the emergency room to avoid permanent nerve deficit we discussed signs and symptoms of cauda equina.  Given prescription for Percocet to be taken as directed For nighttime pain. is here with his wife.  Plan microlumbar decompression L2-3, possible L1-2  Cecilie Kicks, PA-C for Dr. Tonita Cong 05/03/2018, 12:00 PM

## 2018-05-03 NOTE — H&P (View-Only) (Signed)
Daniel Carey is an 68 y.o. male.   Chief Complaint: back and leg pain, weakness HPI: Reported by patient. Reason for Visit: Diagnositc Results (lumbar MRI) Context: The patient is 2 weeks out from flare up. Location (Lower Extremity): lower back pain bilateral; thigh pain bilateral, , Severity: pain level 10/10 Associated Symptoms: weakness Medications: The patient is taking Robaxin. Patient is reporting pain that radiates down into his leg when he attempts to stand. He is not have any significant improvement since seen last. He would like to discuss surgery.  He is here with his wife.  No change in bowel or bladder function  Past Medical History:  Diagnosis Date  . 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 CAD (coronary artery disease)    moderate to severe distal CAD with large epicardial arteries having only mild to moderate irregularities. Last cath in Dec 2006; s/p cath with Promus DES in distal RCA and Promus DES in the mid vessel in an overlapping fashion  . Chest pain   . Complication of anesthesia    heart rate dropping with propofol last procedure  . COPD (chronic obstructive pulmonary disease) ([place])   . ED (erectile dysfunction)   . Esophageal stricture   . External hemorrhoids without mention of complication   . GERD (gastroesophageal reflux disease)   . Headache(784.0)   . Hiatal hernia   . HLD (hyperlipidemia)   . HTN (hypertension)   . Nephrolithiasis   . Personal history of colonic polyps 03/12/2010   TUBULAR ADENOMA  . Tobacco abuse     Past Surgical History:  Procedure Laterality Date  . ANGIOPLASTY     X3  . COLONOSCOPY N/A 02/16/2017   Procedure: COLONOSCOPY;  Surgeon: Jerene Bears, MD;  Location: [facility];  Service: Gastroenterology;  Laterality: N/A;  . LEFT HEART CATH AND CORONARY ANGIOGRAPHY N/A 11/05/2016   Procedure: Left Heart Cath and Coronary Angiography;  Surgeon: Nelva Bush, MD;  Location: [place] [facility];  Service:  Cardiovascular;  Laterality: N/A;  . PERCUTANEOUS CORONARY ROTOBLATOR INTERVENTION (PCI-R) N/A 06/30/2012   Procedure: PERCUTANEOUS CORONARY ROTOBLATOR INTERVENTION (PCI-R);  Surgeon: Burnell Blanks, MD;  [facility] CATH [facility];  Service: Cardiovascular;  Laterality: N/A;  . VIDEO BRONCHOSCOPY  08/13/2011   Procedure: VIDEO BRONCHOSCOPY WITHOUT FLUORO;  Surgeon: Tanda Rockers, MD;  Location: [facility];  Service: Cardiopulmonary;  Laterality: Bilateral;    Family History  Problem Relation Age of Onset  . Heart disease Father        hx of MI  . Diabetes Mother        and brother   . Kidney failure Mother   . Breast cancer Sister   . Colon cancer Neg Hx   . Stomach cancer Neg Hx    Social History:  reports that he quit smoking about 6 years ago. His smoking use included cigarettes. He has a 20.00 pack-year smoking history. He has never used smokeless tobacco. He reports current alcohol use. He reports that he does not use drugs.  Allergies:  Allergies  Allergen Reactions  . Lisinopril Cough and Rash  . Amlodipine Besylate Other (See Comments)    Headache, weakness, dizziness  . Propofol Other (See Comments)    Heart rate dropped   Medications: clopidogreL 75 mg tablet fenofibric acid (choline) 135 mg capsule,delayed release hydroCHLOROthiazide 25 mg tablet isosorbide mononitrate ER 30 mg tablet,extended release 24 hr metoprolol tartrate 25 mg tablet nitroglycerin 0.4 mg  sublingual tablet oxyCODONE-acetaminophen 5 mg-325 mg tablet ProAir HFA 90 mcg/actuation aerosol inhaler RABEprazole 20 mg tablet,delayed release Robaxin 500 mg tablet rosuvastatin 20 mg tablet Spiriva Respimat 2.5 mcg/actuation solution for inhalation  Review of Systems  Constitutional: Negative.   HENT: Negative.   Eyes: Negative.   Respiratory: Negative.   Cardiovascular: Negative.   Gastrointestinal: Negative.   Genitourinary: Negative.   Musculoskeletal: Positive for back pain.  Skin:  Negative.   Neurological: Positive for sensory change and focal weakness.    There were no vitals taken for this visit. Physical Exam  Constitutional: He is oriented to person, place, and time. He appears well-developed and well-nourished. He appears distressed.  HENT:  Head: Normocephalic.  Eyes: Pupils are equal, round, and reactive to light.  Neck: Normal range of motion.  Cardiovascular: Normal rate.  Respiratory: Effort normal.  GI: Soft.  Musculoskeletal:     Comments: Patient is a 68 year old male.  Gait and Station: Appearance: ambulating with no assistive devices and antalgic gait.  Constitutional: General Appearance: healthy-appearing and distress (mild).  Psychiatric: Mood and Affect: active and alert.  Cardiovascular System: Edema Right: none; Dorsalis and posterior tibial pulses 2+. Edema Left: none.  Abdomen: Inspection and Palpation: non-distended and no tenderness.  Skin: Inspection and palpation: no rash.  Lumbar Spine: Inspection: normal alignment. Bony Palpation of the Lumbar Spine: tender at lumbosacral junction.. Bony Palpation of the Right Hip: no tenderness of the greater trochanter and tenderness of the SI joint; Pelvis stable. Bony Palpation of the Left Hip: no tenderness of the greater trochanter and tenderness of the SI joint. Soft Tissue Palpation on the Right: No flank pain with percussion. Active Range of Motion: limited flexion and extention.  Motor Strength: L1 Motor Strength on the Right: hip flexion iliopsoas 5/5. L1 Motor Strength on the Left: hip flexion iliopsoas 5/5. L2-L4 Motor Strength on the Right: knee extension quadriceps 5/5. L2-L4 Motor Strength on the Left: knee extension quadriceps 5/5. L5 Motor Strength on the Right: ankle dorsiflexion tibialis anterior 5/5 and great toe extension extensor hallucis longus 5/5. L5 Motor Strength on the Left: ankle dorsiflexion tibialis anterior 5/5 and great toe extension extensor hallucis longus 5/5. S1  Motor Strength on the Right: plantar flexion gastrocnemius 5/5. S1 Motor Strength on the Left: plantar flexion gastrocnemius 5/5.  Neurological System: Knee Reflex Right: normal (2). Knee Reflex Left: normal (2). Ankle Reflex Right: normal (2). Ankle Reflex Left: normal (2). Babinski Reflex Right: plantar reflex absent. Babinski Reflex Left: plantar reflex absent. Sensation on the Right: normal distal extremities. Sensation on the Left: normal distal extremities. Special Tests on the Right: no clonus of the ankle/knee and seated straight leg raising test positive. Special Tests on the Left: no clonus of the ankle/knee and seated straight leg raising test positive.  Normal cervical lordosis. No pain with range of motion. No palpable tenderness. Motor is 5/5 in all groups in the upper extremities. Upper extremity sensory exam normal. Patient is normoreflexic in the upper extremities. No Hoffmann sign. Straight leg raise produces low back pain. Also buttock pain bilaterally.  Neurological: He is alert and oriented to person, place, and time.  Skin: Skin is warm and dry.    MRI review demonstrates a disc herniation L2-3. A posterior fluid collection most likely blood and not meningioma.  Resultant severe stenosis at L2-3.  Underlying scoliosis.  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 Again avoid extension favor flexion.  We discussed  of continued conservative treatment versus lumbar decompression.  Will be required at L2-3. Central decompression possible laminectomy.  Discussed risk and benefits of that.  I had an extensive discussion with the patient concerning the pathology relevant anatomy and treatment options. At this point exhausting conservative treatment and in the presence of a neurologic deficit we discussed microlumbar decompression. I discussed the risks and benefits including bleeding, infection, DVT, PE, anesthetic  complications, worsening in their symptoms, improvement in their symptoms, C SF leakage, epidural fibrosis, need for future surgeries such as revision discectomy and lumbar fusion. I also indicated that this is an operation to basically decompress the nerve root to allow recovery as opposed to fixing a herniated disc and that the incidence of recurrent chest disc herniation can approach 15%. Also that nerve root recovery is variable and may not recover completely.  I discussed the operative course including overnight in the hospital. Immediate ambulation. Follow-up in 2 weeks for suture removal. 6 weeks until healing of the herniation followed by 6 weeks of reconditioning and strengthening of the core musculature. Also discussed the need to employ the concepts of disc pressure management and core motion following the surgery to minimize the risk of recurrent disc herniation. We will obtain preoperative clearance i if necessary and proceed accordingly.  Given his scoliosis if he has persistent symptoms are progressive degeneration and increase in scoliotic curve he may require a revision decompression and fusion.  I reviewed the studies with Dr. Rolena Infante we had that discussion.  He will require preoperative clearance both from his medical doctor and his cardiologist.  The meantime if he develops any change in bowel or bladder function is present to the emergency room to avoid permanent nerve deficit we discussed signs and symptoms of cauda equina.  Given prescription for Percocet to be taken as directed For nighttime pain. is here with his wife.  Plan microlumbar decompression L2-3, possible L1-2  Cecilie Kicks, PA-C for Dr. Tonita Cong 05/03/2018, 12:00 PM

## 2018-05-04 ENCOUNTER — Other Ambulatory Visit: Payer: Self-pay | Admitting: Cardiovascular Disease

## 2018-05-04 NOTE — Telephone Encounter (Signed)
Okay to refill or should this be deferred to patients pcp? Please advise. Thanks, MI

## 2018-05-10 ENCOUNTER — Ambulatory Visit (HOSPITAL_COMMUNITY)
Admission: RE | Admit: 2018-05-10 | Discharge: 2018-05-10 | Disposition: A | Discharge status: Home / Self Care | Payer: BLUE CROSS/BLUE SHIELD | Source: Ambulatory Visit | Attending: Orthopedic Surgery | Admitting: Orthopedic Surgery

## 2018-05-10 ENCOUNTER — Other Ambulatory Visit: Payer: Self-pay

## 2018-05-10 ENCOUNTER — Encounter (HOSPITAL_COMMUNITY)
Admission: RE | Admit: 2018-05-10 | Discharge: 2018-05-10 | Disposition: A | Discharge status: Home / Self Care | Payer: BLUE CROSS/BLUE SHIELD | Source: Ambulatory Visit | Attending: Specialist | Admitting: Specialist

## 2018-05-10 ENCOUNTER — Encounter (HOSPITAL_COMMUNITY): Payer: Self-pay

## 2018-05-10 DIAGNOSIS — M47816 Spondylosis without myelopathy or radiculopathy, lumbar region: Secondary | ICD-10-CM | POA: Diagnosis not present

## 2018-05-10 DIAGNOSIS — M5126 Other intervertebral disc displacement, lumbar region: Secondary | ICD-10-CM | POA: Diagnosis not present

## 2018-05-10 HISTORY — DX: Personal history of urinary calculi: Z87.442

## 2018-05-10 LAB — SURGICAL PCR SCREEN
MRSA, PCR: NEGATIVE
Staphylococcus aureus: NEGATIVE

## 2018-05-10 LAB — CBC
HCT: 43 % (ref 39.0–52.0)
Hemoglobin: 14.2 g/dL (ref 13.0–17.0)
MCH: 30.3 pg (ref 26.0–34.0)
MCHC: 33 g/dL (ref 30.0–36.0)
MCV: 91.7 fL (ref 80.0–100.0)
Platelets: 510 10*3/uL — ABNORMAL HIGH (ref 150–400)
RBC: 4.69 MIL/uL (ref 4.22–5.81)
RDW: 13.2 % (ref 11.5–15.5)
WBC: 8.4 10*3/uL (ref 4.0–10.5)
nRBC: 0 % (ref 0.0–0.2)

## 2018-05-10 LAB — BASIC METABOLIC PANEL
Anion gap: 8 (ref 5–15)
BUN: 19 mg/dL (ref 8–23)
CO2: 26 mmol/L (ref 22–32)
Calcium: 9.5 mg/dL (ref 8.9–10.3)
Chloride: 104 mmol/L (ref 98–111)
Creatinine, Ser: 1.4 mg/dL — ABNORMAL HIGH (ref 0.61–1.24)
GFR calc Af Amer: >60 mL/min (ref >60)
GFR calc non Af Amer: 52 mL/min — ABNORMAL LOW (ref >60)
Glucose, Bld: 101 mg/dL — ABNORMAL HIGH (ref 70–99)
Potassium: 4.5 mmol/L (ref 3.5–5.1)
Sodium: 138 mmol/L (ref 135–145)

## 2018-05-10 LAB — PROTIME-INR
INR: 1.14
Prothrombin Time: 14.5 seconds (ref 11.4–15.2)

## 2018-05-10 NOTE — Pre-Procedure Instructions (Signed)
Daniel Carey  05/10/2018      [id number] - [place], [place] [address] Phone: [phone] Fax: [phone]    Your procedure is scheduled on January 23rd.  Report to [facility] Admitting at 5:30 A.M.  Call this number if you have problems the morning of surgery:  [phone]   Remember:  Do not eat or drink after midnight.     Take these medicines the morning of surgery with A SIP OF WATER   Tylenol - if needed  Pepcid - if needed  Metoprolol  Nitroglycerin tablet - if needed  Proair Inhaler - if needed  Follow your surgeon's instructions on when to stop Asprin & Plavix.  If no instructions were given by your surgeon then you will need to call the office to get those instructions.    7 days prior to surgery STOP taking any Aleve, Naproxen, Ibuprofen, Motrin, Advil, Goody's, BC's, all herbal medications, fish oil, and all vitamins.      Do not wear jewelry.  Do not wear lotions, powders, or perfumes, or deodorant.  Men may shave face and neck.  Do not bring valuables to the hospital.  [facility] is not responsible for any belongings or valuables.   [place]- Preparing For Surgery  Before surgery, you can play an important role. Because skin is not sterile, your skin needs to be as free of germs as possible. You can reduce the number of germs on your skin by washing with CHG (chlorahexidine gluconate) Soap before surgery.  CHG is an antiseptic cleaner which kills germs and bonds with the skin to continue killing germs even after washing.    Oral Hygiene is also important to reduce your risk of infection.  Remember - BRUSH YOUR TEETH THE MORNING OF SURGERY WITH YOUR REGULAR TOOTHPASTE  Please do not use if you have an allergy to CHG or antibacterial soaps. If your skin becomes reddened/irritated stop using the CHG.  Do not shave (including legs and underarms) for at least 48 hours prior to  first CHG shower. It is OK to shave your face.  Please follow these instructions carefully.   1. Shower the NIGHT BEFORE SURGERY and the MORNING OF SURGERY with CHG.   2. If you chose to wash your hair, wash your hair first as usual with your normal shampoo.  3. After you shampoo, rinse your hair and body thoroughly to remove the shampoo.  4. Use CHG as you would any other liquid soap. You can apply CHG directly to the skin and wash gently with a scrungie or a clean washcloth.   5. Apply the CHG Soap to your body ONLY FROM THE NECK DOWN.  Do not use on open wounds or open sores. Avoid contact with your eyes, ears, mouth and genitals (private parts). Wash Face and genitals (private parts)  with your normal soap.  6. Wash thoroughly, paying special attention to the area where your surgery will be performed.  7. Thoroughly rinse your body with warm water from the neck down.  8. DO NOT shower/wash with your normal soap after using and rinsing off the CHG Soap.  9. Pat yourself dry with a CLEAN TOWEL.  10. Wear CLEAN PAJAMAS to bed the night before surgery, wear comfortable clothes the morning of surgery  11. Place CLEAN SHEETS on your bed the night of your first shower and DO NOT SLEEP WITH PETS.  Day of Surgery:  Do not apply any deodorants/lotions.  Please wear clean clothes to the hospital/surgery center.   Remember to brush your teeth WITH YOUR REGULAR TOOTHPASTE.   Contacts, dentures or bridgework may not be worn into surgery.  Leave your suitcase in the car.  After surgery it may be brought to your room.  For patients admitted to the hospital, discharge time will be determined by your treatment team.  Patients discharged the day of surgery will not be allowed to drive home.   Please read over the following fact sheets that you were given. Coughing and Deep Breathing, MRSA Information and Surgical Site Infection Prevention

## 2018-05-10 NOTE — Progress Notes (Signed)
PCP: Dione Housekeeper  Cardiologist: Nahser  DM: denies  SA: yes, wears CPAP  Pt stated he was instructed to stop Plavix & ASA  Stopped Plavix - 6 days ago Stopped ASA - 2 weeks ago  Pt denies SOB, cough, fever, chest pain @ PAT appt  Pt stated understanding of instructions given for DOS.

## 2018-05-11 NOTE — Progress Notes (Signed)
Anesthesia Chart Review:  Case:  [id number] Date/Time:  05/12/18 0715   Procedure:  Microlumbar decompression L2-3, possible L1-2 (N/A ) - 2 hrs   Anesthesia type:  General   Pre-op diagnosis:  HNP L2-3   Location:  [facility] OR ROOM 48 / [facility]   Surgeon:  Susa Day, MD      DISCUSSION: 68 yo male former smoker. Pertinent hx includes CAD s/p PCI 2014, bradycardia after propofol for endoscopy (BB reduced), OSA, COPD Gold III, HTN, HLD, GERD.  Clearance from PCP 04/27/2018 in OV note by Kathleen Lime, PA-C in care everywhere.   Pt follows with Dr. Acie Fredrickson for CAD s/p PCI 2014. Per his last OV note 12/23/17 the pt was doing well with no angina. Cardiac clearance by Melina Copa, PA-C 04/29/2018 states: "Chart reviewed as part of pre-operative protocol coverage. Patient was contacted 04/29/2018 in reference to pre-operative risk assessment for pending surgery as outlined below.  Daniel Carey was last seen on 12/2017 by Dr. Acie Fredrickson. H/o CAD s/p PCI 2014, bradycardia after propofol for endoscopy (BB reduced), OSA, COPD, HTN, HLD, GERD, tobacco abuse. Last cath 10/2017 with multivessel CAD including diffuse calcified LAD disease of up to 60-70% (FFR 0.73), moderate to severe LCx and OM3 disease (FFR 0.83 at submaximal hyperemia), and moderate in-stent restenosis of mid RCA. Medical therapy continued at that time. I spoke with the patient today who affirms he's not had any angina whatsoever. He is walking on the treadmill daily without any  CP or SOB. His major issue is back pain which prompted need for surgery. He does understand with cardiac history he is at increased risk of complications but based on lack of symptoms and ACC/AHA guidelines, the patient would be at acceptable risk for the planned procedure without further cardiovascular testing."  Follows with [name] for COPD. Last seen 01/15/2017. Per OV note pt reported getting SOB once or twice a week when hurrying through airport carrying backpack computer. PFTs at  that time per his note  FEV1 1.60  (46 % ) ratio 56  p 22 % improvement from saba p nothing prior to study with DLCO  46/49  % corrects to 69  % for alv volume.  Recommended continue Spiriva QAM and albuterol PRN.  Anticipate he can proceed as planned barring acute status change.   VS: BP (!) 146/64   Pulse (!) 43 Comment: taken manually/notified Emily B RN  Temp 36.5 C   Resp 20   Ht 5\' 9"  (1.753 m)   Wt 84.3 kg   SpO2 99%   BMI 27.44 kg/m   PROVIDERS: Dione Housekeeper, MD is PCP  Mertie Moores, MD is Cardiologist  Christinia Gully, MD is Pulmonologist  LABS: Labs reviewed: Acceptable for surgery. (all labs ordered are listed, but only abnormal results are displayed)  Labs Reviewed  BASIC METABOLIC PANEL - Abnormal; Notable for the following components:      Result Value   Glucose, Bld 101 (*)    Creatinine, Ser 1.40 (*)    GFR calc non Af Amer 52 (*)    All other components within normal limits  CBC - Abnormal; Notable for the following components:   Platelets 510 (*)    All other components within normal limits  SURGICAL PCR SCREEN     IMAGES: CHEST  2 VIEW 11/30/2016:  COMPARISON:  11/20/2010  FINDINGS: The heart size and mediastinal contours are within normal limits. Both lungs are clear. The visualized skeletal structures are unremarkable.  IMPRESSION: No active cardiopulmonary disease.  EKG: 12/23/2017: Sinus rhythm with 1st degree AV block. Rate 62. Cannot rule out anterior infarct, age undetermined. No change from previous.  CV: Cath 11/05/2016: Conclusions: 1. Multivessel coronary artery disease, including diffuse calcified LAD disease of up to 60-70% (FFR 0.73), moderate to severe LCx and OM3 disease (FFR 0.83 at submaximal hyperemia), and moderate in-stent restenosis of mid RCA. 2. Normal left ventricular filling pressure. 3. Normal left ventricular systolic function.  Recommendations: 1. Escalate antianginal therapy with addition of isosorbide  mononitrate. 2. Continue aggressive secondary prevention, including dual antiplatelet and statin therapy. 3. I will review the films with the intervention and cardiac surgery teams. Revascularization options include atherectomy and PCI to LAD (would require multiple stents) versus CABG. 4. Follow-up with Dr. Acie Fredrickson in approximately 2 weeks to reevaluate symptoms after addition of long-acting nitrate, as well as to readdress revascularization options.  TTE 07/26/2015: Study Conclusions  - Left ventricle: The cavity size was normal. Systolic function was   normal. The estimated ejection fraction was in the range of 60%   to 65%. Doppler parameters are consistent with abnormal left   ventricular relaxation (grade 1 diastolic dysfunction).  Nuclear stress 10/24/2012: Overall Impression:  Low risk stress nuclear study There is decreased inferior wall uptake on rest and stress images.  This is suggestive of inferior wall attenuation although old small inferior wall infarct cannot be excluded.  LV Ejection Fraction: 73%.  LV Wall Motion:  NL LV Function; NL Wall Motion  Past Medical History:  Diagnosis Date  . 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 CAD (coronary artery disease)    moderate to severe distal CAD with large epicardial arteries having only mild to moderate irregularities. Last cath in Dec 2006; s/p cath with Promus DES in distal RCA and Promus DES in the mid vessel in an overlapping fashion  . Chest pain   . Complication of anesthesia    heart rate dropping with propofol last procedure  . COPD (chronic obstructive pulmonary disease) ([place])   . ED (erectile dysfunction)   . Esophageal stricture   . External hemorrhoids without mention of complication   . GERD (gastroesophageal reflux disease)   . Headache(784.0)   . Hiatal hernia   . History of kidney stones   . HLD (hyperlipidemia)   . HTN (hypertension)   . Myocardial infarction ([place])    1996  . Personal history  of colonic polyps 03/12/2010   TUBULAR ADENOMA  . Tobacco abuse     Past Surgical History:  Procedure Laterality Date  . ANGIOPLASTY     X3  . COLONOSCOPY N/A 02/16/2017   Procedure: COLONOSCOPY;  Surgeon: Jerene Bears, MD;  Location: [facility];  Service: Gastroenterology;  Laterality: N/A;  . LEFT HEART CATH AND CORONARY ANGIOGRAPHY N/A 11/05/2016   Procedure: Left Heart Cath and Coronary Angiography;  Surgeon: Nelva Bush, MD;  Location: [facility];  Service: Cardiovascular;  Laterality: N/A;  . PERCUTANEOUS CORONARY ROTOBLATOR INTERVENTION (PCI-R) N/A 06/30/2012   Procedure: PERCUTANEOUS CORONARY ROTOBLATOR INTERVENTION (PCI-R);  Surgeon: Burnell Blanks, MD;  Location: [facility] CATH LAB;  Service: Cardiovascular;  Laterality: N/A;  . VIDEO BRONCHOSCOPY  08/13/2011   Procedure: VIDEO BRONCHOSCOPY WITHOUT FLUORO;  Surgeon: Tanda Rockers, MD;  Location: [facility];  Service: Cardiopulmonary;  Laterality: Bilateral;    MEDICATIONS: . acetaminophen (TYLENOL) 500 MG tablet  . aspirin EC 81 MG tablet  . Choline Fenofibrate (  FENOFIBRIC ACID) 135 MG CPDR  . clopidogrel (PLAVIX) 75 MG tablet  . famotidine (PEPCID) 20 MG tablet  . hydrochlorothiazide (HYDRODIURIL) 25 MG tablet  . isosorbide mononitrate (IMDUR) 30 MG 24 hr tablet  . metoprolol tartrate (LOPRESSOR) 25 MG tablet  . naproxen sodium (ANAPROX) 220 MG tablet  . nitroGLYCERIN (NITROSTAT) 0.4 MG SL tablet  . PROAIR HFA 108 (90 Base) MCG/ACT inhaler  . RABEprazole (ACIPHEX) 20 MG tablet  . rosuvastatin (CRESTOR) 20 MG tablet   No current facility-administered medications for this encounter.     Wynonia Musty [facility] Short Stay Center/Anesthesiology Phone [phone] 05/11/2018 9:38 AM

## 2018-05-11 NOTE — Anesthesia Preprocedure Evaluation (Addendum)
Anesthesia Evaluation  Patient identified by MRN, date of birth, ID band Patient awake    Reviewed: Allergy & Precautions, NPO status , Patient's Chart, lab work & pertinent test results, reviewed documented beta blocker date and time   History of Anesthesia Complications Negative for: history of anesthetic complications  Airway Mallampati: II  TM Distance: >3 FB Neck ROM: Full    Dental  (+) Dental Advisory Given, Partial Lower   Pulmonary sleep apnea and Continuous Positive Airway Pressure Ventilation , COPD, former smoker,    Pulmonary exam normal        Cardiovascular hypertension, Pt. on medications and Pt. on home beta blockers + angina + CAD, + Past MI, + Cardiac Stents and + DOE  Normal cardiovascular exam  Pt follows with Dr. Acie Fredrickson for CAD s/p PCI 2014. Per his last OV note 12/23/17 the pt was doing well with no angina. Cardiac clearance by Melina Copa, PA-C 04/29/2018 states: "Chart reviewed as part of pre-operative protocol coverage. Patient was contacted1/10/2020in reference to pre-operative risk assessment for pending surgery as outlined below. Laruth Bouchard last seen on 12/2017 by Dr. Acie Fredrickson. H/o CAD s/p PCI 2014, bradycardia after propofol for endoscopy (BB reduced), OSA, COPD, HTN, HLD, GERD, tobacco abuse. Last cath 10/2017 with multivessel CADincluding diffuse calcified LAD disease of up to 60-70% (FFR 0.73), moderate to severe LCx and OM3 disease (FFR 0.83 at submaximal hyperemia), and moderate in-stent restenosis of mid RCA.Medical therapy continued at that time. I spoke with the patient today who affirms he's not had any angina whatsoever. He is walking on the treadmill daily without any CP or SOB. His major issue is back pain which prompted need for surgery. He does understand with cardiac history he is at increased risk of complications but based onlack of symptoms andACC/AHA guidelines, the patient would be at  acceptable risk for the planned procedure without further cardiovascular testing."    Neuro/Psych negative neurological ROS  negative psych ROS   GI/Hepatic Neg liver ROS, hiatal hernia, GERD  ,  Endo/Other  diabetesHypothyroidism   Renal/GU Renal InsufficiencyRenal disease     Musculoskeletal negative musculoskeletal ROS (+)   Abdominal   Peds  Hematology negative hematology ROS (+)   Anesthesia Other Findings Day of surgery medications reviewed with the patient.  Reproductive/Obstetrics                          Anesthesia Physical Anesthesia Plan  ASA: III  Anesthesia Plan: General   Post-op Pain Management:    Induction: Intravenous  PONV Risk Score and Plan: 3 and Ondansetron, Dexamethasone and Scopolamine patch - Pre-op  Airway Management Planned: Oral ETT  Additional Equipment:   Intra-op Plan:   Post-operative Plan: Extubation in OR  Informed Consent: I have reviewed the patients History and Physical, chart, labs and discussed the procedure including the risks, benefits and alternatives for the proposed anesthesia with the patient or authorized representative who has indicated his/her understanding and acceptance.     Dental advisory given  Plan Discussed with: Anesthesiologist and CRNA  Anesthesia Plan Comments: (See PAT note by Karoline Caldwell, PA-C )      Anesthesia Quick Evaluation

## 2018-05-12 ENCOUNTER — Ambulatory Visit (HOSPITAL_COMMUNITY): Payer: BLUE CROSS/BLUE SHIELD | Admitting: Registered Nurse

## 2018-05-12 ENCOUNTER — Ambulatory Visit (HOSPITAL_COMMUNITY): Payer: BLUE CROSS/BLUE SHIELD | Admitting: Physician Assistant

## 2018-05-12 ENCOUNTER — Encounter (HOSPITAL_COMMUNITY)
Admission: RE | Disposition: A | Discharge status: Home / Self Care | Payer: Self-pay | Source: Home / Self Care | Attending: Specialist

## 2018-05-12 ENCOUNTER — Ambulatory Visit (HOSPITAL_COMMUNITY): Payer: BLUE CROSS/BLUE SHIELD

## 2018-05-12 ENCOUNTER — Other Ambulatory Visit: Payer: Self-pay

## 2018-05-12 ENCOUNTER — Encounter (HOSPITAL_COMMUNITY): Payer: Self-pay | Admitting: General Practice

## 2018-05-12 ENCOUNTER — Ambulatory Visit (HOSPITAL_COMMUNITY)
Admission: RE | Admit: 2018-05-12 | Discharge: 2018-05-13 | Disposition: A | Discharge status: Home / Self Care | Payer: BLUE CROSS/BLUE SHIELD | Attending: Specialist | Admitting: Specialist

## 2018-05-12 DIAGNOSIS — Z888 Allergy status to other drugs, medicaments and biological substances status: Secondary | ICD-10-CM | POA: Insufficient documentation

## 2018-05-12 DIAGNOSIS — G4733 Obstructive sleep apnea (adult) (pediatric): Secondary | ICD-10-CM | POA: Insufficient documentation

## 2018-05-12 DIAGNOSIS — M48061 Spinal stenosis, lumbar region without neurogenic claudication: Secondary | ICD-10-CM | POA: Diagnosis present

## 2018-05-12 DIAGNOSIS — E882 Lipomatosis, not elsewhere classified: Secondary | ICD-10-CM | POA: Insufficient documentation

## 2018-05-12 DIAGNOSIS — M545 Low back pain: Secondary | ICD-10-CM | POA: Diagnosis not present

## 2018-05-12 DIAGNOSIS — R001 Bradycardia, unspecified: Secondary | ICD-10-CM | POA: Diagnosis not present

## 2018-05-12 DIAGNOSIS — Z79899 Other long term (current) drug therapy: Secondary | ICD-10-CM | POA: Insufficient documentation

## 2018-05-12 DIAGNOSIS — E785 Hyperlipidemia, unspecified: Secondary | ICD-10-CM | POA: Insufficient documentation

## 2018-05-12 DIAGNOSIS — S34109A Unspecified injury to unspecified level of lumbar spinal cord, initial encounter: Secondary | ICD-10-CM | POA: Diagnosis not present

## 2018-05-12 DIAGNOSIS — Z955 Presence of coronary angioplasty implant and graft: Secondary | ICD-10-CM | POA: Diagnosis not present

## 2018-05-12 DIAGNOSIS — Z7982 Long term (current) use of aspirin: Secondary | ICD-10-CM | POA: Insufficient documentation

## 2018-05-12 DIAGNOSIS — I10 Essential (primary) hypertension: Secondary | ICD-10-CM | POA: Diagnosis not present

## 2018-05-12 DIAGNOSIS — I2510 Atherosclerotic heart disease of native coronary artery without angina pectoris: Secondary | ICD-10-CM | POA: Diagnosis not present

## 2018-05-12 DIAGNOSIS — Z87891 Personal history of nicotine dependence: Secondary | ICD-10-CM | POA: Diagnosis not present

## 2018-05-12 DIAGNOSIS — I252 Old myocardial infarction: Secondary | ICD-10-CM | POA: Diagnosis not present

## 2018-05-12 DIAGNOSIS — K219 Gastro-esophageal reflux disease without esophagitis: Secondary | ICD-10-CM | POA: Diagnosis not present

## 2018-05-12 DIAGNOSIS — J449 Chronic obstructive pulmonary disease, unspecified: Secondary | ICD-10-CM | POA: Diagnosis not present

## 2018-05-12 DIAGNOSIS — Z7902 Long term (current) use of antithrombotics/antiplatelets: Secondary | ICD-10-CM | POA: Insufficient documentation

## 2018-05-12 DIAGNOSIS — I441 Atrioventricular block, second degree: Secondary | ICD-10-CM | POA: Insufficient documentation

## 2018-05-12 DIAGNOSIS — E119 Type 2 diabetes mellitus without complications: Secondary | ICD-10-CM | POA: Diagnosis not present

## 2018-05-12 DIAGNOSIS — M5126 Other intervertebral disc displacement, lumbar region: Secondary | ICD-10-CM | POA: Diagnosis not present

## 2018-05-12 DIAGNOSIS — Z419 Encounter for procedure for purposes other than remedying health state, unspecified: Secondary | ICD-10-CM

## 2018-05-12 HISTORY — PX: LUMBAR LAMINECTOMY/DECOMPRESSION MICRODISCECTOMY: SHX5026

## 2018-05-12 SURGERY — LUMBAR LAMINECTOMY/DECOMPRESSION MICRODISCECTOMY 2 LEVELS
Anesthesia: General | Site: Spine Lumbar

## 2018-05-12 MED ORDER — METHOCARBAMOL 500 MG PO TABS: 500.0000 mg | ORAL_TABLET | Freq: Four times a day (QID) | ORAL | Status: DC | PRN

## 2018-05-12 MED ORDER — LIDOCAINE 2% (20 MG/ML) 5 ML SYRINGE
INTRAMUSCULAR | Status: DC | PRN
  Administered 2018-05-12: 100 mg via INTRAVENOUS

## 2018-05-12 MED ORDER — EPHEDRINE 5 MG/ML INJ
INTRAVENOUS | Status: AC
  Filled 2018-05-12: qty 10

## 2018-05-12 MED ORDER — PROMETHAZINE HCL 25 MG/ML IJ SOLN: 6.2500 mg | INTRAMUSCULAR | Status: DC | PRN

## 2018-05-12 MED ORDER — ONDANSETRON HCL 4 MG PO TABS: 4.0000 mg | ORAL_TABLET | Freq: Four times a day (QID) | ORAL | Status: DC | PRN

## 2018-05-12 MED ORDER — ACETAMINOPHEN 10 MG/ML IV SOLN
1000.0000 mg | INTRAVENOUS | Status: AC
  Administered 2018-05-12: 1000 mg via INTRAVENOUS
  Filled 2018-05-12: qty 100

## 2018-05-12 MED ORDER — KETAMINE HCL 50 MG/5ML IJ SOSY
PREFILLED_SYRINGE | INTRAMUSCULAR | Status: AC
  Filled 2018-05-12: qty 5

## 2018-05-12 MED ORDER — ALBUTEROL SULFATE (2.5 MG/3ML) 0.083% IN NEBU: 2.5000 mg | INHALATION_SOLUTION | Freq: Four times a day (QID) | RESPIRATORY_TRACT | Status: DC | PRN

## 2018-05-12 MED ORDER — ALBUMIN HUMAN 5 % IV SOLN
INTRAVENOUS | Status: DC | PRN
  Administered 2018-05-12: 09:00:00 via INTRAVENOUS

## 2018-05-12 MED ORDER — PROPOFOL 10 MG/ML IV BOLUS
INTRAVENOUS | Status: AC
  Filled 2018-05-12: qty 20

## 2018-05-12 MED ORDER — ACETAMINOPHEN 325 MG PO TABS
650.0000 mg | ORAL_TABLET | ORAL | Status: DC | PRN
  Administered 2018-05-12: 650 mg via ORAL
  Filled 2018-05-12: qty 2

## 2018-05-12 MED ORDER — METHOCARBAMOL 1000 MG/10ML IJ SOLN
500.0000 mg | Freq: Four times a day (QID) | INTRAVENOUS | Status: DC | PRN
  Filled 2018-05-12: qty 5

## 2018-05-12 MED ORDER — SODIUM CHLORIDE 0.9 % IV SOLN
INTRAVENOUS | Status: DC | PRN
  Administered 2018-05-12: 08:00:00

## 2018-05-12 MED ORDER — 0.9 % SODIUM CHLORIDE (POUR BTL) OPTIME
TOPICAL | Status: DC | PRN
  Administered 2018-05-12: 1000 mL

## 2018-05-12 MED ORDER — ROCURONIUM BROMIDE 50 MG/5ML IV SOSY
PREFILLED_SYRINGE | INTRAVENOUS | Status: DC | PRN
  Administered 2018-05-12: 100 mg via INTRAVENOUS

## 2018-05-12 MED ORDER — FENTANYL CITRATE (PF) 250 MCG/5ML IJ SOLN
INTRAMUSCULAR | Status: AC
  Filled 2018-05-12: qty 5

## 2018-05-12 MED ORDER — NITROGLYCERIN 0.4 MG SL SUBL: 0.4000 mg | SUBLINGUAL_TABLET | SUBLINGUAL | Status: DC | PRN

## 2018-05-12 MED ORDER — FAMOTIDINE 20 MG PO TABS: 20.0000 mg | ORAL_TABLET | Freq: Every day | ORAL | Status: DC | PRN

## 2018-05-12 MED ORDER — DEXAMETHASONE SODIUM PHOSPHATE 10 MG/ML IJ SOLN
INTRAMUSCULAR | Status: DC | PRN
  Administered 2018-05-12: 10 mg via INTRAVENOUS

## 2018-05-12 MED ORDER — THROMBIN 20000 UNITS EX SOLR
CUTANEOUS | Status: AC
  Filled 2018-05-12: qty 20000

## 2018-05-12 MED ORDER — SUGAMMADEX SODIUM 200 MG/2ML IV SOLN
INTRAVENOUS | Status: DC | PRN
  Administered 2018-05-12: 100 mg via INTRAVENOUS

## 2018-05-12 MED ORDER — ROCURONIUM BROMIDE 50 MG/5ML IV SOSY
PREFILLED_SYRINGE | INTRAVENOUS | Status: AC
  Filled 2018-05-12: qty 10

## 2018-05-12 MED ORDER — CEFAZOLIN SODIUM-DEXTROSE 2-4 GM/100ML-% IV SOLN
2.0000 g | Freq: Three times a day (TID) | INTRAVENOUS | Status: AC
  Administered 2018-05-12 (×2): 2 g via INTRAVENOUS
  Filled 2018-05-12 (×2): qty 100

## 2018-05-12 MED ORDER — POLYETHYLENE GLYCOL 3350 17 G PO PACK: 17.0000 g | PACK | Freq: Every day | ORAL | Status: DC

## 2018-05-12 MED ORDER — BUPIVACAINE-EPINEPHRINE (PF) 0.25% -1:200000 IJ SOLN
INTRAMUSCULAR | Status: AC
  Filled 2018-05-12: qty 30

## 2018-05-12 MED ORDER — METOPROLOL TARTRATE 25 MG PO TABS: 25.0000 mg | ORAL_TABLET | Freq: Two times a day (BID) | ORAL | Status: DC

## 2018-05-12 MED ORDER — ETOMIDATE 2 MG/ML IV SOLN
INTRAVENOUS | Status: DC | PRN
  Administered 2018-05-12: 20 mg via INTRAVENOUS

## 2018-05-12 MED ORDER — LIDOCAINE 2% (20 MG/ML) 5 ML SYRINGE
INTRAMUSCULAR | Status: AC
  Filled 2018-05-12: qty 5

## 2018-05-12 MED ORDER — FENTANYL CITRATE (PF) 100 MCG/2ML IJ SOLN: 25.0000 ug | INTRAMUSCULAR | Status: DC | PRN

## 2018-05-12 MED ORDER — ALBUTEROL SULFATE HFA 108 (90 BASE) MCG/ACT IN AERS: 1.0000 | INHALATION_SPRAY | Freq: Four times a day (QID) | RESPIRATORY_TRACT | Status: DC | PRN

## 2018-05-12 MED ORDER — KCL IN DEXTROSE-NACL 20-5-0.45 MEQ/L-%-% IV SOLN
INTRAVENOUS | Status: DC
  Administered 2018-05-12: 18:00:00 via INTRAVENOUS
  Filled 2018-05-12: qty 1000

## 2018-05-12 MED ORDER — THROMBIN 20000 UNITS EX SOLR
CUTANEOUS | Status: DC | PRN
  Administered 2018-05-12: 08:00:00

## 2018-05-12 MED ORDER — OXYCODONE HCL 5 MG PO TABS: 5.0000 mg | ORAL_TABLET | ORAL | Status: DC | PRN

## 2018-05-12 MED ORDER — MIDAZOLAM HCL 5 MG/5ML IJ SOLN
INTRAMUSCULAR | Status: DC | PRN
  Administered 2018-05-12: 1 mg via INTRAVENOUS

## 2018-05-12 MED ORDER — BISACODYL 5 MG PO TBEC: 5.0000 mg | DELAYED_RELEASE_TABLET | Freq: Every day | ORAL | Status: DC | PRN

## 2018-05-12 MED ORDER — MENTHOL 3 MG MT LOZG: 1.0000 | LOZENGE | OROMUCOSAL | Status: DC | PRN

## 2018-05-12 MED ORDER — PANTOPRAZOLE SODIUM 40 MG PO TBEC
40.0000 mg | DELAYED_RELEASE_TABLET | Freq: Every day | ORAL | Status: DC
  Administered 2018-05-12 – 2018-05-13 (×2): 40 mg via ORAL
  Filled 2018-05-12 (×2): qty 1

## 2018-05-12 MED ORDER — ONDANSETRON HCL 4 MG/2ML IJ SOLN: 4.0000 mg | Freq: Four times a day (QID) | INTRAMUSCULAR | Status: DC | PRN

## 2018-05-12 MED ORDER — EPHEDRINE SULFATE-NACL 50-0.9 MG/10ML-% IV SOSY
PREFILLED_SYRINGE | INTRAVENOUS | Status: DC | PRN
  Administered 2018-05-12: 5 mg via INTRAVENOUS
  Administered 2018-05-12: 10 mg via INTRAVENOUS
  Administered 2018-05-12 (×2): 5 mg via INTRAVENOUS

## 2018-05-12 MED ORDER — ONDANSETRON HCL 4 MG/2ML IJ SOLN
INTRAMUSCULAR | Status: AC
  Filled 2018-05-12: qty 2

## 2018-05-12 MED ORDER — KETAMINE HCL 10 MG/ML IJ SOLN
INTRAMUSCULAR | Status: DC | PRN
  Administered 2018-05-12: 20 mg via INTRAVENOUS
  Administered 2018-05-12: 10 mg via INTRAVENOUS

## 2018-05-12 MED ORDER — MIDAZOLAM HCL 2 MG/2ML IJ SOLN
INTRAMUSCULAR | Status: AC
  Filled 2018-05-12: qty 2

## 2018-05-12 MED ORDER — LACTATED RINGERS IV SOLN
INTRAVENOUS | Status: DC
  Administered 2018-05-12: 07:00:00 via INTRAVENOUS

## 2018-05-12 MED ORDER — ONDANSETRON HCL 4 MG/2ML IJ SOLN
INTRAMUSCULAR | Status: DC | PRN
  Administered 2018-05-12: 4 mg via INTRAVENOUS

## 2018-05-12 MED ORDER — PHENOL 1.4 % MT LIQD: 1.0000 | OROMUCOSAL | Status: DC | PRN

## 2018-05-12 MED ORDER — FENTANYL CITRATE (PF) 100 MCG/2ML IJ SOLN
INTRAMUSCULAR | Status: DC | PRN
  Administered 2018-05-12: 100 ug via INTRAVENOUS

## 2018-05-12 MED ORDER — SCOPOLAMINE 1 MG/3DAYS TD PT72
1.0000 | MEDICATED_PATCH | TRANSDERMAL | Status: DC
  Administered 2018-05-12: 1.5 mg via TRANSDERMAL
  Filled 2018-05-12: qty 1

## 2018-05-12 MED ORDER — DOCUSATE SODIUM 100 MG PO CAPS
100.0000 mg | ORAL_CAPSULE | Freq: Two times a day (BID) | ORAL | Status: DC
  Administered 2018-05-12: 100 mg via ORAL
  Filled 2018-05-12 (×2): qty 1

## 2018-05-12 MED ORDER — DEXAMETHASONE SODIUM PHOSPHATE 10 MG/ML IJ SOLN
INTRAMUSCULAR | Status: AC
  Filled 2018-05-12: qty 1

## 2018-05-12 MED ORDER — CEFAZOLIN SODIUM-DEXTROSE 2-4 GM/100ML-% IV SOLN
2.0000 g | INTRAVENOUS | Status: AC
  Administered 2018-05-12: 2 g via INTRAVENOUS
  Filled 2018-05-12: qty 100

## 2018-05-12 MED ORDER — MAGNESIUM CITRATE PO SOLN: 1.0000 | Freq: Once | ORAL | Status: DC | PRN

## 2018-05-12 MED ORDER — ACETAMINOPHEN 650 MG RE SUPP: 650.0000 mg | RECTAL | Status: DC | PRN

## 2018-05-12 MED ORDER — BUPIVACAINE-EPINEPHRINE 0.5% -1:200000 IJ SOLN
INTRAMUSCULAR | Status: DC | PRN
  Administered 2018-05-12: 12 mL

## 2018-05-12 MED ORDER — ALUM & MAG HYDROXIDE-SIMETH 200-200-20 MG/5ML PO SUSP: 30.0000 mL | Freq: Four times a day (QID) | ORAL | Status: DC | PRN

## 2018-05-12 MED ORDER — HYDROMORPHONE HCL 1 MG/ML IJ SOLN: 0.5000 mg | INTRAMUSCULAR | Status: DC | PRN

## 2018-05-12 MED ORDER — ISOSORBIDE MONONITRATE ER 30 MG PO TB24
30.0000 mg | ORAL_TABLET | Freq: Every day | ORAL | Status: DC
  Administered 2018-05-12 – 2018-05-13 (×2): 30 mg via ORAL
  Filled 2018-05-12 (×2): qty 1

## 2018-05-12 MED ORDER — RISAQUAD PO CAPS
1.0000 | ORAL_CAPSULE | Freq: Every day | ORAL | Status: DC
  Administered 2018-05-12 – 2018-05-13 (×2): 1 via ORAL
  Filled 2018-05-12 (×2): qty 1

## 2018-05-12 SURGICAL SUPPLY — 56 items
BAG DECANTER FOR FLEXI CONT (MISCELLANEOUS) ×2 IMPLANT
CLOTH 2% CHLOROHEXIDINE 3PK (PERSONAL CARE ITEMS) ×2 IMPLANT
CONT SPEC 4OZ CLIKSEAL STRL BL (MISCELLANEOUS) ×2 IMPLANT
COVER WAND RF STERILE (DRAPES) ×1 IMPLANT
DRAPE LAPAROTOMY 100X72X124 (DRAPES) ×2 IMPLANT
DRAPE MICROSCOPE LEICA (MISCELLANEOUS) ×2 IMPLANT
DRAPE SHEET LG 3/4 BI-LAMINATE (DRAPES) ×2 IMPLANT
DRAPE SURG 17X11 SM STRL (DRAPES) ×2 IMPLANT
DRAPE UTILITY XL STRL (DRAPES) ×2 IMPLANT
DRSG AQUACEL AG ADV 3.5X 4 (GAUZE/BANDAGES/DRESSINGS) IMPLANT
DRSG AQUACEL AG ADV 3.5X 6 (GAUZE/BANDAGES/DRESSINGS) ×1 IMPLANT
DRSG TELFA 3X8 NADH (GAUZE/BANDAGES/DRESSINGS) IMPLANT
DURAPREP 26ML APPLICATOR (WOUND CARE) ×2 IMPLANT
DURASEAL SPINE SEALANT 3ML (MISCELLANEOUS) IMPLANT
ELECT BLADE 4.0 EZ CLEAN MEGAD (MISCELLANEOUS) ×2
ELECT REM PT RETURN 9FT ADLT (ELECTROSURGICAL) ×2
ELECTRODE BLDE 4.0 EZ CLN MEGD (MISCELLANEOUS) IMPLANT
ELECTRODE REM PT RTRN 9FT ADLT (ELECTROSURGICAL) ×1 IMPLANT
GLOVE BIOGEL PI IND STRL 7.0 (GLOVE) ×1 IMPLANT
GLOVE BIOGEL PI IND STRL 8 (GLOVE) IMPLANT
GLOVE BIOGEL PI INDICATOR 7.0 (GLOVE) ×1
GLOVE BIOGEL PI INDICATOR 8 (GLOVE) ×5
GLOVE SURG SS PI 7.5 STRL IVOR (GLOVE) ×2 IMPLANT
GLOVE SURG SS PI 8.0 STRL IVOR (GLOVE) ×4 IMPLANT
GOWN STRL REUS W/ TWL LRG LVL3 (GOWN DISPOSABLE) ×1 IMPLANT
GOWN STRL REUS W/ TWL XL LVL3 (GOWN DISPOSABLE) ×1 IMPLANT
GOWN STRL REUS W/TWL LRG LVL3 (GOWN DISPOSABLE) ×2
GOWN STRL REUS W/TWL XL LVL3 (GOWN DISPOSABLE) ×4
IV CATH 14GX2 1/4 (CATHETERS) ×2 IMPLANT
KIT BASIN OR (CUSTOM PROCEDURE TRAY) ×2 IMPLANT
KIT POSITION SURG JACKSON T1 (MISCELLANEOUS) ×2 IMPLANT
NDL SPNL 18GX3.5 QUINCKE PK (NEEDLE) ×2 IMPLANT
NEEDLE 22X1 1/2 (OR ONLY) (NEEDLE) ×2 IMPLANT
NEEDLE SPNL 18GX3.5 QUINCKE PK (NEEDLE) ×4 IMPLANT
PACK LAMINECTOMY NEURO (CUSTOM PROCEDURE TRAY) ×2 IMPLANT
PAD DRESSING TELFA 3X8 NADH (GAUZE/BANDAGES/DRESSINGS) IMPLANT
PATTIES SURGICAL .75X.75 (GAUZE/BANDAGES/DRESSINGS) ×2 IMPLANT
RUBBERBAND STERILE (MISCELLANEOUS) ×4 IMPLANT
SPONGE LAP 4X18 RFD (DISPOSABLE) IMPLANT
SPONGE SURGIFOAM ABS GEL 100 (HEMOSTASIS) ×2 IMPLANT
STAPLER VISISTAT (STAPLE) ×1 IMPLANT
STRIP CLOSURE SKIN 1/2X4 (GAUZE/BANDAGES/DRESSINGS) ×2 IMPLANT
SUT NURALON 4 0 TR CR/8 (SUTURE) IMPLANT
SUT PROLENE 3 0 PS 2 (SUTURE) IMPLANT
SUT VIC AB 1 CT1 27 (SUTURE) ×4
SUT VIC AB 1 CT1 27XBRD ANBCTR (SUTURE) IMPLANT
SUT VIC AB 1 CT1 27XBRD ANTBC (SUTURE) IMPLANT
SUT VIC AB 1-0 CT2 27 (SUTURE) IMPLANT
SUT VIC AB 2-0 CT1 27 (SUTURE)
SUT VIC AB 2-0 CT1 TAPERPNT 27 (SUTURE) IMPLANT
SUT VIC AB 2-0 CT2 27 (SUTURE) ×1 IMPLANT
SYR 3ML LL SCALE MARK (SYRINGE) ×2 IMPLANT
TOWEL GREEN STERILE (TOWEL DISPOSABLE) ×2 IMPLANT
TOWEL GREEN STERILE FF (TOWEL DISPOSABLE) ×2 IMPLANT
TRAY CATH 16FR W/PLASTIC CATH (SET/KITS/TRAYS/PACK) ×1 IMPLANT
YANKAUER SUCT BULB TIP NO VENT (SUCTIONS) ×2 IMPLANT

## 2018-05-12 NOTE — Transfer of Care (Signed)
Immediate Anesthesia Transfer of Care Note  Patient: Daniel Carey  Procedure(s) Performed: Microlumbar decompression L2-3, possible L1-2 (N/A Spine Lumbar)  Patient Location: PACU  Anesthesia Type:General  Level of Consciousness: awake, alert  and oriented  Airway & Oxygen Therapy: Patient Spontanous Breathing and Patient connected to face mask oxygen  Post-op Assessment: Report given to RN and Post -op Vital signs reviewed and stable  Post vital signs: Reviewed and stable  Last Vitals:  Vitals Value Taken Time  BP 172/67 05/12/2018 10:08 AM  Temp    Pulse 37 05/12/2018 10:13 AM  Resp 17 05/12/2018 10:13 AM  SpO2 100 % 05/12/2018 10:13 AM  Vitals shown include unvalidated device data.  Last Pain:  Vitals:   05/12/18 0658  TempSrc:   PainSc: 3       Patients Stated Pain Goal: 2 (70/23/01 7209)  Complications: No apparent anesthesia complications

## 2018-05-12 NOTE — Discharge Instructions (Signed)

## 2018-05-12 NOTE — Anesthesia Postprocedure Evaluation (Signed)
Anesthesia Post Note  Patient: Daniel Carey  Procedure(s) Performed: Microlumbar decompression L2-3, possible L1-2 (N/A Spine Lumbar)     Patient location during evaluation: PACU Anesthesia Type: General Level of consciousness: sedated Pain management: pain level controlled Vital Signs Assessment: post-procedure vital signs reviewed and stable Respiratory status: spontaneous breathing and respiratory function stable Cardiovascular status: stable Postop Assessment: no apparent nausea or vomiting Anesthetic complications: no    Last Vitals:  Vitals:   05/12/18 1123 05/12/18 1138  BP: 139/64 131/62  Pulse: (!) 33 (!) 33  Resp: 12 12  Temp:    SpO2: 98% 98%    Last Pain:  Vitals:   05/12/18 1138  TempSrc:   PainSc: 0-No pain                 Tunis Gentle DANIEL

## 2018-05-12 NOTE — Interval H&P Note (Signed)
History and Physical Interval Note:  05/12/2018 7:05 AM  Daniel Carey  has presented today for surgery, with the diagnosis of HNP L2-3  The various methods of treatment have been discussed with the patient and family. After consideration of risks, benefits and other options for treatment, the patient has consented to  Procedure(s) with comments: Microlumbar decompression L2-3, possible L1-2 (N/A) - 2 hrs as a surgical intervention .  The patient's history has been reviewed, patient examined, no change in status, stable for surgery.  I have reviewed the patient's chart and labs.  Questions were answered to the patient's satisfaction.     Daniel Carey

## 2018-05-12 NOTE — Brief Op Note (Signed)
05/12/2018  5:15 PM  PATIENT:  [name]  68 y.o. male  PRE-OPERATIVE DIAGNOSIS:  HNP L2-3  POST-OPERATIVE DIAGNOSIS:  HNP L2-3  PROCEDURE:  Procedure(s) with comments: Microlumbar decompression L2-3, possible L1-2 (N/A) - 2 hrs, Microlumbar decompression L2-3, possible L1-2  SURGEON:  Surgeon(s) and Role:    Susa Day, MD - Primary  PHYSICIAN ASSISTANT:   ASSISTANTS: Bissell   ANESTHESIA:   general  EBL:  50 mL   BLOOD ADMINISTERED:none  DRAINS: none   LOCAL MEDICATIONS USED:  MARCAINE     SPECIMEN:  No Specimen  DISPOSITION OF SPECIMEN:  N/A  COUNTS:  YES  TOURNIQUET:  * No tourniquets in log *  DICTATION: .Other Dictation: Dictation Number [id number]  PLAN OF CARE: Admit for overnight observation  PATIENT DISPOSITION:  PACU - hemodynamically stable.   Delay start of Pharmacological VTE agent (>24hrs) due to surgical blood loss or risk of bleeding: yes

## 2018-05-12 NOTE — Progress Notes (Signed)
Dr. Tobias Alexander aware of heart rate in the 30's.

## 2018-05-12 NOTE — Op Note (Signed)
NAME: Daniel, Carey MEDICAL RECORD [id number] ACCOUNT [id number] DATE OF BIRTH:1951/02/16 FACILITY: [facility] LOCATION: [facility] PHYSICIAN:Marsa Matteo Windy Kalata, MD  OPERATIVE REPORT  DATE OF PROCEDURE:  05/12/2018  PREOPERATIVE DIAGNOSIS:  Spinal stenosis L2-3, epidural hematoma L2-3, disk herniation L2-3.  POSTOPERATIVE DIAGNOSIS:  Spinal stenosis L2-3, epidural hematoma L2-3, disk herniation L2-3, epidural lipomatosis.  PROCEDURE PERFORMED: 1.  Microlumbar decompression L2-3. 2.  Foraminotomies L3 bilaterally, L2 on the left. 3.  Evacuation of hematoma. 4.  Removal of epidural lipomatosis.    ANESTHESIA:  General.  ASSISTANT:  Lacie Draft, PA  HISTORY:  This is a 68 year old male who had an injury with acute radicular pain, severe.  He was noted on MRI to have severe stenosis at L2-3, disk herniation versus hematoma.  He underwent gadolinium to rule out a meningioma.  It was found not to be  that likely hematoma and disk herniation as well as underlying stenosis.  He had scoliosis.  The patient is on Plavix and aspirin had to come off of that.  He underwent preoperative clearance.  He had pain mainly down into the left leg, weakness in his  hip flexors, but no change in bowel or bladder function.  We discussed living with his symptoms versus microlumbar decompression.  He felt there was severe pain and due to the compression on the sac, we felt that it was prudent to proceed.  Risks and  benefits discussed including bleeding, infection, damage to neurovascular structures, no change in symptoms, worsening symptoms, DVT, PE, anesthetic complications, etc.  DESCRIPTION OF PROCEDURE:  With the patient in supine position, after induction of adequate general anesthesia, 2 grams Kefzol, placed prone on the [place] table.  Knees flexed in stirrups.  All bony prominences well padded.  Lumbar region was prepped  and draped in the usual sterile fashion.  Two 18-gauge spinal needles were utilized  to localize the 2-3 interspace confirmed with x-ray.  Incision was made from above the spinous process 2 below 3.  Subcutaneous tissue was dissected with electrocautery  to achieve hemostasis.  Marcaine 0.25% with epinephrine was infiltrated in the paraspinous musculature.  Dorsal lumbar fascia divided in line with skin incision.  Paraspinous muscle elevated from lamina of 2-3.  Operating microscope was draped more in  the surgical field.  Confirmatory radiograph obtained.  Leksell rongeur was utilized to remove the spinous processes of 2 and 3 as he had a very small interlaminar window with a rotational deformity due to his scoliosis.  We meticulously identified the  small interlaminar space.  We utilized a micro curette to detach ligamentum flavum from the cephalad edge of 3 and the caudad edge of 2.  I then noted the facet on the left and right inferior articulating process.  I then used a 2 mm Kerrison to begin  performing a central laminotomy cephalad.  Then to the left with a Penfield 4 to detach the final layer of ligamentum from the lamina and to protect the 3 nerve root, I performed a generous 3 foraminotomy on the left and then on the right.  Ligamentum  flavum were removed from the interspace after I was able to see the epidural fat.  As we removed the ligamentum flavum, a copious portion of the epidural fat was exuded from the laminotomy.  There was a small amount of epidural blood noted on the left.   Continuing cephalad bilaterally with hemilaminotomies to the point detaching the ligamentum flavum.  I then gently removed the ligamentum flavum from the  lateral recesses bilaterally, although on the left it was fairly stenotic.  First above the 3 root  and the lateral recess and out the foramen of the 2 and then after mobilizing the 3 root on the left, decompressing the lateral recess on the left to the medial border of the pedicle.  A very small portion of the disk off the left was evacuated.    Following this, a neuro probe passed through freely out the foramen with 2 and 3.  The 3 root was edematous and erythematous.  There was ligamentum flavum hypertrophy, facet hypertrophy and epidural lipomatosis mainly with a small amount of epidural  blood on the left.  I probed the disk space.  There was no disk herniation coming from the disk space itself.  Both left and right there was good restoration of the thecal sac.  No active bleeding or CSF leakage.  Confirmatory radiograph obtained with a  marker in the foramen of 2 and 3.  Good restoration of the thecal sac.  The pars was intact.  I felt this was adequately satisfactory decompress the stenosis was noted.  Good epidural fat cephalad and a probe passed cephalad to above the pedicle of 2 and  below the pedicle of 3.  I then placed bone wax on the cancellous surfaces and copiously irrigated with antibiotic irrigation.  No evidence of CSF leakage or active bleeding.  Bipolar electrocautery had been utilized to achieve hemostasis as was bone  was.  A very small portion of a 2 mm x 2 mm thrombin-soaked Gelfoam was placed in the lateral recess on the left.  I removed the McCullough retractor, irrigated the paraspinous musculature.  No active bleeding.  I closed the dorsal lumbar fascia using #1  Vicryl interrupted figure-of-eight sutures of that left 2 apertures of approximately a centimeter on the cephalad edge of the caudad edge of the wound for aperture for drainage if needed.  Subcutaneous again loosely closed with 2-0 irrigated and the  skin with staples.  The wound was dressed sterilely, placed supine on the hospital bed, extubated without difficulty, in and out catheter.  Then to the recovery room in satisfactory condition.  The patient tolerated the procedure well.  No complications.  Assistant was [employer], [place].  Minimal blood loss of 50 mL.  TN/NUANCE  D:05/12/2018 T:05/12/2018 JOB:[id number]

## 2018-05-12 NOTE — Progress Notes (Signed)
Patient arrived to the unit alert and oriented X 4. Denies pain Surgical incision on lower back. UTA. Dressing in place clean dry and intact Placed on Tele box #2 Wife is at bedside. All questions and concerns addressed bed in the lowest position with bed alarm on. Call bell in reached.

## 2018-05-12 NOTE — Consult Note (Addendum)
Cardiology Consultation:   Patient ID: Daniel Carey MRN: [id number]; DOB: Jul 08, 1950  Admit date: 05/12/2018 Date of Consult: 05/12/2018  Primary Care Provider: Dione Housekeeper, MD Primary Cardiologist: Mertie Moores, MD  Primary Electrophysiologist:  None     Patient Profile:   Daniel Carey is a 68 y.o. male with a hx of CAD w/ RCA stent, AV block Mobitz 1, h/o bradycardia w/ anesthesia in the past, OSA, HTN, HLD and COPD, who is being seen today for the evaluation of post operative bradycardia after elective back surgery, at the request of Dr. [facility].   History of Present Illness:   Daniel Carey is followed by Dr. Acie Fredrickson. He has known CAD with prior RCA stenting. His most recent cardiac cath was in 2018. Cath showed multivessel CAD including diffuse calcified LAD disease of up to 60-70% (FFR 0.73), moderate to severe LCx and OM3 disease (FFR 0.83 at submaximal hyperemia), and moderate in-stent restenosis of mid RCA. Medical therapy was elected and LA nitrate was added, with recs to consider atherectomy and PCI to LAD (would require multiple stents) versus CABG if uncontrolled symptoms. He has done well since w/o recurrent angina. He has a h/o normal LVF. Echo in 2017 showed EF to be 60-65%. No significant valvular abnormalities. G1DD was noted at that time. Additional medical problems include OSA compliant w/ CPAP, HTN, HLD, COPD and 1st degree AV block. It is also noted that pt has a h/o profound bradycardia after receiving propofol for endoscopy in the past.    Pt was admitted for L2-L3 decompression for HNP. General anesthesia was used. I cannot find exact agent used in anesthesia notes, but pt states he was not given propofol. Post surgery, pt transferred to PACU and noted to be bradycardic w/ HR in the 30s. 2:1 AV block. Review of prior EKGs show that he has an average resting HR in the 60s.  Pt's outpatient med regimen includes metoprolol tartrate 25 mg once daily. His last  dose was this morning at 4:30. Pt also reports a change in his usual baseline HR. Over the last 2 weeks, he has been checking is BP at home and HR readings on BP monitor have been as low as the 40s, but pt has been asymptomatic. He denies dizziness, syncope/ near syncope, fatigue, CP and dyspnea. Given his lower HRs, he self discontinued his evening dose of metoprolol and has only been taking in the mornings.   He is currently resting in bed. Wife present at bedside. He feels fine. No current cardiac symptoms.    Past Medical History:  Diagnosis Date  . 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 CAD (coronary artery disease)    moderate to severe distal CAD with large epicardial arteries having only mild to moderate irregularities. Last cath in Dec 2006; s/p cath with Promus DES in distal RCA and Promus DES in the mid vessel in an overlapping fashion  . Chest pain   . Complication of anesthesia    heart rate dropping with propofol last procedure  . COPD (chronic obstructive pulmonary disease) ([place])   . ED (erectile dysfunction)   . Esophageal stricture   . External hemorrhoids without mention of complication   . GERD (gastroesophageal reflux disease)   . Headache(784.0)   . Hiatal hernia   . History of kidney stones   . HLD (hyperlipidemia)   . HTN (hypertension)   . Myocardial infarction ([place])    1996  .  Personal history of colonic polyps 03/12/2010   TUBULAR ADENOMA  . Tobacco abuse     Past Surgical History:  Procedure Laterality Date  . ANGIOPLASTY     X3  . COLONOSCOPY N/A 02/16/2017   Procedure: COLONOSCOPY;  Surgeon: Jerene Bears, MD;  Location: [facility];  Service: Gastroenterology;  Laterality: N/A;  . LEFT HEART CATH AND CORONARY ANGIOGRAPHY N/A 11/05/2016   Procedure: Left Heart Cath and Coronary Angiography;  Surgeon: Nelva Bush, MD;  Location: [facility];  Service: Cardiovascular;  Laterality: N/A;  . PERCUTANEOUS CORONARY ROTOBLATOR  INTERVENTION (PCI-R) N/A 06/30/2012   Procedure: PERCUTANEOUS CORONARY ROTOBLATOR INTERVENTION (PCI-R);  Surgeon: Burnell Blanks, MD;  Location: [facility] CATH LAB;  Service: Cardiovascular;  Laterality: N/A;  . VIDEO BRONCHOSCOPY  08/13/2011   Procedure: VIDEO BRONCHOSCOPY WITHOUT FLUORO;  Surgeon: Tanda Rockers, MD;  Location: [facility];  Service: Cardiopulmonary;  Laterality: Bilateral;     Home Medications:  Prior to Admission medications   Medication Sig Start Date End Date Taking? Authorizing Provider  acetaminophen (TYLENOL) 500 MG tablet Take 500 mg by mouth every 6 (six) hours as needed (pain).   Yes [provider]  aspirin EC 81 MG tablet Take 81 mg by mouth every evening.    Yes [provider]  Choline Fenofibrate (FENOFIBRIC ACID) 135 MG CPDR Take 1 tablet by mouth daily. Patient taking differently: Take 135 mg by mouth every evening.  11/22/17  Yes Nahser, Wonda Cheng, MD  clopidogrel (PLAVIX) 75 MG tablet Take 75 mg by mouth daily.   Yes [provider]  famotidine (PEPCID) 20 MG tablet Take 20 mg by mouth daily as needed for heartburn.   Yes [provider]  hydrochlorothiazide (HYDRODIURIL) 25 MG tablet TAKE 0.5 TABLETS (12.5 MG TOTAL) BY MOUTH DAILY. 10/22/17  Yes Nahser, Wonda Cheng, MD  isosorbide mononitrate (IMDUR) 30 MG 24 hr tablet Take 1 tablet (30 mg total) by mouth daily. Patient taking differently: Take 30 mg by mouth every evening.  03/14/18  Yes End, Harrell Gave, MD  metoprolol tartrate (LOPRESSOR) 25 MG tablet TAKE 1 TABLET BY MOUTH TWICE A DAY Patient taking differently: Take 25 mg by mouth 2 (two) times daily.  10/22/17  Yes Nahser, Wonda Cheng, MD  PROAIR HFA 108 [phone] Base) MCG/ACT inhaler NHALE 2 PUFFS INTO THE LUNGS EVERY 6 (SIX) HOURS AS NEEDED FOR WHEEZING OR SHORTNESS OF BREATH. 05/04/18  Yes Nahser, Wonda Cheng, MD  RABEprazole (ACIPHEX) 20 MG tablet Take 20 mg by mouth daily at 3 pm.    Yes [provider]  rosuvastatin  (CRESTOR) 20 MG tablet TAKE 1 TABLET BY MOUTH EVERY DAY Patient taking differently: Take 20 mg by mouth every evening.  12/24/17  Yes Nahser, Wonda Cheng, MD  naproxen sodium (ANAPROX) 220 MG tablet Take 220 mg by mouth 2 (two) times daily as needed (for headache).     [provider]  nitroGLYCERIN (NITROSTAT) 0.4 MG SL tablet Place 1 tablet (0.4 mg total) under the tongue every 5 (five) minutes as needed for chest pain. 11/04/16   Nahser, Wonda Cheng, MD    Inpatient Medications: Scheduled Meds: . acidophilus  1 capsule Oral Daily  . docusate sodium  100 mg Oral BID  . isosorbide mononitrate  30 mg Oral Daily  . [START ON 05/13/2018] metoprolol tartrate  25 mg Oral BID  . pantoprazole  40 mg Oral Daily  . [START ON 05/13/2018] polyethylene glycol  17 g Oral Daily  Continuous Infusions: .  ceFAZolin (ANCEF) IV    . dextrose 5 % and 0.45 % NaCl with KCl 20 mEq/L    . methocarbamol (ROBAXIN) IV     PRN Meds: acetaminophen **OR** acetaminophen, albuterol, alum & mag hydroxide-simeth, bisacodyl, famotidine, HYDROmorphone (DILAUDID) injection, magnesium citrate, menthol-cetylpyridinium **OR** phenol, methocarbamol **OR** methocarbamol (ROBAXIN) IV, nitroGLYCERIN, ondansetron **OR** ondansetron (ZOFRAN) IV, oxyCODONE  Allergies:    Allergies  Allergen Reactions  . Lisinopril Cough and Rash  . Amlodipine Besylate Other (See Comments)    Headache, weakness, dizziness  . Propofol Other (See Comments)    Heart rate dropped    Social History:   Social History   Socioeconomic History  . Marital status: Married    Spouse name: Not on file  . Number of children: 1  . Years of education: Not on file  . Highest education level: Not on file  Occupational History  . Occupation: Radio broadcast assistant  Social Needs  . Financial resource strain: Not on file  . Food insecurity:    Worry: Not on file    Inability: Not on file  . Transportation needs:    Medical: Not on file     Non-medical: Not on file  Tobacco Use  . Smoking status: Former Smoker    Packs/day: 0.50    Years: 40.00    Pack years: 20.00    Types: Cigarettes    Last attempt to quit: 08/25/2011    Years since quitting: 6.7  . Smokeless tobacco: Never Used  Substance and Sexual Activity  . Alcohol use: Yes    Comment: rare  . Drug use: No  . Sexual activity: Not Currently  Lifestyle  . Physical activity:    Days per week: Not on file    Minutes per session: Not on file  . Stress: Not on file  Relationships  . Social connections:    Talks on phone: Not on file    Gets together: Not on file    Attends religious service: Not on file    Active member of club or organization: Not on file    Attends meetings of clubs or organizations: Not on file    Relationship status: Not on file  . Intimate partner violence:    Fear of current or ex partner: Not on file    Emotionally abused: Not on file    Physically abused: Not on file    Forced sexual activity: Not on file  Other Topics Concern  . Not on file  Social History Narrative  . Not on file    Family History:    Family History  Problem Relation Age of Onset  . Heart disease Father        hx of MI  . Diabetes Mother        and brother   . Kidney failure Mother   . Breast cancer Sister   . Colon cancer Neg Hx   . Stomach cancer Neg Hx      ROS:  Please see the history of present illness.   All other ROS reviewed and negative.     Physical Exam/Data:   Vitals:   05/12/18 1138 05/12/18 1153 05/12/18 1208 05/12/18 1230  BP: 131/62 (!) 142/61 (!) 143/53 121/68  Pulse: (!) 33 (!) 35 (!) 38 (!) 42  Resp: 12 11 19 18   Temp:   (!) 97.2 F (36.2 C) (!) 97.5 F (36.4 C)  TempSrc:    Oral  SpO2: 98% 98%  100% 100%  Weight:      Height:        Intake/Output Summary (Last 24 hours) at 05/12/2018 1408 Last data filed at 05/12/2018 1005 Gross per 24 hour  Intake 950 ml  Output 300 ml  Net 650 ml   Last 3 Weights 05/12/2018  05/10/2018 01/31/2018  Weight (lbs) 185 lb 185 lb 12.8 oz 192 lb  Weight (kg) 83.915 kg 84.278 kg 87.091 kg     Body mass index is 27.32 kg/m.  General:  Well nourished, well developed, in no acute distress HEENT: normal Lymph: no adenopathy Neck: no JVD Endocrine:  No thryomegaly Vascular: No carotid bruits; FA pulses 2+ bilaterally without bruits  Cardiac:  Regular rhythm, slow rate Lungs:  clear to auscultation bilaterally, no wheezing, rhonchi or rales  Abd: soft, nontender, no hepatomegaly  Ext: no edema Musculoskeletal:  No deformities, BUE and BLE strength normal and equal Skin: warm and dry  Neuro:  CNs 2-12 intact, no focal abnormalities noted Psych:  Normal affect   EKG:  The EKG was personally reviewed and demonstrates:  Sinus bradycardia 2:1 AV block  Telemetry:  Telemetry was personally reviewed and demonstrates:  Sinus bradycardia 40s   Relevant CV Studies: [facility] 10/2016 Procedures   Left Heart Cath and Coronary Angiography  Conclusion   Conclusions: 1. Multivessel coronary artery disease, including diffuse calcified LAD disease of up to 60-70% (FFR 0.73), moderate to severe LCx and OM3 disease (FFR 0.83 at submaximal hyperemia), and moderate in-stent restenosis of mid RCA. 2. Normal left ventricular filling pressure. 3. Normal left ventricular systolic function.  Recommendations: 1. Escalate antianginal therapy with addition of isosorbide mononitrate. 2. Continue aggressive secondary prevention, including dual antiplatelet and statin therapy. 3. I will review the films with the intervention and cardiac surgery teams. Revascularization options include atherectomy and PCI to LAD (would require multiple stents) versus CABG. 4. Follow-up with Dr. Acie Fredrickson in approximately 2 weeks to reevaluate symptoms after addition of long-acting nitrate, as well as to readdress revascularization options.  Nelva Bush, MD    2D Echo 07/2015 Study Conclusions  - Left  ventricle: The cavity size was normal. Systolic function was   normal. The estimated ejection fraction was in the range of 60%   to 65%. Doppler parameters are consistent with abnormal left   ventricular relaxation (grade 1 diastolic dysfunction).   Laboratory Data:  Chemistry Recent Labs  Lab 05/10/18 1600  NA 138  K 4.5  CL 104  CO2 26  GLUCOSE 101*  BUN 19  CREATININE 1.40*  CALCIUM 9.5  GFRNONAA 52*  GFRAA >60  ANIONGAP 8    No results for input(s): PROT, ALBUMIN, AST, ALT, ALKPHOS, BILITOT in the last 168 hours. Hematology Recent Labs  Lab 05/10/18 1600  WBC 8.4  RBC 4.69  HGB 14.2  HCT 43.0  MCV 91.7  MCH 30.3  MCHC 33.0  RDW 13.2  PLT 510*   Cardiac EnzymesNo results for input(s): TROPONINI in the last 168 hours. No results for input(s): TROPIPOC in the last 168 hours.  BNPNo results for input(s): BNP, PROBNP in the last 168 hours.  DDimer No results for input(s): DDIMER in the last 168 hours.  Radiology/Studies:  Dg Lumbar Spine 2-3 Views  Result Date: 05/12/2018 CLINICAL DATA:  Lumbar spine surgery, L2-L3 micro lumbar decompression, possible L1-L2 EXAM: LUMBAR SPINE - 2-3 VIEW COMPARISON:  05/10/2018 FINDINGS: Five lumbar vertebra labeled on previous exam. Image #1 at 2778 hours: Metallic probes via dorsal approach project  dorsal to the mid L2 and mid L3 levels. Bones appear demineralized with mild disc space narrowing and endplate spur formation throughout lumbar spine. Image #2 at 0806 hours: Tissue spreader and surgical instruments present dorsal to L3 vertebra. Image #3 at 0919 hours: 2 crossed bent metallic probes are identified, projecting dorsal to the inferior L2 and superior L3 levels. IMPRESSION: Intraoperative lumbar localization images as above. Electronically Signed   By: Lavonia Dana M.D.   On: 05/12/2018 10:55   Dg Lumbar Spine 2-3 Views  Result Date: 05/11/2018 CLINICAL DATA:  Preop lumbar spine surgery. EXAM: LUMBAR SPINE - 2-3 VIEW  COMPARISON:  CT abdomen/pelvis 04/14/2017 FINDINGS: There are 5 nonrib bearing lumbar-type vertebral bodies. The vertebral body heights are maintained. There is generalized osteopenia. There is a dextroscoliosis of the thoracolumbar spine. There is no static listhesis. There is no spondylolysis. There is no acute fracture. There is degenerative disc disease with disc height loss at L2-3, L3-4, L4-5 and L5-S1. There is bilateral facet arthropathy throughout the lumbar spine most severe at L4-5 and L5-S1. There is mild osteoarthritis of bilateral sacroiliac joints. There is abdominal aortic atherosclerosis. IMPRESSION: Lumbar spine spondylosis as described above. Electronically Signed   By: Kathreen Devoid   On: 05/11/2018 08:20    Assessment and Plan:   Daniel Carey is a 68 y.o. male with a hx of CAD w/ RCA stent, AV block Mobitz 1, h/o bradycardia w/ anesthesia in the past, OSA, HTN, HLD and COPD, who is being seen today for the evaluation of post operative bradycardia after elective back surgery, at the request of Dr. [facility].   1. Bradycardia: 2:1 AV block. HR in the 30s post op, but now in the 70s. This is in the setting of recent surgery under general anesthesia. Per records, he has a h/o bradycardia in the past w/ propofol used for endoscopy. Propofol was not used today. Pt also has baseline HR in the 60s based on prior EKGs but has noticed home HRs in the 40s for the last 2 weeks.  He is also on an oral beta blocker as an outpatient. He is on metoprolol 25 mg. He was taking this BID but recent redcued dose down to once daily due to lower resting HR. His last dose of metoprolol was this morning around 4:30 AM. He is asymptomatic w/ his bradycardia. We recommend overnight observation on tele. We will hold metoprolol for now and will reassess in the AM. When cleared to ambulate from an ortho prospective, we would like to see how his HR responds to ambulation to ensure no chronotropic incompetence.  We can also consider outpatient monitor once discharged for further observation to ensure no development of high grade AV block.   2. Ortho: s/p L2-L3 decompression for HNP. Post operative management per IM.   3. CAD: prior h/o RCA stent. Most recent cath in 2018 showed multivessel CAD including diffuse calcified LAD disease of up to 60-70% (FFR 0.73), moderate to severe LCx and OM3 disease (FFR 0.83 at submaximal hyperemia), and moderate in-stent restenosis of mid RCA. Medical therapy was elected and LA nitrate was added, with recs to consider atherectomy and PCI to LAD (would require multiple stents) versus CABG if uncontrolled symptoms. He has done well w/ medical therapy. He denies any recent angina. His Plavix and ASA have been on hold due to surgery. We will plan to resume, once safe to do so from a surgical standpoint. Will hold  blocker due to bradycardia.  Continue statin therapy, Crestor 20 mg.   4. HTN: normotensive. Hold metoprolol for now given bradycardia.   5. HLD: will resume home statin, Crestor 20 mg nightly.   6. OSA: CPAP nightly.   For questions or updates, please contact [facility] Please consult [email or web address] for contact info under     Signed, Lyda Jester, PA-C  05/12/2018 2:08 PM   Attending Note:   The patient was seen and examined.  Agree with assessment and plan as noted above.  Changes made to the above note as needed.  Patient seen and independently examined with  Ellen Henri, PA .   We discussed all aspects of the encounter. I agree with the assessment and plan as stated above.  1.  Bradycardia: The patient presents with bradycardia with 2-1 AV block.  He is completely asymptomatic.  He is on a small dose of metoprolol. He had a surgery today and did well.  His heart rate is in the 50s now.  He denies any episodes of syncope or presyncope.  He has been on metoprolol for treatment of his coronary artery disease.  Fortunately, he is not had any  episodes of recent angina.  We will hold metoprolol for now.  We will reassess him in the morning.  Given his lack of symptoms, I do not think that he will necessarily need to have a pacemaker anytime soon.  I discussed with him the typical symptoms that he might experience if his heart rate slows further. We will check a TSH today. I anticipate that he will be able to be discharged tomorrow.  We will see him in the office.   I have spent a total of 40 minutes with patient reviewing hospital  notes , telemetry, EKGs, labs and examining patient as well as establishing an assessment and plan that was discussed with the patient. > 50% of time was spent in direct patient care.    Thayer Headings, Brooke Bonito., MD, [facility] 05/12/2018, 4:15 PM [address]  [place] Pager [phone]

## 2018-05-12 NOTE — Anesthesia Procedure Notes (Signed)
Procedure Name: Intubation Date/Time: 05/12/2018 7:41 AM Performed by: Trinna Post., CRNA Pre-anesthesia Checklist: Patient identified, Emergency Drugs available, Suction available, Patient being monitored and Timeout performed Patient Re-evaluated:Patient Re-evaluated prior to induction Oxygen Delivery Method: Circle system utilized Preoxygenation: Pre-oxygenation with 100% oxygen Induction Type: IV induction Ventilation: Mask ventilation without difficulty Laryngoscope Size: Mac and 4 Grade View: Grade I Tube type: Oral Tube size: 7.5 mm Number of attempts: 1 Airway Equipment and Method: Stylet Placement Confirmation: ETT inserted through vocal cords under direct vision,  positive ETCO2 and breath sounds checked- equal and bilateral Secured at: 23 cm Tube secured with: Tape Dental Injury: Teeth and Oropharynx as per pre-operative assessment

## 2018-05-13 ENCOUNTER — Encounter (HOSPITAL_COMMUNITY): Payer: Self-pay | Admitting: Specialist

## 2018-05-13 DIAGNOSIS — I252 Old myocardial infarction: Secondary | ICD-10-CM | POA: Diagnosis not present

## 2018-05-13 DIAGNOSIS — M48061 Spinal stenosis, lumbar region without neurogenic claudication: Secondary | ICD-10-CM | POA: Diagnosis not present

## 2018-05-13 DIAGNOSIS — I10 Essential (primary) hypertension: Secondary | ICD-10-CM | POA: Diagnosis not present

## 2018-05-13 DIAGNOSIS — Z7902 Long term (current) use of antithrombotics/antiplatelets: Secondary | ICD-10-CM | POA: Diagnosis not present

## 2018-05-13 DIAGNOSIS — E882 Lipomatosis, not elsewhere classified: Secondary | ICD-10-CM | POA: Diagnosis not present

## 2018-05-13 DIAGNOSIS — Z888 Allergy status to other drugs, medicaments and biological substances status: Secondary | ICD-10-CM | POA: Diagnosis not present

## 2018-05-13 DIAGNOSIS — E119 Type 2 diabetes mellitus without complications: Secondary | ICD-10-CM | POA: Diagnosis not present

## 2018-05-13 DIAGNOSIS — K219 Gastro-esophageal reflux disease without esophagitis: Secondary | ICD-10-CM | POA: Diagnosis not present

## 2018-05-13 DIAGNOSIS — J449 Chronic obstructive pulmonary disease, unspecified: Secondary | ICD-10-CM | POA: Diagnosis not present

## 2018-05-13 DIAGNOSIS — I2510 Atherosclerotic heart disease of native coronary artery without angina pectoris: Secondary | ICD-10-CM | POA: Diagnosis not present

## 2018-05-13 DIAGNOSIS — E785 Hyperlipidemia, unspecified: Secondary | ICD-10-CM | POA: Diagnosis not present

## 2018-05-13 DIAGNOSIS — Z87891 Personal history of nicotine dependence: Secondary | ICD-10-CM | POA: Diagnosis not present

## 2018-05-13 DIAGNOSIS — I441 Atrioventricular block, second degree: Secondary | ICD-10-CM | POA: Diagnosis not present

## 2018-05-13 DIAGNOSIS — Z79899 Other long term (current) drug therapy: Secondary | ICD-10-CM | POA: Diagnosis not present

## 2018-05-13 DIAGNOSIS — R001 Bradycardia, unspecified: Secondary | ICD-10-CM | POA: Diagnosis not present

## 2018-05-13 DIAGNOSIS — G4733 Obstructive sleep apnea (adult) (pediatric): Secondary | ICD-10-CM | POA: Diagnosis not present

## 2018-05-13 LAB — BASIC METABOLIC PANEL
Anion gap: 9 (ref 5–15)
BUN: 17 mg/dL (ref 8–23)
CO2: 23 mmol/L (ref 22–32)
Calcium: 9.2 mg/dL (ref 8.9–10.3)
Chloride: 101 mmol/L (ref 98–111)
Creatinine, Ser: 1.44 mg/dL — ABNORMAL HIGH (ref 0.61–1.24)
GFR calc Af Amer: 58 mL/min — ABNORMAL LOW (ref >60)
GFR calc non Af Amer: 50 mL/min — ABNORMAL LOW (ref >60)
Glucose, Bld: 172 mg/dL — ABNORMAL HIGH (ref 70–99)
Potassium: 4.9 mmol/L (ref 3.5–5.1)
Sodium: 133 mmol/L — ABNORMAL LOW (ref 135–145)

## 2018-05-13 MED ORDER — POLYETHYLENE GLYCOL 3350 17 G PO PACK: 17.0000 g | PACK | Freq: Every day | ORAL | 0 refills | Status: DC | PRN

## 2018-05-13 MED ORDER — CLOPIDOGREL BISULFATE 75 MG PO TABS: 75.0000 mg | ORAL_TABLET | Freq: Every day | ORAL | Status: DC

## 2018-05-13 MED ORDER — OXYCODONE HCL 5 MG PO TABS: 5.0000 mg | ORAL_TABLET | Freq: Four times a day (QID) | ORAL | 0 refills | Status: DC | PRN

## 2018-05-13 MED ORDER — ASPIRIN EC 81 MG PO TBEC: 81.0000 mg | DELAYED_RELEASE_TABLET | Freq: Every evening | ORAL | Status: DC

## 2018-05-13 MED ORDER — DOCUSATE SODIUM 100 MG PO CAPS: 100.0000 mg | ORAL_CAPSULE | Freq: Two times a day (BID) | ORAL | 1 refills | Status: DC | PRN

## 2018-05-13 MED ORDER — METHOCARBAMOL 500 MG PO TABS: 500.0000 mg | ORAL_TABLET | Freq: Four times a day (QID) | ORAL | 1 refills | Status: DC | PRN

## 2018-05-13 MED ORDER — NAPROXEN SODIUM 220 MG PO TABS: 220.0000 mg | ORAL_TABLET | Freq: Two times a day (BID) | ORAL | Status: DC | PRN

## 2018-05-13 NOTE — Care Management Note (Signed)
Case Management Note  Patient Details  Name: Daniel Carey MRN: [id number] Date of Birth: April 28, 1950  Subjective/Objective:    Pt s/p lumbar surgery. He is from home with spouse.                Action/Plan: No f/u per PT. Pt discharging home with self care. Wife to provide transport home and supervision at home.   Expected Discharge Date:  05/13/18               Expected Discharge Plan:  Home/Self Care  In-House Referral:     Discharge planning Services     Post Acute Care Choice:    Choice offered to:     DME Arranged:    DME Agency:     HH Arranged:    HH Agency:     Status of Service:  Completed, signed off  If discussed at [employer] of Stay Meetings, dates discussed:    Additional Comments:  Pollie Friar, RN 05/13/2018, 12:07 PM

## 2018-05-13 NOTE — Progress Notes (Addendum)
Subjective: 1 Day Post-Op Procedure(s) (LRB): Microlumbar decompression L2-3, possible L1-2 (N/A) Patient reports pain as mild.   Reports incisional pain only, no leg pain, numbness, tingling, weakness. Voiding without difficulty, feels bladder is fully emptying. + BM (small) this AM. Feels ready for D/C home today. Taking only Tylenol for pain Walked last night and this morning and tolerated well Denies any symptoms associated with his bradycardia  Objective: Vital signs in last 24 hours: Temp:  [97 F (36.1 C)-98.2 F (36.8 C)] 97.8 F (36.6 C) (01/24 0726) Pulse Rate:  [32-45] 40 (01/24 0726) Resp:  [11-20] 20 (01/24 0726) BP: (121-172)/(53-72) 134/60 (01/24 0726) SpO2:  [86 %-100 %] 96 % (01/24 0726)  Intake/Output from previous day: 01/23 0701 - 01/24 0700 In: 1450.8 [I.V.:1200.8; IV Piggyback:250] Out: 300 [Urine:250; Blood:50] Intake/Output this shift: No intake/output data recorded.  Recent Labs    05/10/18 1600  HGB 14.2   Recent Labs    05/10/18 1600  WBC 8.4  RBC 4.69  HCT 43.0  PLT 510*   Recent Labs    05/10/18 1600 05/13/18 0629  NA 138 133*  K 4.5 4.9  CL 104 101  CO2 26 23  BUN 19 17  CREATININE 1.40* 1.44*  GLUCOSE 101* 172*  CALCIUM 9.5 9.2   Recent Labs    05/10/18 1516  INR 1.14    Neurologically intact ABD soft Neurovascular intact Sensation intact distally Intact pulses distally Dorsiflexion/Plantar flexion intact Incision: dressing C/D/I and no drainage No cellulitis present Compartment soft no calf pain or sign of DVT  Assessment/Plan: 1 Day Post-Op Procedure(s) (LRB): Microlumbar decompression L2-3, possible L1-2 (N/A) Advance diet Up with therapy D/C IV fluids Appreciate cardiology input, will await their recommendations today Pt stable from ortho standpoint and ready for D/C after seen by cardiology Currently holding metoprolol unless otherwise directed by cardiology Discussed D/C instructions, dressing  instructions, Lspine precautions Will discuss with Dr. Tonita Cong Plan for D/C later today   Daniel Carey 05/13/2018, 7:46 AM

## 2018-05-13 NOTE — Progress Notes (Signed)
    Pt contiues to have bradycardia but is completely asymptomatic He is up walking around today without any dizziness or presyncope  He is OK to go home.  We will see him in the office in several week s    Mertie Moores, MD  05/13/2018 9:50 AM    [address],  [place], [place]  [address] Pager [phone] Phone: [phone]; Fax: [phone]

## 2018-05-13 NOTE — Progress Notes (Signed)
Patient being discharged home. Instructions and education provided to patient. IV removed. CCMD notified. All belongings with patient. Patient leaving unit via wheelchair.

## 2018-05-13 NOTE — Evaluation (Addendum)
Physical Therapy Evaluation Patient Details Name: Daniel Carey MRN: [id number] DOB: [birth year] Today's Date: 05/13/2018   History of Present Illness  68 y.o. male admitted for L2-L3 microlumbar decompression. PMH of CAD, COPD, HTN.   Clinical Impression  Pt is doing quite well with mobility, he independently ambulated 300' without an assistive device, no loss of balance. HR 41 at rest, 51 while walking, pt asymptomatic,  pt is followed by cardiology. Instructed pt/spouse in back precautions and log roll. They demonstrate good understanding of these. From PT standpoint, he is ready to DC home. Encouraged frequent ambulation and limited sitting time at home. PT signing off as pt is independent with mobility.     Follow Up Recommendations No PT follow up    Equipment Recommendations  None recommended by PT    Recommendations for Other Services       Precautions / Restrictions Precautions Precautions: Back Precaution Booklet Issued: Yes (comment) Precaution Comments: instructed pt/spouse in back precautions/log roll Restrictions Weight Bearing Restrictions: No      Mobility  Bed Mobility               General bed mobility comments: up on edge of bed, verbally reviewed log roll, pt stated this is how he's been getting OOB  Transfers Overall transfer level: Independent Equipment used: None                Ambulation/Gait Ambulation/Gait assistance: Independent Gait Distance (Feet): 300 Feet Assistive device: None Gait Pattern/deviations: WFL(Within Functional Limits) Gait velocity: WFL   General Gait Details: steady, no loss of balance, HR 41 at rest and 51 walking, pt states this is baseline and sees a cardiologist  Stairs            Wheelchair Mobility    Modified Rankin (Stroke Patients Only)       Balance Overall balance assessment: Independent                                           Pertinent Vitals/Pain Pain  Assessment: 0-10 Pain Score: 2  Pain Location: incisional soreness  Pain Descriptors / Indicators: Sore Pain Intervention(s): Limited activity within patient's tolerance;Monitored during session(pt declined medication)    Home Living Family/patient expects to be discharged to:: Private residence Living Arrangements: Spouse/significant other Available Help at Discharge: Family;Available 24 hours/day         Home Layout: One level Home Equipment: Walker - 2 wheels;Bedside commode   Commode is standard height, encouraged placement of BSC over commode.    Prior Function Level of Independence: Independent         Comments: denies falls PTA     Hand Dominance        Extremity/Trunk Assessment   Upper Extremity Assessment Upper Extremity Assessment: Overall WFL for tasks assessed    Lower Extremity Assessment Lower Extremity Assessment: Overall WFL for tasks assessed(sensation intact to light touch BLEs, B knee ext and ankle DF 5/5)    Cervical / Trunk Assessment Cervical / Trunk Assessment: Normal  Communication   Communication: No difficulties  Cognition Arousal/Alertness: Awake/alert Behavior During Therapy: WFL for tasks assessed/performed Overall Cognitive Status: Within Functional Limits for tasks assessed  General Comments      Exercises     Assessment/Plan    PT Assessment Patent does not need any further PT services  PT Problem List         PT Treatment Interventions      PT Goals (Current goals can be found in the Care Plan section)  Acute Rehab PT Goals Patient Stated Goal: golf, yard work PT Goal Formulation: All assessment and education complete, DC therapy    Frequency     Barriers to discharge        Co-evaluation               AM-PAC PT "6 Clicks" Mobility  Outcome Measure Help needed turning from your back to your side while in a flat bed without using bedrails?:  None Help needed moving from lying on your back to sitting on the side of a flat bed without using bedrails?: None Help needed moving to and from a bed to a chair (including a wheelchair)?: None Help needed standing up from a chair using your arms (e.g., wheelchair or bedside chair)?: None Help needed to walk in hospital room?: None Help needed climbing 3-5 steps with a railing? : None 6 Click Score: 24    End of Session   Activity Tolerance: Patient tolerated treatment well Patient left: in chair;with call bell/phone within reach;with family/visitor present Nurse Communication: Mobility status      Time: 8938-1017 PT Time Calculation (min) (ACUTE ONLY): 15 min   Charges:   PT Evaluation $PT Eval Low Complexity: 1 Low          Philomena Doheny PT 05/13/2018  Acute Rehabilitation Services Pager [phone] Office [phone]

## 2018-05-13 NOTE — Discharge Summary (Signed)
Physician Discharge Summary   Patient ID: Daniel Carey MRN: [id number] DOB/AGE: 06/28/1950 68 y.o.  Admit date: 05/12/2018 Discharge date: 05/13/2018  Primary Diagnosis:   HNP L2-3  Admission Diagnoses:  Past Medical History:  Diagnosis Date  . 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 CAD (coronary artery disease)    moderate to severe distal CAD with large epicardial arteries having only mild to moderate irregularities. Last cath in Dec 2006; s/p cath with Promus DES in distal RCA and Promus DES in the mid vessel in an overlapping fashion  . Chest pain   . Complication of anesthesia    heart rate dropping with propofol last procedure  . COPD (chronic obstructive pulmonary disease) ([place])   . ED (erectile dysfunction)   . Esophageal stricture   . External hemorrhoids without mention of complication   . GERD (gastroesophageal reflux disease)   . Headache(784.0)   . Hiatal hernia   . History of kidney stones   . HLD (hyperlipidemia)   . HTN (hypertension)   . Myocardial infarction ([place])    1996  . Personal history of colonic polyps 03/12/2010   TUBULAR ADENOMA  . Tobacco abuse    Discharge Diagnoses:   Principal Problem:   Spinal stenosis of lumbar region  Procedure:  Procedure(s) (LRB): Microlumbar decompression L2-3, possible L1-2 (N/A)   Consults: cardiology  HPI:  see H&P    Laboratory Data: Hospital Outpatient Visit on 05/10/2018  Component Date Value Ref Range Status  . Prothrombin Time 05/10/2018 14.5  11.4 - 15.2 seconds Final  . INR 05/10/2018 1.14   Final   Performed at [facility], [place] [address], [place], [place] [address]   Recent Labs    05/10/18 1600  HGB 14.2   Recent Labs    05/10/18 1600  WBC 8.4  RBC 4.69  HCT 43.0  PLT 510*   Recent Labs    05/10/18 1600 05/13/18 0629  NA 138 133*  K 4.5 4.9  CL 104 101  CO2 26 23  BUN 19 17  CREATININE 1.40* 1.44*  GLUCOSE 101* 172*  CALCIUM 9.5 9.2   Recent Labs   05/10/18 1516  INR 1.14    X-Rays:Dg Lumbar Spine 2-3 Views  Result Date: 05/12/2018 CLINICAL DATA:  Lumbar spine surgery, L2-L3 micro lumbar decompression, possible L1-L2 EXAM: LUMBAR SPINE - 2-3 VIEW COMPARISON:  05/10/2018 FINDINGS: Five lumbar vertebra labeled on previous exam. Image #1 at 4503 hours: Metallic probes via dorsal approach project dorsal to the mid L2 and mid L3 levels. Bones appear demineralized with mild disc space narrowing and endplate spur formation throughout lumbar spine. Image #2 at 0806 hours: Tissue spreader and surgical instruments present dorsal to L3 vertebra. Image #3 at 0919 hours: 2 crossed bent metallic probes are identified, projecting dorsal to the inferior L2 and superior L3 levels. IMPRESSION: Intraoperative lumbar localization images as above. Electronically Signed   By: Lavonia Dana M.D.   On: 05/12/2018 10:55   Dg Lumbar Spine 2-3 Views  Result Date: 05/11/2018 CLINICAL DATA:  Preop lumbar spine surgery. EXAM: LUMBAR SPINE - 2-3 VIEW COMPARISON:  CT abdomen/pelvis 04/14/2017 FINDINGS: There are 5 nonrib bearing lumbar-type vertebral bodies. The vertebral body heights are maintained. There is generalized osteopenia. There is a dextroscoliosis of the thoracolumbar spine. There is no static listhesis. There is no spondylolysis. There is no acute fracture. There is degenerative disc disease with disc height loss at L2-3, L3-4,  L4-5 and L5-S1. There is bilateral facet arthropathy throughout the lumbar spine most severe at L4-5 and L5-S1. There is mild osteoarthritis of bilateral sacroiliac joints. There is abdominal aortic atherosclerosis. IMPRESSION: Lumbar spine spondylosis as described above. Electronically Signed   By: Kathreen Devoid   On: 05/11/2018 08:20    EKG: Orders placed or performed during the hospital encounter of 05/12/18  . EKG 12-Lead  . EKG 12-Lead     Hospital Course: Patient was admitted to [facility] and taken to the OR and underwent  the above state procedure without complications.  Patient tolerated the procedure well and was later transferred to the recovery room and then to the orthopaedic floor for postoperative care.  They were given PO and IV analgesics for pain control following their surgery.  They were given 24 hours of postoperative antibiotics.   PT was consulted postop to assist with mobility and transfers.  The patient was allowed to be WBAT with therapy and was taught back precautions. Discharge planning was consulted to help with postop disposition and equipment needs.  Patient had a good night on the evening of surgery and started to get up OOB with therapy on day one. Patient was seen in rounds and was ready to go home on day one.  They were given discharge instructions and dressing directions.  They were instructed on when to follow up in the office with Dr. Tonita Cong.   Diet: Regular diet Activity: WBAT, Lspine precautions Follow-up: in 10-14 days Disposition - Home Discharged Condition: good    Allergies as of 05/13/2018      Reactions   Lisinopril Cough, Rash   Amlodipine Besylate Other (See Comments)   Headache, weakness, dizziness   Propofol Other (See Comments)   Heart rate dropped      Medication List    STOP taking these medications   metoprolol tartrate 25 MG tablet Commonly known as:  LOPRESSOR     TAKE these medications   acetaminophen 500 MG tablet Commonly known as:  TYLENOL Take 500 mg by mouth every 6 (six) hours as needed (pain).   aspirin EC 81 MG tablet Take 1 tablet (81 mg total) by mouth every evening. Resume 4 days post-op What changed:  additional instructions   clopidogrel 75 MG tablet Commonly known as:  PLAVIX Take 1 tablet (75 mg total) by mouth daily. Resume 4 days post-op What changed:  additional instructions   docusate sodium 100 MG capsule Commonly known as:  COLACE Take 1 capsule (100 mg total) by mouth 2 (two) times daily as needed for mild constipation.     famotidine 20 MG tablet Commonly known as:  PEPCID Take 20 mg by mouth daily as needed for heartburn.   Fenofibric Acid 135 MG Cpdr Take 1 tablet by mouth daily. What changed:    how much to take  when to take this   hydrochlorothiazide 25 MG tablet Commonly known as:  HYDRODIURIL TAKE 0.5 TABLETS (12.5 MG TOTAL) BY MOUTH DAILY.   isosorbide mononitrate 30 MG 24 hr tablet Commonly known as:  IMDUR Take 1 tablet (30 mg total) by mouth daily. What changed:  when to take this   methocarbamol 500 MG tablet Commonly known as:  ROBAXIN Take 1 tablet (500 mg total) by mouth every 6 (six) hours as needed for muscle spasms.   naproxen sodium 220 MG tablet Commonly known as:  ALEVE Take 1 tablet (220 mg total) by mouth 2 (two) times daily as needed (for  headache). Resume 5 days post-op as needed What changed:  additional instructions   nitroGLYCERIN 0.4 MG SL tablet Commonly known as:  NITROSTAT Place 1 tablet (0.4 mg total) under the tongue every 5 (five) minutes as needed for chest pain.   oxyCODONE 5 MG immediate release tablet Commonly known as:  Oxy IR/ROXICODONE Take 1-2 tablets (5-10 mg total) by mouth every 6 (six) hours as needed for moderate pain ((score 4 to 6)).   polyethylene glycol packet Commonly known as:  MIRALAX / GLYCOLAX Take 17 g by mouth daily as needed.   PROAIR HFA 108 (90 Base) MCG/ACT inhaler Generic drug:  albuterol NHALE 2 PUFFS INTO THE LUNGS EVERY 6 (SIX) HOURS AS NEEDED FOR WHEEZING OR SHORTNESS OF BREATH.   RABEprazole 20 MG tablet Commonly known as:  ACIPHEX Take 20 mg by mouth daily at 3 pm.   rosuvastatin 20 MG tablet Commonly known as:  CRESTOR TAKE 1 TABLET BY MOUTH EVERY DAY What changed:  when to take this      Follow-up Information    Susa Day, MD Follow up in 2 week(s).   Specialty:  Orthopedic Surgery Contact information: [address] [place] [address] [phone]         Follow up with  Dr. Acie Fredrickson as directed in several weeks  Signed: Lacie Draft, PA-C Orthopaedic Surgery 05/13/2018, 8:46 AM

## 2018-05-13 NOTE — Evaluation (Signed)
Occupational Therapy Evaluation and Discharge Patient Details Name: Daniel Carey MRN: [id number] DOB: 07/02/1950 Today's Date: 05/13/2018    History of Present Illness 68 y.o. male admitted for L2-L3 microlumbar decompression. PMH of CAD, COPD, HTN.    Clinical Impression   Pt and wife educated in ADL and IADL adhering to back precautions. Verbalized and/or demonstrated understanding. No further OT needs.    Follow Up Recommendations  No OT follow up    Equipment Recommendations  None recommended by OT    Recommendations for Other Services       Precautions / Restrictions Precautions Precautions: Back Precaution Booklet Issued: Yes (comment) Precaution Comments: instructed pt and spouse in back precautions related to ADL Restrictions Weight Bearing Restrictions: No      Mobility Bed Mobility               General bed mobility comments: pt in chair  Transfers Overall transfer level: Independent Equipment used: None                  Balance Overall balance assessment: Independent                                         ADL either performed or assessed with clinical judgement   ADL Overall ADL's : Modified independent                                       General ADL Comments: educated pt at length in compensatory strategies for ADL and IADL and activities to avoid     Vision Patient Visual Report: No change from baseline       Perception     Praxis      Pertinent Vitals/Pain Pain Assessment: Faces Pain Score: 2  Faces Pain Scale: Hurts a little bit Pain Location: incisional soreness  Pain Descriptors / Indicators: Sore Pain Intervention(s): Monitored during session     Hand Dominance Right   Extremity/Trunk Assessment Upper Extremity Assessment Upper Extremity Assessment: Overall WFL for tasks assessed   Lower Extremity Assessment Lower Extremity Assessment: Overall WFL for tasks assessed    Cervical / Trunk Assessment Cervical / Trunk Assessment: Normal   Communication Communication Communication: No difficulties   Cognition Arousal/Alertness: Awake/alert Behavior During Therapy: WFL for tasks assessed/performed Overall Cognitive Status: Within Functional Limits for tasks assessed                                     General Comments       Exercises     Shoulder Instructions      Home Living Family/patient expects to be discharged to:: Private residence Living Arrangements: Spouse/significant other Available Help at Discharge: Family;Available 24 hours/day         Home Layout: One level               Home Equipment: Walker - 2 wheels;Bedside commode          Prior Functioning/Environment Level of Independence: Independent        Comments: denies falls PTA        OT Problem List:        OT Treatment/Interventions:      OT Goals(Current goals can be found  in the care plan section) Acute Rehab OT Goals Patient Stated Goal: golf, yard work  OT Frequency:     Barriers to D/C:            Co-evaluation              AM-PAC OT "6 Clicks" Daily Activity     Outcome Measure Help from another person eating meals?: None Help from another person taking care of personal grooming?: None Help from another person toileting, which includes using toliet, bedpan, or urinal?: None Help from another person bathing (including washing, rinsing, drying)?: None Help from another person to put on and taking off regular upper body clothing?: None Help from another person to put on and taking off regular lower body clothing?: None 6 Click Score: 24   End of Session Equipment Utilized During Treatment: Gait belt Nurse Communication: Other (comment)(ready for d/c)  Activity Tolerance: Patient tolerated treatment well Patient left: in chair;with call bell/phone within reach;with family/visitor present  OT Visit Diagnosis: Pain                 Time: 1120-1136 OT Time Calculation (min): 16 min Charges:  OT General Charges $OT Visit: 1 Visit OT Evaluation $OT Eval Low Complexity: 1 Low  Nestor Lewandowsky, OTR/L Acute Rehabilitation Services Pager: [phone] Office: [phone]  Malka So 05/13/2018, 11:44 AM

## 2018-05-18 ENCOUNTER — Other Ambulatory Visit: Payer: Self-pay | Admitting: Cardiovascular Disease

## 2018-05-31 NOTE — Progress Notes (Signed)
Cardiology Office Note:    Date:  06/01/2018   ID:  [name], DOB [birth year], MRN [id number]  PCP:  Dione Housekeeper, MD  Cardiologist:  Mertie Moores, MD   Electrophysiologist:  None   Referring MD: Dione Housekeeper, MD   Chief Complaint  Patient presents with  . Hospitalization Follow-up    bradycardia after back surgery     History of Present Illness:    Daniel Carey is a 68 y.o. male with CAD s/p DES x 2 to the RCA in 2014, sleep apnea, hyperlipidemia.  Cardiac Catheterization in 2018 demonstrated multivessel CAD with diffusely calcified LAD (60-70%), mod to severe LCx and OM2 disease and mod ISR in the mid RCA.  Medical Rx was continued.  He was last seen by Dr. Acie Fredrickson in 12/2017.  He had lumbar spine surgery in 04/2018. He was noted to be bradycardic with 2:1 block.  His beta-blocker was stopped and he was asked to follow up here.  He did have a hematoma evacuated at the time of his surgery.  He is back on ASA but has been off of Plavix since his surgery.     Daniel Carey returns for follow up.  He is here with his wife.  He has not had syncope or near syncope.  He can walk on the treadmill every day.  He records his HR at 90 at times.  He feels like he is less short of breath with activity now. He has not seen any increase in his BP.  He has not had chest pain, paroxysmal nocturnal dyspnea, edema.    Prior CV studies:   The following studies were reviewed today:  Cardiac Catheterization 11/05/16 LM normal  LAD ost 20, prox 65 (FFR 0.73), mid 50, dist 60 (FFR 0.73), 50; D1 ost 50 RI mild dz LCx mid 40, OM1 60, OM2 40, OM3 70 (FFR 0.83) RCA ost 40, prox stent 45 ISR, mid stent 40 ISR; RPDA mild dz; RPAVB mild dz EF 55-65 Conclusions: 1. Multivessel coronary artery disease, including diffuse calcified LAD disease of up to 60-70% (FFR 0.73), moderate to severe LCx and OM3 disease (FFR 0.83 at submaximal hyperemia), and moderate in-stent restenosis of mid RCA. 2. Normal left  ventricular filling pressure. 3. Normal left ventricular systolic function. Recommendations: 1. Escalate antianginal therapy with addition of isosorbide mononitrate. 2. Continue aggressive secondary prevention, including dual antiplatelet and statin therapy. 3. I will review the films with the intervention and cardiac surgery teams. Revascularization options include atherectomy and PCI to LAD (would require multiple stents) versus CABG. 4. Follow-up with Dr. Acie Fredrickson in approximately 2 weeks to reevaluate symptoms after addition of long-acting nitrate, as well as to readdress revascularization options.  Echo 07/26/15 EF 60-65, Gr 1 DD  Myoview 10/24/12 Inf atten (small inf infarct cannot be excluded), EF 73, Low Risk    Past Medical History:  Diagnosis Date  . 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 CAD (coronary artery disease)    moderate to severe distal CAD with large epicardial arteries having only mild to moderate irregularities. Last cath in Dec 2006; s/p cath with Promus DES in distal RCA and Promus DES in the mid vessel in an overlapping fashion  . Chest pain   . Complication of anesthesia    heart rate dropping with propofol last procedure  . COPD (chronic obstructive pulmonary disease) ([place])   . ED (erectile dysfunction)   . Esophageal stricture   .  External hemorrhoids without mention of complication   . GERD (gastroesophageal reflux disease)   . Headache(784.0)   . Hiatal hernia   . History of kidney stones   . HLD (hyperlipidemia)   . HTN (hypertension)   . Myocardial infarction ([place])    1996  . Personal history of colonic polyps 03/12/2010   TUBULAR ADENOMA  . Tobacco abuse    Surgical Hx: The patient  has a past surgical history that includes Angioplasty; Video bronchoscopy (08/13/2011); percutaneous coronary rotoblator intervention (pci-r) (N/A, 06/30/2012); LEFT HEART CATH AND CORONARY ANGIOGRAPHY (N/A, 11/05/2016); Colonoscopy (N/A, 02/16/2017); and Lumbar  laminectomy/decompression microdiscectomy (N/A, 05/12/2018).   Current Medications: Current Meds  Medication Sig  . acetaminophen (TYLENOL) 500 MG tablet Take 500 mg by mouth every 6 (six) hours as needed (pain).  Marland Kitchen albuterol (PROAIR HFA) 108 (90 Base) MCG/ACT inhaler NHALE 2 PUFFS INTO THE LUNGS EVERY 6 (SIX) HOURS AS NEEDED FOR WHEEZING OR SHORTNESS OF BREATH.  Marland Kitchen aspirin EC 81 MG tablet Take 1 tablet (81 mg total) by mouth every evening. Resume 4 days post-op  . Choline Fenofibrate (FENOFIBRIC ACID) 135 MG CPDR Take 1 tablet by mouth daily.  . famotidine (PEPCID) 20 MG tablet Take 20 mg by mouth daily as needed for heartburn.  . hydrochlorothiazide (HYDRODIURIL) 25 MG tablet Take 0.5 tablets (12.5 mg total) by mouth daily.  . naproxen sodium (ALEVE) 220 MG tablet Take 1 tablet (220 mg total) by mouth 2 (two) times daily as needed (for headache). Resume 5 days post-op as needed  . nitroGLYCERIN (NITROSTAT) 0.4 MG SL tablet Place 1 tablet (0.4 mg total) under the tongue every 5 (five) minutes as needed for chest pain.  . polyethylene glycol (MIRALAX / GLYCOLAX) packet Take 17 g by mouth daily as needed.  . RABEprazole (ACIPHEX) 20 MG tablet Take 20 mg by mouth daily at 3 pm.   . rosuvastatin (CRESTOR) 20 MG tablet Take 1 tablet (20 mg total) by mouth daily.  . [DISCONTINUED] Choline Fenofibrate (FENOFIBRIC ACID) 135 MG CPDR TAKE 1 CAPSULE BY MOUTH EVERY DAY  . [DISCONTINUED] hydrochlorothiazide (HYDRODIURIL) 25 MG tablet TAKE 0.5 TABLETS (12.5 MG TOTAL) BY MOUTH DAILY.  . [DISCONTINUED] nitroGLYCERIN (NITROSTAT) 0.4 MG SL tablet Place 1 tablet (0.4 mg total) under the tongue every 5 (five) minutes as needed for chest pain.  . [DISCONTINUED] PROAIR HFA 108 (90 Base) MCG/ACT inhaler NHALE 2 PUFFS INTO THE LUNGS EVERY 6 (SIX) HOURS AS NEEDED FOR WHEEZING OR SHORTNESS OF BREATH.  . [DISCONTINUED] rosuvastatin (CRESTOR) 20 MG tablet TAKE 1 TABLET BY MOUTH EVERY DAY (Patient taking differently: Take 20  mg by mouth every evening. )     Allergies:   Amlodipine besylate; Propofol; Amlodipine; Lisinopril; and Rosuvastatin   Social History   Tobacco Use  . Smoking status: Former Smoker    Packs/day: 0.50    Years: 40.00    Pack years: 20.00    Types: Cigarettes    Last attempt to quit: 08/25/2011    Years since quitting: 6.7  . Smokeless tobacco: Never Used  Substance Use Topics  . Alcohol use: Yes    Comment: rare  . Drug use: No     Family Hx: The patient's family history includes Breast cancer in his sister; Diabetes in his mother; Heart disease in his father; Kidney failure in his mother. There is no history of Colon cancer or Stomach cancer.  ROS:   Please see the history of present illness.    Review of  Systems  Cardiovascular: Positive for irregular heartbeat.   All other systems reviewed and are negative.   EKGs/Labs/Other Test Reviewed:    EKG:  EKG is  ordered today.  The ekg ordered today demonstrates 2:1 AV block, HR 45  Recent Labs: 12/23/2017: ALT 18 01/31/2018: TSH 3.99 05/10/2018: Hemoglobin 14.2; Platelets 510 05/13/2018: BUN 17; Creatinine, Ser 1.44; Potassium 4.9; Sodium 133   Recent Lipid Panel Lab Results  Component Value Date/Time   CHOL 155 12/23/2017 04:16 PM   TRIG 155 (H) 12/23/2017 04:16 PM   HDL 49 12/23/2017 04:16 PM   CHOLHDL 3.2 12/23/2017 04:16 PM   CHOLHDL 3.1 01/09/2016 11:27 AM   LDLCALC 75 12/23/2017 04:16 PM   LDLDIRECT 107.0 02/27/2013 09:04 AM    Physical Exam:    VS:  BP 130/72   Pulse (!) 45   Ht 5\' 9"  (1.753 m)   Wt 189 lb (85.7 kg)   SpO2 98%   BMI 27.91 kg/m     Wt Readings from Last 3 Encounters:  06/01/18 189 lb (85.7 kg)  05/12/18 185 lb (83.9 kg)  05/10/18 185 lb 12.8 oz (84.3 kg)     Physical Exam  Constitutional: He is oriented to person, place, and time. He appears well-developed and well-nourished. No distress.  HENT:  Head: Normocephalic and atraumatic.  Eyes: No scleral icterus.  Neck: No JVD  present. No thyromegaly present.  Cardiovascular: Regular rhythm. Bradycardia present.  No murmur heard. Pulmonary/Chest: He has no wheezes. He has no rales.  Abdominal: Soft.  Musculoskeletal:        General: No edema.  Lymphadenopathy:    He has no cervical adenopathy.  Neurological: He is alert and oriented to person, place, and time.  Skin: Skin is warm and dry.  Psychiatric: He has a normal mood and affect.    ASSESSMENT & PLAN:    Bradycardia He has 2:1 block.  His HR is 45.  He is no longer on beta-blocker therapy.  He is asymptomatic.  I reviewed his case with Dr. Acie Fredrickson and Dr. Rayann Heman today.  Dr. Rayann Heman suggested walking him in the office to see if he increases his HR and changes to 1:1 conduction.  He also suggested arranging follow up with EP to continue to follow this.  With ambulation, his HR increased to 57 and his ECG showed 1:1 conduction (sinus brady with 1st degree AVB).  -Refer to EP for further management/evaluation  Coronary artery disease involving native coronary artery of native heart with angina pectoris ([place]) Hx of DES x 2 to the RCA in 2014.   He has multivessel CAD by Cardiac Catheterization in 2018 that is treated medically. He is doing well without angina despite being off of his beta-blocker.  His Plavix was stopped at the time of surgery.  I discussed this with Dr. Acie Fredrickson who felt that he could remain on just ASA for now.  Continue statin, nitrates.   Essential hypertension The patient's blood pressure is controlled on his current regimen.  Continue current therapy.    Hyperlipidemia, unspecified hyperlipidemia type Continue statin Rx.    Dispo:  Return in about 3 months (around 08/30/2018) for w/ Dr. Acie Fredrickson, Routine Follow Up.   Medication Adjustments/Labs and Tests Ordered: Current medicines are reviewed at length with the patient today.  Concerns regarding medicines are outlined above.  Tests Ordered: Orders Placed This Encounter  Procedures  .  Ambulatory referral to Cardiac Electrophysiology  . EKG 12-Lead   Medication Changes: Meds ordered  this encounter  Medications  . Choline Fenofibrate (FENOFIBRIC ACID) 135 MG CPDR    Sig: Take 1 tablet by mouth daily.    Dispense:  30 capsule    Refill:  11  . hydrochlorothiazide (HYDRODIURIL) 25 MG tablet    Sig: Take 0.5 tablets (12.5 mg total) by mouth daily.    Dispense:  45 tablet    Refill:  3  . albuterol (PROAIR HFA) 108 (90 Base) MCG/ACT inhaler    Sig: NHALE 2 PUFFS INTO THE LUNGS EVERY 6 (SIX) HOURS AS NEEDED FOR WHEEZING OR SHORTNESS OF BREATH.    Dispense:  8.5 Inhaler    Refill:  0  . rosuvastatin (CRESTOR) 20 MG tablet    Sig: Take 1 tablet (20 mg total) by mouth daily.    Dispense:  30 tablet    Refill:  11  . nitroGLYCERIN (NITROSTAT) 0.4 MG SL tablet    Sig: Place 1 tablet (0.4 mg total) under the tongue every 5 (five) minutes as needed for chest pain.    Dispense:  25 tablet    Refill:  4    Signed, Richardson Dopp, PA-C  06/01/2018 4:56 PM    [facility] [address], [place], [place]  [address] Phone: [phone]; Fax: [phone]

## 2018-06-01 ENCOUNTER — Ambulatory Visit (INDEPENDENT_AMBULATORY_CARE_PROVIDER_SITE_OTHER): Payer: BLUE CROSS/BLUE SHIELD | Admitting: Physician Assistant

## 2018-06-01 ENCOUNTER — Encounter: Payer: Self-pay | Admitting: Physician Assistant

## 2018-06-01 VITALS — BP 130/72 | HR 45 | Ht 69.0 in | Wt 189.0 lb

## 2018-06-01 DIAGNOSIS — I25119 Atherosclerotic heart disease of native coronary artery with unspecified angina pectoris: Secondary | ICD-10-CM | POA: Diagnosis not present

## 2018-06-01 DIAGNOSIS — E785 Hyperlipidemia, unspecified: Secondary | ICD-10-CM

## 2018-06-01 DIAGNOSIS — R001 Bradycardia, unspecified: Secondary | ICD-10-CM | POA: Diagnosis not present

## 2018-06-01 DIAGNOSIS — I10 Essential (primary) hypertension: Secondary | ICD-10-CM | POA: Diagnosis not present

## 2018-06-01 MED ORDER — ALBUTEROL SULFATE HFA 108 (90 BASE) MCG/ACT IN AERS: INHALATION_SPRAY | RESPIRATORY_TRACT | 0 refills | Status: DC

## 2018-06-01 MED ORDER — HYDROCHLOROTHIAZIDE 25 MG PO TABS: 12.5000 mg | ORAL_TABLET | Freq: Every day | ORAL | 3 refills | Status: DC

## 2018-06-01 MED ORDER — FENOFIBRIC ACID 135 MG PO CPDR: 1.0000 | DELAYED_RELEASE_CAPSULE | Freq: Every day | ORAL | 11 refills | Status: DC

## 2018-06-01 MED ORDER — ROSUVASTATIN CALCIUM 20 MG PO TABS: 20.0000 mg | ORAL_TABLET | Freq: Every day | ORAL | 11 refills | Status: DC

## 2018-06-01 MED ORDER — NITROGLYCERIN 0.4 MG SL SUBL: 0.4000 mg | SUBLINGUAL_TABLET | SUBLINGUAL | 4 refills | Status: DC | PRN

## 2018-06-01 NOTE — Patient Instructions (Signed)
Medication Instructions:  Your physician recommends that you continue on your current medications as directed. Please refer to the Current Medication list given to you today.   If you need a refill on your cardiac medications before your next appointment, please call your pharmacy.   Lab work: NONE  If you have labs (blood work) drawn today and your tests are completely normal, you will receive your results only by: Marland Kitchen MyChart Message (if you have MyChart) OR . A paper copy in the mail If you have any lab test that is abnormal or we need to change your treatment, we will call you to review the results.  Testing/Procedures: NONE  Follow-Up: At [facility], you and your health needs are our priority.  As part of our continuing mission to provide you with exceptional heart care, we have created designated Provider Care Teams.  These Care Teams include your primary Cardiologist (physician) and Advanced Practice Providers (APPs -  Physician Assistants and Nurse Practitioners) who all work together to provide you with the care you need, when you need it. . You will need a follow up appointment in:  3 months.  You may see Mertie Moores, MD or one of the following Advanced Practice Providers on your designated Care Team: . Richardson Dopp, PA-C . Vin Bhagat, PA-C . Daune Perch, NP  You have been referred to Electrophysiology to see Dr. Lovena Le.  Any Other Special Instructions Will Be Listed Below (If Applicable).

## 2018-06-03 ENCOUNTER — Ambulatory Visit (INDEPENDENT_AMBULATORY_CARE_PROVIDER_SITE_OTHER): Payer: BLUE CROSS/BLUE SHIELD | Admitting: Internal Medicine

## 2018-06-03 ENCOUNTER — Encounter: Payer: Self-pay | Admitting: Internal Medicine

## 2018-06-03 VITALS — BP 124/70 | HR 49 | Ht 69.0 in | Wt 191.0 lb

## 2018-06-03 DIAGNOSIS — E785 Hyperlipidemia, unspecified: Secondary | ICD-10-CM

## 2018-06-03 DIAGNOSIS — R001 Bradycardia, unspecified: Secondary | ICD-10-CM

## 2018-06-03 DIAGNOSIS — I2510 Atherosclerotic heart disease of native coronary artery without angina pectoris: Secondary | ICD-10-CM

## 2018-06-03 DIAGNOSIS — I10 Essential (primary) hypertension: Secondary | ICD-10-CM

## 2018-06-03 NOTE — Progress Notes (Signed)
HPI Mr. Weider is referred today by Dr. 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 He is a pleasant 68 yo man with a h/o bradycardia. He had been on a beta blocker in the past but this has been stopped. Despite his bradycardia, he has not been particularly symptomatic. He has remained active.His HR will only get up to the 70 range. He has had know 2:1 AV block. No frank syncope.  Allergies  Allergen Reactions  . Amlodipine Besylate Other (See Comments)    Headache, weakness, dizziness  . Propofol Other (See Comments)    Heart rate dropped Heart rate dropped Heart rate dropped   . Amlodipine Other (See Comments) and Rash    Headache, weakness, dizziness Headache, weakness, dizziness   . Lisinopril Cough and Rash  . Rosuvastatin Other (See Comments) and Rash     muscle aches  muscle aches     Current Outpatient Medications  Medication Sig Dispense Refill  . acetaminophen (TYLENOL) 500 MG tablet Take 500 mg by mouth every 6 (six) hours as needed (pain).    Marland Kitchen albuterol (PROAIR HFA) 108 (90 Base) MCG/ACT inhaler NHALE 2 PUFFS INTO THE LUNGS EVERY 6 (SIX) HOURS AS NEEDED FOR WHEEZING OR SHORTNESS OF BREATH. 8.5 Inhaler 0  . aspirin EC 81 MG tablet Take 1 tablet (81 mg total) by mouth every evening. Resume 4 days post-op    . Choline Fenofibrate (FENOFIBRIC ACID) 135 MG CPDR Take 1 tablet by mouth daily. 30 capsule 11  . famotidine (PEPCID) 20 MG tablet Take 20 mg by mouth daily as needed for heartburn.    . hydrochlorothiazide (HYDRODIURIL) 25 MG tablet Take 0.5 tablets (12.5 mg total) by mouth daily. 45 tablet 3  . naproxen sodium (ALEVE) 220 MG tablet Take 1 tablet (220 mg total) by mouth 2 (two) times daily as needed (for headache). Resume 5 days post-op as needed    . nitroGLYCERIN (NITROSTAT) 0.4 MG SL tablet Place 1 tablet (0.4 mg total) under the tongue every 5 (five) minutes as needed for chest pain. 25 tablet 4  . polyethylene glycol (MIRALAX / GLYCOLAX) packet  Take 17 g by mouth daily as needed. 14 each 0  . RABEprazole (ACIPHEX) 20 MG tablet Take 20 mg by mouth daily at 3 pm.     . rosuvastatin (CRESTOR) 20 MG tablet Take 1 tablet (20 mg total) by mouth daily. 30 tablet 11   No current facility-administered medications for this visit.      Past Medical History:  Diagnosis Date  . 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 CAD (coronary artery disease)    moderate to severe distal CAD with large epicardial arteries having only mild to moderate irregularities. Last cath in Dec 2006; s/p cath with Promus DES in distal RCA and Promus DES in the mid vessel in an overlapping fashion  . Chest pain   . Complication of anesthesia    heart rate dropping with propofol last procedure  . COPD (chronic obstructive pulmonary disease) ([place])   . ED (erectile dysfunction)   . Esophageal stricture   . External hemorrhoids without mention of complication   . GERD (gastroesophageal reflux disease)   . Headache(784.0)   . Hiatal hernia   . History of kidney stones   . HLD (hyperlipidemia)   . HTN (hypertension)   . Myocardial infarction ([place])    1996  . Personal history of colonic polyps 03/12/2010  TUBULAR ADENOMA  . Tobacco abuse     ROS:   All systems reviewed and negative except as noted in the HPI.   Past Surgical History:  Procedure Laterality Date  . ANGIOPLASTY     X3  . COLONOSCOPY N/A 02/16/2017   Procedure: COLONOSCOPY;  Surgeon: Jerene Bears, MD;  Location: [facility];  Service: Gastroenterology;  Laterality: N/A;  . LEFT HEART CATH AND CORONARY ANGIOGRAPHY N/A 11/05/2016   Procedure: Left Heart Cath and Coronary Angiography;  Surgeon: Nelva Bush, MD;  Location: [facility];  Service: Cardiovascular;  Laterality: N/A;  . LUMBAR LAMINECTOMY/DECOMPRESSION MICRODISCECTOMY N/A 05/12/2018   Procedure: Microlumbar decompression L2-3, possible L1-2;  Surgeon: Susa Day, MD;  Location: [facility];  Service: Orthopedics;   Laterality: N/A;  2 hrs, Microlumbar decompression L2-3, possible L1-2  . PERCUTANEOUS CORONARY ROTOBLATOR INTERVENTION (PCI-R) N/A 06/30/2012   Procedure: PERCUTANEOUS CORONARY ROTOBLATOR INTERVENTION (PCI-R);  Surgeon: Burnell Blanks, MD;  Location: [facility] C[facility] LAB;  Service: Cardiovascular;  Laterality: N/A;  . VIDEO BRONCHOSCOPY  08/13/2011   Procedure: VIDEO BRONCHOSCOPY WITHOUT FLUORO;  Surgeon: Tanda Rockers, MD;  Location: [facility];  Service: Cardiopulmonary;  Laterality: Bilateral;     Family History  Problem Relation Age of Onset  . Heart disease Father        hx of MI  . Diabetes Mother        and brother   . Kidney failure Mother   . Breast cancer Sister   . Colon cancer Neg Hx   . Stomach cancer Neg Hx      Social History   Socioeconomic History  . Marital status: Married    Spouse name: Not on file  . Number of children: 1  . Years of education: Not on file  . Highest education level: Not on file  Occupational History  . Occupation: Radio broadcast assistant  Social Needs  . Financial resource strain: Not on file  . Food insecurity:    Worry: Not on file    Inability: Not on file  . Transportation needs:    Medical: Not on file    Non-medical: Not on file  Tobacco Use  . Smoking status: Former Smoker    Packs/day: 0.50    Years: 40.00    Pack years: 20.00    Types: Cigarettes    Last attempt to quit: 08/25/2011    Years since quitting: 6.7  . Smokeless tobacco: Never Used  Substance and Sexual Activity  . Alcohol use: Yes    Comment: rare  . Drug use: No  . Sexual activity: Not Currently  Lifestyle  . Physical activity:    Days per week: Not on file    Minutes per session: Not on file  . Stress: Not on file  Relationships  . Social connections:    Talks on phone: Not on file    Gets together: Not on file    Attends religious service: Not on file    Active member of club or organization: Not on file    Attends meetings of clubs or  organizations: Not on file    Relationship status: Not on file  . Intimate partner violence:    Fear of current or ex partner: Not on file    Emotionally abused: Not on file    Physically abused: Not on file    Forced sexual activity: Not on file  Other Topics Concern  . Not on file  Social History Narrative  .  Not on file     BP 124/70   Pulse (!) 49   Ht 5\' 9"  (1.753 m)   Wt 191 lb (86.6 kg)   SpO2 96%   BMI 28.21 kg/m   Physical Exam:  Well appearing NAD HEENT: Unremarkable Neck:  No JVD, no thyromegally Lymphatics:  No adenopathy Back:  No CVA tenderness Lungs:  Clear with no wheezes  HEART:  Regular brady rhythm, no murmurs, no rubs, no clicks Abd:  soft, positive bowel sounds, no organomegally, no rebound, no guarding Ext:  2 plus pulses, no edema, no cyanosis, no clubbing Skin:  No rashes no nodules Neuro:  CN II through XII intact, motor grossly intact  EKG - reviewed NSR with 2:1 AV block  Assess/Plan: 1. 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 The risks/benefits/goals/expectations of PPM insertion were discussed. I very clearly recommended that he undergo this procedure. He is reflecting. He is on no AV nodal blocking drugs. 2. CAD - he is s/p stenting remotely. He denies anginal symptoms.  3. Dyslipidemia - he will continue his statin therapy.   Mikle Bosworth.D.

## 2018-06-03 NOTE — H&P (View-Only) (Signed)
HPI Daniel Carey is referred today by Dr. 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 He is a pleasant 68 yo man with a h/o bradycardia. He had been on a beta blocker in the past but this has been stopped. Despite his bradycardia, he has not been particularly symptomatic. He has remained active.His HR will only get up to the 70 range. He has had know 2:1 AV block. No frank syncope.  Allergies  Allergen Reactions  . Amlodipine Besylate Other (See Comments)    Headache, weakness, dizziness  . Propofol Other (See Comments)    Heart rate dropped Heart rate dropped Heart rate dropped   . Amlodipine Other (See Comments) and Rash    Headache, weakness, dizziness Headache, weakness, dizziness   . Lisinopril Cough and Rash  . Rosuvastatin Other (See Comments) and Rash     muscle aches  muscle aches     Current Outpatient Medications  Medication Sig Dispense Refill  . acetaminophen (TYLENOL) 500 MG tablet Take 500 mg by mouth every 6 (six) hours as needed (pain).    Marland Kitchen albuterol (PROAIR HFA) 108 (90 Base) MCG/ACT inhaler NHALE 2 PUFFS INTO THE LUNGS EVERY 6 (SIX) HOURS AS NEEDED FOR WHEEZING OR SHORTNESS OF BREATH. 8.5 Inhaler 0  . aspirin EC 81 MG tablet Take 1 tablet (81 mg total) by mouth every evening. Resume 4 days post-op    . Choline Fenofibrate (FENOFIBRIC ACID) 135 MG CPDR Take 1 tablet by mouth daily. 30 capsule 11  . famotidine (PEPCID) 20 MG tablet Take 20 mg by mouth daily as needed for heartburn.    . hydrochlorothiazide (HYDRODIURIL) 25 MG tablet Take 0.5 tablets (12.5 mg total) by mouth daily. 45 tablet 3  . naproxen sodium (ALEVE) 220 MG tablet Take 1 tablet (220 mg total) by mouth 2 (two) times daily as needed (for headache). Resume 5 days post-op as needed    . nitroGLYCERIN (NITROSTAT) 0.4 MG SL tablet Place 1 tablet (0.4 mg total) under the tongue every 5 (five) minutes as needed for chest pain. 25 tablet 4  . polyethylene glycol (MIRALAX / GLYCOLAX) packet  Take 17 g by mouth daily as needed. 14 each 0  . RABEprazole (ACIPHEX) 20 MG tablet Take 20 mg by mouth daily at 3 pm.     . rosuvastatin (CRESTOR) 20 MG tablet Take 1 tablet (20 mg total) by mouth daily. 30 tablet 11   No current facility-administered medications for this visit.      Past Medical History:  Diagnosis Date  . 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 CAD (coronary artery disease)    moderate to severe distal CAD with large epicardial arteries having only mild to moderate irregularities. Last cath in Dec 2006; s/p cath with Promus DES in distal RCA and Promus DES in the mid vessel in an overlapping fashion  . Chest pain   . Complication of anesthesia    heart rate dropping with propofol last procedure  . COPD (chronic obstructive pulmonary disease) ([place])   . ED (erectile dysfunction)   . Esophageal stricture   . External hemorrhoids without mention of complication   . GERD (gastroesophageal reflux disease)   . Headache(784.0)   . Hiatal hernia   . History of kidney stones   . HLD (hyperlipidemia)   . HTN (hypertension)   . Myocardial infarction ([place])    1996  . Personal history of colonic polyps 03/12/2010  TUBULAR ADENOMA  . Tobacco abuse     ROS:   All systems reviewed and negative except as noted in the HPI.   Past Surgical History:  Procedure Laterality Date  . ANGIOPLASTY     X3  . COLONOSCOPY N/A 02/16/2017   Procedure: COLONOSCOPY;  Surgeon: Jerene Bears, MD;  Location: [facility];  Service: Gastroenterology;  Laterality: N/A;  . LEFT HEART CATH AND CORONARY ANGIOGRAPHY N/A 11/05/2016   Procedure: Left Heart Cath and Coronary Angiography;  Surgeon: Nelva Bush, MD;  Location: [facility];  Service: Cardiovascular;  Laterality: N/A;  . LUMBAR LAMINECTOMY/DECOMPRESSION MICRODISCECTOMY N/A 05/12/2018   Procedure: Microlumbar decompression L2-3, possible L1-2;  Surgeon: Susa Day, MD;  Location: [facility];  Service: Orthopedics;   Laterality: N/A;  2 hrs, Microlumbar decompression L2-3, possible L1-2  . PERCUTANEOUS CORONARY ROTOBLATOR INTERVENTION (PCI-R) N/A 06/30/2012   Procedure: PERCUTANEOUS CORONARY ROTOBLATOR INTERVENTION (PCI-R);  Surgeon: Burnell Blanks, MD;  Location: [facility];  Service: Cardiovascular;  Laterality: N/A;  . VIDEO BRONCHOSCOPY  08/13/2011   Procedure: VIDEO BRONCHOSCOPY WITHOUT FLUORO;  Surgeon: Tanda Rockers, MD;  Location: [facility];  Service: Cardiopulmonary;  Laterality: Bilateral;     Family History  Problem Relation Age of Onset  . Heart disease Father        hx of MI  . Diabetes Mother        and brother   . Kidney failure Mother   . Breast cancer Sister   . Colon cancer Neg Hx   . Stomach cancer Neg Hx      Social History   Socioeconomic History  . Marital status: Married    Spouse name: Not on file  . Number of children: 1  . Years of education: Not on file  . Highest education level: Not on file  Occupational History  . Occupation: Radio broadcast assistant  Social Needs  . Financial resource strain: Not on file  . Food insecurity:    Worry: Not on file    Inability: Not on file  . Transportation needs:    Medical: Not on file    Non-medical: Not on file  Tobacco Use  . Smoking status: Former Smoker    Packs/day: 0.50    Years: 40.00    Pack years: 20.00    Types: Cigarettes    Last attempt to quit: 08/25/2011    Years since quitting: 6.7  . Smokeless tobacco: Never Used  Substance and Sexual Activity  . Alcohol use: Yes    Comment: rare  . Drug use: No  . Sexual activity: Not Currently  Lifestyle  . Physical activity:    Days per week: Not on file    Minutes per session: Not on file  . Stress: Not on file  Relationships  . Social connections:    Talks on phone: Not on file    Gets together: Not on file    Attends religious service: Not on file    Active member of club or organization: Not on file    Attends meetings of clubs or  organizations: Not on file    Relationship status: Not on file  . Intimate partner violence:    Fear of current or ex partner: Not on file    Emotionally abused: Not on file    Physically abused: Not on file    Forced sexual activity: Not on file  Other Topics Concern  . Not on file  Social History Narrative  .  Not on file     BP 124/70   Pulse (!) 49   Ht 5\' 9"  (1.753 m)   Wt 191 lb (86.6 kg)   SpO2 96%   BMI 28.21 kg/m   Physical Exam:  Well appearing NAD HEENT: Unremarkable Neck:  No JVD, no thyromegally Lymphatics:  No adenopathy Back:  No CVA tenderness Lungs:  Clear with no wheezes  HEART:  Regular brady rhythm, no murmurs, no rubs, no clicks Abd:  soft, positive bowel sounds, no organomegally, no rebound, no guarding Ext:  2 plus pulses, no edema, no cyanosis, no clubbing Skin:  No rashes no nodules Neuro:  CN II through XII intact, motor grossly intact  EKG - reviewed NSR with 2:1 AV block  Assess/Plan: 1. 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 The risks/benefits/goals/expectations of PPM insertion were discussed. I very clearly recommended that he undergo this procedure. He is reflecting. He is on no AV nodal blocking drugs. 2. CAD - he is s/p stenting remotely. He denies anginal symptoms.  3. Dyslipidemia - he will continue his statin therapy.   Mikle Bosworth.D.

## 2018-06-03 NOTE — Patient Instructions (Addendum)
Medication Instructions:  Your physician recommends that you continue on your current medications as directed. Please refer to the Current Medication list given to you today.  Labwork: None ordered.  Testing/Procedures: Your physician has recommended that you have a pacemaker inserted. A pacemaker is a small device that is placed under the skin of your chest or abdomen to help control abnormal heart rhythms. This device uses electrical pulses to prompt the heart to beat at a normal rate. Pacemakers are used to treat heart rhythms that are too slow. Wire (leads) are attached to the pacemaker that goes into the chambers of you heart. This is done in the hospital and usually requires and overnight stay. Please see the instruction sheet given to you today for more information.  The following days are available for procedures:  February 17, 20, 24, 26 March 2, 6, 16, 20, 23, 25, 27, 30 April 3, 6, 9, 15, 17, 20, 23, 27, 29  If you decide on a day please give me a call:  Bing Neighbors [phone]    Pacemaker Implantation, Adult Pacemaker implantation is a procedure to place a pacemaker inside your chest. A pacemaker is a small computer that sends electrical signals to the heart and helps your heart beat normally. A pacemaker also stores information about your heart rhythms. You may need pacemaker implantation if you:  Have a slow heartbeat (bradycardia).  Faint (syncope).  Have shortness of breath (dyspnea) due to heart problems. The pacemaker attaches to your heart through a wire, called a lead. Sometimes just one lead is needed. Other times, there will be two leads. There are two types of pacemakers:  Transvenous pacemaker. This type is placed under the skin or muscle of your chest. The lead goes through a vein in the chest area to reach the inside of the heart.  Epicardial pacemaker. This type is placed under the skin or muscle of your chest or belly. The lead goes through your chest to the  outside of the heart. Tell a health care provider about:  Any allergies you have.  All medicines you are taking, including vitamins, herbs, eye drops, creams, and over-the-counter medicines.  Any problems you or family members have had with anesthetic medicines.  Any blood or bone disorders you have.  Any surgeries you have had.  Any medical conditions you have.  Whether you are pregnant or may be pregnant. What are the risks? Generally, this is a safe procedure. However, problems may occur, including:  Infection.  Bleeding.  Failure of the pacemaker or the lead.  Collapse of a lung or bleeding into a lung.  Blood clot inside a blood vessel with a lead.  Damage to the heart.  Infection inside the heart (endocarditis).  Allergic reactions to medicines. What happens before the procedure? Staying hydrated Follow instructions from your health care provider about hydration, which may include:  Up to 2 hours before the procedure - you may continue to drink clear liquids, such as water, clear fruit juice, black coffee, and plain tea. Eating and drinking restrictions Follow instructions from your health care provider about eating and drinking, which may include:  8 hours before the procedure - stop eating heavy meals or foods such as meat, fried foods, or fatty foods.  6 hours before the procedure - stop eating light meals or foods, such as toast or cereal.  6 hours before the procedure - stop drinking milk or drinks that contain milk.  2 hours before the procedure - stop  drinking clear liquids. Medicines  Ask your health care provider about: ? Changing or stopping your regular medicines. This is especially important if you are taking diabetes medicines or blood thinners. ? Taking medicines such as aspirin and ibuprofen. These medicines can thin your blood. Do not take these medicines before your procedure if your health care provider instructs you not to.  You may be  given antibiotic medicine to help prevent infection. General instructions  You will have a heart evaluation. This may include an electrocardiogram (ECG), chest X-ray, and heart imaging (echocardiogram,  or echo) tests.  You will have blood tests.  Do not use any products that contain nicotine or tobacco, such as cigarettes and e-cigarettes. If you need help quitting, ask your health care provider.  Plan to have someone take you home from the hospital or clinic.  If you will be going home right after the procedure, plan to have someone with you for 24 hours.  Ask your health care provider how your surgical site will be marked or identified. What happens during the procedure?  To reduce your risk of infection: ? Your health care team will wash or sanitize their hands. ? Your skin will be washed with soap. ? Hair may be removed from the surgical area.  An IV tube will be inserted into one of your veins.  You will be given one or more of the following: ? A medicine to help you relax (sedative). ? A medicine to numb the area (local anesthetic). ? A medicine to make you fall asleep (general anesthetic).  If you are getting a transvenous pacemaker: ? An incision will be made in your upper chest. ? A pocket will be made for the pacemaker. It may be placed under the skin or between layers of muscle. ? The lead will be inserted into a blood vessel that returns to the heart. ? While X-rays are taken by an imaging machine (fluoroscopy), the lead will be advanced through the vein to the inside of your heart. ? The other end of the lead will be tunneled under the skin and attached to the pacemaker.  If you are getting an epicardial pacemaker: ? An incision will be made near your ribs or breastbone (sternum) for the lead. ? The lead will be attached to the outside of your heart. ? Another incision will be made in your chest or upper belly to create a pocket for the pacemaker. ? The free end  of the lead will be tunneled under the skin and attached to the pacemaker.  The transvenous or epicardial pacemaker will be tested. Imaging studies may be done to check the lead position.  The incisions will be closed with stitches (sutures), adhesive strips, or skin glue.  Bandages (dressing) will be placed over the incisions. The procedure may vary among health care providers and hospitals. What happens after the procedure?  Your blood pressure, heart rate, breathing rate, and blood oxygen level will be monitored until the medicines you were given have worn off.  You will be given antibiotics and pain medicine.  ECG and chest x-rays will be done.  You will wear a continuous type of ECG (Holter monitor) to check your heart rhythm.  Your health care provider will program the pacemaker.  Do not drive for 24 hours if you received a sedative. This information is not intended to replace advice given to you by your health care provider. Make sure you discuss any questions you have with your health  care provider. Document Released: 03/27/2002 Document Revised: 12/24/2017 Document Reviewed: 09/18/2015 Elsevier Interactive Patient Education  2019 [employer].

## 2018-06-13 ENCOUNTER — Telehealth: Payer: Self-pay | Admitting: Internal Medicine

## 2018-06-13 DIAGNOSIS — R001 Bradycardia, unspecified: Secondary | ICD-10-CM

## 2018-06-13 NOTE — Telephone Encounter (Signed)
New Message           Patient's wife is calling to speak with Sonia Baller concerning a Psychologist, forensic, that is to be scheduled. She states she has questions. Pls call to advise.

## 2018-06-14 NOTE — Telephone Encounter (Signed)
Pt scheduled for pacemaker.  Leaving instruction letter and soap at front desk for pt to pick up.

## 2018-06-15 ENCOUNTER — Other Ambulatory Visit: Payer: BLUE CROSS/BLUE SHIELD

## 2018-06-15 DIAGNOSIS — R001 Bradycardia, unspecified: Secondary | ICD-10-CM

## 2018-06-15 LAB — CBC WITH DIFFERENTIAL/PLATELET
Basophils Absolute: 0 10*3/uL (ref 0.0–0.2)
Basos: 0 %
EOS (ABSOLUTE): 0.1 10*3/uL (ref 0.0–0.4)
Eos: 2 %
Hematocrit: 41.5 % (ref 37.5–51.0)
Hemoglobin: 14.4 g/dL (ref 13.0–17.7)
Lymphocytes Absolute: 3.1 10*3/uL (ref 0.7–3.1)
Lymphs: 40 %
MCH: 30.6 pg (ref 26.6–33.0)
MCHC: 34.7 g/dL (ref 31.5–35.7)
MCV: 88 fL (ref 79–97)
Monocytes Absolute: 0.8 10*3/uL (ref 0.1–0.9)
Monocytes: 10 %
Neutrophils Absolute: 3.8 10*3/uL (ref 1.4–7.0)
Neutrophils: 48 %
Platelets: 250 10*3/uL (ref 150–450)
RBC: 4.7 x10E6/uL (ref 4.14–5.80)
RDW: 14.7 % (ref 11.6–15.4)
WBC: 7.9 10*3/uL (ref 3.4–10.8)

## 2018-06-15 LAB — BASIC METABOLIC PANEL
BUN/Creatinine Ratio: 14 (ref 10–24)
BUN: 16 mg/dL (ref 8–27)
CO2: 27 mmol/L (ref 20–29)
Calcium: 9.3 mg/dL (ref 8.6–10.2)
Chloride: 102 mmol/L (ref 96–106)
Creatinine, Ser: 1.11 mg/dL (ref 0.76–1.27)
GFR calc Af Amer: 80 mL/min/{1.73_m2} (ref >59)
GFR calc non Af Amer: 69 mL/min/{1.73_m2} (ref >59)
Glucose: 99 mg/dL (ref 65–99)
Potassium: 4.5 mmol/L (ref 3.5–5.2)
Sodium: 136 mmol/L (ref 134–144)

## 2018-06-20 ENCOUNTER — Ambulatory Visit (HOSPITAL_COMMUNITY)
Admission: RE | Admit: 2018-06-20 | Discharge: 2018-06-21 | Disposition: A | Discharge status: Home / Self Care | Payer: BLUE CROSS/BLUE SHIELD | Attending: Internal Medicine | Admitting: Internal Medicine

## 2018-06-20 ENCOUNTER — Encounter (HOSPITAL_COMMUNITY)
Admission: RE | Disposition: A | Discharge status: Home / Self Care | Payer: Self-pay | Source: Home / Self Care | Attending: Internal Medicine

## 2018-06-20 ENCOUNTER — Other Ambulatory Visit: Payer: Self-pay

## 2018-06-20 ENCOUNTER — Encounter (HOSPITAL_COMMUNITY): Payer: Self-pay | Admitting: General Practice

## 2018-06-20 DIAGNOSIS — I252 Old myocardial infarction: Secondary | ICD-10-CM | POA: Diagnosis not present

## 2018-06-20 DIAGNOSIS — I10 Essential (primary) hypertension: Secondary | ICD-10-CM | POA: Diagnosis not present

## 2018-06-20 DIAGNOSIS — E785 Hyperlipidemia, unspecified: Secondary | ICD-10-CM | POA: Diagnosis not present

## 2018-06-20 DIAGNOSIS — Z87891 Personal history of nicotine dependence: Secondary | ICD-10-CM | POA: Diagnosis not present

## 2018-06-20 DIAGNOSIS — J449 Chronic obstructive pulmonary disease, unspecified: Secondary | ICD-10-CM | POA: Diagnosis not present

## 2018-06-20 DIAGNOSIS — K219 Gastro-esophageal reflux disease without esophagitis: Secondary | ICD-10-CM | POA: Insufficient documentation

## 2018-06-20 DIAGNOSIS — G4733 Obstructive sleep apnea (adult) (pediatric): Secondary | ICD-10-CM | POA: Insufficient documentation

## 2018-06-20 DIAGNOSIS — Z888 Allergy status to other drugs, medicaments and biological substances status: Secondary | ICD-10-CM | POA: Insufficient documentation

## 2018-06-20 DIAGNOSIS — I2510 Atherosclerotic heart disease of native coronary artery without angina pectoris: Secondary | ICD-10-CM | POA: Diagnosis not present

## 2018-06-20 DIAGNOSIS — Z79899 Other long term (current) drug therapy: Secondary | ICD-10-CM | POA: Insufficient documentation

## 2018-06-20 DIAGNOSIS — I441 Atrioventricular block, second degree: Secondary | ICD-10-CM

## 2018-06-20 DIAGNOSIS — Z8249 Family history of ischemic heart disease and other diseases of the circulatory system: Secondary | ICD-10-CM | POA: Diagnosis not present

## 2018-06-20 DIAGNOSIS — Z7982 Long term (current) use of aspirin: Secondary | ICD-10-CM | POA: Insufficient documentation

## 2018-06-20 HISTORY — PX: INSERT / REPLACE / REMOVE PACEMAKER: SUR710

## 2018-06-20 HISTORY — PX: PACEMAKER IMPLANT: EP1218

## 2018-06-20 LAB — SURGICAL PCR SCREEN
MRSA, PCR: NEGATIVE
Staphylococcus aureus: NEGATIVE

## 2018-06-20 SURGERY — PACEMAKER IMPLANT

## 2018-06-20 MED ORDER — FAMOTIDINE 20 MG PO TABS: 20.0000 mg | ORAL_TABLET | Freq: Every day | ORAL | Status: DC | PRN

## 2018-06-20 MED ORDER — HYDROCHLOROTHIAZIDE 25 MG PO TABS: 12.5000 mg | ORAL_TABLET | Freq: Every day | ORAL | Status: DC

## 2018-06-20 MED ORDER — CEFAZOLIN SODIUM-DEXTROSE 2-4 GM/100ML-% IV SOLN
2.0000 g | INTRAVENOUS | Status: AC
  Administered 2018-06-20: 2 g via INTRAVENOUS

## 2018-06-20 MED ORDER — YOU HAVE A PACEMAKER BOOK
Freq: Once | Status: AC
  Administered 2018-06-21: 02:00:00

## 2018-06-20 MED ORDER — ISOSORBIDE MONONITRATE ER 30 MG PO TB24
30.0000 mg | ORAL_TABLET | Freq: Every day | ORAL | Status: DC
  Administered 2018-06-20: 20:00:00 30 mg via ORAL
  Filled 2018-06-20: qty 1

## 2018-06-20 MED ORDER — LIDOCAINE HCL (PF) 1 % IJ SOLN
INTRAMUSCULAR | Status: AC
  Filled 2018-06-20: qty 60

## 2018-06-20 MED ORDER — FENTANYL CITRATE (PF) 100 MCG/2ML IJ SOLN
INTRAMUSCULAR | Status: AC
  Filled 2018-06-20: qty 2

## 2018-06-20 MED ORDER — LIDOCAINE HCL (PF) 1 % IJ SOLN
INTRAMUSCULAR | Status: DC | PRN
  Administered 2018-06-20: 50 mL

## 2018-06-20 MED ORDER — HEPARIN (PORCINE) IN NACL 1000-0.9 UT/500ML-% IV SOLN
INTRAVENOUS | Status: DC | PRN
  Administered 2018-06-20: 500 mL

## 2018-06-20 MED ORDER — ONDANSETRON HCL 4 MG/2ML IJ SOLN: 4.0000 mg | Freq: Four times a day (QID) | INTRAMUSCULAR | Status: DC | PRN

## 2018-06-20 MED ORDER — CEFAZOLIN SODIUM-DEXTROSE 1-4 GM/50ML-% IV SOLN
1.0000 g | Freq: Four times a day (QID) | INTRAVENOUS | Status: AC
  Administered 2018-06-20 – 2018-06-21 (×3): 1 g via INTRAVENOUS
  Filled 2018-06-20 (×3): qty 50

## 2018-06-20 MED ORDER — CHLORHEXIDINE GLUCONATE 4 % EX LIQD
60.0000 mL | Freq: Once | CUTANEOUS | Status: DC
  Filled 2018-06-20: qty 60

## 2018-06-20 MED ORDER — ACETAMINOPHEN 325 MG PO TABS
325.0000 mg | ORAL_TABLET | ORAL | Status: DC | PRN
  Administered 2018-06-20 (×2): 650 mg via ORAL
  Filled 2018-06-20 (×2): qty 2

## 2018-06-20 MED ORDER — HEPARIN (PORCINE) IN NACL 1000-0.9 UT/500ML-% IV SOLN
INTRAVENOUS | Status: AC
  Filled 2018-06-20: qty 500

## 2018-06-20 MED ORDER — SODIUM CHLORIDE 0.9 % IV SOLN
INTRAVENOUS | Status: AC
  Filled 2018-06-20: qty 2

## 2018-06-20 MED ORDER — SODIUM CHLORIDE 0.9 % IV SOLN
INTRAVENOUS | Status: DC
  Administered 2018-06-20: 13:00:00 via INTRAVENOUS

## 2018-06-20 MED ORDER — MUPIROCIN 2 % EX OINT
1.0000 "application " | TOPICAL_OINTMENT | Freq: Once | CUTANEOUS | Status: AC
  Administered 2018-06-20: 1 via TOPICAL
  Filled 2018-06-20: qty 22

## 2018-06-20 MED ORDER — FENTANYL CITRATE (PF) 100 MCG/2ML IJ SOLN
INTRAMUSCULAR | Status: DC | PRN
  Administered 2018-06-20: 25 ug via INTRAVENOUS

## 2018-06-20 MED ORDER — MIDAZOLAM HCL 5 MG/5ML IJ SOLN
INTRAMUSCULAR | Status: DC | PRN
  Administered 2018-06-20: 2 mg via INTRAVENOUS

## 2018-06-20 MED ORDER — MUPIROCIN 2 % EX OINT
TOPICAL_OINTMENT | CUTANEOUS | Status: AC
  Filled 2018-06-20: qty 22

## 2018-06-20 MED ORDER — MIDAZOLAM HCL 5 MG/5ML IJ SOLN
INTRAMUSCULAR | Status: AC
  Filled 2018-06-20: qty 5

## 2018-06-20 MED ORDER — SODIUM CHLORIDE 0.9 % IV SOLN
80.0000 mg | INTRAVENOUS | Status: AC
  Administered 2018-06-20: 80 mg

## 2018-06-20 MED ORDER — CEFAZOLIN SODIUM-DEXTROSE 2-4 GM/100ML-% IV SOLN
INTRAVENOUS | Status: AC
  Filled 2018-06-20: qty 100

## 2018-06-20 MED ORDER — NITROGLYCERIN 0.4 MG SL SUBL: 0.4000 mg | SUBLINGUAL_TABLET | SUBLINGUAL | Status: DC | PRN

## 2018-06-20 SURGICAL SUPPLY — 11 items
CABLE SURGICAL S-101-97-12 (CABLE) ×2 IMPLANT
CATH RIGHTSITE C315HIS02 (CATHETERS) ×1 IMPLANT
LEAD SELECT SECURE 3830 383069 (Lead) IMPLANT
LEAD TENDRIL MRI 52CM LPA1200M (Lead) ×1 IMPLANT
PACEMAKER ASSURITY DR-RF (Pacemaker) ×1 IMPLANT
PAD PRO RADIOLUCENT 2001M-C (PAD) ×2 IMPLANT
SELECT SECURE 3830 383069 (Lead) ×2 IMPLANT
SHEATH CLASSIC 8F (SHEATH) ×2 IMPLANT
SLITTER 6232ADJ (MISCELLANEOUS) ×1 IMPLANT
TRAY PACEMAKER INSERTION (PACKS) ×2 IMPLANT
WIRE HI TORQ VERSACORE-J 145CM (WIRE) ×1 IMPLANT

## 2018-06-20 NOTE — Discharge Instructions (Signed)
° ° °  Supplemental Discharge Instructions for  °Pacemaker/Defibrillator Patients ° °Activity °No heavy lifting or vigorous activity with your left/right arm for 6 to 8 weeks.  Do not raise your left/right arm above your head for one week.  Gradually raise your affected arm as drawn below. ° °        °    06/24/2018                  06/25/2018                   06/26/2018                 06/27/2018 °__ ° °NO DRIVING for  1 week   ; you may begin driving on   06/27/2018  . ° °WOUND CARE °- Keep the wound area clean and dry.  Do not get this area wet for one week. No showers for one week; you may shower on  06/27/2018   . °- The tape/steri-strips on your wound will fall off; do not pull them off.  No bandage is needed on the site.  DO  NOT apply any creams, oils, or ointments to the wound area. °- If you notice any drainage or discharge from the wound, any swelling or bruising at the site, or you develop a fever > 101? F after you are discharged home, call the office at once. ° °Special Instructions °- You are still able to use cellular telephones; use the ear opposite the side where you have your pacemaker/defibrillator.  Avoid carrying your cellular phone near your device. °- When traveling through airports, show security personnel your identification card to avoid being screened in the metal detectors.  Ask the security personnel to use the hand wand. °- Avoid arc welding equipment, MRI testing (magnetic resonance imaging), TENS units (transcutaneous nerve stimulators).  Call the office for questions about other devices. °- Avoid electrical appliances that are in poor condition or are not properly grounded. °- Microwave ovens are safe to be near or to operate. ° ° °

## 2018-06-20 NOTE — Discharge Summary (Addendum)
ELECTROPHYSIOLOGY PROCEDURE DISCHARGE SUMMARY    Patient ID: Daniel Carey,  MRN: [id number], DOB/AGE: 07-06-1950 68 y.o.  Admit date: 06/20/2018 Discharge date: 3/3/220  Primary Care Physician: Dione Housekeeper, MD  Primary Cardiologist: Dr. Acie Fredrickson Electrophysiologist: Dr. Lovena Le  Primary Discharge Diagnosis:  1. Advanced heart block  Secondary Discharge Diagnosis:  1. CAD (last PCI 2014) 2. OSA 3. HTN  Allergies  Allergen Reactions  . Amlodipine Besylate Other (See Comments)    Headache, weakness, dizziness  . Propofol Other (See Comments)    Heart rate dropped    . Lisinopril Cough and Rash  . Rosuvastatin Rash and Other (See Comments)     muscle aches   . Metoprolol     Lowered heart rate too much      Procedures This Admission:  1.  Implantation of a SJM dual chamber PPM on 06/20/2018 by Dr Lovena Le.  The patient received a St. Jude (serial number [id number] right atrial lead and a medtronic (serial number [id number]) right ventricular lead, St. Jude (serial number [id number]) pacemaker  There were no immediate post procedure complications. 2.  CXR on 06/21/2018 demonstrated no pneumothorax status post device implantation.   Brief HPI: Daniel Carey is a 68 y.o. male was referred to electrophysiology in the outpatient setting for consideration of PPM implantation.  Past medical history includes above.  The patient has had symptomatic bradycardia, 2:1 AV block without reversible causes identified.  Risks, benefits, and alternatives to PPM implantation were reviewed with the patient who wished to proceed.   Hospital Course:  The patient was admitted and underwent implantation of a PPM with details as outlined above.  He  was monitored on telemetry overnight which demonstrated SR/ VP.  Left chest was without hematoma or ecchymosis.  The device was interrogated and found to be functioning normally.  CXR was obtained and demonstrated no pneumothorax status post device  implantation, reviewed with Dr. Lovena Le, patient is without symptoms of illnees, no SOB, cough, or abnormal pulmonary findings on exam.  Wound care, arm mobility, and restrictions were reviewed with the patient.  The patient feels well, no CP or SOB.  He was examined by Dr. Lovena Le and considered stable for discharge to home.    Physical Exam: Vitals:   06/20/18 1446 06/20/18 1918 06/21/18 0723 06/21/18 0735  BP: (!) 166/102 (!) 148/80 139/87   Pulse: 79 77 72   Resp: (!) 25 12 12    Temp:  98.3 F (36.8 C) 97.6 F (36.4 C)   TempSrc:  Oral Oral   SpO2: 100% 97% 94% 95%  Weight:   87 kg   Height:        GEN- The patient is well appearing, alert and oriented x 3 today.   HEENT: normocephalic, atraumatic; sclera clear, conjunctiva pink; hearing intact; oropharynx clear; neck supple, no JVP Lungs- CTA b/l, normal work of breathing.  No wheezes, rales, rhonchi Heart- RRR, no murmurs, rubs or gallops, PMI not laterally displaced GI- soft, non-tender, non-distended Extremities- no clubbing, cyanosis, or edema MS- no significant deformity or atrophy Skin- warm and dry, no rash or lesion, left chest without hematoma/ecchymosis Psych- euthymic mood, full affect Neuro- no gross deficits   Labs:   Lab Results  Component Value Date   WBC 7.9 06/15/2018   HGB 14.4 06/15/2018   HCT 41.5 06/15/2018   MCV 88 06/15/2018   PLT 250 06/15/2018    Recent Labs  Lab 06/15/18 1500  NA 136  K 4.5  CL 102  CO2 27  BUN 16  CREATININE 1.11  CALCIUM 9.3  GLUCOSE 99    Discharge Medications:  Allergies as of 06/21/2018      Reactions   Amlodipine Besylate Other (See Comments)   Headache, weakness, dizziness   Propofol Other (See Comments)   Heart rate dropped   Lisinopril Cough, Rash   Rosuvastatin Rash, Other (See Comments)    muscle aches   Metoprolol    Lowered heart rate too much       Medication List    TAKE these medications   acetaminophen 500 MG tablet Commonly known as:   TYLENOL Take 1,000 mg by mouth every 6 (six) hours as needed (pain).   albuterol 108 (90 Base) MCG/ACT inhaler Commonly known as:  PROAIR HFA NHALE 2 PUFFS INTO THE LUNGS EVERY 6 (SIX) HOURS AS NEEDED FOR WHEEZING OR SHORTNESS OF BREATH. What changed:    how much to take  how to take this  when to take this  reasons to take this  additional instructions   aspirin EC 81 MG tablet Take 1 tablet (81 mg total) by mouth every evening. Resume 4 days post-op What changed:    when to take this  additional instructions   famotidine 20 MG tablet Commonly known as:  PEPCID Take 20 mg by mouth daily as needed for heartburn.   Fenofibric Acid 135 MG Cpdr Take 1 tablet by mouth daily. What changed:    how much to take  when to take this   hydrochlorothiazide 25 MG tablet Commonly known as:  HYDRODIURIL Take 0.5 tablets (12.5 mg total) by mouth daily.   isosorbide mononitrate 30 MG 24 hr tablet Commonly known as:  IMDUR Take 30 mg by mouth daily.   naproxen sodium 220 MG tablet Commonly known as:  ALEVE Take 1 tablet (220 mg total) by mouth 2 (two) times daily as needed (for headache). Resume 5 days post-op as needed What changed:  additional instructions   nitroGLYCERIN 0.4 MG SL tablet Commonly known as:  NITROSTAT Place 1 tablet (0.4 mg total) under the tongue every 5 (five) minutes as needed for chest pain.   polyethylene glycol packet Commonly known as:  MIRALAX / GLYCOLAX Take 17 g by mouth daily as needed.   RABEprazole 20 MG tablet Commonly known as:  ACIPHEX Take 20 mg by mouth daily at 3 pm.   rosuvastatin 20 MG tablet Commonly known as:  CRESTOR Take 1 tablet (20 mg total) by mouth daily.       Disposition:  Home  Discharge Instructions    Diet - low sodium heart healthy   Complete by:  As directed    Increase activity slowly   Complete by:  As directed      Follow-up Information    [place] [facility] Follow up.   Specialty:   Cardiology Why:  06/30/2018 @ 8:30AM, wound check visit Contact information: [address], [address] [place]       Evans Lance, MD Follow up.   Specialty:  Cardiology Why:  09/22/2018 @ 4:30PM Contact information: [address] [address] [phone]           Duration of Discharge Encounter: Greater than 30 minutes including physician time.  Venetia Night, PA-C 06/21/2018 9:37 AM  EP Attending  Patient seen and examined. Agree with the findings as noted above. 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 PPM interrogation under my direction demonstrates normal device function. CXR looks good. He will be discharged home with the usual followup.   Mikle Bosworth.D.

## 2018-06-20 NOTE — Interval H&P Note (Signed)
History and Physical Interval Note:  06/20/2018 1:11 PM  Daniel Carey  has presented today for surgery, with the diagnosis of brabicardia  The various methods of treatment have been discussed with the patient and family. After consideration of risks, benefits and other options for treatment, the patient has consented to  Procedure(s): PACEMAKER IMPLANT (N/A) as a surgical intervention .  The patient's history has been reviewed, patient examined, no change in status, stable for surgery.  I have reviewed the patient's chart and labs.  Questions were answered to the patient's satisfaction.     Daniel Carey

## 2018-06-21 ENCOUNTER — Encounter (HOSPITAL_COMMUNITY): Payer: Self-pay | Admitting: Internal Medicine

## 2018-06-21 ENCOUNTER — Ambulatory Visit (HOSPITAL_COMMUNITY): Payer: BLUE CROSS/BLUE SHIELD

## 2018-06-21 DIAGNOSIS — Z87891 Personal history of nicotine dependence: Secondary | ICD-10-CM | POA: Diagnosis not present

## 2018-06-21 DIAGNOSIS — Z888 Allergy status to other drugs, medicaments and biological substances status: Secondary | ICD-10-CM | POA: Diagnosis not present

## 2018-06-21 DIAGNOSIS — Z950 Presence of cardiac pacemaker: Secondary | ICD-10-CM | POA: Diagnosis not present

## 2018-06-21 DIAGNOSIS — Z79899 Other long term (current) drug therapy: Secondary | ICD-10-CM | POA: Diagnosis not present

## 2018-06-21 DIAGNOSIS — J449 Chronic obstructive pulmonary disease, unspecified: Secondary | ICD-10-CM | POA: Diagnosis not present

## 2018-06-21 DIAGNOSIS — I441 Atrioventricular block, second degree: Secondary | ICD-10-CM | POA: Diagnosis not present

## 2018-06-21 DIAGNOSIS — Z7982 Long term (current) use of aspirin: Secondary | ICD-10-CM | POA: Diagnosis not present

## 2018-06-21 DIAGNOSIS — I10 Essential (primary) hypertension: Secondary | ICD-10-CM | POA: Diagnosis not present

## 2018-06-21 DIAGNOSIS — I2510 Atherosclerotic heart disease of native coronary artery without angina pectoris: Secondary | ICD-10-CM | POA: Diagnosis not present

## 2018-06-21 DIAGNOSIS — G4733 Obstructive sleep apnea (adult) (pediatric): Secondary | ICD-10-CM | POA: Diagnosis not present

## 2018-06-21 DIAGNOSIS — E785 Hyperlipidemia, unspecified: Secondary | ICD-10-CM | POA: Diagnosis not present

## 2018-06-21 DIAGNOSIS — Z8249 Family history of ischemic heart disease and other diseases of the circulatory system: Secondary | ICD-10-CM | POA: Diagnosis not present

## 2018-06-21 DIAGNOSIS — K219 Gastro-esophageal reflux disease without esophagitis: Secondary | ICD-10-CM | POA: Diagnosis not present

## 2018-06-21 DIAGNOSIS — I252 Old myocardial infarction: Secondary | ICD-10-CM | POA: Diagnosis not present

## 2018-06-23 DIAGNOSIS — Z4889 Encounter for other specified surgical aftercare: Secondary | ICD-10-CM | POA: Diagnosis not present

## 2018-06-30 ENCOUNTER — Other Ambulatory Visit: Payer: Self-pay

## 2018-06-30 ENCOUNTER — Ambulatory Visit (INDEPENDENT_AMBULATORY_CARE_PROVIDER_SITE_OTHER): Payer: BLUE CROSS/BLUE SHIELD | Admitting: Nurse Practitioner

## 2018-06-30 DIAGNOSIS — I441 Atrioventricular block, second degree: Secondary | ICD-10-CM | POA: Diagnosis not present

## 2018-06-30 LAB — CUP PACEART INCLINIC DEVICE CHECK
Battery Remaining Longevity: 57 mo
Battery Voltage: 3.05 V
Brady Statistic RA Percent Paced: 0.14 %
Brady Statistic RV Percent Paced: 99.73 %
Date Time Interrogation Session: 20200312104513
Implantable Lead Implant Date: 20200302
Implantable Lead Implant Date: 20200302
Implantable Lead Location: 753859
Implantable Lead Location: 753860
Implantable Lead Model: 3830
Implantable Pulse Generator Implant Date: 20200302
Lead Channel Impedance Value: 662.5 Ohm
Lead Channel Impedance Value: 675 Ohm
Lead Channel Pacing Threshold Amplitude: 0.5 V
Lead Channel Pacing Threshold Amplitude: 0.5 V
Lead Channel Pacing Threshold Amplitude: 0.75 V
Lead Channel Pacing Threshold Amplitude: 0.75 V
Lead Channel Pacing Threshold Pulse Width: 0.5 ms
Lead Channel Pacing Threshold Pulse Width: 0.5 ms
Lead Channel Pacing Threshold Pulse Width: 1 ms
Lead Channel Pacing Threshold Pulse Width: 1 ms
Lead Channel Sensing Intrinsic Amplitude: 12 mV
Lead Channel Sensing Intrinsic Amplitude: 5 mV
Lead Channel Setting Pacing Amplitude: 3.5 V
Lead Channel Setting Pacing Amplitude: 3.5 V
Lead Channel Setting Pacing Pulse Width: 1 ms
Lead Channel Setting Sensing Sensitivity: 2 mV
Pulse Gen Model: 2272
Pulse Gen Serial Number: 9114136

## 2018-06-30 NOTE — Progress Notes (Signed)
Wound check appointment. Steri-strips removed. Wound without redness or edema. Incision edges approximated, wound well healed. Normal device function. Thresholds, sensing, and impedances consistent with implant measurements. Device programmed at 3.5V/auto capture programmed on for extra safety margin until 3 month visit. Histogram distribution appropriate for patient and level of activity. No mode switches or high ventricular rates noted. Patient educated about wound care, arm mobility, lifting restrictions. ROV in 3 months with implanting physician. Pt with intermittent chest discomfort with activity like prior to stenting. Dr Lovena Le reviewed with patient today, will resume Metoprolol.

## 2018-07-04 ENCOUNTER — Other Ambulatory Visit: Payer: Self-pay

## 2018-07-12 DIAGNOSIS — G4733 Obstructive sleep apnea (adult) (pediatric): Secondary | ICD-10-CM | POA: Diagnosis not present

## 2018-07-19 NOTE — Telephone Encounter (Signed)
Error

## 2018-07-23 ENCOUNTER — Other Ambulatory Visit: Payer: Self-pay | Admitting: Physician Assistant

## 2018-07-25 NOTE — Telephone Encounter (Signed)
It really should be refilled by his PCP. I probably refilled it for him the last time I saw him b/c he was out. If he is unable to get his PCP to refill it right now, ok to refill it under my name with 2 refills (but, put in notes to pharmacy to contact PCP for future refills). Richardson Dopp, PA-C    07/25/2018 12:49 PM

## 2018-08-02 ENCOUNTER — Ambulatory Visit: Payer: BLUE CROSS/BLUE SHIELD | Admitting: Internal Medicine

## 2018-08-23 ENCOUNTER — Other Ambulatory Visit: Payer: Self-pay | Admitting: Physician Assistant

## 2018-09-08 ENCOUNTER — Telehealth: Payer: Self-pay | Admitting: Nurse Practitioner

## 2018-09-08 NOTE — Telephone Encounter (Signed)
YOUR CARDIOLOGY TEAM HAS ARRANGED FOR AN E-VISIT FOR YOUR APPOINTMENT - PLEASE REVIEW IMPORTANT INFORMATION BELOW SEVERAL DAYS PRIOR TO YOUR APPOINTMENT  Due to the recent COVID-19 pandemic, we are transitioning in-person office visits to tele-medicine visits in an effort to decrease unnecessary exposure to our patients, their families, and staff. These visits are billed to your insurance just like a normal visit is. We also encourage you to sign up for MyChart if you have not already done so. You will need a smartphone if possible. For patients that do not have this, we can still complete the visit using a regular telephone but do prefer a smartphone to enable video when possible. You may have a family member that lives with you that can help. If possible, we also ask that you have a blood pressure cuff and scale at home to measure your blood pressure, heart rate and weight prior to your scheduled appointment. Patients with clinical needs that need an in-person evaluation and testing will still be able to come to the office if absolutely necessary. If you have any questions, feel free to call our office.   YOUR PROVIDER WILL BE USING THE FOLLOWING PLATFORM TO COMPLETE YOUR VISIT: Doxy.Me  . IF USING DOXIMITY or [email or web address].ME - The staff will give you instructions on receiving your link to join the meeting the day of your visit.     2-3 DAYS BEFORE YOUR APPOINTMENT  You will receive a telephone call from one of our [place] team members - your caller ID may say "Unknown caller." If this is a video visit, we will walk you through how to get the video launched on your phone. We will remind you check your blood pressure, heart rate and weight prior to your scheduled appointment. If you have an Apple Watch or Kardia, please upload any pertinent ECG strips the day before or morning of your appointment to [facility]. Our staff will also make sure you have reviewed the consent and agree to move forward with your  scheduled tele-health visit.    THE DAY OF YOUR APPOINTMENT  Approximately 15 minutes prior to your scheduled appointment, you will receive a telephone call from one of [place] team - your caller ID may say "Unknown caller."  Our staff will confirm medications, vital signs for the day and any symptoms you may be experiencing. Please have this information available prior to the time of visit start. It may also be helpful for you to have a pad of paper and pen handy for any instructions given during your visit. They will also walk you through joining the smartphone meeting if this is a video visit.    CONSENT FOR TELE-HEALTH VISIT - PLEASE REVIEW  I hereby voluntarily request, consent and authorize [facility] and its employed or contracted physicians, physician assistants, nurse practitioners or other licensed health care professionals (the Practitioner), to provide me with telemedicine health care services (the "Services") as deemed necessary by the treating Practitioner. I acknowledge and consent to receive the Services by the Practitioner via telemedicine. I understand that the telemedicine visit will involve communicating with the Practitioner through live audiovisual communication technology and the disclosure of certain medical information by electronic transmission. I acknowledge that I have been given the opportunity to request an in-person assessment or other available alternative prior to the telemedicine visit and am voluntarily participating in the telemedicine visit.  I understand that I have the right to withhold or withdraw my consent to the use of telemedicine in  the course of my care at any time, without affecting my right to future care or treatment, and that the Practitioner or I may terminate the telemedicine visit at any time. I understand that I have the right to inspect all information obtained and/or recorded in the course of the telemedicine visit and may receive copies of  available information for a reasonable fee.  I understand that some of the potential risks of receiving the Services via telemedicine include:  Marland Kitchen Delay or interruption in medical evaluation due to technological equipment failure or disruption; . Information transmitted may not be sufficient (e.g. poor resolution of images) to allow for appropriate medical decision making by the Practitioner; and/or  . In rare instances, security protocols could fail, causing a breach of personal health information.  Furthermore, I acknowledge that it is my responsibility to provide information about my medical history, conditions and care that is complete and accurate to the best of my ability. I acknowledge that Practitioner's advice, recommendations, and/or decision may be based on factors not within their control, such as incomplete or inaccurate data provided by me or distortions of diagnostic images or specimens that may result from electronic transmissions. I understand that the practice of medicine is not an exact science and that Practitioner makes no warranties or guarantees regarding treatment outcomes. I acknowledge that I will receive a copy of this consent concurrently upon execution via email to the email address I last provided but may also request a printed copy by calling the office of [facility].    I understand that my insurance will be billed for this visit.   I have read or had this consent read to me. . I understand the contents of this consent, which adequately explains the benefits and risks of the Services being provided via telemedicine.  . I have been provided ample opportunity to ask questions regarding this consent and the Services and have had my questions answered to my satisfaction. . I give my informed consent for the services to be provided through the use of telemedicine in my medical care  By participating in this telemedicine visit I agree to the above.

## 2018-09-14 ENCOUNTER — Telehealth (INDEPENDENT_AMBULATORY_CARE_PROVIDER_SITE_OTHER): Payer: BLUE CROSS/BLUE SHIELD | Admitting: Cardiovascular Disease

## 2018-09-14 ENCOUNTER — Encounter: Payer: Self-pay | Admitting: Cardiovascular Disease

## 2018-09-14 ENCOUNTER — Other Ambulatory Visit: Payer: Self-pay

## 2018-09-14 VITALS — BP 124/73 | HR 70 | Ht 69.0 in | Wt 190.0 lb

## 2018-09-14 DIAGNOSIS — I441 Atrioventricular block, second degree: Secondary | ICD-10-CM | POA: Diagnosis not present

## 2018-09-14 DIAGNOSIS — I25118 Atherosclerotic heart disease of native coronary artery with other forms of angina pectoris: Secondary | ICD-10-CM | POA: Diagnosis not present

## 2018-09-14 DIAGNOSIS — Z7189 Other specified counseling: Secondary | ICD-10-CM

## 2018-09-14 DIAGNOSIS — E782 Mixed hyperlipidemia: Secondary | ICD-10-CM | POA: Diagnosis not present

## 2018-09-14 MED ORDER — METOPROLOL TARTRATE 25 MG PO TABS: 25.0000 mg | ORAL_TABLET | Freq: Two times a day (BID) | ORAL | 3 refills | Status: DC

## 2018-09-14 NOTE — Patient Instructions (Signed)
Medication Instructions:  Your physician has recommended you make the following change in your medication:  1.) increase metoprolol tartrate 25 mg to -one tablet by twice daiy  If you need a refill on your cardiac medications before your next appointment, please call your pharmacy.   Lab work: Lipids/liver function and basic metabolic panel in about 3 months - prior to next appt.  If you have labs (blood work) drawn today and your tests are completely normal, you will receive your results only by: Marland Kitchen MyChart Message (if you have MyChart) OR . A paper copy in the mail If you have any lab test that is abnormal or we need to change your treatment, we will call you to review the results.  Testing/Procedures: none  . Follow-Up:  Please call the office to schedule your follow up for in about 3 months with blood work prior.  Any Other Special Instructions Will Be Listed Below (If Applicable). Attempted to reach patient by phone to schedule appointments.  Left VM for him to call back and schedule.

## 2018-09-14 NOTE — Progress Notes (Signed)
Virtual Visit via Video Note   This visit type was conducted due to national recommendations for restrictions regarding the COVID-19 Pandemic (e.g. social distancing) in an effort to limit this patient's exposure and mitigate transmission in our community.  Due to his co-morbid illnesses, this patient is at least at moderate risk for complications without adequate follow up.  This format is felt to be most appropriate for this patient at this time.  All issues noted in this document were discussed and addressed.  A limited physical exam was performed with this format.  Please refer to the patient's chart for his consent to telehealth for [facility].   Date:  09/14/2018   ID:  Daniel Carey, DOB Jul 13, 1950, MRN [id number]  Patient Location: Home Provider Location: Home  PCP:  Dione Housekeeper, MD  Cardiologist:  Mertie Moores, MD  Electrophysiologist:  None   Evaluation Performed:  Follow-Up Visit  Problem List: 1. CAD - 2.5 x 38 mm Promus Premier DES in the distal RCA and a 2.75 x 38 mm Promus Premier DES in the mid vessel in an overlapping fashion. The distal stent was post-dilated with a 2.5 x 20 mm [place] balloon. The proximal portion of the stent was post-dilated with a 3.0 x 20 mm [place] balloon ( June 30, 2012)  2. Bradycardia after getting propafol for endoscopy. 3. Obstructive sleep apnea:     History of Present Illness:  Daniel Carey is feeling well today.  He's been getting some exercise. He does yard work and walks on the treadmill occasionally. He had an episode of bradycardia after getting IV protocol for an endoscopy procedure. He was seen by [name]. His metoprolol dose was decreased. He has also had a bronchoscopy with [name]. He does not have any evidence of COPD.  Both he and his wife have stop smoking.  ( Aug 20, 2011)  Feb. 28, 2014:  He continues to have increasing chest pain.  He has not been smoking.  The pains are associated with exertion, last 5-10 minutes,  resolve with 1 SL NTG.   He's had a mildly elevated TSH for the past year or so and has had some fatigue.    his TSH level 6.8.      His medical doctor started him on Synthroid. He gradually has titrated up to 0.1 mg a day. Since that time he's had increasing episodes of chest pain.  Aug 29, 2012:  Daniel Carey saw him in February we performed a cardiac catheterization because of increasing chest pain. He was found to have sequential stenosis in his mid right coronary artery and had PCI of his right coronary artery ( 2.5 x 38 mm Promus Premier DES in the distal RCA and a 2.75 x 38 mm Promus Premier DES in the mid vessel in an overlapping fashion. The distal sten was post-dilated with a 2.5 x 20 mm [place] balloon. The proximal portion of the stent was post-dilated with a 3.0 x 20 mm [place] balloon)  He and his wife are concerned about whether or not the Plavix is active.  His generic plavix manufacturer has changed.   He would like to be tested.   October 14, 2012:  Daniel Carey has been having some occasional CP.  He has had some episodes of dyspnea while climbing up a hill playing golf.  This past Monday night, he had some chest pain - possible due to indigestions.  these  He had lost his NTG - he has refilled it now.  Nov. 10, 2014:  Daniel Carey is doing well.  He is working too much according to wife. he's had a few CP but not severe enough to take NTG.  Still has some dyspnea with climbing steps.   He had stenting in march 2014.  October 23, 2013:  Daniel Carey is doing well.   Playing golf.  Working in the yard.  Not exercising as much.   He has lost about 6 lbs.  He had labs drawn today.     Jan. 7, 2016:  He has been waking up in the middle of the night and feels like he cant catch his  Breath. Still working out - on the treadmill twice this week and has done well.   Sept. 1, 2016:  Has had some short of breath. Does not get any exercise.  His CP is much better since starting CPAP .  Is having  bloating  - thinks its due to the lipitor   July 19, 2015: Has had some shortness of breath.   With exertion . Happens occasionally but seem to be getting more frequently .  BP is elevated today  Has cut back on his salt .   Sept. 21, 2017:  Daniel Carey is seen today for follow up visit for his CAD Occasionally short of breath . Comes and goes .  BP is a little elevated today Has occasional CP  Able to do all that he wants to do   November 04, 2016: Still having some chest pain Not exertional ,  Seems very similar to his previous episodes of angina prior to stenting .   July 31 , 2108  Daniel Carey is seen today for follow up of a recent cath.  He was found to have diffuse moderate - severe CAD - especially in the LAD  Was started on Imdur - is having a headache with that. Angina seems to be slightly  better.  He still has marked DOE - wife is very concerned about this .   is going to see pulmonary soon .  Sept. 24, 2018:  Daniel Carey is doing very well. His chest pain seemed to be better. Getting some exercise   A little bit more salt than usual over the weekend. His diastolic blood pressure today is 90.  Nov. 8, 2018:    Daniel Carey is seen today  Has worsening dyspnea.   Chest tightness Also had a bladder infection  - burning with urination. ,   Fevers, chills.   Temp of 99.8 Still having fevers and chills  His dyspnea seems to be worse with his chills.  No blood in urine,   extremy dysuria and frequency + cough , no hemoptysis, No pleuretic CP  No real angina   April 08, 2017:  Daniel Carey is seen back for follow up visit Has urosepsis when I last saw him ( UTI with bacteremia )  Treated with ABx Now is having dysuria .   Is seeing urologist tomorrow .   No serious angina .   December 23, 2017:  Daniel Carey is seen today for follow-up of his coronary artery disease. Had a colonoscopy  Complicated by a E.coli UTI when I last saw him  Rare episodes of CP   Chief Complaint:   CAD   Sep 14, 2018    Daniel Carey is a 68 y.o. male with hx of CAD. 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  Seems to  be doing well Pacer is working well.  Has mild chest discomfort when he first gets on the treadmill.    We discussed plavix.  He had developed a paraspinal hematoma while on Plavix.  He required to decompress. The hematoma was spontaneous.  Will DC  Plavix   Wearing a mask.  Social distancing  The patient does not have symptoms concerning for COVID-19 infection (fever, chills, cough, or new shortness of breath).    Past Medical History:  Diagnosis Date  . 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 CAD (coronary artery disease)    moderate to severe distal CAD with large epicardial arteries having only mild to moderate irregularities. Last cath in Dec 2006; s/p cath with Promus DES in distal RCA and Promus DES in the mid vessel in an overlapping fashion  . Chest pain   . Complication of anesthesia    heart rate dropping with propofol last procedure  . COPD (chronic obstructive pulmonary disease) ([place])   . ED (erectile dysfunction)   . Esophageal stricture   . External hemorrhoids without mention of complication   . GERD (gastroesophageal reflux disease)   . Headache(784.0)   . Hiatal hernia   . History of kidney stones   . HLD (hyperlipidemia)   . HTN (hypertension)   . Myocardial infarction ([place])    1996  . Personal history of colonic polyps 03/12/2010   TUBULAR ADENOMA  . Tobacco abuse    Past Surgical History:  Procedure Laterality Date  . ANGIOPLASTY     X3  . COLONOSCOPY N/A 02/16/2017   Procedure: COLONOSCOPY;  Surgeon: Jerene Bears, MD;  Location: [facility];  Service: Gastroenterology;  Laterality: N/A;  . INSERT / REPLACE / REMOVE PACEMAKER  06/20/2018  . LEFT HEART CATH AND CORONARY ANGIOGRAPHY N/A 11/05/2016   Procedure: Left Heart Cath and Coronary Angiography;   Surgeon: Nelva Bush, MD;  Location: [facility];  Service: Cardiovascular;  Laterality: N/A;  . LUMBAR LAMINECTOMY/DECOMPRESSION MICRODISCECTOMY N/A 05/12/2018   Procedure: Microlumbar decompression L2-3, possible L1-2;  Surgeon: Susa Day, MD;  Location: [facility];  Service: Orthopedics;  Laterality: N/A;  2 hrs, Microlumbar decompression L2-3, possible L1-2  . PACEMAKER IMPLANT N/A 06/20/2018   Procedure: PACEMAKER IMPLANT;  Surgeon: Evans Lance, MD;  Location: [facility];  Service: Cardiovascular;  Laterality: N/A;  . PERCUTANEOUS CORONARY ROTOBLATOR INTERVENTION (PCI-R) N/A 06/30/2012   Procedure: PERCUTANEOUS CORONARY ROTOBLATOR INTERVENTION (PCI-R);  Surgeon: Burnell Blanks, MD;  Location: [facility] CATH LAB;  Service: Cardiovascular;  Laterality: N/A;  . VIDEO BRONCHOSCOPY  08/13/2011   Procedure: VIDEO BRONCHOSCOPY WITHOUT FLUORO;  Surgeon: Tanda Rockers, MD;  Location: [facility];  Service: Cardiopulmonary;  Laterality: Bilateral;     Current Meds  Medication Sig  . acetaminophen (TYLENOL) 500 MG tablet Take 1,000 mg by mouth every 6 (six) hours as needed (pain).   Marland Kitchen albuterol (VENTOLIN HFA) 108 (90 Base) MCG/ACT inhaler NHALE 2 PUFFS INTO THE LUNGS EVERY 6 (SIX) HOURS AS NEEDED FOR WHEEZING OR SHORTNESS OF BREATH.  Marland Kitchen aspirin EC 81 MG tablet Take 1 tablet (81 mg total) by mouth every evening. Resume 4 days post-op  . [place]oline Fenofibrate (FENOFIBRIC ACID) 135 MG CPDR Take 1 tablet by mouth daily.  . famotidine (PEPCID) 20 MG tablet Take 20 mg by mouth daily as needed for heartburn.  . hydrochlorothiazide (HYDRODIURIL) 25 MG tablet Take 0.5 tablets (12.5 mg total) by mouth daily.  Marland Kitchen  isosorbide mononitrate (IMDUR) 30 MG 24 hr tablet Take 30 mg by mouth daily.  . metoprolol tartrate (LOPRESSOR) 25 MG tablet Take 1 tablet by mouth daily.  . nitroGLYCERIN (NITROSTAT) 0.4 MG SL tablet Place 1 tablet (0.4 mg total) under the tongue every 5 (five) minutes as needed for  chest pain.  . polyethylene glycol (MIRALAX / GLYCOLAX) packet Take 17 g by mouth daily as needed.  . RABEprazole (ACIPHEX) 20 MG tablet Take 20 mg by mouth daily at 3 pm.   . rosuvastatin (CRESTOR) 20 MG tablet Take 1 tablet (20 mg total) by mouth daily.     Allergies:   Amlodipine besylate; Propofol; Lisinopril; Rosuvastatin; and Metoprolol   Social History   Tobacco Use  . Smoking status: Former Smoker    Packs/day: 0.50    Years: 40.00    Pack years: 20.00    Types: Cigarettes    Last attempt to quit: 08/25/2011    Years since quitting: 7.0  . Smokeless tobacco: Never Used  Substance Use Topics  . Alcohol use: Yes    Comment: rare  . Drug use: No     Family Hx: The patient's family history includes Breast cancer in his sister; Diabetes in his mother; Heart disease in his father; Kidney failure in his mother. There is no history of Colon cancer or Stomach cancer.  ROS:   Please see the history of present illness.     All other systems reviewed and are negative.   Prior CV studies:   The following studies were reviewed today:    Labs/Other Tests and Data Reviewed:    EKG:  No ECG reviewed.  Recent Labs: 12/23/2017: ALT 18 01/31/2018: TSH 3.99 06/15/2018: BUN 16; Creatinine, Ser 1.11; Hemoglobin 14.4; Platelets 250; Potassium 4.5; Sodium 136   Recent Lipid Panel Lab Results  Component Value Date/Time   CHOL 155 12/23/2017 04:16 PM   TRIG 155 (H) 12/23/2017 04:16 PM   HDL 49 12/23/2017 04:16 PM   CHOLHDL 3.2 12/23/2017 04:16 PM   CHOLHDL 3.1 01/09/2016 11:27 AM   LDLCALC 75 12/23/2017 04:16 PM   LDLDIRECT 107.0 02/27/2013 09:04 AM    Wt Readings from Last 3 Encounters:  09/14/18 190 lb (86.2 kg)  06/21/18 191 lb 12.8 oz (87 kg)  06/03/18 191 lb (86.6 kg)     Objective:    Vital Signs:  BP 124/73 (BP Location: Left Arm, Patient Position: Sitting, Cuff Size: Normal)   Pulse 70   Ht 5\' 9"  (1.753 m)   Wt 190 lb (86.2 kg)   SpO2 96%   BMI 28.06 kg/m     VITAL SIGNS:  reviewed GEN:  no acute distress EYES:  sclerae anicteric, EOMI - Extraocular Movements Intact RESPIRATORY:  normal respiratory effort, symmetric expansion CARDIOVASCULAR:  no peripheral edema SKIN:  no rash, lesions or ulcers. MUSCULOSKELETAL:  no obvious deformities. NEURO:  alert and oriented x 3, no obvious focal deficit PSYCH:  normal affect  ASSESSMENT & PLAN:    1. Coronary artery disease: [place] overall seems to be doing fairly well.  He does have stable angina.  He has angina when he first starts walking on the treadmill.  He slows down or stops briefly and the angina goes away.  He has not had to take a nitroglycerin.  Will be sent over 20 mg twice a day.  His metoprolol has been stopped and then was started at a lower dose after his pacemaker was placed.  At this point I  think he can tolerate higher dose because he does have the pacemaker as a backup.  He has known moderate to severe coronary artery disease.  If he continues to have angina or if his angina accelerates we need to consider repeat heart catheterization.  2.  Hyperlipidemia: Continue current medications.  Will check lipids at his next office visit.  3.  Spinal hematoma: The patient developed a paraspinal hematoma while on aspirin and Plavix.  He had not injured his back.  His Plavix was stopped after the development of the hematoma and he required surgery.  Plavix has not been restarted.  At this point I think we will continue to stay off Plavix since we do not have a better explanation of why he developed this paraspinal hematoma.  If we need to perform additional interventions we will likely need to restart Plavix or perhaps Brilinta and will need to watch this situation closely.  4.  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  The pacemaker seems to be working well.  Follow-up with Dr. Lovena Le.  COVID-19 Education: The signs and symptoms of COVID-19 were discussed with the patient and  how to seek care for testing (follow up with PCP or arrange E-visit).  The importance of social distancing was discussed today.  Time:   Today, I have spent  26  minutes with the patient with telehealth technology discussing the above problems.     Medication Adjustments/Labs and Tests Ordered: Current medicines are reviewed at length with the patient today.  Concerns regarding medicines are outlined above.   Tests Ordered: No orders of the defined types were placed in this encounter.   Medication Changes: No orders of the defined types were placed in this encounter.   Disposition:  Follow up in 3 month(s)  Signed, Mertie Moores, MD  09/14/2018 8:54 AM    [facility]

## 2018-09-16 ENCOUNTER — Telehealth: Payer: Self-pay

## 2018-09-16 NOTE — Telephone Encounter (Signed)
Canceled appt 08/02/18. Called to reschedule. LVM requesting returned call.

## 2018-09-18 ENCOUNTER — Other Ambulatory Visit: Payer: Self-pay | Admitting: Physician Assistant

## 2018-09-19 NOTE — Telephone Encounter (Signed)
Patient declined rescheduling  

## 2018-09-20 NOTE — Telephone Encounter (Signed)
This medication should be refilled by his PCP. Richardson Dopp, PA-C    09/20/2018 4:18 PM

## 2018-09-22 ENCOUNTER — Encounter: Payer: Self-pay | Admitting: Internal Medicine

## 2018-09-22 ENCOUNTER — Ambulatory Visit (INDEPENDENT_AMBULATORY_CARE_PROVIDER_SITE_OTHER): Payer: BLUE CROSS/BLUE SHIELD | Admitting: Internal Medicine

## 2018-09-22 ENCOUNTER — Other Ambulatory Visit: Payer: Self-pay

## 2018-09-22 VITALS — BP 120/80 | HR 71 | Ht 70.0 in | Wt 193.5 lb

## 2018-09-22 DIAGNOSIS — Z950 Presence of cardiac pacemaker: Secondary | ICD-10-CM | POA: Diagnosis not present

## 2018-09-22 DIAGNOSIS — I10 Essential (primary) hypertension: Secondary | ICD-10-CM | POA: Diagnosis not present

## 2018-09-22 DIAGNOSIS — I2510 Atherosclerotic heart disease of native coronary artery without angina pectoris: Secondary | ICD-10-CM

## 2018-09-22 DIAGNOSIS — I441 Atrioventricular block, second degree: Secondary | ICD-10-CM

## 2018-09-22 DIAGNOSIS — E782 Mixed hyperlipidemia: Secondary | ICD-10-CM

## 2018-09-22 LAB — CUP PACEART INCLINIC DEVICE CHECK
Battery Remaining Longevity: 111 mo
Battery Voltage: 2.99 V
Brady Statistic RA Percent Paced: 16 %
Brady Statistic RV Percent Paced: 99.63 %
Date Time Interrogation Session: 20200604172927
Implantable Lead Implant Date: 20200302
Implantable Lead Implant Date: 20200302
Implantable Lead Location: 753859
Implantable Lead Location: 753860
Implantable Lead Model: 3830
Implantable Pulse Generator Implant Date: 20200302
Lead Channel Impedance Value: 575 Ohm
Lead Channel Impedance Value: 637.5 Ohm
Lead Channel Pacing Threshold Amplitude: 0.75 V
Lead Channel Pacing Threshold Amplitude: 0.75 V
Lead Channel Pacing Threshold Pulse Width: 0.5 ms
Lead Channel Pacing Threshold Pulse Width: 0.5 ms
Lead Channel Sensing Intrinsic Amplitude: 12 mV
Lead Channel Sensing Intrinsic Amplitude: 4.2 mV
Lead Channel Setting Pacing Amplitude: 2 V
Lead Channel Setting Pacing Amplitude: 2.5 V
Lead Channel Setting Pacing Pulse Width: 0.5 ms
Lead Channel Setting Sensing Sensitivity: 2 mV
Pulse Gen Model: 2272
Pulse Gen Serial Number: 9114136

## 2018-09-22 NOTE — Patient Instructions (Addendum)
Medication Instructions:  Your physician recommends that you continue on your current medications as directed. Please refer to the Current Medication list given to you today.  Labwork: None ordered.  Testing/Procedures: None ordered.  Follow-Up: Your physician wants you to follow-up in: 9 months with Dr. Lovena Le in [place].   You will receive a reminder letter in the mail two months in advance. If you don't receive a letter, please call our office to schedule the follow-up appointment.  Remote monitoring is used to monitor your Pacemaker from home. This monitoring reduces the number of office visits required to check your device to one time per year. It allows [place] to keep an eye on the functioning of your device to ensure it is working properly. You are scheduled for a device check from home on 12/22/2018. You may send your transmission at any time that day. If you have a wireless device, the transmission will be sent automatically. After your physician reviews your transmission, you will receive a postcard with your next transmission date.  Any Other Special Instructions Will Be Listed Below (If Applicable).  If you need a refill on your cardiac medications before your next appointment, please call your pharmacy.

## 2018-09-22 NOTE — Progress Notes (Signed)
HPI Daniel Carey returns today for followup. He is a pleasant 5 man who has a h/o CHB/high grade heart block, HTN, and CAD. He underwent PPM insertion several months ago. He has done well in the interim. He notes that he does well as long as he does not try to get in a hurry. No anginal symptoms.  Allergies  Allergen Reactions  . Amlodipine Besylate Other (See Comments)    Headache, weakness, dizziness  . Propofol Other (See Comments)    Heart rate dropped    . Lisinopril Cough and Rash  . Rosuvastatin Rash and Other (See Comments)     muscle aches   . Metoprolol     Lowered heart rate too much      Current Outpatient Medications  Medication Sig Dispense Refill  . acetaminophen (TYLENOL) 500 MG tablet Take 1,000 mg by mouth every 6 (six) hours as needed (pain).     Marland Kitchen albuterol (VENTOLIN HFA) 108 (90 Base) MCG/ACT inhaler NHALE 2 PUFFS INTO THE LUNGS EVERY 6 (SIX) HOURS AS NEEDED FOR WHEEZING OR SHORTNESS OF BREATH. 8.5 Inhaler 0  . aspirin EC 81 MG tablet Take 1 tablet (81 mg total) by mouth every evening. Resume 4 days post-op    . Choline Fenofibrate (FENOFIBRIC ACID) 135 MG CPDR Take 1 tablet by mouth daily. 30 capsule 11  . famotidine (PEPCID) 20 MG tablet Take 20 mg by mouth daily as needed for heartburn.    . hydrochlorothiazide (HYDRODIURIL) 25 MG tablet Take 0.5 tablets (12.5 mg total) by mouth daily. 45 tablet 3  . isosorbide mononitrate (IMDUR) 30 MG 24 hr tablet Take 30 mg by mouth daily.    . metoprolol tartrate (LOPRESSOR) 25 MG tablet Take 1 tablet (25 mg total) by mouth 2 (two) times daily. 180 tablet 3  . nitroGLYCERIN (NITROSTAT) 0.4 MG SL tablet Place 1 tablet (0.4 mg total) under the tongue every 5 (five) minutes as needed for chest pain. 25 tablet 4  . polyethylene glycol (MIRALAX / GLYCOLAX) packet Take 17 g by mouth daily as needed. 14 each 0  . RABEprazole (ACIPHEX) 20 MG tablet Take 20 mg by mouth daily at 3 pm.     . rosuvastatin (CRESTOR) 20 MG  tablet Take 1 tablet (20 mg total) by mouth daily. 30 tablet 11   No current facility-administered medications for this visit.      Past Medical History:  Diagnosis Date  . 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 CAD (coronary artery disease)    moderate to severe distal CAD with large epicardial arteries having only mild to moderate irregularities. Last cath in Dec 2006; s/p cath with Promus DES in distal RCA and Promus DES in the mid vessel in an overlapping fashion  . Chest pain   . Complication of anesthesia    heart rate dropping with propofol last procedure  . COPD (chronic obstructive pulmonary disease) ([place])   . ED (erectile dysfunction)   . Esophageal stricture   . External hemorrhoids without mention of complication   . GERD (gastroesophageal reflux disease)   . Headache(784.0)   . Hiatal hernia   . History of kidney stones   . HLD (hyperlipidemia)   . HTN (hypertension)   . Myocardial infarction ([place])    1996  . Personal history of colonic polyps 03/12/2010   TUBULAR ADENOMA  . Tobacco abuse     ROS:   All systems reviewed  and negative except as noted in the HPI.   Past Surgical History:  Procedure Laterality Date  . ANGIOPLASTY     X3  . COLONOSCOPY N/A 02/16/2017   Procedure: COLONOSCOPY;  Surgeon: Jerene Bears, MD;  Location: [facility];  Service: Gastroenterology;  Laterality: N/A;  . INSERT / REPLACE / REMOVE PACEMAKER  06/20/2018  . LEFT HEART CATH AND CORONARY ANGIOGRAPHY N/A 11/05/2016   Procedure: Left Heart Cath and Coronary Angiography;  Surgeon: Nelva Bush, MD;  Location: [facility];  Service: Cardiovascular;  Laterality: N/A;  . LUMBAR LAMINECTOMY/DECOMPRESSION MICRODISCECTOMY N/A 05/12/2018   Procedure: Microlumbar decompression L2-3, possible L1-2;  Surgeon: Susa Day, MD;  Location: [facility];  Service: Orthopedics;  Laterality: N/A;  2 hrs, Microlumbar decompression L2-3, possible L1-2  . PACEMAKER IMPLANT N/A  06/20/2018   Procedure: PACEMAKER IMPLANT;  Surgeon: Evans Lance, MD;  Location: [place] [facility];  Service: Cardiovascular;  Laterality: N/A;  . PERCUTANEOUS CORONARY ROTOBLATOR INTERVENTION (PCI-R) N/A 06/30/2012   Procedure: PERCUTANEOUS CORONARY ROTOBLATOR INTERVENTION (PCI-R);  Surgeon: Burnell Blanks, MD;  Location: [facility];  Service: Cardiovascular;  Laterality: N/A;  . VIDEO BRONCHOSCOPY  08/13/2011   Procedure: VIDEO BRONCHOSCOPY WITHOUT FLUORO;  Surgeon: Tanda Rockers, MD;  [facility]: [facility];  Service: Cardiopulmonary;  Laterality: Bilateral;     Family History  Problem Relation Age of Onset  . Heart disease Father        hx of MI  . Diabetes Mother        and brother   . Kidney failure Mother   . Breast cancer Sister   . Colon cancer Neg Hx   . Stomach cancer Neg Hx      Social History   Socioeconomic History  . Marital status: Married    Spouse name: Not on file  . Number of children: 1  . Years of education: Not on file  . Highest education level: Not on file  Occupational History  . Occupation: Radio broadcast assistant  Social Needs  . Financial resource strain: Not on file  . Food insecurity:    Worry: Not on file    Inability: Not on file  . Transportation needs:    Medical: Not on file    Non-medical: Not on file  Tobacco Use  . Smoking status: Former Smoker    Packs/day: 0.50    Years: 40.00    Pack years: 20.00    Types: Cigarettes    Last attempt to quit: 08/25/2011    Years since quitting: 7.0  . Smokeless tobacco: Never Used  Substance and Sexual Activity  . Alcohol use: Yes    Comment: rare  . Drug use: No  . Sexual activity: Not Currently  Lifestyle  . Physical activity:    Days per week: Not on file    Minutes per session: Not on file  . Stress: Not on file  Relationships  . Social connections:    Talks on phone: Not on file    Gets together: Not on file    Attends religious service: Not on file    Active member  of club or organization: Not on file    Attends meetings of clubs or organizations: Not on file    Relationship status: Not on file  . Intimate partner violence:    Fear of current or ex partner: Not on file    Emotionally abused: Not on file    Physically abused: Not on file  Forced sexual activity: Not on file  Other Topics Concern  . Not on file  Social History Narrative  . Not on file     BP 120/80   Pulse 71   Ht 5\' 10"  (1.778 m)   Wt 193 lb 8 oz (87.8 kg)   SpO2 97%   BMI 27.76 kg/m   Physical Exam:  Well appearing NAD HEENT: Unremarkable Neck:  No JVD, no thyromegally Lymphatics:  No adenopathy Back:  No CVA tenderness Lungs:  Clear with no wheezes. Well healed PPM incision. HEART:  Regular rate rhythm, no murmurs, no rubs, no clicks Abd:  soft, positive bowel sounds, no organomegally, no rebound, no guarding Ext:  2 plus pulses, no edema, no cyanosis, no clubbing Skin:  No rashes no nodules Neuro:  CN II through XII intact, motor grossly intact  EKG - nsr with ventricular pacing  DEVICE  Normal device function.  See PaceArt for details.   Assess/Plan: 1. High grade heart block - he is doing well, s/p PPM insertion. 2. CAD - his symptoms are well controlled. He denies anginal symptoms.  3. HTN - his bp is well controlled. He will continue his current meds.  4. Dyslipidemia - he is tolerating his statin therapy.   Daniel Carey.D.

## 2018-11-26 ENCOUNTER — Other Ambulatory Visit: Payer: Self-pay | Admitting: Physician Assistant

## 2018-11-28 NOTE — Telephone Encounter (Signed)
He really should have that refilled by his PCP. Please see if he can get it refilled with Dione Housekeeper, MD. Richardson Dopp, PA-C    11/28/2018 4:13 PM

## 2018-12-02 ENCOUNTER — Ambulatory Visit (INDEPENDENT_AMBULATORY_CARE_PROVIDER_SITE_OTHER): Payer: BC Managed Care – PPO | Admitting: Family Medicine

## 2018-12-02 ENCOUNTER — Encounter: Payer: Self-pay | Admitting: Family Medicine

## 2018-12-02 ENCOUNTER — Other Ambulatory Visit: Payer: Self-pay

## 2018-12-02 VITALS — BP 124/70 | HR 62 | Temp 98.4°F | Ht 70.0 in | Wt 192.5 lb

## 2018-12-02 DIAGNOSIS — I10 Essential (primary) hypertension: Secondary | ICD-10-CM

## 2018-12-02 DIAGNOSIS — I459 Conduction disorder, unspecified: Secondary | ICD-10-CM

## 2018-12-02 DIAGNOSIS — M48061 Spinal stenosis, lumbar region without neurogenic claudication: Secondary | ICD-10-CM

## 2018-12-02 DIAGNOSIS — E785 Hyperlipidemia, unspecified: Secondary | ICD-10-CM

## 2018-12-02 DIAGNOSIS — K219 Gastro-esophageal reflux disease without esophagitis: Secondary | ICD-10-CM

## 2018-12-02 DIAGNOSIS — I25119 Atherosclerotic heart disease of native coronary artery with unspecified angina pectoris: Secondary | ICD-10-CM

## 2018-12-02 DIAGNOSIS — Z6827 Body mass index (BMI) 27.0-27.9, adult: Secondary | ICD-10-CM | POA: Diagnosis not present

## 2018-12-02 DIAGNOSIS — J449 Chronic obstructive pulmonary disease, unspecified: Secondary | ICD-10-CM

## 2018-12-02 DIAGNOSIS — E663 Overweight: Secondary | ICD-10-CM | POA: Diagnosis not present

## 2018-12-02 MED ORDER — ALBUTEROL SULFATE HFA 108 (90 BASE) MCG/ACT IN AERS: 2.0000 | INHALATION_SPRAY | Freq: Four times a day (QID) | RESPIRATORY_TRACT | 1 refills | Status: DC | PRN

## 2018-12-02 MED ORDER — RABEPRAZOLE SODIUM 20 MG PO TBEC: 20.0000 mg | DELAYED_RELEASE_TABLET | Freq: Every day | ORAL | 11 refills | Status: DC

## 2018-12-02 NOTE — Assessment & Plan Note (Signed)
Stable.  At goal today.  Continue HCTZ 12.5 mg daily and metoprolol tartrate 25 mg twice daily.

## 2018-12-02 NOTE — Assessment & Plan Note (Signed)
Stable.  Albuterol refilled. ?

## 2018-12-02 NOTE — Assessment & Plan Note (Signed)
Stable.  Continue management per EP.

## 2018-12-02 NOTE — Progress Notes (Signed)
Chief Complaint:  Daniel Carey is a 68 y.o. male who presents today with a chief complaint of essential hypertension and to establish care.   Assessment/Plan:  Heart block Stable.  Continue management per EP.  COPD mixed type ([place]) Stable.  Albuterol refilled.  Spinal stenosis of lumbar region   Stable.  No red flags.  Continue management per orthopedics.  GERD Stable.  AcipHex refilled.  CAD (coronary artery disease) Stable.  Continue statin and aspirin.  Essential hypertension Stable.  At goal today.  Continue HCTZ 12.5 mg daily and metoprolol tartrate 25 mg twice daily.  Hyperlipidemia Continue Crestor 20 mg daily.  Preventative Healthcare Patient was instructed to return soon for CPE.  Due for pneumonia vaccine series. Health Maintenance Due  Topic Date Due  . Hepatitis C Screening  1951/02/14  . URINE MICROALBUMIN  11/06/1961  . INFLUENZA VACCINE  11/19/2018     Subjective:  HPI:  His stable, chronic medical conditions are outlined below:  # CAD / Dyslipidemia / Essential Hypertension - Follows with cardiology - On crestor 20mg  daily, fenofibrate 135mg  daily, and aspirin 81mg  daily - On metoprolol tartrate 25mg  twice daily, imdur 30mg  daily, and HCTZ 12/5mg  daily - ROS: No reported chest pain or shortness of breath  # Heart Block s/p Pacer placement 2020 - Follows with EP  # COPD - Uses albuterol as needed  # GERD - On pepcid 20mg  daily as needed and aciphex 20mg  daily. Tolerating both well without side effects  # Chronic back pain 2/2 Degenerative Disc Disease s/p Laminectomy 2020 - Follows with neurosurgery  ROS: Per HPI, otherwise a complete review of systems was negative.   PMH:  The following were reviewed and entered/updated in epic: Past Medical History:  Diagnosis Date  . 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 CAD (coronary artery disease)    moderate to severe distal CAD with large epicardial arteries having only mild  to moderate irregularities. Last cath in Dec 2006; s/p cath with Promus DES in distal RCA and Promus DES in the mid vessel in an overlapping fashion  . Chest pain   . Complication of anesthesia    heart rate dropping with propofol last procedure  . COPD (chronic obstructive pulmonary disease) ([place])   . ED (erectile dysfunction)   . Esophageal stricture   . External hemorrhoids without mention of complication   . GERD (gastroesophageal reflux disease)   . Headache(784.0)   . Hiatal hernia   . History of kidney stones   . HLD (hyperlipidemia)   . HTN (hypertension)   . Myocardial infarction ([place])    1996  . Personal history of colonic polyps 03/12/2010   TUBULAR ADENOMA  . Tobacco abuse    Patient Active Problem List   Diagnosis Date Noted  . COPD mixed type ([place]) 12/02/2018  . Spinal stenosis of lumbar region 05/12/2018  . Obstructive sleep apnea 04/26/2014  . Heart block 07/28/2011  . Diverticulosis of colon (without mention of hemorrhage) 07/08/2011  . History of colonic polyps 12/23/2010  . HIATAL HERNIA 12/22/2007  . Hyperlipidemia 12/30/2006  . Essential hypertension 12/30/2006  . CAD (coronary artery disease) 12/30/2006  . GERD 12/30/2006   Past Surgical History:  Procedure Laterality Date  . ANGIOPLASTY     X3  . COLONOSCOPY N/A 02/16/2017   Procedure: COLONOSCOPY;  Surgeon: Jerene Bears, MD;  [facility];  Service: Gastroenterology;  Laterality: N/A;  . INSERT / REPLACE /  REMOVE PACEMAKER  06/20/2018  . LEFT HEART CATH AND CORONARY ANGIOGRAPHY N/A 11/05/2016   Procedure: Left Heart Cath and Coronary Angiography;  Surgeon: Nelva Bush, MD;  Location: [place] [facility];  Service: Cardiovascular;  Laterality: N/A;  . LUMBAR LAMINECTOMY/DECOMPRESSION MICRODISCECTOMY N/A 05/12/2018   Procedure: Microlumbar decompression L2-3, possible L1-2;  Surgeon: Susa Day, MD;  Location: [place];  Service: Orthopedics;  Laterality: N/A;  2 hrs, Microlumbar  decompression L2-3, possible L1-2  . PACEMAKER IMPLANT N/A 06/20/2018   Procedure: PACEMAKER IMPLANT;  Surgeon: Evans Lance, MD;  Location: [place] [facility];  Service: Cardiovascular;  Laterality: N/A;  . PERCUTANEOUS CORONARY ROTOBLATOR INTERVENTION (PCI-R) N/A 06/30/2012   Procedure: PERCUTANEOUS CORONARY ROTOBLATOR INTERVENTION (PCI-R);  Surgeon: Burnell Blanks, MD;  Location: [facility] LAB;  Service: Cardiovascular;  Laterality: N/A;  . VIDEO BRONCHOSCOPY  08/13/2011   Procedure: VIDEO BRONCHOSCOPY WITHOUT FLUORO;  Surgeon: Tanda Rockers, MD;  [facility];  Service: Cardiopulmonary;  Laterality: Bilateral;    Family History  Problem Relation Age of Onset  . Heart disease Father        hx of MI  . Diabetes Mother        and brother   . Kidney failure Mother   . Breast cancer Sister   . Colon cancer Neg Hx   . Stomach cancer Neg Hx     Medications- reviewed and updated Current Outpatient Medications  Medication Sig Dispense Refill  . acetaminophen (TYLENOL) 500 MG tablet Take 1,000 mg by mouth every 6 (six) hours as needed (pain).     Marland Kitchen albuterol (VENTOLIN HFA) 108 (90 Base) MCG/ACT inhaler Inhale 2 puffs into the lungs every 6 (six) hours as needed for wheezing or shortness of breath. 18 g 1  . aspirin EC 81 MG tablet Take 1 tablet (81 mg total) by mouth every evening. Resume 4 days post-op    . Choline Fenofibrate (FENOFIBRIC ACID) 135 MG CPDR Take 1 tablet by mouth daily. 30 capsule 11  . famotidine (PEPCID) 20 MG tablet Take 20 mg by mouth daily as needed for heartburn.    . hydrochlorothiazide (HYDRODIURIL) 25 MG tablet Take 0.5 tablets (12.5 mg total) by mouth daily. 45 tablet 3  . isosorbide mononitrate (IMDUR) 30 MG 24 hr tablet Take 30 mg by mouth daily.    . metoprolol tartrate (LOPRESSOR) 25 MG tablet Take 1 tablet (25 mg total) by mouth 2 (two) times daily. 180 tablet 3  . nitroGLYCERIN (NITROSTAT) 0.4 MG SL tablet Place 1 tablet (0.4 mg total) under  the tongue every 5 (five) minutes as needed for chest pain. 25 tablet 4  . polyethylene glycol (MIRALAX / GLYCOLAX) packet Take 17 g by mouth daily as needed. 14 each 0  . RABEprazole (ACIPHEX) 20 MG tablet Take 1 tablet (20 mg total) by mouth daily at 3 pm. 30 tablet 11  . rosuvastatin (CRESTOR) 20 MG tablet Take 1 tablet (20 mg total) by mouth daily. 30 tablet 11   No current facility-administered medications for this visit.     Allergies-reviewed and updated Allergies  Allergen Reactions  . Amlodipine Besylate Other (See Comments)    Headache, weakness, dizziness  . Propofol Other (See Comments)    Heart rate dropped    . Lisinopril Cough and Rash  . Rosuvastatin Rash and Other (See Comments)     muscle aches   . Metoprolol     Lowered heart rate too much     Social  History   Socioeconomic History  . Marital status: Married    Spouse name: Not on file  . Number of children: 1  . Years of education: Not on file  . Highest education level: Not on file  Occupational History  . Occupation: Radio broadcast assistant  Social Needs  . Financial resource strain: Not on file  . Food insecurity    Worry: Not on file    Inability: Not on file  . Transportation needs    Medical: Not on file    Non-medical: Not on file  Tobacco Use  . Smoking status: Former Smoker    Packs/day: 0.50    Years: 40.00    Pack years: 20.00    Types: Cigarettes    Quit date: 08/25/2011    Years since quitting: 7.2  . Smokeless tobacco: Never Used  Substance and Sexual Activity  . Alcohol use: Yes    Comment: rare  . Drug use: No  . Sexual activity: Not Currently  Lifestyle  . Physical activity    Days per week: Not on file    Minutes per session: Not on file  . Stress: Not on file  Relationships  . Social Herbalist on phone: Not on file    Gets together: Not on file    Attends religious service: Not on file    Active member of club or organization: Not on file    Attends  meetings of clubs or organizations: Not on file    Relationship status: Not on file  Other Topics Concern  . Not on file  Social History Narrative  . Not on file        Objective:  Physical Exam: BP 124/70   Pulse 62   Temp 98.4 F (36.9 C)   Ht 5\' 10"  (1.778 m)   Wt 192 lb 8 oz (87.3 kg)   SpO2 97%   BMI 27.62 kg/m   Gen: NAD, resting comfortably CV: Regular rate and rhythm with no murmurs appreciated Pulm: Normal work of breathing, clear to auscultation bilaterally with no crackles, wheezes, or rhonchi GI: Normal bowel sounds present. Soft, Nontender, Nondistended. MSK: No edema, cyanosis, or clubbing noted Skin: Warm, dry Neuro: Grossly normal, moves all extremities Psych: Normal affect and thought content      Seniya Stoffers M. Jerline Pain, MD 12/02/2018 1:46 PM

## 2018-12-02 NOTE — Assessment & Plan Note (Signed)
Stable.  AcipHex refilled.

## 2018-12-02 NOTE — Assessment & Plan Note (Signed)
Continue Crestor 20 mg daily. 

## 2018-12-02 NOTE — Patient Instructions (Signed)
It was very nice to see you today!  I will send in your medications.  Come back soon for your physical, or sooner as needed.  Take care, Dr Jerline Pain  Please try these tips to maintain a healthy lifestyle:   Eat at least 3 REAL meals and 1-2 snacks per day.  Aim for no more than 5 hours between eating.  If you eat breakfast, please do so within one hour of getting up.    Obtain twice as many fruits/vegetables as protein or carbohydrate foods for both lunch and dinner. (Half of each meal should be fruits/vegetables, one quarter protein, and one quarter starchy carbs)   Cut down on sweet beverages. This includes juice, soda, and sweet tea.    Exercise at least 150 minutes every week.

## 2018-12-02 NOTE — Assessment & Plan Note (Signed)
Stable.  Continue statin and aspirin. 

## 2018-12-02 NOTE — Assessment & Plan Note (Signed)
Stable.  No red flags.  Continue management per orthopedics.

## 2018-12-12 ENCOUNTER — Other Ambulatory Visit: Payer: Self-pay

## 2018-12-12 ENCOUNTER — Other Ambulatory Visit: Payer: BC Managed Care – PPO | Admitting: *Deleted

## 2018-12-12 DIAGNOSIS — I25118 Atherosclerotic heart disease of native coronary artery with other forms of angina pectoris: Secondary | ICD-10-CM | POA: Diagnosis not present

## 2018-12-12 DIAGNOSIS — I441 Atrioventricular block, second degree: Secondary | ICD-10-CM

## 2018-12-12 DIAGNOSIS — E782 Mixed hyperlipidemia: Secondary | ICD-10-CM

## 2018-12-12 LAB — LIPID PANEL
Chol/HDL Ratio: 3.2 ratio (ref 0.0–5.0)
Cholesterol, Total: 158 mg/dL (ref 100–199)
HDL: 50 mg/dL (ref >39)
LDL Calculated: 83 mg/dL (ref 0–99)
Triglycerides: 127 mg/dL (ref 0–149)
VLDL Cholesterol Cal: 25 mg/dL (ref 5–40)

## 2018-12-12 LAB — BASIC METABOLIC PANEL
BUN/Creatinine Ratio: 14 (ref 10–24)
BUN: 16 mg/dL (ref 8–27)
CO2: 24 mmol/L (ref 20–29)
Calcium: 9.7 mg/dL (ref 8.6–10.2)
Chloride: 102 mmol/L (ref 96–106)
Creatinine, Ser: 1.12 mg/dL (ref 0.76–1.27)
GFR calc Af Amer: 78 mL/min/{1.73_m2} (ref >59)
GFR calc non Af Amer: 68 mL/min/{1.73_m2} (ref >59)
Glucose: 142 mg/dL — ABNORMAL HIGH (ref 65–99)
Potassium: 4.7 mmol/L (ref 3.5–5.2)
Sodium: 139 mmol/L (ref 134–144)

## 2018-12-12 LAB — HEPATIC FUNCTION PANEL
ALT: 17 IU/L (ref 0–44)
AST: 13 IU/L (ref 0–40)
Albumin: 4.2 g/dL (ref 3.8–4.8)
Alkaline Phosphatase: 59 IU/L (ref 39–117)
Bilirubin Total: 0.5 mg/dL (ref 0.0–1.2)
Bilirubin, Direct: 0.17 mg/dL (ref 0.00–0.40)
Total Protein: 6.7 g/dL (ref 6.0–8.5)

## 2018-12-21 ENCOUNTER — Ambulatory Visit (INDEPENDENT_AMBULATORY_CARE_PROVIDER_SITE_OTHER): Payer: BC Managed Care – PPO | Admitting: Cardiovascular Disease

## 2018-12-21 ENCOUNTER — Other Ambulatory Visit: Payer: Self-pay

## 2018-12-21 ENCOUNTER — Encounter: Payer: Self-pay | Admitting: Cardiovascular Disease

## 2018-12-21 VITALS — BP 122/86 | HR 70 | Ht 70.0 in | Wt 191.0 lb

## 2018-12-21 DIAGNOSIS — E782 Mixed hyperlipidemia: Secondary | ICD-10-CM | POA: Diagnosis not present

## 2018-12-21 DIAGNOSIS — I25119 Atherosclerotic heart disease of native coronary artery with unspecified angina pectoris: Secondary | ICD-10-CM

## 2018-12-21 DIAGNOSIS — I10 Essential (primary) hypertension: Secondary | ICD-10-CM

## 2018-12-21 MED ORDER — METOPROLOL TARTRATE 25 MG PO TABS: 25.0000 mg | ORAL_TABLET | Freq: Two times a day (BID) | ORAL | 3 refills | Status: DC

## 2018-12-21 NOTE — Patient Instructions (Signed)
Medication Instructions:  Ok to take 1 extra metoprolol about an hour before exercise to help control anginal symptoms  If you need a refill on your cardiac medications before your next appointment, please call your pharmacy.   Lab work: Lipids/liver/bmet in 6 months prior to next visit with Dr. Acie Fredrickson  If you have labs (blood work) drawn today and your tests are completely normal, you will receive your results only by: Marland Kitchen MyChart Message (if you have MyChart) OR . A paper copy in the mail If you have any lab test that is abnormal or we need to change your treatment, we will call you to review the results.  Testing/Procedures: none  Follow-Up: At [facility], you and your health needs are our priority.  As part of our continuing mission to provide you with exceptional heart care, we have created designated Provider Care Teams.  These Care Teams include your primary Cardiologist (physician) and Advanced Practice Providers (APPs -  Physician Assistants and Nurse Practitioners) who all work together to provide you with the care you need, when you need it. You will need a follow up appointment in:  6 months.  Please call our office 2 months in advance to schedule this appointment.  You may see Mertie Moores, MD or one of the following Advanced Practice Providers on your designated Care Team: Richardson Dopp, PA-C [place], [place] . Daune Perch, NP  Any Other Special Instructions Will Be Listed Below (If Applicable).

## 2018-12-21 NOTE — Progress Notes (Signed)
[name] Date of Birth  1951/04/06       Daniel Carey [address] [place], [place], [place]  [address]   [place], [place]  [address] [phone]     [phone]   Fax  [phone]    Fax [phone]  Problem List: 1. CAD - 2.5 x 38 mm Promus Premier DES in the distal RCA and a 2.75 x 38 mm Promus Premier DES in the mid vessel in an overlapping fashion. The distal stent was post-dilated with a 2.5 x 20 mm [place] balloon. The proximal portion of the stent was post-dilated with a 3.0 x 20 mm [place] balloon ( June 30, 2012)  2. Bradycardia after getting propafol for endoscopy. 3. Obstructive sleep apnea:   :  Daniel Carey is feeling well today.  He's been getting some exercise. He does yard work and walks on the treadmill occasionally. He had an episode of bradycardia after getting IV protocol for an endoscopy procedure. He was seen by [name]. His metoprolol dose was decreased. He has also had a bronchoscopy with [name]. He does not have any evidence of COPD.  Both he and his wife have stop smoking.  ( Aug 20, 2011)  Feb. 28, 2014:  He continues to have increasing chest pain.  He has not been smoking.  The pains are associated with exertion, last 5-10 minutes, resolve with 1 SL NTG.   He's had a mildly elevated TSH for the past year or so and has had some fatigue.    his TSH level 6.8.      His medical doctor started him on Synthroid. He gradually has titrated up to 0.1 mg a day. Since that time he's had increasing episodes of chest pain.  Aug 29, 2012:  Daniel Carey saw him in February we performed a cardiac catheterization because of increasing chest pain. He was found to have sequential stenosis in his mid right coronary artery and had PCI of his right coronary artery ( 2.5 x 38 mm Promus Premier DES in the distal RCA and a 2.75 x 38 mm Promus Premier DES in the mid vessel in an overlapping fashion. The distal sten was post-dilated  with a 2.5 x 20 mm [place] balloon. The proximal portion of the stent was post-dilated with a 3.0 x 20 mm [place] balloon)  He and his wife are concerned about whether or not the Plavix is active.  His generic plavix manufacturer has changed.   He would like to be tested.   October 14, 2012:  Daniel Carey has been having some occasional CP.  He has had some episodes of dyspnea while climbing up a hill playing golf.  This past Monday night, he had some chest pain - possible due to indigestions.  these  He had lost his NTG - he has refilled it now.    Nov. 10, 2014:  Daniel Carey is doing well.  He is working too much according to wife. he's had a few CP but not severe enough to take NTG.  Still has some dyspnea with climbing steps.   He had stenting in march 2014.  October 23, 2013:  Daniel Carey is doing well.   Playing golf.  Working in the yard.  Not exercising as much.   He has lost about 6 lbs.  He had labs drawn today.     Jan. 7, 2016:  He has been waking up in the middle  of the night and feels like he cant catch his  Breath. Still working out - on the treadmill twice this week and has done well.   Sept. 1, 2016:  Has had some short of breath. Does not get any exercise.  His CP is much better since starting CPAP .  Is having bloating  - thinks its due to the lipitor   July 19, 2015: Has had some shortness of breath.   With exertion . Happens occasionally but seem to be getting more frequently .  BP is elevated today  Has cut back on his salt .   Sept. 21, 2017:  Daniel Carey is seen today for follow up visit for his CAD Occasionally short of breath . Comes and goes .  BP is a little elevated today Has occasional CP  Able to do all that he wants to do   November 04, 2016: Still having some chest pain Not exertional ,  Seems very similar to his previous episodes of angina prior to stenting .   July 31 , 2108  Daniel Carey is seen today for follow up of a recent cath.  He was found to have diffuse moderate -  severe CAD - especially in the LAD  Was started on Imdur - is having a headache with that. Angina seems to be slightly  better.  He still has marked DOE - wife is very concerned about this .   is going to see pulmonary soon .  Sept. 24, 2018:  Daniel Carey is doing very well. His chest pain seemed to be better. Getting some exercise   A little bit more salt than usual over the weekend. His diastolic blood pressure today is 90.  Nov. 8, 2018:    Daniel Carey is seen today  Has worsening dyspnea.   Chest tightness Also had a bladder infection  - burning with urination. ,   Fevers, chills.   Temp of 99.8 Still having fevers and chills  His dyspnea seems to be worse with his chills.  No blood in urine,   extremy dysuria and frequency + cough , no hemoptysis, No pleuretic CP  No real angina   April 08, 2017:  Daniel Carey is seen back for follow up visit Has urosepsis when I last saw him ( UTI with bacteremia )  Treated with ABx Now is having dysuria .   Is seeing urologist tomorrow .   No serious angina .   December 23, 2017:  Daniel Carey is seen today for follow-up of his coronary artery disease. Had a colonoscopy  Complicated by a E.coli UTI when I last saw him  Rare episodes of CP   Sept. 2, 2020   Doing well Had a pacer placed a month ago  Feels beter Has some CP if he starts walks on the treadmill.   Is able to walk through it and usually does ok  Lasts for a minutes.  Last cath was July 2019.  Conclusions: 1. Multivessel coronary artery disease, including diffuse calcified LAD disease of up to 60-70% (FFR 0.73), moderate to severe LCx and OM3 disease (FFR 0.83 at submaximal hyperemia), and moderate in-stent restenosis of mid RCA. 2. Normal left ventricular filling pressure. 3. Normal left ventricular systolic function.  Recommendations: 1. Escalate antianginal therapy with addition of isosorbide mononitrate. 2. Continue aggressive secondary prevention, including dual antiplatelet and  statin therapy. 3. I will review the films with the intervention and cardiac surgery teams. Revascularization options include atherectomy and PCI to LAD (would  require multiple stents) versus CABG.  He has found that if he takes an extra metoprolol about an hour before he exercises that his angina is much better controlled.  We will have him continue that dosing scheme.    Current Outpatient Medications on File Prior to Visit  Medication Sig Dispense Refill  . acetaminophen (TYLENOL) 500 MG tablet Take 1,000 mg by mouth every 6 (six) hours as needed (pain).     Marland Kitchen albuterol (VENTOLIN HFA) 108 (90 Base) MCG/ACT inhaler Inhale 2 puffs into the lungs every 6 (six) hours as needed for wheezing or shortness of breath. 18 g 1  . aspirin EC 81 MG tablet Take 1 tablet (81 mg total) by mouth every evening. Resume 4 days post-op    . Choline Fenofibrate (FENOFIBRIC ACID) 135 MG CPDR Take 1 tablet by mouth daily. 30 capsule 11  . famotidine (PEPCID) 20 MG tablet Take 20 mg by mouth daily as needed for heartburn.    . hydrochlorothiazide (HYDRODIURIL) 25 MG tablet Take 0.5 tablets (12.5 mg total) by mouth daily. 45 tablet 3  . isosorbide mononitrate (IMDUR) 30 MG 24 hr tablet Take 30 mg by mouth daily.    . metoprolol tartrate (LOPRESSOR) 25 MG tablet Take 1 tablet (25 mg total) by mouth 2 (two) times daily. 180 tablet 3  . nitroGLYCERIN (NITROSTAT) 0.4 MG SL tablet Place 1 tablet (0.4 mg total) under the tongue every 5 (five) minutes as needed for chest pain. 25 tablet 4  . polyethylene glycol (MIRALAX / GLYCOLAX) packet Take 17 g by mouth daily as needed. 14 each 0  . RABEprazole (ACIPHEX) 20 MG tablet Take 1 tablet (20 mg total) by mouth daily at 3 pm. 30 tablet 11  . rosuvastatin (CRESTOR) 20 MG tablet Take 1 tablet (20 mg total) by mouth daily. 30 tablet 11   No current facility-administered medications on file prior to visit.     Allergies  Allergen Reactions  . Amlodipine Besylate Other (See  Comments)    Headache, weakness, dizziness  . Propofol Other (See Comments)    Heart rate dropped    . Lisinopril Cough and Rash  . Rosuvastatin Rash and Other (See Comments)     muscle aches   . Metoprolol     Lowered heart rate too much     Past Medical History:  Diagnosis Date  . 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 CAD (coronary artery disease)    moderate to severe distal CAD with large epicardial arteries having only mild to moderate irregularities. Last cath in Dec 2006; s/p cath with Promus DES in distal RCA and Promus DES in the mid vessel in an overlapping fashion  . Chest pain   . Complication of anesthesia    heart rate dropping with propofol last procedure  . COPD (chronic obstructive pulmonary disease) ([place])   . ED (erectile dysfunction)   . Esophageal stricture   . External hemorrhoids without mention of complication   . GERD (gastroesophageal reflux disease)   . Headache(784.0)   . Hiatal hernia   . History of kidney stones   . HLD (hyperlipidemia)   . HTN (hypertension)   . Myocardial infarction ([place])    1996  . Personal history of colonic polyps 03/12/2010   TUBULAR ADENOMA  . Tobacco abuse     Past Surgical History:  Procedure Laterality Date  . ANGIOPLASTY     X3  . COLONOSCOPY N/A 02/16/2017  Procedure: COLONOSCOPY;  Surgeon: Jerene Bears, MD;  Location: [facility];  Service: Gastroenterology;  Laterality: N/A;  . INSERT / REPLACE / REMOVE PACEMAKER  06/20/2018  . LEFT HEART CATH AND CORONARY ANGIOGRAPHY N/A 11/05/2016   Procedure: Left Heart Cath and Coronary Angiography;  Surgeon: Nelva Bush, MD;  Location: [facility];  Service: Cardiovascular;  Laterality: N/A;  . LUMBAR LAMINECTOMY/DECOMPRESSION MICRODISCECTOMY N/A 05/12/2018   Procedure: Microlumbar decompression L2-3, possible L1-2;  Surgeon: Susa Day, MD;  Location: [facility];  Service: Orthopedics;  Laterality: N/A;  2 hrs, Microlumbar decompression L2-3,  possible L1-2  . PACEMAKER IMPLANT N/A 06/20/2018   Procedure: PACEMAKER IMPLANT;  Surgeon: Evans Lance, MD;  Location: [facility];  Service: Cardiovascular;  Laterality: N/A;  . PERCUTANEOUS CORONARY ROTOBLATOR INTERVENTION (PCI-R) N/A 06/30/2012   Procedure: PERCUTANEOUS CORONARY ROTOBLATOR INTERVENTION (PCI-R);  Surgeon: Burnell Blanks, MD;  Location[facility];  Service: Cardiovascular;  Laterality: N/A;  . VIDEO BRONCHOSCOPY  08/13/2011   Procedure: VIDEO BRONCHOSCOPY WITHOUT FLUORO;  Surgeon: Tanda Rockers, MD;  Location: [facility];  Service: Cardiopulmonary;  Laterality: Bilateral;    Social History   Tobacco Use  Smoking Status Former Smoker  . Packs/day: 0.50  . Years: 40.00  . Pack years: 20.00  . Types: Cigarettes  . Quit date: 08/25/2011  . Years since quitting: 7.3  Smokeless Tobacco Never Used    Social History   Substance and Sexual Activity  Alcohol Use Yes   Comment: rare    Family History  Problem Relation Age of Onset  . Heart disease Father        hx of MI  . Diabetes Mother        and brother   . Kidney failure Mother   . Breast cancer Sister   . Colon cancer Neg Hx   . Stomach cancer Neg Hx     Reviw of Systems:  Reviewed in the HPI.  All other systems are negative.    Physical Exam: Blood pressure 122/86, pulse 70, height 5\' 10"  (1.778 m), weight 191 lb (86.6 kg), SpO2 96 %.  GEN:  Well nourished, well developed in no acute distress HEENT: Normal NECK: No JVD; No carotid bruits LYMPHATICS: No lymphadenopathy CARDIAC: RRR , no murmurs, rubs, gallops,  Pacer looks stable RESPIRATORY:  Clear to auscultation without rales, wheezing or rhonchi  ABDOMEN: Soft, non-tender, non-distended MUSCULOSKELETAL:  No edema; No deformity  SKIN: Warm and dry NEUROLOGIC:  Alert and oriented x 3   ECG:    Assessment / Plan:    1. CAD  -   his angina seems to be fairly well controlled.  He is noticed that if he takes an extra  metoprolol about an hour before he exercises that his angina is much better controlled.  We will continue with that dosing scheme.  I will see him in 6 months.  We will get fasting lipids, liver enzymes, basic metabolic profile at that time.  2. Hyperlipidemia:   LDL is 83.   On rosuvastatin 20 . Fenofibrate will continue to watch his fat intake.  3.  Obstructive sleep apnea:       Managed by primary MD      Mertie Moores, MD  12/21/2018 3:51 PM    [facility] [address],  [place], [place]  [address] Pager [phone] Phone: [phone]; Fax: [phone]

## 2018-12-22 ENCOUNTER — Ambulatory Visit (INDEPENDENT_AMBULATORY_CARE_PROVIDER_SITE_OTHER): Payer: BC Managed Care – PPO | Admitting: *Deleted

## 2018-12-22 DIAGNOSIS — I441 Atrioventricular block, second degree: Secondary | ICD-10-CM | POA: Diagnosis not present

## 2018-12-23 ENCOUNTER — Telehealth: Payer: Self-pay | Admitting: Internal Medicine

## 2018-12-23 LAB — CUP PACEART REMOTE DEVICE CHECK
Battery Remaining Longevity: 106 mo
Battery Remaining Percentage: 95.5 %
Battery Voltage: 3.01 V
Brady Statistic AP VP Percent: 9.4 %
Brady Statistic AP VS Percent: 1 %
Brady Statistic AS VP Percent: 91 %
Brady Statistic AS VS Percent: 1 %
Brady Statistic RA Percent Paced: 9.2 %
Brady Statistic RV Percent Paced: 99 %
Date Time Interrogation Session: 20200904162416
Implantable Lead Implant Date: 20200302
Implantable Lead Implant Date: 20200302
Implantable Lead Location: 753859
Implantable Lead Location: 753860
Implantable Lead Model: 3830
Implantable Pulse Generator Implant Date: 20200302
Lead Channel Impedance Value: 530 Ohm
Lead Channel Impedance Value: 580 Ohm
Lead Channel Pacing Threshold Amplitude: 0.75 V
Lead Channel Pacing Threshold Amplitude: 0.75 V
Lead Channel Pacing Threshold Pulse Width: 0.5 ms
Lead Channel Pacing Threshold Pulse Width: 1 ms
Lead Channel Sensing Intrinsic Amplitude: 12 mV
Lead Channel Sensing Intrinsic Amplitude: 3.6 mV
Lead Channel Setting Pacing Amplitude: 2 V
Lead Channel Setting Pacing Amplitude: 2.5 V
Lead Channel Setting Pacing Pulse Width: 0.5 ms
Lead Channel Setting Sensing Sensitivity: 2 mV
Pulse Gen Model: 2272
Pulse Gen Serial Number: 9114136

## 2018-12-23 NOTE — Telephone Encounter (Signed)
Spoke w/ pt and helped him send remote transmission. Transmission received.

## 2018-12-23 NOTE — Telephone Encounter (Signed)
New Message     Patient calling to see if remote transmission went through since he received a call stating the transmission failed.

## 2018-12-27 ENCOUNTER — Other Ambulatory Visit: Payer: Self-pay | Admitting: Physician Assistant

## 2019-01-05 ENCOUNTER — Encounter: Payer: Self-pay | Admitting: Cardiology

## 2019-01-05 NOTE — Progress Notes (Signed)
Remote pacemaker transmission.   

## 2019-01-16 DIAGNOSIS — G4733 Obstructive sleep apnea (adult) (pediatric): Secondary | ICD-10-CM | POA: Diagnosis not present

## 2019-01-17 ENCOUNTER — Other Ambulatory Visit: Payer: Self-pay | Admitting: Family Medicine

## 2019-01-19 DIAGNOSIS — L570 Actinic keratosis: Secondary | ICD-10-CM | POA: Diagnosis not present

## 2019-01-19 DIAGNOSIS — X32XXXD Exposure to sunlight, subsequent encounter: Secondary | ICD-10-CM | POA: Diagnosis not present

## 2019-01-19 DIAGNOSIS — L728 Other follicular cysts of the skin and subcutaneous tissue: Secondary | ICD-10-CM | POA: Diagnosis not present

## 2019-01-26 ENCOUNTER — Encounter: Payer: Self-pay | Admitting: Family Medicine

## 2019-01-26 ENCOUNTER — Other Ambulatory Visit: Payer: Self-pay

## 2019-01-26 ENCOUNTER — Ambulatory Visit (INDEPENDENT_AMBULATORY_CARE_PROVIDER_SITE_OTHER): Payer: BC Managed Care – PPO

## 2019-01-26 DIAGNOSIS — Z23 Encounter for immunization: Secondary | ICD-10-CM

## 2019-03-23 ENCOUNTER — Telehealth: Payer: Self-pay | Admitting: Internal Medicine

## 2019-03-23 ENCOUNTER — Ambulatory Visit (INDEPENDENT_AMBULATORY_CARE_PROVIDER_SITE_OTHER): Payer: BC Managed Care – PPO | Admitting: *Deleted

## 2019-03-23 DIAGNOSIS — I459 Conduction disorder, unspecified: Secondary | ICD-10-CM

## 2019-03-23 LAB — CUP PACEART REMOTE DEVICE CHECK
Battery Remaining Longevity: 104 mo
Battery Remaining Percentage: 95.5 %
Battery Voltage: 3.01 V
Brady Statistic AP VP Percent: 12 %
Brady Statistic AP VS Percent: 1 %
Brady Statistic AS VP Percent: 88 %
Brady Statistic AS VS Percent: 1 %
Brady Statistic RA Percent Paced: 11 %
Brady Statistic RV Percent Paced: 99 %
Date Time Interrogation Session: 20201203100222
Implantable Lead Implant Date: 20200302
Implantable Lead Implant Date: 20200302
Implantable Lead Location: 753859
Implantable Lead Location: 753860
Implantable Lead Model: 3830
Implantable Pulse Generator Implant Date: 20200302
Lead Channel Impedance Value: 550 Ohm
Lead Channel Impedance Value: 580 Ohm
Lead Channel Pacing Threshold Amplitude: 0.75 V
Lead Channel Pacing Threshold Amplitude: 0.75 V
Lead Channel Pacing Threshold Pulse Width: 0.5 ms
Lead Channel Pacing Threshold Pulse Width: 1 ms
Lead Channel Sensing Intrinsic Amplitude: 12 mV
Lead Channel Sensing Intrinsic Amplitude: 3.3 mV
Lead Channel Setting Pacing Amplitude: 2 V
Lead Channel Setting Pacing Amplitude: 2.5 V
Lead Channel Setting Pacing Pulse Width: 0.5 ms
Lead Channel Setting Sensing Sensitivity: 2 mV
Pulse Gen Model: 2272
Pulse Gen Serial Number: 9114136

## 2019-03-23 NOTE — Telephone Encounter (Signed)
°  1. Has your device fired? no  2. Is you device beeping? no  3. Are you experiencing draining or swelling at device site? no  4. Are you calling to see if we received your device transmission? Patient is unsure if transmission was sent. He says the machine isn't doing anything and that this has happened before.  5. Have you passed out? no    Please route to [place]

## 2019-03-23 NOTE — Telephone Encounter (Signed)
I called the pt and helped him send a transmission with his home monitor. Transmission received

## 2019-03-29 ENCOUNTER — Other Ambulatory Visit: Payer: Self-pay | Admitting: Cardiovascular Disease

## 2019-03-29 ENCOUNTER — Other Ambulatory Visit: Payer: Self-pay | Admitting: Family Medicine

## 2019-03-29 MED ORDER — ISOSORBIDE MONONITRATE ER 30 MG PO TB24: 30.0000 mg | ORAL_TABLET | Freq: Every day | ORAL | 2 refills | Status: DC

## 2019-04-18 NOTE — Progress Notes (Signed)
PPM remote 

## 2019-05-01 ENCOUNTER — Telehealth: Payer: Self-pay | Admitting: Family Medicine

## 2019-05-01 ENCOUNTER — Ambulatory Visit (INDEPENDENT_AMBULATORY_CARE_PROVIDER_SITE_OTHER): Payer: BC Managed Care – PPO | Admitting: Family Medicine

## 2019-05-01 VITALS — BP 133/86

## 2019-05-01 DIAGNOSIS — I10 Essential (primary) hypertension: Secondary | ICD-10-CM | POA: Diagnosis not present

## 2019-05-01 DIAGNOSIS — R05 Cough: Secondary | ICD-10-CM

## 2019-05-01 DIAGNOSIS — J449 Chronic obstructive pulmonary disease, unspecified: Secondary | ICD-10-CM | POA: Diagnosis not present

## 2019-05-01 DIAGNOSIS — R059 Cough, unspecified: Secondary | ICD-10-CM

## 2019-05-01 MED ORDER — AZITHROMYCIN 250 MG PO TABS: ORAL_TABLET | ORAL | 0 refills | Status: DC

## 2019-05-01 NOTE — Assessment & Plan Note (Signed)
Acute flare due to URI.  Looks well on exam with no respiratory distress.  Will get tested for Covid.  Will treat with course of azithromycin.  Recommend he continue albuterol.

## 2019-05-01 NOTE — Progress Notes (Signed)
   Daniel Carey is a 69 y.o. male who presents today for a virtual office visit.  Assessment/Plan:  Chronic Problems Addressed Today: COPD mixed type ([place]) Acute flare due to URI.  Looks well on exam with no respiratory distress.  Will get tested for Covid.  Will treat with course of azithromycin.  Recommend he continue albuterol.  Essential hypertension Reported blood pressures at goal.  Continue Metroprolol tartrate 25 mg twice daily, HCTZ 12.5 mg daily, and Imdur 30 mg daily.     Subjective:  HPI:  Cough Symptoms started 3 days ago. Associated with fever, cough, chills, mild shortness of breath, and myalgias. Also with some headache. Tried tylenol which seemed to help. No other sck contacts.        Objective/Observations  Physical Exam: Gen: NAD, resting comfortably Pulm: Normal work of breathing Neuro: Grossly normal, moves all extremities Psych: Normal affect and thought content  Virtual Visit via Video   I connected with Daniel Carey on 05/01/19 at 11:20 AM EST by a video enabled telemedicine application and verified that I am speaking with the correct person using two identifiers. The limitations of evaluation and management by telemedicine and the availability of in person appointments were discussed. The patient expressed understanding and agreed to proceed.   Patient location: Home Provider location: [place] participating in the virtual visit: Myself and Patient     Algis Greenhouse. Jerline Pain, MD 05/01/2019 11:08 AM

## 2019-05-01 NOTE — Assessment & Plan Note (Signed)
Reported blood pressures at goal.  Continue Metroprolol tartrate 25 mg twice daily, HCTZ 12.5 mg daily, and Imdur 30 mg daily.

## 2019-05-01 NOTE — Telephone Encounter (Signed)
Patients calls in saying he has been experiencing some covid symptoms and wanted to know where some place are that dont charge, and he lives in [place] but also scheduled an appt to speak about how he is feeling lately.

## 2019-05-01 NOTE — Telephone Encounter (Signed)
Patient scheduled for appt.

## 2019-05-02 ENCOUNTER — Ambulatory Visit: Payer: BC Managed Care – PPO | Attending: Internal Medicine

## 2019-05-02 ENCOUNTER — Other Ambulatory Visit: Payer: Self-pay

## 2019-05-02 DIAGNOSIS — Z20822 Contact with and (suspected) exposure to covid-19: Secondary | ICD-10-CM | POA: Diagnosis not present

## 2019-05-03 LAB — NOVEL CORONAVIRUS, NAA: SARS-CoV-2, NAA: DETECTED — AB

## 2019-05-04 ENCOUNTER — Telehealth: Payer: Self-pay | Admitting: Nurse Practitioner

## 2019-05-04 NOTE — Telephone Encounter (Signed)
Called to Discuss with patient about Covid symptoms and the use of bamlanivimab, a monoclonal antibody infusion for those with mild to moderate Covid symptoms and at a high risk of hospitalization.     Pt is qualified for this infusion at the [facility] infusion center due to co-morbid conditions and/or a member of an at-risk group.     Patient Active Problem List   Diagnosis Date Noted  . COPD mixed type ([place]) 12/02/2018  . Spinal stenosis of lumbar region 05/12/2018  . Obstructive sleep apnea 04/26/2014  . Heart block 07/28/2011  . Diverticulosis of colon (without mention of hemorrhage) 07/08/2011  . History of colonic polyps 12/23/2010  . HIATAL HERNIA 12/22/2007  . Hyperlipidemia 12/30/2006  . Essential hypertension 12/30/2006  . CAD (coronary artery disease) 12/30/2006  . GERD 12/30/2006    Patient declines infusion at this time given that symptoms are improving. Symptoms tier reviewed as well as criteria for ending isolation. Preventative practices reviewed. Patient verbalized understanding.   Patient advised to call back if he decides that he does want to get infusion. Callback number to the infusion center given. Patient advised to go to Urgent care or ED with severe symptoms.

## 2019-05-09 ENCOUNTER — Encounter: Payer: Self-pay | Admitting: Family Medicine

## 2019-05-30 ENCOUNTER — Other Ambulatory Visit: Payer: Self-pay

## 2019-05-30 ENCOUNTER — Encounter (INDEPENDENT_AMBULATORY_CARE_PROVIDER_SITE_OTHER): Payer: Self-pay | Admitting: Otolaryngology

## 2019-05-30 ENCOUNTER — Ambulatory Visit (INDEPENDENT_AMBULATORY_CARE_PROVIDER_SITE_OTHER): Payer: BC Managed Care – PPO | Admitting: Otolaryngology

## 2019-05-30 VITALS — Temp 98.1°F

## 2019-05-30 DIAGNOSIS — J310 Chronic rhinitis: Secondary | ICD-10-CM | POA: Diagnosis not present

## 2019-05-30 DIAGNOSIS — Z23 Encounter for immunization: Secondary | ICD-10-CM | POA: Diagnosis not present

## 2019-05-30 NOTE — Progress Notes (Signed)
HPI: Daniel Carey is a 69 y.o. male who presents for evaluation of chronic runny nose.  Patient denies any nasal obstruction.  He has history of obstructive sleep apnea and uses CPAP.  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  He has had no fever and no signs of infection.  The only medication he has used so far is saline nasal rinse  Past Medical History:  Diagnosis Date  . 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 CAD (coronary artery disease)    moderate to severe distal CAD with large epicardial arteries having only mild to moderate irregularities. Last cath in Dec 2006; s/p cath with Promus DES in distal RCA and Promus DES in the mid vessel in an overlapping fashion  . Chest pain   . Complication of anesthesia    heart rate dropping with propofol last procedure  . COPD (chronic obstructive pulmonary disease) ([place])   . ED (erectile dysfunction)   . Esophageal stricture   . External hemorrhoids without mention of complication   . GERD (gastroesophageal reflux disease)   . Headache(784.0)   . Hiatal hernia   . History of kidney stones   . HLD (hyperlipidemia)   . HTN (hypertension)   . Myocardial infarction ([place])    1996  . Personal history of colonic polyps 03/12/2010   TUBULAR ADENOMA  . Tobacco abuse    Past Surgical History:  Procedure Laterality Date  . ANGIOPLASTY     X3  . COLONOSCOPY N/A 02/16/2017   Procedure: COLONOSCOPY;  Surgeon: Jerene Bears, MD;  [facility];  Service: Gastroenterology;  Laterality: N/A;  . INSERT / REPLACE / REMOVE PACEMAKER  06/20/2018  . LEFT HEART CATH AND CORONARY ANGIOGRAPHY N/A 11/05/2016   Procedure: Left Heart Cath and Coronary Angiography;  Surgeon: Nelva Bush, MD;  Location: [place] [facility];  Service: Cardiovascular;  Laterality: N/A;  . LUMBAR LAMINECTOMY/DECOMPRESSION MICRODISCECTOMY N/A 05/12/2018   Procedure: Microlumbar  decompression L2-3, possible L1-2;  Surgeon: Susa Day, MD;  Location: [place];  Service: Orthopedics;  Laterality: N/A;  2 hrs, Microlumbar decompression L2-3, possible L1-2  . PACEMAKER IMPLANT N/A 06/20/2018   Procedure: PACEMAKER IMPLANT;  Surgeon: Evans Lance, MD;  Location: [place] [facility];  Service: Cardiovascular;  Laterality: N/A;  . PERCUTANEOUS CORONARY ROTOBLATOR INTERVENTION (PCI-R) N/A 06/30/2012   Procedure: PERCUTANEOUS CORONARY ROTOBLATOR INTERVENTION (PCI-R);  Surgeon: Burnell Blanks, MD;  Location: [facility] CATH LAB;  Service: Cardiovascular;  Laterality: N/A;  . VIDEO BRONCHOSCOPY  08/13/2011   Procedure: VIDEO BRONCHOSCOPY WITHOUT FLUORO;  Surgeon: Tanda Rockers, MD;  [facility];  Service: Cardiopulmonary;  Laterality: Bilateral;   Social History   Socioeconomic History  . Marital status: Married    Spouse name: Not on file  . Number of children: 1  . Years of education: Not on file  . Highest education level: Not on file  Occupational History  . Occupation: Radio broadcast assistant  Tobacco Use  . Smoking status: Former Smoker    Packs/day: 0.50    Years: 40.00    Pack years: 20.00    Types: Cigarettes    Quit date: 08/25/2011    Years since quitting: 7.7  . Smokeless tobacco: Never Used  Substance and Sexual Activity  . Alcohol use: Yes    Comment: rare  . Drug use: No  . Sexual activity: Not  Currently  Other Topics Concern  . Not on file  Social History Narrative  . Not on file   Social Determinants of Health   Financial Resource Strain:   . Difficulty of Paying Living Expenses: Not on file  Food Insecurity:   . Worried About Charity fundraiser in the Last Year: Not on file  . Ran Out of Food in the Last Year: Not on file  Transportation Needs:   . Lack of Transportation (Medical): Not on file  . Lack of Transportation (Non-Medical): Not on file  Physical Activity:   . Days of Exercise per Week: Not on file  . Minutes of  Exercise per Session: Not on file  Stress:   . Feeling of Stress : Not on file  Social Connections:   . Frequency of Communication with Friends and Family: Not on file  . Frequency of Social Gatherings with Friends and Family: Not on file  . Attends Religious Services: Not on file  . Active Member of Clubs or Organizations: Not on file  . Attends Archivist Meetings: Not on file  . Marital Status: Not on file   Family History  Problem Relation Age of Onset  . Heart disease Father        hx of MI  . Diabetes Mother        and brother   . Kidney failure Mother   . Breast cancer Sister   . Colon cancer Neg Hx   . Stomach cancer Neg Hx    Allergies  Allergen Reactions  . Amlodipine Besylate Other (See Comments)    Headache, weakness, dizziness  . Propofol Other (See Comments)    Heart rate dropped    . Lisinopril Cough and Rash  . Rosuvastatin Rash and Other (See Comments)     muscle aches   . Metoprolol     Lowered heart rate too much    Prior to Admission medications   Medication Sig Start Date End Date Taking? Authorizing Provider  acetaminophen (TYLENOL) 500 MG tablet Take 1,000 mg by mouth every 6 (six) hours as needed (pain).    Yes [provider]  albuterol (VENTOLIN HFA) 108 (90 Base) MCG/ACT inhaler INHALE 2 PUFFS INTO THE LUNGS EVERY 6 HOURS AS NEEDED FOR WHEEZE OR SHORTNESS OF BREATH 03/29/19  Yes Vivi Barrack, MD  aspirin EC 81 MG tablet Take 1 tablet (81 mg total) by mouth every evening. Resume 4 days post-op 05/13/18  Yes Lacie Draft M, PA-C  azithromycin [facility]) 250 MG tablet Take 2 tabs day 1, then 1 tab daily 05/01/19  Yes Vivi Barrack, MD  Choline Fenofibrate (FENOFIBRIC ACID) 135 MG CPDR Take 1 tablet by mouth daily. 06/01/18  Yes Weaver, Scott T, PA-C  famotidine (PEPCID) 20 MG tablet Take 20 mg by mouth daily as needed for heartburn.   Yes [provider]  hydrochlorothiazide (HYDRODIURIL) 25 MG tablet Take 0.5  tablets (12.5 mg total) by mouth daily. 06/01/18  Yes Weaver, Scott T, PA-C  isosorbide mononitrate (IMDUR) 30 MG 24 hr tablet Take 1 tablet (30 mg total) by mouth daily. 03/29/19  Yes Nahser, Wonda Cheng, MD  metoprolol tartrate (LOPRESSOR) 25 MG tablet Take 1 tablet (25 mg total) by mouth 2 (two) times daily. Take addition one tablet 1 hr prior to exercise 12/21/18  Yes Nahser, Wonda Cheng, MD  nitroGLYCERIN (NITROSTAT) 0.4 MG SL tablet Place 1 tablet (0.4 mg total) under the tongue every 5 (five) minutes as  needed for chest pain. 06/01/18  Yes Weaver, Scott T, PA-C  polyethylene glycol (MIRALAX / GLYCOLAX) packet Take 17 g by mouth daily as needed. 05/13/18  Yes Lacie Draft M, PA-C  RABEprazole (ACIPHEX) 20 MG tablet Take 1 tablet (20 mg total) by mouth daily at 3 pm. 12/02/18  Yes Vivi Barrack, MD  rosuvastatin (CRESTOR) 20 MG tablet Take 1 tablet (20 mg total) by mouth daily. 06/01/18  Yes Weaver, Scott T, PA-C     Positive ROS: Otherwise negative  All other systems have been reviewed and were otherwise negative with the exception of those mentioned in the HPI and as above.  Physical Exam: Constitutional: Alert, well-appearing, no acute distress Ears: External ears without lesions or tenderness. Ear canals are clear bilaterally with intact, clear TMs.  Nasal: External nose without lesions. Septum with minimal deformity.  Mild rhinitis on exam today with clear mucus discharge.  Both middle meatus regions are clear. Clear nasal passages. Oral: Lips and gums without lesions. Tongue and palate mucosa without lesions. Posterior oropharynx clear. Neck: No palpable adenopathy or masses Respiratory: Breathing comfortably  Skin: No facial/neck lesions or rash noted.  Procedures  Assessment: Chronic rhinitis  Plan: Reviewed with patient concerning options of treatment.  I will continue with the saline rinse as needed. Initially I would try antihistamines such as Claritin or Allegra or Zyrtec.   Apparently Benadryl makes him very tired. Next I would use Nasacort 2 sprays at night and or azelastine 0.15% 1 to 2 sprays twice daily. If these are not effective would lastly use Atrovent 0.06% 2 sprays in the morning. He will follow-up as needed.  Radene Journey, MD

## 2019-06-21 ENCOUNTER — Other Ambulatory Visit: Payer: Self-pay | Admitting: Physician Assistant

## 2019-06-22 ENCOUNTER — Other Ambulatory Visit: Payer: Self-pay

## 2019-06-22 ENCOUNTER — Ambulatory Visit (INDEPENDENT_AMBULATORY_CARE_PROVIDER_SITE_OTHER): Payer: BC Managed Care – PPO | Admitting: Internal Medicine

## 2019-06-22 ENCOUNTER — Encounter: Payer: Self-pay | Admitting: Cardiovascular Disease

## 2019-06-22 ENCOUNTER — Ambulatory Visit (INDEPENDENT_AMBULATORY_CARE_PROVIDER_SITE_OTHER): Payer: BC Managed Care – PPO | Admitting: Cardiovascular Disease

## 2019-06-22 ENCOUNTER — Ambulatory Visit (INDEPENDENT_AMBULATORY_CARE_PROVIDER_SITE_OTHER): Payer: BC Managed Care – PPO | Admitting: *Deleted

## 2019-06-22 VITALS — BP 158/82 | HR 69 | Ht 70.0 in | Wt 197.8 lb

## 2019-06-22 DIAGNOSIS — I459 Conduction disorder, unspecified: Secondary | ICD-10-CM

## 2019-06-22 DIAGNOSIS — I10 Essential (primary) hypertension: Secondary | ICD-10-CM | POA: Diagnosis not present

## 2019-06-22 DIAGNOSIS — E782 Mixed hyperlipidemia: Secondary | ICD-10-CM | POA: Diagnosis not present

## 2019-06-22 DIAGNOSIS — I25119 Atherosclerotic heart disease of native coronary artery with unspecified angina pectoris: Secondary | ICD-10-CM

## 2019-06-22 DIAGNOSIS — Z950 Presence of cardiac pacemaker: Secondary | ICD-10-CM | POA: Insufficient documentation

## 2019-06-22 LAB — CUP PACEART REMOTE DEVICE CHECK
Battery Remaining Longevity: 112 mo
Battery Remaining Percentage: 95.5 %
Battery Voltage: 3.01 V
Brady Statistic AP VP Percent: 12 %
Brady Statistic AP VS Percent: 1 %
Brady Statistic AS VP Percent: 88 %
Brady Statistic AS VS Percent: 1 %
Brady Statistic RA Percent Paced: 12 %
Brady Statistic RV Percent Paced: 99 %
Date Time Interrogation Session: 20210304020012
Implantable Lead Implant Date: 20200302
Implantable Lead Implant Date: 20200302
Implantable Lead Location: 753859
Implantable Lead Location: 753860
Implantable Lead Model: 3830
Implantable Pulse Generator Implant Date: 20200302
Lead Channel Impedance Value: 540 Ohm
Lead Channel Impedance Value: 630 Ohm
Lead Channel Pacing Threshold Amplitude: 0.75 V
Lead Channel Pacing Threshold Amplitude: 0.75 V
Lead Channel Pacing Threshold Pulse Width: 0.5 ms
Lead Channel Pacing Threshold Pulse Width: 1 ms
Lead Channel Sensing Intrinsic Amplitude: 2.8 mV
Lead Channel Sensing Intrinsic Amplitude: 9 mV
Lead Channel Setting Pacing Amplitude: 2 V
Lead Channel Setting Pacing Amplitude: 2.5 V
Lead Channel Setting Pacing Pulse Width: 0.5 ms
Lead Channel Setting Sensing Sensitivity: 2 mV
Pulse Gen Model: 2272
Pulse Gen Serial Number: 9114136

## 2019-06-22 MED ORDER — ISOSORBIDE MONONITRATE ER 60 MG PO TB24: 60.0000 mg | ORAL_TABLET | Freq: Every day | ORAL | 3 refills | Status: DC

## 2019-06-22 NOTE — Patient Instructions (Signed)
Medication Instructions:  Your physician recommends that you continue on your current medications as directed. Please refer to the Current Medication list given to you today.  Labwork: None ordered.  Testing/Procedures: None ordered.  Follow-Up: Your physician wants you to follow-up in: one year with Dr. Lovena Le.   You will receive a reminder letter in the mail two months in advance. If you don't receive a letter, please call our office to schedule the follow-up appointment.  Remote monitoring is used to monitor your Pacemaker from home. This monitoring reduces the number of office visits required to check your device to one time per year. It allows [place] to keep an eye on the functioning of your device to ensure it is working properly. You are scheduled for a device check from home on 09/21/2019. You may send your transmission at any time that day. If you have a wireless device, the transmission will be sent automatically. After your physician reviews your transmission, you will receive a postcard with your next transmission date.  Any Other Special Instructions Will Be Listed Below (If Applicable).  If you need a refill on your cardiac medications before your next appointment, please call your pharmacy.

## 2019-06-22 NOTE — Progress Notes (Signed)
[name] Date of Birth  1951/04/06       [facility] [address] [place], [place], [place]  [address]   [place], [place]  [address] [phone]     [phone]   Fax  [phone]    Fax [phone]  Problem List: 1. CAD - 2.5 x 38 mm Promus Premier DES in the distal RCA and a 2.75 x 38 mm Promus Premier DES in the mid vessel in an overlapping fashion. The distal stent was post-dilated with a 2.5 x 20 mm [place] balloon. The proximal portion of the stent was post-dilated with a 3.0 x 20 mm [place] balloon ( June 30, 2012)  2. Bradycardia after getting propafol for endoscopy. 3. Obstructive sleep apnea:   :  Drexel is feeling well today.  He's been getting some exercise. He does yard work and walks on the treadmill occasionally. He had an episode of bradycardia after getting IV protocol for an endoscopy procedure. He was seen by [name]. His metoprolol dose was decreased. He has also had a bronchoscopy with [name]. He does not have any evidence of COPD.  Both he and his wife have stop smoking.  ( Aug 20, 2011)  Feb. 28, 2014:  He continues to have increasing chest pain.  He has not been smoking.  The pains are associated with exertion, last 5-10 minutes, resolve with 1 SL NTG.   He's had a mildly elevated TSH for the past year or so and has had some fatigue.    his TSH level 6.8.      His medical doctor started him on Synthroid. He gradually has titrated up to 0.1 mg a day. Since that time he's had increasing episodes of chest pain.  Aug 29, 2012:  [name] saw him in February we performed a cardiac catheterization because of increasing chest pain. He was found to have sequential stenosis in his mid right coronary artery and had PCI of his right coronary artery ( 2.5 x 38 mm Promus Premier DES in the distal RCA and a 2.75 x 38 mm Promus Premier DES in the mid vessel in an overlapping fashion. The distal sten was post-dilated  with a 2.5 x 20 mm [place] balloon. The proximal portion of the stent was post-dilated with a 3.0 x 20 mm [place] balloon)  He and his wife are concerned about whether or not the Plavix is active.  His generic plavix manufacturer has changed.   He would like to be tested.   October 14, 2012:  Thadius has been having some occasional CP.  He has had some episodes of dyspnea while climbing up a hill playing golf.  This past Monday night, he had some chest pain - possible due to indigestions.  these  He had lost his NTG - he has refilled it now.    Nov. 10, 2014:  Markese is doing well.  He is working too much according to wife. he's had a few CP but not severe enough to take NTG.  Still has some dyspnea with climbing steps.   He had stenting in march 2014.  October 23, 2013:  Orvin is doing well.   Playing golf.  Working in the yard.  Not exercising as much.   He has lost about 6 lbs.  He had labs drawn today.     Jan. 7, 2016:  He has been waking up in the middle  of the night and feels like he cant catch his  Breath. Still working out - on the treadmill twice this week and has done well.   Sept. 1, 2016:  Has had some short of breath. Does not get any exercise.  His CP is much better since starting CPAP .  Is having bloating  - thinks its due to the lipitor   July 19, 2015: Has had some shortness of breath.   With exertion . Happens occasionally but seem to be getting more frequently .  BP is elevated today  Has cut back on his salt .   Sept. 21, 2017:  Vedad is seen today for follow up visit for his CAD Occasionally short of breath . Comes and goes .  BP is a little elevated today Has occasional CP  Able to do all that he wants to do   November 04, 2016: Still having some chest pain Not exertional ,  Seems very similar to his previous episodes of angina prior to stenting .   July 31 , 2108  Nand is seen today for follow up of a recent cath.  He was found to have diffuse moderate -  severe CAD - especially in the LAD  Was started on Imdur - is having a headache with that. Angina seems to be slightly  better.  He still has marked DOE - wife is very concerned about this .   is going to see pulmonary soon .  Sept. 24, 2018:  [name] is doing very well. His chest pain seemed to be better. Getting some exercise   A little bit more salt than usual over the weekend. His diastolic blood pressure today is 90.  Nov. 8, 2018:    Kshaun is seen today  Has worsening dyspnea.   Chest tightness Also had a bladder infection  - burning with urination. ,   Fevers, chills.   Temp of 99.8 Still having fevers and chills  His dyspnea seems to be worse with his chills.  No blood in urine,   extremy dysuria and frequency + cough , no hemoptysis, No pleuretic CP  No real angina   April 08, 2017:  Lorenda Hatchet is seen back for follow up visit Has urosepsis when I last saw him ( UTI with bacteremia )  Treated with ABx Now is having dysuria .   Is seeing urologist tomorrow .   No serious angina .   December 23, 2017:  [name] is seen today for follow-up of his coronary artery disease. Had a colonoscopy  Complicated by a E.coli UTI when I last saw him  Rare episodes of CP   Sept. 2, 2020   Doing well Had a pacer placed a month ago  Feels beter Has some CP if he starts walks on the treadmill.   Is able to walk through it and usually does ok  Lasts for a minutes.  Last cath was July 2019.  Conclusions: 1. Multivessel coronary artery disease, including diffuse calcified LAD disease of up to 60-70% (FFR 0.73), moderate to severe LCx and OM3 disease (FFR 0.83 at submaximal hyperemia), and moderate in-stent restenosis of mid RCA. 2. Normal left ventricular filling pressure. 3. Normal left ventricular systolic function.  Recommendations: 1. Escalate antianginal therapy with addition of isosorbide mononitrate. 2. Continue aggressive secondary prevention, including dual antiplatelet and  statin therapy. 3. I will review the films with the intervention and cardiac surgery teams. Revascularization options include atherectomy and PCI to LAD (would  require multiple stents) versus CABG.  He has found that if he takes an extra metoprolol about an hour before he exercises that his angina is much better controlled.  We will have him continue that dosing scheme.  June 22, 2019:  Occasional CP ,  Has some chest pressure while on the treadmill.   Rests for 5-10 min then the discomfort resolves Has not improved with takeing metoprolol before his workout.  He also notes he is having more shortness of breath.  Has cut his salt intake .  BP is elevated today .   Has not been elevated at home   His last heart catheterization was in 2018.  He was found to have diffuse coronary artery disease.  We increased medical therapy.  Tested + for Covid in January.   May still be recovering from that   Current Outpatient Medications on File Prior to Visit  Medication Sig Dispense Refill  . acetaminophen (TYLENOL) 500 MG tablet Take 1,000 mg by mouth every 6 (six) hours as needed (pain).     Marland Kitchen albuterol (VENTOLIN HFA) 108 (90 Base) MCG/ACT inhaler INHALE 2 PUFFS INTO THE LUNGS EVERY 6 HOURS AS NEEDED FOR WHEEZE OR SHORTNESS OF BREATH 8 g 3  . aspirin EC 81 MG tablet Take 1 tablet (81 mg total) by mouth every evening. Resume 4 days post-op    . azithromycin (ZITHROMAX) 250 MG tablet Take 2 tabs day 1, then 1 tab daily 6 each 0  . Choline Fenofibrate (FENOFIBRIC ACID) 135 MG CPDR TAKE 1 CAPSULE BY MOUTH EVERY DAY 30 capsule 5  . famotidine (PEPCID) 20 MG tablet Take 20 mg by mouth daily as needed for heartburn.    . hydrochlorothiazide (HYDRODIURIL) 25 MG tablet TAKE 1/2 TABLET BY MOUTH EVERY DAY 15 tablet 5  . isosorbide mononitrate (IMDUR) 30 MG 24 hr tablet Take 1 tablet (30 mg total) by mouth daily. 90 tablet 2  . metoprolol tartrate (LOPRESSOR) 25 MG tablet Take 1 tablet (25 mg total) by mouth 2  (two) times daily. Take addition one tablet 1 hr prior to exercise 180 tablet 3  . nitroGLYCERIN (NITROSTAT) 0.4 MG SL tablet Place 1 tablet (0.4 mg total) under the tongue every 5 (five) minutes as needed for chest pain. 25 tablet 4  . polyethylene glycol (MIRALAX / GLYCOLAX) packet Take 17 g by mouth daily as needed. 14 each 0  . RABEprazole (ACIPHEX) 20 MG tablet Take 1 tablet (20 mg total) by mouth daily at 3 pm. 30 tablet 11  . rosuvastatin (CRESTOR) 20 MG tablet TAKE 1 TABLET BY MOUTH EVERY DAY 30 tablet 5   No current facility-administered medications on file prior to visit.    Allergies  Allergen Reactions  . Amlodipine Besylate Other (See Comments)    Headache, weakness, dizziness  . Propofol Other (See Comments)    Heart rate dropped    . Lisinopril Cough and Rash  . Rosuvastatin Rash and Other (See Comments)     muscle aches   . Metoprolol     Lowered heart rate too much     Past Medical History:  Diagnosis Date  . 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 CAD (coronary artery disease)    moderate to severe distal CAD with large epicardial arteries having only mild to moderate irregularities. Last cath in Dec 2006; s/p cath with Promus DES in distal RCA and Promus DES in the mid vessel in an overlapping  fashion  . Chest pain   . Complication of anesthesia    heart rate dropping with propofol last procedure  . COPD (chronic obstructive pulmonary disease) ([place])   . ED (erectile dysfunction)   . Esophageal stricture   . External hemorrhoids without mention of complication   . GERD (gastroesophageal reflux disease)   . Headache(784.0)   . Hiatal hernia   . History of kidney stones   . HLD (hyperlipidemia)   . HTN (hypertension)   . Myocardial infarction ([place])    1996  . Personal history of colonic polyps 03/12/2010   TUBULAR ADENOMA  . Tobacco abuse     Past Surgical History:  Procedure Laterality Date  . ANGIOPLASTY     X3  . COLONOSCOPY N/A  02/16/2017   Procedure: COLONOSCOPY;  Surgeon: Jerene Bears, MD;  [facility];  Service: Gastroenterology;  Laterality: N/A;  . INSERT / REPLACE / REMOVE PACEMAKER  06/20/2018  . LEFT HEART CATH AND CORONARY ANGIOGRAPHY N/A 11/05/2016   Procedure: Left Heart Cath and Coronary Angiography;  Surgeon: Nelva Bush, MD;  Location: [place] [facility];  Service: Cardiovascular;  Laterality: N/A;  . LUMBAR LAMINECTOMY/DECOMPRESSION MICRODISCECTOMY N/A 05/12/2018   Procedure: Microlumbar decompression L2-3, possible L1-2;  Surgeon: Susa Day, MD;  Location: [facility];  Service: Orthopedics;  Laterality: N/A;  2 hrs, Microlumbar decompression L2-3, possible L1-2  . PACEMAKER IMPLANT N/A 06/20/2018   Procedure: PACEMAKER IMPLANT;  Surgeon: Evans Lance, MD;  Location: [place] [facility];  Service: Cardiovascular;  Laterality: N/A;  . PERCUTANEOUS CORONARY ROTOBLATOR INTERVENTION (PCI-R) N/A 06/30/2012   Procedure: PERCUTANEOUS CORONARY ROTOBLATOR INTERVENTION (PCI-R);  Surgeon: Burnell Blanks, MD;  Location: [facility] CATH LAB;  Service: Cardiovascular;  Laterality: N/A;  . VIDEO BRONCHOSCOPY  08/13/2011   Procedure: VIDEO BRONCHOSCOPY WITHOUT FLUORO;  Surgeon: Tanda Rockers, MD;  [facility];  Service: Cardiopulmonary;  Laterality: Bilateral;    Social History   Tobacco Use  Smoking Status Former Smoker  . Packs/day: 0.50  . Years: 40.00  . Pack years: 20.00  . Types: Cigarettes  . Quit date: 08/25/2011  . Years since quitting: 7.8  Smokeless Tobacco Never Used    Social History   Substance and Sexual Activity  Alcohol Use Yes   Comment: rare    Family History  Problem Relation Age of Onset  . Heart disease Father        hx of MI  . Diabetes Mother        and brother   . Kidney failure Mother   . Breast cancer Sister   . Colon cancer Neg Hx   . Stomach cancer Neg Hx     Reviw of Systems:  Reviewed in the HPI.  All other systems are negative.  Physical  Exam: Blood pressure (!) 158/82, pulse 69, height 5\' 10"  (1.778 m), weight 197 lb 12.8 oz (89.7 kg), SpO2 97 %.  GEN:  Well nourished, well developed in no acute distress HEENT: Normal NECK: No JVD; No carotid bruits LYMPHATICS: No lymphadenopathy CARDIAC: RRR , no murmurs, rubs, gallops RESPIRATORY:  Clear to auscultation without rales, wheezing or rhonchi  ABDOMEN: Soft, non-tender, non-distended MUSCULOSKELETAL:  No edema; No deformity  SKIN: Warm and dry NEUROLOGIC:  Alert and oriented x 3   ECG:    Assessment / Plan:   1. COVID :   In January.  Slowly improving /some of his symptoms of increasing shortness of breath may be just due from  healing up from Covid.  2. CAD  -   Having some angina .   Increase imdur to 60  A day .   I will see him again in 6 months.  If he is continuing to have worsening angina we may need to consider heart catheterization.  His last heart catheterization in 2018 suggested that his neck step would be a fairly complex rotational atherectomy with multiple stents or bypass surgery.  He is not ready to consider those at this time.  3. Hyperlipidemia:   We will check labs today.  4.  Obstructive sleep apnea:        Mertie Moores, MD  06/22/2019 3:31 PM    [facility] [place],  [place], [place]  [phone] Pager [phone] Phone: [phone]; Fax: [phone]

## 2019-06-22 NOTE — Progress Notes (Signed)
HPI Daniel Carey returns today for followup of CHB, s/p PPM insertion, HTN, and CAD. He has had mild exertional chest pressure. He has had Covid 19 and been vaccinated. His sense of taste and smell are just coming back.  Allergies  Allergen Reactions  . Amlodipine Besylate Other (See Comments)    Headache, weakness, dizziness  . Propofol Other (See Comments)    Heart rate dropped    . Lisinopril Cough and Rash  . Rosuvastatin Rash and Other (See Comments)     muscle aches   . Metoprolol     Lowered heart rate too much      Current Outpatient Medications  Medication Sig Dispense Refill  . acetaminophen (TYLENOL) 500 MG tablet Take 1,000 mg by mouth every 6 (six) hours as needed (pain).     Marland Kitchen albuterol (VENTOLIN HFA) 108 (90 Base) MCG/ACT inhaler INHALE 2 PUFFS INTO THE LUNGS EVERY 6 HOURS AS NEEDED FOR WHEEZE OR SHORTNESS OF BREATH 8 g 3  . aspirin EC 81 MG tablet Take 1 tablet (81 mg total) by mouth every evening. Resume 4 days post-op    . azithromycin (ZITHROMAX) 250 MG tablet Take 2 tabs day 1, then 1 tab daily 6 each 0  . Choline Fenofibrate (FENOFIBRIC ACID) 135 MG CPDR TAKE 1 CAPSULE BY MOUTH EVERY DAY 30 capsule 5  . famotidine (PEPCID) 20 MG tablet Take 20 mg by mouth daily as needed for heartburn.    . hydrochlorothiazide (HYDRODIURIL) 25 MG tablet TAKE 1/2 TABLET BY MOUTH EVERY DAY 15 tablet 5  . isosorbide mononitrate (IMDUR) 60 MG 24 hr tablet Take 1 tablet (60 mg total) by mouth daily. 90 tablet 3  . metoprolol tartrate (LOPRESSOR) 25 MG tablet Take 1 tablet (25 mg total) by mouth 2 (two) times daily. Take addition one tablet 1 hr prior to exercise 180 tablet 3  . nitroGLYCERIN (NITROSTAT) 0.4 MG SL tablet Place 1 tablet (0.4 mg total) under the tongue every 5 (five) minutes as needed for chest pain. 25 tablet 4  . polyethylene glycol (MIRALAX / GLYCOLAX) packet Take 17 g by mouth daily as needed. 14 each 0  . RABEprazole (ACIPHEX) 20 MG tablet Take 1 tablet (20  mg total) by mouth daily at 3 pm. 30 tablet 11  . rosuvastatin (CRESTOR) 20 MG tablet TAKE 1 TABLET BY MOUTH EVERY DAY 30 tablet 5   No current facility-administered medications for this visit.     Past Medical History:  Diagnosis Date  . 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 CAD (coronary artery disease)    moderate to severe distal CAD with large epicardial arteries having only mild to moderate irregularities. Last cath in Dec 2006; s/p cath with Promus DES in distal RCA and Promus DES in the mid vessel in an overlapping fashion  . Chest pain   . Complication of anesthesia    heart rate dropping with propofol last procedure  . COPD (chronic obstructive pulmonary disease) ([place])   . ED (erectile dysfunction)   . Esophageal stricture   . External hemorrhoids without mention of complication   . GERD (gastroesophageal reflux disease)   . Headache(784.0)   . Hiatal hernia   . History of kidney stones   . HLD (hyperlipidemia)   . HTN (hypertension)   . Myocardial infarction ([place])    1996  . Personal history of colonic polyps 03/12/2010   TUBULAR ADENOMA  . Tobacco  abuse     ROS:   All systems reviewed and negative except as noted in the HPI.   Past Surgical History:  Procedure Laterality Date  . ANGIOPLASTY     X3  . COLONOSCOPY N/A 02/16/2017   Procedure: COLONOSCOPY;  Surgeon: Jerene Bears, MD;  Location: [facility];  Service: Gastroenterology;  Laterality: N/A;  . INSERT / REPLACE / REMOVE PACEMAKER  06/20/2018  . LEFT HEART CATH AND CORONARY ANGIOGRAPHY N/A 11/05/2016   Procedure: Left Heart Cath and Coronary Angiography;  Surgeon: Nelva Bush, MD;  Location: [facility];  Service: Cardiovascular;  Laterality: N/A;  . LUMBAR LAMINECTOMY/DECOMPRESSION MICRODISCECTOMY N/A 05/12/2018   Procedure: Microlumbar decompression L2-3, possible L1-2;  Surgeon: Susa Day, MD;  Location: [facility];  Service: Orthopedics;  Laterality: N/A;  2 hrs, Microlumbar  decompression L2-3, possible L1-2  . PACEMAKER IMPLANT N/A 06/20/2018   Procedure: PACEMAKER IMPLANT;  Surgeon: Evans Lance, MD;  Location: [facility];  Service: Cardiovascular;  Laterality: N/A;  . PERCUTANEOUS CORONARY ROTOBLATOR INTERVENTION (PCI-R) N/A 06/30/2012   Procedure: PERCUTANEOUS CORONARY ROTOBLATOR INTERVENTION (PCI-R);  Surgeon: Burnell Blanks, MD;  Location: [facility];  Service: Cardiovascular;  Laterality: N/A;  . VIDEO BRONCHOSCOPY  08/13/2011   Procedure: VIDEO BRONCHOSCOPY WITHOUT FLUORO;  Surgeon: Tanda Rockers, MD;  Location: [facility];  Service: Cardiopulmonary;  Laterality: Bilateral;     Family History  Problem Relation Age of Onset  . Heart disease Father        hx of MI  . Diabetes Mother        and brother   . Kidney failure Mother   . Breast cancer Sister   . Colon cancer Neg Hx   . Stomach cancer Neg Hx      Social History   Socioeconomic History  . Marital status: Married    Spouse name: Not on file  . Number of children: 1  . Years of education: Not on file  . Highest education level: Not on file  Occupational History  . Occupation: Radio broadcast assistant  Tobacco Use  . Smoking status: Former Smoker    Packs/day: 0.50    Years: 40.00    Pack years: 20.00    Types: Cigarettes    Quit date: 08/25/2011    Years since quitting: 7.8  . Smokeless tobacco: Never Used  Substance and Sexual Activity  . Alcohol use: Yes    Comment: rare  . Drug use: No  . Sexual activity: Not Currently  Other Topics Concern  . Not on file  Social History Narrative  . Not on file   Social Determinants of Health   Financial Resource Strain:   . Difficulty of Paying Living Expenses: Not on file  Food Insecurity:   . Worried About Charity fundraiser in the Last Year: Not on file  . Ran Out of Food in the Last Year: Not on file  Transportation Needs:   . Lack of Transportation (Medical): Not on file  . Lack of Transportation  (Non-Medical): Not on file  Physical Activity:   . Days of Exercise per Week: Not on file  . Minutes of Exercise per Session: Not on file  Stress:   . Feeling of Stress : Not on file  Social Connections:   . Frequency of Communication with Friends and Family: Not on file  . Frequency of Social Gatherings with Friends and Family: Not on file  . Attends Religious Services: Not  on file  . Active Member of Clubs or Organizations: Not on file  . Attends Archivist Meetings: Not on file  . Marital Status: Not on file  Intimate Partner Violence:   . Fear of Current or Ex-Partner: Not on file  . Emotionally Abused: Not on file  . Physically Abused: Not on file  . Sexually Abused: Not on file     BP 158/82, P - 69, R - 18 Physical Exam:  Well appearing NAD HEENT: Unremarkable Neck:  6 cm JVD, no thyromegally Lymphatics:  No adenopathy Back:  No CVA tenderness Lungs:  Clear with no wheezes HEART:  Regular rate rhythm, no murmurs, no rubs, no clicks Abd:  soft, positive bowel sounds, no organomegally, no rebound, no guarding Ext:  2 plus pulses, no edema, no cyanosis, no clubbing Skin:  No rashes no nodules Neuro:  CN II through XII intact, motor grossly intact  DEVICE  Normal device function.  See PaceArt for details.   Assess/Plan: 1. cHB - he is asymptomatic, s/p PPM insertion.  2. CAD - he has had some chest pain and Dr. Acie Fredrickson has uptitrated his imdur.  3. HTN - His bp is up a bit and I have asked him to continue his current meds, uptitrate his imdur and avoid salty foods. 4. PPM - his [employer] Jude DDD PM is working normally. We will recheck in several months.  Mikle Bosworth.D.

## 2019-06-22 NOTE — Patient Instructions (Signed)
Medication Instructions:  Your physician has recommended you make the following change in your medication:  INCREASE Imdur (Isosorbide mononitrate) to 60 mg once daily  *If you need a refill on your cardiac medications before your next appointment, please call your pharmacy*   Lab Work: TODAY - cholesterol, liver panel, basic metabolic panel If you have labs (blood work) drawn today and your tests are completely normal, you will receive your results only by: Marland Kitchen MyChart Message (if you have MyChart) OR . A paper copy in the mail If you have any lab test that is abnormal or we need to change your treatment, we will call you to review the results.   Testing/Procedures: None Ordered   Follow-Up: At [facility], you and your health needs are our priority.  As part of our continuing mission to provide you with exceptional heart care, we have created designated Provider Care Teams.  These Care Teams include your primary Cardiologist (physician) and Advanced Practice Providers (APPs -  Physician Assistants and Nurse Practitioners) who all work together to provide you with the care you need, when you need it.     Your next appointment:   6 month(s)  The format for your next appointment:   In Person  Provider:   You may see Mertie Moores, MD or one of the following Advanced Practice Providers on your designated Care Team:    Richardson Dopp, PA-C  [place], [place]  Daune Perch, [place]

## 2019-06-23 ENCOUNTER — Other Ambulatory Visit: Payer: Self-pay | Admitting: *Deleted

## 2019-06-23 DIAGNOSIS — E782 Mixed hyperlipidemia: Secondary | ICD-10-CM

## 2019-06-23 LAB — CUP PACEART INCLINIC DEVICE CHECK
Battery Remaining Longevity: 114 mo
Battery Voltage: 3.01 V
Brady Statistic RA Percent Paced: 12 %
Brady Statistic RV Percent Paced: 99.84 %
Date Time Interrogation Session: 20210304160200
Implantable Lead Implant Date: 20200302
Implantable Lead Implant Date: 20200302
Implantable Lead Location: 753859
Implantable Lead Location: 753860
Implantable Lead Model: 3830
Implantable Pulse Generator Implant Date: 20200302
Lead Channel Impedance Value: 562.5 Ohm
Lead Channel Impedance Value: 625 Ohm
Lead Channel Pacing Threshold Amplitude: 0.75 V
Lead Channel Pacing Threshold Amplitude: 1 V
Lead Channel Pacing Threshold Pulse Width: 0.5 ms
Lead Channel Pacing Threshold Pulse Width: 0.5 ms
Lead Channel Sensing Intrinsic Amplitude: 2.9 mV
Lead Channel Sensing Intrinsic Amplitude: >12
Lead Channel Setting Pacing Amplitude: 2 V
Lead Channel Setting Pacing Amplitude: 2.5 V
Lead Channel Setting Pacing Pulse Width: 0.5 ms
Lead Channel Setting Sensing Sensitivity: 2 mV
Pulse Gen Model: 2272
Pulse Gen Serial Number: 9114136

## 2019-06-23 LAB — BASIC METABOLIC PANEL
BUN/Creatinine Ratio: 13 (ref 10–24)
BUN: 15 mg/dL (ref 8–27)
CO2: 26 mmol/L (ref 20–29)
Calcium: 9.8 mg/dL (ref 8.6–10.2)
Chloride: 101 mmol/L (ref 96–106)
Creatinine, Ser: 1.18 mg/dL (ref 0.76–1.27)
GFR calc Af Amer: 73 mL/min/{1.73_m2} (ref >59)
GFR calc non Af Amer: 63 mL/min/{1.73_m2} (ref >59)
Glucose: 98 mg/dL (ref 65–99)
Potassium: 5.1 mmol/L (ref 3.5–5.2)
Sodium: 139 mmol/L (ref 134–144)

## 2019-06-23 LAB — LIPID PANEL
Chol/HDL Ratio: 3 ratio (ref 0.0–5.0)
Cholesterol, Total: 156 mg/dL (ref 100–199)
HDL: 52 mg/dL (ref >39)
LDL Chol Calc (NIH): 85 mg/dL (ref 0–99)
Triglycerides: 104 mg/dL (ref 0–149)
VLDL Cholesterol Cal: 19 mg/dL (ref 5–40)

## 2019-06-23 LAB — HEPATIC FUNCTION PANEL
ALT: 17 IU/L (ref 0–44)
AST: 17 IU/L (ref 0–40)
Albumin: 4.1 g/dL (ref 3.8–4.8)
Alkaline Phosphatase: 68 IU/L (ref 39–117)
Bilirubin Total: 0.3 mg/dL (ref 0.0–1.2)
Bilirubin, Direct: 0.15 mg/dL (ref 0.00–0.40)
Total Protein: 6.6 g/dL (ref 6.0–8.5)

## 2019-06-23 NOTE — Progress Notes (Signed)
PPM Remote  

## 2019-06-26 ENCOUNTER — Ambulatory Visit (INDEPENDENT_AMBULATORY_CARE_PROVIDER_SITE_OTHER): Payer: BC Managed Care – PPO | Admitting: Pharmacist

## 2019-06-26 ENCOUNTER — Other Ambulatory Visit: Payer: Self-pay

## 2019-06-26 DIAGNOSIS — E785 Hyperlipidemia, unspecified: Secondary | ICD-10-CM | POA: Diagnosis not present

## 2019-06-26 NOTE — Progress Notes (Signed)
Patient ID: Daniel Carey                 DOB: 07-Feb-1951                    MRN: [id number]     HPI: Daniel Carey is a 69 y.o. male patient referred to lipid clinic by Dr. Acie Fredrickson. PMH is significant for CAD s/p MI in 1996 and PCI to RCA, HTN, CHB s/p PPM insertion, and OSA. His last cath in 2018 showed multivessel disease with diffuse calcified LAD disease up to 60-70%, moderate-severe LCx and OM3 disease, and moderate in-stent restenosis of mid RCA. He was last seen by Dr. Acie Fredrickson on 06/22/19 where he had occasional CP while on the treadmill that resolves upon resting for 5-10 minutes. His Imdur was increased with plans for possible atherectomy with multiple stents or bypass surgery if angina continues to worsen.  Patient arrives today for initial visit. He has not had any chest pain since increasing his Imdur but has noticed a tolerable, but new persistent headache. He states that this happened before when he increased his Imdur from 15 to 30mg  and that it went away when his body got used to the dose change. He has been taking rosuvastatin and fenofibrate for many years and has tolerated them well. He has occasional muscle pain but states it is tolerable. He exercises regularly and tries to stick to a diet low in saturated foods. He does not eat fast food often unless he is traveling, which he has not done recently. He is interested in learning more about newer lipid-lowering agents but would like to do his own research before starting any new medications. He currently has [employer] prescription insurance but may be changing to Medicare in the upcoming months.  Current Medications: Rosuvastatin 20mg  daily, fenofibrate 135mg  daily  Intolerances: Atorvastatin 40-80mg  daily (muscle pains)  Risk Factors: progressive CAD with multivessel disease, HTN, former smoker, family history  LDL goal: < 55mg /dL  Diet: Does not fry food at home, cooks using virgin olive oil and uses the oven. Eats rice, vegetables,  and grilled chicken. Breakfast: 2 eggs, grits, 1 slice of toast. Does not eat lunch. Spaghetti for dinner. Drinks water and some ginger ale. Does not enjoy sweets.  Exercise: Walks 2 miles using a treadmill every day; occasionally bikes for 15-20 minutes once weekly  Family History: Hx of MI in father, diabetes in mother and brother, niacin  Social History: Former smoker (quit 2013, 0.5 PPD, 20 pack years), rare alcohol use  Labs: 06/22/19: TC 156, TG 104, HDL 52, LDL 85 (rosuvastatin 20mg  daily, fenofibrate 135mg  daily) 12/12/18: TC 158, TG 127, HDL 50, LDL 75 (rosuvastatin 20mg  daily, fenofibrate 135mg  daily)  Past Medical History:  Diagnosis Date  . 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 CAD (coronary artery disease)    moderate to severe distal CAD with large epicardial arteries having only mild to moderate irregularities. Last cath in Dec 2006; s/p cath with Promus DES in distal RCA and Promus DES in the mid vessel in an overlapping fashion  . Chest pain   . Complication of anesthesia    heart rate dropping with propofol last procedure  . COPD (chronic obstructive pulmonary disease) ([place])   . ED (erectile dysfunction)   . Esophageal stricture   . External hemorrhoids without mention of complication   . GERD (gastroesophageal reflux disease)   . Headache(784.0)   .  Hiatal hernia   . History of kidney stones   . HLD (hyperlipidemia)   . HTN (hypertension)   . Myocardial infarction ([place])    1996  . Personal history of colonic polyps 03/12/2010   TUBULAR ADENOMA  . Tobacco abuse     Current Outpatient Medications on File Prior to Visit  Medication Sig Dispense Refill  . acetaminophen (TYLENOL) 500 MG tablet Take 1,000 mg by mouth every 6 (six) hours as needed (pain).     Marland Kitchen albuterol (VENTOLIN HFA) 108 (90 Base) MCG/ACT inhaler INHALE 2 PUFFS INTO THE LUNGS EVERY 6 HOURS AS NEEDED FOR WHEEZE OR SHORTNESS OF BREATH 8 g 3  . aspirin EC 81 MG tablet Take 1 tablet (81 mg  total) by mouth every evening. Resume 4 days post-op    . azithromycin (ZITHROMAX) 250 MG tablet Take 2 tabs day 1, then 1 tab daily 6 each 0  . Choline Fenofibrate (FENOFIBRIC ACID) 135 MG CPDR TAKE 1 CAPSULE BY MOUTH EVERY DAY 30 capsule 5  . famotidine (PEPCID) 20 MG tablet Take 20 mg by mouth daily as needed for heartburn.    . hydrochlorothiazide (HYDRODIURIL) 25 MG tablet TAKE 1/2 TABLET BY MOUTH EVERY DAY 15 tablet 5  . isosorbide mononitrate (IMDUR) 60 MG 24 hr tablet Take 1 tablet (60 mg total) by mouth daily. 90 tablet 3  . metoprolol tartrate (LOPRESSOR) 25 MG tablet Take 1 tablet (25 mg total) by mouth 2 (two) times daily. Take addition one tablet 1 hr prior to exercise 180 tablet 3  . nitroGLYCERIN (NITROSTAT) 0.4 MG SL tablet Place 1 tablet (0.4 mg total) under the tongue every 5 (five) minutes as needed for chest pain. 25 tablet 4  . polyethylene glycol (MIRALAX / GLYCOLAX) packet Take 17 g by mouth daily as needed. 14 each 0  . RABEprazole (ACIPHEX) 20 MG tablet Take 1 tablet (20 mg total) by mouth daily at 3 pm. 30 tablet 11  . rosuvastatin (CRESTOR) 20 MG tablet TAKE 1 TABLET BY MOUTH EVERY DAY 30 tablet 5   No current facility-administered medications on file prior to visit.    Allergies  Allergen Reactions  . Amlodipine Besylate Other (See Comments)    Headache, weakness, dizziness  . Propofol Other (See Comments)    Heart rate dropped    . Lisinopril Cough and Rash  . Rosuvastatin Rash and Other (See Comments)     muscle aches   . Metoprolol     Lowered heart rate too much     Assessment/Plan:  1. Hyperlipidemia - LDL remains elevated above goal of < 55mg /dL. Continue taking rosuvastatin 20mg  daily and fenofibrate 135mg  daily. Discussed benefit of starting a PCSK9 injection or Nexlizet to lower his LDL to goal, but he would like to do his own research before starting any new medications. Discussed in detail the efficacy and safety data of PCSK9 inhibitor  injections and Nexlizet. Will follow-up with patient via telephone call in 1 week to discuss the addition of either PCSK9 injections or Nexlizet.  Richardine Service, PharmD PGY1 Pharmacy Resident  [place]. Supple, PharmD, BCACP, [place] [id number] [place], [place] [address] Phone: [phone]; Fax: [phone] 06/26/2019 3:30 PM

## 2019-06-26 NOTE — Patient Instructions (Addendum)
It was nice to meet you today   Your LDL is 85 and your goal is < 55  Repatha and Praluent are injections that have shown to lower LDL cholesterol by 60%, prevent heart attacks and strokes in the future.  This medication is stored in the fridge and is given every 2 weeks into the fatty tissue of your stomach.  Nexlizet is a tablet taking by mouth once daily that has shown to lower LDL cholesterol by 40%.   Continue taking rosuvastatin 20mg  daily and fenofibrate 135mg  daily  Please call us at [phone] if you have any questions or would like to start any of these medications

## 2019-06-27 DIAGNOSIS — Z23 Encounter for immunization: Secondary | ICD-10-CM | POA: Diagnosis not present

## 2019-07-06 ENCOUNTER — Telehealth: Payer: Self-pay | Admitting: Pharmacist

## 2019-07-06 NOTE — Telephone Encounter (Signed)
Called patient to follow-up on whether he would like to start PCSK9 injections or Nexlizet to help lower his cholesterol levels. After doing his own research, he does not want to start PCSK9 injections at this time. He would like more time to research Nexlizet.   Will follow-up via phone call in 1 week to discuss starting Nexlizet.

## 2019-07-15 ENCOUNTER — Other Ambulatory Visit: Payer: Self-pay | Admitting: Family Medicine

## 2019-08-02 ENCOUNTER — Other Ambulatory Visit: Payer: Self-pay | Admitting: Physician Assistant

## 2019-08-03 ENCOUNTER — Other Ambulatory Visit: Payer: Self-pay

## 2019-08-03 DIAGNOSIS — H35033 Hypertensive retinopathy, bilateral: Secondary | ICD-10-CM | POA: Diagnosis not present

## 2019-08-03 MED ORDER — NITROGLYCERIN 0.4 MG SL SUBL: 0.4000 mg | SUBLINGUAL_TABLET | SUBLINGUAL | 6 refills | Status: DC | PRN

## 2019-08-03 NOTE — Telephone Encounter (Signed)
Outpatient Medication Detail   Disp Refills Start End   nitroGLYCERIN (NITROSTAT) 0.4 MG SL tablet 25 tablet 6 08/03/2019    Sig - Route: Place 1 tablet (0.4 mg total) under the tongue every 5 (five) minutes as needed for chest pain. - Sublingual   Sent to pharmacy as: nitroGLYCERIN (NITROSTAT) 0.4 MG SL tablet   E-Prescribing Status: Receipt confirmed by pharmacy (08/03/2019 10:00 AM EDT)   Pharmacy  [id number] - [place], [place]

## 2019-08-04 ENCOUNTER — Other Ambulatory Visit: Payer: Self-pay

## 2019-08-04 MED ORDER — NITROGLYCERIN 0.4 MG SL SUBL: 0.4000 mg | SUBLINGUAL_TABLET | SUBLINGUAL | 6 refills | Status: DC | PRN

## 2019-08-22 ENCOUNTER — Other Ambulatory Visit: Payer: Self-pay

## 2019-08-22 ENCOUNTER — Encounter: Payer: Self-pay | Admitting: Family Medicine

## 2019-08-22 ENCOUNTER — Ambulatory Visit (INDEPENDENT_AMBULATORY_CARE_PROVIDER_SITE_OTHER): Payer: BC Managed Care – PPO | Admitting: Family Medicine

## 2019-08-22 VITALS — BP 142/78 | HR 69 | Temp 98.0°F | Ht 70.0 in | Wt 198.0 lb

## 2019-08-22 DIAGNOSIS — I10 Essential (primary) hypertension: Secondary | ICD-10-CM

## 2019-08-22 DIAGNOSIS — Z0001 Encounter for general adult medical examination with abnormal findings: Secondary | ICD-10-CM | POA: Diagnosis not present

## 2019-08-22 DIAGNOSIS — R739 Hyperglycemia, unspecified: Secondary | ICD-10-CM | POA: Diagnosis not present

## 2019-08-22 DIAGNOSIS — I25119 Atherosclerotic heart disease of native coronary artery with unspecified angina pectoris: Secondary | ICD-10-CM | POA: Diagnosis not present

## 2019-08-22 DIAGNOSIS — E785 Hyperlipidemia, unspecified: Secondary | ICD-10-CM

## 2019-08-22 DIAGNOSIS — Z125 Encounter for screening for malignant neoplasm of prostate: Secondary | ICD-10-CM | POA: Diagnosis not present

## 2019-08-22 LAB — URINALYSIS, ROUTINE W REFLEX MICROSCOPIC
Bilirubin Urine: NEGATIVE
Hgb urine dipstick: NEGATIVE
Ketones, ur: NEGATIVE
Leukocytes,Ua: NEGATIVE
Nitrite: NEGATIVE
RBC / HPF: NONE SEEN (ref 0–?)
Specific Gravity, Urine: 1.015 (ref 1.000–1.030)
Total Protein, Urine: NEGATIVE
Urine Glucose: NEGATIVE
Urobilinogen, UA: 0.2 (ref 0.0–1.0)
pH: 7 (ref 5.0–8.0)

## 2019-08-22 LAB — PSA: PSA: 0.39 ng/mL (ref 0.10–4.00)

## 2019-08-22 LAB — TSH: TSH: 3.83 u[IU]/mL (ref 0.35–4.50)

## 2019-08-22 LAB — HEMOGLOBIN A1C: Hgb A1c MFr Bld: 7.5 % — ABNORMAL HIGH (ref 4.6–6.5)

## 2019-08-22 NOTE — Patient Instructions (Addendum)
It was very nice to see you today!  We will check blood work and a urine sample today.   No changes today.  Come back in 1 year for your next physical with blood work, or sooner if needed.  Take care, Dr Jerline Pain  Please try these tips to maintain a healthy lifestyle:   Eat at least 3 REAL meals and 1-2 snacks per day.  Aim for no more than 5 hours between eating.  If you eat breakfast, please do so within one hour of getting up.    Each meal should contain half fruits/vegetables, one quarter protein, and one quarter carbs (no bigger than a computer mouse)   Cut down on sweet beverages. This includes juice, soda, and sweet tea.     Drink at least 1 glass of water with each meal and aim for at least 8 glasses per day   Exercise at least 150 minutes every week.    Preventive Care 4 Years and Older, Male Preventive care refers to lifestyle choices and visits with your health care provider that can promote health and wellness. This includes:  A yearly physical exam. This is also called an annual well check.  Regular dental and eye exams.  Immunizations.  Screening for certain conditions.  Healthy lifestyle choices, such as diet and exercise. What can I expect for my preventive care visit? Physical exam Your health care provider will check:  Height and weight. These may be used to calculate body mass index (BMI), which is a measurement that tells if you are at a healthy weight.  Heart rate and blood pressure.  Your skin for abnormal spots. Counseling Your health care provider may ask you questions about:  Alcohol, tobacco, and drug use.  Emotional well-being.  Home and relationship well-being.  Sexual activity.  Eating habits.  History of falls.  Memory and ability to understand (cognition).  Work and work Statistician. What immunizations do I need?  Influenza (flu) vaccine  This is recommended every year. Tetanus, diphtheria, and pertussis (Tdap)  vaccine  You may need a Td booster every 10 years. Varicella (chickenpox) vaccine  You may need this vaccine if you have not already been vaccinated. Zoster (shingles) vaccine  You may need this after age 80. Pneumococcal conjugate (PCV13) vaccine  One dose is recommended after age 40. Pneumococcal polysaccharide (PPSV23) vaccine  One dose is recommended after age 68. Measles, mumps, and rubella (MMR) vaccine  You may need at least one dose of MMR if you were born in 1957 or later. You may also need a second dose. Meningococcal conjugate (MenACWY) vaccine  You may need this if you have certain conditions. Hepatitis A vaccine  You may need this if you have certain conditions or if you travel or work in places where you may be exposed to hepatitis A. Hepatitis B vaccine  You may need this if you have certain conditions or if you travel or work in places where you may be exposed to hepatitis B. Haemophilus influenzae type b (Hib) vaccine  You may need this if you have certain conditions. You may receive vaccines as individual doses or as more than one vaccine together in one shot (combination vaccines). Talk with your health care provider about the risks and benefits of combination vaccines. What tests do I need? Blood tests  Lipid and cholesterol levels. These may be checked every 5 years, or more frequently depending on your overall health.  Hepatitis C test.  Hepatitis B test. Screening  Lung cancer screening. You may have this screening every year starting at age 26 if you have a 30-pack-year history of smoking and currently smoke or have quit within the past 15 years.  Colorectal cancer screening. All adults should have this screening starting at age 76 and continuing until age 23. Your health care provider may recommend screening at age 69 if you are at increased risk. You will have tests every 1-10 years, depending on your results and the type of screening test.   Prostate cancer screening. Recommendations will vary depending on your family history and other risks.  Diabetes screening. This is done by checking your blood sugar (glucose) after you have not eaten for a while (fasting). You may have this done every 1-3 years.  Abdominal aortic aneurysm (AAA) screening. You may need this if you are a current or former smoker.  Sexually transmitted disease (STD) testing. Follow these instructions at home: Eating and drinking  Eat a diet that includes fresh fruits and vegetables, whole grains, lean protein, and low-fat dairy products. Limit your intake of foods with high amounts of sugar, saturated fats, and salt.  Take vitamin and mineral supplements as recommended by your health care provider.  Do not drink alcohol if your health care provider tells you not to drink.  If you drink alcohol: ? Limit how much you have to 0-2 drinks a day. ? Be aware of how much alcohol is in your drink. In the [place] one drink equals one 12 oz bottle of beer (355 mL), one 5 oz glass of wine (148 mL), or one 1 oz glass of hard liquor (44 mL). Lifestyle  Take daily care of your teeth and gums.  Stay active. Exercise for at least 30 minutes on 5 or more days each week.  Do not use any products that contain nicotine or tobacco, such as cigarettes, e-cigarettes, and chewing tobacco. If you need help quitting, ask your health care provider.  If you are sexually active, practice safe sex. Use a condom or other form of protection to prevent STIs (sexually transmitted infections).  Talk with your health care provider about taking a low-dose aspirin or statin. What's next?  Visit your health care provider once a year for a well check visit.  Ask your health care provider how often you should have your eyes and teeth checked.  Stay up to date on all vaccines. This information is not intended to replace advice given to you by your health care provider. Make sure you discuss  any questions you have with your health care provider. Document Revised: 03/31/2018 Document Reviewed: 03/31/2018 Elsevier Patient Education  2020 [employer].

## 2019-08-22 NOTE — Assessment & Plan Note (Signed)
Continue management per cardiology. 

## 2019-08-22 NOTE — Progress Notes (Signed)
Chief Complaint:  Daniel Carey is a 69 y.o. male who presents today for his annual comprehensive physical exam.    Assessment/Plan:  Chronic Problems Addressed Today: Hyperglycemia Check A1c.  CAD (coronary artery disease) Stable.  Continue medications per cardiology.  Currently on statin and aspirin.  Essential hypertension Continue management per cardiology.  Hyperlipidemia Recent lipid panel at goal.  Continue fenofibrate and Crestor.   Body mass index is 28.41 kg/m. / Overweight BMI Metric Follow Up - 08/22/19 1346      BMI Metric Follow Up-Please document annually   BMI Metric Follow Up  Education provided       Preventative Healthcare: Up-to-date on colon cancer screening.  Will check PSA today.  Patient Counseling(The following topics were reviewed and/or handout was given):  -Nutrition: Stressed importance of moderation in sodium/caffeine intake, saturated fat and cholesterol, caloric balance, sufficient intake of fresh fruits, vegetables, and fiber.  -Stressed the importance of regular exercise.   -Substance Abuse: Discussed cessation/primary prevention of tobacco, alcohol, or other drug use; driving or other dangerous activities under the influence; availability of treatment for abuse.   -Injury prevention: Discussed safety belts, safety helmets, smoke detector, smoking near bedding or upholstery.   -Sexuality: Discussed sexually transmitted diseases, partner selection, use of condoms, avoidance of unintended pregnancy and contraceptive alternatives.   -Dental health: Discussed importance of regular tooth brushing, flossing, and dental visits.  -Health maintenance and immunizations reviewed. Please refer to Health maintenance section.  Return to care in 1 year for next preventative visit.     Subjective:  HPI:  He has no acute complaints today.   Lifestyle Diet: None specific.  Exercise: Tries to walk 2-3 times weekly on treadmill.   Depression  screen PHQ 2/9 12/02/2018  Decreased Interest 0  Down, Depressed, Hopeless 0  PHQ - 2 Score 0    Health Maintenance Due  Topic Date Due  . Hepatitis C Screening  Never done  . URINE MICROALBUMIN  Never done     ROS: Per HPI, otherwise a complete review of systems was negative.   PMH:  The following were reviewed and entered/updated in epic: Past Medical History:  Diagnosis Date  . 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 CAD (coronary artery disease)    moderate to severe distal CAD with large epicardial arteries having only mild to moderate irregularities. Last cath in Dec 2006; s/p cath with Promus DES in distal RCA and Promus DES in the mid vessel in an overlapping fashion  . Chest pain   . Complication of anesthesia    heart rate dropping with propofol last procedure  . COPD (chronic obstructive pulmonary disease) ([place])   . ED (erectile dysfunction)   . Esophageal stricture   . External hemorrhoids without mention of complication   . GERD (gastroesophageal reflux disease)   . Headache(784.0)   . Hiatal hernia   . History of kidney stones   . HLD (hyperlipidemia)   . HTN (hypertension)   . Myocardial infarction ([place])    1996  . Personal history of colonic polyps 03/12/2010   TUBULAR ADENOMA  . Tobacco abuse    Patient Active Problem List   Diagnosis Date Noted  . Hyperglycemia 08/22/2019  . Pacemaker 06/22/2019  . COPD mixed type ([place]) 12/02/2018  . Spinal stenosis of lumbar region 05/12/2018  . Obstructive sleep apnea 04/26/2014  . Heart block 07/28/2011  . Diverticulosis of colon (without mention of hemorrhage) 07/08/2011  .  History of colonic polyps 12/23/2010  . HIATAL HERNIA 12/22/2007  . Hyperlipidemia 12/30/2006  . Essential hypertension 12/30/2006  . CAD (coronary artery disease) 12/30/2006  . GERD 12/30/2006   Past Surgical History:  Procedure Laterality Date  . ANGIOPLASTY     X3  . COLONOSCOPY N/A 02/16/2017   Procedure: COLONOSCOPY;   Surgeon: Jerene Bears, MD;  [facility];  Service: Gastroenterology;  Laterality: N/A;  . INSERT / REPLACE / REMOVE PACEMAKER  06/20/2018  . LEFT HEART CATH AND CORONARY ANGIOGRAPHY N/A 11/05/2016   Procedure: Left Heart Cath and Coronary Angiography;  Surgeon: Nelva Bush, MD;  Location: [facility];  Service: Cardiovascular;  Laterality: N/A;  . LUMBAR LAMINECTOMY/DECOMPRESSION MICRODISCECTOMY N/A 05/12/2018   Procedure: Microlumbar decompression L2-3, possible L1-2;  Surgeon: Susa Day, MD;  Location: [facility];  Service: Orthopedics;  Laterality: N/A;  2 hrs, Microlumbar decompression L2-3, possible L1-2  . PACEMAKER IMPLANT N/A 06/20/2018   Procedure: PACEMAKER IMPLANT;  Surgeon: Evans Lance, MD;  Location: [facility];  Service: Cardiovascular;  Laterality: N/A;  . PERCUTANEOUS CORONARY ROTOBLATOR INTERVENTION (PCI-R) N/A 06/30/2012   Procedure: PERCUTANEOUS CORONARY ROTOBLATOR INTERVENTION (PCI-R);  Surgeon: Burnell Blanks, MD;  Location: [facility] CATH LAB;  Service: Cardiovascular;  Laterality: N/A;  . VIDEO BRONCHOSCOPY  08/13/2011   Procedure: VIDEO BRONCHOSCOPY WITHOUT FLUORO;  Surgeon: Tanda Rockers, MD;  [facility];  Service: Cardiopulmonary;  Laterality: Bilateral;    Family History  Problem Relation Age of Onset  . Heart disease Father        hx of MI  . Diabetes Mother        and brother   . Kidney failure Mother   . Breast cancer Sister   . Colon cancer Neg Hx   . Stomach cancer Neg Hx     Medications- reviewed and updated Current Outpatient Medications  Medication Sig Dispense Refill  . acetaminophen (TYLENOL) 500 MG tablet Take 1,000 mg by mouth every 6 (six) hours as needed (pain).     Marland Kitchen albuterol (VENTOLIN HFA) 108 (90 Base) MCG/ACT inhaler INHALE 2 PUFFS INTO THE LUNGS EVERY 6 HOURS AS NEEDED FOR WHEEZE OR SHORTNESS OF BREATH 8 g 3  . aspirin EC 81 MG tablet Take 1 tablet (81 mg total) by mouth every evening. Resume 4  days post-op    . Choline Fenofibrate (FENOFIBRIC ACID) 135 MG CPDR TAKE 1 CAPSULE BY MOUTH EVERY DAY 30 capsule 5  . famotidine (PEPCID) 20 MG tablet Take 20 mg by mouth daily as needed for heartburn.    . hydrochlorothiazide (HYDRODIURIL) 25 MG tablet TAKE 1/2 TABLET BY MOUTH EVERY DAY 15 tablet 5  . isosorbide mononitrate (IMDUR) 60 MG 24 hr tablet Take 1 tablet (60 mg total) by mouth daily. 90 tablet 3  . metoprolol tartrate (LOPRESSOR) 25 MG tablet Take 1 tablet (25 mg total) by mouth 2 (two) times daily. Take addition one tablet 1 hr prior to exercise 180 tablet 3  . nitroGLYCERIN (NITROSTAT) 0.4 MG SL tablet Place 1 tablet (0.4 mg total) under the tongue every 5 (five) minutes as needed for chest pain. 25 tablet 6  . polyethylene glycol (MIRALAX / GLYCOLAX) packet Take 17 g by mouth daily as needed. 14 each 0  . RABEprazole (ACIPHEX) 20 MG tablet Take 1 tablet (20 mg total) by mouth daily at 3 pm. 30 tablet 11  . rosuvastatin (CRESTOR) 20 MG tablet TAKE 1 TABLET BY MOUTH EVERY DAY 30  tablet 5   No current facility-administered medications for this visit.    Allergies-reviewed and updated Allergies  Allergen Reactions  . Amlodipine Besylate Other (See Comments)    Headache, weakness, dizziness  . Propofol Other (See Comments)    Heart rate dropped    . Lisinopril Cough and Rash  . Rosuvastatin Rash and Other (See Comments)     muscle aches   . Metoprolol     Lowered heart rate too much     Social History   Socioeconomic History  . Marital status: Married    Spouse name: Not on file  . Number of children: 1  . Years of education: Not on file  . Highest education level: Not on file  Occupational History  . Occupation: Radio broadcast assistant  Tobacco Use  . Smoking status: Former Smoker    Packs/day: 0.50    Years: 40.00    Pack years: 20.00    Types: Cigarettes    Quit date: 08/25/2011    Years since quitting: 7.9  . Smokeless tobacco: Never Used  Substance and  Sexual Activity  . Alcohol use: Yes    Comment: rare  . Drug use: No  . Sexual activity: Not Currently  Other Topics Concern  . Not on file  Social History Narrative  . Not on file   Social Determinants of Health   Financial Resource Strain:   . Difficulty of Paying Living Expenses:   Food Insecurity:   . Worried About Charity fundraiser in the Last Year:   . Arboriculturist in the Last Year:   Transportation Needs:   . Film/video editor (Medical):   Marland Kitchen Lack of Transportation (Non-Medical):   Physical Activity:   . Days of Exercise per Week:   . Minutes of Exercise per Session:   Stress:   . Feeling of Stress :   Social Connections:   . Frequency of Communication with Friends and Family:   . Frequency of Social Gatherings with Friends and Family:   . Attends Religious Services:   . Active Member of Clubs or Organizations:   . Attends Archivist Meetings:   Marland Kitchen Marital Status:         Objective:  Physical Exam: BP (!) 142/78   Pulse 69   Temp 98 F (36.7 C)   Ht 5\' 10"  (1.778 m)   Wt 198 lb (89.8 kg)   SpO2 97%   BMI 28.41 kg/m   Body mass index is 28.41 kg/m. Wt Readings from Last 3 Encounters:  08/22/19 198 lb (89.8 kg)  06/22/19 197 lb 12.8 oz (89.7 kg)  12/21/18 191 lb (86.6 kg)   Gen: NAD, resting comfortably HEENT: TMs normal bilaterally. OP clear. No thyromegaly noted.  CV: RRR with no murmurs appreciated Pulm: NWOB, CTAB with no crackles, wheezes, or rhonchi GI: Normal bowel sounds present. Soft, Nontender, Nondistended. MSK: no edema, cyanosis, or clubbing noted Skin: warm, dry Neuro: CN2-12 grossly intact. Strength 5/5 in upper and lower extremities. Reflexes symmetric and intact bilaterally.  Psych: Normal affect and thought content     Laguana Desautel M. Jerline Pain, MD 08/22/2019 1:46 PM

## 2019-08-22 NOTE — Assessment & Plan Note (Signed)
Recent lipid panel at goal.  Continue fenofibrate and Crestor.

## 2019-08-22 NOTE — Assessment & Plan Note (Signed)
Check A1c. 

## 2019-08-22 NOTE — Assessment & Plan Note (Signed)
Stable.  Continue medications per cardiology.  Currently on statin and aspirin.

## 2019-08-23 NOTE — Progress Notes (Signed)
Please inform patient of the following:  A1c elevated into diabetic range at 7.5 but everything else is normal.  Recommend starting Metformin 750 mg daily.  He should continue working on diet and exercise.  We should recheck in 3 months or he should follow-up with endocrinology ASAP.

## 2019-09-25 ENCOUNTER — Ambulatory Visit (INDEPENDENT_AMBULATORY_CARE_PROVIDER_SITE_OTHER): Payer: BC Managed Care – PPO | Admitting: *Deleted

## 2019-09-25 DIAGNOSIS — I459 Conduction disorder, unspecified: Secondary | ICD-10-CM | POA: Diagnosis not present

## 2019-09-25 LAB — CUP PACEART REMOTE DEVICE CHECK
Battery Remaining Longevity: 112 mo
Battery Remaining Percentage: 95.5 %
Battery Voltage: 3.01 V
Brady Statistic AP VP Percent: 4 %
Brady Statistic AP VS Percent: 1 %
Brady Statistic AS VP Percent: 96 %
Brady Statistic AS VS Percent: 1 %
Brady Statistic RA Percent Paced: 3.7 %
Brady Statistic RV Percent Paced: 99 %
Date Time Interrogation Session: 20210606010029
Implantable Lead Implant Date: 20200302
Implantable Lead Implant Date: 20200302
Implantable Lead Location: 753859
Implantable Lead Location: 753860
Implantable Lead Model: 3830
Implantable Pulse Generator Implant Date: 20200302
Lead Channel Impedance Value: 530 Ohm
Lead Channel Impedance Value: 540 Ohm
Lead Channel Pacing Threshold Amplitude: 0.75 V
Lead Channel Pacing Threshold Amplitude: 1 V
Lead Channel Pacing Threshold Pulse Width: 0.5 ms
Lead Channel Pacing Threshold Pulse Width: 0.5 ms
Lead Channel Sensing Intrinsic Amplitude: 3 mV
Lead Channel Sensing Intrinsic Amplitude: 9 mV
Lead Channel Setting Pacing Amplitude: 2 V
Lead Channel Setting Pacing Amplitude: 2.5 V
Lead Channel Setting Pacing Pulse Width: 0.5 ms
Lead Channel Setting Sensing Sensitivity: 2 mV
Pulse Gen Model: 2272
Pulse Gen Serial Number: 9114136

## 2019-09-27 NOTE — Progress Notes (Signed)
Remote pacemaker transmission.   

## 2019-10-04 DIAGNOSIS — G4733 Obstructive sleep apnea (adult) (pediatric): Secondary | ICD-10-CM | POA: Diagnosis not present

## 2019-11-09 ENCOUNTER — Other Ambulatory Visit: Payer: Self-pay | Admitting: Cardiovascular Disease

## 2019-11-09 MED ORDER — METOPROLOL TARTRATE 25 MG PO TABS: 25.0000 mg | ORAL_TABLET | Freq: Two times a day (BID) | ORAL | 2 refills | Status: DC

## 2019-12-21 ENCOUNTER — Encounter: Payer: Self-pay | Admitting: Cardiovascular Disease

## 2019-12-21 NOTE — Progress Notes (Signed)
Daniel Carey Date of Birth  July 18, 1950       [facility] [address], [name], [name]  [address]   [place], [place]  [address] [phone]     [phone]   Fax  [phone]    Fax [phone]  Problem List: 1. CAD - 2.5 x 38 mm Promus Premier DES in the distal RCA and a 2.75 x 38 mm Promus Premier DES in the mid vessel in an overlapping fashion. The distal stent was post-dilated with a 2.5 x 20 mm [place] balloon. The proximal portion of the stent was post-dilated with a 3.0 x 20 mm [place] balloon ( June 30, 2012) current: The care  2. Bradycardia after getting propafol for endoscopy. 3. Obstructive sleep apnea:   :  Daniel Carey is feeling well today.  He's been getting some exercise. He does yard work and walks on the treadmill occasionally. He had an episode of bradycardia after getting IV protocol for an endoscopy procedure. He was seen by [name]. His metoprolol dose was decreased. He has also had a bronchoscopy with [name]. He does not have any evidence of COPD.  Both he and his wife have stop smoking.  ( Aug 20, 2011)  Feb. 28, 2014:  He continues to have increasing chest p pain Dr. Edwinna Areola ain.  He has not been smoking.  The pains are associated with exertion, last 5-10 minutes, resolve with 1 SL NTG.   He's had a mildly elevated TSH for the past year or so and has had some fatigue.    his TSH level 6.8.      His medical doctor started him on Synthroid. He gradually has titrated up to 0.1 mg a day. Since that time he's had increasing episodes of chest pain. All so the house that taxes at the same time present all is doing the same time in the summer and not a [facility] Aug 29, 2012: We will show  Daniel Carey saw him in February we performed a cardiac catheterization because of increasing chest pain. He was found to have sequential stenosis in his mid right coronary artery and had PCI of his right coronary artery ( 2.5 x  38 mm Promus Premier DES in the distal RCA and a 2.75 x 38 mm Promus Premier DES in the mid vessel in an overlapping fashion. The distal sten was post-dilated with a 2.5 x 20 mm [place] balloon. The proximal portion of the stent was post-dilated with a 3.0 x 20 mm [place] balloon)  He and his wife are concerned about whether or not the Plavix is active.  His generic plavix manufacturer has changed.   He would like to be tested.   October 14, 2012:  Daniel Carey has been having some occasional CP.  He has had some episodes of dyspnea while climbing up a hill playing golf.  This past Monday night, he had some chest pain - possible due to indigestions.  these  He had lost his NTG - he has refilled it now.    Nov. 10, 2014:  Daniel Carey is doing well.  He is working too much according to wife. he's had a few CP but not severe enough to take NTG.  Still has some dyspnea with climbing steps.   He had stenting in march 2014.  October 23, 2013:  Daniel Carey is doing well.   Playing golf.  Working in the yard.  Not  exercising as much.   He has lost about 6 lbs.  He had labs drawn today.     Jan. 7, 2016:  He has been waking up in the middle of the night and feels like he cant catch his  Breath. Still working out - on the treadmill twice this week and has done well.   Sept. 1, 2016:  Has had some short of breath. Does not get any exercise.  His CP is much better since starting CPAP .  Is having bloating  - thinks its due to the lipitor   July 19, 2015: Has had some shortness of breath.   With exertion . Happens occasionally but seem to be getting more frequently .  BP is elevated today  Has cut back on his salt .   Sept. 21, 2017:  Daniel Carey is seen today for follow up visit for his CAD Occasionally short of breath . Comes and goes .  BP is a little elevated today Has occasional CP  Able to do all that he wants to do   November 04, 2016: Still having some chest pain Not exertional ,  Seems very similar to his previous  episodes of angina prior to stenting .   July 31 , 2108  Daniel Carey is seen today for follow up of a recent cath.  He was found to have diffuse moderate - severe CAD - especially in the LAD  Was started on Imdur - is having a headache with that. Angina seems to be slightly  better.  He still has marked DOE - wife is very concerned about this .   is going to see pulmonary soon .  Sept. 24, 2018:  Daniel Carey is doing very well. His chest pain seemed to be better. Getting some exercise   A little bit more salt than usual over the weekend. His diastolic blood pressure today is 90.  Nov. 8, 2018:    Daniel Carey is seen today  Has worsening dyspnea.   Chest tightness Also had a bladder infection  - burning with urination. ,   Fevers, chills.   Temp of 99.8 Still having fevers and chills  His dyspnea seems to be worse with his chills.  No blood in urine,   extremy dysuria and frequency + cough , no hemoptysis, No pleuretic CP  No real angina   April 08, 2017:  Daniel Carey is seen back for follow up visit Has urosepsis when I last saw him ( UTI with bacteremia )  Treated with ABx Now is having dysuria .   Is seeing urologist tomorrow .   No serious angina .   December 23, 2017:  Daniel Carey is seen today for follow-up of his coronary artery disease. Had a colonoscopy  Complicated by a E.coli UTI when I last saw him  Rare episodes of CP   Sept. 2, 2020   Doing well Had a pacer placed a month ago  Feels beter Has some CP if he starts walks on the treadmill.   Is able to walk through it and usually does ok  Lasts for a minutes.  Last cath was July 2019.  Conclusions: 1. Multivessel coronary artery disease, including diffuse calcified LAD disease of up to 60-70% (FFR 0.73), moderate to severe LCx and OM3 disease (FFR 0.83 at submaximal hyperemia), and moderate in-stent restenosis of mid RCA. 2. Normal left ventricular filling pressure. 3. Normal left ventricular systolic  function.  Recommendations: 1. Escalate antianginal therapy with addition of isosorbide mononitrate.  2. Continue aggressive secondary prevention, including dual antiplatelet and statin therapy. 3. I will review the films with the intervention and cardiac surgery teams. Revascularization options include atherectomy and PCI to LAD (would require multiple stents) versus CABG.  He has found that if he takes an extra metoprolol about an hour before he exercises that his angina is much better controlled.  We will have him continue that dosing scheme.  June 22, 2019:  Occasional CP ,  Has some chest pressure while on the treadmill.   Rests for 5-10 min then the discomfort resolves Has not improved with takeing metoprolol before his workout.  He also notes he is having more shortness of breath.  Has cut his salt intake .  BP is elevated today .   Has not been elevated at home   His last heart catheterization was in 2018.  He was found to have diffuse coronary artery disease.  We increased medical therapy.  Tested + for Covid in January.   May still be recovering from that    Sept. 3, 2021: Seen for follow up of his CAD, HLD,  S/p pacer for bradycardia  Still has some occasional episodes of CP when he is going to bed.  Atypical  No CP with activity  No pressure  Just pins and needles sensation    Current Outpatient Medications on File Prior to Visit  Medication Sig Dispense Refill  . acetaminophen (TYLENOL) 500 MG tablet Take 1,000 mg by mouth every 6 (six) hours as needed (pain).     Marland Kitchen albuterol (VENTOLIN HFA) 108 (90 Base) MCG/ACT inhaler INHALE 2 PUFFS INTO THE LUNGS EVERY 6 HOURS AS NEEDED FOR WHEEZE OR SHORTNESS OF BREATH 8 g 3  . aspirin EC 81 MG tablet Take 1 tablet (81 mg total) by mouth every evening. Resume 4 days post-op    . Choline Fenofibrate (FENOFIBRIC ACID) 135 MG CPDR TAKE 1 CAPSULE BY MOUTH EVERY DAY 30 capsule 5  . famotidine (PEPCID) 20 MG tablet Take 20 mg by mouth  daily as needed for heartburn.    . hydrochlorothiazide (HYDRODIURIL) 25 MG tablet TAKE 1/2 TABLET BY MOUTH EVERY DAY 15 tablet 5  . isosorbide mononitrate (IMDUR) 60 MG 24 hr tablet Take 1 tablet (60 mg total) by mouth daily. 90 tablet 3  . metoprolol tartrate (LOPRESSOR) 25 MG tablet Take 1 tablet (25 mg total) by mouth 2 (two) times daily. Take addition one tablet 1 hr prior to exercise 225 tablet 2  . nitroGLYCERIN (NITROSTAT) 0.4 MG SL tablet Place 1 tablet (0.4 mg total) under the tongue every 5 (five) minutes as needed for chest pain. 25 tablet 6  . polyethylene glycol (MIRALAX / GLYCOLAX) packet Take 17 g by mouth daily as needed. 14 each 0  . RABEprazole (ACIPHEX) 20 MG tablet Take 1 tablet (20 mg total) by mouth daily at 3 pm. 30 tablet 11  . rosuvastatin (CRESTOR) 20 MG tablet TAKE 1 TABLET BY MOUTH EVERY DAY 30 tablet 5   No current facility-administered medications on file prior to visit.    Allergies  Allergen Reactions  . Amlodipine Besylate Other (See Comments)    Headache, weakness, dizziness  . Propofol Other (See Comments)    Heart rate dropped    . Lisinopril Cough and Rash  . Rosuvastatin Rash and Other (See Comments)     muscle aches   . Metoprolol     Lowered heart rate too much     Past Medical History:  Diagnosis Date  . 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 CAD (coronary artery disease)    moderate to severe distal CAD with large epicardial arteries having only mild to moderate irregularities. Last cath in Dec 2006; s/p cath with Promus DES in distal RCA and Promus DES in the mid vessel in an overlapping fashion  . Chest pain   . Complication of anesthesia    heart rate dropping with propofol last procedure  . COPD (chronic obstructive pulmonary disease) ([place])   . ED (erectile dysfunction)   . Esophageal stricture   . External hemorrhoids without mention of complication   . GERD (gastroesophageal reflux disease)   . Headache(784.0)   .  Hiatal hernia   . History of kidney stones   . HLD (hyperlipidemia)   . HTN (hypertension)   . Myocardial infarction ([place])    1996  . Personal history of colonic polyps 03/12/2010   TUBULAR ADENOMA  . Tobacco abuse     Past Surgical History:  Procedure Laterality Date  . ANGIOPLASTY     X3  . COLONOSCOPY N/A 02/16/2017   Procedure: COLONOSCOPY;  Surgeon: Jerene Bears, MD;  [facility];  Service: Gastroenterology;  Laterality: N/A;  . INSERT / REPLACE / REMOVE PACEMAKER  06/20/2018  . LEFT HEART CATH AND CORONARY ANGIOGRAPHY N/A 11/05/2016   Procedure: Left Heart Cath and Coronary Angiography;  Surgeon: Nelva Bush, MD;  Location: [facility];  Service: Cardiovascular;  Laterality: N/A;  . LUMBAR LAMINECTOMY/DECOMPRESSION MICRODISCECTOMY N/A 05/12/2018   Procedure: Microlumbar decompression L2-3, possible L1-2;  Surgeon: Susa Day, MD;  Location: [facility];  Service: Orthopedics;  Laterality: N/A;  2 hrs, Microlumbar decompression L2-3, possible L1-2  . PACEMAKER IMPLANT N/A 06/20/2018   Procedure: PACEMAKER IMPLANT;  Surgeon: Evans Lance, MD;  Location: [facility];  Service: Cardiovascular;  Laterality: N/A;  . PERCUTANEOUS CORONARY ROTOBLATOR INTERVENTION (PCI-R) N/A 06/30/2012   Procedure: PERCUTANEOUS CORONARY ROTOBLATOR INTERVENTION (PCI-R);  Surgeon: Burnell Blanks, MD;  [facility];  Service: Cardiovascular;  Laterality: N/A;  . VIDEO BRONCHOSCOPY  08/13/2011   Procedure: VIDEO BRONCHOSCOPY WITHOUT FLUORO;  Surgeon: Tanda Rockers, MD;  [facility];  Service: Cardiopulmonary;  Laterality: Bilateral;    Social History   Tobacco Use  Smoking Status Former Smoker  . Packs/day: 0.50  . Years: 40.00  . Pack years: 20.00  . Types: Cigarettes  . Quit date: 08/25/2011  . Years since quitting: 8.3  Smokeless Tobacco Never Used    Social History   Substance and Sexual Activity  Alcohol Use Yes   Comment: rare     Family History  Problem Relation Age of Onset  . Heart disease Father        hx of MI  . Diabetes Mother        and brother   . Kidney failure Mother   . Breast cancer Sister   . Colon cancer Neg Hx   . Stomach cancer Neg Hx     Reviw of Systems:  Reviewed in the HPI.  All other systems are negative.   Physical Exam: There were no vitals taken for this visit.  GEN:  Well nourished, well developed in no acute distress HEENT: Normal NECK: No JVD; No carotid bruits LYMPHATICS: No lymphadenopathy CARDIAC: RRR , no murmurs, rubs, gallops RESPIRATORY:  Clear to auscultation without rales, wheezing or rhonchi  ABDOMEN: Soft, non-tender, non-distended MUSCULOSKELETAL:  No edema; No deformity  SKIN: Warm and dry NEUROLOGIC:  Alert and oriented x 3    ECG:    Sept, 3, 2021:   NSR with atrial sensing and V pacing   Assessment / Plan:   1. COVID :   In Jan.   Continuing to improve   2. CAD  -     No angina.  Cont meds.  His pinching cp that occurs at night is very atypical and is not likely to be duet to angina   3. Hyperlipidemia:    Check labs today  Needs to work on diet .   4.  Obstructive sleep apnea:        6 months to see an APP   Mertie Moores, MD  12/21/2019 9:06 PM    [place] [address],  [place], [place]  [address] Pager [phone] Phone: [phone]; Fax: [phone]

## 2019-12-22 ENCOUNTER — Encounter: Payer: Self-pay | Admitting: Cardiovascular Disease

## 2019-12-22 ENCOUNTER — Ambulatory Visit (INDEPENDENT_AMBULATORY_CARE_PROVIDER_SITE_OTHER): Payer: BC Managed Care – PPO | Admitting: Cardiovascular Disease

## 2019-12-22 ENCOUNTER — Other Ambulatory Visit: Payer: Self-pay

## 2019-12-22 VITALS — BP 128/78 | HR 67 | Ht 70.0 in | Wt 194.8 lb

## 2019-12-22 DIAGNOSIS — E782 Mixed hyperlipidemia: Secondary | ICD-10-CM

## 2019-12-22 DIAGNOSIS — I10 Essential (primary) hypertension: Secondary | ICD-10-CM | POA: Diagnosis not present

## 2019-12-22 NOTE — Patient Instructions (Signed)
Medication Instructions:  Your provider recommends that you continue on your current medications as directed. Please refer to the Current Medication list given to you today.   *If you need a refill on your cardiac medications before your next appointment, please call your pharmacy*  Lab Work: TODAY! BMET, lipid, liver  If you have labs (blood work) drawn today and your tests are completely normal, you will receive your results only by:  [place] (if you have MyChart) OR  A paper copy in the mail If you have any lab test that is abnormal or we need to change your treatment, we will call you to review the results.  Follow-Up: At [facility], you and your health needs are our priority.  As part of our continuing mission to provide you with exceptional heart care, we have created designated Provider Care Teams.  These Care Teams include your primary Cardiologist (physician) and Advanced Practice Providers (APPs -  Physician Assistants and Nurse Practitioners) who all work together to provide you with the care you need, when you need it. Your next appointment:   6 month(s) The format for your next appointment:   In Person Provider:    Richardson Dopp, PA-C  Robbie Lis, PA-C

## 2019-12-23 LAB — HEPATIC FUNCTION PANEL
ALT: 17 IU/L (ref 0–44)
AST: 15 IU/L (ref 0–40)
Albumin: 4.6 g/dL (ref 3.8–4.8)
Alkaline Phosphatase: 63 IU/L (ref 48–121)
Bilirubin Total: 0.4 mg/dL (ref 0.0–1.2)
Bilirubin, Direct: 0.17 mg/dL (ref 0.00–0.40)
Total Protein: 7 g/dL (ref 6.0–8.5)

## 2019-12-23 LAB — BASIC METABOLIC PANEL
BUN/Creatinine Ratio: 15 (ref 10–24)
BUN: 16 mg/dL (ref 8–27)
CO2: 27 mmol/L (ref 20–29)
Calcium: 10 mg/dL (ref 8.6–10.2)
Chloride: 99 mmol/L (ref 96–106)
Creatinine, Ser: 1.06 mg/dL (ref 0.76–1.27)
GFR calc Af Amer: 83 mL/min/{1.73_m2} (ref >59)
GFR calc non Af Amer: 72 mL/min/{1.73_m2} (ref >59)
Glucose: 102 mg/dL — ABNORMAL HIGH (ref 65–99)
Potassium: 4.6 mmol/L (ref 3.5–5.2)
Sodium: 139 mmol/L (ref 134–144)

## 2019-12-23 LAB — LIPID PANEL
Chol/HDL Ratio: 3.4 ratio (ref 0.0–5.0)
Cholesterol, Total: 163 mg/dL (ref 100–199)
HDL: 48 mg/dL (ref >39)
LDL Chol Calc (NIH): 74 mg/dL (ref 0–99)
Triglycerides: 251 mg/dL — ABNORMAL HIGH (ref 0–149)
VLDL Cholesterol Cal: 41 mg/dL — ABNORMAL HIGH (ref 5–40)

## 2019-12-25 ENCOUNTER — Other Ambulatory Visit (INDEPENDENT_AMBULATORY_CARE_PROVIDER_SITE_OTHER): Payer: Self-pay | Admitting: Otolaryngology

## 2019-12-26 ENCOUNTER — Ambulatory Visit (INDEPENDENT_AMBULATORY_CARE_PROVIDER_SITE_OTHER): Payer: BC Managed Care – PPO | Admitting: *Deleted

## 2019-12-26 DIAGNOSIS — I441 Atrioventricular block, second degree: Secondary | ICD-10-CM | POA: Diagnosis not present

## 2019-12-26 LAB — CUP PACEART REMOTE DEVICE CHECK
Battery Remaining Longevity: 110 mo
Battery Remaining Percentage: 95.5 %
Battery Voltage: 3.01 V
Brady Statistic AP VP Percent: 6.5 %
Brady Statistic AP VS Percent: 1 %
Brady Statistic AS VP Percent: 93 %
Brady Statistic AS VS Percent: 1 %
Brady Statistic RA Percent Paced: 6.2 %
Brady Statistic RV Percent Paced: 99 %
Date Time Interrogation Session: 20210907081507
Implantable Lead Implant Date: 20200302
Implantable Lead Implant Date: 20200302
Implantable Lead Location: 753859
Implantable Lead Location: 753860
Implantable Lead Model: 3830
Implantable Pulse Generator Implant Date: 20200302
Lead Channel Impedance Value: 530 Ohm
Lead Channel Impedance Value: 580 Ohm
Lead Channel Pacing Threshold Amplitude: 0.75 V
Lead Channel Pacing Threshold Amplitude: 1 V
Lead Channel Pacing Threshold Pulse Width: 0.5 ms
Lead Channel Pacing Threshold Pulse Width: 0.5 ms
Lead Channel Sensing Intrinsic Amplitude: 3.6 mV
Lead Channel Sensing Intrinsic Amplitude: 9 mV
Lead Channel Setting Pacing Amplitude: 2 V
Lead Channel Setting Pacing Amplitude: 2.5 V
Lead Channel Setting Pacing Pulse Width: 0.5 ms
Lead Channel Setting Sensing Sensitivity: 2 mV
Pulse Gen Model: 2272
Pulse Gen Serial Number: 9114136

## 2019-12-28 NOTE — Progress Notes (Signed)
Remote pacemaker transmission.   

## 2020-01-01 ENCOUNTER — Other Ambulatory Visit: Payer: Self-pay | Admitting: Physician Assistant

## 2020-01-02 ENCOUNTER — Encounter (INDEPENDENT_AMBULATORY_CARE_PROVIDER_SITE_OTHER): Payer: Self-pay

## 2020-01-02 ENCOUNTER — Other Ambulatory Visit (INDEPENDENT_AMBULATORY_CARE_PROVIDER_SITE_OTHER): Payer: Self-pay

## 2020-01-02 NOTE — Progress Notes (Unsigned)
Faxed Rx refill for Ipratropium 0.06% spray w/6 refills to [facility] in [place]. @ sprays at night PRN for runny nose.

## 2020-01-02 NOTE — Progress Notes (Signed)
Error

## 2020-01-03 ENCOUNTER — Other Ambulatory Visit: Payer: Self-pay | Admitting: Family Medicine

## 2020-02-01 ENCOUNTER — Other Ambulatory Visit: Payer: Self-pay

## 2020-02-01 ENCOUNTER — Encounter: Payer: Self-pay | Admitting: Family Medicine

## 2020-02-01 ENCOUNTER — Ambulatory Visit (INDEPENDENT_AMBULATORY_CARE_PROVIDER_SITE_OTHER): Payer: BC Managed Care – PPO

## 2020-02-01 DIAGNOSIS — Z23 Encounter for immunization: Secondary | ICD-10-CM

## 2020-02-01 DIAGNOSIS — G4733 Obstructive sleep apnea (adult) (pediatric): Secondary | ICD-10-CM | POA: Diagnosis not present

## 2020-02-29 ENCOUNTER — Telehealth: Payer: Self-pay

## 2020-02-29 NOTE — Telephone Encounter (Signed)
Noted  

## 2020-02-29 NOTE — Telephone Encounter (Signed)
Patient is scheduled for tomorrow with Dr.Parker.   Carmon, RN, [name] Date/Time (Eastern Time): 02/29/2020 12:00:47 PM Confirm and document reason for call. If symptomatic, describe symptoms. ---Pt had flu shot 1 mo ago in [place] and has had pain in the arm since injection. Now the pain is radiating up the neck and down to the elbow. Does the patient have any new or worsening symptoms? ---Yes Will a triage be completed? ---Yes Related visit to physician within the last 2 weeks? ---No Does the PT have any chronic conditions? (i.e. diabetes, asthma, this includes High risk factors for pregnancy, etc.) ---Yes List chronic conditions. ---CAD w/pacemaker Is this a behavioral health or substance abuse call? ---No Guidelines Guideline Title Affirmed Question Affirmed Notes Nurse Date/Time (Eastern Time) Immunization Reactions [1] Over 3 days (72 hours) since shot AND [2] redness, swelling or pain getting worse Carmon, RN, [name] 02/29/2020 12:02:19 PM Disp. Time Eilene Ghazi Time) Disposition Final User 02/29/2020 11:34:56 AM Attempt made - message left Carmon, RN, [name] 02/29/2020 11:45:33 AM Attempt made - message left Carmon, RN, [name] 02/29/2020 12:04:50 PM Call PCP within 24 Hours Yes Carmon, RN, [name] PLEASE NOTE: All timestamps contained within this report are represented as [place] Standard Time. CONFIDENTIALTY NOTICE: This fax transmission is intended only for the addressee. It contains information that is legally privileged, confidential or otherwise protected from use or disclosure. If you are not the intended recipient, you are strictly prohibited from reviewing, disclosing, copying using or disseminating any of this information or taking any action in reliance on or regarding this information. If you have received this fax in error, please notify us immediately by telephone so that we can arrange for its return to [place]. Phone: [phone], Toll-Free: [phone], Fax:  [phone] Page: 2 of 2 Call Id: [id number] [name] Disagree/Comply Comply Caller Understands Yes PreDisposition Call Doctor Care Advice Given Per Guideline CALL PCP WITHIN 24 HOURS: COLD PACK FOR PAIN OR ITCHING AT VACCINE SITE: PAIN MEDICINES: * ACETAMINOPHEN - EXTRA STRENGTH TYLENOL: Take 1,000 mg (two 500 mg pills) every 8 hours as needed. Each Extra Strength Tylenol pill has 500 mg of acetaminophen. The most you should take each day is 3,000 mg (6 pills a day). CALL BACK IF: * Fever occurs * You become worse CARE ADVICE given per Immunization Reactions (Adult) guideline. Referrals REFERRED TO PCP OFFICE

## 2020-03-01 ENCOUNTER — Other Ambulatory Visit: Payer: Self-pay

## 2020-03-01 ENCOUNTER — Ambulatory Visit (INDEPENDENT_AMBULATORY_CARE_PROVIDER_SITE_OTHER): Payer: BC Managed Care – PPO | Admitting: Family Medicine

## 2020-03-01 VITALS — BP 123/68 | HR 64 | Temp 98.0°F | Ht 70.0 in | Wt 193.0 lb

## 2020-03-01 DIAGNOSIS — E785 Hyperlipidemia, unspecified: Secondary | ICD-10-CM | POA: Diagnosis not present

## 2020-03-01 DIAGNOSIS — M25511 Pain in right shoulder: Secondary | ICD-10-CM | POA: Diagnosis not present

## 2020-03-01 DIAGNOSIS — I10 Essential (primary) hypertension: Secondary | ICD-10-CM | POA: Diagnosis not present

## 2020-03-01 NOTE — Assessment & Plan Note (Signed)
At [place]. Continue HCTZ 25mg  daily. metoprolol 25mg  twice daily and imdur 60mg  daily.

## 2020-03-01 NOTE — Progress Notes (Signed)
   Daniel Carey is a 69 y.o. male who presents today for an office visit.  Assessment/Plan:  New/Acute Problems: Right Shoulder Pain Possibly hematoma from recent vaccine but would not expect it to last this long. Subtle painful nodule noted in right deltoid. Discussed ultrasound vs sports med referral. Will place sports med referral. He declined any medications for pain control today.   Chronic Problems Addressed Today: Essential hypertension [place]. Continue HCTZ 25mg  daily. metoprolol 25mg  twice daily and imdur 60mg  daily.   Hyperlipidemia Stable. Recent lipids with high tirglycerides but otherwise at goal. He will continue crestor 10mg  daily and fenofibrate 135mg  daily.      Subjective:  HPI:  Patient should started hurting about 4 weeks ago after getting flu vaccine. Located in right shoulder. Sore to touch. Minimal pain with movement. Pain shooting into neck. Goes down to elbow. No aleve or tylenol tried.        Objective:  Physical Exam: BP 123/68   Pulse 64   Temp 98 F (36.7 C) (Temporal)   Ht 5\' 10"  (1.778 m)   Wt 193 lb (87.5 kg)   SpO2 99%   BMI 27.69 kg/m   Gen: No acute distress, resting comfortably CV: Regular rate and rhythm with no murmurs appreciated Pulm: Normal work of breathing, clear to auscultation bilaterally with no crackles, wheezes, or rhonchi MSK: Right shoulder tender to palpation with painful nodule noted. FROM in all directions. Supraspinatus testing intact. Normal internal and external rotation.  Neuro: Grossly normal, moves all extremities Psych: Normal affect and thought content      Daniel Carey M. Jerline Pain, MD 03/01/2020 1:39 PM

## 2020-03-01 NOTE — Patient Instructions (Addendum)
It was very nice to see you today!  We will place a referral for sports medicine.  No other changes today.   Take care, Dr Jerline Pain  Please try these tips to maintain a healthy lifestyle:   Eat at least 3 REAL meals and 1-2 snacks per day.  Aim for no more than 5 hours between eating.  If you eat breakfast, please do so within one hour of getting up.    Each meal should contain half fruits/vegetables, one quarter protein, and one quarter carbs (no bigger than a computer mouse)   Cut down on sweet beverages. This includes juice, soda, and sweet tea.     Drink at least 1 glass of water with each meal and aim for at least 8 glasses per day   Exercise at least 150 minutes every week.

## 2020-03-01 NOTE — Assessment & Plan Note (Signed)
Stable. Recent lipids with high tirglycerides but otherwise at goal. He will continue crestor 10mg  daily and fenofibrate 135mg  daily.

## 2020-03-04 ENCOUNTER — Ambulatory Visit (INDEPENDENT_AMBULATORY_CARE_PROVIDER_SITE_OTHER): Payer: BC Managed Care – PPO | Admitting: Family Medicine

## 2020-03-04 ENCOUNTER — Encounter: Payer: Self-pay | Admitting: Family Medicine

## 2020-03-04 ENCOUNTER — Other Ambulatory Visit: Payer: Self-pay

## 2020-03-04 ENCOUNTER — Ambulatory Visit: Payer: Self-pay

## 2020-03-04 VITALS — BP 142/84 | HR 69 | Ht 70.0 in | Wt 195.0 lb

## 2020-03-04 DIAGNOSIS — M25511 Pain in right shoulder: Secondary | ICD-10-CM

## 2020-03-04 DIAGNOSIS — M545 Low back pain, unspecified: Secondary | ICD-10-CM

## 2020-03-04 DIAGNOSIS — G8929 Other chronic pain: Secondary | ICD-10-CM

## 2020-03-04 MED ORDER — PREDNISONE 10 MG PO TABS: 30.0000 mg | ORAL_TABLET | Freq: Every day | ORAL | 0 refills | Status: DC

## 2020-03-04 NOTE — Progress Notes (Signed)
I, Daniel Carey, LAT, ATC acting as a scribe for Daniel Leader, MD.  Subjective:    I'm seeing this patient as a consultation for [name]. Note will be routed back to referring provider/PCP.  CC: R shoulder pn  HPI: Pt was last seen by PCP on 03/01/20 and dx w/ possible hematoma from recent vaccine and subtle painful nodule noted in right deltoid. Today, pt reports soreness still from flu shot. Pt located pn to right shoulder and sometimes pn shoots up into R neck. No prior negative reactions from flu shots.  Pt also c/o LBPn on R side. No numbness/tingling in LE. No known MOI.  He has a history of back pain requiring surgery in the past.  Past medical history, Surgical history, Family history, Social history, Allergies, and medications have been entered into the medical record, reviewed.   Review of Systems: No new headache, visual changes, nausea, vomiting, diarrhea, constipation, dizziness, abdominal pain, skin rash, fevers, chills, night sweats, weight loss, swollen lymph nodes, body aches, joint swelling, muscle aches, chest pain, shortness of breath, mood changes, visual or auditory hallucinations.   Objective:    Vitals:   03/04/20 1424  BP: (!) 142/84  Pulse: 69  SpO2: 98%   General: Well Developed, well nourished, and in no acute distress.  Neuro/Psych: Alert and oriented x3, extra-ocular muscles intact, able to move all 4 extremities, sensation grossly intact. Skin: Warm and dry, no rashes noted.  Respiratory: Not using accessory muscles, speaking in full sentences, trachea midline.  Cardiovascular: Pulses palpable, no extremity edema. Abdomen: Does not appear distended. MSK:  Right shoulder normal-appearing Mildly tender palpation superior posterior deltoid region.  No masses palpated. Normal shoulder motion pain with abduction. Strength intact abduction external/internal rotation. Positive Hawkins and Neer's test mildly. Negative empty can test. Negative  Yergason's and speeds test.  L-spine: Nontender midline.  Not particular tender perispinal musculature.  Decreased lumbar motion to rotation and lateral flexion bilaterally.  Lab and Radiology Results Diagnostic Limited MSK Ultrasound of: Right shoulder Deltoid area of pain from vaccine normal-appearing ultrasound examination. Biceps tendon normal-appearing Subscapularis tendon intact normal. Supraspinatus tendon is intact.  Moderate subacromial bursitis pattern present. Infraspinatus tendon normal. AC joint narrowed degenerative with effusion Impression: Subacromial bursitis and AC DJD   Impression and Recommendations:    Assessment and Plan: 69 y.o. male with right shoulder pain for 1 month following influenza vaccine.  Patient does have some soreness in the deltoid region near where he would have received his vaccine but this area is normal to ultrasound examination.  Dominant finding on shoulder exam is subacromial bursitis.  After discussion of treatment and plan and options plan for home exercise program as taught in clinic today by ATC, physical therapy, and short course of oral prednisone.  Patient would like to avoid injection which I think is reasonable.  Recheck as needed.  Right low back pain: Again ongoing for about a month.  Pain thought to be due to lumbosacral spasm and dysfunction.  Plan for home exercise program, physical therapy, and limited prednisone.Marland Kitchen  PDMP not reviewed this encounter. Orders Placed This Encounter  Procedures  . [place] LIMITED JOINT SPACE STRUCTURES UP RIGHT(NO LINKED CHARGES)    Standing Status:   Future    Number of Occurrences:   1    Standing Expiration Date:   09/01/2020    Order Specific Question:   Reason for Exam (SYMPTOM  OR DIAGNOSIS REQUIRED)    Answer:   right shoulder  pain    Order Specific Question:   Preferred imaging location?    Answer:   [facility]  . Ambulatory referral to Physical Therapy    Referral  Priority:   Routine    Referral Type:   Physical Medicine    Referral Reason:   Specialty Services Required    Requested Specialty:   Physical Therapy   Meds ordered this encounter  Medications  . predniSONE (DELTASONE) 10 MG tablet    Sig: Take 3 tablets (30 mg total) by mouth daily with breakfast.    Dispense:  15 tablet    Refill:  0    Discussed warning signs or symptoms. Please see discharge instructions. Patient expresses understanding.   The above documentation has been reviewed and is accurate and complete Daniel Carey, M.D.

## 2020-03-04 NOTE — Patient Instructions (Addendum)
Thank you for coming in today.  Plan for PT.  View at [email or web address] using code: [id number] Use heating pad and TENS unit.   Try the prednisone.   Let me know if this is not working.

## 2020-03-13 ENCOUNTER — Other Ambulatory Visit: Payer: Self-pay

## 2020-03-13 ENCOUNTER — Ambulatory Visit (INDEPENDENT_AMBULATORY_CARE_PROVIDER_SITE_OTHER): Payer: BC Managed Care – PPO | Admitting: Cardiology

## 2020-03-13 VITALS — BP 140/82 | HR 65 | Ht 70.0 in | Wt 194.2 lb

## 2020-03-13 DIAGNOSIS — I10 Essential (primary) hypertension: Secondary | ICD-10-CM

## 2020-03-13 DIAGNOSIS — G4733 Obstructive sleep apnea (adult) (pediatric): Secondary | ICD-10-CM

## 2020-03-13 NOTE — Patient Instructions (Signed)

## 2020-03-13 NOTE — Progress Notes (Signed)
Cardiology Office Note    Date:  03/13/2020   ID:  Daniel Carey, DOB 12/25/1950, MRN [id number]  PCP:  Vivi Barrack, MD  Cardiologist:  Fransico Him, MD   Chief Complaint  Patient presents with  . Sleep Apnea  . Hypertension    History of Present Illness:  Daniel Carey is a 69 y.o. male who presents for followup of sleep apnea. He has a history of snoring and excessive daytime fatigue but with an Epworth sleepiness scale of only 5. He underwent PSG showing severe OSA with an AHI of 51 events per hour, most occurring in the supine position in NREM sleep. He oxygen saturations dropped to 89% with respiratory events. He underwent CPAP titration to 10cm H2O.  He is doing well with his CPAP device and thinks that he has gotten used to it.  He tolerates the mask and feels the pressure is adequate.  Since going on CPAP he feels rested in the am and has no significant daytime sleepiness.  He denies any significant mouth or nasal dryness or nasal congestion.  He does not think that he snores.    Past Medical History:  Diagnosis Date  . 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 CAD (coronary artery disease)    moderate to severe distal CAD with large epicardial arteries having only mild to moderate irregularities. Last cath in Dec 2006; s/p cath with Promus DES in distal RCA and Promus DES in the mid vessel in an overlapping fashion  . Chest pain   . Complication of anesthesia    heart rate dropping with propofol last procedure  . COPD (chronic obstructive pulmonary disease) ([place])   . ED (erectile dysfunction)   . Esophageal stricture   . External hemorrhoids without mention of complication   . GERD (gastroesophageal reflux disease)   . Headache(784.0)   . Hiatal hernia   . History of kidney stones   . HLD (hyperlipidemia)   . HTN (hypertension)   . Myocardial infarction ([place])    1996  . Personal history of colonic polyps 03/12/2010   TUBULAR ADENOMA  .  Tobacco abuse     Past Surgical History:  Procedure Laterality Date  . ANGIOPLASTY     X3  . COLONOSCOPY N/A 02/16/2017   Procedure: COLONOSCOPY;  Surgeon: Jerene Bears, MD;  Location: [facility];  Service: Gastroenterology;  Laterality: N/A;  . INSERT / REPLACE / REMOVE PACEMAKER  06/20/2018  . LEFT HEART CATH AND CORONARY ANGIOGRAPHY N/A 11/05/2016   Procedure: Left Heart Cath and Coronary Angiography;  Surgeon: Nelva Bush, MD;  Location: [place] [facility];  Service: Cardiovascular;  Laterality: N/A;  . LUMBAR LAMINECTOMY/DECOMPRESSION MICRODISCECTOMY N/A 05/12/2018   Procedure: Microlumbar decompression L2-3, possible L1-2;  Surgeon: Susa Day, MD;  Location: [place];  Service: Orthopedics;  Laterality: N/A;  2 hrs, Microlumbar decompression L2-3, possible L1-2  . PACEMAKER IMPLANT N/A 06/20/2018   Procedure: PACEMAKER IMPLANT;  Surgeon: Evans Lance, MD;  Location: [place] [facility];  Service: Cardiovascular;  Laterality: N/A;  . PERCUTANEOUS CORONARY ROTOBLATOR INTERVENTION (PCI-R) N/A 06/30/2012   Procedure: PERCUTANEOUS CORONARY ROTOBLATOR INTERVENTION (PCI-R);  Surgeon: Burnell Blanks, MD;  Location: [facility] LAB;  Service: Cardiovascular;  Laterality: N/A;  . VIDEO BRONCHOSCOPY  08/13/2011   Procedure: VIDEO BRONCHOSCOPY WITHOUT FLUORO;  Surgeon: Tanda Rockers, MD;  Location: [facility];  Service: Cardiopulmonary;  Laterality: Bilateral;    Current Medications:  Outpatient Medications Prior to Visit  Medication Sig Dispense Refill  . acetaminophen (TYLENOL) 500 MG tablet Take 1,000 mg by mouth every 6 (six) hours as needed (pain).     Marland Kitchen albuterol (VENTOLIN HFA) 108 (90 Base) MCG/ACT inhaler INHALE 2 PUFFS INTO THE LUNGS EVERY 6 HOURS AS NEEDED FOR WHEEZE OR SHORTNESS OF BREATH 8 g 3  . aspirin EC 81 MG tablet Take 1 tablet (81 mg total) by mouth every evening. Resume 4 days post-op    . Choline Fenofibrate (FENOFIBRIC ACID) 135 MG CPDR TAKE 1 CAPSULE BY MOUTH  EVERY DAY 30 capsule 11  . famotidine (PEPCID) 20 MG tablet Take 20 mg by mouth daily as needed for heartburn.    . hydrochlorothiazide (HYDRODIURIL) 25 MG tablet TAKE 1/2 TABLET BY MOUTH EVERY DAY 15 tablet 11  . ipratropium (ATROVENT) 0.06 % nasal spray SPRAY 2 SPRAYS NASALLY DAILY AS NEEDED FOR RUNNY NOSE (Patient not taking: Reported on 03/01/2020) 15 mL 5  . isosorbide mononitrate (IMDUR) 60 MG 24 hr tablet Take 1 tablet (60 mg total) by mouth daily. 90 tablet 3  . metoprolol tartrate (LOPRESSOR) 25 MG tablet Take 1 tablet (25 mg total) by mouth 2 (two) times daily. Take addition one tablet 1 hr prior to exercise 225 tablet 2  . nitroGLYCERIN (NITROSTAT) 0.4 MG SL tablet Place 1 tablet (0.4 mg total) under the tongue every 5 (five) minutes as needed for chest pain. 25 tablet 6  . polyethylene glycol (MIRALAX / GLYCOLAX) packet Take 17 g by mouth daily as needed. 14 each 0  . predniSONE (DELTASONE) 10 MG tablet Take 3 tablets (30 mg total) by mouth daily with breakfast. (Patient not taking: Reported on 03/13/2020) 15 tablet 0  . RABEprazole (ACIPHEX) 20 MG tablet TAKE 1 TABLET (20 MG TOTAL) BY MOUTH DAILY AT 3 PM. 30 tablet 11  . rosuvastatin (CRESTOR) 20 MG tablet TAKE 1 TABLET BY MOUTH EVERY DAY 30 tablet 11  . Azelastine HCl 0.15 % SOLN Place 2 sprays into both nostrils 2 (two) times daily as needed. (Patient not taking: Reported on 03/13/2020)     No facility-administered medications prior to visit.     Allergies:   Amlodipine besylate, Propofol, Lisinopril, Rosuvastatin, and Metoprolol   Social History   Socioeconomic History  . Marital status: Married    Spouse name: Not on file  . Number of children: 1  . Years of education: Not on file  . Highest education level: Not on file  Occupational History  . Occupation: Radio broadcast assistant  Tobacco Use  . Smoking status: Former Smoker    Packs/day: 0.50    Years: 40.00    Pack years: 20.00    Types: Cigarettes    Quit date:  08/25/2011    Years since quitting: 8.5  . Smokeless tobacco: Never Used  Vaping Use  . Vaping Use: Never used  Substance and Sexual Activity  . Alcohol use: Yes    Comment: rare  . Drug use: No  . Sexual activity: Not Currently  Other Topics Concern  . Not on file  Social History Narrative  . Not on file   Social Determinants of Health   Financial Resource Strain:   . Difficulty of Paying Living Expenses: Not on file  Food Insecurity:   . Worried About Charity fundraiser in the Last Year: Not on file  . Ran Out of Food in the Last Year: Not on file  Transportation Needs:   .  Lack of Transportation (Medical): Not on file  . Lack of Transportation (Non-Medical): Not on file  Physical Activity:   . Days of Exercise per Week: Not on file  . Minutes of Exercise per Session: Not on file  Stress:   . Feeling of Stress : Not on file  Social Connections:   . Frequency of Communication with Friends and Family: Not on file  . Frequency of Social Gatherings with Friends and Family: Not on file  . Attends Religious Services: Not on file  . Active Member of Clubs or Organizations: Not on file  . Attends Archivist Meetings: Not on file  . Marital Status: Not on file     Family History:  The patient's family history includes Breast cancer in his sister; Diabetes in his mother; Heart disease in his father; Kidney failure in his mother.   ROS:   Please see the history of present illness.    ROS All other systems reviewed and are negative.   PHYSICAL EXAM:   VS:  BP 140/82   Pulse 65   Ht 5\' 10"  (1.778 m)   Wt 194 lb 3.2 oz (88.1 kg)   SpO2 98%   BMI 27.86 kg/m    GEN: Well nourished, well developed in no acute distress HEENT: Normal NECK: No JVD; No carotid bruits LYMPHATICS: No lymphadenopathy CARDIAC:RRR, no murmurs, rubs, gallops RESPIRATORY:  Clear to auscultation without rales, wheezing or rhonchi  ABDOMEN: Soft, non-tender, non-distended MUSCULOSKELETAL:   No edema; No deformity  SKIN: Warm and dry NEUROLOGIC:  Alert and oriented x 3 PSYCHIATRIC:  Normal affect    Wt Readings from Last 3 Encounters:  03/13/20 194 lb 3.2 oz (88.1 kg)  03/04/20 195 lb (88.5 kg)  03/01/20 193 lb (87.5 kg)      Studies/Labs Reviewed:   EKG:  EKG was not ordered today.    Recent Labs: 08/22/2019: TSH 3.83 12/22/2019: ALT 17; BUN 16; Creatinine, Ser 1.06; Potassium 4.6; Sodium 139   Lipid Panel    Component Value Date/Time   CHOL 163 12/22/2019 1555   TRIG 251 (H) 12/22/2019 1555   HDL 48 12/22/2019 1555   CHOLHDL 3.4 12/22/2019 1555   CHOLHDL 3.1 01/09/2016 1127   VLDL 26 01/09/2016 1127   LDLCALC 74 12/22/2019 1555   LDLDIRECT 107.0 02/27/2013 0904    Additional studies/ records that were reviewed today include:  CPAP download    ASSESSMENT:    1. OSA (obstructive sleep apnea)   2. Essential hypertension      PLAN:  In order of problems listed above:  OSA -  The patient is tolerating PAP therapy well without any problems. The PAP download was reviewed today and showed an AHI of 2.7/hr on 10 cm H2O with 70% compliance in using more than 4 hours nightly.  The patient has been using and benefiting from PAP use and will continue to benefit from therapy.   2.  HTN  -BP is well controlled on exam -continue Lopressor 25mg  BID and HCTZ 12.5mg  daily  Followup with me in 1 year  Medication Adjustments/Labs and Tests Ordered: Current medicines are reviewed at length with the patient today.  Concerns regarding medicines are outlined above.  Medication changes, Labs and Tests ordered today are listed in the Patient Instructions below. There are no Patient Instructions on file for this visit.   Signed, Fransico Him, MD  03/13/2020 2:24 PM    [place],  [place]  [address] Phone: [phone]; Fax: [phone]

## 2020-03-20 ENCOUNTER — Ambulatory Visit (INDEPENDENT_AMBULATORY_CARE_PROVIDER_SITE_OTHER): Payer: BC Managed Care – PPO | Admitting: Internal Medicine

## 2020-03-20 ENCOUNTER — Encounter: Payer: Self-pay | Admitting: Internal Medicine

## 2020-03-20 ENCOUNTER — Other Ambulatory Visit: Payer: Self-pay

## 2020-03-20 VITALS — BP 130/90 | HR 63 | Ht 71.0 in | Wt 191.0 lb

## 2020-03-20 DIAGNOSIS — E1165 Type 2 diabetes mellitus with hyperglycemia: Secondary | ICD-10-CM | POA: Diagnosis not present

## 2020-03-20 DIAGNOSIS — E663 Overweight: Secondary | ICD-10-CM | POA: Diagnosis not present

## 2020-03-20 DIAGNOSIS — E039 Hypothyroidism, unspecified: Secondary | ICD-10-CM | POA: Diagnosis not present

## 2020-03-20 LAB — POCT GLYCOSYLATED HEMOGLOBIN (HGB A1C): Hemoglobin A1C: 6.5 % — AB (ref 4.0–5.6)

## 2020-03-20 LAB — T4, FREE: Free T4: 0.75 ng/dL (ref 0.60–1.60)

## 2020-03-20 LAB — T3, FREE: T3, Free: 3 pg/mL (ref 2.3–4.2)

## 2020-03-20 LAB — TSH: TSH: 3.96 u[IU]/mL (ref 0.35–4.50)

## 2020-03-20 NOTE — Patient Instructions (Addendum)
Check sugars daily, rotating check times.  Please stop at the lab.  Please come back for a follow-up appointment in 6 months.

## 2020-03-20 NOTE — Progress Notes (Signed)
Patient ID: Daniel Carey, male   DOB: [birth year], 69 y.o.   MRN: [id number]  This visit occurred during the SARS-CoV-2 public health emergency.  Safety protocols were in place, including screening questions prior to the visit, additional usage of staff PPE, and extensive cleaning of exam room while observing appropriate contact time as indicated for disinfecting solutions.   HPI  Daniel Carey is a 69 y.o.-year-old male, returning for f/u for hypothyroidism, dx 2012 and DM2, diet-controlled, non-insulin-dependent, with complications (CAD). Last visit 2 years and 1 month ago.  Pt. has been dx with hypothyroidism in 2012>> he was on levothyroxine up to 200 mcg in 2016 >> but did not feel well so he stopped.  Afterwards, his TFTs fluctuated but in the last 3 years they have been normal.  We are following him without medication.  Reviewed previous TFTs: Lab Results  Component Value Date   TSH 3.83 08/22/2019   TSH 3.99 01/31/2018   TSH 4.50 08/02/2017   TSH 4.89 (H) 12/08/2016   TSH 4.13 03/06/2016   TSH 9.84 (H) 09/05/2015   TSH 0.23 (L) 05/07/2015   TSH 2.49 08/02/2014   TSH 4.695 (H) 10/26/2013   TSH 1.56 06/08/2012   FREET4 0.80 01/31/2018   FREET4 0.65 08/02/2017   FREET4 0.83 12/08/2016   FREET4 0.67 03/06/2016   FREET4 0.66 09/05/2015   FREET4 1.25 05/07/2015   FREET4 0.79 08/02/2014    Lab Results  Component Value Date   T3FREE 2.8 01/31/2018   T3FREE 2.6 08/02/2017   T3FREE 3.3 12/08/2016   T3FREE 3.1 03/06/2016   T3FREE 3.2 09/05/2015   T3FREE 3.9 05/07/2015   T3FREE 2.9 08/02/2014   T3FREE 2.8 07/20/2011   Pt denies: - feeling nodules in neck - hoarseness - dysphagia - choking - SOB with lying down  He sees Dr Acie Fredrickson >> had angioplasy 3 times in the past, 2 stents in last year. He had an AMI 1996 (mild). He has a Mobitz 1 AVB. Had a catheterization in 10/2016 >> partial blockages.    DM2:  Reviewed HbA1c levels: Lab Results  Component Value Date    HGBA1C 7.5 (H) 08/22/2019   HGBA1C 6.4 08/02/2017   HGBA1C 6.3 12/08/2016  07/03/2016: HbA1c 6.8%  He is not on diabetic medications.  He eliminated sweets and fried foods in the last 3 months.  He checks his sugars seldom: - am: 95-168 - 2h after b'fast: n/c - lunch: 136 - 2h after lunch: 100 - dinner: 120, 121 - 2h after dinner: n/c - bedtime: n/c  Meter: ReliOn  No history of CKD. Last BUN/Cr: Lab Results  Component Value Date   BUN 16 12/22/2019   BUN 15 06/22/2019   BUN 16 12/12/2018   CREATININE 1.06 12/22/2019   CREATININE 1.18 06/22/2019   CREATININE 1.12 12/12/2018  Is off ACE inhibitor/ARB due to history of high potassium.  Latest potassium levels have been normal: Lab Results  Component Value Date   K 4.6 12/22/2019   K 5.1 06/22/2019   K 4.7 12/12/2018   K 4.5 06/15/2018   K 4.9 05/13/2018   + HL: Lab Results  Component Value Date   CHOL 163 12/22/2019   HDL 48 12/22/2019   LDLCALC 74 12/22/2019   LDLDIRECT 107.0 02/27/2013   TRIG 251 (H) 12/22/2019   CHOLHDL 3.4 12/22/2019  On Crestor, Niacin, and fenofibrate.  Last eye exam in  2021: reportedly; No DR, no glaucoma, + incipient cataracts.  He stays active playing  golf.  He has OSA and wears a CPAP  He had a colonoscopy 02/2017 >> E coli sepsis.  ROS: Constitutional: no weight gain/no weight loss, no fatigue, no subjective hyperthermia, no subjective hypothermia Eyes: no blurry vision, no xerophthalmia ENT: no sore throat, no nodules palpated in neck, no dysphagia, no odynophagia, no hoarseness Cardiovascular: no CP/no SOB/no palpitations/no leg swelling Respiratory: no cough/no SOB/no wheezing Gastrointestinal: no N/no V/no D/no C/no acid reflux Musculoskeletal: no muscle aches/no joint aches Skin: no rashes, no hair loss Neurological: no tremors/no numbness/no tingling/no dizziness  I reviewed pt's medications, allergies, PMH, social hx, family hx, and changes were documented in the  history of present illness. Otherwise, unchanged from my initial visit note.  Past Medical History:  Diagnosis Date  . 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 CAD (coronary artery disease)    moderate to severe distal CAD with large epicardial arteries having only mild to moderate irregularities. Last cath in Dec 2006; s/p cath with Promus DES in distal RCA and Promus DES in the mid vessel in an overlapping fashion  . Chest pain   . Complication of anesthesia    heart rate dropping with propofol last procedure  . COPD (chronic obstructive pulmonary disease) ([place])   . ED (erectile dysfunction)   . Esophageal stricture   . External hemorrhoids without mention of complication   . GERD (gastroesophageal reflux disease)   . Headache(784.0)   . Hiatal hernia   . History of kidney stones   . HLD (hyperlipidemia)   . HTN (hypertension)   . Myocardial infarction ([place])    1996  . Personal history of colonic polyps 03/12/2010   TUBULAR ADENOMA  . Tobacco abuse    Past Surgical History:  Procedure Laterality Date  . ANGIOPLASTY     X3  . COLONOSCOPY N/A 02/16/2017   Procedure: COLONOSCOPY;  Surgeon: Jerene Bears, MD;  Location: [facility];  Service: Gastroenterology;  Laterality: N/A;  . INSERT / REPLACE / REMOVE PACEMAKER  06/20/2018  . LEFT HEART CATH AND CORONARY ANGIOGRAPHY N/A 11/05/2016   Procedure: Left Heart Cath and Coronary Angiography;  Surgeon: Nelva Bush, MD;  Location: [facility];  Service: Cardiovascular;  Laterality: N/A;  . LUMBAR LAMINECTOMY/DECOMPRESSION MICRODISCECTOMY N/A 05/12/2018   Procedure: Microlumbar decompression L2-3, possible L1-2;  Surgeon: Susa Day, MD;  Location: [facility];  Service: Orthopedics;  Laterality: N/A;  2 hrs, Microlumbar decompression L2-3, possible L1-2  . PACEMAKER IMPLANT N/A 06/20/2018   Procedure: PACEMAKER IMPLANT;  Surgeon: Evans Lance, MD;  Location: [facility];  Service: Cardiovascular;  Laterality:  N/A;  . PERCUTANEOUS CORONARY ROTOBLATOR INTERVENTION (PCI-R) N/A 06/30/2012   Procedure: PERCUTANEOUS CORONARY ROTOBLATOR INTERVENTION (PCI-R);  Surgeon: Burnell Blanks, MD;  Location: [facility];  Service: Cardiovascular;  Laterality: N/A;  . VIDEO BRONCHOSCOPY  08/13/2011   Procedure: VIDEO BRONCHOSCOPY WITHOUT FLUORO;  Surgeon: Tanda Rockers, MD;  Location: [facility];  Service: Cardiopulmonary;  Laterality: Bilateral;   History   Social History  . 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 Smoking status: Former Smoker -- 0.50 packs/day for 40 years    Types: Cigarettes    Quit date: 08/25/2011  . Smokeless tobacco: Never Used  . Alcohol Use: 0.0 oz/week     Comment:  rare  . Drug Use: No   Current Outpatient Medications on File Prior to Visit  Medication Sig Dispense Refill  . acetaminophen (TYLENOL) 500 MG tablet Take 1,000 mg by mouth every 6 (six) hours as needed (pain).     Marland Kitchen albuterol (VENTOLIN HFA) 108 (90 Base) MCG/ACT inhaler INHALE 2 PUFFS INTO THE LUNGS EVERY 6 HOURS AS NEEDED FOR WHEEZE OR SHORTNESS OF BREATH 8 g 3  . aspirin EC 81 MG tablet Take 1 tablet (81 mg total) by mouth every evening. Resume 4 days post-op    . Choline Fenofibrate (FENOFIBRIC ACID) 135 MG CPDR TAKE 1 CAPSULE BY MOUTH EVERY DAY 30 capsule 11  . famotidine (PEPCID) 20 MG tablet Take 20 mg by mouth daily as needed for heartburn.    . hydrochlorothiazide (HYDRODIURIL) 25 MG tablet TAKE 1/2 TABLET BY MOUTH EVERY DAY 15 tablet 11  . ipratropium (ATROVENT) 0.06 % nasal spray SPRAY 2 SPRAYS NASALLY DAILY AS NEEDED FOR RUNNY NOSE (Patient not taking: Reported on 03/01/2020) 15 mL 5  . isosorbide mononitrate (IMDUR) 60 MG 24 hr tablet Take 1 tablet (60 mg total) by mouth daily. 90 tablet 3  . metoprolol tartrate (LOPRESSOR) 25 MG tablet Take 1 tablet (25 mg total) by mouth 2 (two) times  daily. Take addition one tablet 1 hr prior to exercise 225 tablet 2  . nitroGLYCERIN (NITROSTAT) 0.4 MG SL tablet Place 1 tablet (0.4 mg total) under the tongue every 5 (five) minutes as needed for chest pain. 25 tablet 6  . polyethylene glycol (MIRALAX / GLYCOLAX) packet Take 17 g by mouth daily as needed. 14 each 0  . predniSONE (DELTASONE) 10 MG tablet Take 3 tablets (30 mg total) by mouth daily with breakfast. (Patient not taking: Reported on 03/13/2020) 15 tablet 0  . RABEprazole (ACIPHEX) 20 MG tablet TAKE 1 TABLET (20 MG TOTAL) BY MOUTH DAILY AT 3 PM. 30 tablet 11  . rosuvastatin (CRESTOR) 20 MG tablet TAKE 1 TABLET BY MOUTH EVERY DAY 30 tablet 11   No current facility-administered medications on file prior to visit.   Allergies  Allergen Reactions  . Amlodipine Besylate Other (See Comments)    Headache, weakness, dizziness  . Propofol Other (See Comments)    Heart rate dropped    . Lisinopril Cough and Rash  . Rosuvastatin Rash and Other (See Comments)     muscle aches   . Metoprolol     Lowered heart rate too much    Family History  Problem Relation Age of Onset  . Heart disease Father        hx of MI  . Diabetes Mother        and brother   . Kidney failure Mother   . Breast cancer Sister   . Colon cancer Neg Hx   . Stomach cancer Neg Hx    PE: BP 130/90   Pulse 63   Ht 5\' 11"  (1.803 m)   Wt 191 lb (86.6 kg)   SpO2 97%   BMI 26.64 kg/m  Wt Readings from Last 3 Encounters:  03/20/20 191 lb (86.6 kg)  03/13/20 194 lb 3.2 oz (88.1 kg)  03/04/20 195 lb (88.5 kg)   Constitutional: Slightly overweight, in NAD Eyes: PERRLA, EOMI, no exophthalmos ENT: moist mucous membranes, no thyromegaly, no cervical lymphadenopathy Cardiovascular: RRR, No MRG Respiratory: CTA B Gastrointestinal: abdomen soft, NT, ND, BS+ Musculoskeletal: no deformities, strength intact in all 4 Skin: moist, warm, no rashes Neurological: no tremor with  outstretched hands, DTR normal in all  4  ASSESSMENT: 1. Mild hypothyroidism  2. DM2, diet- controlled - + CAD  3.  Overweight  PLAN:  1. Patient with history of mild hypothyroidism, not on levothyroxine - latest thyroid labs reviewed with pt >> normal 6 months ago: Lab Results  Component Value Date   TSH 3.83 08/22/2019  - pt does not complain of any hypothyroid symptoms - will check thyroid tests today: TSH, free T4, free T3 - we discussed that if we need to start levothyroxine, he needs to take the thyroid hormone every day, with water, >30 minutes before breakfast, separated by >4 hours from acid reflux medications, calcium, iron, multivitamins.  2. DM2 -At last visit HbA1c was 6.1%, excellent.  However, he now returns after more than 2 years absence and his latest HbA1c from 6 months ago was 7.5%, higher - At last visit we discussed about starting antidiabetic medications and he was very reticent to restart Metformin at that time.  When his HbA1c returned 6 months ago, PCP suggested Metformin, but he again refused, wanting to change his diet.  He eliminated fatty foods and sweets in the last 3 months. - we checked his HbA1c: 6.5% (improved) - advised to check sugars at different times of the day - 1x a day, rotating check times - advised for yearly eye exams >> he is UTD - return to clinic in 6 months  3.  Overweight -At last visit, I recommended a low-fat plant-based diet and gave him materials. -As of now, he plans to continue to limit sweets and eliminate fried foods -He lost several pounds before last visit -At this visit, weight is stable  Component     Latest Ref Rng & Units 03/20/2020  T4,Free(Direct)     0.60 - 1.60 ng/dL 0.75  Triiodothyronine,Free,Serum     2.3 - 4.2 pg/mL 3.0  TSH     0.35 - 4.50 uIU/mL 3.96  TFTs are normal.  Philemon Kingdom, MD PhD [facility] Endocrinology

## 2020-03-21 ENCOUNTER — Encounter: Payer: Self-pay | Admitting: Internal Medicine

## 2020-03-22 ENCOUNTER — Telehealth: Payer: Self-pay | Admitting: *Deleted

## 2020-03-22 DIAGNOSIS — G4733 Obstructive sleep apnea (adult) (pediatric): Secondary | ICD-10-CM

## 2020-03-22 NOTE — Telephone Encounter (Signed)
Patient called in for cpap supplies order was placed to [facility]. Patient changed dme's they were not satisfied with [facility].

## 2020-03-25 ENCOUNTER — Ambulatory Visit (INDEPENDENT_AMBULATORY_CARE_PROVIDER_SITE_OTHER): Payer: BC Managed Care – PPO

## 2020-03-25 DIAGNOSIS — I441 Atrioventricular block, second degree: Secondary | ICD-10-CM

## 2020-03-27 LAB — CUP PACEART REMOTE DEVICE CHECK
Battery Remaining Longevity: 110 mo
Battery Remaining Percentage: 95.5 %
Battery Voltage: 3.01 V
Brady Statistic AP VP Percent: 8 %
Brady Statistic AP VS Percent: 1 %
Brady Statistic AS VP Percent: 92 %
Brady Statistic AS VS Percent: 1 %
Brady Statistic RA Percent Paced: 7.7 %
Brady Statistic RV Percent Paced: 99 %
Date Time Interrogation Session: 20211207020015
Implantable Lead Implant Date: 20200302
Implantable Lead Implant Date: 20200302
Implantable Lead Location: 753859
Implantable Lead Location: 753860
Implantable Lead Model: 3830
Implantable Pulse Generator Implant Date: 20200302
Lead Channel Impedance Value: 510 Ohm
Lead Channel Impedance Value: 580 Ohm
Lead Channel Pacing Threshold Amplitude: 0.75 V
Lead Channel Pacing Threshold Amplitude: 1 V
Lead Channel Pacing Threshold Pulse Width: 0.5 ms
Lead Channel Pacing Threshold Pulse Width: 0.5 ms
Lead Channel Sensing Intrinsic Amplitude: 4.4 mV
Lead Channel Sensing Intrinsic Amplitude: 9 mV
Lead Channel Setting Pacing Amplitude: 2 V
Lead Channel Setting Pacing Amplitude: 2.5 V
Lead Channel Setting Pacing Pulse Width: 0.5 ms
Lead Channel Setting Sensing Sensitivity: 2 mV
Pulse Gen Model: 2272
Pulse Gen Serial Number: 9114136

## 2020-04-03 NOTE — Progress Notes (Signed)
Remote pacemaker transmission.   

## 2020-04-04 DIAGNOSIS — Z23 Encounter for immunization: Secondary | ICD-10-CM | POA: Diagnosis not present

## 2020-04-24 ENCOUNTER — Other Ambulatory Visit: Payer: Self-pay | Admitting: Family Medicine

## 2020-04-24 ENCOUNTER — Encounter: Payer: Self-pay | Admitting: *Deleted

## 2020-04-24 NOTE — Telephone Encounter (Signed)
He should check with insurance to see what is covered.  Katina Degree. Jimmey Ralph, MD 04/24/2020 11:19 AM

## 2020-04-24 NOTE — Telephone Encounter (Signed)
Rx not longer cover by insurance Please advise

## 2020-04-26 ENCOUNTER — Encounter: Payer: Self-pay | Admitting: Family Medicine

## 2020-05-04 ENCOUNTER — Telehealth: Payer: Self-pay | Admitting: *Deleted

## 2020-05-04 NOTE — Telephone Encounter (Signed)
PA Rx Rabeprazole 20mg  was send today awaiting for determination

## 2020-05-07 NOTE — Telephone Encounter (Signed)
PA denied, nopatient does not have Rx benefits with [employer]

## 2020-05-08 ENCOUNTER — Other Ambulatory Visit: Payer: Self-pay | Admitting: *Deleted

## 2020-05-08 MED ORDER — PANTOPRAZOLE SODIUM 40 MG PO TBEC: 40.0000 mg | DELAYED_RELEASE_TABLET | Freq: Every day | ORAL | 3 refills | Status: DC

## 2020-05-08 NOTE — Telephone Encounter (Signed)
Send Rx to pharmacy

## 2020-05-08 NOTE — Telephone Encounter (Signed)
Patient requesting changes on Rx Aciphex  PA was done and was denied insurance stated pt has no coverage for medication.

## 2020-05-14 ENCOUNTER — Other Ambulatory Visit: Payer: Self-pay | Admitting: Family Medicine

## 2020-05-20 ENCOUNTER — Encounter: Payer: Self-pay | Admitting: Physician Assistant

## 2020-05-20 ENCOUNTER — Telehealth (INDEPENDENT_AMBULATORY_CARE_PROVIDER_SITE_OTHER): Payer: BC Managed Care – PPO | Admitting: Physician Assistant

## 2020-05-20 ENCOUNTER — Other Ambulatory Visit: Payer: Self-pay

## 2020-05-20 DIAGNOSIS — J0100 Acute maxillary sinusitis, unspecified: Secondary | ICD-10-CM | POA: Diagnosis not present

## 2020-05-20 MED ORDER — AZITHROMYCIN 250 MG PO TABS: ORAL_TABLET | ORAL | 0 refills | Status: DC

## 2020-05-20 MED ORDER — BENZONATATE 100 MG PO CAPS: 100.0000 mg | ORAL_CAPSULE | Freq: Three times a day (TID) | ORAL | 0 refills | Status: AC | PRN

## 2020-05-20 NOTE — Patient Instructions (Signed)
Please take the medications as directed. Call if worse or no improvement of symptoms.   

## 2020-05-20 NOTE — Progress Notes (Signed)
I have discussed the procedure for the virtual visit with the patient who has given consent to proceed with assessment and treatment.   Luvia Orzechowski S Zonie Crutcher, CMA     

## 2020-05-20 NOTE — Progress Notes (Signed)
Virtual Visit via Video Note  I connected with Daniel Carey on 05/20/20 at  1:00 PM EST by a video enabled telemedicine application and verified that I am speaking with the correct person using two identifiers.  Location: Patient: Home Provider: Office   I discussed the limitations of evaluation and management by telemedicine and the availability of in person appointments. The patient expressed understanding and agreed to proceed. Only the patient and myself were present for today's video call.   History of Present Illness:  Pt is a 70 yo male presenting for a video visit today for sinus congestion x 2 weeks. He has had congestion, coughing up phlegm and mucous, some sneezing, and headache every once in awhile. Mucinex and Robitussin has provided limited relief. States that he took a negative COVID test at home last week. No known sick contacts. Has had COVID and flu vaccines, including COVID booster. No fever or body aches. No loss of taste or smell. Says he usually gets this about once per year and Zpak takes care of it.    Observations/Objective:  BP 135/70 per patient SpO2 96% HR 68 bpm  Gen: Awake, alert, no acute distress Resp: Breathing is even and non-labored Psych: calm/pleasant demeanor Neuro: Alert and Oriented x 3, + facial symmetry, speech is clear.   Assessment and Plan:  Acute sinusitis - As this has been more than a 10 day timeframe, an antibiotic may be appropriate at this time. Sent Rx for Z-pak to his pharmacy as well as cough tablets for added relief. He will continue nasal saline and pushing fluids at home. A note work is provided as well.   Follow Up Instructions:    I discussed the assessment and treatment plan with the patient. The patient was provided an opportunity to ask questions and all were answered. The patient agreed with the plan and demonstrated an understanding of the instructions.   The patient was advised to call back or seek an in-person  evaluation if the symptoms worsen or if the condition fails to improve as anticipated.  Treveon Bourcier M Derak Schurman, PA-C

## 2020-05-21 ENCOUNTER — Ambulatory Visit: Payer: BC Managed Care – PPO | Admitting: Cardiovascular Disease

## 2020-05-22 ENCOUNTER — Telehealth: Payer: Self-pay

## 2020-05-22 NOTE — Telephone Encounter (Signed)
Pt.'s wife states she talked to the insurance and a prior auth needs to be done for RABEprazole (ACIPHEX) 20 MG tablet then the patient will be able to get it.    PA # [id number]

## 2020-05-24 NOTE — Telephone Encounter (Signed)
See below

## 2020-05-29 NOTE — Telephone Encounter (Signed)
See note

## 2020-06-03 ENCOUNTER — Encounter: Payer: Self-pay | Admitting: Internal Medicine

## 2020-06-03 NOTE — Telephone Encounter (Signed)
Have we seen the prior auth? Not else much I can do except send it in to the pharmacy.  Algis Greenhouse. Jerline Pain, MD 06/03/2020 2:54 PM

## 2020-06-03 NOTE — Telephone Encounter (Signed)
Please advise 

## 2020-06-05 NOTE — Telephone Encounter (Signed)
See below

## 2020-06-06 NOTE — Telephone Encounter (Signed)
See below, please look into this and f/u with pt.

## 2020-06-06 NOTE — Telephone Encounter (Signed)
Rx need to be send to pharmacy prior to start PA  Patient wife aware, stated other medication did not help

## 2020-06-06 NOTE — Telephone Encounter (Signed)
Patients wife is following up regarding medicatioin and prior [place]

## 2020-06-07 ENCOUNTER — Other Ambulatory Visit: Payer: Self-pay | Admitting: Family Medicine

## 2020-06-07 ENCOUNTER — Other Ambulatory Visit: Payer: Self-pay | Admitting: *Deleted

## 2020-06-07 MED ORDER — RABEPRAZOLE SODIUM 20 MG PO TBEC: 20.0000 mg | DELAYED_RELEASE_TABLET | Freq: Every day | ORAL | 11 refills | Status: DC

## 2020-06-11 ENCOUNTER — Telehealth: Payer: Self-pay | Admitting: Internal Medicine

## 2020-06-11 NOTE — Telephone Encounter (Signed)
Patients wife called to check when the patient is due for recall

## 2020-06-11 NOTE — Telephone Encounter (Signed)
PA send today  # [id number]

## 2020-06-12 NOTE — Telephone Encounter (Signed)
Left message for pt to call back  °

## 2020-06-12 NOTE — Telephone Encounter (Signed)
Pts wife states pt developed e coli in his bloodstream the last time he had a colon, reports this settled into his bladder and he had bad UTI's. Pt wants to see Dr. Hilarie Fredrickson and discuss this before setting up his next colon to make sure this does not happen. Also states he has had a pacemaker placed since the last colon and perhaps he could have the procedure in the [facility] instead of [facility]. Pt scheduled to see Dr. Hilarie Fredrickson 07/24/20 at 2:10pm. Wife aware of appt.

## 2020-06-13 NOTE — Telephone Encounter (Signed)
PA for Rx RABEprazole (ACIPHEX) was approved till 06/11/2021 Pharmacy and patient notified

## 2020-06-16 ENCOUNTER — Other Ambulatory Visit: Payer: Self-pay | Admitting: Cardiovascular Disease

## 2020-06-18 MED ORDER — METOPROLOL TARTRATE 25 MG PO TABS: 25.0000 mg | ORAL_TABLET | Freq: Two times a day (BID) | ORAL | 1 refills | Status: DC

## 2020-06-18 MED ORDER — ISOSORBIDE MONONITRATE ER 60 MG PO TB24: 60.0000 mg | ORAL_TABLET | Freq: Every day | ORAL | 1 refills | Status: DC

## 2020-06-24 ENCOUNTER — Ambulatory Visit (INDEPENDENT_AMBULATORY_CARE_PROVIDER_SITE_OTHER): Payer: BC Managed Care – PPO

## 2020-06-24 DIAGNOSIS — I441 Atrioventricular block, second degree: Secondary | ICD-10-CM

## 2020-06-25 LAB — CUP PACEART REMOTE DEVICE CHECK
Battery Remaining Longevity: 110 mo
Battery Remaining Percentage: 95.5 %
Battery Voltage: 3.01 V
Brady Statistic AP VP Percent: 8.4 %
Brady Statistic AP VS Percent: 1 %
Brady Statistic AS VP Percent: 91 %
Brady Statistic AS VS Percent: 1 %
Brady Statistic RA Percent Paced: 8.1 %
Brady Statistic RV Percent Paced: 99 %
Date Time Interrogation Session: 20220308020014
Implantable Lead Implant Date: 20200302
Implantable Lead Implant Date: 20200302
Implantable Lead Location: 753859
Implantable Lead Location: 753860
Implantable Lead Model: 3830
Implantable Pulse Generator Implant Date: 20200302
Lead Channel Impedance Value: 530 Ohm
Lead Channel Impedance Value: 580 Ohm
Lead Channel Pacing Threshold Amplitude: 0.75 V
Lead Channel Pacing Threshold Amplitude: 1 V
Lead Channel Pacing Threshold Pulse Width: 0.5 ms
Lead Channel Pacing Threshold Pulse Width: 0.5 ms
Lead Channel Sensing Intrinsic Amplitude: 3.8 mV
Lead Channel Sensing Intrinsic Amplitude: 9 mV
Lead Channel Setting Pacing Amplitude: 2 V
Lead Channel Setting Pacing Amplitude: 2.5 V
Lead Channel Setting Pacing Pulse Width: 0.5 ms
Lead Channel Setting Sensing Sensitivity: 2 mV
Pulse Gen Model: 2272
Pulse Gen Serial Number: 9114136

## 2020-07-02 ENCOUNTER — Telehealth: Payer: Self-pay | Admitting: Cardiology

## 2020-07-02 NOTE — Telephone Encounter (Signed)
Called and got it changed for her today. Pt is aware and agreeable to treatment.

## 2020-07-02 NOTE — Telephone Encounter (Signed)
Patient's wife called wanting to change pharmacy location to get cpap supply from [place] to [facility] in [place], [place]. Please send prescription and copy of patient insurance

## 2020-07-02 NOTE — Progress Notes (Signed)
Remote pacemaker transmission.   

## 2020-07-02 NOTE — Telephone Encounter (Signed)
Patients wife called in for cpap supplies order was placed to [facility]. Patient changed dme's they were not satisfied with [facility].

## 2020-07-05 ENCOUNTER — Other Ambulatory Visit: Payer: Self-pay

## 2020-07-05 ENCOUNTER — Ambulatory Visit (INDEPENDENT_AMBULATORY_CARE_PROVIDER_SITE_OTHER): Payer: BC Managed Care – PPO | Admitting: Cardiovascular Disease

## 2020-07-05 ENCOUNTER — Encounter: Payer: Self-pay | Admitting: Cardiovascular Disease

## 2020-07-05 VITALS — BP 132/60 | HR 66 | Ht 70.0 in | Wt 195.0 lb

## 2020-07-05 DIAGNOSIS — I25119 Atherosclerotic heart disease of native coronary artery with unspecified angina pectoris: Secondary | ICD-10-CM | POA: Diagnosis not present

## 2020-07-05 NOTE — Progress Notes (Signed)
[name] Date of Birth  July 18, 1950       [place] [address], [name], [name]  [address]   [place], [place]  [address] [phone]     [phone]   Fax  [phone]    Fax [phone]  Problem List: 1. CAD - 2.5 x 38 mm Promus Premier DES in the distal RCA and a 2.75 x 38 mm Promus Premier DES in the mid vessel in an overlapping fashion. The distal stent was post-dilated with a 2.5 x 20 mm [place] balloon. The proximal portion of the stent was post-dilated with a 3.0 x 20 mm [place] balloon ( June 30, 2012) current: The care  2. Bradycardia after getting propafol for endoscopy. 3. Obstructive sleep apnea:   :  Jamiah is feeling well today.  He's been getting some exercise. He does yard work and walks on the treadmill occasionally. He had an episode of bradycardia after getting IV protocol for an endoscopy procedure. He was seen by [name]. His metoprolol dose was decreased. He has also had a bronchoscopy with [name]. He does not have any evidence of COPD.  Both he and his wife have stop smoking.  ( Aug 20, 2011)  Feb. 28, 2014:  He continues to have increasing chest p pain Dr. Edwinna Areola ain.  He has not been smoking.  The pains are associated with exertion, last 5-10 minutes, resolve with 1 SL NTG.   He's had a mildly elevated TSH for the past year or so and has had some fatigue.    his TSH level 6.8.      His medical doctor started him on Synthroid. He gradually has titrated up to 0.1 mg a day. Since that time he's had increasing episodes of chest pain. All so the house that taxes at the same time present all is doing the same time in the summer and not a [facility] Aug 29, 2012: We will show  Adalberto Ill saw him in February we performed a cardiac catheterization because of increasing chest pain. He was found to have sequential stenosis in his mid right coronary artery and had PCI of his right coronary artery ( 2.5 x  38 mm Promus Premier DES in the distal RCA and a 2.75 x 38 mm Promus Premier DES in the mid vessel in an overlapping fashion. The distal sten was post-dilated with a 2.5 x 20 mm [place] balloon. The proximal portion of the stent was post-dilated with a 3.0 x 20 mm [place] balloon)  He and his wife are concerned about whether or not the Plavix is active.  His generic plavix manufacturer has changed.   He would like to be tested.   October 14, 2012:  [name] has been having some occasional CP.  He has had some episodes of dyspnea while climbing up a hill playing golf.  This past Monday night, he had some chest pain - possible due to indigestions.  these  He had lost his NTG - he has refilled it now.    Nov. 10, 2014:  Marlon is doing well.  He is working too much according to wife. he's had a few CP but not severe enough to take NTG.  Still has some dyspnea with climbing steps.   He had stenting in march 2014.  October 23, 2013:  Ajene is doing well.   Playing golf.  Working in the yard.  Not  exercising as much.   He has lost about 6 lbs.  He had labs drawn today.     Jan. 7, 2016:  He has been waking up in the middle of the night and feels like he cant catch his  Breath. Still working out - on the treadmill twice this week and has done well.   Sept. 1, 2016:  Has had some short of breath. Does not get any exercise.  His CP is much better since starting CPAP .  Is having bloating  - thinks its due to the lipitor   July 19, 2015: Has had some shortness of breath.   With exertion . Happens occasionally but seem to be getting more frequently .  BP is elevated today  Has cut back on his salt .   Sept. 21, 2017:  Nussen is seen today for follow up visit for his CAD Occasionally short of breath . Comes and goes .  BP is a little elevated today Has occasional CP  Able to do all that he wants to do   November 04, 2016: Still having some chest pain Not exertional ,  Seems very similar to his previous  episodes of angina prior to stenting .   July 31 , 2108  Leroy is seen today for follow up of a recent cath.  He was found to have diffuse moderate - severe CAD - especially in the LAD  Was started on Imdur - is having a headache with that. Angina seems to be slightly  better.  He still has marked DOE - wife is very concerned about this .   is going to see pulmonary soon .  Sept. 24, 2018:  [name] is doing very well. His chest pain seemed to be better. Getting some exercise   A little bit more salt than usual over the weekend. His diastolic blood pressure today is 90.  Nov. 8, 2018:    Coen is seen today  Has worsening dyspnea.   Chest tightness Also had a bladder infection  - burning with urination. ,   Fevers, chills.   Temp of 99.8 Still having fevers and chills  His dyspnea seems to be worse with his chills.  No blood in urine,   extremy dysuria and frequency + cough , no hemoptysis, No pleuretic CP  No real angina   April 08, 2017:  Lorenda Hatchet is seen back for follow up visit Has urosepsis when I last saw him ( UTI with bacteremia )  Treated with ABx Now is having dysuria .   Is seeing urologist tomorrow .   No serious angina .   December 23, 2017:  [name] is seen today for follow-up of his coronary artery disease. Had a colonoscopy  Complicated by a E.coli UTI when I last saw him  Rare episodes of CP   Sept. 2, 2020   Doing well Had a pacer placed a month ago  Feels beter Has some CP if he starts walks on the treadmill.   Is able to walk through it and usually does ok  Lasts for a minutes.  Last cath was July 2019.  Conclusions: 1. Multivessel coronary artery disease, including diffuse calcified LAD disease of up to 60-70% (FFR 0.73), moderate to severe LCx and OM3 disease (FFR 0.83 at submaximal hyperemia), and moderate in-stent restenosis of mid RCA. 2. Normal left ventricular filling pressure. 3. Normal left ventricular systolic  function.  Recommendations: 1. Escalate antianginal therapy with addition of isosorbide mononitrate.  2. Continue aggressive secondary prevention, including dual antiplatelet and statin therapy. 3. I will review the films with the intervention and cardiac surgery teams. Revascularization options include atherectomy and PCI to LAD (would require multiple stents) versus CABG.  He has found that if he takes an extra metoprolol about an hour before he exercises that his angina is much better controlled.  We will have him continue that dosing scheme.  June 22, 2019:  Occasional CP ,  Has some chest pressure while on the treadmill.   Rests for 5-10 min then the discomfort resolves Has not improved with takeing metoprolol before his workout.  He also notes he is having more shortness of breath.  Has cut his salt intake .  BP is elevated today .   Has not been elevated at home   His last heart catheterization was in 2018.  He was found to have diffuse coronary artery disease.  We increased medical therapy.  Tested + for Covid in January.   May still be recovering from that    Sept. 3, 2021: Seen for follow up of his CAD, HLD,  Pacer  S/p pacer for bradycardia  Still has some occasional episodes of CP when he is going to bed.  Atypical  No CP with activity  No pressure  Just pins and needles sensation    July 05, 2020 Hx of CAD, pacer, HLD Still having some CP on occasion .  Also has panic attacks  Has some numbness in his hands. Can walk on the treadmill.   Does ok as long as he has not eaten anything recently  Cp may be gradually getting worse.  Relieved with SL NTG  Is on metoprolol, imdur Is intol to amlodipine  Has these discomforts several times a week ,  He thinks is progressing   Current Outpatient Medications on File Prior to Visit  Medication Sig Dispense Refill  . acetaminophen (TYLENOL) 500 MG tablet Take 1,000 mg by mouth every 6 (six) hours as needed (pain).     Marland Kitchen  albuterol (VENTOLIN HFA) 108 (90 Base) MCG/ACT inhaler INHALE 2 PUFFS INTO THE LUNGS EVERY 6 HOURS AS NEEDED FOR WHEEZE OR SHORTNESS OF BREATH 18 each 3  . aspirin EC 81 MG tablet Take 1 tablet (81 mg total) by mouth every evening. Resume 4 days post-op    . Choline Fenofibrate (FENOFIBRIC ACID) 135 MG CPDR TAKE 1 CAPSULE BY MOUTH EVERY DAY 30 capsule 11  . famotidine (PEPCID) 20 MG tablet Take 20 mg by mouth daily as needed for heartburn.    . hydrochlorothiazide (HYDRODIURIL) 25 MG tablet TAKE 1/2 TABLET BY MOUTH EVERY DAY 15 tablet 11  . isosorbide mononitrate (IMDUR) 60 MG 24 hr tablet Take 1 tablet (60 mg total) by mouth daily. 90 tablet 1  . metoprolol tartrate (LOPRESSOR) 25 MG tablet Take 1 tablet (25 mg total) by mouth 2 (two) times daily. Take addition one tablet 1 hr prior to exercise 225 tablet 1  . nitroGLYCERIN (NITROSTAT) 0.4 MG SL tablet Place 1 tablet (0.4 mg total) under the tongue every 5 (five) minutes as needed for chest pain. 25 tablet 6  . polyethylene glycol (MIRALAX / GLYCOLAX) packet Take 17 g by mouth daily as needed. 14 each 0  . RABEprazole (ACIPHEX) 20 MG tablet Take 1 tablet (20 mg total) by mouth daily in the afternoon. 30 tablet 11  . rosuvastatin (CRESTOR) 20 MG tablet TAKE 1 TABLET BY MOUTH EVERY DAY 30 tablet 11  No current facility-administered medications on file prior to visit.    Allergies  Allergen Reactions  . Amlodipine Besylate Other (See Comments)    Headache, weakness, dizziness  . Propofol Other (See Comments)    Heart rate dropped    . Lisinopril Cough and Rash  . Rosuvastatin Rash and Other (See Comments)     muscle aches   . Metoprolol     Lowered heart rate too much     Past Medical History:  Diagnosis Date  . 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 CAD (coronary artery disease)    moderate to severe distal CAD with large epicardial arteries having only mild to moderate irregularities. Last cath in Dec 2006; s/p cath  with Promus DES in distal RCA and Promus DES in the mid vessel in an overlapping fashion  . Chest pain   . Complication of anesthesia    heart rate dropping with propofol last procedure  . COPD (chronic obstructive pulmonary disease) ([place])   . ED (erectile dysfunction)   . Esophageal stricture   . External hemorrhoids without mention of complication   . GERD (gastroesophageal reflux disease)   . Headache(784.0)   . Hiatal hernia   . History of kidney stones   . HLD (hyperlipidemia)   . HTN (hypertension)   . Myocardial infarction ([place])    1996  . Personal history of colonic polyps 03/12/2010   TUBULAR ADENOMA  . Tobacco abuse     Past Surgical History:  Procedure Laterality Date  . ANGIOPLASTY     X3  . COLONOSCOPY N/A 02/16/2017   Procedure: COLONOSCOPY;  Surgeon: Jerene Bears, MD;  Location: [facility];  Service: Gastroenterology;  Laterality: N/A;  . INSERT / REPLACE / REMOVE PACEMAKER  06/20/2018  . LEFT HEART CATH AND CORONARY ANGIOGRAPHY N/A 11/05/2016   Procedure: Left Heart Cath and Coronary Angiography;  Surgeon: Nelva Bush, MD;  Location: [facility];  Service: Cardiovascular;  Laterality: N/A;  . LUMBAR LAMINECTOMY/DECOMPRESSION MICRODISCECTOMY N/A 05/12/2018   Procedure: Microlumbar decompression L2-3, possible L1-2;  Surgeon: Susa Day, MD;  Location: [facility];  Service: Orthopedics;  Laterality: N/A;  2 hrs, Microlumbar decompression L2-3, possible L1-2  . PACEMAKER IMPLANT N/A 06/20/2018   Procedure: PACEMAKER IMPLANT;  Surgeon: Evans Lance, MD;  Location: [facility];  Service: Cardiovascular;  Laterality: N/A;  . PERCUTANEOUS CORONARY ROTOBLATOR INTERVENTION (PCI-R) N/A 06/30/2012   Procedure: PERCUTANEOUS CORONARY ROTOBLATOR INTERVENTION (PCI-R);  Surgeon: Burnell Blanks, MD;  Location: [facility] C[facility] LAB;  Service: Cardiovascular;  Laterality: N/A;  . VIDEO BRONCHOSCOPY  08/13/2011   Procedure: VIDEO BRONCHOSCOPY WITHOUT FLUORO;   Surgeon: Tanda Rockers, MD;  Location: [facility];  Service: Cardiopulmonary;  Laterality: Bilateral;    Social History   Tobacco Use  Smoking Status Former Smoker  . Packs/day: 0.50  . Years: 40.00  . Pack years: 20.00  . Types: Cigarettes  . Quit date: 08/25/2011  . Years since quitting: 8.8  Smokeless Tobacco Never Used    Social History   Substance and Sexual Activity  Alcohol Use Yes   Comment: rare    Family History  Problem Relation Age of Onset  . Heart disease Father        hx of MI  . Diabetes Mother        and brother   . Kidney failure Mother   . Breast cancer Sister   . Colon cancer Neg  Hx   . Stomach cancer Neg Hx     Reviw of Systems:  Reviewed in the HPI.  All other systems are negative.  Physical Exam: Blood pressure 132/60, pulse 66, height 5\' 10"  (1.778 m), weight 195 lb (88.5 kg), SpO2 96 %.  GEN:  Well nourished, well developed in no acute distress HEENT: Normal NECK: No JVD; No carotid bruits LYMPHATICS: No lymphadenopathy CARDIAC: RRR , no murmurs, rubs, gallops RESPIRATORY:  Clear to auscultation without rales, wheezing or rhonchi  ABDOMEN: Soft, non-tender, non-distended MUSCULOSKELETAL:  No edema; No deformity  SKIN: Warm and dry NEUROLOGIC:  Alert and oriented x 3    ECG:     July 05, 2020: Normal sinus rhythm at 63.  Ventricular pacing.  Assessment / Plan:   1. COVID :     Getting over covid   2. CAD  -    he is having progressive angina symptoms.  He has known moderate to severe coronary artery disease.  His last heart catheterization was 2018.  We have performed stress Myoview's in the past and they have not been very helpful. We will schedule him for left heart catheterization with possible PCI.  I told him that he may wind up needing bypass surgery because of the diffuseness of his coronary artery disease.  We discussed the risk, benefits, options.  He understands and agrees to proceed.  He will need Covid testing  beforehand     3. Hyperlipidemia:      Cont current meds.   Check labs today    4.  Obstructive sleep apnea:        6 months to see an APP   Mertie Moores, MD  07/05/2020 1:44 PM    [facility] [place],  [place], [place]  [address] Pager [phone] Phone: [phone]; Fax: [phone]

## 2020-07-05 NOTE — H&P (View-Only) (Signed)
[name] Date of Birth  July 18, 1950       [place] [address], [name], [name]  [address]   [place], [place]  [address] [phone]     [phone]   Fax  [phone]    Fax [phone]  Problem List: 1. CAD - 2.5 x 38 mm Promus Premier DES in the distal RCA and a 2.75 x 38 mm Promus Premier DES in the mid vessel in an overlapping fashion. The distal stent was post-dilated with a 2.5 x 20 mm [place] balloon. The proximal portion of the stent was post-dilated with a 3.0 x 20 mm [place] balloon ( June 30, 2012) current: The care  2. Bradycardia after getting propafol for endoscopy. 3. Obstructive sleep apnea:   :  Daniel Carey is feeling well today.  He's been getting some exercise. He does yard work and walks on the treadmill occasionally. He had an episode of bradycardia after getting IV protocol for an endoscopy procedure. He was seen by Daniel Carey. His metoprolol dose was decreased. He has also had a bronchoscopy with Daniel Carey. He does not have any evidence of COPD.  Both he and his wife have stop smoking.  ( Aug 20, 2011)  Feb. 28, 2014:  He continues to have increasing chest p pain Daniel Carey ain.  He has not been smoking.  The pains are associated with exertion, last 5-10 minutes, resolve with 1 SL NTG.   He's had a mildly elevated TSH for the past year or so and has had some fatigue.    his TSH level 6.8.      His medical doctor started him on Synthroid. He gradually has titrated up to 0.1 mg a day. Since that time he's had increasing episodes of chest pain. All so the house that taxes at the same time present all is doing the same time in the summer and not a [facility] Aug 29, 2012: We will show  Daniel Carey saw him in February we performed a cardiac catheterization because of increasing chest pain. He was found to have sequential stenosis in his mid right coronary artery and had PCI of his right coronary artery ( 2.5 x  38 mm Promus Premier DES in the distal RCA and a 2.75 x 38 mm Promus Premier DES in the mid vessel in an overlapping fashion. The distal sten was post-dilated with a 2.5 x 20 mm [place] balloon. The proximal portion of the stent was post-dilated with a 3.0 x 20 mm [place] balloon)  He and his wife are concerned about whether or not the Plavix is active.  His generic plavix manufacturer has changed.   He would like to be tested.   October 14, 2012:  Daniel Carey has been having some occasional CP.  He has had some episodes of dyspnea while climbing up a hill playing golf.  This past Monday night, he had some chest pain - possible due to indigestions.  these  He had lost his NTG - he has refilled it now.    Nov. 10, 2014:  Daniel Carey is doing well.  He is working too much according to wife. he's had a few CP but not severe enough to take NTG.  Still has some dyspnea with climbing steps.   He had stenting in march 2014.  October 23, 2013:  Daniel Carey is doing well.   Playing golf.  Working in the yard.  Not  exercising as much.   He has lost about 6 lbs.  He had labs drawn today.     Jan. 7, 2016:  He has been waking up in the middle of the night and feels like he cant catch his  Breath. Still working out - on the treadmill twice this week and has done well.   Sept. 1, 2016:  Has had some short of breath. Does not get any exercise.  His CP is much better since starting CPAP .  Is having bloating  - thinks its due to the lipitor   July 19, 2015: Has had some shortness of breath.   With exertion . Happens occasionally but seem to be getting more frequently .  BP is elevated today  Has cut back on his salt .   Sept. 21, 2017:  Daniel Carey is seen today for follow up visit for his CAD Occasionally short of breath . Comes and goes .  BP is a little elevated today Has occasional CP  Able to do all that he wants to do   November 04, 2016: Still having some chest pain Not exertional ,  Seems very similar to his previous  episodes of angina prior to stenting .   July 31 , 2108  Daniel Carey is seen today for follow up of a recent cath.  He was found to have diffuse moderate - severe CAD - especially in the LAD  Was started on Imdur - is having a headache with that. Angina seems to be slightly  better.  He still has marked DOE - wife is very concerned about this .   is going to see pulmonary soon .  Sept. 24, 2018:  Daniel Carey is doing very well. His chest pain seemed to be better. Getting some exercise   A little bit more salt than usual over the weekend. His diastolic blood pressure today is 90.  Nov. 8, 2018:    Daniel Carey is seen today  Has worsening dyspnea.   Chest tightness Also had a bladder infection  - burning with urination. ,   Fevers, chills.   Temp of 99.8 Still having fevers and chills  His dyspnea seems to be worse with his chills.  No blood in urine,   extremy dysuria and frequency + cough , no hemoptysis, No pleuretic CP  No real angina   April 08, 2017:  Daniel Carey is seen back for follow up visit Has urosepsis when I last saw him ( UTI with bacteremia )  Treated with ABx Now is having dysuria .   Is seeing urologist tomorrow .   No serious angina .   December 23, 2017:  Daniel Carey is seen today for follow-up of his coronary artery disease. Had a colonoscopy  Complicated by a E.coli UTI when I last saw him  Rare episodes of CP   Sept. 2, 2020   Doing well Had a pacer placed a month ago  Feels beter Has some CP if he starts walks on the treadmill.   Is able to walk through it and usually does ok  Lasts for a minutes.  Last cath was July 2019.  Conclusions: 1. Multivessel coronary artery disease, including diffuse calcified LAD disease of up to 60-70% (FFR 0.73), moderate to severe LCx and OM3 disease (FFR 0.83 at submaximal hyperemia), and moderate in-stent restenosis of mid RCA. 2. Normal left ventricular filling pressure. 3. Normal left ventricular systolic  function.  Recommendations: 1. Escalate antianginal therapy with addition of isosorbide mononitrate.  2. Continue aggressive secondary prevention, including dual antiplatelet and statin therapy. 3. I will review the films with the intervention and cardiac surgery teams. Revascularization options include atherectomy and PCI to LAD (would require multiple stents) versus CABG.  He has found that if he takes an extra metoprolol about an hour before he exercises that his angina is much better controlled.  We will have him continue that dosing scheme.  June 22, 2019:  Occasional CP ,  Has some chest pressure while on the treadmill.   Rests for 5-10 min then the discomfort resolves Has not improved with takeing metoprolol before his workout.  He also notes he is having more shortness of breath.  Has cut his salt intake .  BP is elevated today .   Has not been elevated at home   His last heart catheterization was in 2018.  He was found to have diffuse coronary artery disease.  We increased medical therapy.  Tested + for Covid in January.   May still be recovering from that    Sept. 3, 2021: Seen for follow up of his CAD, HLD,  Pacer  S/p pacer for bradycardia  Still has some occasional episodes of CP when he is going to bed.  Atypical  No CP with activity  No pressure  Just pins and needles sensation    July 05, 2020 Hx of CAD, pacer, HLD Still having some CP on occasion .  Also has panic attacks  Has some numbness in his hands. Can walk on the treadmill.   Does ok as long as he has not eaten anything recently  Cp may be gradually getting worse.  Relieved with SL NTG  Is on metoprolol, imdur Is intol to amlodipine  Has these discomforts several times a week ,  He thinks is progressing   Current Outpatient Medications on File Prior to Visit  Medication Sig Dispense Refill  . acetaminophen (TYLENOL) 500 MG tablet Take 1,000 mg by mouth every 6 (six) hours as needed (pain).     Marland Kitchen  albuterol (VENTOLIN HFA) 108 (90 Base) MCG/ACT inhaler INHALE 2 PUFFS INTO THE LUNGS EVERY 6 HOURS AS NEEDED FOR WHEEZE OR SHORTNESS OF BREATH 18 each 3  . aspirin EC 81 MG tablet Take 1 tablet (81 mg total) by mouth every evening. Resume 4 days post-op    . Choline Fenofibrate (FENOFIBRIC ACID) 135 MG CPDR TAKE 1 CAPSULE BY MOUTH EVERY DAY 30 capsule 11  . famotidine (PEPCID) 20 MG tablet Take 20 mg by mouth daily as needed for heartburn.    . hydrochlorothiazide (HYDRODIURIL) 25 MG tablet TAKE 1/2 TABLET BY MOUTH EVERY DAY 15 tablet 11  . isosorbide mononitrate (IMDUR) 60 MG 24 hr tablet Take 1 tablet (60 mg total) by mouth daily. 90 tablet 1  . metoprolol tartrate (LOPRESSOR) 25 MG tablet Take 1 tablet (25 mg total) by mouth 2 (two) times daily. Take addition one tablet 1 hr prior to exercise 225 tablet 1  . nitroGLYCERIN (NITROSTAT) 0.4 MG SL tablet Place 1 tablet (0.4 mg total) under the tongue every 5 (five) minutes as needed for chest pain. 25 tablet 6  . polyethylene glycol (MIRALAX / GLYCOLAX) packet Take 17 g by mouth daily as needed. 14 each 0  . RABEprazole (ACIPHEX) 20 MG tablet Take 1 tablet (20 mg total) by mouth daily in the afternoon. 30 tablet 11  . rosuvastatin (CRESTOR) 20 MG tablet TAKE 1 TABLET BY MOUTH EVERY DAY 30 tablet 11  No current facility-administered medications on file prior to visit.    Allergies  Allergen Reactions  . Amlodipine Besylate Other (See Comments)    Headache, weakness, dizziness  . Propofol Other (See Comments)    Heart rate dropped    . Lisinopril Cough and Rash  . Rosuvastatin Rash and Other (See Comments)     muscle aches   . Metoprolol     Lowered heart rate too much     Past Medical History:  Diagnosis Date  . 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 CAD (coronary artery disease)    moderate to severe distal CAD with large epicardial arteries having only mild to moderate irregularities. Last cath in Dec 2006; s/p cath  with Promus DES in distal RCA and Promus DES in the mid vessel in an overlapping fashion  . Chest pain   . Complication of anesthesia    heart rate dropping with propofol last procedure  . COPD (chronic obstructive pulmonary disease) ([place])   . ED (erectile dysfunction)   . Esophageal stricture   . External hemorrhoids without mention of complication   . GERD (gastroesophageal reflux disease)   . Headache(784.0)   . Hiatal hernia   . History of kidney stones   . HLD (hyperlipidemia)   . HTN (hypertension)   . Myocardial infarction ([place])    1996  . Personal history of colonic polyps 03/12/2010   TUBULAR ADENOMA  . Tobacco abuse     Past Surgical History:  Procedure Laterality Date  . ANGIOPLASTY     X3  . COLONOSCOPY N/A 02/16/2017   Procedure: COLONOSCOPY;  Surgeon: Jerene Bears, MD;  Location: [facility];  Service: Gastroenterology;  Laterality: N/A;  . INSERT / REPLACE / REMOVE PACEMAKER  06/20/2018  . LEFT HEART CATH AND CORONARY ANGIOGRAPHY N/A 11/05/2016   Procedure: Left Heart Cath and Coronary Angiography;  Surgeon: Nelva Bush, MD;  Location: [facility];  Service: Cardiovascular;  Laterality: N/A;  . LUMBAR LAMINECTOMY/DECOMPRESSION MICRODISCECTOMY N/A 05/12/2018   Procedure: Microlumbar decompression L2-3, possible L1-2;  Surgeon: Susa Day, MD;  Location: [facility];  Service: Orthopedics;  Laterality: N/A;  2 hrs, Microlumbar decompression L2-3, possible L1-2  . PACEMAKER IMPLANT N/A 06/20/2018   Procedure: PACEMAKER IMPLANT;  Surgeon: Evans Lance, MD;  Location: [facility];  Service: Cardiovascular;  Laterality: N/A;  . PERCUTANEOUS CORONARY ROTOBLATOR INTERVENTION (PCI-R) N/A 06/30/2012   Procedure: PERCUTANEOUS CORONARY ROTOBLATOR INTERVENTION (PCI-R);  Surgeon: Burnell Blanks, MD;  Location: [facility] C[facility] LAB;  Service: Cardiovascular;  Laterality: N/A;  . VIDEO BRONCHOSCOPY  08/13/2011   Procedure: VIDEO BRONCHOSCOPY WITHOUT FLUORO;   Surgeon: Tanda Rockers, MD;  Location: [facility];  Service: Cardiopulmonary;  Laterality: Bilateral;    Social History   Tobacco Use  Smoking Status Former Smoker  . Packs/day: 0.50  . Years: 40.00  . Pack years: 20.00  . Types: Cigarettes  . Quit date: 08/25/2011  . Years since quitting: 8.8  Smokeless Tobacco Never Used    Social History   Substance and Sexual Activity  Alcohol Use Yes   Comment: rare    Family History  Problem Relation Age of Onset  . Heart disease Father        hx of MI  . Diabetes Mother        and brother   . Kidney failure Mother   . Breast cancer Sister   . Colon cancer Neg  Hx   . Stomach cancer Neg Hx     Reviw of Systems:  Reviewed in the HPI.  All other systems are negative.  Physical Exam: Blood pressure 132/60, pulse 66, height 5\' 10"  (1.778 m), weight 195 lb (88.5 kg), SpO2 96 %.  GEN:  Well nourished, well developed in no acute distress HEENT: Normal NECK: No JVD; No carotid bruits LYMPHATICS: No lymphadenopathy CARDIAC: RRR , no murmurs, rubs, gallops RESPIRATORY:  Clear to auscultation without rales, wheezing or rhonchi  ABDOMEN: Soft, non-tender, non-distended MUSCULOSKELETAL:  No edema; No deformity  SKIN: Warm and dry NEUROLOGIC:  Alert and oriented x 3    ECG:     July 05, 2020: Normal sinus rhythm at 63.  Ventricular pacing.  Assessment / Plan:   1. COVID :     Getting over covid   2. CAD  -    he is having progressive angina symptoms.  He has known moderate to severe coronary artery disease.  His last heart catheterization was 2018.  We have performed stress Myoview's in the past and they have not been very helpful. We will schedule him for left heart catheterization with possible PCI.  I told him that he may wind up needing bypass surgery because of the diffuseness of his coronary artery disease.  We discussed the risk, benefits, options.  He understands and agrees to proceed.  He will need Covid testing  beforehand     3. Hyperlipidemia:      Cont current meds.   Check labs today    4.  Obstructive sleep apnea:        6 months to see an APP   Mertie Moores, MD  07/05/2020 1:44 PM    [facility] [place],  [place], [place]  [address] Pager [phone] Phone: [phone]; Fax: [phone]

## 2020-07-05 NOTE — Patient Instructions (Signed)
Medication Instructions:  Your physician recommends that you continue on your current medications as directed. Please refer to the Current Medication list given to you today.  *If you need a refill on your cardiac medications before your next appointment, please call your pharmacy*   Lab Work: Today: BMET, CBC, Lipids, ALT If you have labs (blood work) drawn today and your tests are completely normal, you will receive your results only by: Marland Kitchen MyChart Message (if you have MyChart) OR . A paper copy in the mail If you have any lab test that is abnormal or we need to change your treatment, we will call you to review the results.   Testing/Procedures:. Due to recent COVID-19 restrictions implemented by our local and state authorities and in an effort to keep both patients and staff as safe as possible, our hospital system requires COVID-19 testing prior to certain scheduled hospital procedures.  Please go to [place]. [place], [place] [address] on 07/10/20 at 1:45pm  .  This is a drive up testing site.  You will not need to exit your vehicle. You must agree to self-quarantine from the time of your testing until the procedure date on 07/12/2020.  This should included staying home with ONLY the people you live with.  Avoid take-out, grocery store shopping or leaving the house for any non-emergent reason.  Failure to have your COVID-19 test done on the date and time you have been scheduled will result in cancellation of your procedure.  Please call our office at [phone] if you have any questions.   Follow-Up: At [facility], you and your health needs are our priority.  As part of our continuing mission to provide you with exceptional heart care, we have created designated Provider Care Teams.  These Care Teams include your primary Cardiologist (physician) and Advanced Practice Providers (APPs -  Physician Assistants and Nurse Practitioners) who all work together to provide you with the care  you need, when you need it.   Your next appointment:   6 month(s)  The format for your next appointment:   In Person  Provider:   You will see one of the following Advanced Practice Providers on your designated Care Team:    Richardson Dopp, PA-C  Robbie Lis, [place]   Other Instructions    [facility] OFFICE [place], [place] [address] Dept: [phone] Loc: [place]  07/05/2020  You are scheduled for a Cardiac Catheterization on Friday, March 25 with Dr. Lauree Chandler.  1. Please arrive at the [facility] (Main Entrance A) at [facility]: [address] [place], [place] [address] at 8:30 AM (This time is two hours before your procedure to ensure your preparation). Free valet parking service is available.   Special note: Every effort is made to have your procedure done on time. Please understand that emergencies sometimes delay scheduled procedures.  2. Diet: Do not eat solid foods after midnight.  The patient may have clear liquids until 5am upon the day of the procedure.  3. Labs: You will need to have blood drawn Today.  4. Medication instructions in preparation for your procedure:   Contrast Allergy: No   DO NOT TAKE Your hydrochlorothiazide the morning of your procedure.    On the morning of your procedure, take your Aspirin and any morning medicines NOT listed above.  You may use sips of water.  5.  Plan for one night stay--bring personal belongings. 6. Bring a current list of your medications and current insurance cards. 7. You MUST have a responsible person to drive you home. 8. Someone MUST be with you the first 24 hours after you arrive home or your discharge will be delayed. 9. Please wear clothes that are easy to get on and off and wear slip-on shoes.  Thank you for allowing [place] to care for you!   -- [facility]

## 2020-07-06 LAB — CBC
Hematocrit: 42.5 % (ref 37.5–51.0)
Hemoglobin: 14.4 g/dL (ref 13.0–17.7)
MCH: 30.4 pg (ref 26.6–33.0)
MCHC: 33.9 g/dL (ref 31.5–35.7)
MCV: 90 fL (ref 79–97)
Platelets: 245 10*3/uL (ref 150–450)
RBC: 4.73 x10E6/uL (ref 4.14–5.80)
RDW: 13.5 % (ref 11.6–15.4)
WBC: 7.5 10*3/uL (ref 3.4–10.8)

## 2020-07-06 LAB — BASIC METABOLIC PANEL
BUN/Creatinine Ratio: 17 (ref 10–24)
BUN: 19 mg/dL (ref 8–27)
CO2: 24 mmol/L (ref 20–29)
Calcium: 9.6 mg/dL (ref 8.6–10.2)
Chloride: 101 mmol/L (ref 96–106)
Creatinine, Ser: 1.11 mg/dL (ref 0.76–1.27)
Glucose: 94 mg/dL (ref 65–99)
Potassium: 4.4 mmol/L (ref 3.5–5.2)
Sodium: 138 mmol/L (ref 134–144)
eGFR: 72 mL/min/{1.73_m2} (ref >59)

## 2020-07-06 LAB — ALT: ALT: 17 IU/L (ref 0–44)

## 2020-07-06 LAB — LIPID PANEL
Chol/HDL Ratio: 3.2 ratio (ref 0.0–5.0)
Cholesterol, Total: 146 mg/dL (ref 100–199)
HDL: 46 mg/dL (ref >39)
LDL Chol Calc (NIH): 71 mg/dL (ref 0–99)
Triglycerides: 174 mg/dL — ABNORMAL HIGH (ref 0–149)
VLDL Cholesterol Cal: 29 mg/dL (ref 5–40)

## 2020-07-07 ENCOUNTER — Other Ambulatory Visit (INDEPENDENT_AMBULATORY_CARE_PROVIDER_SITE_OTHER): Payer: Self-pay | Admitting: Otolaryngology

## 2020-07-09 NOTE — Addendum Note (Signed)
Addended by: Georgiann Cocker on: 07/09/2020 08:10 AM   Modules accepted: Orders

## 2020-07-10 ENCOUNTER — Other Ambulatory Visit (HOSPITAL_COMMUNITY)
Admission: RE | Admit: 2020-07-10 | Discharge: 2020-07-10 | Disposition: A | Discharge status: Home / Self Care | Payer: BC Managed Care – PPO | Source: Ambulatory Visit | Attending: Cardiovascular Disease | Admitting: Cardiovascular Disease

## 2020-07-10 DIAGNOSIS — Z7982 Long term (current) use of aspirin: Secondary | ICD-10-CM | POA: Diagnosis not present

## 2020-07-10 DIAGNOSIS — E785 Hyperlipidemia, unspecified: Secondary | ICD-10-CM | POA: Diagnosis not present

## 2020-07-10 DIAGNOSIS — Z79899 Other long term (current) drug therapy: Secondary | ICD-10-CM | POA: Diagnosis not present

## 2020-07-10 DIAGNOSIS — Z955 Presence of coronary angioplasty implant and graft: Secondary | ICD-10-CM | POA: Diagnosis not present

## 2020-07-10 DIAGNOSIS — Z888 Allergy status to other drugs, medicaments and biological substances status: Secondary | ICD-10-CM | POA: Diagnosis not present

## 2020-07-10 DIAGNOSIS — G4733 Obstructive sleep apnea (adult) (pediatric): Secondary | ICD-10-CM | POA: Diagnosis not present

## 2020-07-10 DIAGNOSIS — Z01812 Encounter for preprocedural laboratory examination: Secondary | ICD-10-CM | POA: Insufficient documentation

## 2020-07-10 DIAGNOSIS — Z87891 Personal history of nicotine dependence: Secondary | ICD-10-CM | POA: Diagnosis not present

## 2020-07-10 DIAGNOSIS — Z8249 Family history of ischemic heart disease and other diseases of the circulatory system: Secondary | ICD-10-CM | POA: Diagnosis not present

## 2020-07-10 DIAGNOSIS — I25110 Atherosclerotic heart disease of native coronary artery with unstable angina pectoris: Secondary | ICD-10-CM | POA: Diagnosis not present

## 2020-07-10 DIAGNOSIS — Z20822 Contact with and (suspected) exposure to covid-19: Secondary | ICD-10-CM | POA: Insufficient documentation

## 2020-07-10 DIAGNOSIS — Z8616 Personal history of COVID-19: Secondary | ICD-10-CM | POA: Diagnosis not present

## 2020-07-10 DIAGNOSIS — R001 Bradycardia, unspecified: Secondary | ICD-10-CM | POA: Diagnosis not present

## 2020-07-10 LAB — SARS CORONAVIRUS 2 (TAT 6-24 HRS): SARS Coronavirus 2: NEGATIVE

## 2020-07-11 ENCOUNTER — Telehealth: Payer: Self-pay | Admitting: *Deleted

## 2020-07-11 NOTE — Telephone Encounter (Signed)
Pt contacted pre-catheterization scheduled at [facility] for: Friday July 12, 2020 10:30 AM Verified arrival time and place: [facility]) at: 8:30 AM   No solid food after midnight prior to cath, clear liquids until 5 AM day of procedure.  Hold: HCTZ-AM of procedure  Except hold medications AM meds can be  taken pre-cath with sips of water including: ASA 81 mg   Confirmed patient has responsible adult to drive home post procedure and be with patient first 24 hours after arriving home: yes  You are allowed ONE visitor in the waiting room during the time you are at the hospital for your procedure. Both you and your visitor must wear a mask once you enter the hospital.  Reviewed procedure/mask/visitor instructions with patient.

## 2020-07-12 ENCOUNTER — Other Ambulatory Visit: Payer: Self-pay

## 2020-07-12 ENCOUNTER — Encounter (HOSPITAL_COMMUNITY)
Admission: RE | Disposition: A | Discharge status: Home / Self Care | Payer: Self-pay | Source: Home / Self Care | Attending: Cardiovascular Disease

## 2020-07-12 ENCOUNTER — Encounter (HOSPITAL_COMMUNITY): Payer: Self-pay | Admitting: Cardiovascular Disease

## 2020-07-12 ENCOUNTER — Telehealth: Payer: Self-pay

## 2020-07-12 ENCOUNTER — Ambulatory Visit (HOSPITAL_COMMUNITY)
Admission: RE | Admit: 2020-07-12 | Discharge: 2020-07-12 | Disposition: A | Discharge status: Home / Self Care | Payer: BC Managed Care – PPO | Attending: Cardiovascular Disease | Admitting: Cardiovascular Disease

## 2020-07-12 DIAGNOSIS — E785 Hyperlipidemia, unspecified: Secondary | ICD-10-CM | POA: Diagnosis not present

## 2020-07-12 DIAGNOSIS — Z888 Allergy status to other drugs, medicaments and biological substances status: Secondary | ICD-10-CM | POA: Insufficient documentation

## 2020-07-12 DIAGNOSIS — Z79899 Other long term (current) drug therapy: Secondary | ICD-10-CM | POA: Insufficient documentation

## 2020-07-12 DIAGNOSIS — Z7982 Long term (current) use of aspirin: Secondary | ICD-10-CM | POA: Diagnosis not present

## 2020-07-12 DIAGNOSIS — Z955 Presence of coronary angioplasty implant and graft: Secondary | ICD-10-CM | POA: Insufficient documentation

## 2020-07-12 DIAGNOSIS — G4733 Obstructive sleep apnea (adult) (pediatric): Secondary | ICD-10-CM | POA: Diagnosis not present

## 2020-07-12 DIAGNOSIS — Z20822 Contact with and (suspected) exposure to covid-19: Secondary | ICD-10-CM | POA: Insufficient documentation

## 2020-07-12 DIAGNOSIS — Z8616 Personal history of COVID-19: Secondary | ICD-10-CM | POA: Diagnosis not present

## 2020-07-12 DIAGNOSIS — I25110 Atherosclerotic heart disease of native coronary artery with unstable angina pectoris: Secondary | ICD-10-CM | POA: Diagnosis not present

## 2020-07-12 DIAGNOSIS — R001 Bradycardia, unspecified: Secondary | ICD-10-CM | POA: Diagnosis not present

## 2020-07-12 DIAGNOSIS — I25119 Atherosclerotic heart disease of native coronary artery with unspecified angina pectoris: Secondary | ICD-10-CM

## 2020-07-12 DIAGNOSIS — Z87891 Personal history of nicotine dependence: Secondary | ICD-10-CM | POA: Diagnosis not present

## 2020-07-12 DIAGNOSIS — Z8249 Family history of ischemic heart disease and other diseases of the circulatory system: Secondary | ICD-10-CM | POA: Insufficient documentation

## 2020-07-12 HISTORY — PX: LEFT HEART CATH AND CORONARY ANGIOGRAPHY: CATH118249

## 2020-07-12 LAB — POCT I-STAT EG7
Acid-Base Excess: 0 mmol/L (ref 0.0–2.0)
Bicarbonate: 26.5 mmol/L (ref 20.0–28.0)
Calcium, Ion: 1.3 mmol/L (ref 1.15–1.40)
HCT: 29 % — ABNORMAL LOW (ref 39.0–52.0)
Hemoglobin: 9.9 g/dL — ABNORMAL LOW (ref 13.0–17.0)
O2 Saturation: 72 %
Potassium: 4.4 mmol/L (ref 3.5–5.1)
Sodium: 140 mmol/L (ref 135–145)
TCO2: 28 mmol/L (ref 22–32)
pCO2, Ven: 49.1 mmHg (ref 44.0–60.0)
pH, Ven: 7.34 (ref 7.250–7.430)
pO2, Ven: 41 mmHg (ref 32.0–45.0)

## 2020-07-12 LAB — POCT I-STAT 7, (LYTES, BLD GAS, ICA,H+H)
Acid-Base Excess: 1 mmol/L (ref 0.0–2.0)
Bicarbonate: 26.5 mmol/L (ref 20.0–28.0)
Calcium, Ion: 1.28 mmol/L (ref 1.15–1.40)
HCT: 29 % — ABNORMAL LOW (ref 39.0–52.0)
Hemoglobin: 9.9 g/dL — ABNORMAL LOW (ref 13.0–17.0)
O2 Saturation: 99 %
Potassium: 4.4 mmol/L (ref 3.5–5.1)
Sodium: 141 mmol/L (ref 135–145)
TCO2: 28 mmol/L (ref 22–32)
pCO2 arterial: 45.5 mmHg (ref 32.0–48.0)
pH, Arterial: 7.373 (ref 7.350–7.450)
pO2, Arterial: 152 mmHg — ABNORMAL HIGH (ref 83.0–108.0)

## 2020-07-12 SURGERY — LEFT HEART CATH AND CORONARY ANGIOGRAPHY
Anesthesia: LOCAL

## 2020-07-12 MED ORDER — ACETAMINOPHEN 325 MG PO TABS: 650.0000 mg | ORAL_TABLET | ORAL | Status: DC | PRN

## 2020-07-12 MED ORDER — FENTANYL CITRATE (PF) 100 MCG/2ML IJ SOLN
INTRAMUSCULAR | Status: AC
  Filled 2020-07-12: qty 2

## 2020-07-12 MED ORDER — SODIUM CHLORIDE 0.9 % IV SOLN: INTRAVENOUS | Status: AC

## 2020-07-12 MED ORDER — RANOLAZINE ER 500 MG PO TB12: 500.0000 mg | ORAL_TABLET | Freq: Two times a day (BID) | ORAL | 3 refills | Status: DC

## 2020-07-12 MED ORDER — IOHEXOL 350 MG/ML SOLN
INTRAVENOUS | Status: DC | PRN
  Administered 2020-07-12: 100 mL via INTRA_ARTERIAL

## 2020-07-12 MED ORDER — SODIUM CHLORIDE 0.9% FLUSH: 3.0000 mL | INTRAVENOUS | Status: DC | PRN

## 2020-07-12 MED ORDER — ONDANSETRON HCL 4 MG/2ML IJ SOLN: 4.0000 mg | Freq: Four times a day (QID) | INTRAMUSCULAR | Status: DC | PRN

## 2020-07-12 MED ORDER — VERAPAMIL HCL 2.5 MG/ML IV SOLN
INTRAVENOUS | Status: AC
  Filled 2020-07-12: qty 2

## 2020-07-12 MED ORDER — SODIUM CHLORIDE 0.9% FLUSH: 3.0000 mL | Freq: Two times a day (BID) | INTRAVENOUS | Status: DC

## 2020-07-12 MED ORDER — MIDAZOLAM HCL 2 MG/2ML IJ SOLN
INTRAMUSCULAR | Status: AC
  Filled 2020-07-12: qty 2

## 2020-07-12 MED ORDER — LIDOCAINE HCL (PF) 1 % IJ SOLN
INTRAMUSCULAR | Status: DC | PRN
  Administered 2020-07-12: 2 mL

## 2020-07-12 MED ORDER — VERAPAMIL HCL 2.5 MG/ML IV SOLN
INTRAVENOUS | Status: DC | PRN
  Administered 2020-07-12: 10 mL via INTRA_ARTERIAL

## 2020-07-12 MED ORDER — HEPARIN (PORCINE) IN NACL 1000-0.9 UT/500ML-% IV SOLN
INTRAVENOUS | Status: AC
  Filled 2020-07-12: qty 1000

## 2020-07-12 MED ORDER — SODIUM CHLORIDE 0.9 % IV SOLN: 250.0000 mL | INTRAVENOUS | Status: DC | PRN

## 2020-07-12 MED ORDER — HEPARIN SODIUM (PORCINE) 1000 UNIT/ML IJ SOLN
INTRAMUSCULAR | Status: AC
  Filled 2020-07-12: qty 1

## 2020-07-12 MED ORDER — HYDRALAZINE HCL 20 MG/ML IJ SOLN: 10.0000 mg | INTRAMUSCULAR | Status: DC | PRN

## 2020-07-12 MED ORDER — FENTANYL CITRATE (PF) 100 MCG/2ML IJ SOLN
INTRAMUSCULAR | Status: DC | PRN
  Administered 2020-07-12: 50 ug via INTRAVENOUS

## 2020-07-12 MED ORDER — HEPARIN SODIUM (PORCINE) 1000 UNIT/ML IJ SOLN
INTRAMUSCULAR | Status: DC | PRN
  Administered 2020-07-12: 4500 [IU] via INTRAVENOUS

## 2020-07-12 MED ORDER — SODIUM CHLORIDE 0.9 % WEIGHT BASED INFUSION
3.0000 mL/kg/h | INTRAVENOUS | Status: AC
  Administered 2020-07-12: 3 mL/kg/h via INTRAVENOUS

## 2020-07-12 MED ORDER — HEPARIN (PORCINE) IN NACL 1000-0.9 UT/500ML-% IV SOLN
INTRAVENOUS | Status: DC | PRN
  Administered 2020-07-12 (×2): 500 mL

## 2020-07-12 MED ORDER — SODIUM CHLORIDE 0.9 % WEIGHT BASED INFUSION: 1.0000 mL/kg/h | INTRAVENOUS | Status: DC

## 2020-07-12 MED ORDER — MIDAZOLAM HCL 2 MG/2ML IJ SOLN
INTRAMUSCULAR | Status: DC | PRN
  Administered 2020-07-12: 2 mg via INTRAVENOUS

## 2020-07-12 MED ORDER — ASPIRIN 81 MG PO CHEW: 81.0000 mg | CHEWABLE_TABLET | ORAL | Status: DC

## 2020-07-12 MED ORDER — LIDOCAINE HCL (PF) 1 % IJ SOLN
INTRAMUSCULAR | Status: AC
  Filled 2020-07-12: qty 30

## 2020-07-12 MED ORDER — LABETALOL HCL 5 MG/ML IV SOLN: 10.0000 mg | INTRAVENOUS | Status: DC | PRN

## 2020-07-12 SURGICAL SUPPLY — 11 items
CATH 5FR JL3.5 JR4 ANG PIG MP (CATHETERS) ×1 IMPLANT
CATH INFINITI 5FR AL1 (CATHETERS) ×1 IMPLANT
DEVICE RAD COMP TR BAND LRG (VASCULAR PRODUCTS) ×1 IMPLANT
GLIDESHEATH SLEND SS 6F .021 (SHEATH) ×1 IMPLANT
GUIDEWIRE INQWIRE 1.5J.035X260 (WIRE) IMPLANT
INQWIRE 1.5J .035X260CM (WIRE) ×2
KIT HEART LEFT (KITS) ×2 IMPLANT
PACK CARDIAC CATHETERIZATION (CUSTOM PROCEDURE TRAY) ×2 IMPLANT
SYR MEDRAD MARK 7 150ML (SYRINGE) ×1 IMPLANT
TRANSDUCER W/STOPCOCK (MISCELLANEOUS) ×2 IMPLANT
TUBING CIL FLEX 10 FLL-RA (TUBING) ×2 IMPLANT

## 2020-07-12 NOTE — Progress Notes (Signed)
Arm board applied to right arm. Pt ambulated to the bathroom to void

## 2020-07-12 NOTE — Discharge Instructions (Signed)
Radial Site Care  This sheet gives you information about how to care for yourself after your procedure. Your health care provider may also give you more specific instructions. If you have problems or questions, contact your health care provider. What can I expect after the procedure? After the procedure, it is common to have:  Bruising and tenderness at the catheter insertion area. Follow these instructions at home: Medicines  Take over-the-counter and prescription medicines only as told by your health care provider. Insertion site care 1. Follow instructions from your health care provider about how to take care of your insertion site. Make sure you: ? Wash your hands with soap and water before you remove your bandage (dressing). If soap and water are not available, use hand sanitizer. ? May remove dressing in 24 hours. 2. Check your insertion site every day for signs of infection. Check for: ? Redness, swelling, or pain. ? Fluid or blood. ? Pus or a bad smell. ? Warmth. 3. Do no take baths, swim, or use a hot tub for 5 days. 4. You may shower 24-48 hours after the procedure. ? Remove the dressing and gently wash the site with plain soap and water. ? Pat the area dry with a clean towel. ? Do not rub the site. That could cause bleeding. 5. Do not apply powder or lotion to the site. Activity  1. For 24 hours after the procedure, or as directed by your health care provider: ? Do not flex or bend the affected arm. ? Do not push or pull heavy objects with the affected arm. ? Do not drive yourself home from the hospital or clinic. You may drive 24 hours after the procedure. ? Do not operate machinery or power tools. ? KEEP ARM ELEVATED THE REMAINDER OF THE DAY. 2. Do not push, pull or lift anything that is heavier than 10 lb for 5 days. 3. Ask your health care provider when it is okay to: ? Return to work or school. ? Resume usual physical activities or sports. ? Resume sexual  activity. General instructions  If the catheter site starts to bleed, raise your arm and put firm pressure on the site. If the bleeding does not stop, get help right away. This is a medical emergency.  DRINK PLENTY OF FLUIDS FOR THE NEXT 2-3 DAYS.  No alcohol consumption for 24 hours after receiving sedation.  If you went home on the same day as your procedure, a responsible adult should be with you for the first 24 hours after you arrive home.  Keep all follow-up visits as told by your health care provider. This is important. Contact a health care provider if:  You have a fever.  You have redness, swelling, or yellow drainage around your insertion site. Get help right away if:  You have unusual pain at the radial site.  The catheter insertion area swells very fast.  The insertion area is bleeding, and the bleeding does not stop when you hold steady pressure on the area.  Your arm or hand becomes pale, cool, tingly, or numb. These symptoms may represent a serious problem that is an emergency. Do not wait to see if the symptoms will go away. Get medical help right away. Call your local emergency services (911 in the [place] Do not drive yourself to the hospital. Summary  After the procedure, it is common to have bruising and tenderness at the site.  Follow instructions from your health care provider about how to take care   of your radial site wound. Check the wound every day for signs of infection.  This information is not intended to replace advice given to you by your health care provider. Make sure you discuss any questions you have with your health care provider. Document Revised: 05/12/2017 Document Reviewed: 05/12/2017 Elsevier Patient Education  2020 Elsevier Inc. 

## 2020-07-12 NOTE — Progress Notes (Signed)
Discharge instructions reviewed with pt and his wife. Both voice understanding. 

## 2020-07-12 NOTE — Interval H&P Note (Signed)
History and Physical Interval Note:  07/12/2020 9:16 AM  Daniel Carey  has presented today for surgery, with the diagnosis of unstable angina.  The various methods of treatment have been discussed with the patient and family. After consideration of risks, benefits and other options for treatment, the patient has consented to  Procedure(s): LEFT HEART CATH AND CORONARY ANGIOGRAPHY (N/A) as a surgical intervention.  The patient's history has been reviewed, patient examined, no change in status, stable for surgery.  I have reviewed the patient's chart and labs.  Questions were answered to the patient's satisfaction.    Cath Lab Visit (complete for each Cath Lab visit)  Clinical Evaluation Leading to the Procedure:   ACS: No.  Non-ACS:    Anginal Classification: CCS III  Anti-ischemic medical therapy: Maximal Therapy (2 or more classes of medications)  Non-Invasive Test Results: No non-invasive testing performed  Prior CABG: No previous CABG        Daniel Carey

## 2020-07-12 NOTE — Telephone Encounter (Signed)
Orders entered for Ranexa 500mg  BID. Patient notified of new medication orders.

## 2020-07-12 NOTE — Telephone Encounter (Signed)
-----   Message from Thayer Headings, MD sent at 07/12/2020  1:37 PM EDT ----- Annita Brod,  Will you see if you can schedule Mr. Brenton for an appt for Dr. Cyndia Bent in the next week or so .  Do we need to put in an official referral beyond this message.?  Donnetta Simpers, will you send in a prescription for Ranexa 500 mg BID   Thanks  Phil    ----- Message ----- From: Burnell Blanks, MD Sent: 07/12/2020  10:41 AM EDT To: Thayer Headings, MD  Phil, We just finished up his cath. His CAD has progressed. I think bypass is probably the right thing now. The LAD was positive by FFR in 2018. Looks about the same angiographically but the Circumflex has progressed including the lesion in OM3.   Maybe add Ranexa and refer to CT surgery as an outpatient. He may be a good person for [facility] to see.   Gerald Stabs

## 2020-07-15 ENCOUNTER — Encounter: Payer: Self-pay | Admitting: *Deleted

## 2020-07-16 ENCOUNTER — Other Ambulatory Visit: Payer: Self-pay

## 2020-07-16 ENCOUNTER — Encounter: Payer: Self-pay | Admitting: Surgery

## 2020-07-16 ENCOUNTER — Institutional Professional Consult (permissible substitution) (INDEPENDENT_AMBULATORY_CARE_PROVIDER_SITE_OTHER): Payer: BC Managed Care – PPO | Admitting: Surgery

## 2020-07-16 VITALS — BP 157/92 | HR 69 | Resp 20 | Ht 70.0 in | Wt 195.0 lb

## 2020-07-16 DIAGNOSIS — I2510 Atherosclerotic heart disease of native coronary artery without angina pectoris: Secondary | ICD-10-CM

## 2020-07-17 ENCOUNTER — Other Ambulatory Visit: Payer: Self-pay | Admitting: *Deleted

## 2020-07-17 ENCOUNTER — Encounter: Payer: Self-pay | Admitting: Internal Medicine

## 2020-07-17 ENCOUNTER — Encounter: Payer: Self-pay | Admitting: *Deleted

## 2020-07-17 DIAGNOSIS — I2510 Atherosclerotic heart disease of native coronary artery without angina pectoris: Secondary | ICD-10-CM

## 2020-07-17 NOTE — Progress Notes (Signed)
PERIOPERATIVE PRESCRIPTION FOR IMPLANTED CARDIAC DEVICE PROGRAMMING  Patient Information: Name:  Daniel Carey  DOB:  [birth year]  MRN:  [id number]    Planned Procedure: CABG  Surgeon: Gilford Raid  Date of Procedure: 07/19/20  Cautery will be used.  Position during surgery: supine   Please send documentation back to:  [facility] (Fax # [phone])   Device Information:  Clinic EP Physician:  [name], MD   Device Type:  Pacemaker Manufacturer and Phone #:  [employer]: [phone] Pacemaker Dependent?:  Yes.   Date of Last Device Check:  06/25/20 Normal Device Function?:  Yes.    Electrophysiologist's Recommendations:   Have magnet available.  Provide continuous ECG monitoring when magnet is used or reprogramming is to be performed.   Procedure will likely interfere with device function.  Device should be programmed:  Asynchronous pacing during procedure and returned to normal programming after procedure  Per Device Clinic Standing Orders, Simone Curia, RN  4:37 PM 07/17/2020

## 2020-07-17 NOTE — Progress Notes (Signed)
PCP is Vivi Barrack, MD Referring Provider is Nahser, Wonda Cheng, MD  Chief Complaint  Patient presents with  . 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  He had a catheterization in July 2018 which showed multivessel coronary disease with a diffusely calcified LAD with up to 60 to 70% stenosis.  The FFR at that time was 0.73.  There is moderate to severe left circumflex disease with a large third marginal branch that had an FFR of 0.83.  There is moderate in-stent restenosis in the mid RCA.  His medical therapy was increased and he continued doing fairly well with some intermittent chest pain that appeared to be atypical.  He now presents with a several month history of progressive substernal chest discomfort occurring with exertion such as walking on treadmill.  He also has this with eating and generally feels very poor when it occurs.  He has also had some numbness down his arms into the hands.  He has associated shortness of breath.  He underwent repeat cardiac catheterization on 07/12/2020 which showed severe three-vessel coronary disease.  The LAD had 65% proximal and 70% mid and distal stenoses.  The left circumflex had 2 diffusely diseased small marginal branches with 90% stenoses and then a larger third marginal branch that had 90% proximal stenosis.  The RCA was diffusely irregular with 60% in-stent restenosis.  There was normal left ventricular systolic function.  The patient lives at home with his wife.  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  Past Medical History:  Diagnosis Date   . 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 CAD (coronary artery disease)    moderate to severe distal CAD with large epicardial arteries having only mild to moderate irregularities. Last cath in Dec 2006; s/p cath with Promus DES in distal RCA and Promus DES in the mid vessel in an overlapping fashion  . Chest pain   . Complication of anesthesia    heart rate dropping with propofol last procedure  . COPD (chronic obstructive pulmonary disease) ([place])   . ED (erectile dysfunction)   . Esophageal stricture   . External hemorrhoids without mention of complication   . GERD (gastroesophageal reflux disease)   . Headache(784.0)   . Hiatal hernia   . History of kidney stones   . HLD (hyperlipidemia)   . HTN (hypertension)   . Myocardial infarction ([place])    1996  . Personal history of colonic polyps 03/12/2010   TUBULAR ADENOMA  . Tobacco abuse   . Tubular adenoma of colon     Past Surgical History:  Procedure Laterality Date  . ANGIOPLASTY     X3  . COLONOSCOPY N/A 02/16/2017   Procedure: COLONOSCOPY;  Surgeon: Jerene Bears, MD;  Location: [facility];  Service: Gastroenterology;  Laterality: N/A;  . INSERT / REPLACE / REMOVE PACEMAKER  06/20/2018  . LEFT HEART CATH AND CORONARY ANGIOGRAPHY N/A 11/05/2016   Procedure: Left Heart Cath and Coronary Angiography;  Surgeon: Nelva Bush, MD;  Location: [facility];  Service:  Cardiovascular;  Laterality: N/A;  . LEFT HEART CATH AND CORONARY ANGIOGRAPHY N/A 07/12/2020   Procedure: LEFT HEART CATH AND CORONARY ANGIOGRAPHY;  Surgeon: Burnell Blanks, MD;  Location: [facility];  Service: Cardiovascular;  Laterality: N/A;  . LUMBAR LAMINECTOMY/DECOMPRESSION MICRODISCECTOMY N/A 05/12/2018   Procedure: Microlumbar decompression L2-3, possible L1-2;  Surgeon: Susa Day, MD;  Location: [place];  Service: Orthopedics;  Laterality: N/A;  2 hrs, Microlumbar decompression L2-3, possible L1-2  . PACEMAKER IMPLANT N/A  06/20/2018   Procedure: PACEMAKER IMPLANT;  Surgeon: Evans Lance, MD;  Location: [facility];  Service: Cardiovascular;  Laterality: N/A;  . PERCUTANEOUS CORONARY ROTOBLATOR INTERVENTION (PCI-R) N/A 06/30/2012   Procedure: PERCUTANEOUS CORONARY ROTOBLATOR INTERVENTION (PCI-R);  Surgeon: Burnell Blanks, MD;  Location: [facility];  Service: Cardiovascular;  Laterality: N/A;  . VIDEO BRONCHOSCOPY  08/13/2011   Procedure: VIDEO BRONCHOSCOPY WITHOUT FLUORO;  Surgeon: Tanda Rockers, MD;  Location: [facility];  Service: Cardiopulmonary;  Laterality: Bilateral;    Family History  Problem Relation Age of Onset  . Heart disease Father        hx of MI  . Diabetes Mother        and brother   . Kidney failure Mother   . Breast cancer Sister   . Colon cancer Neg Hx   . Stomach cancer Neg Hx     Social History Social History   Tobacco Use  . Smoking status: Former Smoker    Packs/day: 0.50    Years: 40.00    Pack years: 20.00    Types: Cigarettes    Quit date: 08/25/2011    Years since quitting: 8.9  . Smokeless tobacco: Never Used  Vaping Use  . Vaping Use: Never used  Substance Use Topics  . Alcohol use: Yes    Comment: rare  . Drug use: No    Current Outpatient Medications  Medication Sig Dispense Refill  . acetaminophen (TYLENOL) 500 MG tablet Take 1,000 mg by mouth every 6 (six) hours as needed (pain).     Marland Kitchen albuterol (VENTOLIN HFA) 108 (90 Base) MCG/ACT inhaler INHALE 2 PUFFS INTO THE LUNGS EVERY 6 HOURS AS NEEDED FOR WHEEZE OR SHORTNESS OF BREATH (Patient taking differently: Inhale 2 puffs into the lungs every 6 (six) hours as needed for wheezing or shortness of breath.) 18 each 3  . aspirin EC 81 MG tablet Take 1 tablet (81 mg total) by mouth every evening. Resume 4 days post-op    . Choline Fenofibrate (FENOFIBRIC ACID) 135 MG CPDR TAKE 1 CAPSULE BY MOUTH EVERY DAY (Patient taking differently: Take 135 mg by mouth every evening.) 30 capsule 11  . famotidine  (PEPCID) 20 MG tablet Take 20 mg by mouth daily as needed for heartburn.    . hydrochlorothiazide (HYDRODIURIL) 25 MG tablet TAKE 1/2 TABLET BY MOUTH EVERY DAY (Patient taking differently: Take 12.5 mg by mouth daily.) 15 tablet 11  . isosorbide mononitrate (IMDUR) 60 MG 24 hr tablet Take 1 tablet (60 mg total) by mouth daily. 90 tablet 1  . metoprolol tartrate (LOPRESSOR) 25 MG tablet Take 1 tablet (25 mg total) by mouth 2 (two) times daily. Take addition one tablet 1 hr prior to exercise 225 tablet 1  . niacin 500 MG tablet Take 500 mg by mouth at bedtime.    . nitroGLYCERIN (NITROSTAT) 0.4 MG SL tablet Place 1 tablet (0.4 mg total) under the tongue every 5 (five) minutes as needed for chest pain.  25 tablet 6  . omeprazole (PRILOSEC) 20 MG capsule Take 20 mg by mouth daily.    . RABEprazole (ACIPHEX) 20 MG tablet Take 1 tablet (20 mg total) by mouth daily in the afternoon. 30 tablet 11  . ranolazine (RANEXA) 500 MG 12 hr tablet Take 1 tablet (500 mg total) by mouth 2 (two) times daily. 180 tablet 3  . rosuvastatin (CRESTOR) 20 MG tablet TAKE 1 TABLET BY MOUTH EVERY DAY (Patient taking differently: Take 20 mg by mouth daily.) 30 tablet 11   No current facility-administered medications for this visit.    Allergies  Allergen Reactions  . Amlodipine Besylate Other (See Comments)    Headache, weakness, dizziness  . Propofol Other (See Comments)    Heart rate dropped    . Lisinopril Cough and Rash  . Rosuvastatin Rash and Other (See Comments)     muscle aches   . Metoprolol     Lowered heart rate too much     Review of Systems  Constitutional: Positive for activity change. Negative for fatigue.  HENT: Negative.   Eyes: Negative.   Respiratory: Positive for chest tightness and shortness of breath.   Cardiovascular: Positive for chest pain. Negative for leg swelling.  Gastrointestinal: Negative.   Endocrine: Negative.   Genitourinary: Negative.   Musculoskeletal: Negative.    Allergic/Immunologic: Negative.   Neurological: Negative.   Hematological: Negative.   Psychiatric/Behavioral: Negative.     BP (!) 157/92 (BP Location: Left Arm, Patient Position: Sitting)   Pulse 69   Resp 20   Ht 5\' 10"  (1.778 m)   Wt 195 lb (88.5 kg)   SpO2 97% Comment: RA  BMI 27.98 kg/m  Physical Exam Constitutional:      Appearance: Normal appearance. He is normal weight.  HENT:     Head: Normocephalic and atraumatic.  Eyes:     Extraocular Movements: Extraocular movements intact.     Conjunctiva/sclera: Conjunctivae normal.     Pupils: Pupils are equal, round, and reactive to light.  Neck:     Vascular: No carotid bruit.  Cardiovascular:     Rate and Rhythm: Normal rate and regular rhythm.     Pulses: Normal pulses.     Heart sounds: Normal heart sounds. No murmur heard.   Pulmonary:     Effort: Pulmonary effort is normal.     Breath sounds: Normal breath sounds.  Abdominal:     General: Abdomen is flat.     Palpations: Abdomen is soft.  Musculoskeletal:        General: No swelling.  Skin:    General: Skin is warm and dry.  Neurological:     General: No focal deficit present.     Mental Status: He is alert and oriented to person, place, and time.  Psychiatric:        Mood and Affect: Mood normal.        Behavior: Behavior normal.      Diagnostic Tests:  Physicians  Panel Physicians Referring Physician Case Authorizing Physician  Burnell Blanks, MD (Primary)      Procedures  LEFT HEART CATH AND CORONARY ANGIOGRAPHY   Conclusion    Ost 1st Diag lesion is 50% stenosed.  Prox LAD lesion is 65% stenosed.  Mid LAD lesion is 70% stenosed.  Dist LAD-1 lesion is 70% stenosed.  Dist LAD-2 lesion is 70% stenosed.  Ost LAD lesion is 20% stenosed.  1st Mrg lesion is 90% stenosed.  2nd Mrg lesion is 90% stenosed.  3rd Mrg lesion is 90% stenosed.  Mid Cx lesion is 60% stenosed.  Mid RCA to Dist RCA lesion is 40%  stenosed.  Ost RCA lesion is 40% stenosed.  Mid RCA lesion is 60% stenosed.  The left ventricular systolic function is normal.  LV end diastolic pressure is normal.  The left ventricular ejection fraction is 55-65% by visual estimate.  There is no mitral valve regurgitation.   Severe three vessel CAD The LAD has severe stenotic lesions in the mid and distal vessel. Diffuse disease throughout The Circumflex has a moderately severe mid stenosis. The first two OM branches are small with severe proximal disease. The larger caliber third obtuse marginal branch has severe stenosis.  The RCA is a large dominant vessel with patent stents from the proximal to the mid vessel. The long stented segment is patent with moderate restenosis.  Normal LV systolic function  Recommendations: He has progression of three vessel CAD since cath in 2018. The LAD lesions were flow limiting by FFR in 2018. He seems to be having accelerated angina despite medical therapy. I will ask Dr. Acie Fredrickson to add Ranexa. I think we can consider CABG. We can make an outpatient referral to CT surgery to discuss options for CABG.     Indications  Coronary artery disease of native artery of native heart with stable angina pectoris ([place]) [I25.118 (ICD-10-CM)]   Procedural Details  Technical Details Indication: Known severe three vessel CAD by cath in 2018. CABG recommended at that time but he has done well with medical therapy alone. Now with recent acceleration of angina.   Procedure: The risks, benefits, complications, treatment options, and expected outcomes were discussed with the patient. The patient and/or family concurred with the proposed plan, giving informed consent. The patient was brought to the cath lab after IV hydration was given. The patient was further sedated with Versed and Fentanyl. The right wrist was prepped and draped in a sterile fashion. 1% lidocaine was used for local anesthesia. Using the modified  Seldinger access technique, a 5 French sheath was placed in the right radial artery. 3 mg Verapamil was given through the sheath. 4500 units IV heparin was given. Standard diagnostic catheters were used to perform selective coronary angiography. A pigtail catheter was used to perform a left ventricular angiogram. The sheath was removed from the right radial artery and a Terumo hemostasis band was applied at the arteriotomy site on the right wrist.    Estimated blood loss <50 mL.   During this procedure medications were administered to achieve and maintain moderate conscious sedation while the patient's heart rate, blood pressure, and oxygen saturation were continuously monitored and I was present face-to-face 100% of this time.   Medications (Filter: Administrations occurring from [phone] to 1028 on 07/12/20)  fentaNYL (SUBLIMAZE) injection (mcg) Total dose:  50 mcg  Date/Time Rate/Dose/Volume Action   07/12/20 0942 50 mcg Given    midazolam (VERSED) injection (mg) Total dose:  2 mg  Date/Time Rate/Dose/Volume Action   07/12/20 0942 2 mg Given    lidocaine (PF) (XYLOCAINE) 1 % injection (mL) Total volume:  2 mL  Date/Time Rate/Dose/Volume Action   07/12/20 0944 2 mL Given    Radial Cocktail/Verapamil only (mL) Total volume:  10 mL  Date/Time Rate/Dose/Volume Action   07/12/20 0945 10 mL Given    heparin sodium (porcine) injection (Units) Total dose:  4,500 Units  Date/Time Rate/Dose/Volume Action   07/12/20 0946 4,500 Units Given    Heparin (Porcine) in NaCl  1000-0.9 UT/500ML-% SOLN (mL) Total volume:  1,000 mL  Date/Time Rate/Dose/Volume Action   07/12/20 1001 500 mL Given   1002 500 mL Given    iohexol (OMNIPAQUE) 350 MG/ML injection (mL) Total volume:  100 mL  Date/Time Rate/Dose/Volume Action   07/12/20 1005 100 mL Given     Sedation Time  Sedation Time Physician-1: 24 minutes 51 seconds   Contrast  Medication Name Total Dose  iohexol (OMNIPAQUE) 350  MG/ML injection 100 mL    Radiation/Fluoro  Fluoro time: 7.1 (min) DAP: 23.4 (Gycm2) Cumulative Air Kerma: 300.9 (mGy)   Complications   Complications documented before study signed (07/12/2020 10:36 AM)     LEFT HEART CATH AND CORONARY ANGIOGRAPHY  None Documented by Burnell Blanks, MD 07/12/2020 10:19 AM  Date Found: 07/12/2020  Time Range: Intraprocedure       Coronary Findings   Diagnostic Dominance: Right  Left Main  Vessel is large. Vessel is angiographically normal.  Left Anterior Descending  Vessel is large.  Ost LAD lesion is 20% stenosed. The lesion is eccentric. The lesion is calcified.  Prox LAD lesion is 65% stenosed. The lesion is eccentric. The lesion is moderately calcified.  Mid LAD lesion is 70% stenosed. The lesion is concentric.  Dist LAD-1 lesion is 70% stenosed.  Dist LAD-2 lesion is 70% stenosed.  First Diagonal Branch  Vessel is small in size.  Ost 1st Diag lesion is 50% stenosed.  Second Diagonal Branch  Vessel is small in size.  Third Diagonal Branch  Vessel is small in size.  Ramus Intermedius  Vessel is small. There is mild diffuse disease throughout the vessel.  Left Circumflex  Vessel is moderate in size.  Mid Cx lesion is 60% stenosed.  First Obtuse Marginal Branch  Vessel is small in size.  1st Mrg lesion is 90% stenosed.  Second Obtuse Marginal Branch  Vessel is small in size.  2nd Mrg lesion is 90% stenosed.  Third Obtuse Marginal Branch  Vessel is large in size.  3rd Mrg lesion is 90% stenosed.  Right Coronary Artery  Vessel is moderate in size.  Ost RCA lesion is 40% stenosed. The lesion is eccentric. The lesion is calcified.  Mid RCA lesion is 60% stenosed. The lesion was previously treated using a drug eluting stent over 2 years ago.  Mid RCA to Dist RCA lesion is 40% stenosed. The lesion is focal. The lesion was previously treated.  Right Posterior Descending Artery  Vessel is moderate in size. There is  mild disease in the vessel.  Right Posterior Atrioventricular Artery  Vessel is moderate in size. There is mild disease in the vessel.   Intervention   No interventions have been documented.  Wall Motion  Resting       All segments of the heart are normal.           Left Heart  Left Ventricle The left ventricular size is normal. The left ventricular systolic function is normal. LV end diastolic pressure is normal. The left ventricular ejection fraction is 55-65% by visual estimate. No regional wall motion abnormalities. There is no evidence of mitral regurgitation.   Coronary Diagrams   Diagnostic Dominance: Right    Intervention    Implants    No implant documentation for this case.   Syngo Images  Show images for CARDIAC CATHETERIZATION  Images on Long Term Storage  Show images for Arjuna, [place] to Procedure Log  Procedure Log     Hemo  Data  Flowsheet Row Most Recent Value  AO Systolic Pressure 95 mmHg  AO Diastolic Pressure 61 mmHg  AO Mean 77 mmHg  LV Systolic Pressure 563 mmHg  LV Diastolic Pressure 14 mmHg  LV EDP 22 mmHg  AOp Systolic Pressure 149 mmHg  AOp Diastolic Pressure 0 mmHg  AOp Mean Pressure 23 mmHg  LVp Systolic Pressure 95 mmHg  LVp Diastolic Pressure 12 mmHg  LVp EDP Pressure 21 mmHg     Impression:  This 70 year old gentleman has severe multivessel coronary disease with diffusely diseased and calcified vessels.  He had an abnormal FFR in the LAD by catheterization in 2018.  He has progressively worsening anginal symptoms that are limiting his lifestyle and ability to remain active.  I agree that coronary artery bypass graft surgery is the best treatment.  I would plan to use his left internal mammary artery to the LAD and will evaluate his left radial artery with arterial Dopplers to see if we can use that for his left circumflex.  Otherwise he will need vein graft to his left circumflex and distal right coronary  artery.  I would not use bilateral internal mammary artery grafts in this patient due to his history of diabetes and remote smoking with moderate emphysematous lung changes on previous chest CT in 2013.  I discussed the operative procedure with the patient and his wife including alternatives, benefits and risks; including but not limited to bleeding, blood transfusion, infection, stroke, myocardial infarction, graft failure, heart block requiring a permanent pacemaker, organ dysfunction, and death.  [name] understands and agrees to proceed.    Plan:  He will be scheduled for coronary bypass graft surgery with possible left radial artery graft depending on his arterial Dopplers on Friday, 07/19/2020.  I spent 60 minutes performing this consultation and > 50% of this time was spent face to face counseling and coordinating the care of this patient's severe multivessel coronary disease.   Gaye Pollack, MD [facility] [phone]

## 2020-07-17 NOTE — Progress Notes (Signed)
Surgical Instructions    Your procedure is scheduled on 07/19/20.  Report to [facility] Main Entrance "A" at 05:30 A.M., then check in with the Admitting office.  Call this number if you have problems the morning of surgery:  [phone]   If you have any questions prior to your surgery date call [phone]: Open Monday-Friday 8am-4pm    Remember:  Do not eat or drink after midnight the night before your surgery     Take these medicines the morning of surgery with A SIP OF WATER  acetaminophen (TYLENOL) if needed albuterol (VENTOLIN HFA) if needed famotidine (PEPCID) if needed isosorbide mononitrate (IMDUR)  metoprolol tartrate (LOPRESSOR)  omeprazole (PRILOSEC)  ranolazine (RANEXA) rosuvastatin (CRESTOR)  As of today, STOP taking any Aspirin (unless otherwise instructed by your surgeon) Aleve, Naproxen, Ibuprofen, Motrin, Advil, Goody's, BC's, all herbal medications, fish oil, and all vitamins. Stop taking plavix 7 days prior to surgery.                     Do not wear jewelry, make up, or nail polish            Do not wear lotions, powders, perfumes/colognes, or deodorant.            Do not shave 48 hours prior to surgery.  Men may shave face and neck.            Do not bring valuables to the hospital.            [facility] is not responsible for any belongings or valuables.  Do NOT Smoke (Tobacco/Vaping) or drink Alcohol 24 hours prior to your procedure If you use a CPAP at night, you may bring all equipment for your overnight stay.   Contacts, glasses, dentures or bridgework may not be worn into surgery, please bring cases for these belongings   For patients admitted to the hospital, discharge time will be determined by your treatment team.   Patients discharged the day of surgery will not be allowed to drive home, and someone needs to stay with them for 24 hours.    Special instructions:   [place]- Preparing For Surgery  Before surgery, you can play an  important role. Because skin is not sterile, your skin needs to be as free of germs as possible. You can reduce the number of germs on your skin by washing with CHG (chlorahexidine gluconate) Soap before surgery.  CHG is an antiseptic cleaner which kills germs and bonds with the skin to continue killing germs even after washing.    Oral Hygiene is also important to reduce your risk of infection.  Remember - BRUSH YOUR TEETH THE MORNING OF SURGERY WITH YOUR REGULAR TOOTHPASTE  Please do not use if you have an allergy to CHG or antibacterial soaps. If your skin becomes reddened/irritated stop using the CHG.  Do not shave (including legs and underarms) for at least 48 hours prior to first CHG shower. It is OK to shave your face.  Please follow these instructions carefully.   1. Shower the NIGHT BEFORE SURGERY and the MORNING OF SURGERY  2. If you chose to wash your hair, wash your hair first as usual with your normal shampoo.  3. After you shampoo, rinse your hair and body thoroughly to remove the shampoo.  4. Wash Face and genitals (private parts) with your normal soap.   5.  Shower the NIGHT BEFORE SURGERY and the MORNING OF SURGERY with CHG Soap.  6. Use CHG Soap as you would any other liquid soap. You can apply CHG directly to the skin and wash gently with a scrungie or a clean washcloth.   7. Apply the CHG Soap to your body ONLY FROM THE NECK DOWN.  Do not use on open wounds or open sores. Avoid contact with your eyes, ears, mouth and genitals (private parts). Wash Face and genitals (private parts)  with your normal soap.   8. Wash thoroughly, paying special attention to the area where your surgery will be performed.  9. Thoroughly rinse your body with warm water from the neck down.  10. DO NOT shower/wash with your normal soap after using and rinsing off the CHG Soap.  11. Pat yourself dry with a CLEAN TOWEL.  12. Wear CLEAN PAJAMAS to bed the night before surgery  13. Place  CLEAN SHEETS on your bed the night before your surgery  14. DO NOT SLEEP WITH PETS.   Day of Surgery: Take a shower with CHG soap. Wear Clean/Comfortable clothing the morning of surgery Do not apply any deodorants/lotions.   Remember to brush your teeth WITH YOUR REGULAR TOOTHPASTE.   Please read over the following fact sheets that you were given.

## 2020-07-18 ENCOUNTER — Ambulatory Visit (HOSPITAL_BASED_OUTPATIENT_CLINIC_OR_DEPARTMENT_OTHER)
Admission: RE | Admit: 2020-07-18 | Discharge: 2020-07-18 | Disposition: A | Discharge status: Home / Self Care | Payer: BC Managed Care – PPO | Source: Ambulatory Visit | Attending: Surgery | Admitting: Surgery

## 2020-07-18 ENCOUNTER — Encounter (HOSPITAL_COMMUNITY)
Admission: RE | Admit: 2020-07-18 | Discharge: 2020-07-18 | Disposition: A | Discharge status: Home / Self Care | Payer: BC Managed Care – PPO | Source: Ambulatory Visit | Attending: Surgery | Admitting: Surgery

## 2020-07-18 ENCOUNTER — Encounter (HOSPITAL_COMMUNITY): Payer: Self-pay

## 2020-07-18 ENCOUNTER — Other Ambulatory Visit (HOSPITAL_COMMUNITY)
Admission: RE | Admit: 2020-07-18 | Discharge: 2020-07-18 | Disposition: A | Discharge status: Home / Self Care | Payer: BC Managed Care – PPO | Source: Ambulatory Visit | Attending: Surgery | Admitting: Surgery

## 2020-07-18 ENCOUNTER — Other Ambulatory Visit: Payer: Self-pay

## 2020-07-18 DIAGNOSIS — I6523 Occlusion and stenosis of bilateral carotid arteries: Secondary | ICD-10-CM | POA: Insufficient documentation

## 2020-07-18 DIAGNOSIS — I252 Old myocardial infarction: Secondary | ICD-10-CM | POA: Diagnosis not present

## 2020-07-18 DIAGNOSIS — G4733 Obstructive sleep apnea (adult) (pediatric): Secondary | ICD-10-CM | POA: Insufficient documentation

## 2020-07-18 DIAGNOSIS — Z79899 Other long term (current) drug therapy: Secondary | ICD-10-CM | POA: Insufficient documentation

## 2020-07-18 DIAGNOSIS — K219 Gastro-esophageal reflux disease without esophagitis: Secondary | ICD-10-CM | POA: Insufficient documentation

## 2020-07-18 DIAGNOSIS — E1165 Type 2 diabetes mellitus with hyperglycemia: Secondary | ICD-10-CM | POA: Diagnosis not present

## 2020-07-18 DIAGNOSIS — I2510 Atherosclerotic heart disease of native coronary artery without angina pectoris: Secondary | ICD-10-CM

## 2020-07-18 DIAGNOSIS — Z20822 Contact with and (suspected) exposure to covid-19: Secondary | ICD-10-CM | POA: Insufficient documentation

## 2020-07-18 DIAGNOSIS — Z736 Limitation of activities due to disability: Secondary | ICD-10-CM | POA: Diagnosis not present

## 2020-07-18 DIAGNOSIS — I441 Atrioventricular block, second degree: Secondary | ICD-10-CM | POA: Insufficient documentation

## 2020-07-18 DIAGNOSIS — I2584 Coronary atherosclerosis due to calcified coronary lesion: Secondary | ICD-10-CM | POA: Insufficient documentation

## 2020-07-18 DIAGNOSIS — I10 Essential (primary) hypertension: Secondary | ICD-10-CM | POA: Insufficient documentation

## 2020-07-18 DIAGNOSIS — Y831 Surgical operation with implant of artificial internal device as the cause of abnormal reaction of the patient, or of later complication, without mention of misadventure at the time of the procedure: Secondary | ICD-10-CM | POA: Diagnosis present

## 2020-07-18 DIAGNOSIS — J9811 Atelectasis: Secondary | ICD-10-CM | POA: Diagnosis not present

## 2020-07-18 DIAGNOSIS — J439 Emphysema, unspecified: Secondary | ICD-10-CM | POA: Diagnosis not present

## 2020-07-18 DIAGNOSIS — Z7984 Long term (current) use of oral hypoglycemic drugs: Secondary | ICD-10-CM | POA: Diagnosis not present

## 2020-07-18 DIAGNOSIS — Z01812 Encounter for preprocedural laboratory examination: Secondary | ICD-10-CM | POA: Insufficient documentation

## 2020-07-18 DIAGNOSIS — Z01818 Encounter for other preprocedural examination: Secondary | ICD-10-CM | POA: Insufficient documentation

## 2020-07-18 DIAGNOSIS — D62 Acute posthemorrhagic anemia: Secondary | ICD-10-CM | POA: Diagnosis not present

## 2020-07-18 DIAGNOSIS — Z950 Presence of cardiac pacemaker: Secondary | ICD-10-CM | POA: Diagnosis not present

## 2020-07-18 DIAGNOSIS — I088 Other rheumatic multiple valve diseases: Secondary | ICD-10-CM | POA: Diagnosis not present

## 2020-07-18 DIAGNOSIS — J449 Chronic obstructive pulmonary disease, unspecified: Secondary | ICD-10-CM | POA: Insufficient documentation

## 2020-07-18 DIAGNOSIS — Z8249 Family history of ischemic heart disease and other diseases of the circulatory system: Secondary | ICD-10-CM | POA: Diagnosis not present

## 2020-07-18 DIAGNOSIS — Z86010 Personal history of colonic polyps: Secondary | ICD-10-CM | POA: Insufficient documentation

## 2020-07-18 DIAGNOSIS — T82855A Stenosis of coronary artery stent, initial encounter: Secondary | ICD-10-CM | POA: Diagnosis not present

## 2020-07-18 DIAGNOSIS — I25118 Atherosclerotic heart disease of native coronary artery with other forms of angina pectoris: Secondary | ICD-10-CM | POA: Insufficient documentation

## 2020-07-18 DIAGNOSIS — Z7982 Long term (current) use of aspirin: Secondary | ICD-10-CM | POA: Insufficient documentation

## 2020-07-18 DIAGNOSIS — Z833 Family history of diabetes mellitus: Secondary | ICD-10-CM | POA: Diagnosis not present

## 2020-07-18 DIAGNOSIS — I25119 Atherosclerotic heart disease of native coronary artery with unspecified angina pectoris: Secondary | ICD-10-CM | POA: Diagnosis not present

## 2020-07-18 DIAGNOSIS — Z955 Presence of coronary angioplasty implant and graft: Secondary | ICD-10-CM | POA: Insufficient documentation

## 2020-07-18 DIAGNOSIS — E785 Hyperlipidemia, unspecified: Secondary | ICD-10-CM | POA: Diagnosis not present

## 2020-07-18 DIAGNOSIS — J811 Chronic pulmonary edema: Secondary | ICD-10-CM | POA: Diagnosis not present

## 2020-07-18 DIAGNOSIS — Z87891 Personal history of nicotine dependence: Secondary | ICD-10-CM | POA: Insufficient documentation

## 2020-07-18 DIAGNOSIS — R079 Chest pain, unspecified: Secondary | ICD-10-CM | POA: Diagnosis not present

## 2020-07-18 HISTORY — DX: Presence of cardiac pacemaker: Z95.0

## 2020-07-18 HISTORY — DX: Dyspnea, unspecified: R06.00

## 2020-07-18 HISTORY — DX: Unspecified osteoarthritis, unspecified site: M19.90

## 2020-07-18 LAB — COMPREHENSIVE METABOLIC PANEL
ALT: 20 U/L (ref 0–44)
AST: 17 U/L (ref 15–41)
Albumin: 3.8 g/dL (ref 3.5–5.0)
Alkaline Phosphatase: 51 U/L (ref 38–126)
Anion gap: 9 (ref 5–15)
BUN: 20 mg/dL (ref 8–23)
CO2: 22 mmol/L (ref 22–32)
Calcium: 9.6 mg/dL (ref 8.9–10.3)
Chloride: 102 mmol/L (ref 98–111)
Creatinine, Ser: 1.21 mg/dL (ref 0.61–1.24)
GFR, Estimated: >60 mL/min (ref >60)
Glucose, Bld: 171 mg/dL — ABNORMAL HIGH (ref 70–99)
Potassium: 4.2 mmol/L (ref 3.5–5.1)
Sodium: 133 mmol/L — ABNORMAL LOW (ref 135–145)
Total Bilirubin: 0.8 mg/dL (ref 0.3–1.2)
Total Protein: 6.7 g/dL (ref 6.5–8.1)

## 2020-07-18 LAB — URINALYSIS, ROUTINE W REFLEX MICROSCOPIC
Bilirubin Urine: NEGATIVE
Glucose, UA: NEGATIVE mg/dL
Hgb urine dipstick: NEGATIVE
Ketones, ur: NEGATIVE mg/dL
Leukocytes,Ua: NEGATIVE
Nitrite: NEGATIVE
Protein, ur: NEGATIVE mg/dL
Specific Gravity, Urine: 1.012 (ref 1.005–1.030)
pH: 7 (ref 5.0–8.0)

## 2020-07-18 LAB — CBC
HCT: 42.7 % (ref 39.0–52.0)
Hemoglobin: 14.6 g/dL (ref 13.0–17.0)
MCH: 30.5 pg (ref 26.0–34.0)
MCHC: 34.2 g/dL (ref 30.0–36.0)
MCV: 89.3 fL (ref 80.0–100.0)
Platelets: 244 10*3/uL (ref 150–400)
RBC: 4.78 MIL/uL (ref 4.22–5.81)
RDW: 13.4 % (ref 11.5–15.5)
WBC: 5.8 10*3/uL (ref 4.0–10.5)
nRBC: 0 % (ref 0.0–0.2)

## 2020-07-18 LAB — TYPE AND SCREEN
ABO/RH(D): O POS
Antibody Screen: NEGATIVE

## 2020-07-18 LAB — BLOOD GAS, ARTERIAL
Acid-Base Excess: 0 mmol/L (ref 0.0–2.0)
Bicarbonate: 24 mmol/L (ref 20.0–28.0)
FIO2: 21
O2 Saturation: 96.3 %
Patient temperature: 37
pCO2 arterial: 38.1 mmHg (ref 32.0–48.0)
pH, Arterial: 7.416 (ref 7.350–7.450)
pO2, Arterial: 90.4 mmHg (ref 83.0–108.0)

## 2020-07-18 LAB — ECHOCARDIOGRAM COMPLETE
AR max vel: 1.5 cm2
AV Area VTI: 1.54 cm2
AV Area mean vel: 1.59 cm2
AV Mean grad: 6 mmHg
AV Peak grad: 13.5 mmHg
Ao pk vel: 1.84 m/s
Area-P 1/2: 2.05 cm2
S' Lateral: 3 cm
Single Plane A4C EF: 50.7 %

## 2020-07-18 LAB — HEMOGLOBIN A1C
Hgb A1c MFr Bld: 7.3 % — ABNORMAL HIGH (ref 4.8–5.6)
Mean Plasma Glucose: 162.81 mg/dL

## 2020-07-18 LAB — SARS CORONAVIRUS 2 (TAT 6-24 HRS): SARS Coronavirus 2: NEGATIVE

## 2020-07-18 LAB — SURGICAL PCR SCREEN
MRSA, PCR: NEGATIVE
Staphylococcus aureus: NEGATIVE

## 2020-07-18 LAB — APTT: aPTT: 33 seconds (ref 24–36)

## 2020-07-18 LAB — PROTIME-INR
INR: 1.1 (ref 0.8–1.2)
Prothrombin Time: 13.5 seconds (ref 11.4–15.2)

## 2020-07-18 MED ORDER — POTASSIUM CHLORIDE 2 MEQ/ML IV SOLN: 80.0000 meq | INTRAVENOUS | Status: DC

## 2020-07-18 MED ORDER — TRANEXAMIC ACID (OHS) BOLUS VIA INFUSION: 15.0000 mg/kg | INTRAVENOUS | Status: DC

## 2020-07-18 MED ORDER — PHENYLEPHRINE HCL-NACL 20-0.9 MG/250ML-% IV SOLN
30.0000 ug/min | INTRAVENOUS | Status: DC
  Filled 2020-07-18: qty 250

## 2020-07-18 MED ORDER — SODIUM CHLORIDE 0.9 % IV SOLN: 1.5000 g | INTRAVENOUS | Status: DC

## 2020-07-18 MED ORDER — SODIUM CHLORIDE 0.9 % IV SOLN
1.5000 g | INTRAVENOUS | Status: DC
  Filled 2020-07-18: qty 1.5

## 2020-07-18 MED ORDER — NITROGLYCERIN IN D5W 200-5 MCG/ML-% IV SOLN: 2.0000 ug/min | INTRAVENOUS | Status: DC

## 2020-07-18 MED ORDER — TRANEXAMIC ACID (OHS) BOLUS VIA INFUSION
15.0000 mg/kg | INTRAVENOUS | Status: AC
  Administered 2020-07-19: 1327.5 mg via INTRAVENOUS
  Filled 2020-07-18: qty 1328

## 2020-07-18 MED ORDER — PLASMA-LYTE 148 IV SOLN
INTRAVENOUS | Status: AC
  Administered 2020-07-19: 500 mL
  Filled 2020-07-18: qty 2.5

## 2020-07-18 MED ORDER — SODIUM CHLORIDE 0.9 % IV SOLN: 750.0000 mg | INTRAVENOUS | Status: DC

## 2020-07-18 MED ORDER — NITROGLYCERIN IN D5W 200-5 MCG/ML-% IV SOLN
2.0000 ug/min | INTRAVENOUS | Status: DC
  Filled 2020-07-18: qty 250

## 2020-07-18 MED ORDER — NOREPINEPHRINE 4 MG/250ML-% IV SOLN
0.0000 ug/min | INTRAVENOUS | Status: DC
  Filled 2020-07-18: qty 250

## 2020-07-18 MED ORDER — NOREPINEPHRINE 4 MG/250ML-% IV SOLN: 0.0000 ug/min | INTRAVENOUS | Status: DC

## 2020-07-18 MED ORDER — PLASMA-LYTE 148 IV SOLN: INTRAVENOUS | Status: DC

## 2020-07-18 MED ORDER — MAGNESIUM SULFATE 50 % IJ SOLN
40.0000 meq | INTRAMUSCULAR | Status: DC
  Filled 2020-07-18: qty 9.85

## 2020-07-18 MED ORDER — DEXMEDETOMIDINE HCL IN NACL 400 MCG/100ML IV SOLN
0.1000 ug/kg/h | INTRAVENOUS | Status: DC
  Filled 2020-07-18: qty 100

## 2020-07-18 MED ORDER — POTASSIUM CHLORIDE 2 MEQ/ML IV SOLN
80.0000 meq | INTRAVENOUS | Status: DC
  Filled 2020-07-18: qty 40

## 2020-07-18 MED ORDER — TRANEXAMIC ACID 1000 MG/10ML IV SOLN
1.5000 mg/kg/h | INTRAVENOUS | Status: DC
  Filled 2020-07-18: qty 25

## 2020-07-18 MED ORDER — EPINEPHRINE HCL 5 MG/250ML IV SOLN IN NS
0.0000 ug/min | INTRAVENOUS | Status: DC
  Filled 2020-07-18: qty 250

## 2020-07-18 MED ORDER — PHENYLEPHRINE HCL-NACL 20-0.9 MG/250ML-% IV SOLN: 30.0000 ug/min | INTRAVENOUS | Status: DC

## 2020-07-18 MED ORDER — VANCOMYCIN HCL 1500 MG/300ML IV SOLN
1500.0000 mg | INTRAVENOUS | Status: DC
  Filled 2020-07-18: qty 300

## 2020-07-18 MED ORDER — SODIUM CHLORIDE 0.9 % IV SOLN
INTRAVENOUS | Status: DC
  Filled 2020-07-18: qty 30

## 2020-07-18 MED ORDER — MAGNESIUM SULFATE 50 % IJ SOLN: 40.0000 meq | INTRAMUSCULAR | Status: DC

## 2020-07-18 MED ORDER — SODIUM CHLORIDE 0.9 % IV SOLN
750.0000 mg | INTRAVENOUS | Status: DC
  Filled 2020-07-18: qty 750

## 2020-07-18 MED ORDER — DEXMEDETOMIDINE HCL IN NACL 400 MCG/100ML IV SOLN: 0.1000 ug/kg/h | INTRAVENOUS | Status: DC

## 2020-07-18 MED ORDER — INSULIN REGULAR(HUMAN) IN NACL 100-0.9 UT/100ML-% IV SOLN: INTRAVENOUS | Status: DC

## 2020-07-18 MED ORDER — MILRINONE LACTATE IN DEXTROSE 20-5 MG/100ML-% IV SOLN
0.3000 ug/kg/min | INTRAVENOUS | Status: DC
  Filled 2020-07-18: qty 100

## 2020-07-18 MED ORDER — VANCOMYCIN HCL 1500 MG/300ML IV SOLN: 1500.0000 mg | INTRAVENOUS | Status: DC

## 2020-07-18 MED ORDER — TRANEXAMIC ACID 1000 MG/10ML IV SOLN: 1.5000 mg/kg/h | INTRAVENOUS | Status: DC

## 2020-07-18 MED ORDER — SODIUM CHLORIDE 0.9 % IV SOLN: INTRAVENOUS | Status: DC

## 2020-07-18 MED ORDER — TRANEXAMIC ACID (OHS) PUMP PRIME SOLUTION: 2.0000 mg/kg | INTRAVENOUS | Status: DC

## 2020-07-18 MED ORDER — TRANEXAMIC ACID (OHS) PUMP PRIME SOLUTION
2.0000 mg/kg | INTRAVENOUS | Status: DC
  Filled 2020-07-18: qty 1.77

## 2020-07-18 MED ORDER — INSULIN REGULAR(HUMAN) IN NACL 100-0.9 UT/100ML-% IV SOLN
INTRAVENOUS | Status: DC
  Filled 2020-07-18: qty 100

## 2020-07-18 MED ORDER — EPINEPHRINE HCL 5 MG/250ML IV SOLN IN NS: 0.0000 ug/min | INTRAVENOUS | Status: DC

## 2020-07-18 MED ORDER — MILRINONE LACTATE IN DEXTROSE 20-5 MG/100ML-% IV SOLN: 0.3000 ug/kg/min | INTRAVENOUS | Status: DC

## 2020-07-18 NOTE — H&P (Signed)
[address]       [name] [address]             [phone]      Cardiothoracic Surgery Admission History and Physical   PCP is Jerline Pain, Algis Greenhouse, MD Referring Provider is Nahser, Wonda Cheng, MD      Chief Complaint  Patient presents with  . 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  He had a catheterization in July 2018 which showed multivessel coronary disease with a diffusely calcified LAD with up to 60 to 70% stenosis.  The FFR at that time was 0.73.  There is moderate to severe left circumflex disease with a large third marginal branch that had an FFR of 0.83.  There is moderate in-stent restenosis in the mid RCA.  His medical therapy was increased and he continued doing fairly well with some intermittent chest pain that appeared to be atypical.  He now presents with a several month history of progressive substernal chest discomfort occurring with exertion such as walking on treadmill.  He also has this with eating and generally feels very poor when it occurs.  He has also had some numbness down his arms into the hands.  He has associated shortness of breath.  He underwent repeat cardiac catheterization on 07/12/2020 which showed severe three-vessel coronary disease.  The LAD had 65% proximal and 70% mid and distal stenoses.  The left circumflex had 2 diffusely diseased small marginal branches with 90% stenoses and then a larger third marginal branch that had 90% proximal stenosis.  The RCA was diffusely irregular with 60% in-stent restenosis.  There was normal left ventricular systolic function.  The patient lives at home with his wife.  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      Past Medical History:  Diagnosis Date  . 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 CAD (coronary artery disease)    moderate to severe distal CAD with large epicardial arteries having only mild to moderate irregularities. Last cath in Dec 2006; s/p cath with Promus DES in distal RCA and Promus DES in the mid vessel in an overlapping fashion  . Chest pain   . Complication of anesthesia    heart rate dropping with propofol last procedure  . COPD (chronic obstructive pulmonary disease) ([place])   . ED (erectile dysfunction)   . Esophageal stricture   . External hemorrhoids without mention of complication   . GERD (gastroesophageal reflux disease)   . Headache(784.0)   . Hiatal hernia   . History of kidney stones   . HLD (hyperlipidemia)   . HTN (hypertension)   . Myocardial infarction ([place])    1996  . Personal history of colonic polyps 03/12/2010   TUBULAR ADENOMA  . Tobacco abuse   . Tubular adenoma of colon          Past Surgical History:  Procedure Laterality Date  . ANGIOPLASTY     X3  . COLONOSCOPY N/A 02/16/2017   Procedure: COLONOSCOPY;  Surgeon: Jerene Bears, MD;  Location:  [facility];  Service: Gastroenterology;  Laterality: N/A;  . INSERT / REPLACE / REMOVE PACEMAKER  06/20/2018  . LEFT HEART CATH AND CORONARY ANGIOGRAPHY N/A 11/05/2016   Procedure: Left Heart Cath and Coronary Angiography;  Surgeon: Nelva Bush, MD;  Location: [place] [facility];  Service: Cardiovascular;  Laterality: N/A;  . LEFT HEART CATH AND CORONARY ANGIOGRAPHY N/A 07/12/2020   Procedure: LEFT HEART CATH AND CORONARY ANGIOGRAPHY;  Surgeon: Burnell Blanks, MD;  Location: [place] [facility];  Service: Cardiovascular;  Laterality: N/A;  . LUMBAR LAMINECTOMY/DECOMPRESSION MICRODISCECTOMY N/A 05/12/2018   Procedure: Microlumbar decompression L2-3, possible L1-2;   Surgeon: Susa Day, MD;  Location: [place];  Service: Orthopedics;  Laterality: N/A;  2 hrs, Microlumbar decompression L2-3, possible L1-2  . PACEMAKER IMPLANT N/A 06/20/2018   Procedure: PACEMAKER IMPLANT;  Surgeon: Evans Lance, MD;  Location: [place] [facility];  Service: Cardiovascular;  Laterality: N/A;  . PERCUTANEOUS CORONARY ROTOBLATOR INTERVENTION (PCI-R) N/A 06/30/2012   Procedure: PERCUTANEOUS CORONARY ROTOBLATOR INTERVENTION (PCI-R);  Surgeon: Burnell Blanks, MD;  Location: [facility];  Service: Cardiovascular;  Laterality: N/A;  . VIDEO BRONCHOSCOPY  08/13/2011   Procedure: VIDEO BRONCHOSCOPY WITHOUT FLUORO;  Surgeon: Tanda Rockers, MD;  Location: [facility];  Service: Cardiopulmonary;  Laterality: Bilateral;         Family History  Problem Relation Age of Onset  . Heart disease Father        hx of MI  . Diabetes Mother        and brother   . Kidney failure Mother   . Breast cancer Sister   . Colon cancer Neg Hx   . Stomach cancer Neg Hx     Social History Social History        Tobacco Use  . Smoking status: Former Smoker    Packs/day: 0.50    Years: 40.00    Pack years: 20.00    Types: Cigarettes    Quit date: 08/25/2011    Years since quitting: 8.9  . Smokeless tobacco: Never Used  Vaping Use  . Vaping Use: Never used  Substance Use Topics  . Alcohol use: Yes    Comment: rare  . Drug use: No    Current Outpatient Medications  Medication Sig Dispense Refill  . acetaminophen (TYLENOL) 500 MG tablet Take 1,000 mg by mouth every 6 (six) hours as needed (pain).     Marland Kitchen albuterol (VENTOLIN HFA) 108 (90 Base) MCG/ACT inhaler INHALE 2 PUFFS INTO THE LUNGS EVERY 6 HOURS AS NEEDED FOR WHEEZE OR SHORTNESS OF BREATH (Patient taking differently: Inhale 2 puffs into the lungs every 6 (six) hours as needed for wheezing or shortness of breath.) 18 each 3  . aspirin EC 81 MG tablet Take 1 tablet (81 mg total) by mouth every  evening. Resume 4 days post-op    . Choline Fenofibrate (FENOFIBRIC ACID) 135 MG CPDR TAKE 1 CAPSULE BY MOUTH EVERY DAY (Patient taking differently: Take 135 mg by mouth every evening.) 30 capsule 11  . famotidine (PEPCID) 20 MG tablet Take 20 mg by mouth daily as needed for heartburn.    . hydrochlorothiazide (HYDRODIURIL) 25 MG tablet TAKE 1/2 TABLET BY MOUTH EVERY DAY (Patient taking differently: Take 12.5 mg by mouth daily.) 15 tablet 11  . isosorbide mononitrate (IMDUR) 60 MG 24 hr tablet Take 1 tablet (60 mg total) by mouth daily. 90 tablet 1  . metoprolol tartrate (LOPRESSOR) 25 MG tablet Take 1 tablet (25 mg  total) by mouth 2 (two) times daily. Take addition one tablet 1 hr prior to exercise 225 tablet 1  . niacin 500 MG tablet Take 500 mg by mouth at bedtime.    . nitroGLYCERIN (NITROSTAT) 0.4 MG SL tablet Place 1 tablet (0.4 mg total) under the tongue every 5 (five) minutes as needed for chest pain. 25 tablet 6  . omeprazole (PRILOSEC) 20 MG capsule Take 20 mg by mouth daily.    . RABEprazole (ACIPHEX) 20 MG tablet Take 1 tablet (20 mg total) by mouth daily in the afternoon. 30 tablet 11  . ranolazine (RANEXA) 500 MG 12 hr tablet Take 1 tablet (500 mg total) by mouth 2 (two) times daily. 180 tablet 3  . rosuvastatin (CRESTOR) 20 MG tablet TAKE 1 TABLET BY MOUTH EVERY DAY (Patient taking differently: Take 20 mg by mouth daily.) 30 tablet 11   No current facility-administered medications for this visit.    Allergies  Allergen Reactions  . Amlodipine Besylate Other (See Comments)    Headache, weakness, dizziness  . Propofol Other (See Comments)    Heart rate dropped    . Lisinopril Cough and Rash  . Rosuvastatin Rash and Other (See Comments)     muscle aches   . Metoprolol     Lowered heart rate too much     Review of Systems  Constitutional: Positive for activity change. Negative for fatigue.  HENT: Negative.   Eyes: Negative.   Respiratory:  Positive for chest tightness and shortness of breath.   Cardiovascular: Positive for chest pain. Negative for leg swelling.  Gastrointestinal: Negative.   Endocrine: Negative.   Genitourinary: Negative.   Musculoskeletal: Negative.   Allergic/Immunologic: Negative.   Neurological: Negative.   Hematological: Negative.   Psychiatric/Behavioral: Negative.     BP (!) 157/92 (BP Location: Left Arm, Patient Position: Sitting)   Pulse 69   Resp 20   Ht 5\' 10"  (1.778 m)   Wt 195 lb (88.5 kg)   SpO2 97% Comment: RA  BMI 27.98 kg/m  Physical Exam Constitutional:      Appearance: Normal appearance. He is normal weight.  HENT:     Head: Normocephalic and atraumatic.  Eyes:     Extraocular Movements: Extraocular movements intact.     Conjunctiva/sclera: Conjunctivae normal.     Pupils: Pupils are equal, round, and reactive to light.  Neck:     Vascular: No carotid bruit.  Cardiovascular:     Rate and Rhythm: Normal rate and regular rhythm.     Pulses: Normal pulses.     Heart sounds: Normal heart sounds. No murmur heard.   Pulmonary:     Effort: Pulmonary effort is normal.     Breath sounds: Normal breath sounds.  Abdominal:     General: Abdomen is flat.     Palpations: Abdomen is soft.  Musculoskeletal:        General: No swelling.  Skin:    General: Skin is warm and dry.  Neurological:     General: No focal deficit present.     Mental Status: He is alert and oriented to person, place, and time.  Psychiatric:        Mood and Affect: Mood normal.        Behavior: Behavior normal.      Diagnostic Tests:  Physicians  Panel Physicians Referring Physician Case Authorizing Physician  Burnell Blanks, MD (Primary)      Procedures  LEFT HEART CATH AND CORONARY ANGIOGRAPHY  Conclusion    Ost 1st Diag lesion is 50% stenosed.  Prox LAD lesion is 65% stenosed.  Mid LAD lesion is 70% stenosed.  Dist LAD-1 lesion is 70% stenosed.  Dist  LAD-2 lesion is 70% stenosed.  Ost LAD lesion is 20% stenosed.  1st Mrg lesion is 90% stenosed.  2nd Mrg lesion is 90% stenosed.  3rd Mrg lesion is 90% stenosed.  Mid Cx lesion is 60% stenosed.  Mid RCA to Dist RCA lesion is 40% stenosed.  Ost RCA lesion is 40% stenosed.  Mid RCA lesion is 60% stenosed.  The left ventricular systolic function is normal.  LV end diastolic pressure is normal.  The left ventricular ejection fraction is 55-65% by visual estimate.  There is no mitral valve regurgitation.  Severe three vessel CAD The LAD has severe stenotic lesions in the mid and distal vessel. Diffuse disease throughout The Circumflex has a moderately severe mid stenosis. The first two OM branches are small with severe proximal disease. The larger caliber third obtuse marginal branch has severe stenosis.  The RCA is a large dominant vessel with patent stents from the proximal to the mid vessel. The long stented segment is patent with moderate restenosis.  Normal LV systolic function  Recommendations: He has progression of three vessel CAD since cath in 2018. The LAD lesions were flow limiting by FFR in 2018. He seems to be having accelerated angina despite medical therapy. I will ask Dr. Acie Fredrickson to add Ranexa. I think we can consider CABG. We can make an outpatient referral to CT surgery to discuss options for CABG.     Indications  Coronary artery disease of native artery of native heart with stable angina pectoris ([place]) [I25.118 (ICD-10-CM)]   Procedural Details  Technical Details Indication: Known severe three vessel CAD by cath in 2018. CABG recommended at that time but he has done well with medical therapy alone. Now with recent acceleration of angina.   Procedure: The risks, benefits, complications, treatment options, and expected outcomes were discussed with the patient. The patient and/or family concurred with the proposed plan, giving informed consent. The  patient was brought to the cath lab after IV hydration was given. The patient was further sedated with Versed and Fentanyl. The right wrist was prepped and draped in a sterile fashion. 1% lidocaine was used for local anesthesia. Using the modified Seldinger access technique, a 5 French sheath was placed in the right radial artery. 3 mg Verapamil was given through the sheath. 4500 units IV heparin was given. Standard diagnostic catheters were used to perform selective coronary angiography. A pigtail catheter was used to perform a left ventricular angiogram. The sheath was removed from the right radial artery and a Terumo hemostasis band was applied at the arteriotomy site on the right wrist.    Estimated blood loss <50 mL.   During this procedure medications were administered to achieve and maintain moderate conscious sedation while the patient's heart rate, blood pressure, and oxygen saturation were continuously monitored and I was present face-to-face 100% of this time.   Medications (Filter: Administrations occurring from [id number] to 1028 on 07/12/20)  fentaNYL (SUBLIMAZE) injection (mcg) Total dose:  50 mcg  Date/Time Rate/Dose/Volume Action   07/12/20 0942 50 mcg Given    midazolam (VERSED) injection (mg) Total dose:  2 mg  Date/Time Rate/Dose/Volume Action   07/12/20 0942 2 mg Given    lidocaine (PF) (XYLOCAINE) 1 % injection (mL) Total volume:  2 mL  Date/Time Rate/Dose/Volume Action  07/12/20 0944 2 mL Given    Radial Cocktail/Verapamil only (mL) Total volume:  10 mL  Date/Time Rate/Dose/Volume Action   07/12/20 0945 10 mL Given    heparin sodium (porcine) injection (Units) Total dose:  4,500 Units  Date/Time Rate/Dose/Volume Action   07/12/20 0946 4,500 Units Given    Heparin (Porcine) in NaCl 1000-0.9 UT/500ML-% SOLN (mL) Total volume:  1,000 mL  Date/Time Rate/Dose/Volume Action   07/12/20 1001 500 mL Given   1002 500 mL Given     iohexol (OMNIPAQUE) 350 MG/ML injection (mL) Total volume:  100 mL  Date/Time Rate/Dose/Volume Action   07/12/20 1005 100 mL Given     Sedation Time  Sedation Time Physician-1: 24 minutes 51 seconds   Contrast  Medication Name Total Dose  iohexol (OMNIPAQUE) 350 MG/ML injection 100 mL    Radiation/Fluoro  Fluoro time: 7.1 (min) DAP: 23.4 (Gycm2) Cumulative Air Kerma: 749.4 (mGy)   Complications   Complications documented before study signed (07/12/2020 10:36 AM)     LEFT HEART CATH AND CORONARY ANGIOGRAPHY  None Documented by Burnell Blanks, MD 07/12/2020 10:19 AM  Date Found: 07/12/2020  Time Range: Intraprocedure       Coronary Findings   Diagnostic Dominance: Right  Left Main  Vessel is large. Vessel is angiographically normal.  Left Anterior Descending  Vessel is large.  Ost LAD lesion is 20% stenosed. The lesion is eccentric. The lesion is calcified.  Prox LAD lesion is 65% stenosed. The lesion is eccentric. The lesion is moderately calcified.  Mid LAD lesion is 70% stenosed. The lesion is concentric.  Dist LAD-1 lesion is 70% stenosed.  Dist LAD-2 lesion is 70% stenosed.  First Diagonal Branch  Vessel is small in size.  Ost 1st Diag lesion is 50% stenosed.  Second Diagonal Branch  Vessel is small in size.  Third Diagonal Branch  Vessel is small in size.  Ramus Intermedius  Vessel is small. There is mild diffuse disease throughout the vessel.  Left Circumflex  Vessel is moderate in size.  Mid Cx lesion is 60% stenosed.  First Obtuse Marginal Branch  Vessel is small in size.  1st Mrg lesion is 90% stenosed.  Second Obtuse Marginal Branch  Vessel is small in size.  2nd Mrg lesion is 90% stenosed.  Third Obtuse Marginal Branch  Vessel is large in size.  3rd Mrg lesion is 90% stenosed.  Right Coronary Artery  Vessel is moderate in size.  Ost RCA lesion is 40% stenosed. The lesion is eccentric. The  lesion is calcified.  Mid RCA lesion is 60% stenosed. The lesion was previously treated using a drug eluting stent over 2 years ago.  Mid RCA to Dist RCA lesion is 40% stenosed. The lesion is focal. The lesion was previously treated.  Right Posterior Descending Artery  Vessel is moderate in size. There is mild disease in the vessel.  Right Posterior Atrioventricular Artery  Vessel is moderate in size. There is mild disease in the vessel.   Intervention   No interventions have been documented.  Wall Motion       Resting       All segments of the heart are normal.           Left Heart  Left Ventricle The left ventricular size is normal. The left ventricular systolic function is normal. LV end diastolic pressure is normal. The left ventricular ejection fraction is 55-65% by visual estimate. No regional wall motion abnormalities. There is no evidence of  mitral regurgitation.   Coronary Diagrams   Diagnostic Dominance: Right    Intervention    Implants       No implant documentation for this case.   Syngo Images  Show images for CARDIAC CATHETERIZATION  Images on Long Term Storage  Show images for Marinus, [place] to Procedure Log  Procedure Log     Hemo Data  Flowsheet Row Most Recent Value  AO Systolic Pressure 95 mmHg  AO Diastolic Pressure 61 mmHg  AO Mean 77 mmHg  LV Systolic Pressure 423 mmHg  LV Diastolic Pressure 14 mmHg  LV EDP 22 mmHg  AOp Systolic Pressure 536 mmHg  AOp Diastolic Pressure 0 mmHg  AOp Mean Pressure 23 mmHg  LVp Systolic Pressure 95 mmHg  LVp Diastolic Pressure 12 mmHg  LVp EDP Pressure 21 mmHg     Impression:  This 70 year old gentleman has severe multivessel coronary disease with diffusely diseased and calcified vessels.  He had an abnormal FFR in the LAD by catheterization in 2018.  He has progressively worsening anginal symptoms that are limiting his lifestyle and ability to  remain active.  I agree that coronary artery bypass graft surgery is the best treatment.    His preoperative Doppler examination shows that the left upper extremity is dependent on the radial and ulnar arteries and therefore left radial artery bypass cannot be used.  The right radial artery was used for catheterization and therefore I would not use that artery for grafting. I would not use bilateral internal mammary artery grafts in this patient due to his history of diabetes and remote smoking with moderate emphysematous lung changes on previous chest CT in 2013.  His carotid Doppler examination showed a 40 to 59% right ICA stenosis and 60 to 79% left ICA stenosis.  There is greater than 50% left common carotid artery stenosis.  The patient is asymptomatic and does not require treatment for these at this time but will require vascular surgery follow-up.  I discussed the operative procedure with the patient and his wife including alternatives, benefits and risks; including but not limited to bleeding, blood transfusion, infection, stroke, myocardial infarction, graft failure, heart block requiring a permanent pacemaker, organ dysfunction, and death.  [name] understands and agrees to proceed.    Plan:  Coronary bypass graft surgery.  Gaye Pollack, MD [facility] [phone]

## 2020-07-18 NOTE — Progress Notes (Signed)
PCP - Dr. Hetty Blend, Jerline Pain Cardiologist - Dr. Acie Fredrickson, Arnette Norris  PPM/ICD - pacemaker ([place]) Device Orders - yes.   Have magnet available.  Provide continuous ECG monitoring when magnet is used or reprogramming is to be performed.   Procedure will likely interfere with device function.  Device should be programmed:  Asynchronous pacing during procedure and returned to normal programming after procedure Rep. Notified: yes. Mickel Baas called back and confirmed that she will be present for surgery tomorrow.  Chest x-ray - 07/18/2020 EKG - 07/05/2020 Stress Test - 10/25/2012 ECHO - 07/18/2020 Cardiac Cath - 07/12/2020  Sleep Study - yes (07/19/2014) positive CPAP - yes  Blood Thinner Instructions:  Patient is not current on Plavix. Per patient, Plavix was D/C one year ago. Aspirin Instructions: Patient was instructed to follow his surgeon's instructions on when to stop Aspirin.  If no instructions were given by his surgeon then patient will need to call the office to get those instructions.  Patient was instructed to STOP taking any Aspirin (unless otherwise instructed by your surgeon) Aleve, Naproxen, Ibuprofen, Motrin, Advil, Goody's, BC's, all herbal medications, fish oil, and all vitamins  ERAS Protcol - no  COVID TEST- 07/18/2020   Anesthesia review: Ebony Hail PA-C reviewed patient's chart during PAT appointment.   Patient denies shortness of breath, fever, cough and chest pain at PAT appointment   All instructions explained to the patient, with a verbal understanding of the material. Patient agrees to go over the instructions while at home for a better understanding. Patient also instructed to self quarantine after being tested for COVID-19. The opportunity to ask questions was provided.

## 2020-07-18 NOTE — Anesthesia Preprocedure Evaluation (Addendum)
Anesthesia Evaluation  Patient identified by MRN, date of birth, ID band Patient awake    Reviewed: Allergy & Precautions, NPO status , Patient's Chart, lab work & pertinent test results  Airway Mallampati: II  TM Distance: >3 FB Neck ROM: Full    Dental  (+) Missing,    Pulmonary sleep apnea and Continuous Positive Airway Pressure Ventilation , COPD,  COPD inhaler, former smoker,    Pulmonary exam normal breath sounds clear to auscultation       Cardiovascular hypertension, Pt. on home beta blockers + angina + CAD, + Past MI and + Cardiac Stents  Normal cardiovascular exam+ pacemaker  Rhythm:Regular Rate:Normal  ECHO: 1. Left ventricular ejection fraction, by estimation, is 55 to 60%. The left ventricle has normal function. The left ventricle has no regional  wall motion abnormalities. There is mild left ventricular hypertrophy.  Left ventricular diastolic parameters are indeterminate.  2. Right ventricular systolic function is normal. The right ventricular size is normal. Mildly increased right ventricular wall thickness.  3. The mitral valve is abnormal. Trivial mitral valve regurgitation.  4. The aortic valve is normal in structure. Aortic valve regurgitation is not visualized.  5. The inferior vena cava is normal in size with greater than 50% respiratory variability, suggesting right atrial pressure of 3 mmHg.   CATH: Severe three vessel CAD The LAD has severe stenotic lesions in the mid and distal vessel. Diffuse disease throughout The Circumflex has a moderately severe mid stenosis. The first two OM branches are small with severe proximal disease. The larger caliber third  obtuse marginal branch has severe stenosis.  The RCA is a large dominant vessel with patent stents from the proximal to the mid vessel. The long stented segment is patent with moderate restenosis.  Normal LV systolic function   Neuro/Psych   Headaches, negative psych ROS   GI/Hepatic Neg liver ROS, hiatal hernia, GERD  Medicated and Controlled,  Endo/Other  negative endocrine ROS  Renal/GU negative Renal ROS     Musculoskeletal  (+) Arthritis ,   Abdominal   Peds  Hematology HLD   Anesthesia Other Findings CAD  Reproductive/Obstetrics                          Anesthesia Physical Anesthesia Plan  ASA: IV  Anesthesia Plan: General   Post-op Pain Management:    Induction: Intravenous  PONV Risk Score and Plan: 2 and Ondansetron, Dexamethasone, Midazolam and Treatment may vary due to age or medical condition  Airway Management Planned: Oral ETT  Additional Equipment: Arterial line, CVP, PA Cath, TEE and Ultrasound Guidance Line Placement  Intra-op Plan:   Post-operative Plan: Post-operative intubation/ventilation  Informed Consent: I have reviewed the patients History and Physical, chart, labs and discussed the procedure including the risks, benefits and alternatives for the proposed anesthesia with the patient or authorized representative who has indicated his/her understanding and acceptance.     Dental advisory given  Plan Discussed with: CRNA  Anesthesia Plan Comments: (Reviewed PAT note written 07/18/2020 by Myra Gianotti, PA-C. A1c, COVID-19 test, echocardiogram in process as of 4:45 PM 07/18/20. )      Anesthesia Quick Evaluation

## 2020-07-18 NOTE — Progress Notes (Signed)
Anesthesia Chart Review:  Case: [id number] Date/Time: 07/19/20 0730   Procedures:      CORONARY ARTERY BYPASS GRAFTING (CABG) (N/A Chest)     RADIAL ARTERY HARVEST (Left Arm Lower)     TRANSESOPHAGEAL ECHOCARDIOGRAM (TEE) (N/A )   Anesthesia type: General   Pre-op diagnosis: CAD   Location: [facility] OR ROOM 59 / [facility]   Surgeons: Gaye Pollack, MD      DISCUSSION: Patient is a 71 year old male scheduled for the above procedure.   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 Prior to PPM insertion, he had two episodes of bradycardia after anesthesia (endoscopy; back surgery 04/2018). 07/18/20 carotid US showed 40-59% RICA and 45-36% LICA stenosis.    Device Information: Device Type:  Estate manager/land agent and Phone #:  [employer]: [phone] Pacemaker Dependent?:  Yes.   Date of Last Device Check:  06/25/20 Normal Device Function?:  Yes.    Electrophysiologist's Recommendations:  Have magnet available.  Provide continuous ECG monitoring when magnet is used or reprogramming is to be performed.   Procedure will likely interfere with device function.  Device should be programmed:  Asynchronous pacing during procedure and returned to normal programming after procedure   07/18/20 presurgical COVID-19 test, echo, and A1c results are still in process as of 4:45 PM. Anesthesia team to evaluate patient/review results on the day of surgery.    VS: BP (!) 154/77   Pulse 71   Temp 36.5 C (Oral)   Resp 18   Ht 5\' 10"  (1.778 m)   Wt 87 kg   SpO2 98%   BMI 27.52 kg/m    PROVIDERS: Vivi Barrack, MD is PCP Mertie Moores, MD is Cardiologist [name], MD is  EP Cardiologist Fransico Him, MD is OSA Cardiologist Christinia Gully, MD is Pulmonologist. Last visit 01/15/17. Philemon Kingdom, MD is Endocrinologist   LABS: Preoperative labs noted. A1c is in process.  (all labs ordered are listed, but only abnormal results are displayed)  Labs Reviewed  COMPREHENSIVE METABOLIC PANEL - Abnormal; Notable for the following components:      Result Value   Sodium 133 (*)    Glucose, Bld 171 (*)    All other components within normal limits  BLOOD GAS, ARTERIAL - Abnormal; Notable for the following components:   Allens test (pass/fail) BRACHIAL ARTERY (*)    All other components within normal limits  SURGICAL PCR SCREEN  CBC  PROTIME-INR  APTT  URINALYSIS, ROUTINE W REFLEX MICROSCOPIC  HEMOGLOBIN A1C  TYPE AND SCREEN    PFT's  01/15/2017 "FEV1 1.60  (46 % ) ratio 56  p 22 % improvement from saba p nothing prior to study with DLCO  46/49  % corrects to 69  % for alv volume"   IMAGES: CXR 07/18/20:  IMPRESSION: Chronic lung changes without evidence of acute cardiopulmonary disease.    EKG: 07/05/20: SR, v-paced   CV: Echo 07/18/20: In process.   Carotid US 07/18/20: Summary:  - Right Carotid: Velocities in the right ICA are consistent with a 40-59% stenosis.  - Left Carotid: Velocities in the left ICA are consistent with a 60-79% stenosis. Hemodynamically significant plaque >50% visualized in the CCA. Left CCA/ICA  has a hemodynamically significant stenosis by both grey scale/color and PW doppler. Ratio is.  - Vertebrals: Bilateral vertebral arteries demonstrate antegrade flow.  - Subclavians: Normal flow hemodynamics were seen in bilateral subclavian arteries.    Cardiac cath 07/12/20:  Colon Flattery 1st Diag lesion is 50% stenosed.  Prox LAD lesion is 65% stenosed.  Mid LAD lesion is 70% stenosed.  Dist LAD-1 lesion is 70% stenosed.  Dist LAD-2 lesion is 70% stenosed.  Ost LAD lesion is 20% stenosed.  1st Mrg lesion is 90%  stenosed.  2nd Mrg lesion is 90% stenosed.  3rd Mrg lesion is 90% stenosed.  Mid Cx lesion is 60% stenosed.  Mid RCA to Dist RCA lesion is 40% stenosed.  Ost RCA lesion is 40% stenosed.  Mid RCA lesion is 60% stenosed.  The left ventricular systolic function is normal.  LV end diastolic pressure is normal.  The left ventricular ejection fraction is 55-65% by visual estimate.  There is no mitral valve regurgitation.   Severe three vessel CAD The LAD has severe stenotic lesions in the mid and distal vessel. Diffuse disease throughout The Circumflex has a moderately severe mid stenosis. The first two OM branches are small with severe proximal disease. The larger caliber third obtuse marginal branch has severe stenosis.  The RCA is a large dominant vessel with patent stents from the proximal to the mid vessel. The long stented segment is patent with moderate restenosis.  Normal LV systolic function  Recommendations: He has progression of three vessel CAD since cath in 2018. The LAD lesions were flow limiting by FFR in 2018. He seems to be having accelerated angina despite medical therapy. I will ask Dr. Acie Fredrickson to add Ranexa. I think we can consider CABG. We can make an outpatient referral to CT surgery to discuss options for CABG.     Past Medical History:  Diagnosis Date  . Arthritis    left wrist  . 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 CAD (coronary artery disease)    s/p atherectomy, PTCA/DES x2 mid & distal RCA 06/30/12  . Chest pain   . Complication of anesthesia    heart rate dropping with propofol last procedure  . COPD (chronic obstructive pulmonary disease) ([place])   . Dyspnea   . ED (erectile dysfunction)   . Esophageal stricture   . External hemorrhoids without mention of complication   . GERD (gastroesophageal reflux disease)   . Headache(784.0)   . Hiatal hernia   . History of kidney stones   . HLD (hyperlipidemia)   . HTN (hypertension)   .  Myocardial infarction ([place])    1996  . Personal history of colonic polyps 03/12/2010   TUBULAR ADENOMA  . Presence of permanent cardiac pacemaker   . Tobacco abuse   . Tubular adenoma of colon     Past Surgical History:  Procedure Laterality Date  . ANGIOPLASTY     X3  . COLONOSCOPY N/A 02/16/2017   Procedure: COLONOSCOPY;  Surgeon: Jerene Bears, MD;  Location: [facility];  Service: Gastroenterology;  Laterality: N/A;  . INSERT / REPLACE / REMOVE PACEMAKER  06/20/2018  . LEFT HEART CATH AND CORONARY ANGIOGRAPHY N/A 11/05/2016   Procedure: Left Heart Cath and Coronary Angiography;  Surgeon: Nelva Bush, MD;  Location: [facility] LAB;  Service: Cardiovascular;  Laterality: N/A;  . LEFT HEART CATH AND CORONARY ANGIOGRAPHY N/A 07/12/2020   Procedure: LEFT HEART CATH AND CORONARY ANGIOGRAPHY;  Surgeon: Burnell Blanks, MD;  Location: [facility] CV LAB;  Service: Cardiovascular;  Laterality: N/A;  . LUMBAR LAMINECTOMY/DECOMPRESSION MICRODISCECTOMY N/A 05/12/2018   Procedure: Microlumbar decompression L2-3, possible L1-2;  Surgeon: Susa Day, MD;  Location: [facility];  Service: Orthopedics;  Laterality: N/A;  2 hrs, Microlumbar decompression L2-3, possible L1-2  . PACEMAKER IMPLANT N/A 06/20/2018   Procedure: PACEMAKER IMPLANT;  Surgeon: Evans Lance, MD;  Location: [place] CV LAB;  Service: Cardiovascular;  Laterality: N/A;  . PERCUTANEOUS CORONARY ROTOBLATOR INTERVENTION (PCI-R) N/A 06/30/2012   Procedure: PERCUTANEOUS CORONARY ROTOBLATOR INTERVENTION (PCI-R);  Surgeon: Burnell Blanks, MD;  Location: [facility] CATH LAB;  Service: Cardiovascular;  Laterality: N/A;  . VIDEO BRONCHOSCOPY  08/13/2011   Procedure: VIDEO BRONCHOSCOPY WITHOUT FLUORO;  Surgeon: Tanda Rockers, MD;  Location: [facility] END[facility];  Service: Cardiopulmonary;  Laterality: Bilateral;    MEDICATIONS: . acetaminophen (TYLENOL) 500 MG tablet  . albuterol (VENTOLIN HFA) 108 (90 Base) MCG/ACT inhaler  .  aspirin EC 81 MG tablet  . Choline Fenofibrate (FENOFIBRIC ACID) 135 MG CPDR  . famotidine (PEPCID) 20 MG tablet  . hydrochlorothiazide (HYDRODIURIL) 25 MG tablet  . isosorbide mononitrate (IMDUR) 60 MG 24 hr tablet  . metoprolol tartrate (LOPRESSOR) 25 MG tablet  . niacin 500 MG tablet  . nitroGLYCERIN (NITROSTAT) 0.4 MG SL tablet  . omeprazole (PRILOSEC) 20 MG capsule  . RABEprazole (ACIPHEX) 20 MG tablet  . ranolazine (RANEXA) 500 MG 12 hr tablet  . rosuvastatin (CRESTOR) 20 MG tablet   No current facility-administered medications for this encounter.   Derrill Memo ON 07/19/2020] cefUROXime (ZINACEF) 1.5 g in sodium chloride 0.9 % 100 mL IVPB  . [START ON 07/19/2020] cefUROXime (ZINACEF) 750 mg in sodium chloride 0.9 % 100 mL IVPB  . [START ON 07/19/2020] dexmedetomidine (PRECEDEX) 400 MCG/100ML (4 mcg/mL) infusion  . [START ON 07/19/2020] EPINEPHrine (ADRENALIN) 4 mg in NS 250 mL (0.016 mg/mL) premix infusion  . [START ON 07/19/2020] heparin 30,000 units/NS 1000 mL solution for CELLSAVER  . [START ON 07/19/2020] heparin sodium (porcine) 2,500 Units, papaverine 30 mg in electrolyte-148 (PLASMALYTE-148) 500 mL irrigation  . [START ON 07/19/2020] insulin regular, human (MYXREDLIN) 100 units/ 100 mL infusion  . [START ON 07/19/2020] magnesium sulfate (IV Push/IM) injection 40 mEq  . [START ON 07/19/2020] milrinone (PRIMACOR) 20 MG/100 ML (0.2 mg/mL) infusion  . [START ON 07/19/2020] nitroGLYCERIN 50 mg in dextrose 5 % 250 mL (0.2 mg/mL) infusion  . [START ON 07/19/2020] norepinephrine (LEVOPHED) 4mg  in 244mL premix infusion  . [START ON 07/19/2020] phenylephrine (NEOSYNEPHRINE) 20-0.9 MG/250ML-% infusion  . [START ON 07/19/2020] potassium chloride injection 80 mEq  . [START ON 07/19/2020] tranexamic acid (CYKLOKAPRON) 2,500 mg in sodium chloride 0.9 % 250 mL (10 mg/mL) infusion  . [START ON 07/19/2020] tranexamic acid (CYKLOKAPRON) bolus via infusion - over 30 minutes 1,327.5 mg  . [START ON 07/19/2020] tranexamic acid  (CYKLOKAPRON) pump prime solution 177 mg  . [START ON 07/19/2020] vancomycin (VANCOREADY) IVPB 1500 mg/300 mL    Myra Gianotti, PA-C Surgical Short Stay/Anesthesiology [facility] Phone [phone] [facility] Phone [phone] 07/18/2020 4:46 PM

## 2020-07-18 NOTE — Progress Notes (Signed)
  Echocardiogram 2D Echocardiogram has been performed.  Merrie Roof F 07/18/2020, 11:07 AM

## 2020-07-18 NOTE — Progress Notes (Signed)
Pre-CABG vascular work-up completed.  Results given to [facility].   Please see CV Proc for preliminary results.   Daniel Carey, RVT

## 2020-07-19 ENCOUNTER — Inpatient Hospital Stay (HOSPITAL_COMMUNITY): Payer: BC Managed Care – PPO | Admitting: Certified Registered Nurse Anesthetist

## 2020-07-19 ENCOUNTER — Inpatient Hospital Stay (HOSPITAL_COMMUNITY): Payer: BC Managed Care – PPO

## 2020-07-19 ENCOUNTER — Inpatient Hospital Stay (HOSPITAL_COMMUNITY): Payer: BC Managed Care – PPO | Admitting: Vascular Surgery

## 2020-07-19 ENCOUNTER — Other Ambulatory Visit: Payer: Self-pay

## 2020-07-19 ENCOUNTER — Inpatient Hospital Stay (HOSPITAL_COMMUNITY)
Admission: RE | Admit: 2020-07-19 | Discharge: 2020-07-24 | DRG: 236 | Disposition: A | Discharge status: Home / Self Care | Payer: BC Managed Care – PPO | Attending: Surgery | Admitting: Surgery

## 2020-07-19 ENCOUNTER — Encounter (HOSPITAL_COMMUNITY): Payer: Self-pay | Admitting: Surgery

## 2020-07-19 ENCOUNTER — Inpatient Hospital Stay (HOSPITAL_COMMUNITY)
Admission: RE | Disposition: A | Discharge status: Home / Self Care | Payer: Self-pay | Source: Home / Self Care | Attending: Surgery

## 2020-07-19 DIAGNOSIS — Z20822 Contact with and (suspected) exposure to covid-19: Secondary | ICD-10-CM | POA: Diagnosis present

## 2020-07-19 DIAGNOSIS — Z833 Family history of diabetes mellitus: Secondary | ICD-10-CM

## 2020-07-19 DIAGNOSIS — I25118 Atherosclerotic heart disease of native coronary artery with other forms of angina pectoris: Secondary | ICD-10-CM | POA: Diagnosis present

## 2020-07-19 DIAGNOSIS — E1165 Type 2 diabetes mellitus with hyperglycemia: Secondary | ICD-10-CM | POA: Diagnosis present

## 2020-07-19 DIAGNOSIS — E785 Hyperlipidemia, unspecified: Secondary | ICD-10-CM | POA: Diagnosis present

## 2020-07-19 DIAGNOSIS — Z87891 Personal history of nicotine dependence: Secondary | ICD-10-CM

## 2020-07-19 DIAGNOSIS — I10 Essential (primary) hypertension: Secondary | ICD-10-CM | POA: Diagnosis present

## 2020-07-19 DIAGNOSIS — K219 Gastro-esophageal reflux disease without esophagitis: Secondary | ICD-10-CM | POA: Diagnosis present

## 2020-07-19 DIAGNOSIS — D62 Acute posthemorrhagic anemia: Secondary | ICD-10-CM | POA: Diagnosis not present

## 2020-07-19 DIAGNOSIS — I2510 Atherosclerotic heart disease of native coronary artery without angina pectoris: Secondary | ICD-10-CM

## 2020-07-19 DIAGNOSIS — T82855A Stenosis of coronary artery stent, initial encounter: Secondary | ICD-10-CM | POA: Diagnosis present

## 2020-07-19 DIAGNOSIS — I252 Old myocardial infarction: Secondary | ICD-10-CM | POA: Diagnosis not present

## 2020-07-19 DIAGNOSIS — Z8249 Family history of ischemic heart disease and other diseases of the circulatory system: Secondary | ICD-10-CM

## 2020-07-19 DIAGNOSIS — Z950 Presence of cardiac pacemaker: Secondary | ICD-10-CM

## 2020-07-19 DIAGNOSIS — J449 Chronic obstructive pulmonary disease, unspecified: Secondary | ICD-10-CM | POA: Diagnosis present

## 2020-07-19 DIAGNOSIS — Z7984 Long term (current) use of oral hypoglycemic drugs: Secondary | ICD-10-CM

## 2020-07-19 DIAGNOSIS — Y831 Surgical operation with implant of artificial internal device as the cause of abnormal reaction of the patient, or of later complication, without mention of misadventure at the time of the procedure: Secondary | ICD-10-CM | POA: Diagnosis present

## 2020-07-19 DIAGNOSIS — Z736 Limitation of activities due to disability: Secondary | ICD-10-CM | POA: Diagnosis not present

## 2020-07-19 DIAGNOSIS — Z951 Presence of aortocoronary bypass graft: Secondary | ICD-10-CM

## 2020-07-19 HISTORY — PX: CORONARY ARTERY BYPASS GRAFT: SHX141

## 2020-07-19 HISTORY — PX: TEE WITHOUT CARDIOVERSION: SHX5443

## 2020-07-19 LAB — POCT I-STAT 7, (LYTES, BLD GAS, ICA,H+H)
Acid-Base Excess: 0 mmol/L (ref 0.0–2.0)
Acid-Base Excess: 3 mmol/L — ABNORMAL HIGH (ref 0.0–2.0)
Acid-Base Excess: 6 mmol/L — ABNORMAL HIGH (ref 0.0–2.0)
Acid-Base Excess: 2 mmol/L (ref 0.0–2.0)
Acid-base deficit: 1 mmol/L (ref 0.0–2.0)
Acid-base deficit: 2 mmol/L (ref 0.0–2.0)
Bicarbonate: 27.5 mmol/L (ref 20.0–28.0)
Bicarbonate: 29.7 mmol/L — ABNORMAL HIGH (ref 20.0–28.0)
Bicarbonate: 31.2 mmol/L — ABNORMAL HIGH (ref 20.0–28.0)
Bicarbonate: 27.5 mmol/L (ref 20.0–28.0)
Bicarbonate: 25.8 mmol/L (ref 20.0–28.0)
Bicarbonate: 23.9 mmol/L (ref 20.0–28.0)
Calcium, Ion: 1.3 mmol/L (ref 1.15–1.40)
Calcium, Ion: 1.01 mmol/L — ABNORMAL LOW (ref 1.15–1.40)
Calcium, Ion: 1.04 mmol/L — ABNORMAL LOW (ref 1.15–1.40)
Calcium, Ion: 1.05 mmol/L — ABNORMAL LOW (ref 1.15–1.40)
Calcium, Ion: 1.12 mmol/L — ABNORMAL LOW (ref 1.15–1.40)
Calcium, Ion: 1.16 mmol/L (ref 1.15–1.40)
HCT: 38 % — ABNORMAL LOW (ref 39.0–52.0)
HCT: 26 % — ABNORMAL LOW (ref 39.0–52.0)
HCT: 27 % — ABNORMAL LOW (ref 39.0–52.0)
HCT: 25 % — ABNORMAL LOW (ref 39.0–52.0)
HCT: 32 % — ABNORMAL LOW (ref 39.0–52.0)
HCT: 34 % — ABNORMAL LOW (ref 39.0–52.0)
Hemoglobin: 12.9 g/dL — ABNORMAL LOW (ref 13.0–17.0)
Hemoglobin: 8.8 g/dL — ABNORMAL LOW (ref 13.0–17.0)
Hemoglobin: 9.2 g/dL — ABNORMAL LOW (ref 13.0–17.0)
Hemoglobin: 8.5 g/dL — ABNORMAL LOW (ref 13.0–17.0)
Hemoglobin: 10.9 g/dL — ABNORMAL LOW (ref 13.0–17.0)
Hemoglobin: 11.6 g/dL — ABNORMAL LOW (ref 13.0–17.0)
O2 Saturation: 100 %
O2 Saturation: 100 %
O2 Saturation: 82 %
O2 Saturation: 100 %
O2 Saturation: 97 %
O2 Saturation: 96 %
Patient temperature: 36.4
Patient temperature: 36.2
Potassium: 4.1 mmol/L (ref 3.5–5.1)
Potassium: 4.3 mmol/L (ref 3.5–5.1)
Potassium: 4.6 mmol/L (ref 3.5–5.1)
Potassium: 4.6 mmol/L (ref 3.5–5.1)
Potassium: 4.2 mmol/L (ref 3.5–5.1)
Potassium: 4.6 mmol/L (ref 3.5–5.1)
Sodium: 136 mmol/L (ref 135–145)
Sodium: 133 mmol/L — ABNORMAL LOW (ref 135–145)
Sodium: 136 mmol/L (ref 135–145)
Sodium: 138 mmol/L (ref 135–145)
Sodium: 139 mmol/L (ref 135–145)
Sodium: 138 mmol/L (ref 135–145)
TCO2: 29 mmol/L (ref 22–32)
TCO2: 31 mmol/L (ref 22–32)
TCO2: 33 mmol/L — ABNORMAL HIGH (ref 22–32)
TCO2: 29 mmol/L (ref 22–32)
TCO2: 27 mmol/L (ref 22–32)
TCO2: 25 mmol/L (ref 22–32)
pCO2 arterial: 53.5 mmHg — ABNORMAL HIGH (ref 32.0–48.0)
pCO2 arterial: 50.7 mmHg — ABNORMAL HIGH (ref 32.0–48.0)
pCO2 arterial: 54.4 mmHg — ABNORMAL HIGH (ref 32.0–48.0)
pCO2 arterial: 45.7 mmHg (ref 32.0–48.0)
pCO2 arterial: 50.3 mmHg — ABNORMAL HIGH (ref 32.0–48.0)
pCO2 arterial: 43.7 mmHg (ref 32.0–48.0)
pH, Arterial: 7.319 — ABNORMAL LOW (ref 7.350–7.450)
pH, Arterial: 7.345 — ABNORMAL LOW (ref 7.350–7.450)
pH, Arterial: 7.396 (ref 7.350–7.450)
pH, Arterial: 7.388 (ref 7.350–7.450)
pH, Arterial: 7.315 — ABNORMAL LOW (ref 7.350–7.450)
pH, Arterial: 7.342 — ABNORMAL LOW (ref 7.350–7.450)
pO2, Arterial: 499 mmHg — ABNORMAL HIGH (ref 83.0–108.0)
pO2, Arterial: 448 mmHg — ABNORMAL HIGH (ref 83.0–108.0)
pO2, Arterial: 50 mmHg — ABNORMAL LOW (ref 83.0–108.0)
pO2, Arterial: 317 mmHg — ABNORMAL HIGH (ref 83.0–108.0)
pO2, Arterial: 92 mmHg (ref 83.0–108.0)
pO2, Arterial: 81 mmHg — ABNORMAL LOW (ref 83.0–108.0)

## 2020-07-19 LAB — POCT I-STAT, CHEM 8
BUN: 22 mg/dL (ref 8–23)
BUN: 20 mg/dL (ref 8–23)
BUN: 21 mg/dL (ref 8–23)
BUN: 19 mg/dL (ref 8–23)
BUN: 19 mg/dL (ref 8–23)
Calcium, Ion: 1.35 mmol/L (ref 1.15–1.40)
Calcium, Ion: 1.02 mmol/L — ABNORMAL LOW (ref 1.15–1.40)
Calcium, Ion: 1.32 mmol/L (ref 1.15–1.40)
Calcium, Ion: 1.05 mmol/L — ABNORMAL LOW (ref 1.15–1.40)
Calcium, Ion: 1.02 mmol/L — ABNORMAL LOW (ref 1.15–1.40)
Chloride: 100 mmol/L (ref 98–111)
Chloride: 101 mmol/L (ref 98–111)
Chloride: 94 mmol/L — ABNORMAL LOW (ref 98–111)
Chloride: 100 mmol/L (ref 98–111)
Chloride: 98 mmol/L (ref 98–111)
Creatinine, Ser: 1.1 mg/dL (ref 0.61–1.24)
Creatinine, Ser: 0.9 mg/dL (ref 0.61–1.24)
Creatinine, Ser: 1.1 mg/dL (ref 0.61–1.24)
Creatinine, Ser: 0.9 mg/dL (ref 0.61–1.24)
Creatinine, Ser: 0.9 mg/dL (ref 0.61–1.24)
Glucose, Bld: 146 mg/dL — ABNORMAL HIGH (ref 70–99)
Glucose, Bld: 118 mg/dL — ABNORMAL HIGH (ref 70–99)
Glucose, Bld: 95 mg/dL (ref 70–99)
Glucose, Bld: 96 mg/dL (ref 70–99)
Glucose, Bld: 121 mg/dL — ABNORMAL HIGH (ref 70–99)
HCT: 36 % — ABNORMAL LOW (ref 39.0–52.0)
HCT: 26 % — ABNORMAL LOW (ref 39.0–52.0)
HCT: 33 % — ABNORMAL LOW (ref 39.0–52.0)
HCT: 24 % — ABNORMAL LOW (ref 39.0–52.0)
HCT: 25 % — ABNORMAL LOW (ref 39.0–52.0)
Hemoglobin: 12.2 g/dL — ABNORMAL LOW (ref 13.0–17.0)
Hemoglobin: 11.2 g/dL — ABNORMAL LOW (ref 13.0–17.0)
Hemoglobin: 8.8 g/dL — ABNORMAL LOW (ref 13.0–17.0)
Hemoglobin: 8.2 g/dL — ABNORMAL LOW (ref 13.0–17.0)
Hemoglobin: 8.5 g/dL — ABNORMAL LOW (ref 13.0–17.0)
Potassium: 4.1 mmol/L (ref 3.5–5.1)
Potassium: 4.3 mmol/L (ref 3.5–5.1)
Potassium: 4.4 mmol/L (ref 3.5–5.1)
Potassium: 6.8 mmol/L (ref 3.5–5.1)
Potassium: 4.6 mmol/L (ref 3.5–5.1)
Sodium: 137 mmol/L (ref 135–145)
Sodium: 134 mmol/L — ABNORMAL LOW (ref 135–145)
Sodium: 137 mmol/L (ref 135–145)
Sodium: 133 mmol/L — ABNORMAL LOW (ref 135–145)
Sodium: 138 mmol/L (ref 135–145)
TCO2: 28 mmol/L (ref 22–32)
TCO2: 28 mmol/L (ref 22–32)
TCO2: 31 mmol/L (ref 22–32)
TCO2: 32 mmol/L (ref 22–32)
TCO2: 29 mmol/L (ref 22–32)

## 2020-07-19 LAB — MAGNESIUM: Magnesium: 2.8 mg/dL — ABNORMAL HIGH (ref 1.7–2.4)

## 2020-07-19 LAB — CBC
HCT: 33.3 % — ABNORMAL LOW (ref 39.0–52.0)
HCT: 34.7 % — ABNORMAL LOW (ref 39.0–52.0)
Hemoglobin: 11.3 g/dL — ABNORMAL LOW (ref 13.0–17.0)
Hemoglobin: 11.8 g/dL — ABNORMAL LOW (ref 13.0–17.0)
MCH: 31.3 pg (ref 26.0–34.0)
MCH: 31.1 pg (ref 26.0–34.0)
MCHC: 33.9 g/dL (ref 30.0–36.0)
MCHC: 34 g/dL (ref 30.0–36.0)
MCV: 92.2 fL (ref 80.0–100.0)
MCV: 91.3 fL (ref 80.0–100.0)
Platelets: 147 10*3/uL — ABNORMAL LOW (ref 150–400)
Platelets: 158 10*3/uL (ref 150–400)
RBC: 3.61 MIL/uL — ABNORMAL LOW (ref 4.22–5.81)
RBC: 3.8 MIL/uL — ABNORMAL LOW (ref 4.22–5.81)
RDW: 13.5 % (ref 11.5–15.5)
RDW: 13.5 % (ref 11.5–15.5)
WBC: 11.7 10*3/uL — ABNORMAL HIGH (ref 4.0–10.5)
WBC: 10.8 10*3/uL — ABNORMAL HIGH (ref 4.0–10.5)
nRBC: 0 % (ref 0.0–0.2)
nRBC: 0 % (ref 0.0–0.2)

## 2020-07-19 LAB — COMPREHENSIVE METABOLIC PANEL
ALT: 20 U/L (ref 0–44)
AST: 33 U/L (ref 15–41)
Albumin: 3.4 g/dL — ABNORMAL LOW (ref 3.5–5.0)
Alkaline Phosphatase: 34 U/L — ABNORMAL LOW (ref 38–126)
Anion gap: 4 — ABNORMAL LOW (ref 5–15)
BUN: 17 mg/dL (ref 8–23)
CO2: 24 mmol/L (ref 22–32)
Calcium: 7.7 mg/dL — ABNORMAL LOW (ref 8.9–10.3)
Chloride: 106 mmol/L (ref 98–111)
Creatinine, Ser: 1.1 mg/dL (ref 0.61–1.24)
GFR, Estimated: >60 mL/min (ref >60)
Glucose, Bld: 144 mg/dL — ABNORMAL HIGH (ref 70–99)
Potassium: 4.7 mmol/L (ref 3.5–5.1)
Sodium: 134 mmol/L — ABNORMAL LOW (ref 135–145)
Total Bilirubin: 1.7 mg/dL — ABNORMAL HIGH (ref 0.3–1.2)
Total Protein: 5.3 g/dL — ABNORMAL LOW (ref 6.5–8.1)

## 2020-07-19 LAB — HEMOGLOBIN AND HEMATOCRIT, BLOOD
HCT: 26.7 % — ABNORMAL LOW (ref 39.0–52.0)
Hemoglobin: 9.2 g/dL — ABNORMAL LOW (ref 13.0–17.0)

## 2020-07-19 LAB — ECHO INTRAOPERATIVE TEE
Height: 70 in
Weight: 3068.8 oz

## 2020-07-19 LAB — PLATELET COUNT: Platelets: 144 10*3/uL — ABNORMAL LOW (ref 150–400)

## 2020-07-19 LAB — GLUCOSE, CAPILLARY
Glucose-Capillary: 137 mg/dL — ABNORMAL HIGH (ref 70–99)
Glucose-Capillary: 142 mg/dL — ABNORMAL HIGH (ref 70–99)
Glucose-Capillary: 137 mg/dL — ABNORMAL HIGH (ref 70–99)
Glucose-Capillary: 134 mg/dL — ABNORMAL HIGH (ref 70–99)
Glucose-Capillary: 129 mg/dL — ABNORMAL HIGH (ref 70–99)
Glucose-Capillary: 138 mg/dL — ABNORMAL HIGH (ref 70–99)
Glucose-Capillary: 136 mg/dL — ABNORMAL HIGH (ref 70–99)
Glucose-Capillary: 138 mg/dL — ABNORMAL HIGH (ref 70–99)

## 2020-07-19 LAB — PROTIME-INR
INR: 1.6 — ABNORMAL HIGH (ref 0.8–1.2)
Prothrombin Time: 18.1 seconds — ABNORMAL HIGH (ref 11.4–15.2)

## 2020-07-19 LAB — PHOSPHORUS: Phosphorus: 2.9 mg/dL (ref 2.5–4.6)

## 2020-07-19 LAB — APTT: aPTT: 34 seconds (ref 24–36)

## 2020-07-19 LAB — ABO/RH: ABO/RH(D): O POS

## 2020-07-19 SURGERY — CORONARY ARTERY BYPASS GRAFTING (CABG)
Anesthesia: General | Site: Chest

## 2020-07-19 MED ORDER — DEXMEDETOMIDINE HCL IN NACL 400 MCG/100ML IV SOLN
INTRAVENOUS | Status: DC | PRN
  Administered 2020-07-19: .7 ug/kg/h via INTRAVENOUS

## 2020-07-19 MED ORDER — MIDAZOLAM HCL 2 MG/2ML IJ SOLN: 2.0000 mg | INTRAMUSCULAR | Status: DC | PRN

## 2020-07-19 MED ORDER — SODIUM CHLORIDE 0.9 % IV SOLN: INTRAVENOUS | Status: DC

## 2020-07-19 MED ORDER — FENTANYL CITRATE (PF) 250 MCG/5ML IJ SOLN
INTRAMUSCULAR | Status: DC | PRN
  Administered 2020-07-19: 100 ug via INTRAVENOUS
  Administered 2020-07-19: 50 ug via INTRAVENOUS
  Administered 2020-07-19: 100 ug via INTRAVENOUS
  Administered 2020-07-19 (×4): 50 ug via INTRAVENOUS
  Administered 2020-07-19: 150 ug via INTRAVENOUS
  Administered 2020-07-19: 50 ug via INTRAVENOUS
  Administered 2020-07-19: 100 ug via INTRAVENOUS

## 2020-07-19 MED ORDER — LACTATED RINGERS IV SOLN: INTRAVENOUS | Status: DC | PRN

## 2020-07-19 MED ORDER — MAGNESIUM SULFATE 4 GM/100ML IV SOLN
4.0000 g | Freq: Once | INTRAVENOUS | Status: AC
  Administered 2020-07-19: 4 g via INTRAVENOUS
  Filled 2020-07-19: qty 100

## 2020-07-19 MED ORDER — LACTATED RINGERS IV SOLN: INTRAVENOUS | Status: DC

## 2020-07-19 MED ORDER — THROMBIN 20000 UNITS EX SOLR
CUTANEOUS | Status: DC | PRN
  Administered 2020-07-19: 20000 [IU] via TOPICAL

## 2020-07-19 MED ORDER — ASPIRIN 81 MG PO CHEW: 324.0000 mg | CHEWABLE_TABLET | Freq: Every day | ORAL | Status: DC

## 2020-07-19 MED ORDER — PANTOPRAZOLE SODIUM 40 MG PO TBEC: 40.0000 mg | DELAYED_RELEASE_TABLET | Freq: Every day | ORAL | Status: DC

## 2020-07-19 MED ORDER — 0.9 % SODIUM CHLORIDE (POUR BTL) OPTIME
TOPICAL | Status: DC | PRN
Start: 2020-07-19 — End: 2020-07-19
  Administered 2020-07-19: 5000 mL

## 2020-07-19 MED ORDER — VANCOMYCIN HCL 1000 MG IV SOLR
INTRAVENOUS | Status: DC | PRN
  Administered 2020-07-19: 1500 mg via INTRAVENOUS

## 2020-07-19 MED ORDER — PHENYLEPHRINE HCL-NACL 20-0.9 MG/250ML-% IV SOLN
INTRAVENOUS | Status: DC | PRN
  Administered 2020-07-19: 25 ug/min via INTRAVENOUS

## 2020-07-19 MED ORDER — LACTATED RINGERS IV SOLN: 500.0000 mL | Freq: Once | INTRAVENOUS | Status: DC | PRN

## 2020-07-19 MED ORDER — ONDANSETRON HCL 4 MG/2ML IJ SOLN
4.0000 mg | Freq: Four times a day (QID) | INTRAMUSCULAR | Status: DC | PRN
  Administered 2020-07-19 – 2020-07-21 (×2): 4 mg via INTRAVENOUS
  Filled 2020-07-19 (×2): qty 2

## 2020-07-19 MED ORDER — METOPROLOL TARTRATE 25 MG/10 ML ORAL SUSPENSION: 12.5000 mg | Freq: Two times a day (BID) | ORAL | Status: DC

## 2020-07-19 MED ORDER — PROTAMINE SULFATE 10 MG/ML IV SOLN
INTRAVENOUS | Status: DC | PRN
  Administered 2020-07-19: 270 mg via INTRAVENOUS
  Administered 2020-07-19: 30 mg via INTRAVENOUS

## 2020-07-19 MED ORDER — CHLORHEXIDINE GLUCONATE 4 % EX LIQD: 30.0000 mL | CUTANEOUS | Status: DC

## 2020-07-19 MED ORDER — ASPIRIN EC 325 MG PO TBEC
325.0000 mg | DELAYED_RELEASE_TABLET | Freq: Every day | ORAL | Status: DC
  Administered 2020-07-20 – 2020-07-24 (×5): 325 mg via ORAL
  Filled 2020-07-19 (×5): qty 1

## 2020-07-19 MED ORDER — DEXTROSE 50 % IV SOLN: 0.0000 mL | INTRAVENOUS | Status: DC | PRN

## 2020-07-19 MED ORDER — DEXMEDETOMIDINE HCL IN NACL 400 MCG/100ML IV SOLN
0.0000 ug/kg/h | INTRAVENOUS | Status: DC
  Administered 2020-07-20: 0.2 ug/kg/h via INTRAVENOUS
  Filled 2020-07-19: qty 100

## 2020-07-19 MED ORDER — SODIUM CHLORIDE 0.45 % IV SOLN: INTRAVENOUS | Status: DC | PRN

## 2020-07-19 MED ORDER — ARTIFICIAL TEARS OPHTHALMIC OINT
TOPICAL_OINTMENT | OPHTHALMIC | Status: AC
  Filled 2020-07-19: qty 3.5

## 2020-07-19 MED ORDER — ACETAMINOPHEN 160 MG/5ML PO SOLN: 1000.0000 mg | Freq: Four times a day (QID) | ORAL | Status: DC

## 2020-07-19 MED ORDER — PROTAMINE SULFATE 10 MG/ML IV SOLN
INTRAVENOUS | Status: AC
  Filled 2020-07-19: qty 25

## 2020-07-19 MED ORDER — METOPROLOL TARTRATE 5 MG/5ML IV SOLN
2.5000 mg | INTRAVENOUS | Status: DC | PRN
  Administered 2020-07-20 – 2020-07-22 (×4): 5 mg via INTRAVENOUS
  Filled 2020-07-19 (×4): qty 5

## 2020-07-19 MED ORDER — ROCURONIUM BROMIDE 10 MG/ML (PF) SYRINGE
PREFILLED_SYRINGE | INTRAVENOUS | Status: AC
  Filled 2020-07-19: qty 20

## 2020-07-19 MED ORDER — ROCURONIUM BROMIDE 10 MG/ML (PF) SYRINGE
PREFILLED_SYRINGE | INTRAVENOUS | Status: AC
  Filled 2020-07-19: qty 10

## 2020-07-19 MED ORDER — PHENYLEPHRINE 40 MCG/ML (10ML) SYRINGE FOR IV PUSH (FOR BLOOD PRESSURE SUPPORT)
PREFILLED_SYRINGE | INTRAVENOUS | Status: AC
  Filled 2020-07-19: qty 10

## 2020-07-19 MED ORDER — SODIUM CHLORIDE 0.9% FLUSH: 3.0000 mL | INTRAVENOUS | Status: DC | PRN

## 2020-07-19 MED ORDER — MIDAZOLAM HCL 5 MG/5ML IJ SOLN
INTRAMUSCULAR | Status: DC | PRN
  Administered 2020-07-19: 1 mg via INTRAVENOUS
  Administered 2020-07-19: 2 mg via INTRAVENOUS
  Administered 2020-07-19 (×2): 1 mg via INTRAVENOUS
  Administered 2020-07-19: 2 mg via INTRAVENOUS

## 2020-07-19 MED ORDER — PHENYLEPHRINE HCL-NACL 20-0.9 MG/250ML-% IV SOLN: 0.0000 ug/min | INTRAVENOUS | Status: DC

## 2020-07-19 MED ORDER — BISACODYL 10 MG RE SUPP: 10.0000 mg | Freq: Every day | RECTAL | Status: DC

## 2020-07-19 MED ORDER — HEPARIN SODIUM (PORCINE) 1000 UNIT/ML IJ SOLN
INTRAMUSCULAR | Status: DC | PRN
  Administered 2020-07-19: 30000 [IU] via INTRAVENOUS

## 2020-07-19 MED ORDER — METOPROLOL TARTRATE 12.5 MG HALF TABLET
12.5000 mg | ORAL_TABLET | Freq: Two times a day (BID) | ORAL | Status: DC
  Administered 2020-07-20 – 2020-07-21 (×3): 12.5 mg via ORAL
  Filled 2020-07-19 (×3): qty 1

## 2020-07-19 MED ORDER — ORAL CARE MOUTH RINSE
15.0000 mL | OROMUCOSAL | Status: DC
  Administered 2020-07-19 – 2020-07-21 (×13): 15 mL via OROMUCOSAL

## 2020-07-19 MED ORDER — PROPOFOL 10 MG/ML IV BOLUS
INTRAVENOUS | Status: AC
  Filled 2020-07-19: qty 20

## 2020-07-19 MED ORDER — HEPARIN SODIUM (PORCINE) 1000 UNIT/ML IJ SOLN
INTRAMUSCULAR | Status: AC
  Filled 2020-07-19: qty 1

## 2020-07-19 MED ORDER — SODIUM CHLORIDE 0.9% FLUSH
10.0000 mL | INTRAVENOUS | Status: DC | PRN
  Administered 2020-07-20: 10 mL

## 2020-07-19 MED ORDER — SODIUM CHLORIDE 0.9 % IV SOLN
1.5000 g | Freq: Two times a day (BID) | INTRAVENOUS | Status: AC
  Administered 2020-07-19 – 2020-07-21 (×4): 1.5 g via INTRAVENOUS
  Filled 2020-07-19 (×3): qty 1.5

## 2020-07-19 MED ORDER — ARTIFICIAL TEARS OPHTHALMIC OINT
TOPICAL_OINTMENT | OPHTHALMIC | Status: DC | PRN
  Administered 2020-07-19: 1 via OPHTHALMIC

## 2020-07-19 MED ORDER — ALBUMIN HUMAN 5 % IV SOLN
250.0000 mL | INTRAVENOUS | Status: AC | PRN
  Administered 2020-07-19: 12.5 g via INTRAVENOUS

## 2020-07-19 MED ORDER — SODIUM CHLORIDE 0.9% FLUSH
3.0000 mL | Freq: Two times a day (BID) | INTRAVENOUS | Status: DC
  Administered 2020-07-20 – 2020-07-22 (×5): 3 mL via INTRAVENOUS

## 2020-07-19 MED ORDER — ACETAMINOPHEN 160 MG/5ML PO SOLN: 650.0000 mg | Freq: Once | ORAL | Status: AC

## 2020-07-19 MED ORDER — PROTAMINE SULFATE 10 MG/ML IV SOLN
INTRAVENOUS | Status: AC
  Filled 2020-07-19: qty 5

## 2020-07-19 MED ORDER — DOCUSATE SODIUM 100 MG PO CAPS
200.0000 mg | ORAL_CAPSULE | Freq: Every day | ORAL | Status: DC
  Administered 2020-07-20 – 2020-07-22 (×3): 200 mg via ORAL
  Filled 2020-07-19 (×3): qty 2

## 2020-07-19 MED ORDER — THROMBIN 20000 UNITS EX SOLR
OROMUCOSAL | Status: DC | PRN
  Administered 2020-07-19 (×3): 4 mL via TOPICAL

## 2020-07-19 MED ORDER — FAMOTIDINE IN NACL 20-0.9 MG/50ML-% IV SOLN
20.0000 mg | Freq: Two times a day (BID) | INTRAVENOUS | Status: AC
  Administered 2020-07-19: 20 mg via INTRAVENOUS
  Filled 2020-07-19: qty 50

## 2020-07-19 MED ORDER — TRAMADOL HCL 50 MG PO TABS
50.0000 mg | ORAL_TABLET | ORAL | Status: DC | PRN
Start: 2020-07-19 — End: 2020-07-22
  Administered 2020-07-20: 100 mg via ORAL
  Filled 2020-07-19: qty 2

## 2020-07-19 MED ORDER — SODIUM CHLORIDE 0.9 % IV SOLN: 250.0000 mL | INTRAVENOUS | Status: DC

## 2020-07-19 MED ORDER — VANCOMYCIN HCL IN DEXTROSE 1-5 GM/200ML-% IV SOLN
1000.0000 mg | Freq: Once | INTRAVENOUS | Status: AC
  Administered 2020-07-19: 1000 mg via INTRAVENOUS
  Filled 2020-07-19: qty 200

## 2020-07-19 MED ORDER — NITROGLYCERIN IN D5W 200-5 MCG/ML-% IV SOLN: 0.0000 ug/min | INTRAVENOUS | Status: DC

## 2020-07-19 MED ORDER — POTASSIUM CHLORIDE 10 MEQ/50ML IV SOLN: 10.0000 meq | INTRAVENOUS | Status: AC

## 2020-07-19 MED ORDER — ACETAMINOPHEN 500 MG PO TABS
1000.0000 mg | ORAL_TABLET | Freq: Four times a day (QID) | ORAL | Status: DC
  Administered 2020-07-19 – 2020-07-24 (×17): 1000 mg via ORAL
  Filled 2020-07-19 (×16): qty 2

## 2020-07-19 MED ORDER — THROMBIN (RECOMBINANT) 20000 UNITS EX SOLR
CUTANEOUS | Status: AC
  Filled 2020-07-19: qty 20000

## 2020-07-19 MED ORDER — CHLORHEXIDINE GLUCONATE 0.12 % MT SOLN
15.0000 mL | OROMUCOSAL | Status: AC
  Administered 2020-07-19: 15 mL via OROMUCOSAL

## 2020-07-19 MED ORDER — CHLORHEXIDINE GLUCONATE CLOTH 2 % EX PADS
6.0000 | MEDICATED_PAD | Freq: Every day | CUTANEOUS | Status: DC
  Administered 2020-07-20 – 2020-07-21 (×2): 6 via TOPICAL

## 2020-07-19 MED ORDER — CHLORHEXIDINE GLUCONATE CLOTH 2 % EX PADS
6.0000 | MEDICATED_PAD | Freq: Every day | CUTANEOUS | Status: DC
  Administered 2020-07-19 – 2020-07-22 (×3): 6 via TOPICAL

## 2020-07-19 MED ORDER — HEMOSTATIC AGENTS (NO CHARGE) OPTIME
TOPICAL | Status: DC | PRN
  Administered 2020-07-19: 1 via TOPICAL

## 2020-07-19 MED ORDER — FENTANYL CITRATE (PF) 250 MCG/5ML IJ SOLN
INTRAMUSCULAR | Status: AC
  Filled 2020-07-19: qty 25

## 2020-07-19 MED ORDER — SODIUM CHLORIDE (PF) 0.9 % IJ SOLN
INTRAMUSCULAR | Status: AC
  Filled 2020-07-19: qty 30

## 2020-07-19 MED ORDER — INSULIN REGULAR(HUMAN) IN NACL 100-0.9 UT/100ML-% IV SOLN: INTRAVENOUS | Status: DC

## 2020-07-19 MED ORDER — METOPROLOL TARTRATE 12.5 MG HALF TABLET: 12.5000 mg | ORAL_TABLET | Freq: Once | ORAL | Status: DC

## 2020-07-19 MED ORDER — PHENYLEPHRINE 40 MCG/ML (10ML) SYRINGE FOR IV PUSH (FOR BLOOD PRESSURE SUPPORT)
PREFILLED_SYRINGE | INTRAVENOUS | Status: DC | PRN
  Administered 2020-07-19: 120 ug via INTRAVENOUS
  Administered 2020-07-19 (×2): 80 ug via INTRAVENOUS
  Administered 2020-07-19: 40 ug via INTRAVENOUS
  Administered 2020-07-19: 120 ug via INTRAVENOUS
  Administered 2020-07-19: 40 ug via INTRAVENOUS

## 2020-07-19 MED ORDER — MORPHINE SULFATE (PF) 2 MG/ML IV SOLN
1.0000 mg | INTRAVENOUS | Status: DC | PRN
  Administered 2020-07-19 – 2020-07-21 (×3): 2 mg via INTRAVENOUS
  Filled 2020-07-19: qty 2
  Filled 2020-07-19 (×2): qty 1

## 2020-07-19 MED ORDER — ROCURONIUM BROMIDE 10 MG/ML (PF) SYRINGE
PREFILLED_SYRINGE | INTRAVENOUS | Status: DC | PRN
  Administered 2020-07-19 (×3): 50 mg via INTRAVENOUS
  Administered 2020-07-19: 100 mg via INTRAVENOUS

## 2020-07-19 MED ORDER — CHLORHEXIDINE GLUCONATE 0.12 % MT SOLN
15.0000 mL | Freq: Once | OROMUCOSAL | Status: AC
  Administered 2020-07-19 (×2): 15 mL via OROMUCOSAL

## 2020-07-19 MED ORDER — BISACODYL 5 MG PO TBEC
10.0000 mg | DELAYED_RELEASE_TABLET | Freq: Every day | ORAL | Status: DC
  Administered 2020-07-20 – 2020-07-22 (×3): 10 mg via ORAL
  Filled 2020-07-19 (×3): qty 2

## 2020-07-19 MED ORDER — PROPOFOL 10 MG/ML IV BOLUS
INTRAVENOUS | Status: DC | PRN
  Administered 2020-07-19: 20 mg via INTRAVENOUS
  Administered 2020-07-19: 50 mg via INTRAVENOUS
  Administered 2020-07-19: 20 mg via INTRAVENOUS

## 2020-07-19 MED ORDER — ACETAMINOPHEN 650 MG RE SUPP
650.0000 mg | Freq: Once | RECTAL | Status: AC
  Administered 2020-07-19: 650 mg via RECTAL

## 2020-07-19 MED ORDER — TRANEXAMIC ACID 1000 MG/10ML IV SOLN
INTRAVENOUS | Status: DC | PRN
  Administered 2020-07-19: 1.5 mg/kg/h via INTRAVENOUS

## 2020-07-19 MED ORDER — MIDAZOLAM HCL (PF) 10 MG/2ML IJ SOLN
INTRAMUSCULAR | Status: AC
  Filled 2020-07-19: qty 2

## 2020-07-19 MED ORDER — OXYCODONE HCL 5 MG PO TABS
5.0000 mg | ORAL_TABLET | ORAL | Status: DC | PRN
  Administered 2020-07-20 (×5): 5 mg via ORAL
  Administered 2020-07-21: 10 mg via ORAL
  Administered 2020-07-21: 5 mg via ORAL
  Filled 2020-07-19: qty 2
  Filled 2020-07-19 (×2): qty 1
  Filled 2020-07-19: qty 2
  Filled 2020-07-19: qty 1

## 2020-07-19 MED ORDER — INSULIN REGULAR(HUMAN) IN NACL 100-0.9 UT/100ML-% IV SOLN
INTRAVENOUS | Status: DC | PRN
  Administered 2020-07-19: 1.6 [IU]/h via INTRAVENOUS

## 2020-07-19 MED ORDER — MICROFIBRILLAR COLL HEMOSTAT EX PADS: MEDICATED_PAD | CUTANEOUS | Status: DC | PRN

## 2020-07-19 MED ORDER — SODIUM CHLORIDE 0.9 % IV SOLN
INTRAVENOUS | Status: DC | PRN
  Administered 2020-07-19: 1.5 g via INTRAVENOUS
  Administered 2020-07-19: .75 g via INTRAVENOUS

## 2020-07-19 SURGICAL SUPPLY — 113 items
ADH SKN CLS APL DERMABOND .7 (GAUZE/BANDAGES/DRESSINGS) ×3
BAG DECANTER FOR FLEXI CONT (MISCELLANEOUS) ×4 IMPLANT
BLADE CLIPPER SURG (BLADE) ×4 IMPLANT
BLADE STERNUM SYSTEM 6 (BLADE) ×4 IMPLANT
BLADE SURG 15 STRL LF DISP TIS (BLADE) ×3 IMPLANT
BLADE SURG 15 STRL SS (BLADE) ×4
BNDG CMPR MED 10X6 ELC LF (GAUZE/BANDAGES/DRESSINGS) ×3
BNDG ELASTIC 4X5.8 VLCR STR LF (GAUZE/BANDAGES/DRESSINGS) ×4 IMPLANT
BNDG ELASTIC 6X10 VLCR STRL LF (GAUZE/BANDAGES/DRESSINGS) ×2 IMPLANT
BNDG ELASTIC 6X5.8 VLCR STR LF (GAUZE/BANDAGES/DRESSINGS) ×4 IMPLANT
BNDG GAUZE ELAST 4 BULKY (GAUZE/BANDAGES/DRESSINGS) ×4 IMPLANT
CANISTER SUCT 3000ML PPV (MISCELLANEOUS) ×4 IMPLANT
CATH ROBINSON RED A/P 18FR (CATHETERS) ×8 IMPLANT
CATH THORACIC 28FR (CATHETERS) ×4 IMPLANT
CATH THORACIC 36FR (CATHETERS) ×4 IMPLANT
CATH THORACIC 36FR RT ANG (CATHETERS) ×4 IMPLANT
CLIP VESOCCLUDE MED 24/CT (CLIP) IMPLANT
CLIP VESOCCLUDE SM WIDE 24/CT (CLIP) IMPLANT
COVER MAYO STAND STRL (DRAPES) ×4 IMPLANT
CUFF TOURN SGL QUICK 18X4 (TOURNIQUET CUFF) IMPLANT
CUFF TOURN SGL QUICK 24 (TOURNIQUET CUFF)
CUFF TRNQT CYL 24X4X16.5-23 (TOURNIQUET CUFF) IMPLANT
DERMABOND ADVANCED (GAUZE/BANDAGES/DRESSINGS) ×1
DERMABOND ADVANCED .7 DNX12 (GAUZE/BANDAGES/DRESSINGS) ×1 IMPLANT
DRAPE CARDIOVASCULAR INCISE (DRAPES) ×4
DRAPE EXTREMITY T 121X128X90 (DISPOSABLE) ×4 IMPLANT
DRAPE HALF SHEET 40X57 (DRAPES) ×4 IMPLANT
DRAPE SLUSH/WARMER DISC (DRAPES) ×4 IMPLANT
DRAPE SRG 135X102X78XABS (DRAPES) ×3 IMPLANT
DRSG COVADERM 4X14 (GAUZE/BANDAGES/DRESSINGS) ×4 IMPLANT
DRSG COVADERM 4X8 (GAUZE/BANDAGES/DRESSINGS) ×2 IMPLANT
ELECT CAUTERY BLADE 6.4 (BLADE) ×4 IMPLANT
ELECT REM PT RETURN 9FT ADLT (ELECTROSURGICAL) ×8
ELECTRODE REM PT RTRN 9FT ADLT (ELECTROSURGICAL) ×6 IMPLANT
FELT TEFLON 1X6 (MISCELLANEOUS) ×8 IMPLANT
GAUZE SPONGE 4X4 12PLY STRL (GAUZE/BANDAGES/DRESSINGS) ×8 IMPLANT
GEL ULTRASOUND 20GR AQUASONIC (MISCELLANEOUS) ×4 IMPLANT
GLOVE BIO SURGEON STRL SZ 6 (GLOVE) IMPLANT
GLOVE BIO SURGEON STRL SZ 6.5 (GLOVE) IMPLANT
GLOVE BIO SURGEON STRL SZ7 (GLOVE) IMPLANT
GLOVE BIO SURGEON STRL SZ7.5 (GLOVE) IMPLANT
GLOVE BIOGEL PI IND STRL 6 (GLOVE) IMPLANT
GLOVE BIOGEL PI INDICATOR 6 (GLOVE)
GLOVE ORTHO TXT STRL SZ7.5 (GLOVE) IMPLANT
GLOVE SURG UNDER POLY LF SZ6.5 (GLOVE) IMPLANT
GLOVE SURG UNDER POLY LF SZ7 (GLOVE) IMPLANT
GLOVE TRIUMPH SURG SIZE 7.0 (KITS) ×8 IMPLANT
GOWN STRL REUS W/ TWL LRG LVL3 (GOWN DISPOSABLE) ×12 IMPLANT
GOWN STRL REUS W/ TWL XL LVL3 (GOWN DISPOSABLE) ×3 IMPLANT
GOWN STRL REUS W/TWL LRG LVL3 (GOWN DISPOSABLE) ×16
GOWN STRL REUS W/TWL XL LVL3 (GOWN DISPOSABLE) ×4
HEMOSTAT POWDER SURGIFOAM 1G (HEMOSTASIS) ×12 IMPLANT
HEMOSTAT SURGICEL 2X14 (HEMOSTASIS) ×4 IMPLANT
INSERT FOGARTY 61MM (MISCELLANEOUS) IMPLANT
INSERT FOGARTY XLG (MISCELLANEOUS) IMPLANT
KIT BASIN OR (CUSTOM PROCEDURE TRAY) ×4 IMPLANT
KIT CATH CPB BARTLE (MISCELLANEOUS) ×4 IMPLANT
KIT SUCTION CATH 14FR (SUCTIONS) ×4 IMPLANT
KIT TURNOVER KIT B (KITS) ×4 IMPLANT
KIT VASOVIEW HEMOPRO 2 VH 4000 (KITS) ×4 IMPLANT
NS IRRIG 1000ML POUR BTL (IV SOLUTION) ×20 IMPLANT
PACK E OPEN HEART (SUTURE) ×4 IMPLANT
PACK OPEN HEART (CUSTOM PROCEDURE TRAY) ×4 IMPLANT
PAD ARMBOARD 7.5X6 YLW CONV (MISCELLANEOUS) ×8 IMPLANT
PAD ELECT DEFIB RADIOL ZOLL (MISCELLANEOUS) ×4 IMPLANT
PENCIL BUTTON HOLSTER BLD 10FT (ELECTRODE) ×4 IMPLANT
POSITIONER HEAD DONUT 9IN (MISCELLANEOUS) ×4 IMPLANT
PUMP SARN DELFIN (MISCELLANEOUS) ×2 IMPLANT
PUNCH AORTIC ROTATE 4.0MM (MISCELLANEOUS) IMPLANT
PUNCH AORTIC ROTATE 4.5MM 8IN (MISCELLANEOUS) ×4 IMPLANT
PUNCH AORTIC ROTATE 5MM 8IN (MISCELLANEOUS) IMPLANT
SEALANT SURG COSEAL 8ML (VASCULAR PRODUCTS) ×2 IMPLANT
SET CARDIOPLEGIA MPS 5001102 (MISCELLANEOUS) ×2 IMPLANT
SHEARS HARMONIC 9CM CVD (BLADE) ×4 IMPLANT
SPONGE INTESTINAL PEANUT (DISPOSABLE) IMPLANT
SPONGE LAP 18X18 RF (DISPOSABLE) IMPLANT
SPONGE LAP 4X18 RFD (DISPOSABLE) ×4 IMPLANT
SUPPORT HEART JANKE-BARRON (MISCELLANEOUS) ×4 IMPLANT
SUT BONE WAX W31G (SUTURE) ×4 IMPLANT
SUT MNCRL AB 4-0 PS2 18 (SUTURE) IMPLANT
SUT PROLENE 3 0 SH DA (SUTURE) IMPLANT
SUT PROLENE 3 0 SH1 36 (SUTURE) ×4 IMPLANT
SUT PROLENE 4 0 RB 1 (SUTURE)
SUT PROLENE 4 0 SH DA (SUTURE) IMPLANT
SUT PROLENE 4-0 RB1 .5 CRCL 36 (SUTURE) IMPLANT
SUT PROLENE 5 0 C 1 36 (SUTURE) ×4 IMPLANT
SUT PROLENE 6 0 C 1 30 (SUTURE) ×4 IMPLANT
SUT PROLENE 7 0 BV 1 (SUTURE) IMPLANT
SUT PROLENE 7 0 BV1 MDA (SUTURE) ×4 IMPLANT
SUT PROLENE 8 0 BV175 6 (SUTURE) ×2 IMPLANT
SUT SILK  1 MH (SUTURE)
SUT SILK 1 MH (SUTURE) IMPLANT
SUT SILK 2 0 SH (SUTURE) IMPLANT
SUT STEEL STERNAL CCS#1 18IN (SUTURE) ×2 IMPLANT
SUT STEEL SZ 6 DBL 3X14 BALL (SUTURE) ×4 IMPLANT
SUT TEM PAC WIRE 2 0 SH (SUTURE) ×2 IMPLANT
SUT VIC AB 1 CTX 36 (SUTURE) ×12
SUT VIC AB 1 CTX36XBRD ANBCTR (SUTURE) ×7 IMPLANT
SUT VIC AB 2-0 CT1 27 (SUTURE) ×4
SUT VIC AB 2-0 CT1 TAPERPNT 27 (SUTURE) ×1 IMPLANT
SUT VIC AB 2-0 CTX 27 (SUTURE) IMPLANT
SUT VIC AB 3-0 SH 27 (SUTURE)
SUT VIC AB 3-0 SH 27X BRD (SUTURE) IMPLANT
SUT VIC AB 3-0 X1 27 (SUTURE) ×6 IMPLANT
SUT VICRYL 4-0 PS2 18IN ABS (SUTURE) IMPLANT
SYSTEM SAHARA CHEST DRAIN ATS (WOUND CARE) ×4 IMPLANT
TAPE PAPER 3X10 WHT MICROPORE (GAUZE/BANDAGES/DRESSINGS) ×2 IMPLANT
TOWEL GREEN STERILE (TOWEL DISPOSABLE) ×4 IMPLANT
TOWEL GREEN STERILE FF (TOWEL DISPOSABLE) ×4 IMPLANT
TRAY FOLEY SLVR 16FR TEMP STAT (SET/KITS/TRAYS/PACK) ×4 IMPLANT
TUBING LAP HI FLOW INSUFFLATIO (TUBING) ×4 IMPLANT
UNDERPAD 30X36 HEAVY ABSORB (UNDERPADS AND DIAPERS) ×4 IMPLANT
WATER STERILE IRR 1000ML POUR (IV SOLUTION) ×8 IMPLANT

## 2020-07-19 NOTE — Progress Notes (Signed)
NIF-26, VC 875 L

## 2020-07-19 NOTE — Progress Notes (Signed)
  Echocardiogram Echocardiogram Transesophageal has been performed.  Daniel Carey 07/19/2020, 8:26 AM

## 2020-07-19 NOTE — Anesthesia Procedure Notes (Signed)
Arterial Line Insertion Start/End4/04/2020 7:00 AM, 07/19/2020 7:10 AM Performed by: Bryson Corona, CRNA, CRNA  Patient location: Pre-op. Preanesthetic checklist: patient identified, IV checked, site marked, risks and benefits discussed, surgical consent, monitors and equipment checked, pre-op evaluation, timeout performed and anesthesia consent Lidocaine 1% used for infiltration Left, radial was placed Catheter size: 20 Fr Hand hygiene performed  and maximum sterile barriers used   Attempts: 1 Procedure performed without using ultrasound guided technique. Following insertion, dressing applied and Biopatch. Post procedure assessment: normal and unchanged  Patient tolerated the procedure well with no immediate complications.

## 2020-07-19 NOTE — Brief Op Note (Signed)
07/19/2020  11:15 AM  PATIENT:  Daniel Carey  71 y.o. male  PRE-OPERATIVE DIAGNOSIS:  CAD  POST-OPERATIVE DIAGNOSIS:  CAD  PROCEDURES:   CORONARY ARTERY BYPASS GRAFTING (CABG) TIMES THREE USING LEFT INTERNAL MAMMARY ARTERY AND RIGHT GREATER SAPHENOUS VEIN HARVESTED ENDOSCOPICALLY   LIMA->LAD  SVG->OM  SVG->PDA  Vein harvest 75min     Vein prep time: 58min  TRANSESOPHAGEAL ECHOCARDIOGRAM   SURGEON:   Gaye Pollack, MD - Primary  PHYSICIAN ASSISTANT: Vanessa KickBeverlee Nims, RNFA  ANESTHESIA:   general  EBL:  Per anesthesia and perfusion records   BLOOD ADMINISTERED:none  DRAINS: Left pleural and mediastinal tubes   LOCAL MEDICATIONS USED:  NONE  SPECIMEN:  No Specimen  DISPOSITION OF SPECIMEN:  N/A  COUNTS:  YES  DICTATION: .Dragon Dictation  PLAN OF CARE: Admit to inpatient   PATIENT DISPOSITION:  ICU - intubated and hemodynamically stable.   Delay start of Pharmacological VTE agent (>24hrs) due to surgical blood loss or risk of bleeding: yes

## 2020-07-19 NOTE — Procedures (Signed)
Extubation Procedure Note  Patient Details:   Name: Daniel Carey DOB: 1951-01-20 MRN: [id number]   Airway Documentation:    Vent end date: 07/19/20 Vent end time: 1755   Evaluation  O2 sats: stable throughout Complications: No apparent complications Patient did tolerate procedure well. Bilateral Breath Sounds: Clear,Diminished   Yes  Gonzella Lex 07/19/2020, 5:59 PM

## 2020-07-19 NOTE — Anesthesia Procedure Notes (Signed)
Central Venous Catheter Insertion Performed by: Murvin Natal, MD, anesthesiologist Start/End4/04/2020 6:45 AM, 07/19/2020 7:00 AM Patient location: Pre-op. Preanesthetic checklist: patient identified, IV checked, site marked, risks and benefits discussed, surgical consent, monitors and equipment checked, pre-op evaluation, timeout performed and anesthesia consent Position: Trendelenburg Lidocaine 1% used for infiltration and patient sedated Hand hygiene performed , maximum sterile barriers used  and Seldinger technique used Catheter size: 9 Fr Total catheter length 12. PA cath was placed.MAC introducer Swan type:thermodilution PA Cath depth:45 Procedure performed using ultrasound guided technique. Ultrasound Notes:anatomy identified, needle tip was noted to be adjacent to the nerve/plexus identified and no ultrasound evidence of intravascular and/or intraneural injection Attempts: 1 Following insertion, line sutured, dressing applied and Biopatch. Post procedure assessment: blood return through all ports, free fluid flow and no air  Patient tolerated the procedure well with no immediate complications.

## 2020-07-19 NOTE — Progress Notes (Signed)
      [address]       [place] [address]             [phone]    S/p CABg x 3  Extubated, neuro intact  BP 134/87   Pulse 74   Temp 97.88 F (36.6 C)   Resp 17   Ht 5\' 10"  (1.778 m)   Wt 87 kg   SpO2 96%   BMI 27.52 kg/m  4L [place] 96% sat 35/19 On neo at 20  Intake/Output Summary (Last 24 hours) at 07/19/2020 1944 Last data filed at 07/19/2020 1900 Gross per 24 hour  Intake 3769.39 ml  Output 1985 ml  Net 1784.39 ml   200 from CT since OR  Hct= 34 K= 4.6 CBg well ocntrolled  Remo Lipps C. Roxan Hockey, MD [facility] [phone]

## 2020-07-19 NOTE — Anesthesia Procedure Notes (Signed)
Procedure Name: Intubation Date/Time: 07/19/2020 7:57 AM Performed by: Bryson Corona, CRNA Pre-anesthesia Checklist: Patient identified, Emergency Drugs available, Suction available and Patient being monitored Patient Re-evaluated:Patient Re-evaluated prior to induction Oxygen Delivery Method: Circle System Utilized Preoxygenation: Pre-oxygenation with 100% oxygen Induction Type: IV induction Ventilation: Mask ventilation without difficulty Laryngoscope Size: Mac and 4 Grade View: Grade I Tube type: Oral Tube size: 8.0 mm Number of attempts: 1 Airway Equipment and Method: Stylet and Oral airway Placement Confirmation: ETT inserted through vocal cords under direct vision,  positive ETCO2 and breath sounds checked- equal and bilateral Secured at: 23 cm Tube secured with: Tape Dental Injury: Teeth and Oropharynx as per pre-operative assessment

## 2020-07-19 NOTE — Interval H&P Note (Signed)
History and Physical Interval Note:  07/19/2020 6:41 AM  [name]  has presented today for surgery, with the diagnosis of CAD.  The various methods of treatment have been discussed with the patient and family. After consideration of risks, benefits and other options for treatment, the patient has consented to  Procedure(s): CORONARY ARTERY BYPASS GRAFTING (CABG) (N/A) RADIAL ARTERY HARVEST (Left) TRANSESOPHAGEAL ECHOCARDIOGRAM (TEE) (N/A) as a surgical intervention.  The patient's history has been reviewed, patient examined, no change in status, stable for surgery.  I have reviewed the patient's chart and labs.  Questions were answered to the patient's satisfaction.     Daniel Carey

## 2020-07-19 NOTE — Transfer of Care (Signed)
Immediate Anesthesia Transfer of Care Note  Patient: Daniel Carey  Procedure(s) Performed: CORONARY ARTERY BYPASS GRAFTING (CABG) TIMES THREE USING LEFT INTERNAL MAMMARY ARTERY AND RIGHT GREATER SAPHENOUS VEIN HARVESTED ENDOSCOPICALLY (N/A Chest) TRANSESOPHAGEAL ECHOCARDIOGRAM (TEE) (N/A )  Patient Location: ICU  Anesthesia Type:General  Level of Consciousness: Patient remains intubated per anesthesia plan  Airway & Oxygen Therapy: Patient remains intubated per anesthesia plan and Patient placed on Ventilator (see vital sign flow sheet for setting)  Post-op Assessment: Report given to RN and Post -op Vital signs reviewed and stable  Post vital signs: Reviewed and stable  Last Vitals:  Vitals Value Taken Time  BP 98/54 07/19/20 1327  Temp 36.4 C 07/19/20 1330  Pulse 73 07/19/20 1330  Resp 12 07/19/20 1330  SpO2 98 % 07/19/20 1330  Vitals shown include unvalidated device data.  Last Pain:  Vitals:   07/19/20 0558  TempSrc: Oral  PainSc:          Complications: No complications documented.

## 2020-07-19 NOTE — Op Note (Signed)
CARDIOVASCULAR SURGERY OPERATIVE NOTE  07/19/2020  Surgeon:  Gaye Pollack, MD  First Assistant: Enid Cutter,  PA-C   Preoperative Diagnosis:  Severe multi-vessel coronary artery disease   Postoperative Diagnosis:  Same   Procedure:  1. Median Sternotomy 2. Extracorporeal circulation 3.   Coronary artery bypass grafting x 3   Left internal mammary artery graft to the LAD  SVG to OM3  SVG to PDA  4.   Endoscopic vein harvest from the right leg   Anesthesia:  General Endotracheal   Clinical History/Surgical Indication:  This 70 year old gentleman has severe multivessel coronary disease with diffusely diseased and calcified vessels. He had anabnormal FFR in the LAD by catheterization in 2018.He has progressively worsening anginal symptoms that are limiting his lifestyle and ability to remain active. I agree that coronary artery bypass graft surgery is the best treatment.   His preoperative Doppler examination shows that the left upper extremity is dependent on the radial and ulnar arteries and therefore left radial artery bypass cannot be used.  The right radial artery was used for catheterization and therefore I would not use that artery for grafting.I would not use bilateral internal mammary artery grafts in this patient due to his history of diabetes and remote smoking with moderate emphysematous lung changes on previous chest CT in 2013.  His carotid Doppler examination showed a 40 to 59% right ICA stenosis and 60 to 79% left ICA stenosis.  There is greater than 50% left common carotid artery stenosis.  The patient is asymptomatic and does not require treatment for these at this time but will require vascular surgery follow-up.  I discussed the operative procedure with the patient andhis wifeincluding alternatives, benefits and risks; including but not limited to  bleeding, blood transfusion, infection, stroke, myocardial infarction, graft failure, heart block requiring a permanent pacemaker, organ dysfunction, and death. [place] and agrees to proceed.   Preparation:  The patient was seen in the preoperative holding area and the correct patient, correct operation were confirmed with the patient after reviewing the medical record and catheterization. The consent was signed by me. Preoperative antibiotics were given. A pulmonary arterial line and radial arterial line were placed by the anesthesia team. The patient was taken back to the operating room and positioned supine on the operating room table. After being placed under general endotracheal anesthesia by the anesthesia team a foley catheter was placed. The neck, chest, abdomen, and both legs were prepped with betadine soap and solution and draped in the usual sterile manner. A surgical time-out was taken and the correct patient and operative procedure were confirmed with the nursing and anesthesia staff.   Cardiopulmonary Bypass:  A median sternotomy was performed. The pericardium was opened in the midline. Right ventricular function appeared normal. The ascending aorta was of normal size and had no palpable plaque. There were no contraindications to aortic cannulation or cross-clamping. The patient was fully systemically heparinized and the ACT was maintained > 400 sec. The proximal aortic arch was cannulated with a 20 F aortic cannula for arterial inflow. Venous cannulation was performed via the right atrial appendage using a two-staged venous cannula. An antegrade cardioplegia/vent cannula was inserted into the mid-ascending aorta. Aortic occlusion was performed with a single cross-clamp. Systemic cooling to 32 degrees Centigrade and topical cooling of the heart with iced saline were used. Hyperkalemic antegrade cold blood cardioplegia was used to induce diastolic arrest and was then given  at about 20 minute intervals throughout the period  of arrest to maintain myocardial temperature at or below 10 degrees centigrade. A temperature probe was inserted into the interventricular septum and an insulating pad was placed in the pericardium.   Left internal mammary artery harvest:  The left side of the sternum was retracted using the Rultract retractor. The left internal mammary artery was harvested as a pedicle graft. All side branches were clipped. It was a medium-sized vessel of good quality with excellent blood flow. It was ligated distally and divided. It was sprayed with topical papaverine solution to prevent vasospasm.   Endoscopic vein harvest:  The right greater saphenous vein was harvested endoscopically through a 2 cm incision medial to the right knee. It was harvested from the upper thigh to below the knee. It was a medium-sized vein of good quality. The side branches were all ligated with 4-0 silk ties.    Coronary arteries:  The coronary arteries were examined.   LAD:  Diffusely diseased with calcific plaque throughout. I was able to graft the distal vessel.  LCX:  OM 1 and OM2 were very small and diffusely diseased. The larger OM3 was diffusely diseased but I was able to graft it distally.  RCA:  Diffusely diseased with calcific plaque. The PDA was small but graftable.   Grafts:  1. LIMA to the LAD: 1.5 mm distally. It was sewn end to side using 8-0 prolene continuous suture. 2. SVG to OM3:  1.5 mm distally. It was sewn end to side using 7-0 prolene continuous suture. 3. SVG to PDA:  1.5 mm distally. It was sewn end to side using 7-0 prolene continuous suture.  The proximal vein graft anastomoses were performed to the mid-ascending aorta using continuous 6-0 prolene suture. Graft markers were placed around the proximal anastomoses.   Completion:  The patient was rewarmed to 37 degrees Centigrade. The clamp was removed from the LIMA pedicle and there was  rapid warming of the septum and return of ventricular fibrillation. The crossclamp was removed with a time of 87 minutes. There was spontaneous return of AV paced rhythm. The distal and proximal anastomoses were checked for hemostasis. The position of the grafts was satisfactory. Two temporary epicardial pacing wires were placed on the right atrium and two on the right ventricle. The patient was weaned from CPB without difficulty on no inotropes. CPB time was 108 minutes. TEE showed normal LV systolic function.  Heparin was fully reversed with protamine and the aortic and venous cannulas removed. Hemostasis was achieved. Mediastinal and left pleural drainage tubes were placed. The sternum was closed with double #6 stainless steel wires. The fascia was closed with continuous # 1 vicryl suture. The subcutaneous tissue was closed with 2-0 vicryl continuous suture. The skin was closed with 3-0 vicryl subcuticular suture. All sponge, needle, and instrument counts were reported correct at the end of the case. Dry sterile dressings were placed over the incisions and around the chest tubes which were connected to pleurevac suction. The patient was then transported to the surgical intensive care unit in stable condition.

## 2020-07-19 NOTE — Anesthesia Postprocedure Evaluation (Signed)
Anesthesia Post Note  Patient: LEIBISH MCGREGOR  Procedure(s) Performed: CORONARY ARTERY BYPASS GRAFTING (CABG) TIMES THREE USING LEFT INTERNAL MAMMARY ARTERY AND RIGHT GREATER SAPHENOUS VEIN HARVESTED ENDOSCOPICALLY (N/A Chest) TRANSESOPHAGEAL ECHOCARDIOGRAM (TEE) (N/A )     Patient location during evaluation: SICU Anesthesia Type: General Level of consciousness: sedated Pain management: pain level controlled Vital Signs Assessment: post-procedure vital signs reviewed and stable Respiratory status: patient remains intubated per anesthesia plan Cardiovascular status: stable Postop Assessment: no apparent nausea or vomiting Anesthetic complications: no   No complications documented.  Last Vitals:  Vitals:   07/19/20 1430 07/19/20 1445  BP:    Pulse: 73 73  Resp: 16 16  Temp: (!) 36.1 C (!) 36.1 C  SpO2: 99% 99%    Last Pain:  Vitals:   07/19/20 0558  TempSrc: Oral  PainSc:                  Karyl Kinnier Sylvi Rybolt

## 2020-07-20 ENCOUNTER — Inpatient Hospital Stay (HOSPITAL_COMMUNITY): Payer: BC Managed Care – PPO

## 2020-07-20 LAB — BASIC METABOLIC PANEL
Anion gap: 5 (ref 5–15)
Anion gap: 12 (ref 5–15)
BUN: 16 mg/dL (ref 8–23)
BUN: 21 mg/dL (ref 8–23)
CO2: 23 mmol/L (ref 22–32)
CO2: 21 mmol/L — ABNORMAL LOW (ref 22–32)
Calcium: 8 mg/dL — ABNORMAL LOW (ref 8.9–10.3)
Calcium: 8.4 mg/dL — ABNORMAL LOW (ref 8.9–10.3)
Chloride: 105 mmol/L (ref 98–111)
Chloride: 101 mmol/L (ref 98–111)
Creatinine, Ser: 1.13 mg/dL (ref 0.61–1.24)
Creatinine, Ser: 1.44 mg/dL — ABNORMAL HIGH (ref 0.61–1.24)
GFR, Estimated: >60 mL/min (ref >60)
GFR, Estimated: 53 mL/min — ABNORMAL LOW (ref >60)
Glucose, Bld: 136 mg/dL — ABNORMAL HIGH (ref 70–99)
Glucose, Bld: 143 mg/dL — ABNORMAL HIGH (ref 70–99)
Potassium: 4.5 mmol/L (ref 3.5–5.1)
Potassium: 4.2 mmol/L (ref 3.5–5.1)
Sodium: 133 mmol/L — ABNORMAL LOW (ref 135–145)
Sodium: 134 mmol/L — ABNORMAL LOW (ref 135–145)

## 2020-07-20 LAB — CBC
HCT: 35.1 % — ABNORMAL LOW (ref 39.0–52.0)
HCT: 37.9 % — ABNORMAL LOW (ref 39.0–52.0)
Hemoglobin: 11.8 g/dL — ABNORMAL LOW (ref 13.0–17.0)
Hemoglobin: 12.3 g/dL — ABNORMAL LOW (ref 13.0–17.0)
MCH: 31 pg (ref 26.0–34.0)
MCH: 30.2 pg (ref 26.0–34.0)
MCHC: 33.6 g/dL (ref 30.0–36.0)
MCHC: 32.5 g/dL (ref 30.0–36.0)
MCV: 92.1 fL (ref 80.0–100.0)
MCV: 93.1 fL (ref 80.0–100.0)
Platelets: 166 10*3/uL (ref 150–400)
Platelets: 175 10*3/uL (ref 150–400)
RBC: 3.81 MIL/uL — ABNORMAL LOW (ref 4.22–5.81)
RBC: 4.07 MIL/uL — ABNORMAL LOW (ref 4.22–5.81)
RDW: 13.7 % (ref 11.5–15.5)
RDW: 13.8 % (ref 11.5–15.5)
WBC: 11.7 10*3/uL — ABNORMAL HIGH (ref 4.0–10.5)
WBC: 13.5 10*3/uL — ABNORMAL HIGH (ref 4.0–10.5)
nRBC: 0 % (ref 0.0–0.2)
nRBC: 0 % (ref 0.0–0.2)

## 2020-07-20 LAB — GLUCOSE, CAPILLARY
Glucose-Capillary: 114 mg/dL — ABNORMAL HIGH (ref 70–99)
Glucose-Capillary: 118 mg/dL — ABNORMAL HIGH (ref 70–99)
Glucose-Capillary: 126 mg/dL — ABNORMAL HIGH (ref 70–99)
Glucose-Capillary: 129 mg/dL — ABNORMAL HIGH (ref 70–99)
Glucose-Capillary: 132 mg/dL — ABNORMAL HIGH (ref 70–99)
Glucose-Capillary: 137 mg/dL — ABNORMAL HIGH (ref 70–99)
Glucose-Capillary: 147 mg/dL — ABNORMAL HIGH (ref 70–99)
Glucose-Capillary: 151 mg/dL — ABNORMAL HIGH (ref 70–99)
Glucose-Capillary: 188 mg/dL — ABNORMAL HIGH (ref 70–99)
Glucose-Capillary: 98 mg/dL (ref 70–99)

## 2020-07-20 LAB — MAGNESIUM
Magnesium: 2.5 mg/dL — ABNORMAL HIGH (ref 1.7–2.4)
Magnesium: 2.2 mg/dL (ref 1.7–2.4)

## 2020-07-20 MED ORDER — FUROSEMIDE 10 MG/ML IJ SOLN
20.0000 mg | Freq: Two times a day (BID) | INTRAMUSCULAR | Status: AC
  Administered 2020-07-20 (×2): 20 mg via INTRAVENOUS
  Filled 2020-07-20 (×2): qty 2

## 2020-07-20 MED ORDER — PANTOPRAZOLE SODIUM 40 MG PO TBEC: 40.0000 mg | DELAYED_RELEASE_TABLET | Freq: Two times a day (BID) | ORAL | Status: DC

## 2020-07-20 MED ORDER — METOCLOPRAMIDE HCL 5 MG/ML IJ SOLN
10.0000 mg | Freq: Four times a day (QID) | INTRAMUSCULAR | Status: AC
  Administered 2020-07-20 – 2020-07-21 (×4): 10 mg via INTRAVENOUS
  Filled 2020-07-20 (×4): qty 2

## 2020-07-20 MED ORDER — INSULIN DETEMIR 100 UNIT/ML ~~LOC~~ SOLN
15.0000 [IU] | Freq: Two times a day (BID) | SUBCUTANEOUS | Status: DC
  Administered 2020-07-20 – 2020-07-21 (×4): 15 [IU] via SUBCUTANEOUS
  Filled 2020-07-20 (×6): qty 0.15

## 2020-07-20 MED ORDER — ROSUVASTATIN CALCIUM 20 MG PO TABS
20.0000 mg | ORAL_TABLET | Freq: Every day | ORAL | Status: DC
  Administered 2020-07-20: 20 mg via ORAL
  Filled 2020-07-20: qty 1

## 2020-07-20 MED ORDER — PANTOPRAZOLE SODIUM 40 MG PO TBEC
40.0000 mg | DELAYED_RELEASE_TABLET | Freq: Two times a day (BID) | ORAL | Status: DC
  Administered 2020-07-20 – 2020-07-24 (×8): 40 mg via ORAL
  Filled 2020-07-20 (×8): qty 1

## 2020-07-20 MED ORDER — ALUM & MAG HYDROXIDE-SIMETH 200-200-20 MG/5ML PO SUSP
30.0000 mL | Freq: Three times a day (TID) | ORAL | Status: DC | PRN
  Administered 2020-07-20: 30 mL via ORAL
  Filled 2020-07-20: qty 30

## 2020-07-20 MED ORDER — INSULIN ASPART 100 UNIT/ML ~~LOC~~ SOLN
0.0000 [IU] | SUBCUTANEOUS | Status: DC
  Administered 2020-07-20: 2 [IU] via SUBCUTANEOUS
  Administered 2020-07-20 – 2020-07-21 (×2): 4 [IU] via SUBCUTANEOUS
  Administered 2020-07-21 – 2020-07-23 (×4): 2 [IU] via SUBCUTANEOUS

## 2020-07-20 MED ORDER — ENOXAPARIN SODIUM 40 MG/0.4ML ~~LOC~~ SOLN
40.0000 mg | Freq: Every day | SUBCUTANEOUS | Status: DC
  Administered 2020-07-20 – 2020-07-23 (×4): 40 mg via SUBCUTANEOUS
  Filled 2020-07-20 (×4): qty 0.4

## 2020-07-20 NOTE — Progress Notes (Signed)
1 Day Post-Op Procedure(s) (LRB): CORONARY ARTERY BYPASS GRAFTING (CABG) TIMES THREE USING LEFT INTERNAL MAMMARY ARTERY AND RIGHT GREATER SAPHENOUS VEIN HARVESTED ENDOSCOPICALLY (N/A) TRANSESOPHAGEAL ECHOCARDIOGRAM (TEE) (N/A) Subjective: C/o incisional pain  Objective: Vital signs in last 24 hours: Temp:  [96.6 F (35.9 C)-98.06 F (36.7 C)] 97.52 F (36.4 C) (04/02 0845) Pulse Rate:  [59-77] 59 (04/02 0845) Resp:  [11-21] 17 (04/02 0845) BP: (84-149)/(45-93) 98/76 (04/02 0800) SpO2:  [93 %-100 %] 99 % (04/02 0845) Arterial Line BP: (95-160)/(50-80) 120/58 (04/02 0845) FiO2 (%):  [40 %-50 %] 40 % (04/01 1734) Weight:  [91.6 kg] 91.6 kg (04/02 0300)  Hemodynamic parameters for last 24 hours: PAP: (23-51)/(9-28) 41/21 CO:  [2.3 L/min-2.6 L/min] 2.6 L/min CI:  [1.1 L/min/m2-1.2 L/min/m2] 1.2 L/min/m2  Intake/Output from previous day: 04/01 0701 - 04/02 0700 In: 4624.7 [I.V.:3474.8; Blood:300; IV Piggyback:849.9] Out: 2795 [Urine:1910; Blood:485; Chest Tube:400] Intake/Output this shift: Total I/O In: 44.2 [I.V.:44.2] Out: -   General appearance: alert, cooperative and no distress Neurologic: intact Heart: regular rate and rhythm Lungs: diminished breath sounds bibasilar Abdomen: mildly distended, tympanitic, nontender  Lab Results: Recent Labs    07/19/20 1950 07/20/20 0346  WBC 10.8* 11.7*  HGB 11.8* 11.8*  HCT 34.7* 35.1*  PLT 158 166   BMET:  Recent Labs    07/19/20 1950 07/20/20 0346  NA 134* 133*  K 4.7 4.5  CL 106 105  CO2 24 23  GLUCOSE 144* 136*  BUN 17 16  CREATININE 1.10 1.13  CALCIUM 7.7* 8.0*    PT/INR:  Recent Labs    07/19/20 1337  LABPROT 18.1*  INR 1.6*   ABG    Component Value Date/Time   PHART 7.342 (L) 07/19/2020 1755   HCO3 23.9 07/19/2020 1755   TCO2 25 07/19/2020 1755   ACIDBASEDEF 2.0 07/19/2020 1755   O2SAT 96.0 07/19/2020 1755   CBG (last 3)  Recent Labs    07/20/20 0147 07/20/20 0350 07/20/20 0603  GLUCAP  129* 126* 132*    Assessment/Plan: S/P Procedure(s) (LRB): CORONARY ARTERY BYPASS GRAFTING (CABG) TIMES THREE USING LEFT INTERNAL MAMMARY ARTERY AND RIGHT GREATER SAPHENOUS VEIN HARVESTED ENDOSCOPICALLY (N/A) TRANSESOPHAGEAL ECHOCARDIOGRAM (TEE) (N/A) POD # 1  CV- paced, BP OK, Filling pressures Ok Co from [place] not accurate- [employer]  Dc A line RESP- IS for atelectasis RENAL- creatinine and lytes OK- diurese ENDO- CBG well controlled with insulin drip  Transition to levemir ++ SSI Anemia secondary to ABL- mild SCD + enoxaparin for DVT prophylaxis GI advance diet as tolerated Dc chest tubes Ambulate   LOS: 1 day    Daniel Carey 07/20/2020

## 2020-07-20 NOTE — Progress Notes (Signed)
      [address]       [place] [address]             [phone]    POD # 1 CABG x 3  C/o burning pain in chest with cough  BP (!) 155/72   Pulse 74   Temp 98.1 F (36.7 C) (Oral)   Resp 18   Ht 5\' 10"  (1.778 m)   Wt 91.6 kg   SpO2 99%   BMI 28.98 kg/m   Intake/Output Summary (Last 24 hours) at 07/20/2020 1732 Last data filed at 07/20/2020 1300 Gross per 24 hour  Intake 1541.22 ml  Output 940 ml  Net 601.22 ml    Creatinine 1.44 up from 1.13 K= 4.2 Hct= 38  He thinks pain is due to reflux, more likely incisional in nature  Will double protonix and also give PRN Maalox  Daniel Lipps C. Roxan Hockey, MD [facility] [phone]

## 2020-07-21 ENCOUNTER — Inpatient Hospital Stay (HOSPITAL_COMMUNITY): Payer: BC Managed Care – PPO

## 2020-07-21 LAB — CBC
HCT: 35.5 % — ABNORMAL LOW (ref 39.0–52.0)
Hemoglobin: 12.1 g/dL — ABNORMAL LOW (ref 13.0–17.0)
MCH: 31.5 pg (ref 26.0–34.0)
MCHC: 34.1 g/dL (ref 30.0–36.0)
MCV: 92.4 fL (ref 80.0–100.0)
Platelets: 175 10*3/uL (ref 150–400)
RBC: 3.84 MIL/uL — ABNORMAL LOW (ref 4.22–5.81)
RDW: 13.6 % (ref 11.5–15.5)
WBC: 12.9 10*3/uL — ABNORMAL HIGH (ref 4.0–10.5)
nRBC: 0 % (ref 0.0–0.2)

## 2020-07-21 LAB — BASIC METABOLIC PANEL
Anion gap: 9 (ref 5–15)
BUN: 21 mg/dL (ref 8–23)
CO2: 26 mmol/L (ref 22–32)
Calcium: 8.7 mg/dL — ABNORMAL LOW (ref 8.9–10.3)
Chloride: 99 mmol/L (ref 98–111)
Creatinine, Ser: 1.25 mg/dL — ABNORMAL HIGH (ref 0.61–1.24)
GFR, Estimated: >60 mL/min (ref >60)
Glucose, Bld: 151 mg/dL — ABNORMAL HIGH (ref 70–99)
Potassium: 4.3 mmol/L (ref 3.5–5.1)
Sodium: 134 mmol/L — ABNORMAL LOW (ref 135–145)

## 2020-07-21 LAB — GLUCOSE, CAPILLARY
Glucose-Capillary: 109 mg/dL — ABNORMAL HIGH (ref 70–99)
Glucose-Capillary: 120 mg/dL — ABNORMAL HIGH (ref 70–99)
Glucose-Capillary: 127 mg/dL — ABNORMAL HIGH (ref 70–99)
Glucose-Capillary: 134 mg/dL — ABNORMAL HIGH (ref 70–99)
Glucose-Capillary: 150 mg/dL — ABNORMAL HIGH (ref 70–99)
Glucose-Capillary: 182 mg/dL — ABNORMAL HIGH (ref 70–99)

## 2020-07-21 MED ORDER — ORAL CARE MOUTH RINSE
15.0000 mL | Freq: Two times a day (BID) | OROMUCOSAL | Status: DC
  Administered 2020-07-22 – 2020-07-23 (×3): 15 mL via OROMUCOSAL

## 2020-07-21 MED ORDER — FUROSEMIDE 10 MG/ML IJ SOLN
20.0000 mg | Freq: Two times a day (BID) | INTRAMUSCULAR | Status: AC
  Administered 2020-07-21 (×2): 20 mg via INTRAVENOUS
  Filled 2020-07-21 (×2): qty 2

## 2020-07-21 MED ORDER — ROSUVASTATIN CALCIUM 20 MG PO TABS
20.0000 mg | ORAL_TABLET | Freq: Every day | ORAL | Status: DC
  Administered 2020-07-21 – 2020-07-23 (×3): 20 mg via ORAL
  Filled 2020-07-21 (×3): qty 1

## 2020-07-21 MED ORDER — HYDROCHLOROTHIAZIDE 25 MG PO TABS
12.5000 mg | ORAL_TABLET | Freq: Every day | ORAL | Status: DC
  Administered 2020-07-21 – 2020-07-24 (×5): 12.5 mg via ORAL
  Filled 2020-07-21 (×4): qty 1

## 2020-07-21 MED ORDER — METOPROLOL TARTRATE 25 MG PO TABS
25.0000 mg | ORAL_TABLET | Freq: Two times a day (BID) | ORAL | Status: DC
  Administered 2020-07-21: 12.5 mg via ORAL
  Administered 2020-07-21 – 2020-07-24 (×6): 25 mg via ORAL
  Filled 2020-07-21 (×7): qty 1

## 2020-07-21 MED ORDER — ISOSORBIDE MONONITRATE ER 60 MG PO TB24
60.0000 mg | ORAL_TABLET | Freq: Every day | ORAL | Status: DC
  Administered 2020-07-21 – 2020-07-22 (×2): 60 mg via ORAL
  Filled 2020-07-21 (×2): qty 1

## 2020-07-21 MED FILL — Magnesium Sulfate Inj 50%: INTRAMUSCULAR | Qty: 10 | Status: AC

## 2020-07-21 MED FILL — Heparin Sodium (Porcine) Inj 1000 Unit/ML: INTRAMUSCULAR | Qty: 30 | Status: AC

## 2020-07-21 MED FILL — Thrombin (Recombinant) For Soln 20000 Unit: CUTANEOUS | Qty: 1 | Status: AC

## 2020-07-21 MED FILL — Potassium Chloride Inj 2 mEq/ML: INTRAVENOUS | Qty: 40 | Status: AC

## 2020-07-21 NOTE — Progress Notes (Signed)
2 Days Post-Op Procedure(s) (LRB): CORONARY ARTERY BYPASS GRAFTING (CABG) TIMES THREE USING LEFT INTERNAL MAMMARY ARTERY AND RIGHT GREATER SAPHENOUS VEIN HARVESTED ENDOSCOPICALLY (N/A) TRANSESOPHAGEAL ECHOCARDIOGRAM (TEE) (N/A) Subjective: C/o feeling short of breath Pain control has been an issue but better at present  Objective: Vital signs in last 24 hours: Temp:  [97.34 F (36.3 C)-98.6 F (37 C)] 98.3 F (36.8 C) (04/03 0738) Pulse Rate:  [59-94] 88 (04/03 0814) Resp:  [9-22] 13 (04/03 0827) BP: (115-185)/(60-133) 171/85 (04/03 0814) SpO2:  [93 %-100 %] 100 % (04/03 0827) Arterial Line BP: (112-126)/(56-61) 120/57 (04/02 1000) Weight:  [91.3 kg] 91.3 kg (04/03 0500)  Hemodynamic parameters for last 24 hours: PAP: (38-52)/(21-28) 40/23  Intake/Output from previous day: 04/02 0701 - 04/03 0700 In: 1557.7 [P.O.:840; I.V.:517.6; IV Piggyback:200.1] Out: 2100 [Urine:2100] Intake/Output this shift: Total I/O In: 360 [P.O.:360] Out: -   General appearance: alert, cooperative and no distress Neurologic: intact Heart: regular rate and rhythm Lungs: diminished breath sounds bibasilar Abdomen: normal findings: soft, non-tender  Lab Results: Recent Labs    07/20/20 1632 07/21/20 0420  WBC 13.5* 12.9*  HGB 12.3* 12.1*  HCT 37.9* 35.5*  PLT 175 175   BMET:  Recent Labs    07/20/20 1632 07/21/20 0420  NA 134* 134*  K 4.2 4.3  CL 101 99  CO2 21* 26  GLUCOSE 143* 151*  BUN 21 21  CREATININE 1.44* 1.25*  CALCIUM 8.4* 8.7*    PT/INR:  Recent Labs    07/19/20 1337  LABPROT 18.1*  INR 1.6*   ABG    Component Value Date/Time   PHART 7.342 (L) 07/19/2020 1755   HCO3 23.9 07/19/2020 1755   TCO2 25 07/19/2020 1755   ACIDBASEDEF 2.0 07/19/2020 1755   O2SAT 96.0 07/19/2020 1755   CBG (last 3)  Recent Labs    07/20/20 2344 07/21/20 0324 07/21/20 0736  GLUCAP 151* 134* 182*    Assessment/Plan: S/P Procedure(s) (LRB): CORONARY ARTERY BYPASS GRAFTING  (CABG) TIMES THREE USING LEFT INTERNAL MAMMARY ARTERY AND RIGHT GREATER SAPHENOUS VEIN HARVESTED ENDOSCOPICALLY (N/A) TRANSESOPHAGEAL ECHOCARDIOGRAM (TEE) (N/A) -POD # 2  CV- in SR, hypertension- increase metoprolol to home dose  Will restart HCTZ. Also restart Imdur as he has multiple allergies to BO meds, limiting options  RESP- Small left apical pneumo unchanged  Continue IS, CPAP at night  RENAL- creatinine better, lytes OK, will give IV lasix again today  ENDO- CBG elevated - continue levemir + SSI  GI- reflux better with BID Protonix and Maalox PRN  SCD + enoxaparin for DVT prophylaxis  Ambulate   LOS: 2 days    Daniel Carey 07/21/2020

## 2020-07-21 NOTE — Progress Notes (Signed)
      [address]       [place] [address]             [phone]      POD # 2 CABG Still "short winded" BP 131/83   Pulse 92   Temp 98.2 F (36.8 C) (Oral)   Resp 16   Ht 5\' 10"  (1.778 m)   Wt 91.3 kg   SpO2 100%   BMI 28.88 kg/m   Intake/Output Summary (Last 24 hours) at 07/21/2020 1845 Last data filed at 07/21/2020 1300 Gross per 24 hour  Intake 1619.28 ml  Output 825 ml  Net 794.28 ml  reports "peeing a lot" but minimal output recorded  Pain is better controlled  Remo Lipps C. Roxan Hockey, MD [facility] [phone]

## 2020-07-22 ENCOUNTER — Inpatient Hospital Stay (HOSPITAL_COMMUNITY): Payer: BC Managed Care – PPO

## 2020-07-22 ENCOUNTER — Encounter (HOSPITAL_COMMUNITY): Payer: Self-pay | Admitting: Surgery

## 2020-07-22 LAB — BASIC METABOLIC PANEL
Anion gap: 8 (ref 5–15)
BUN: 21 mg/dL (ref 8–23)
CO2: 29 mmol/L (ref 22–32)
Calcium: 8.5 mg/dL — ABNORMAL LOW (ref 8.9–10.3)
Chloride: 94 mmol/L — ABNORMAL LOW (ref 98–111)
Creatinine, Ser: 1.21 mg/dL (ref 0.61–1.24)
GFR, Estimated: >60 mL/min (ref >60)
Glucose, Bld: 101 mg/dL — ABNORMAL HIGH (ref 70–99)
Potassium: 4 mmol/L (ref 3.5–5.1)
Sodium: 131 mmol/L — ABNORMAL LOW (ref 135–145)

## 2020-07-22 LAB — CBC
HCT: 30 % — ABNORMAL LOW (ref 39.0–52.0)
Hemoglobin: 10.3 g/dL — ABNORMAL LOW (ref 13.0–17.0)
MCH: 30.4 pg (ref 26.0–34.0)
MCHC: 34.3 g/dL (ref 30.0–36.0)
MCV: 88.5 fL (ref 80.0–100.0)
Platelets: 145 10*3/uL — ABNORMAL LOW (ref 150–400)
RBC: 3.39 MIL/uL — ABNORMAL LOW (ref 4.22–5.81)
RDW: 13.2 % (ref 11.5–15.5)
WBC: 9.7 10*3/uL (ref 4.0–10.5)
nRBC: 0 % (ref 0.0–0.2)

## 2020-07-22 LAB — GLUCOSE, CAPILLARY
Glucose-Capillary: 97 mg/dL (ref 70–99)
Glucose-Capillary: 134 mg/dL — ABNORMAL HIGH (ref 70–99)
Glucose-Capillary: 95 mg/dL (ref 70–99)
Glucose-Capillary: 119 mg/dL — ABNORMAL HIGH (ref 70–99)
Glucose-Capillary: 126 mg/dL — ABNORMAL HIGH (ref 70–99)

## 2020-07-22 MED ORDER — MAGNESIUM HYDROXIDE 400 MG/5ML PO SUSP: 30.0000 mL | Freq: Every day | ORAL | Status: DC | PRN

## 2020-07-22 MED ORDER — ZOLPIDEM TARTRATE 5 MG PO TABS: 5.0000 mg | ORAL_TABLET | Freq: Every evening | ORAL | Status: DC | PRN

## 2020-07-22 MED ORDER — NIACIN 500 MG PO TABS
500.0000 mg | ORAL_TABLET | Freq: Every day | ORAL | Status: DC
  Administered 2020-07-22: 500 mg via ORAL
  Filled 2020-07-22 (×2): qty 1

## 2020-07-22 MED ORDER — SODIUM CHLORIDE 0.9% FLUSH
3.0000 mL | Freq: Two times a day (BID) | INTRAVENOUS | Status: DC
  Administered 2020-07-22 – 2020-07-23 (×3): 3 mL via INTRAVENOUS

## 2020-07-22 MED ORDER — SODIUM CHLORIDE 0.9 % IV SOLN: 250.0000 mL | INTRAVENOUS | Status: DC | PRN

## 2020-07-22 MED ORDER — ~~LOC~~ CARDIAC SURGERY, PATIENT & FAMILY EDUCATION: Freq: Once | Status: AC

## 2020-07-22 MED ORDER — CLONIDINE HCL 0.2 MG PO TABS
0.2000 mg | ORAL_TABLET | Freq: Two times a day (BID) | ORAL | Status: DC
  Administered 2020-07-22: 0.2 mg via ORAL
  Filled 2020-07-22: qty 1

## 2020-07-22 MED ORDER — CLONIDINE HCL 0.1 MG PO TABS
0.1000 mg | ORAL_TABLET | Freq: Two times a day (BID) | ORAL | Status: DC
  Administered 2020-07-23: 0.1 mg via ORAL
  Filled 2020-07-22: qty 1

## 2020-07-22 MED ORDER — SODIUM CHLORIDE 0.9% FLUSH: 3.0000 mL | INTRAVENOUS | Status: DC | PRN

## 2020-07-22 NOTE — Progress Notes (Signed)
3 Days Post-Op Procedure(s) (LRB): CORONARY ARTERY BYPASS GRAFTING (CABG) TIMES THREE USING LEFT INTERNAL MAMMARY ARTERY AND RIGHT GREATER SAPHENOUS VEIN HARVESTED ENDOSCOPICALLY (N/A) TRANSESOPHAGEAL ECHOCARDIOGRAM (TEE) (N/A) Subjective: Feels better this AM  Objective: Vital signs in last 24 hours: Temp:  [97.1 F (36.2 C)-98.9 F (37.2 C)] 97.8 F (36.6 C) (04/04 0418) Pulse Rate:  [69-104] 91 (04/04 0700) Resp:  [12-21] 17 (04/04 0700) BP: (131-181)/(66-102) 144/92 (04/04 0600) SpO2:  [89 %-100 %] 96 % (04/04 0700) Weight:  [91.1 kg] 91.1 kg (04/04 0700)  Hemodynamic parameters for last 24 hours:    Intake/Output from previous day: 04/03 0701 - 04/04 0700 In: 1080 [P.O.:1080] Out: 1525 [Urine:1525] Intake/Output this shift: No intake/output data recorded.  General appearance: alert, cooperative and no distress Neurologic: intact Heart: regular rate and rhythm Lungs: clear to auscultation bilaterally Wound: clean and dry  Lab Results: Recent Labs    07/21/20 0420 07/22/20 0334  WBC 12.9* 9.7  HGB 12.1* 10.3*  HCT 35.5* 30.0*  PLT 175 145*   BMET:  Recent Labs    07/21/20 0420 07/22/20 0334  NA 134* 131*  K 4.3 4.0  CL 99 94*  CO2 26 29  GLUCOSE 151* 101*  BUN 21 21  CREATININE 1.25* 1.21  CALCIUM 8.7* 8.5*    PT/INR:  Recent Labs    07/19/20 1337  LABPROT 18.1*  INR 1.6*   ABG    Component Value Date/Time   PHART 7.342 (L) 07/19/2020 1755   HCO3 23.9 07/19/2020 1755   TCO2 25 07/19/2020 1755   ACIDBASEDEF 2.0 07/19/2020 1755   O2SAT 96.0 07/19/2020 1755   CBG (last 3)  Recent Labs    07/21/20 1941 07/21/20 2345 07/22/20 0353  GLUCAP 150* 109* 97    Assessment/Plan: S/P Procedure(s) (LRB): CORONARY ARTERY BYPASS GRAFTING (CABG) TIMES THREE USING LEFT INTERNAL MAMMARY ARTERY AND RIGHT GREATER SAPHENOUS VEIN HARVESTED ENDOSCOPICALLY (N/A) TRANSESOPHAGEAL ECHOCARDIOGRAM (TEE) (N/A) Plan for transfer to step-down: see transfer  orders  Looks good this AM CV- stable, paced rhythm   Hypertension- multiple BP med "allergies" will start clonidine RESP- continue IS RENAL- creatinine normal and lytes Ok ENDO- CBG well controlled GI- tolerating diet Transfer to 4E Continue cardiac rehab   LOS: 3 days    Melrose Nakayama 07/22/2020

## 2020-07-22 NOTE — Progress Notes (Signed)
CARDIAC REHAB PHASE I   PRE:  Rate/Rhythm: 83 SR with paced beats  BP:  Supine:   Sitting: 149/83  Standing:    SaO2: 93%RA  MODE:  Ambulation: 370 ft   POST:  Rate/Rhythm: 101 paced  BP:  Supine:   Sitting: 145/85  Standing:    SaO2: 95%RA 1045-1120 Pt walked 370 ft on RA with hand held asst. Gait steady. Did c/o lower back pain by end of walk. To recliner. Discussed CRP 2 and referral to [facility] made. Gave [facility] booklet and staying in the tube and discussed sternal precautions and importance of IS.   Graylon Good, RN BSN  07/22/2020 11:17 AM

## 2020-07-23 ENCOUNTER — Inpatient Hospital Stay (HOSPITAL_COMMUNITY): Payer: BC Managed Care – PPO

## 2020-07-23 ENCOUNTER — Encounter: Payer: BC Managed Care – PPO | Admitting: Internal Medicine

## 2020-07-23 LAB — CBC
HCT: 30.2 % — ABNORMAL LOW (ref 39.0–52.0)
Hemoglobin: 10.4 g/dL — ABNORMAL LOW (ref 13.0–17.0)
MCH: 30.2 pg (ref 26.0–34.0)
MCHC: 34.4 g/dL (ref 30.0–36.0)
MCV: 87.8 fL (ref 80.0–100.0)
Platelets: 183 10*3/uL (ref 150–400)
RBC: 3.44 MIL/uL — ABNORMAL LOW (ref 4.22–5.81)
RDW: 13.2 % (ref 11.5–15.5)
WBC: 8 10*3/uL (ref 4.0–10.5)
nRBC: 0 % (ref 0.0–0.2)

## 2020-07-23 LAB — GLUCOSE, CAPILLARY
Glucose-Capillary: 101 mg/dL — ABNORMAL HIGH (ref 70–99)
Glucose-Capillary: 110 mg/dL — ABNORMAL HIGH (ref 70–99)
Glucose-Capillary: 122 mg/dL — ABNORMAL HIGH (ref 70–99)
Glucose-Capillary: 143 mg/dL — ABNORMAL HIGH (ref 70–99)
Glucose-Capillary: 95 mg/dL (ref 70–99)

## 2020-07-23 LAB — BASIC METABOLIC PANEL
Anion gap: 6 (ref 5–15)
BUN: 22 mg/dL (ref 8–23)
CO2: 32 mmol/L (ref 22–32)
Calcium: 8.4 mg/dL — ABNORMAL LOW (ref 8.9–10.3)
Chloride: 95 mmol/L — ABNORMAL LOW (ref 98–111)
Creatinine, Ser: 1.23 mg/dL (ref 0.61–1.24)
GFR, Estimated: >60 mL/min (ref >60)
Glucose, Bld: 96 mg/dL (ref 70–99)
Potassium: 4.3 mmol/L (ref 3.5–5.1)
Sodium: 133 mmol/L — ABNORMAL LOW (ref 135–145)

## 2020-07-23 MED ORDER — METFORMIN HCL 500 MG PO TABS
500.0000 mg | ORAL_TABLET | Freq: Two times a day (BID) | ORAL | Status: DC
  Administered 2020-07-23 (×2): 500 mg via ORAL
  Filled 2020-07-23 (×2): qty 1

## 2020-07-23 MED ORDER — INSULIN ASPART 100 UNIT/ML ~~LOC~~ SOLN: 0.0000 [IU] | SUBCUTANEOUS | Status: DC

## 2020-07-23 NOTE — Progress Notes (Signed)
Patient advised RN that he feels flush, patient advised that he just remembered that the same thing has happen to him in the past whenever he took niacin.      Patient O2 desat to 88%, patient was diaphoretic, RN placed patient on 2L [place], patient O2 saturation 100.  Patient also stated that the lovenox causes him to have muscle cramps.  RN will continue to monitor patient

## 2020-07-23 NOTE — Discharge Instructions (Signed)

## 2020-07-23 NOTE — Progress Notes (Signed)
Mobility Specialist: Progress Note   07/23/20 1810  Mobility  Activity Ambulated in hall  Level of Assistance Modified independent, requires aide device or extra time  Assistive Device Front wheel walker  Mobility Response Tolerated well  Mobility performed by Family member   Pt seen ambulating in hallway with his wife. Pt independent with RW. Will f/u tomorrow.   [facility] Kaisei Gilbo Mobility Specialist Mobility Specialist Phone: [phone]

## 2020-07-23 NOTE — Progress Notes (Signed)
Pacer wires removed per physician order. Vital signs taken immediately before and immediately after removal and stable. Patient to remain bedrest for one hour and vitals to be taken ever fifteen minutes. Patient left in a stable condition and will continue to monitor.  -Donnelly Angelica, RN

## 2020-07-23 NOTE — Progress Notes (Addendum)
      [address]       [place] [address]             [phone]      4 Days Post-Op Procedure(s) (LRB): CORONARY ARTERY BYPASS GRAFTING (CABG) TIMES THREE USING LEFT INTERNAL MAMMARY ARTERY AND RIGHT GREATER SAPHENOUS VEIN HARVESTED ENDOSCOPICALLY (N/A) TRANSESOPHAGEAL ECHOCARDIOGRAM (TEE) (N/A) Subjective: Transferred to 4E last evening. Had an episode of flushing, diaphoresis yesterday after receiving a dose of immediate release niacin.  Alert, resting in bed. Says he feels better this morning.   BM x 3 yesterday, progressing with mobility.   Objective: Vital signs in last 24 hours: Temp:  [97.8 F (36.6 C)-98.4 F (36.9 C)] 97.8 F (36.6 C) (04/05 0816) Pulse Rate:  [75-94] 82 (04/05 0816) Cardiac Rhythm: Ventricular paced;Bundle branch block;Normal sinus rhythm (04/04 1955) Resp:  [14-18] 18 (04/05 0816) BP: (98-145)/(58-85) 127/71 (04/05 0816) SpO2:  [88 %-100 %] 97 % (04/05 0816) FiO2 (%):  [21 %] 21 % (04/04 1241) Weight:  [90 kg] 90 kg (04/05 0651)  Hemodynamic parameters for last 24 hours:    Intake/Output from previous day: No intake/output data recorded. Intake/Output this shift: No intake/output data recorded.  General appearance: alert, cooperative and no distress Neurologic: intact Heart: paced rhythm (with PPM) Lungs: clear to auscultation bilaterally Abdomen: soft, NT Extremities: no peripheral edema. RLE EVH incision is intact and dry Wound: the sternotomy incision is open to air and iw well approximated and dry.   Lab Results: Recent Labs    07/22/20 0334 07/23/20 0257  WBC 9.7 8.0  HGB 10.3* 10.4*  HCT 30.0* 30.2*  PLT 145* 183   BMET:  Recent Labs    07/22/20 0334 07/23/20 0257  NA 131* 133*  K 4.0 4.3  CL 94* 95*  CO2 29 32  GLUCOSE 101* 96  BUN 21 22  CREATININE 1.21 1.23  CALCIUM 8.5* 8.4*    PT/INR: No results for input(s): LABPROT, INR in the last 72 hours. ABG    Component Value Date/Time   PHART  7.342 (L) 07/19/2020 1755   HCO3 23.9 07/19/2020 1755   TCO2 25 07/19/2020 1755   ACIDBASEDEF 2.0 07/19/2020 1755   O2SAT 96.0 07/19/2020 1755   CBG (last 3)  Recent Labs    07/22/20 2046 07/23/20 0019 07/23/20 0814  GLUCAP 126* 95 143*    Assessment/Plan: S/P Procedure(s) (LRB): CORONARY ARTERY BYPASS GRAFTING (CABG) TIMES THREE USING LEFT INTERNAL MAMMARY ARTERY AND RIGHT GREATER SAPHENOUS VEIN HARVESTED ENDOSCOPICALLY (N/A) TRANSESOPHAGEAL ECHOCARDIOGRAM (TEE) (N/A)  -POD 4 CABG x 3. Progressing well. Good mobility. D/C pacer wires today. Continue ASA, Crestor, metoprolol.  D/C niacin due to flushing.   -H/O hypertension-adequate control on  Clonidine, HCTZ, metoprolol.   -Expected acute blood loss anemia- Hct stable.   -Endo- No h/o DM but Hgb A1C was 7.3 on pre-op labs.  Add metformin 500mg  BID and continue SSI AC/HS. Will need f/u with PCP after discharge.   -H/O heart block s/p PPM- functioning appropriately, will remove the temp pacer wires today.   -DVT PPX- on daily enoxaparin.  -Disposition- anticipate discharge to home tomorrow if progress continues.     LOS: 4 days    Malon Kindle [phone] 07/23/2020 Patient seen and examined, agree with above  Remo Lipps C. Roxan Hockey, MD [facility] [phone]

## 2020-07-23 NOTE — Progress Notes (Signed)
CARDIAC REHAB PHASE I   PRE:  Rate/Rhythm: 77 paced   BP:  Supine: 126/84  Sitting:   Standing:    SaO2: 97%RA  MODE:  Ambulation: 370 ft   POST:  Rate/Rhythm: 93 paced    SaO2: 94%RA 1059-1124 Pt walked 370 ft on RA with rolling walker and tolerated well. Using walker due to seems to help hips. To sitting in recliner after walk. Knows he is to walk two more times today. Encouraged pt to hold pillow when he needs to cough.    Graylon Good, RN BSN  07/23/2020 12:00 PM

## 2020-07-23 NOTE — Discharge Summary (Signed)
Physician Discharge Summary  Patient ID: Daniel Carey MRN: [id number] DOB/AGE: 05/13/1950 70 y.o.  Admit date: 07/19/2020 Discharge date: 07/24/2020  Admission Diagnoses:  Coronary artery disease History of heart block, s/p placement of permanent pacemaker Hypertension Expected acute blood loss anemia Gastroesophageal reflux disease Dyslipidemia Hyperglycemia    Discharge Diagnoses:  Coronary artery disease S/P CABG x 3 History of heart block, s/p placement of permanent pacemaker Hypertension Expected acute blood loss anemia Gastroesophageal reflux disease Dyslipidemia Hyperglycemia   Discharged Condition: stable  History of Present Illness: The patient is a 70 year old gentleman with a history of hypertension, hyperlipidemia, remote smoking and COPD, symptomatic bradycardia and 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 He had a catheterization in July 2018 which showed multivessel coronary disease with a diffusely calcified LAD with up to 60 to 70% stenosis. The FFR at that time was 0.73. There is moderate to severe left circumflex disease with a large third marginal branch that had an FFR of 0.83. There is moderate in-stent restenosis in the mid RCA. His medical therapy was increased and he continued doing fairly well with some intermittent chest pain that appeared to be atypical.He now presents with a several month history of progressive substernal chest discomfort occurring with exertion such as walking on treadmill. He also has this with eating and generally feels very poor when it occurs. He has also had some numbness down his arms into the hands. He has associated shortness of breath. He underwent repeat cardiac catheterization on 07/12/2020 which showed severe three-vessel coronary disease. The LAD had 65% proximal and 70% mid  and distal stenoses. The left circumflex had 2 diffusely diseased small marginal branches with 90% stenoses and then a larger third marginal branch that had 90% proximal stenosis. The RCA was diffusely irregular with 60% in-stent restenosis. There was normal left ventricular systolic function.  The patient lives at home with his wife. 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  Hospital Course:   [name] was admitted for elective surgery on 07/19/20 and taken to the OR where 3-vessel coronary bypass grafting was accomplished without complication. following the procedure he separated from cardiopulmonary bypass without difficulty. He was transferred to the cardiovascular ICU in stable condition where he remained hemodynamically stable.  He was weaned from the ventilator and extubated routinely. The chest tubes and monitoring lines were removed on the first post-op day and he was mobilized.  SCD's and subcutaneous enoxaparin were utilized for DVT prophylaxis.  He was re-started on his usual metoprolol and HCTZ for management of hypertension. He was transferred to 4E Progressive Care on the 3rd post-op day where his progress continued. His permanent pacemaker demonstrated appropriate pacing and the temporary pacing wires were removed on the 4th post-op day. His glucose was monitored throughout the admission and sliding scale insulin coverage provided as required. He was noted to have an admission Hgb A1C of 7.3. Metformin 500mg  BID was initiated and he was advised to have close follow up with his primary care provider regarding diabetes mellitus after discharge.  Diet and activity were advance and well tolerated. He had return of appropriate bowel and bladder function. His incisions were healing with no sign of complication at the time of discharge.    Consults: None  Significant Diagnostic Studies:   EXAM: CHEST -  2 VIEW  COMPARISON:   07/2020.  FINDINGS: Cardiac pacer in stable position. Prior CABG. Heart size stable. No focal infiltrate. Mild right base subsegmental atelectasis. Tiny left pleural effusion versus pleural scarring. No pneumothorax.  IMPRESSION: 1. Cardiac pacer in stable position. Prior CABG. Heart size stable.  2.  Mild right base subsegmental atelectasis.  3.  Tiny left pleural effusion versus pleural scarring   Electronically Signed   By: Marcello Moores  Register   On: 07/23/2020 06:58  Treatments:   CARDIOVASCULAR SURGERY OPERATIVE NOTE  07/19/2020  Surgeon:  Gaye Pollack, MD  First Assistant: Enid Cutter,  PA-C   Preoperative Diagnosis:  Severe multi-vessel coronary artery disease   Postoperative Diagnosis:  Same   Procedure:  1. Median Sternotomy 2. Extracorporeal circulation 3.   Coronary artery bypass grafting x 3   Left internal mammary artery graft to the LAD  SVG to OM3  SVG to PDA  4.   Endoscopic vein harvest from the right leg   Anesthesia:  General Endotracheal   Clinical History/Surgical Indication:  This 70 year old gentleman has severe multivessel coronary disease with diffusely diseased and calcified vessels. He had anabnormal FFR in the LAD by catheterization in 2018.He has progressively worsening anginal symptoms that are limiting his lifestyle and ability to remain active. I agree that coronary artery bypass graft surgery is the best treatment.His preoperative Doppler examination shows that the left upper extremity is dependent on the radial and ulnar arteries and therefore left radial artery bypass cannot be used. The right radial artery was used for catheterization and therefore I would not use that artery for grafting.I would not use bilateral internal mammary artery grafts in this patient due to his history of diabetes and remote smoking with moderate emphysematous lung changes on previous chest CT in 2013.His  carotid Doppler examination showed a 40 to 59% right ICA stenosis and 60 to 79% left ICA stenosis. There is greater than 50% left common carotid artery stenosis. The patient is asymptomatic and does not require treatment for these at this time but will require vascular surgery follow-up.  I discussed the operative procedure with the patient andhis wifeincluding alternatives, benefits and risks; including but not limited to bleeding, blood transfusion, infection, stroke, myocardial infarction, graft failure, heart block requiring a permanent pacemaker, organ dysfunction, and death. [place] and agrees to proceed.   Discharge Exam: Blood pressure 132/75, pulse 82, temperature 98.5 F (36.9 C), temperature source Oral, resp. rate 18, height 5\' 10"  (1.778 m), weight 88.1 kg, SpO2 98 %. General appearance: alert, cooperative and no distress Neurologic: intact Heart: paced rhythm (with PPM) Lungs: clear to auscultation bilaterally Abdomen: soft, NT Extremities: no peripheral edema. RLE EVH incision is intact and dry Wound: the sternotomy incision is well approximated and dry.   Disposition:  There are no questions and answers to display.        Discharge Instructions    Amb Referral to Cardiac Rehabilitation   Complete by: As directed    Diagnosis: CABG   CABG X ___: 3   After initial evaluation and assessments completed: Virtual Based Care may be provided alone or in conjunction with Phase 2 Cardiac Rehab based on patient barriers.: Yes     Allergies as of 07/24/2020      Reactions   Amlodipine Besylate Other (See Comments)   Headache, weakness, dizziness   Propofol Other (See Comments)   Heart rate dropped   Lisinopril Cough, Rash   Rosuvastatin Rash, Other (See Comments)  muscle aches   Metoprolol    Lowered heart rate too much       Medication List    STOP taking these medications   isosorbide mononitrate 60 MG 24 hr tablet Commonly known as:  IMDUR   omeprazole 20 MG capsule Commonly known as: PRILOSEC     TAKE these medications   acetaminophen 500 MG tablet Commonly known as: TYLENOL Take 1,000 mg by mouth every 6 (six) hours as needed (pain).   albuterol 108 (90 Base) MCG/ACT inhaler Commonly known as: VENTOLIN HFA INHALE 2 PUFFS INTO THE LUNGS EVERY 6 HOURS AS NEEDED FOR WHEEZE OR SHORTNESS OF BREATH What changed: See the new instructions.   aspirin 325 MG EC tablet Take 1 tablet (325 mg total) by mouth daily. What changed:   medication strength  how much to take  when to take this  additional instructions   cloNIDine 0.1 MG tablet Commonly known as: CATAPRES Take 1 tablet (0.1 mg total) by mouth 2 (two) times daily.   famotidine 20 MG tablet Commonly known as: PEPCID Take 20 mg by mouth daily as needed for heartburn.   Fenofibric Acid 135 MG Cpdr TAKE 1 CAPSULE BY MOUTH EVERY DAY What changed:   how much to take  when to take this   hydrochlorothiazide 25 MG tablet Commonly known as: HYDRODIURIL TAKE 1/2 TABLET BY MOUTH EVERY DAY   metoprolol tartrate 25 MG tablet Commonly known as: LOPRESSOR Take 1 tablet (25 mg total) by mouth 2 (two) times daily. Take addition one tablet 1 hr prior to exercise   niacin 500 MG tablet Take 500 mg by mouth at bedtime.   nitroGLYCERIN 0.4 MG SL tablet Commonly known as: Nitrostat Place 1 tablet (0.4 mg total) under the tongue every 5 (five) minutes as needed for chest pain.   oxyCODONE-acetaminophen 5-325 MG tablet Commonly known as: Percocet Take 1 tablet by mouth every 4 (four) hours as needed for up to 5 days for severe pain.   RABEprazole 20 MG tablet Commonly known as: ACIPHEX Take 1 tablet (20 mg total) by mouth daily in the afternoon.   ranolazine 500 MG 12 hr tablet Commonly known as: Ranexa Take 1 tablet (500 mg total) by mouth 2 (two) times daily.   rosuvastatin 20 MG tablet Commonly known as: CRESTOR TAKE 1 TABLET BY MOUTH EVERY DAY        Follow-up Information    Verta Ellen., NP. Go on 08/08/2020.   Specialty: Cardiology Why: Your appointment for cardiology follow-up visit at 11:30 AM. Contact information: [place] [address] [phone]        Gaye Pollack, MD. Go on 08/21/2020.   Specialty: Cardiothoracic Surgery Why: Your appointment with Dr. Caffie Pinto is at 1230pm. Please arrive 30 minutes early for chest x-ray to be performed by [facility] located on the first floor of the same building. Contact information: [address] [name] [address] [phone]        [facility] [place]. Go on 07/31/2020.   Specialty: Cardiothoracic Surgery Why: Your appointment for suture removal is at 11:15am. Contact information: [place], [place] [phone]             The patient has been discharged on:   1.Beta Blocker:  Yes [ x  ]  No   [   ]                              If No, reason:  2.Ace Inhibitor/ARB: Yes [   ]                                     No  [  x  ]                                     If No, reason: ACE-I allergy 3.Statin:   Yes [ x  ]                  No  [   ]                  If No, reason:  4.Ecasa:  Yes  [  x ]                  No   [   ]                  If No, reason:    Signed: Antony Odea, PA-C 07/24/2020, 10:44 AM

## 2020-07-24 ENCOUNTER — Ambulatory Visit: Payer: BC Managed Care – PPO | Admitting: Internal Medicine

## 2020-07-24 ENCOUNTER — Telehealth: Payer: Self-pay

## 2020-07-24 LAB — GLUCOSE, CAPILLARY
Glucose-Capillary: 100 mg/dL — ABNORMAL HIGH (ref 70–99)
Glucose-Capillary: 124 mg/dL — ABNORMAL HIGH (ref 70–99)

## 2020-07-24 MED ORDER — CLONIDINE HCL 0.1 MG PO TABS: 0.1000 mg | ORAL_TABLET | Freq: Two times a day (BID) | ORAL | 11 refills | Status: DC

## 2020-07-24 MED ORDER — OXYCODONE-ACETAMINOPHEN 5-325 MG PO TABS: 1.0000 | ORAL_TABLET | ORAL | 0 refills | Status: AC | PRN

## 2020-07-24 MED ORDER — ASPIRIN 325 MG PO TBEC: 325.0000 mg | DELAYED_RELEASE_TABLET | Freq: Every day | ORAL | 0 refills | Status: DC

## 2020-07-24 NOTE — Progress Notes (Signed)
Mobility Specialist: Progress Note   07/24/20 1200  Mobility  Activity Off unit   Pt discharging.   [facility] Mobility Specialist Mobility Specialist Phone: [phone]

## 2020-07-24 NOTE — Progress Notes (Addendum)
      [address]       [place] [address]             [phone]      5 Days Post-Op Procedure(s) (LRB): CORONARY ARTERY BYPASS GRAFTING (CABG) TIMES THREE USING LEFT INTERNAL MAMMARY ARTERY AND RIGHT GREATER SAPHENOUS VEIN HARVESTED ENDOSCOPICALLY (N/A) TRANSESOPHAGEAL ECHOCARDIOGRAM (TEE) (N/A) Subjective: Had a good day yesterday, no new problems or concerns.  He would like to go home.   Objective: Vital signs in last 24 hours: Temp:  [97.8 F (36.6 C)-98.5 F (36.9 C)] 98.4 F (36.9 C) (04/06 0434) Pulse Rate:  [75-86] 75 (04/06 0434) Cardiac Rhythm: Ventricular paced;Normal sinus rhythm;Bundle branch block (04/05 1955) Resp:  [14-20] 16 (04/06 0434) BP: (122-171)/(71-92) 132/78 (04/06 0434) SpO2:  [94 %-99 %] 96 % (04/06 0434) Weight:  [88.1 kg] 88.1 kg (04/06 0434)     Intake/Output from previous day: 04/05 0701 - 04/06 0700 In: 960 [P.O.:960] Out: -  Intake/Output this shift: No intake/output data recorded.  General appearance: alert, cooperative and no distress Neurologic: intact Heart: paced rhythm (with PPM) Lungs: clear to auscultation bilaterally Abdomen: soft, NT Extremities: no peripheral edema. RLE EVH incision is intact and dry Wound: the sternotomy incision is well approximated and dry.   Lab Results: Recent Labs    07/22/20 0334 07/23/20 0257  WBC 9.7 8.0  HGB 10.3* 10.4*  HCT 30.0* 30.2*  PLT 145* 183   BMET:  Recent Labs    07/22/20 0334 07/23/20 0257  NA 131* 133*  K 4.0 4.3  CL 94* 95*  CO2 29 32  GLUCOSE 101* 96  BUN 21 22  CREATININE 1.21 1.23  CALCIUM 8.5* 8.4*    PT/INR: No results for input(s): LABPROT, INR in the last 72 hours. ABG    Component Value Date/Time   PHART 7.342 (L) 07/19/2020 1755   HCO3 23.9 07/19/2020 1755   TCO2 25 07/19/2020 1755   ACIDBASEDEF 2.0 07/19/2020 1755   O2SAT 96.0 07/19/2020 1755   CBG (last 3)  Recent Labs    07/23/20 1601 07/23/20 2132 07/24/20 0616   GLUCAP 101* 110* 100*    Assessment/Plan: S/P Procedure(s) (LRB): CORONARY ARTERY BYPASS GRAFTING (CABG) TIMES THREE USING LEFT INTERNAL MAMMARY ARTERY AND RIGHT GREATER SAPHENOUS VEIN HARVESTED ENDOSCOPICALLY (N/A) TRANSESOPHAGEAL ECHOCARDIOGRAM (TEE) (N/A)  -POD 5 CABG x 3. Progressing well. Independent with mobility. Only 1kg above pre-op Wt.   -H/O hypertension-adequate control on  Clonidine, HCTZ, metoprolol.   -Expected acute blood loss anemia- Hct stablilized  -Endo- No h/o DM but Hgb A1C was 7.3 on pre-op labs.  He said he is followed regularly by an endocrinologist for his hyperglycemia and has follow up already scheduled next month. He also said he follows a reduced carb diet at home.   -H/O heart block s/p PPM- functioning appropriately  -Disposition- plan for discharge to home today.    LOS: 5 days    Antony Odea, PA-C [phone] 07/24/2020

## 2020-07-24 NOTE — Progress Notes (Signed)
[id number] Education completed with pt and wife who voiced understanding. Reviewed sternal precautions, IS, walking for exercise, gave diabetic and heart healthy diets, and he is referred to [facility] 2. Discussed wound care. Graylon Good RN BSN 07/24/2020 10:24 AM

## 2020-07-24 NOTE — Telephone Encounter (Signed)
FMLA form complete, patient's wife Hoyle Sauer was notified and she will pick up at front desk. Beginning leave 07/19/20 through 10/14/20

## 2020-07-26 ENCOUNTER — Telehealth: Payer: Self-pay

## 2020-07-26 MED FILL — Heparin Sodium (Porcine) Inj 1000 Unit/ML: INTRAMUSCULAR | Qty: 10 | Status: AC

## 2020-07-26 MED FILL — Sodium Bicarbonate IV Soln 8.4%: INTRAVENOUS | Qty: 50 | Status: AC

## 2020-07-26 MED FILL — Sodium Chloride IV Soln 0.9%: INTRAVENOUS | Qty: 2000 | Status: AC

## 2020-07-26 MED FILL — Lidocaine HCl Local Soln Prefilled Syringe 100 MG/5ML (2%): INTRAMUSCULAR | Qty: 5 | Status: AC

## 2020-07-26 MED FILL — Mannitol IV Soln 20%: INTRAVENOUS | Qty: 500 | Status: AC

## 2020-07-26 MED FILL — Electrolyte-R (PH 7.4) Solution: INTRAVENOUS | Qty: 4000 | Status: AC

## 2020-07-26 NOTE — Telephone Encounter (Cosign Needed)
Transition Care Management Follow-up Telephone Call  Date of discharge and from where: [facility] 07/24/20  How have you been since you were released from the hospital? Very well and sore   Any questions or concerns? No  Items Reviewed:  Did the pt receive and understand the discharge instructions provided? Yes   Medications obtained and verified? Yes   Other? No   Any new allergies since your discharge? No   Dietary orders reviewed? Yes  Do you have support at home? Yes   Home Care and Equipment/Supplies: Were home health services ordered? not applicable If so, what is the name of the agency?   Has the agency set up a time to come to the patient's home? not applicable Were any new equipment or medical supplies ordered?  No What is the name of the medical supply agency?  Were you able to get the supplies/equipment?  Do you have any questions related to the use of the equipment or supplies? No  Functional Questionnaire: (I = Independent and D = Dependent) ADLs: I  Bathing/Dressing- I  Meal Prep- I, wife assist   Eating- I  Maintaining continence- I  Transferring/Ambulation- I  Managing Meds- I  Follow up appointments reviewed:   PCP Hospital f/u appt confirmed? No    Specialist Hospital f/u appt confirmed? Yes  Scheduled to see Cardiology for suture removal and follow up  on 4/13, 4/21, 08/21/20.  Are transportation arrangements needed? No   If their condition worsens, is the pt aware to call PCP or go to the Emergency Dept.? Yes  Was the patient provided with contact information for the PCP's office or ED? Yes  Was to pt encouraged to call back with questions or concerns? Yes

## 2020-07-31 ENCOUNTER — Other Ambulatory Visit: Payer: Self-pay

## 2020-07-31 ENCOUNTER — Ambulatory Visit (INDEPENDENT_AMBULATORY_CARE_PROVIDER_SITE_OTHER): Payer: Self-pay | Admitting: *Deleted

## 2020-07-31 VITALS — BP 159/96 | HR 71

## 2020-07-31 DIAGNOSIS — Z4802 Encounter for removal of sutures: Secondary | ICD-10-CM

## 2020-07-31 NOTE — Progress Notes (Signed)
Patient arrived for nurse visit to remove sutures post-CABG 4/1 by Dr. Cyndia Bent. Three sutures removed with no signs or symptoms of infection noted.  Incisions well approximated. Patient tolerated suture removal well.  Patient and family instructed to keep the incision site clean and dry. Patient and family acknowledged instructions given. Patient had multiple questions regarding medications. States He has been taking clonidine and metoprolol as prescribed, however, his blood pressures have been dropping with recently recorded BP 94/55. Patient states he feels dizzy and lightheaded when this happens. Patient states he stopped taking clonidine two nights ago. This mornings BP was 124/77. At visit BP 159/96 HR 71. Per E. Barrett, PA, patient advised to stop taking clonidine until he follows up with cardiology on 4/21.  VM left for patient to confirm it is okay to stop taking the clonidine. All questions answered.

## 2020-08-01 ENCOUNTER — Ambulatory Visit (INDEPENDENT_AMBULATORY_CARE_PROVIDER_SITE_OTHER): Payer: BC Managed Care – PPO | Admitting: Internal Medicine

## 2020-08-01 ENCOUNTER — Other Ambulatory Visit: Payer: Self-pay

## 2020-08-01 ENCOUNTER — Encounter: Payer: Self-pay | Admitting: Internal Medicine

## 2020-08-01 VITALS — BP 120/82 | HR 88 | Ht 70.0 in | Wt 183.6 lb

## 2020-08-01 DIAGNOSIS — E663 Overweight: Secondary | ICD-10-CM

## 2020-08-01 DIAGNOSIS — E1165 Type 2 diabetes mellitus with hyperglycemia: Secondary | ICD-10-CM

## 2020-08-01 DIAGNOSIS — E039 Hypothyroidism, unspecified: Secondary | ICD-10-CM | POA: Diagnosis not present

## 2020-08-01 MED ORDER — FENOFIBRIC ACID 135 MG PO CPDR: 1.0000 | DELAYED_RELEASE_CAPSULE | Freq: Every day | ORAL | 11 refills | Status: DC

## 2020-08-01 MED ORDER — METFORMIN HCL 500 MG PO TABS: 500.0000 mg | ORAL_TABLET | Freq: Two times a day (BID) | ORAL | 3 refills | Status: DC

## 2020-08-01 NOTE — Progress Notes (Signed)
Patient ID: CAYLEB JARNIGAN, male   DOB: [birth year], 70 y.o.   MRN: [id number]  This visit occurred during the SARS-CoV-2 public health emergency.  Safety protocols were in place, including screening questions prior to the visit, additional usage of staff PPE, and extensive cleaning of exam room while observing appropriate contact time as indicated for disinfecting solutions.   HPI  KANAAN KAGAWA is a 70 y.o.-year-old male, returning for f/u for hypothyroidism, dx 2012 and DM2, diet-controlled, non-insulin-dependent, with complications (CAD).  Last visit 4 months ago.  Interim history: Patient had recent CABG 07/19/2020.  Sugars increased around this time and she started metformin.  However, he did not continue it after the discharge from the hospital pending this appointment. At his visit, he tells me that sugars are still high, above target.  He is still recovering after the surgery.  He cannot exercise yet.  He will have cardiac rehab in the near future.  Pt. has been dx with hypothyroidism in 2012 >> he was on levothyroxine up to 200 mcg in 2016 >> but did not feel well so he stopped.  Afterwards, his TFTs fluctuated but in the last 3 years they have been in the normal range.  We are following him without medication.  Reviewed previous TFTs: Lab Results  Component Value Date   TSH 3.96 03/20/2020   TSH 3.83 08/22/2019   TSH 3.99 01/31/2018   TSH 4.50 08/02/2017   TSH 4.89 (H) 12/08/2016   TSH 4.13 03/06/2016   TSH 9.84 (H) 09/05/2015   TSH 0.23 (L) 05/07/2015   TSH 2.49 08/02/2014   TSH 4.695 (H) 10/26/2013   FREET4 0.75 03/20/2020   FREET4 0.80 01/31/2018   FREET4 0.65 08/02/2017   FREET4 0.83 12/08/2016   FREET4 0.67 03/06/2016   FREET4 0.66 09/05/2015   FREET4 1.25 05/07/2015   FREET4 0.79 08/02/2014    Lab Results  Component Value Date   T3FREE 3.0 03/20/2020   T3FREE 2.8 01/31/2018   T3FREE 2.6 08/02/2017   T3FREE 3.3 12/08/2016   T3FREE 3.1 03/06/2016   T3FREE  3.2 09/05/2015   T3FREE 3.9 05/07/2015   T3FREE 2.9 08/02/2014   T3FREE 2.8 07/20/2011   Pt denies: - feeling nodules in neck - hoarseness - dysphagia - choking - SOB with lying down    DM2:  Reviewed HbA1c levels: Lab Results  Component Value Date   HGBA1C 7.3 (H) 07/18/2020   HGBA1C 6.5 (A) 03/20/2020   HGBA1C 7.5 (H) 08/22/2019  07/03/2016: HbA1c 6.8%  Since last visit, he was started Metformin in the hospital only >> now off.He tolerated it well.  He eliminated sweets and fried foods.  He checks his sugars 3x a day: - am: 95-168 >> 140, 159, 179 - 2h after b'fast: n/c - lunch: 136 >> 135, 140-160 - 2h after lunch: 100 >> n/c - dinner: 120, 121 >> 150-174 - 2h after dinner: n/c - bedtime: n/c  Meter: ReliOn  + CKD. Last BUN/Cr: Lab Results  Component Value Date   BUN 22 07/23/2020   BUN 21 07/22/2020   BUN 21 07/21/2020   CREATININE 1.23 07/23/2020   CREATININE 1.21 07/22/2020   CREATININE 1.25 (H) 07/21/2020  He is off ACE inhibitor/ARB due to history of high potassium.  Latest potassium levels have been normal: Lab Results  Component Value Date   K 4.3 07/23/2020   K 4.0 07/22/2020   K 4.3 07/21/2020   K 4.2 07/20/2020   K 4.5 07/20/2020   +  HL: Lab Results  Component Value Date   CHOL 146 07/05/2020   HDL 46 07/05/2020   LDLCALC 71 07/05/2020   LDLDIRECT 107.0 02/27/2013   TRIG 174 (H) 07/05/2020   CHOLHDL 3.2 07/05/2020  On Crestor 20, Niacin, and fenofibrate.  Last eye exam in  2021: reportedly; No DR, no glaucoma, + incipient cataracts.  He sees Dr Acie Fredrickson >> had angioplasy x3 in the past, and 2 stents placed. He he has a history of an AMI 15 (mild). He has a Mobitz 1 AVB. Had a catheterization in 10/2016 >> partial blockages. He has OSA and wears a CPAP He had a colonoscopy 02/2017 >> E coli sepsis. He stays active playing golf, but not since his CABG.  ROS: Constitutional: no weight gain/no weight loss, no fatigue, no subjective  hyperthermia, no subjective hypothermia Eyes: no blurry vision, no xerophthalmia ENT: no sore throat, no nodules palpated in neck, no dysphagia, no odynophagia, no hoarseness Cardiovascular: no CP/no SOB/no palpitations/no leg swelling Respiratory: no cough/no SOB/no wheezing Gastrointestinal: no N/no V/no D/no C/no acid reflux Musculoskeletal: no muscle aches/no joint aches Skin: no rashes, no hair loss Neurological: no tremors/no numbness/no tingling/no dizziness  I reviewed pt's medications, allergies, PMH, social hx, family hx, and changes were documented in the history of present illness. Otherwise, unchanged from my initial visit note.  Past Medical History:  Diagnosis Date  . Arthritis    left wrist  . 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 CAD (coronary artery disease)    s/p atherectomy, PTCA/DES x2 mid & distal RCA 06/30/12  . Chest pain   . Complication of anesthesia    heart rate dropping with propofol last procedure  . COPD (chronic obstructive pulmonary disease) ([place])   . Dyspnea   . ED (erectile dysfunction)   . Esophageal stricture   . External hemorrhoids without mention of complication   . GERD (gastroesophageal reflux disease)   . Headache(784.0)   . Hiatal hernia   . History of kidney stones   . HLD (hyperlipidemia)   . HTN (hypertension)   . Myocardial infarction ([place])    1996  . Personal history of colonic polyps 03/12/2010   TUBULAR ADENOMA  . Presence of permanent cardiac pacemaker   . Tobacco abuse   . Tubular adenoma of colon    Past Surgical History:  Procedure Laterality Date  . ANGIOPLASTY     X3  . BACK SURGERY    . CARDIAC CATHETERIZATION     8 years ago stents  . COLONOSCOPY N/A 02/16/2017   Procedure: COLONOSCOPY;  Surgeon: Jerene Bears, MD;  Location: [facility];  Service: Gastroenterology;  Laterality: N/A;  . CORONARY ARTERY BYPASS GRAFT N/A 07/19/2020   Procedure: CORONARY ARTERY BYPASS GRAFTING (CABG) TIMES THREE  USING LEFT INTERNAL MAMMARY ARTERY AND RIGHT GREATER SAPHENOUS VEIN HARVESTED ENDOSCOPICALLY;  Surgeon: Gaye Pollack, MD;  Location: [employer] [facility];  Service: Open Heart Surgery;  Laterality: N/A;  . INSERT / REPLACE / REMOVE PACEMAKER  06/20/2018  . LEFT HEART CATH AND CORONARY ANGIOGRAPHY N/A 11/05/2016   Procedure: Left Heart Cath and Coronary Angiography;  Surgeon: Nelva Bush, MD;  Location: [facility];  Service: Cardiovascular;  Laterality: N/A;  . LEFT HEART CATH AND CORONARY ANGIOGRAPHY N/A 07/12/2020   Procedure: LEFT HEART CATH AND CORONARY ANGIOGRAPHY;  Surgeon: Burnell Blanks, MD;  Location: [place];  Service: Cardiovascular;  Laterality: N/A;  . LUMBAR LAMINECTOMY/DECOMPRESSION  MICRODISCECTOMY N/A 05/12/2018   Procedure: Microlumbar decompression L2-3, possible L1-2;  Surgeon: Susa Day, MD;  Location: [facility];  Service: Orthopedics;  Laterality: N/A;  2 hrs, Microlumbar decompression L2-3, possible L1-2  . PACEMAKER IMPLANT N/A 06/20/2018   Procedure: PACEMAKER IMPLANT;  Surgeon: Evans Lance, MD;  Location: [place] [facility];  Service: Cardiovascular;  Laterality: N/A;  . PERCUTANEOUS CORONARY ROTOBLATOR INTERVENTION (PCI-R) N/A 06/30/2012   Procedure: PERCUTANEOUS CORONARY ROTOBLATOR INTERVENTION (PCI-R);  Surgeon: Burnell Blanks, MD;  Location[facility];  Service: Cardiovascular;  Laterality: N/A;  . TEE WITHOUT CARDIOVERSION N/A 07/19/2020   Procedure: TRANSESOPHAGEAL ECHOCARDIOGRAM (TEE);  Surgeon: Gaye Pollack, MD;  Location: [facility];  Service: Open Heart Surgery;  Laterality: N/A;  . VIDEO BRONCHOSCOPY  08/13/2011   Procedure: VIDEO BRONCHOSCOPY WITHOUT FLUORO;  Surgeon: Tanda Rockers, MD;  Location: [facility];  Service: Cardiopulmonary;  Laterality: Bilateral;   History   Social History  . 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 Smoking status: Former Smoker -- 0.50 packs/day for 40 years    Types: Cigarettes    Quit date: 08/25/2011  . Smokeless tobacco: Never Used  . Alcohol Use: 0.0 oz/week     Comment: rare  . Drug Use: No   Current Outpatient Medications on File Prior to Visit  Medication Sig Dispense Refill  . acetaminophen (TYLENOL) 500 MG tablet Take 1,000 mg by mouth every 6 (six) hours as needed (pain).     Marland Kitchen albuterol (VENTOLIN HFA) 108 (90 Base) MCG/ACT inhaler INHALE 2 PUFFS INTO THE LUNGS EVERY 6 HOURS AS NEEDED FOR WHEEZE OR SHORTNESS OF BREATH (Patient taking differently: Inhale 2 puffs into the lungs every 6 (six) hours as needed for wheezing or shortness of breath.) 18 each 3  . aspirin EC 325 MG EC tablet Take 1 tablet (325 mg total) by mouth daily. 30 tablet 0  . Choline Fenofibrate (FENOFIBRIC ACID) 135 MG CPDR TAKE 1 CAPSULE BY MOUTH EVERY DAY (Patient taking differently: Take 135 mg by mouth every evening.) 30 capsule 11  . cloNIDine (CATAPRES) 0.1 MG tablet Take 1 tablet (0.1 mg total) by mouth 2 (two) times daily. 60 tablet 11  . famotidine (PEPCID) 20 MG tablet Take 20 mg by mouth daily as needed for heartburn.    . hydrochlorothiazide (HYDRODIURIL) 25 MG tablet TAKE 1/2 TABLET BY MOUTH EVERY DAY (Patient taking differently: Take 12.5 mg by mouth daily.) 15 tablet 11  . metoprolol tartrate (LOPRESSOR) 25 MG tablet Take 1 tablet (25 mg total) by mouth 2 (two) times daily. Take addition one tablet 1 hr prior to exercise 225 tablet 1  . niacin 500 MG tablet Take 500 mg by mouth at bedtime.    . nitroGLYCERIN (NITROSTAT) 0.4 MG SL tablet Place 1 tablet (0.4 mg total) under the tongue every 5 (five) minutes as needed for chest pain. 25 tablet 6  . RABEprazole (ACIPHEX) 20 MG tablet Take 1 tablet (20 mg total) by mouth daily in the afternoon. 30 tablet 11  . ranolazine (RANEXA) 500 MG 12 hr tablet Take 1 tablet (500 mg total) by mouth 2 (two) times daily. 180 tablet 3  . rosuvastatin  (CRESTOR) 20 MG tablet TAKE 1 TABLET BY MOUTH EVERY DAY (Patient taking differently: Take 20 mg by mouth daily.) 30 tablet 11  No current facility-administered medications on file prior to visit.   Allergies  Allergen Reactions  . Amlodipine Besylate Other (See Comments)    Headache, weakness, dizziness  . Propofol Other (See Comments)    Heart rate dropped    . Lisinopril Cough and Rash  . Rosuvastatin Rash and Other (See Comments)     muscle aches   . Metoprolol     Lowered heart rate too much    Family History  Problem Relation Age of Onset  . Heart disease Father        hx of MI  . Diabetes Mother        and brother   . Kidney failure Mother   . Breast cancer Sister   . Colon cancer Neg Hx   . Stomach cancer Neg Hx    PE: BP 120/82 (BP Location: Right Arm, Patient Position: Sitting, Cuff Size: Normal)   Pulse 88   Ht 5\' 10"  (1.778 m)   Wt 183 lb 9.6 oz (83.3 kg)   SpO2 97%   BMI 26.34 kg/m  Wt Readings from Last 3 Encounters:  08/01/20 183 lb 9.6 oz (83.3 kg)  07/24/20 194 lb 4.8 oz (88.1 kg)  07/18/20 191 lb 12.8 oz (87 kg)   Constitutional: Slightly overweight, in NAD Eyes: PERRLA, EOMI, no exophthalmos ENT: moist mucous membranes, no thyromegaly, no cervical lymphadenopathy Cardiovascular: RRR, No MRG Respiratory: CTA B Gastrointestinal: abdomen soft, NT, ND, BS+ Musculoskeletal: no deformities, strength intact in all 4 Skin: moist, warm, no rashes Neurological: no tremor with outstretched hands, DTR normal in all 4  ASSESSMENT: 1. Mild hypothyroidism  2. DM2, diet- controlled - + CAD, s/p CABG 07/19/2020  3.  Overweight  PLAN:  1. Patient with history of mild hypothyroidism, not on levothyroxine - latest thyroid labs reviewed with pt >> normal: Lab Results  Component Value Date   TSH 3.96 03/20/2020  - he continues without levothyroxine - pt feels good, without hypothyroid symptoms and signs - will check thyroid tests at next visit  2.  DM2 -At last visit, HbA1c was 6.5%, improved, however, since then, he had another HbA1c before his CABG surgery and this was higher, at 7.3%.  Upon questioning, he did not see higher blood sugars at home before the surgery, but they were told and he was treated with metformin.  He used a low dose but responded very well to this.  He tolerated it well.  He was advised to stay off metformin now discharge until he sees me.  As of now sugars are elevated, above target.  I advised him to start a low-dose metformin that may need to be increased to have maximal dose as needed and as tolerated.  I did advise him that after he recovers from the surgery, he may expect the blood sugars to improve - advised to check sugars at different times of the day - 1x a day, rotating check times - advised for yearly eye exams >> he is UTD - return to clinic in 3 months  3.  Overweight -In the past, I recommended a low-fat plant-based diet and gave him materials.  He did not adopt this, however, he continues to limit sweets and eliminate fried foods -Weight was stable at last visit, but retained a lot of fluid around the time of surgery and now he is losing the weight back. -He is -8 pounds net since last visit -We will start metformin which is weight stabilizing long-term  Philemon Kingdom,  MD PhD [facility] Endocrinology

## 2020-08-01 NOTE — Patient Instructions (Addendum)
Please restart Metformin 500 mg with dinner x 7 days. If you tolerate this well, and sugars are still elevated, then add another Metformin tablet (500 mg) with dinner.  Please return in 3 months with your sugar log.

## 2020-08-04 ENCOUNTER — Other Ambulatory Visit: Payer: Self-pay | Admitting: Family Medicine

## 2020-08-07 ENCOUNTER — Encounter: Payer: Self-pay | Admitting: Family Medicine

## 2020-08-07 NOTE — Progress Notes (Signed)
Cardiology Office Note  Date: 08/08/2020   ID: Daniel Carey, DOB December 14, 1950, MRN [id number]  PCP:  Vivi Barrack, MD  Cardiologist:  Mertie Moores, MD Electrophysiologist:  None   Chief Complaint: Hospital follow up CABG x 3  History of Present Illness: Daniel Carey is a 70 y.o. male with a history of HTN, HLD, COPD, symptomatic bradycardia with Mobitz type I AV block status post permanent pacemaker, CAD/MI, carotid artery disease, DM2.  Recent admission on 07/19/2020 after presentation with several month history of progressive substernal chest discomfort with exertion.  Having some associated shortness of breath.  Underwent repeat cardiac catheterization on 07/12/2020 demonstrating severe three-vessel coronary artery disease.  He was admitted on 07/19/2020 for elective cardiac surgery and underwent three-vessel coronary bypass graft with LIMA to LAD, SVG to OM 3, SVG to PDA.   He is here for follow-up today status post three-vessel CABG by Dr. Caffie Pinto on 07/19/2020.  States has been having issues with fluctuating heart rates and blood pressures.  He states he was adjusting his metoprolol up and down "as I was told in the past by Dr. Acie Fredrickson".  Advised him he needed to take the dose as directed now since he has had bypass surgery.  He states he missed his recent pacemaker check due to being in the hospital for bypass surgery.  He would like to follow-up with Dr. Lovena Le earlier due to fluctuating heart rate and missed remote device check.  He states he stopped his clonidine due to low blood pressures.  Blood pressure today is 124/82.  Heart rate 62.  States his median sternotomy sites chest tube sites, saphenous graft site all look good.  He states he started metformin last Thursday and had been having issues with heart rate fluctuating since that time.  He states the fluctuating heart rate has calmed down for now.  Past Medical History:  Diagnosis Date  . Arthritis    left wrist  . AV block,  Mobitz 1    noted on EKG March 2013  . CAD (coronary artery disease)    s/p atherectomy, PTCA/DES x2 mid & distal RCA 06/30/12  . Chest pain   . Complication of anesthesia    heart rate dropping with propofol last procedure  . COPD (chronic obstructive pulmonary disease) ([place])   . Dyspnea   . ED (erectile dysfunction)   . Esophageal stricture   . External hemorrhoids without mention of complication   . GERD (gastroesophageal reflux disease)   . Headache(784.0)   . Hiatal hernia   . History of kidney stones   . HLD (hyperlipidemia)   . HTN (hypertension)   . Myocardial infarction ([place])    1996  . Personal history of colonic polyps 03/12/2010   TUBULAR ADENOMA  . Presence of permanent cardiac pacemaker   . Tobacco abuse   . Tubular adenoma of colon   . Type 2 diabetes mellitus ([place])     Past Surgical History:  Procedure Laterality Date  . ANGIOPLASTY     X3  . BACK SURGERY    . CARDIAC CATHETERIZATION     8 years ago stents  . COLONOSCOPY N/A 02/16/2017   Procedure: COLONOSCOPY;  Surgeon: Jerene Bears, MD;  Location: [facility];  Service: Gastroenterology;  Laterality: N/A;  . CORONARY ARTERY BYPASS GRAFT N/A 07/19/2020   Procedure: CORONARY ARTERY BYPASS GRAFTING (CABG) TIMES THREE USING LEFT INTERNAL MAMMARY ARTERY AND RIGHT GREATER SAPHENOUS VEIN HARVESTED ENDOSCOPICALLY;  Surgeon: Gilford Raid  K, MD;  Location: [facility];  Service: Open Heart Surgery;  Laterality: N/A;  . INSERT / REPLACE / REMOVE PACEMAKER  06/20/2018  . LEFT HEART CATH AND CORONARY ANGIOGRAPHY N/A 11/05/2016   Procedure: Left Heart Cath and Coronary Angiography;  Surgeon: Nelva Bush, MD;  Location: [facility];  Service: Cardiovascular;  Laterality: N/A;  . LEFT HEART CATH AND CORONARY ANGIOGRAPHY N/A 07/12/2020   Procedure: LEFT HEART CATH AND CORONARY ANGIOGRAPHY;  Surgeon: Burnell Blanks, MD;  Location: [facility];  Service: Cardiovascular;  Laterality: N/A;  . LUMBAR  LAMINECTOMY/DECOMPRESSION MICRODISCECTOMY N/A 05/12/2018   Procedure: Microlumbar decompression L2-3, possible L1-2;  Surgeon: Susa Day, MD;  Location: [facility];  Service: Orthopedics;  Laterality: N/A;  2 hrs, Microlumbar decompression L2-3, possible L1-2  . PACEMAKER IMPLANT N/A 06/20/2018   Procedure: PACEMAKER IMPLANT;  Surgeon: Evans Lance, MD;  Location: [facility];  Service: Cardiovascular;  Laterality: N/A;  . PERCUTANEOUS CORONARY ROTOBLATOR INTERVENTION (PCI-R) N/A 06/30/2012   Procedure: PERCUTANEOUS CORONARY ROTOBLATOR INTERVENTION (PCI-R);  Surgeon: Burnell Blanks, MD;  Location: [facility];  Service: Cardiovascular;  Laterality: N/A;  . TEE WITHOUT CARDIOVERSION N/A 07/19/2020   Procedure: TRANSESOPHAGEAL ECHOCARDIOGRAM (TEE);  Surgeon: Gaye Pollack, MD;  Location: [facility];  Service: Open Heart Surgery;  Laterality: N/A;  . VIDEO BRONCHOSCOPY  08/13/2011   Procedure: VIDEO BRONCHOSCOPY WITHOUT FLUORO;  Surgeon: Tanda Rockers, MD;  Location: [facility];  Service: Cardiopulmonary;  Laterality: Bilateral;    Current Outpatient Medications  Medication Sig Dispense Refill  . acetaminophen (TYLENOL) 500 MG tablet Take 1,000 mg by mouth every 6 (six) hours as needed (pain).     Marland Kitchen albuterol (VENTOLIN HFA) 108 (90 Base) MCG/ACT inhaler INHALE 2 PUFFS INTO THE LUNGS EVERY 6 HOURS AS NEEDED FOR WHEEZE OR SHORTNESS OF BREATH (Patient taking differently: Inhale 2 puffs into the lungs every 6 (six) hours as needed for wheezing or shortness of breath.) 18 each 3  . aspirin EC 325 MG EC tablet Take 1 tablet (325 mg total) by mouth daily. 30 tablet 0  . Choline Fenofibrate (FENOFIBRIC ACID) 135 MG CPDR Take 1 tablet by mouth daily. 30 capsule 11  . famotidine (PEPCID) 20 MG tablet Take 20 mg by mouth daily as needed for heartburn.    . hydrochlorothiazide (HYDRODIURIL) 25 MG tablet TAKE 1/2 TABLET BY MOUTH EVERY DAY (Patient taking differently: Take 12.5 mg by mouth daily.) 15  tablet 11  . metFORMIN (GLUCOPHAGE) 500 MG tablet Take 1 tablet (500 mg total) by mouth 2 (two) times daily with a meal. 180 tablet 3  . metoprolol tartrate (LOPRESSOR) 25 MG tablet Take 1 tablet (25 mg total) by mouth 2 (two) times daily. Take addition one tablet 1 hr prior to exercise 225 tablet 1  . niacin 500 MG tablet Take 500 mg by mouth at bedtime.    . nitroGLYCERIN (NITROSTAT) 0.4 MG SL tablet Place 1 tablet (0.4 mg total) under the tongue every 5 (five) minutes as needed for chest pain. 25 tablet 6  . RABEprazole (ACIPHEX) 20 MG tablet Take 1 tablet (20 mg total) by mouth daily in the afternoon. 30 tablet 11  . ranolazine (RANEXA) 500 MG 12 hr tablet Take 1 tablet (500 mg total) by mouth 2 (two) times daily. 180 tablet 3  . rosuvastatin (CRESTOR) 20 MG tablet TAKE 1 TABLET BY MOUTH EVERY DAY (Patient taking differently: Take 20 mg by mouth daily.) 30 tablet 11  No current facility-administered medications for this visit.   Allergies:  Amlodipine besylate, Propofol, Lisinopril, Rosuvastatin, and Metoprolol   Social History: The patient  reports that he quit smoking about 8 years ago. His smoking use included cigarettes. He has a 20.00 pack-year smoking history. He has never used smokeless tobacco. He reports previous alcohol use. He reports that he does not use drugs.   Family History: The patient's family history includes Breast cancer in his sister; Diabetes in his mother; Heart disease in his father; Kidney failure in his mother.   ROS:  Please see the history of present illness. Otherwise, complete review of systems is positive for none.  All other systems are reviewed and negative.   Physical Exam: VS:  BP 124/82   Pulse 62   Ht 5\' 10"  (1.778 m)   Wt 182 lb (82.6 kg)   SpO2 99%   BMI 26.11 kg/m , BMI Body mass index is 26.11 kg/m.  Wt Readings from Last 3 Encounters:  08/08/20 182 lb (82.6 kg)  08/01/20 183 lb 9.6 oz (83.3 kg)  07/24/20 194 lb 4.8 oz (88.1 kg)     General: Patient appears comfortable at rest. Neck: Supple, no elevated JVP or carotid bruits, no thyromegaly. Lungs: Clear to auscultation, nonlabored breathing at rest. Cardiac: Regular rate and rhythm, no S3 or significant systolic murmur, no pericardial rub. Extremities: No pitting edema, distal pulses 2+. Skin: Warm and dry.  Median sternotomy site, chest tube insertion sites and drain sites look good.  Saphenous vein graft site looks good. Musculoskeletal: No kyphosis. Neuropsychiatric: Alert and oriented x3, affect grossly appropriate.  ECG:  EKG July 20, 2020: A sensed and ventricular paced rhythm with premature ventricular or aberrantly conducted complex plexus.  Rate of 83, AV pacing and later half of tracing.  Recent Labwork: 03/20/2020: TSH 3.96 07/19/2020: ALT 20; AST 33 07/20/2020: Magnesium 2.2 07/23/2020: BUN 22; Creatinine, Ser 1.23; Hemoglobin 10.4; Platelets 183; Potassium 4.3; Sodium 133     Component Value Date/Time   CHOL 146 07/05/2020 1424   TRIG 174 (H) 07/05/2020 1424   HDL 46 07/05/2020 1424   CHOLHDL 3.2 07/05/2020 1424   CHOLHDL 3.1 01/09/2016 1127   VLDL 26 01/09/2016 1127   LDLCALC 71 07/05/2020 1424   LDLDIRECT 107.0 02/27/2013 0904    Other Studies Reviewed Today:  CABG x 3 07/19/2020 Procedure:  1. Median Sternotomy 2. Extracorporeal circulation 3.   Coronary artery bypass grafting x 3   Left internal mammary artery graft to the LAD  SVG to OM3  SVG to PDA  4.   Endoscopic vein harvest from the right leg   Echocardiogram 07/18/2020  1. Left ventricular ejection fraction, by estimation, is 55 to 60%. The left ventricle has normal function. The left ventricle has no regional wall motion abnormalities. There is mild left ventricular hypertrophy. Left ventricular diastolic parameters are indeterminate. 2. Right ventricular systolic function is normal. The right ventricular size is normal. Mildly increased right ventricular wall  thickness. 3. The mitral valve is abnormal. Trivial mitral valve regurgitation. 4. The aortic valve is normal in structure. Aortic valve regurgitation is not visualized. 5. The inferior vena cava is normal in size with greater than 50% respiratory variability, suggesting right atrial pressure of 3 mmHg. Comparison(s): The left ventricular function is unchanged.    07/12/2020 LEFT HEART CATH AND CORONARY ANGIOGRAPHY    Conclusion    Ost 1st Diag lesion is 50% stenosed.  Prox LAD lesion is 65% stenosed.  Mid  LAD lesion is 70% stenosed.  Dist LAD-1 lesion is 70% stenosed.  Dist LAD-2 lesion is 70% stenosed.  Ost LAD lesion is 20% stenosed.  1st Mrg lesion is 90% stenosed.  2nd Mrg lesion is 90% stenosed.  3rd Mrg lesion is 90% stenosed.  Mid Cx lesion is 60% stenosed.  Mid RCA to Dist RCA lesion is 40% stenosed.  Ost RCA lesion is 40% stenosed.  Mid RCA lesion is 60% stenosed.  The left ventricular systolic function is normal.  LV end diastolic pressure is normal.  The left ventricular ejection fraction is 55-65% by visual estimate.  There is no mitral valve regurgitation.   Severe three vessel CAD The LAD has severe stenotic lesions in the mid and distal vessel. Diffuse disease throughout The Circumflex has a moderately severe mid stenosis. The first two OM branches are small with severe proximal disease. The larger caliber third obtuse marginal branch has severe stenosis.  The RCA is a large dominant vessel with patent stents from the proximal to the mid vessel. The long stented segment is patent with moderate restenosis.  Normal LV systolic function  Recommendations: He has progression of three vessel CAD since cath in 2018. The LAD lesions were flow limiting by FFR in 2018. He seems to be having accelerated angina despite medical therapy. I will ask Dr. Acie Fredrickson to add Ranexa. I think we can consider CABG. We can make an outpatient referral to CT surgery to  discuss options for CABG.   Diagnostic Dominance: Right      Assessment and Plan:  1. S/P CABG x 3   2. Coronary artery disease involving native coronary artery of native heart with angina pectoris ([place])   3. Essential hypertension   4. Mixed hyperlipidemia   5. Type 2 diabetes mellitus with complication, without long-term current use of insulin ([place])      1. S/P CABG x 3 Status post CABG x3 on 07/19/2020 with Dr. Cyndia Bent.  LIMA to LAD, SVG to OM 3, SVG to PDA, endoscopic vein harvest from right leg.  Median sternotomy site, chest tube insertion sites, drain sites all clean and dry.  SVG graft site looks good.  2. Coronary artery disease involving native coronary artery of native heart with angina pectoris [facility]) Recent cardiac catheterization demonstrating Severe three-vessel CAD.  LAD with severe stenotic lesions in mid and distal vessel.  Diffuse disease throughout.  Circumflex moderate to severe mid stenosis.  First 2 OM branches small with severe proximal disease.  Larger caliber third obtuse marginal severe stenosis.  RCA large dominant vessel with patent stents from proximal to mid vessel.  Long stented segment is patent with moderate restenosis.  Referred for CT surgery as noted above.  No current anginal or exertional symptoms.  States he is walking some on treadmill without symptoms. Continue aspirin 325 mg daily.  Not on Plavix due to previous hematoma.  Continue sublingual nitroglycerin as needed.  Ranexa 500 mg every 12 hours.  3. Essential hypertension Blood pressure well controlled today.  BP today is 124/82.  Continue HCTZ 12.5 mg daily.  Continue metoprolol 25 mg p.o. twice daily.  4. Mixed hyperlipidemia Continue Crestor 20 mg daily.  Lipid panel 07/05/2020: TC 146, HDL 46, TG 174, LDL 71  5. Type 2 diabetes mellitus with complication, without long-term current use of insulin ([place]) States he was recently started on metformin 500 mg p.o. twice daily beginning last  Thursday.  He stated from Thursday until earlier this week he started  having fluctuations in his heart rate and blood pressure.  He was concerned this Daniel have been partially related to starting metformin.  6.  Pacemaker Patient states he missed his last remote pacemaker check and is concerned his pacemaker Daniel be malfunctioning due to recent increase in heart rates.  He states his heart rate has been as high as 104 at rest recently.  Today heart rate appears well controlled with a rate of 62.  I advised patient I will try to get him an earlier in clinic appointment as he requests with Dr. Lovena Le  Medication Adjustments/Labs and Tests Ordered: Current medicines are reviewed at length with the patient today.  Concerns regarding medicines are outlined above.   Disposition: Follow-up with Dr. Acie Fredrickson or APP 3 months  Signed, Levell July, NP 08/08/2020 12:03 PM    [facility], [place], [place] [address] Phone: [phone]; Fax: [phone]

## 2020-08-08 ENCOUNTER — Other Ambulatory Visit: Payer: Self-pay

## 2020-08-08 ENCOUNTER — Encounter: Payer: Self-pay | Admitting: Family Medicine

## 2020-08-08 ENCOUNTER — Ambulatory Visit (INDEPENDENT_AMBULATORY_CARE_PROVIDER_SITE_OTHER): Payer: BC Managed Care – PPO | Admitting: Family Medicine

## 2020-08-08 VITALS — BP 124/82 | HR 62 | Ht 70.0 in | Wt 182.0 lb

## 2020-08-08 DIAGNOSIS — E782 Mixed hyperlipidemia: Secondary | ICD-10-CM | POA: Diagnosis not present

## 2020-08-08 DIAGNOSIS — Z951 Presence of aortocoronary bypass graft: Secondary | ICD-10-CM | POA: Diagnosis not present

## 2020-08-08 DIAGNOSIS — I25119 Atherosclerotic heart disease of native coronary artery with unspecified angina pectoris: Secondary | ICD-10-CM

## 2020-08-08 DIAGNOSIS — I10 Essential (primary) hypertension: Secondary | ICD-10-CM | POA: Diagnosis not present

## 2020-08-08 DIAGNOSIS — E118 Type 2 diabetes mellitus with unspecified complications: Secondary | ICD-10-CM

## 2020-08-08 NOTE — Patient Instructions (Addendum)
Medication Instructions:  Continue all current medications.  Labwork: none  Testing/Procedures: none  Follow-Up: 3 months   Any Other Special Instructions Will Be Listed Below (If Applicable).  If you need a refill on your cardiac medications before your next appointment, please call your pharmacy.  

## 2020-08-11 ENCOUNTER — Other Ambulatory Visit: Payer: Self-pay | Admitting: Physician Assistant

## 2020-08-11 ENCOUNTER — Other Ambulatory Visit (INDEPENDENT_AMBULATORY_CARE_PROVIDER_SITE_OTHER): Payer: Self-pay | Admitting: Otolaryngology

## 2020-08-20 ENCOUNTER — Other Ambulatory Visit: Payer: Self-pay | Admitting: Surgery

## 2020-08-20 ENCOUNTER — Ambulatory Visit (INDEPENDENT_AMBULATORY_CARE_PROVIDER_SITE_OTHER): Payer: BC Managed Care – PPO | Admitting: Internal Medicine

## 2020-08-20 ENCOUNTER — Other Ambulatory Visit: Payer: Self-pay

## 2020-08-20 VITALS — BP 94/70 | HR 81 | Ht 70.0 in | Wt 178.8 lb

## 2020-08-20 DIAGNOSIS — I25119 Atherosclerotic heart disease of native coronary artery with unspecified angina pectoris: Secondary | ICD-10-CM | POA: Diagnosis not present

## 2020-08-20 DIAGNOSIS — Z951 Presence of aortocoronary bypass graft: Secondary | ICD-10-CM

## 2020-08-20 DIAGNOSIS — I441 Atrioventricular block, second degree: Secondary | ICD-10-CM

## 2020-08-20 MED ORDER — HYDROCHLOROTHIAZIDE 25 MG PO TABS: 12.5000 mg | ORAL_TABLET | Freq: Every day | ORAL | 3 refills | Status: DC | PRN

## 2020-08-20 MED ORDER — METOPROLOL TARTRATE 25 MG PO TABS: 12.5000 mg | ORAL_TABLET | Freq: Two times a day (BID) | ORAL | 3 refills | Status: DC

## 2020-08-20 NOTE — Patient Instructions (Signed)
Medication Instructions:  Your physician has recommended you make the following change in your medication:   Take HCTZ as needed  Take Lopressor 12.5 mg (1/2 Tablet) Two Times Daily   *If you need a refill on your cardiac medications before your next appointment, please call your pharmacy*   Lab Work: NONE   If you have labs (blood work) drawn today and your tests are completely normal, you will receive your results only by: Marland Kitchen MyChart Message (if you have MyChart) OR . A paper copy in the mail If you have any lab test that is abnormal or we need to change your treatment, we will call you to review the results.   Testing/Procedures: NONE    Follow-Up: At [facility], you and your health needs are our priority.  As part of our continuing mission to provide you with exceptional heart care, we have created designated Provider Care Teams.  These Care Teams include your primary Cardiologist (physician) and Advanced Practice Providers (APPs -  Physician Assistants and Nurse Practitioners) who all work together to provide you with the care you need, when you need it.  We recommend signing up for the patient portal called "MyChart".  Sign up information is provided on this After Visit Summary.  MyChart is used to connect with patients for Virtual Visits (Telemedicine).  Patients are able to view lab/test results, encounter notes, upcoming appointments, etc.  Non-urgent messages can be sent to your provider as well.   To learn more about what you can do with MyChart, go to [email or web address].    Your next appointment:   1 year(s)  The format for your next appointment:   In Person  Provider:   [name], MD   Other Instructions Thank you for choosing [facility]!

## 2020-08-20 NOTE — Progress Notes (Signed)
HPI Mr. Riedl returns today for followup of CHB, s/p PPM insertion, HTN, and CAD. He has had mild exertional chest pressure. He has undergone CABG in the past few months. He notes that his bp is low. He denies chest pain though he still has incisional pain.  Allergies  Allergen Reactions  . Amlodipine Besylate Other (See Comments)    Headache, weakness, dizziness  . Propofol Other (See Comments)    Heart rate dropped    . Lisinopril Cough and Rash  . Rosuvastatin Rash and Other (See Comments)     muscle aches   . Metoprolol     Lowered heart rate too much      Current Outpatient Medications  Medication Sig Dispense Refill  . acetaminophen (TYLENOL) 500 MG tablet Take 1,000 mg by mouth every 6 (six) hours as needed (pain).     Marland Kitchen albuterol (VENTOLIN HFA) 108 (90 Base) MCG/ACT inhaler INHALE 2 PUFFS INTO THE LUNGS EVERY 6 HOURS AS NEEDED FOR WHEEZE OR SHORTNESS OF BREATH 18 each 3  . aspirin EC 325 MG EC tablet Take 1 tablet (325 mg total) by mouth daily. 30 tablet 0  . Choline Fenofibrate (FENOFIBRIC ACID) 135 MG CPDR Take 1 tablet by mouth daily. 30 capsule 11  . famotidine (PEPCID) 20 MG tablet Take 20 mg by mouth daily as needed for heartburn.    . hydrochlorothiazide (HYDRODIURIL) 25 MG tablet TAKE 1/2 TABLET BY MOUTH EVERY DAY 15 tablet 11  . metFORMIN (GLUCOPHAGE) 500 MG tablet Take 1 tablet (500 mg total) by mouth 2 (two) times daily with a meal. 180 tablet 3  . metoprolol tartrate (LOPRESSOR) 25 MG tablet Take 1 tablet (25 mg total) by mouth 2 (two) times daily. Take addition one tablet 1 hr prior to exercise 225 tablet 1  . niacin 500 MG tablet Take 500 mg by mouth at bedtime.    . nitroGLYCERIN (NITROSTAT) 0.4 MG SL tablet Place 1 tablet (0.4 mg total) under the tongue every 5 (five) minutes as needed for chest pain. 25 tablet 6  . RABEprazole (ACIPHEX) 20 MG tablet Take 1 tablet (20 mg total) by mouth daily in the afternoon. 30 tablet 11  . ranolazine (RANEXA) 500  MG 12 hr tablet Take 1 tablet (500 mg total) by mouth 2 (two) times daily. 180 tablet 3  . rosuvastatin (CRESTOR) 20 MG tablet TAKE 1 TABLET BY MOUTH EVERY DAY 30 tablet 11   No current facility-administered medications for this visit.     Past Medical History:  Diagnosis Date  . Arthritis    left wrist  . 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 CAD (coronary artery disease)    s/p atherectomy, PTCA/DES x2 mid & distal RCA 06/30/12  . Chest pain   . Complication of anesthesia    heart rate dropping with propofol last procedure  . COPD (chronic obstructive pulmonary disease) ([place])   . Dyspnea   . ED (erectile dysfunction)   . Esophageal stricture   . External hemorrhoids without mention of complication   . GERD (gastroesophageal reflux disease)   . Headache(784.0)   . Hiatal hernia   . History of kidney stones   . HLD (hyperlipidemia)   . HTN (hypertension)   . Myocardial infarction ([place])    1996  . Personal history of colonic polyps 03/12/2010   TUBULAR ADENOMA  . Presence of permanent cardiac pacemaker   . Tobacco abuse   .  Tubular adenoma of colon   . Type 2 diabetes mellitus (HCC)     ROS:   All systems reviewed and negative except as noted in the HPI.   Past Surgical History:  Procedure Laterality Date  . ANGIOPLASTY     X3  . BACK SURGERY    . CARDIAC CATHETERIZATION     8 years ago stents  . COLONOSCOPY N/A 02/16/2017   Procedure: COLONOSCOPY;  Surgeon: Jerene Bears, MD;  Location: [facility];  Service: Gastroenterology;  Laterality: N/A;  . CORONARY ARTERY BYPASS GRAFT N/A 07/19/2020   Procedure: CORONARY ARTERY BYPASS GRAFTING (CABG) TIMES THREE USING LEFT INTERNAL MAMMARY ARTERY AND RIGHT GREATER SAPHENOUS VEIN HARVESTED ENDOSCOPICALLY;  Surgeon: Gaye Pollack, MD;  Location: [facility];  Service: Open Heart Surgery;  Laterality: N/A;  . INSERT / REPLACE / REMOVE PACEMAKER  06/20/2018  . LEFT HEART CATH AND CORONARY ANGIOGRAPHY N/A 11/05/2016    Procedure: Left Heart Cath and Coronary Angiography;  Surgeon: Nelva Bush, MD;  Location: [place] [facility];  Service: Cardiovascular;  Laterality: N/A;  . LEFT HEART CATH AND CORONARY ANGIOGRAPHY N/A 07/12/2020   Procedure: LEFT HEART CATH AND CORONARY ANGIOGRAPHY;  Surgeon: Burnell Blanks, MD;  Location: [place] [facility];  Service: Cardiovascular;  Laterality: N/A;  . LUMBAR LAMINECTOMY/DECOMPRESSION MICRODISCECTOMY N/A 05/12/2018   Procedure: Microlumbar decompression L2-3, possible L1-2;  Surgeon: Susa Day, MD;  Location: [place] [facility];  Service: Orthopedics;  Laterality: N/A;  2 hrs, Microlumbar decompression L2-3, possible L1-2  . PACEMAKER IMPLANT N/A 06/20/2018   Procedure: PACEMAKER IMPLANT;  Surgeon: Evans Lance, MD;  Location: [place] [facility];  Service: Cardiovascular;  Laterality: N/A;  . PERCUTANEOUS CORONARY ROTOBLATOR INTERVENTION (PCI-R) N/A 06/30/2012   Procedure: PERCUTANEOUS CORONARY ROTOBLATOR INTERVENTION (PCI-R);  Surgeon: Burnell Blanks, MD;  Location[facility];  Service: Cardiovascular;  Laterality: N/A;  . TEE WITHOUT CARDIOVERSION N/A 07/19/2020   Procedure: TRANSESOPHAGEAL ECHOCARDIOGRAM (TEE);  Surgeon: Gaye Pollack, MD;  Location: [place];  Service: Open Heart Surgery;  Laterality: N/A;  . VIDEO BRONCHOSCOPY  08/13/2011   Procedure: VIDEO BRONCHOSCOPY WITHOUT FLUORO;  Surgeon: Tanda Rockers, MD;  Location: [facility];  Service: Cardiopulmonary;  Laterality: Bilateral;     Family History  Problem Relation Age of Onset  . Heart disease Father        hx of MI  . Diabetes Mother        and brother   . Kidney failure Mother   . Breast cancer Sister   . Colon cancer Neg Hx   . Stomach cancer Neg Hx      Social History   Socioeconomic History  . Marital status: Married    Spouse name: Not on file  . Number of children: 1  . Years of education: Not on file  . Highest education level: Not on file  Occupational History  .  Occupation: Radio broadcast assistant  Tobacco Use  . Smoking status: Former Smoker    Packs/day: 0.50    Years: 40.00    Pack years: 20.00    Types: Cigarettes    Quit date: 08/25/2011    Years since quitting: 8.9  . Smokeless tobacco: Never Used  Vaping Use  . Vaping Use: Never used  Substance and Sexual Activity  . Alcohol use: Not Currently  . Drug use: No  . Sexual activity: Not Currently  Other Topics Concern  . Not on file  Social History Narrative  .  Not on file   Social Determinants of Health   Financial Resource Strain: Not on file  Food Insecurity: Not on file  Transportation Needs: Not on file  Physical Activity: Not on file  Stress: Not on file  Social Connections: Not on file  Intimate Partner Violence: Not on file     BP 94/70   Pulse 81   Ht 5\' 10"  (1.778 m)   Wt 178 lb 12.8 oz (81.1 kg)   SpO2 99%   BMI 25.66 kg/m   Physical Exam:  Well appearing NAD HEENT: Unremarkable Neck:  No JVD, no thyromegally Lymphatics:  No adenopathy Back:  No CVA tenderness Lungs:  Clear with no wheezes HEART:  Regular rate rhythm, no murmurs, no rubs, no clicks Abd:  soft, positive bowel sounds, no organomegally, no rebound, no guarding Ext:  2 plus pulses, no edema, no cyanosis, no clubbing Skin:  No rashes no nodules Neuro:  CN II through XII intact, motor grossly intact   DEVICE  Normal device function.  See PaceArt for details.   Assess/Plan: 1. PAF - he has had post op atrial fib but none since. We will follow 2. CHB - he is s/p PPM and pacing 99% in the ventricle. 3. PPM -his st jude DDD PPM is working normally. 4. CAD - he has done well s/p cabg.  5. Dyslipidemia - he will continue statin therapy.  Carleene Overlie Kenan Moodie,MD

## 2020-08-21 ENCOUNTER — Ambulatory Visit (INDEPENDENT_AMBULATORY_CARE_PROVIDER_SITE_OTHER): Payer: Self-pay | Admitting: Surgery

## 2020-08-21 ENCOUNTER — Ambulatory Visit
Admission: RE | Admit: 2020-08-21 | Discharge: 2020-08-21 | Disposition: A | Discharge status: Home / Self Care | Payer: BC Managed Care – PPO | Source: Ambulatory Visit | Attending: Surgery | Admitting: Surgery

## 2020-08-21 ENCOUNTER — Encounter: Payer: Self-pay | Admitting: Surgery

## 2020-08-21 VITALS — BP 117/75 | HR 80 | Resp 20 | Ht 70.0 in | Wt 179.0 lb

## 2020-08-21 DIAGNOSIS — Z951 Presence of aortocoronary bypass graft: Secondary | ICD-10-CM

## 2020-08-21 DIAGNOSIS — I499 Cardiac arrhythmia, unspecified: Secondary | ICD-10-CM | POA: Diagnosis not present

## 2020-08-21 DIAGNOSIS — Z950 Presence of cardiac pacemaker: Secondary | ICD-10-CM | POA: Diagnosis not present

## 2020-08-22 ENCOUNTER — Encounter: Payer: BC Managed Care – PPO | Admitting: Family Medicine

## 2020-08-24 NOTE — Progress Notes (Signed)
HPI: Patient returns for routine postoperative follow-up having undergone coronary artery bypass graft surgery on 07/19/2020. The patient's early postoperative recovery while in the hospital was notable for an uncomplicated postoperative course. Since hospital discharge the patient reports that he has been feeling well.  He has been walking without chest pain or shortness of breath..   Current Outpatient Medications  Medication Sig Dispense Refill  . acetaminophen (TYLENOL) 500 MG tablet Take 1,000 mg by mouth every 6 (six) hours as needed (pain).     Marland Kitchen albuterol (VENTOLIN HFA) 108 (90 Base) MCG/ACT inhaler INHALE 2 PUFFS INTO THE LUNGS EVERY 6 HOURS AS NEEDED FOR WHEEZE OR SHORTNESS OF BREATH 18 each 3  . aspirin EC 325 MG EC tablet Take 1 tablet (325 mg total) by mouth daily. 30 tablet 0  . Choline Fenofibrate (FENOFIBRIC ACID) 135 MG CPDR Take 1 tablet by mouth daily. 30 capsule 11  . famotidine (PEPCID) 20 MG tablet Take 20 mg by mouth daily as needed for heartburn.    . metFORMIN (GLUCOPHAGE) 500 MG tablet Take 1 tablet (500 mg total) by mouth 2 (two) times daily with a meal. 180 tablet 3  . metoprolol tartrate (LOPRESSOR) 25 MG tablet Take 0.5 tablets (12.5 mg total) by mouth 2 (two) times daily. 90 tablet 3  . niacin 500 MG tablet Take 500 mg by mouth at bedtime.    . nitroGLYCERIN (NITROSTAT) 0.4 MG SL tablet Place 1 tablet (0.4 mg total) under the tongue every 5 (five) minutes as needed for chest pain. 25 tablet 6  . RABEprazole (ACIPHEX) 20 MG tablet Take 1 tablet (20 mg total) by mouth daily in the afternoon. 30 tablet 11  . rosuvastatin (CRESTOR) 20 MG tablet TAKE 1 TABLET BY MOUTH EVERY DAY 30 tablet 11  . hydrochlorothiazide (HYDRODIURIL) 25 MG tablet Take 0.5 tablets (12.5 mg total) by mouth daily as needed. (Patient not taking: Reported on 08/21/2020) 90 tablet 3   No current facility-administered medications for this visit.    Physical Exam: BP 117/75 (BP Location: Left  Arm, Patient Position: Sitting)   Pulse 80   Resp 20   Ht 5\' 10"  (1.778 m)   Wt 179 lb (81.2 kg)   SpO2 99% Comment: RA  BMI 25.68 kg/m  He looks well. Cardiac exam shows a regular rate and rhythm with normal heart sounds. Lung exam is clear. Chest incision is healing well and the sternum is stable. The leg incision is healing well and there is no peripheral edema.   Diagnostic Tests:  CLINICAL DATA:  Status post coronary artery bypass grafting and cardiac arrhythmia with pacemaker placement  EXAM: CHEST - 2 VIEW  COMPARISON:  July 23, 2020  FINDINGS: Lungs are clear. Heart size and pulmonary vascularity are normal. Pacemaker leads attached to right heart, unchanged. Status post coronary artery bypass grafting. No adenopathy. No bone lesions.  IMPRESSION: Lungs clear. Heart size normal. Stable pacemaker lead positioning. Status post coronary artery bypass grafting.   Electronically Signed   By: Lowella Grip III M.D.   On: 08/21/2020 12:11  Impression:  Overall he is making good progress following his coronary bypass surgery.  I encouraged him to continue walking as much possible.  I told him he can return to driving a car at this time but should refrain from lifting anything heavier than 10 pounds for 3 months postoperatively.  Plan:  He will continue to follow-up with his PCP and cardiology and will return to see me  if he has any problems with his incisions.   Gaye Pollack, MD [facility] [phone]

## 2020-09-03 ENCOUNTER — Encounter: Payer: Self-pay | Admitting: Vascular Surgery

## 2020-09-03 ENCOUNTER — Ambulatory Visit (INDEPENDENT_AMBULATORY_CARE_PROVIDER_SITE_OTHER): Payer: BC Managed Care – PPO | Admitting: Vascular Surgery

## 2020-09-03 ENCOUNTER — Other Ambulatory Visit: Payer: Self-pay

## 2020-09-03 VITALS — BP 103/67 | HR 75 | Temp 97.7°F | Resp 20 | Ht 70.0 in | Wt 180.0 lb

## 2020-09-03 DIAGNOSIS — I6523 Occlusion and stenosis of bilateral carotid arteries: Secondary | ICD-10-CM

## 2020-09-03 NOTE — Progress Notes (Signed)
Patient ID: Daniel Carey, male   DOB: 09-Jan-1951, 70 y.o.   MRN: [id number]  Reason for Consult: New Patient (Initial Visit) and Carotid   Referred by Vivi Barrack, MD  Subjective:     HPI:  Daniel Carey is a 70 y.o. male history of coronary artery disease status post stenting and subsequent coronary artery bypass grafting.  During work-up for coronary artery bypass he was found to have bilateral carotid artery stenosis.  He denies any history of stroke, TIA or amaurosis.  He denies history of personal or family history of aneurysm disease.  He has a former smoker quit many years ago.  He has strong family history of coronary disease.  Other risk factors include hyperlipidemia hypertension and borderline diabetes.  He is not on any blood thinners.  He does take aspirin and Crestor.  Past Medical History:  Diagnosis Date  . Arthritis    left wrist  . 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 CAD (coronary artery disease)    s/p atherectomy, PTCA/DES x2 mid & distal RCA 06/30/12  . Carotid artery occlusion   . Chest pain   . Complication of anesthesia    heart rate dropping with propofol last procedure  . COPD (chronic obstructive pulmonary disease) ([place])   . Dyspnea   . ED (erectile dysfunction)   . Esophageal stricture   . External hemorrhoids without mention of complication   . GERD (gastroesophageal reflux disease)   . Headache(784.0)   . Hiatal hernia   . History of kidney stones   . HLD (hyperlipidemia)   . HTN (hypertension)   . Myocardial infarction ([place])    1996  . Personal history of colonic polyps 03/12/2010   TUBULAR ADENOMA  . Presence of permanent cardiac pacemaker   . Tobacco abuse   . Tubular adenoma of colon   . Type 2 diabetes mellitus (HCC)    Family History  Problem Relation Age of Onset  . Heart disease Father        hx of MI  . Diabetes Mother        and brother   . Kidney failure Mother   . Breast cancer Sister   . Colon cancer  Neg Hx   . Stomach cancer Neg Hx    Past Surgical History:  Procedure Laterality Date  . ANGIOPLASTY     X3  . BACK SURGERY    . CARDIAC CATHETERIZATION     8 years ago stents  . COLONOSCOPY N/A 02/16/2017   Procedure: COLONOSCOPY;  Surgeon: Jerene Bears, MD;  Location: [facility];  Service: Gastroenterology;  Laterality: N/A;  . CORONARY ARTERY BYPASS GRAFT N/A 07/19/2020   Procedure: CORONARY ARTERY BYPASS GRAFTING (CABG) TIMES THREE USING LEFT INTERNAL MAMMARY ARTERY AND RIGHT GREATER SAPHENOUS VEIN HARVESTED ENDOSCOPICALLY;  Surgeon: Gaye Pollack, MD;  Location: [facility];  Service: Open Heart Surgery;  Laterality: N/A;  . INSERT / REPLACE / REMOVE PACEMAKER  06/20/2018  . LEFT HEART CATH AND CORONARY ANGIOGRAPHY N/A 11/05/2016   Procedure: Left Heart Cath and Coronary Angiography;  Surgeon: Nelva Bush, MD;  Location: [place] [facility];  Service: Cardiovascular;  Laterality: N/A;  . LEFT HEART CATH AND CORONARY ANGIOGRAPHY N/A 07/12/2020   Procedure: LEFT HEART CATH AND CORONARY ANGIOGRAPHY;  Surgeon: Burnell Blanks, MD;  Location: [place] [facility];  Service: Cardiovascular;  Laterality: N/A;  . LUMBAR LAMINECTOMY/DECOMPRESSION MICRODISCECTOMY N/A 05/12/2018  Procedure: Microlumbar decompression L2-3, possible L1-2;  Surgeon: Susa Day, MD;  Location: [place];  Service: Orthopedics;  Laterality: N/A;  2 hrs, Microlumbar decompression L2-3, possible L1-2  . PACEMAKER IMPLANT N/A 06/20/2018   Procedure: PACEMAKER IMPLANT;  Surgeon: Evans Lance, MD;  Location: [place] [facility];  Service: Cardiovascular;  Laterality: N/A;  . PERCUTANEOUS CORONARY ROTOBLATOR INTERVENTION (PCI-R) N/A 06/30/2012   Procedure: PERCUTANEOUS CORONARY ROTOBLATOR INTERVENTION (PCI-R);  Surgeon: Burnell Blanks, MD;  Location: [facility] CATH LAB;  Service: Cardiovascular;  Laterality: N/A;  . TEE WITHOUT CARDIOVERSION N/A 07/19/2020   Procedure: TRANSESOPHAGEAL ECHOCARDIOGRAM (TEE);  Surgeon:  Gaye Pollack, MD;  Location: [place];  Service: Open Heart Surgery;  Laterality: N/A;  . VIDEO BRONCHOSCOPY  08/13/2011   Procedure: VIDEO BRONCHOSCOPY WITHOUT FLUORO;  Surgeon: Tanda Rockers, MD;  Location: [facility];  Service: Cardiopulmonary;  Laterality: Bilateral;    Short Social History:  Social History   Tobacco Use  . Smoking status: Former Smoker    Packs/day: 0.50    Years: 40.00    Pack years: 20.00    Types: Cigarettes    Quit date: 08/25/2011    Years since quitting: 9.0  . Smokeless tobacco: Never Used  Substance Use Topics  . Alcohol use: Not Currently    Allergies  Allergen Reactions  . Amlodipine Besylate Other (See Comments)    Headache, weakness, dizziness  . Propofol Other (See Comments)    Heart rate dropped    . Lisinopril Cough and Rash  . Rosuvastatin Rash and Other (See Comments)     muscle aches   . Metoprolol     Lowered heart rate too much     Current Outpatient Medications  Medication Sig Dispense Refill  . acetaminophen (TYLENOL) 500 MG tablet Take 1,000 mg by mouth every 6 (six) hours as needed (pain).     Marland Kitchen albuterol (VENTOLIN HFA) 108 (90 Base) MCG/ACT inhaler INHALE 2 PUFFS INTO THE LUNGS EVERY 6 HOURS AS NEEDED FOR WHEEZE OR SHORTNESS OF BREATH 18 each 3  . aspirin EC 325 MG EC tablet Take 1 tablet (325 mg total) by mouth daily. 30 tablet 0  . Choline Fenofibrate (FENOFIBRIC ACID) 135 MG CPDR Take 1 tablet by mouth daily. 30 capsule 11  . famotidine (PEPCID) 20 MG tablet Take 20 mg by mouth daily as needed for heartburn.    . metFORMIN (GLUCOPHAGE) 500 MG tablet Take 1 tablet (500 mg total) by mouth 2 (two) times daily with a meal. 180 tablet 3  . metoprolol tartrate (LOPRESSOR) 25 MG tablet Take 0.5 tablets (12.5 mg total) by mouth 2 (two) times daily. 90 tablet 3  . niacin 500 MG tablet Take 500 mg by mouth at bedtime.    . nitroGLYCERIN (NITROSTAT) 0.4 MG SL tablet Place 1 tablet (0.4 mg total) under the tongue every 5 (five)  minutes as needed for chest pain. 25 tablet 6  . RABEprazole (ACIPHEX) 20 MG tablet Take 1 tablet (20 mg total) by mouth daily in the afternoon. 30 tablet 11  . rosuvastatin (CRESTOR) 20 MG tablet TAKE 1 TABLET BY MOUTH EVERY DAY 30 tablet 11  . hydrochlorothiazide (HYDRODIURIL) 25 MG tablet Take 0.5 tablets (12.5 mg total) by mouth daily as needed. (Patient not taking: No sig reported) 90 tablet 3   No current facility-administered medications for this visit.    Review of Systems  Constitutional:  Constitutional negative. HENT: HENT negative.  Eyes: Eyes negative.  Respiratory: Respiratory negative.  Cardiovascular: Cardiovascular negative.  GI: Gastrointestinal negative.  Musculoskeletal: Musculoskeletal negative.  Skin: Skin negative.  Neurological: Neurological negative. Hematologic: Hematologic/lymphatic negative.  Psychiatric: Psychiatric negative.        Objective:  Objective   Vitals:   09/03/20 1433 09/03/20 1436  BP: 107/68 103/67  Pulse: 75   Resp: 20   Temp: 97.7 F (36.5 C)   SpO2: 98%   Weight: 180 lb (81.6 kg)   Height: 5\' 10"  (1.778 m)    Body mass index is 25.83 kg/m.  Physical Exam HENT:     Head: Normocephalic.     Nose:     Comments: Wearing a mask Eyes:     Pupils: Pupils are equal, round, and reactive to light.  Neck:     Vascular: No carotid bruit.  Cardiovascular:     Rate and Rhythm: Normal rate.     Pulses: Normal pulses.  Pulmonary:     Effort: Pulmonary effort is normal.     Breath sounds: Normal breath sounds.  Abdominal:     General: Abdomen is flat.     Palpations: Abdomen is soft. There is no mass.  Musculoskeletal:        General: No swelling. Normal range of motion.  Skin:    General: Skin is warm and dry.     Capillary Refill: Capillary refill takes less than 2 seconds.  Neurological:     General: No focal deficit present.     Mental Status: He is alert.  Psychiatric:        Mood and Affect: Mood normal.         Behavior: Behavior normal.        Thought Content: Thought content normal.        Judgment: Judgment normal.     Data: Right Carotid Findings:  +----------+--------+--------+--------+--------+---------+       PSV cm/sEDV cm/sStenosisDescribeComments   +----------+--------+--------+--------+--------+---------+  CCA Prox 123   23                  +----------+--------+--------+--------+--------+---------+  CCA Distal100   23                  +----------+--------+--------+--------+--------+---------+  ICA Prox 147   47   40-59% calcificRatio 1.4  +----------+--------+--------+--------+--------+---------+  ICA Distal55   17                  +----------+--------+--------+--------+--------+---------+  ECA    117   16                  +----------+--------+--------+--------+--------+---------+   Portions of this table do not appear on this page.          +----------+--------+-------+--------+------------+       PSV cm/sEDV cmsDescribeArm Pressure  +----------+--------+-------+--------+------------+  Subclavian160                   +----------+--------+-------+--------+------------+   +---------+--------+--+--------+--+  VertebralPSV cm/s51EDV cm/s14  +---------+--------+--+--------+--+   Left Carotid Findings:  +----------+--------+--------+--------+----------------+-------------------  ----+       PSV cm/sEDV cm/sStenosisDescribe    Comments          +----------+--------+--------+--------+----------------+-------------------  ----+  CCA Prox 127   23                             +----------+--------+--------+--------+----------------+-------------------  ----+  CCA [id number]   114   >50%  calcific and  Hemodynamically  irregular    significant  stenosis                             noted extending  from                             Distal CCA thru                                proximal internal.     +----------+--------+--------+--------+----------------+-------------------  ----+  ICA Prox 334   89   40-59% calcific and  Ratio not reliable  due                    irregular    to stenosis in  distal                            CCA with elevated                               velocity.          +----------+--------+--------+--------+----------------+-------------------  ----+  ICA Distal118   36                             +----------+--------+--------+--------+----------------+-------------------  ----+  ECA    90   14                             +----------+--------+--------+--------+----------------+-------------------  ----+    +----------+--------+--------+--------+------------+  SubclavianPSV cm/sEDV cm/sDescribeArm Pressure  +----------+--------+--------+--------+------------+       155                   +----------+--------+--------+--------+------------+   +---------+--------+--+--------+--+  VertebralPSV cm/s56EDV cm/s20  +---------+--------+--+--------+--+     ABI Findings:  +--------+------------------+-----+---------+--------+  Right  Rt Pressure (mmHg)IndexWaveform Comment   +--------+------------------+-----+---------+--------+  [id number]           triphasic      +--------+------------------+-----+---------+--------+  PTA   156        1.16 biphasic        +--------+------------------+-----+---------+--------+  DP   114        0.84 biphasic       +--------+------------------+-----+---------+--------+   +--------+------------------+-----+---------+-------+  Left  Lt Pressure (mmHg)IndexWaveform Comment  +--------+------------------+-----+---------+-------+  [id number]           triphasic      +--------+------------------+-----+---------+-------+  PTA   136        1.01 biphasic       +--------+------------------+-----+---------+-------+  DP   133        0.99 biphasic       +--------+------------------+-----+---------+-------+          Assessment/Plan:     70 year old male found to have bilateral carotid artery stenosis.  On the left side there is disease extending into the left common carotid artery.  He is asymptomatic from both of his carotid arteries.  He will continue aspirin and statin.  He will follow-up in 1 year with repeat carotid duplex unless he has issues prior.       Waynetta Sandy MD [facility]

## 2020-09-14 ENCOUNTER — Other Ambulatory Visit: Payer: Self-pay | Admitting: Cardiovascular Disease

## 2020-09-18 ENCOUNTER — Ambulatory Visit: Payer: BC Managed Care – PPO | Admitting: Internal Medicine

## 2020-09-19 ENCOUNTER — Other Ambulatory Visit (INDEPENDENT_AMBULATORY_CARE_PROVIDER_SITE_OTHER): Payer: Self-pay | Admitting: Otolaryngology

## 2020-09-19 ENCOUNTER — Other Ambulatory Visit: Payer: Self-pay | Admitting: Cardiovascular Disease

## 2020-09-19 ENCOUNTER — Other Ambulatory Visit: Payer: Self-pay | Admitting: Family Medicine

## 2020-09-20 NOTE — Telephone Encounter (Signed)
The original prescription was discontinued on 07/05/2020 by Georgiann Cocker, CMA for the following reason: Patient Preference. Renewing this prescription may not be appropriate. Please advise

## 2020-09-23 ENCOUNTER — Ambulatory Visit (INDEPENDENT_AMBULATORY_CARE_PROVIDER_SITE_OTHER): Payer: BC Managed Care – PPO

## 2020-09-23 DIAGNOSIS — I459 Conduction disorder, unspecified: Secondary | ICD-10-CM

## 2020-09-24 ENCOUNTER — Telehealth (INDEPENDENT_AMBULATORY_CARE_PROVIDER_SITE_OTHER): Payer: Self-pay

## 2020-09-24 ENCOUNTER — Other Ambulatory Visit (INDEPENDENT_AMBULATORY_CARE_PROVIDER_SITE_OTHER): Payer: Self-pay

## 2020-09-24 LAB — CUP PACEART REMOTE DEVICE CHECK
Battery Remaining Longevity: 109 mo
Battery Remaining Percentage: 95.5 %
Battery Voltage: 3.01 V
Brady Statistic AP VP Percent: 1 %
Brady Statistic AP VS Percent: 1 %
Brady Statistic AS VP Percent: 99 %
Brady Statistic AS VS Percent: 1 %
Brady Statistic RA Percent Paced: 1 %
Brady Statistic RV Percent Paced: 99 %
Date Time Interrogation Session: 20220607020015
Implantable Lead Implant Date: 20200302
Implantable Lead Implant Date: 20200302
Implantable Lead Location: 753859
Implantable Lead Location: 753860
Implantable Lead Model: 3830
Implantable Pulse Generator Implant Date: 20200302
Lead Channel Impedance Value: 460 Ohm
Lead Channel Impedance Value: 510 Ohm
Lead Channel Pacing Threshold Amplitude: 0.75 V
Lead Channel Pacing Threshold Amplitude: 0.75 V
Lead Channel Pacing Threshold Pulse Width: 0.5 ms
Lead Channel Pacing Threshold Pulse Width: 0.5 ms
Lead Channel Sensing Intrinsic Amplitude: 12 mV
Lead Channel Sensing Intrinsic Amplitude: 3.5 mV
Lead Channel Setting Pacing Amplitude: 2 V
Lead Channel Setting Pacing Amplitude: 2.5 V
Lead Channel Setting Pacing Pulse Width: 0.5 ms
Lead Channel Setting Sensing Sensitivity: 4 mV
Pulse Gen Model: 2272
Pulse Gen Serial Number: 9114136

## 2020-09-24 MED ORDER — IPRATROPIUM BROMIDE 0.06 % NA SOLN: 2.0000 | Freq: Four times a day (QID) | NASAL | 5 refills | Status: DC

## 2020-09-26 ENCOUNTER — Telehealth: Payer: Self-pay | Admitting: Cardiovascular Disease

## 2020-09-26 ENCOUNTER — Encounter (HOSPITAL_COMMUNITY): Payer: Self-pay

## 2020-09-26 ENCOUNTER — Encounter (HOSPITAL_COMMUNITY)
Admission: RE | Admit: 2020-09-26 | Discharge: 2020-09-26 | Disposition: A | Discharge status: Home / Self Care | Payer: BC Managed Care – PPO | Source: Ambulatory Visit | Attending: Cardiovascular Disease | Admitting: Cardiovascular Disease

## 2020-09-26 ENCOUNTER — Other Ambulatory Visit: Payer: Self-pay

## 2020-09-26 VITALS — BP 108/50 | HR 69 | Ht 70.0 in | Wt 183.9 lb

## 2020-09-26 DIAGNOSIS — Z951 Presence of aortocoronary bypass graft: Secondary | ICD-10-CM | POA: Diagnosis not present

## 2020-09-26 LAB — GLUCOSE, CAPILLARY: Glucose-Capillary: 92 mg/dL (ref 70–99)

## 2020-09-26 NOTE — Telephone Encounter (Signed)
Daniel Carey is calling stating Daniel Carey is requesting a note clearing him to complete cardiac rehab before returning to work. Please advise.

## 2020-09-26 NOTE — Progress Notes (Signed)
Cardiac Individual Treatment Plan  Patient Details  Name: Daniel Carey MRN: [id number] Date of Birth: 1951/01/20 Referring Provider:   Flowsheet Row CARDIAC REHAB PHASE II ORIENTATION from 09/26/2020 in [place]  Referring Provider Dr. Acie Fredrickson       Initial Encounter Date:  Flowsheet Row CARDIAC REHAB PHASE II ORIENTATION from 09/26/2020 in [place]  Date 09/26/20       Visit Diagnosis: S/P CABG x 3  Patient's Home Medications on Admission:  Current Outpatient Medications:    pantoprazole (PROTONIX) 40 MG tablet, TAKE 1 TABLET BY MOUTH EVERY DAY (Patient not taking: Reported on 09/26/2020), Disp: 90 tablet, Rfl: 1   acetaminophen (TYLENOL) 500 MG tablet, Take 1,000 mg by mouth every 6 (six) hours as needed (pain). , Disp: , Rfl:    albuterol (VENTOLIN HFA) 108 (90 Base) MCG/ACT inhaler, INHALE 2 PUFFS INTO THE LUNGS EVERY 6 HOURS AS NEEDED FOR WHEEZE OR SHORTNESS OF BREATH, Disp: 18 each, Rfl: 3   aspirin EC 325 MG EC tablet, Take 1 tablet (325 mg total) by mouth daily., Disp: 30 tablet, Rfl: 0   Choline Fenofibrate (FENOFIBRIC ACID) 135 MG CPDR, Take 1 tablet by mouth daily., Disp: 30 capsule, Rfl: 11   famotidine (PEPCID) 20 MG tablet, Take 20 mg by mouth daily as needed for heartburn., Disp: , Rfl:    hydrochlorothiazide (HYDRODIURIL) 25 MG tablet, Take 0.5 tablets (12.5 mg total) by mouth daily as needed. (Patient taking differently: Take 12.5 mg by mouth daily as needed. Prn swelling.), Disp: 90 tablet, Rfl: 3   ipratropium (ATROVENT) 0.06 % nasal spray, Place 2 sprays into both nostrils 4 (four) times daily. @ sprays at night daiyl as needed., Disp: 15 mL, Rfl: 5   metFORMIN (GLUCOPHAGE) 500 MG tablet, Take 1 tablet (500 mg total) by mouth 2 (two) times daily with a meal., Disp: 180 tablet, Rfl: 3   metoprolol tartrate (LOPRESSOR) 25 MG tablet, Take 0.5 tablets (12.5 mg total) by mouth 2 (two) times daily. (Patient taking  differently: Take 25 mg by mouth 2 (two) times daily. If pts HR is trends to be <50 , then decrease to 1/2 tab), Disp: 90 tablet, Rfl: 3   niacin 500 MG tablet, Take 500 mg by mouth at bedtime., Disp: , Rfl:    nitroGLYCERIN (NITROSTAT) 0.4 MG SL tablet, Place 1 tablet (0.4 mg total) under the tongue every 5 (five) minutes as needed for chest pain., Disp: 25 tablet, Rfl: 6   RABEprazole (ACIPHEX) 20 MG tablet, Take 1 tablet (20 mg total) by mouth daily in the afternoon., Disp: 30 tablet, Rfl: 11   rosuvastatin (CRESTOR) 20 MG tablet, TAKE 1 TABLET BY MOUTH EVERY DAY, Disp: 30 tablet, Rfl: 11  Past Medical History: 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    noted on EKG March 2013   CAD (coronary artery disease)    s/p atherectomy, PTCA/DES x2 mid & distal RCA 06/30/12   Carotid artery occlusion    Chest pain    Complication of anesthesia    heart rate dropping with propofol last procedure   COPD (chronic obstructive pulmonary disease) ([place])    Dyspnea    ED (erectile dysfunction)    Esophageal stricture    External hemorrhoids without mention of complication    GERD (gastroesophageal reflux disease)    Headache(784.0)    Hiatal hernia    History of  kidney stones    HLD (hyperlipidemia)    HTN (hypertension)    Myocardial infarction [facility])    1996   Personal history of colonic polyps 03/12/2010   TUBULAR ADENOMA   Presence of permanent cardiac pacemaker    Tobacco abuse    Tubular adenoma of colon    Type 2 diabetes mellitus (HCC)     Tobacco Use: Social History   Tobacco Use  Smoking Status Former   Packs/day: 0.50   Years: 40.00   Pack years: 20.00   Types: Cigarettes   Quit date: 08/25/2011   Years since quitting: 9.0  Smokeless Tobacco Never    Labs: Recent Review Flowsheet Data     Labs for ITP Cardiac and Pulmonary Rehab Latest Ref Rng & Units 07/19/2020 07/19/2020 07/19/2020 07/19/2020 07/19/2020   Cholestrol 100 - 199  mg/dL - - - - -   LDLCALC 0 - 99 mg/dL - - - - -   LDLDIRECT mg/dL - - - - -   HDL >39 mg/dL - - - - -   Trlycerides 0 - 149 mg/dL - - - - -   Hemoglobin A1c 4.8 - 5.6 % - - - - -   PHART 7.350 - 7.450 - - 7.388 7.315(L) 7.342(L)   PCO2ART 32.0 - 48.0 mmHg - - 45.7 50.3(H) 43.7   HCO3 20.0 - 28.0 mmol/L - - 27.5 25.8 23.9   TCO2 22 - 32 mmol/L 32 29 29 27 25    ACIDBASEDEF 0.0 - 2.0 mmol/L - - - 1.0 2.0   O2SAT % - - 100.0 97.0 96.0       Capillary Blood Glucose: Lab Results  Component Value Date   GLUCAP 92 09/26/2020   GLUCAP 124 (H) 07/24/2020   GLUCAP 100 (H) 07/24/2020   GLUCAP 110 (H) 07/23/2020   GLUCAP 101 (H) 07/23/2020     Exercise Target Goals: Exercise Program Goal: Individual exercise prescription set using results from initial 6 min walk test and THRR while considering  patient's activity barriers and safety.   Exercise Prescription Goal: Starting with aerobic activity 30 plus minutes a day, 3 days per week for initial exercise prescription. Provide home exercise prescription and guidelines that participant acknowledges understanding prior to discharge.  Activity Barriers & Risk Stratification:  Activity Barriers & Cardiac Risk Stratification - 09/26/20 1247       Activity Barriers & Cardiac Risk Stratification   Activity Barriers None    Cardiac Risk Stratification High             6 Minute Walk:  6 Minute Walk     Row Name 09/26/20 1422         6 Minute Walk   Phase Initial     Distance 1500 feet     Walk Time 6 minutes     # of Rest Breaks 0     MPH 2.65     METS 3.16     RPE 12     VO2 Peak 11.05     Symptoms Yes (comment)     Comments left hip pain 3/10     Resting HR 70 bpm     Resting BP 108/50     Resting Oxygen Saturation  96 %     Exercise Oxygen Saturation  during 6 min walk 94 %     Max Ex. HR 83 bpm     Max Ex. BP 134/80     2 Minute Post BP 112/78  Oxygen Initial Assessment:   Oxygen  Re-Evaluation:   Oxygen Discharge (Final Oxygen Re-Evaluation):   Initial Exercise Prescription:  Initial Exercise Prescription - 09/26/20 1400       Date of Initial Exercise RX and Referring Provider   Date 09/26/20    Referring Provider Dr. Acie Fredrickson    Expected Discharge Date 12/20/20      Treadmill   MPH 2    Grade 0    Minutes 17      Recumbant Elliptical   Level 1    RPM 60    Minutes 22      Prescription Details   Frequency (times per week) 3    Duration Progress to 30 minutes of continuous aerobic without signs/symptoms of physical distress      Intensity   THRR 40-80% of Max Heartrate 61-122    Ratings of Perceived Exertion 11-13    Perceived Dyspnea 0-4      Resistance Training   Training Prescription Yes    Weight 3 lbs    Reps 10-15             Perform Capillary Blood Glucose checks as needed.  Exercise Prescription Changes:   Exercise Comments:   Exercise Goals and Review:   Exercise Goals     Row Name 09/26/20 1425             Exercise Goals   Increase Physical Activity Yes       Intervention Provide advice, education, support and counseling about physical activity/exercise needs.;Develop an individualized exercise prescription for aerobic and resistive training based on initial evaluation findings, risk stratification, comorbidities and participant's personal goals.       Expected Outcomes Short Term: Attend rehab on a regular basis to increase amount of physical activity.;Long Term: Exercising regularly at least 3-5 days a week.;Long Term: Add in home exercise to make exercise part of routine and to increase amount of physical activity.       Increase Strength and Stamina Yes       Intervention Provide advice, education, support and counseling about physical activity/exercise needs.;Develop an individualized exercise prescription for aerobic and resistive training based on initial evaluation findings, risk stratification, comorbidities  and participant's personal goals.       Expected Outcomes Short Term: Increase workloads from initial exercise prescription for resistance, speed, and METs.;Short Term: Perform resistance training exercises routinely during rehab and add in resistance training at home;Long Term: Improve cardiorespiratory fitness, muscular endurance and strength as measured by increased METs and functional capacity (6MWT)       Able to understand and use rate of perceived exertion (RPE) scale Yes       Intervention Provide education and explanation on how to use RPE scale       Expected Outcomes Short Term: Able to use RPE daily in rehab to express subjective intensity level;Long Term:  Able to use RPE to guide intensity level when exercising independently       Knowledge and understanding of Target Heart Rate Range (THRR) Yes       Intervention Provide education and explanation of THRR including how the numbers were predicted and where they are located for reference       Expected Outcomes Short Term: Able to state/look up THRR;Long Term: Able to use THRR to govern intensity when exercising independently;Short Term: Able to use daily as guideline for intensity in rehab       Able to check pulse independently Yes  Intervention Provide education and demonstration on how to check pulse in carotid and radial arteries.;Review the importance of being able to check your own pulse for safety during independent exercise       Expected Outcomes Long Term: Able to check pulse independently and accurately;Short Term: Able to explain why pulse checking is important during independent exercise       Understanding of Exercise Prescription Yes       Intervention Provide education, explanation, and written materials on patient's individual exercise prescription       Expected Outcomes Short Term: Able to explain program exercise prescription;Long Term: Able to explain home exercise prescription to exercise independently                 Exercise Goals Re-Evaluation :    Discharge Exercise Prescription (Final Exercise Prescription Changes):   Nutrition:  Target Goals: Understanding of nutrition guidelines, daily intake of sodium 1500mg , cholesterol 200mg , calories 30% from fat and 7% or less from saturated fats, daily to have 5 or more servings of fruits and vegetables.  Biometrics:  Pre Biometrics - 09/26/20 1426       Pre Biometrics   Height 5\' 10"  (1.778 m)    Weight 83.4 kg    Waist Circumference 41 inches    Hip Circumference 41 inches    Waist to Hip Ratio 1 %    BMI (Calculated) 26.38    Triceps Skinfold 11 mm    % Body Fat 26.1 %    Grip Strength 34.7 kg    Flexibility 12.5 in    Single Leg Stand 8.82 seconds              Nutrition Therapy Plan and Nutrition Goals:  Nutrition Therapy & Goals - 09/26/20 1357       Personal Nutrition Goals   Comments Patient scored 28 on his diet assessment. Score discussed and handout provided and explained regarding healthier choices and handouts on DM control. We provide 2 educational sessions regarding heart healthy nutrition with handouts and offer assistance with RD referral if patient is interested.      Intervention Plan   Intervention Nutrition handout(s) given to patient.             Nutrition Assessments:  Nutrition Assessments - 09/26/20 1357       MEDFICTS Scores   Pre Score 28            MEDIFICTS Score Key: ?70 Need to make dietary changes  40-70 Heart Healthy Diet ? 40 Therapeutic Level Cholesterol Diet   Picture Your Plate Scores: <76 Unhealthy dietary pattern with much room for improvement. 41-50 Dietary pattern unlikely to meet recommendations for good health and room for improvement. 51-60 More healthful dietary pattern, with some room for improvement.  >60 Healthy dietary pattern, although there may be some specific behaviors that could be improved.    Nutrition Goals Re-Evaluation:   Nutrition Goals  Discharge (Final Nutrition Goals Re-Evaluation):   Psychosocial: Target Goals: Acknowledge presence or absence of significant depression and/or stress, maximize coping skills, provide positive support system. Participant is able to verbalize types and ability to use techniques and skills needed for reducing stress and depression.  Initial Review & Psychosocial Screening:  Initial Psych Review & Screening - 09/26/20 1401       Initial Review   Current issues with None Identified      Family Dynamics   Good Support System? Yes      Barriers  Psychosocial barriers to participate in program There are no identifiable barriers or psychosocial needs.      Screening Interventions   Interventions Encouraged to exercise;Other (comment)    Expected Outcomes Short Term goal: Identification and review with participant of any Quality of Life or Depression concerns found by scoring the questionnaire.             Quality of Life Scores:  Quality of Life - 09/26/20 1422       Quality of Life   Select Quality of Life      Quality of Life Scores   Health/Function Pre 22.25 %    Socioeconomic Pre 27 %    Psych/Spiritual Pre 26.57 %    Family Pre 27.6 %    GLOBAL Pre 24.98 %            Scores of 19 and below usually indicate a poorer quality of life in these areas.  A difference of  2-3 points is a clinically meaningful difference.  A difference of 2-3 points in the total score of the Quality of Life Index has been associated with significant improvement in overall quality of life, self-image, physical symptoms, and general health in studies assessing change in quality of life.  PHQ-9: Recent Review Flowsheet Data     Depression screen [facility] 2/9 09/26/2020 03/01/2020 12/02/2018   Decreased Interest 0 0 0   Down, Depressed, Hopeless 0 0 0   PHQ - 2 Score 0 0 0   Altered sleeping 2 1 -   Tired, decreased energy 3 1 -   Change in appetite 0 1 -   Feeling bad or failure about yourself   0 0 -   Trouble concentrating 0 0 -   Moving slowly or fidgety/restless 0 0 -   Suicidal thoughts 0 0 -   PHQ-9 Score 5 3 -   Difficult doing work/chores Not difficult at all Not difficult at all -      Interpretation of Total Score  Total Score Depression Severity:  1-4 = Minimal depression, 5-9 = Mild depression, 10-14 = Moderate depression, 15-19 = Moderately severe depression, 20-27 = Severe depression   Psychosocial Evaluation and Intervention:  Psychosocial Evaluation - 09/26/20 1402       Psychosocial Evaluation & Interventions   Interventions Stress management education;Relaxation education;Encouraged to exercise with the program and follow exercise prescription    Comments Patient has no psychosocial barrers to participate in CR at his orientation visit. He denies any depression or anxiety. His initial PHQ-9 score was 5 related to physical symptoms since his CABG. He names his wife as his support system. He has a positive outlook and is ready to get stronger and be able to return to work. He demonstrates an interest to improving his health.    Expected Outcomes Patient will continue to have no psychosocial issues identified.    Continue Psychosocial Services  No Follow up required             Psychosocial Re-Evaluation:   Psychosocial Discharge (Final Psychosocial Re-Evaluation):   Vocational Rehabilitation: Provide vocational rehab assistance to qualifying candidates.   Vocational Rehab Evaluation & Intervention:  Vocational Rehab - 09/26/20 1356       Initial Vocational Rehab Evaluation & Intervention   Assessment shows need for Vocational Rehabilitation No      Vocational Rehab Re-Evaulation   Comments Patient has a job and plans to return to work after he completes the program. He does not need  vocational rehab.             Education: Education Goals: Education classes will be provided on a weekly basis, covering required topics. Participant will  state understanding/return demonstration of topics presented.  Learning Barriers/Preferences:  Learning Barriers/Preferences - 09/26/20 1359       Learning Barriers/Preferences   Learning Barriers None    Learning Preferences Audio;Video;Written Material             Education Topics: Hypertension, Hypertension Reduction -Define heart disease and high blood pressure. Discus how high blood pressure affects the body and ways to reduce high blood pressure.   Exercise and Your Heart -Discuss why it is important to exercise, the FITT principles of exercise, normal and abnormal responses to exercise, and how to exercise safely.   Angina -Discuss definition of angina, causes of angina, treatment of angina, and how to decrease risk of having angina.   Cardiac Medications -Review what the following cardiac medications are used for, how they affect the body, and side effects that may occur when taking the medications.  Medications include Aspirin, Beta blockers, calcium channel blockers, ACE Inhibitors, angiotensin receptor blockers, diuretics, digoxin, and antihyperlipidemics.   Congestive Heart Failure -Discuss the definition of CHF, how to live with CHF, the signs and symptoms of CHF, and how keep track of weight and sodium intake.   Heart Disease and Intimacy -Discus the effect sexual activity has on the heart, how changes occur during intimacy as we age, and safety during sexual activity.   Smoking Cessation / COPD -Discuss different methods to quit smoking, the health benefits of quitting smoking, and the definition of COPD.   Nutrition I: Fats -Discuss the types of cholesterol, what cholesterol does to the heart, and how cholesterol levels can be controlled.   Nutrition II: Labels   -Discuss the different components of food labels and how to read food label   Heart Parts/Heart Disease and PAD -Discuss the anatomy of the heart, the pathway of blood circulation  through the heart, and these are affected by heart disease.   Stress I: Signs and Symptoms -Discuss the causes of stress, how stress may lead to anxiety and depression, and ways to limit stress.   Stress II: Relaxation -Discuss different types of relaxation techniques to limit stress.   Warning Signs of Stroke / TIA -Discuss definition of a stroke, what the signs and symptoms are of a stroke, and how to identify when someone is having stroke.   Knowledge Questionnaire Score:  Knowledge Questionnaire Score - 09/26/20 1357       Knowledge Questionnaire Score   Pre Score 22/24             Core Components/Risk Factors/Patient Goals at Admission:  Personal Goals and Risk Factors at Admission - 09/26/20 1359       Core Components/Risk Factors/Patient Goals on Admission    Weight Management Weight Maintenance    Diabetes Yes    Intervention Provide education about signs/symptoms and action to take for hypo/hyperglycemia.;Provide education about proper nutrition, including hydration, and aerobic/resistive exercise prescription along with prescribed medications to achieve blood glucose in normal ranges: Fasting glucose 65-99 mg/dL    Expected Outcomes Short Term: Participant verbalizes understanding of the signs/symptoms and immediate care of hyper/hypoglycemia, proper foot care and importance of medication, aerobic/resistive exercise and nutrition plan for blood glucose control.;Long Term: Attainment of HbA1C < 7%.    Personal Goal Other Yes    Personal Goal Get stronger; improve lung function;  be able to return to work.    Intervention Patient will attend CR 3 days/week and supplement with exersice at  home 2 days/week.    Expected Outcomes Patient will complete the program meeting both program and personal goals.             Core Components/Risk Factors/Patient Goals Review:    Core Components/Risk Factors/Patient Goals at Discharge (Final Review):    ITP Comments:  Patient arrived for 1st visit/orientation/education at 1230. Patient was referred to CR by Gilford Raid, MD due to S/P CABGx3. During orientation advised patient on arrival and appointment times what to wear, what to do before, during and after exercise. Reviewed attendance and class policy.  Pt is scheduled to return Cardiac Rehab on 09/30/20 at 2:45. Pt was advised to come to class 15 minutes before class starts.  Discussed RPE/Dpysnea scales. Patient participated in warm up stretches. Patient was able to complete 6 minute walk test.  Telemetry:Ventricular paced rhythm. Patient was measured for the equipment. Discussed equipment safety with patient. Took patient pre-anthropometric measurements. Patient finished visit at 1400.     Comments:

## 2020-09-26 NOTE — Progress Notes (Signed)
Cardiac/Pulmonary Rehab Medication Review by a Pharmacist  Does the patient  feel that his/her medications are working for him/her?  yes  Has the patient been experiencing any side effects to the medications prescribed?  yes  Does the patient measure his/her own blood pressure or blood glucose at home?  yes   Does the patient have any problems obtaining medications due to transportation or finances?   no  Understanding of regimen: excellent Understanding of indications: excellent Potential of compliance: excellent  Questions asked to Determine Patient Understanding of Medication Regimen:  1. What is the name of the medication?  2. What is the medication used for?  3. When should it be taken?  4. How much should be taken?  5. How will you take it?  6. What side effects should you report?  Understanding Defined as: Excellent: All questions above are correct Good: Questions 1-4 are correct Fair: Questions 1-2 are correct  Poor: 1 or none of the above questions are correct   Pharmacist comments: Patient presents today for cardiac rehab. Patient had CABG x 3. We reviewed his medications and he is tolerating except he does have a couple of concerns. The metformin makes him nauseated sometimes, but has gotten better. He had some issues with atorvastatin and changed to rosuvastatin which he is tolerating. Monitors HR,BS and BP. Hoping he will continue to lose weight and will not need the metformin. Patient exercises on treadmill  for 20 minutes 3 x per week. He is doing well.  Continue current regimen   Thanks for the opportunity to participate in the care of this patient,  Isac Sarna, BS [place]ena [place], [place] Pharmacist Pager [phone]  09/26/2020 1:22 PM

## 2020-09-30 ENCOUNTER — Other Ambulatory Visit: Payer: Self-pay

## 2020-09-30 ENCOUNTER — Encounter (HOSPITAL_COMMUNITY)
Admission: RE | Admit: 2020-09-30 | Discharge: 2020-09-30 | Disposition: A | Discharge status: Home / Self Care | Payer: BC Managed Care – PPO | Source: Ambulatory Visit | Attending: Surgery | Admitting: Surgery

## 2020-09-30 DIAGNOSIS — Z951 Presence of aortocoronary bypass graft: Secondary | ICD-10-CM | POA: Diagnosis not present

## 2020-09-30 LAB — GLUCOSE, CAPILLARY: Glucose-Capillary: 120 mg/dL — ABNORMAL HIGH (ref 70–99)

## 2020-09-30 NOTE — Telephone Encounter (Signed)
RN returned call to [name] at cardiac rehab. Daniel Carey states that patient was needing a letter clearing him from surgery. Per Dr. Acie Fredrickson patient needs to receive that letter from Dr.Bartle's office clearing him from surgery. Debbie verbalized understanding.

## 2020-09-30 NOTE — Progress Notes (Signed)
Daily Session Note  Patient Details  Name: Daniel Carey MRN: [id number] Date of Birth: [birth year] Referring Provider:   Flowsheet Row CARDIAC REHAB PHASE II ORIENTATION from 09/26/2020 in [facility]  Referring Provider Dr. Acie Fredrickson       Encounter Date: 09/30/2020  Check In:  Session Check In - 09/30/20 1445       Check-In   Supervising physician immediately available to respond to emergencies [facility] MD immediately available    Physician(s) Dr. Johnsie Cancel    Location [facility]    Staff Present Hoy Register, MS, ACSM-CEP, Exercise Physiologist;Vencent Hauschild, RN    Virtual Visit No    Medication changes reported     No    Fall or balance concerns reported    No    Tobacco Cessation No Change    Warm-up and Cool-down Performed as group-led instruction    Resistance Training Performed No    VAD Patient? No    PAD/SET Patient? No      Pain Assessment   Currently in Pain? No/denies    Pain Score 0-No pain    Multiple Pain Sites No             Capillary Blood Glucose: Results for orders placed or performed during the hospital encounter of 09/30/20 (from the past 24 hour(s))  Glucose, capillary     Status: Abnormal   Collection Time: 09/30/20  2:42 PM  Result Value Ref Range   Glucose-Capillary 120 (H) 70 - 99 mg/dL      Social History   Tobacco Use  Smoking Status Former   Packs/day: 0.50   Years: 40.00   Pack years: 20.00   Types: Cigarettes   Quit date: 08/25/2011   Years since quitting: 9.1  Smokeless Tobacco Never    Goals Met:  Independence with exercise equipment Exercise tolerated well No report of cardiac concerns or symptoms Strength training completed today  Goals Unmet:  Not Applicable  Comments: check out @ 3:45pm   Dr. Kathie Dike is Medical Director for [facility].

## 2020-10-02 ENCOUNTER — Other Ambulatory Visit: Payer: Self-pay

## 2020-10-02 ENCOUNTER — Encounter (HOSPITAL_COMMUNITY)
Admission: RE | Admit: 2020-10-02 | Discharge: 2020-10-02 | Disposition: A | Discharge status: Home / Self Care | Payer: BC Managed Care – PPO | Source: Ambulatory Visit | Attending: Surgery | Admitting: Surgery

## 2020-10-02 DIAGNOSIS — Z951 Presence of aortocoronary bypass graft: Secondary | ICD-10-CM

## 2020-10-02 NOTE — Progress Notes (Signed)
Daily Session Note  Patient Details  Name: Daniel Carey MRN: [id number] Date of Birth: 09-28-1950 Referring Provider:   Flowsheet Row CARDIAC REHAB PHASE II ORIENTATION from 09/26/2020 in [facility]  Referring Provider Dr. Acie Fredrickson       Encounter Date: 10/02/2020  Check In:  Session Check In - 10/02/20 1445       Check-In   Supervising physician immediately available to respond to emergencies [facility] MD immediately available    Physician(s) Dr. Johnsie Cancel    Location [facility]    Staff Present Geanie Cooley, Kermit Balo, RN, BSN    Virtual Visit No    Medication changes reported     No    Fall or balance concerns reported    No    Tobacco Cessation No Change    Warm-up and Cool-down Performed as group-led instruction    Resistance Training Performed Yes    VAD Patient? No    PAD/SET Patient? No      Pain Assessment   Currently in Pain? No/denies    Pain Score 0-No pain    Multiple Pain Sites No             Capillary Blood Glucose: No results found for this or any previous visit (from the past 24 hour(s)).    Social History   Tobacco Use  Smoking Status Former   Packs/day: 0.50   Years: 40.00   Pack years: 20.00   Types: Cigarettes   Quit date: 08/25/2011   Years since quitting: 9.1  Smokeless Tobacco Never    Goals Met:  Independence with exercise equipment Exercise tolerated well No report of cardiac concerns or symptoms Strength training completed today  Goals Unmet:  Not Applicable  Comments: check out @ 2:45pm   Dr. Kathie Dike is Medical Director for [facility].

## 2020-10-03 ENCOUNTER — Other Ambulatory Visit: Payer: Self-pay

## 2020-10-03 DIAGNOSIS — R079 Chest pain, unspecified: Secondary | ICD-10-CM

## 2020-10-04 ENCOUNTER — Other Ambulatory Visit: Payer: Self-pay

## 2020-10-04 ENCOUNTER — Encounter (HOSPITAL_COMMUNITY)
Admission: RE | Admit: 2020-10-04 | Discharge: 2020-10-04 | Disposition: A | Discharge status: Home / Self Care | Payer: BC Managed Care – PPO | Source: Ambulatory Visit | Attending: Surgery | Admitting: Surgery

## 2020-10-04 DIAGNOSIS — Z951 Presence of aortocoronary bypass graft: Secondary | ICD-10-CM

## 2020-10-04 NOTE — Progress Notes (Signed)
Daily Session Note  Patient Details  Name: Daniel Carey MRN: [id number] Date of Birth: 1950/11/20 Referring Provider:   Flowsheet Row CARDIAC REHAB PHASE II ORIENTATION from 09/26/2020 in [place]  Referring Provider Dr. Acie Fredrickson       Encounter Date: 10/04/2020  Check In:  Session Check In - 10/04/20 1445       Check-In   Supervising physician immediately available to respond to emergencies [facility] MD immediately available    Physician(s) Dr. Harl Bowie    Location [facility]    Staff Present Hoy Register, MS, ACSM-CEP, Exercise Physiologist;Debra Wynetta Emery, RN, BSN    Virtual Visit No    Medication changes reported     No    Fall or balance concerns reported    No    Tobacco Cessation No Change    Warm-up and Cool-down Performed as group-led instruction    Resistance Training Performed Yes    VAD Patient? No    PAD/SET Patient? No      Pain Assessment   Currently in Pain? No/denies    Pain Score 0-No pain    Multiple Pain Sites No             Capillary Blood Glucose: No results found for this or any previous visit (from the past 24 hour(s)).    Social History   Tobacco Use  Smoking Status Former   Packs/day: 0.50   Years: 40.00   Pack years: 20.00   Types: Cigarettes   Quit date: 08/25/2011   Years since quitting: 9.1  Smokeless Tobacco Never    Goals Met:  Independence with exercise equipment Exercise tolerated well No report of cardiac concerns or symptoms Strength training completed today  Goals Unmet:  Not Applicable  Comments: checkout time is 1545   Dr. Kathie Dike is Medical Director for [facility].

## 2020-10-07 ENCOUNTER — Encounter (HOSPITAL_COMMUNITY)
Admission: RE | Admit: 2020-10-07 | Discharge: 2020-10-07 | Disposition: A | Discharge status: Home / Self Care | Payer: BC Managed Care – PPO | Source: Ambulatory Visit | Attending: Surgery | Admitting: Surgery

## 2020-10-07 ENCOUNTER — Other Ambulatory Visit: Payer: Self-pay

## 2020-10-07 VITALS — Wt 184.3 lb

## 2020-10-07 DIAGNOSIS — Z951 Presence of aortocoronary bypass graft: Secondary | ICD-10-CM

## 2020-10-07 NOTE — Progress Notes (Signed)
Daily Session Note  Patient Details  Name: Daniel Carey MRN: [id number] Date of Birth: 1950/09/09 Referring Provider:   Flowsheet Row CARDIAC REHAB PHASE II ORIENTATION from 09/26/2020 in [place] [place]  Referring Provider Dr. Acie Fredrickson       Encounter Date: 10/07/2020  Check In:  Session Check In - 10/07/20 1455       Check-In   Supervising physician immediately available to respond to emergencies [facility] MD immediately available    Physician(s) Dr. Harrington Challenger    Location [facility]    Staff Present Aundra Dubin, RN, Bjorn Loser, MS, ACSM-CEP, Exercise Physiologist    Virtual Visit No    Medication changes reported     No    Fall or balance concerns reported    No    Tobacco Cessation No Change    Warm-up and Cool-down Performed as group-led instruction    Resistance Training Performed Yes    VAD Patient? No    PAD/SET Patient? No      Pain Assessment   Currently in Pain? No/denies    Pain Score 0-No pain    Multiple Pain Sites No             Capillary Blood Glucose: No results found for this or any previous visit (from the past 24 hour(s)).    Social History   Tobacco Use  Smoking Status Former   Packs/day: 0.50   Years: 40.00   Pack years: 20.00   Types: Cigarettes   Quit date: 08/25/2011   Years since quitting: 9.1  Smokeless Tobacco Never    Goals Met:  Independence with exercise equipment Exercise tolerated well No report of cardiac concerns or symptoms Strength training completed today  Goals Unmet:  Not Applicable  Comments: Check out 1545.   Dr. Kathie Dike is Medical Director for [facility].

## 2020-10-09 ENCOUNTER — Encounter (HOSPITAL_COMMUNITY): Payer: BC Managed Care – PPO

## 2020-10-09 ENCOUNTER — Encounter: Payer: Self-pay | Admitting: Surgery

## 2020-10-09 ENCOUNTER — Encounter: Payer: Self-pay | Admitting: Internal Medicine

## 2020-10-09 ENCOUNTER — Ambulatory Visit
Admission: RE | Admit: 2020-10-09 | Discharge: 2020-10-09 | Disposition: A | Discharge status: Home / Self Care | Payer: BC Managed Care – PPO | Source: Ambulatory Visit | Attending: Surgery | Admitting: Surgery

## 2020-10-09 ENCOUNTER — Ambulatory Visit (INDEPENDENT_AMBULATORY_CARE_PROVIDER_SITE_OTHER): Payer: Self-pay | Admitting: Surgery

## 2020-10-09 ENCOUNTER — Other Ambulatory Visit: Payer: Self-pay

## 2020-10-09 VITALS — BP 113/72 | HR 70 | Resp 20 | Wt 184.0 lb

## 2020-10-09 DIAGNOSIS — R079 Chest pain, unspecified: Secondary | ICD-10-CM

## 2020-10-09 DIAGNOSIS — Z951 Presence of aortocoronary bypass graft: Secondary | ICD-10-CM

## 2020-10-09 NOTE — Progress Notes (Signed)
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  He recently started cardiac rehab and feels like that is going to increase his stamina.  He does not feel like he is ready to return to work.  He denies any shortness of breath.  He has some mild chest wall discomfort.  Current Outpatient Medications  Medication Sig Dispense Refill   acetaminophen (TYLENOL) 500 MG tablet Take 1,000 mg by mouth every 6 (six) hours as needed (pain).      albuterol (VENTOLIN HFA) 108 (90 Base) MCG/ACT inhaler INHALE 2 PUFFS INTO THE LUNGS EVERY 6 HOURS AS NEEDED FOR WHEEZE OR SHORTNESS OF BREATH 18 each 3   aspirin EC 325 MG EC tablet Take 1 tablet (325 mg total) by mouth daily. 30 tablet 0   Choline Fenofibrate (FENOFIBRIC ACID) 135 MG CPDR Take 1 tablet by mouth daily. 30 capsule 11   famotidine (PEPCID) 20 MG tablet Take 20 mg by mouth daily as needed for heartburn.     hydrochlorothiazide (HYDRODIURIL) 25 MG tablet Take 0.5 tablets (12.5 mg total) by mouth daily as needed. (Patient taking differently: Take 12.5 mg by mouth daily as needed. Prn swelling.) 90 tablet 3   ipratropium (ATROVENT) 0.06 % nasal spray Place 2 sprays into both nostrils 4 (four) times daily. @ sprays at night daiyl as needed. 15 mL 5   metFORMIN (GLUCOPHAGE) 500 MG tablet Take 1 tablet (500 mg total) by mouth 2 (two) times daily with a meal. 180 tablet 3   metoprolol tartrate (LOPRESSOR) 25 MG tablet Take 0.5 tablets (12.5 mg total) by mouth 2 (two) times daily. (Patient taking differently: Take 25 mg by mouth 2 (two) times daily. If pts HR is trends to be <50 , then decrease to 1/2 tab) 90 tablet 3   niacin 500 MG tablet Take 500 mg by mouth at bedtime.     nitroGLYCERIN (NITROSTAT) 0.4 MG SL tablet Place 1 tablet (0.4 mg total) under the tongue every 5 (five) minutes as needed for chest pain. 25 tablet 6   pantoprazole (PROTONIX) 40 MG tablet TAKE 1 TABLET BY MOUTH EVERY DAY  90 tablet 1   RABEprazole (ACIPHEX) 20 MG tablet Take 1 tablet (20 mg total) by mouth daily in the afternoon. 30 tablet 11   rosuvastatin (CRESTOR) 20 MG tablet TAKE 1 TABLET BY MOUTH EVERY DAY 30 tablet 11   No current facility-administered medications for this visit.     Physical Exam: BP 113/72   Pulse 70   Resp 20   Wt 184 lb (83.5 kg)   SpO2 96% Comment: RA  BMI 26.40 kg/m  He looks well. Lungs clear. Cardiac exam shows a regular rate and rhythm with normal heart sounds. The incisions are healing well and the sternum is stable.  Diagnostic Tests:  Narrative & Impression  CLINICAL DATA:  Status post CABG.  Chest pain.   EXAM: CHEST - 2 VIEW   COMPARISON:  08/21/2020.  06/21/2018.  11/30/2016.   FINDINGS: Cardiac pacer noted stable position. Prior CABG. Heart size normal. Bilateral nipple shadows again noted. No focal infiltrate. No pleural effusion or pneumothorax. Stable bilateral pleural thickening consistent with scarring. Degenerative change thoracic spine.   IMPRESSION: 1.  Cardiac pacer stable position.  Heart size normal.   2.  No acute cardiopulmonary disease.     Electronically Signed   By: Marcello Moores  Register   On: 10/09/2020 11:49  Impression:  He is doing well almost 3 months following coronary artery bypass graft surgery.  I encouraged him to continue cardiac rehab.  He is not ready to return to work and would benefit from continuing cardiac rehab to build up his stamina.  I advised him not to lift anything heavier than 10 pounds for 3 months postoperatively.  Plan:  He will continue cardiac rehab.  I told him that I would not recommend returning to work at this time so that he can continue to build up his stamina and cardiac rehab.  He will continue to follow-up with his PCP and cardiology and will return to see me if he has any problems with his incisions.   Gaye Pollack, MD [facility] [phone]

## 2020-10-09 NOTE — Progress Notes (Signed)
Cardiac Individual Treatment Plan  Patient Details  Name: SAMAJ WESSELLS MRN: [id number] Date of Birth: [birth year] Referring Provider:   Flowsheet Row CARDIAC REHAB PHASE II ORIENTATION from 09/26/2020 in [place]  Referring Provider Dr. Acie Fredrickson       Initial Encounter Date:  Flowsheet Row CARDIAC REHAB PHASE II ORIENTATION from 09/26/2020 in [facility]  Date 09/26/20       Visit Diagnosis: S/P CABG x 3  Patient's Home Medications on Admission:  Current Outpatient Medications:    pantoprazole (PROTONIX) 40 MG tablet, TAKE 1 TABLET BY MOUTH EVERY DAY (Patient not taking: Reported on 09/26/2020), Disp: 90 tablet, Rfl: 1   acetaminophen (TYLENOL) 500 MG tablet, Take 1,000 mg by mouth every 6 (six) hours as needed (pain). , Disp: , Rfl:    albuterol (VENTOLIN HFA) 108 (90 Base) MCG/ACT inhaler, INHALE 2 PUFFS INTO THE LUNGS EVERY 6 HOURS AS NEEDED FOR WHEEZE OR SHORTNESS OF BREATH, Disp: 18 each, Rfl: 3   aspirin EC 325 MG EC tablet, Take 1 tablet (325 mg total) by mouth daily., Disp: 30 tablet, Rfl: 0   Choline Fenofibrate (FENOFIBRIC ACID) 135 MG CPDR, Take 1 tablet by mouth daily., Disp: 30 capsule, Rfl: 11   famotidine (PEPCID) 20 MG tablet, Take 20 mg by mouth daily as needed for heartburn., Disp: , Rfl:    hydrochlorothiazide (HYDRODIURIL) 25 MG tablet, Take 0.5 tablets (12.5 mg total) by mouth daily as needed. (Patient taking differently: Take 12.5 mg by mouth daily as needed. Prn swelling.), Disp: 90 tablet, Rfl: 3   ipratropium (ATROVENT) 0.06 % nasal spray, Place 2 sprays into both nostrils 4 (four) times daily. @ sprays at night daiyl as needed., Disp: 15 mL, Rfl: 5   metFORMIN (GLUCOPHAGE) 500 MG tablet, Take 1 tablet (500 mg total) by mouth 2 (two) times daily with a meal., Disp: 180 tablet, Rfl: 3   metoprolol tartrate (LOPRESSOR) 25 MG tablet, Take 0.5 tablets (12.5 mg total) by mouth 2 (two) times daily. (Patient taking  differently: Take 25 mg by mouth 2 (two) times daily. If pts HR is trends to be <50 , then decrease to 1/2 tab), Disp: 90 tablet, Rfl: 3   niacin 500 MG tablet, Take 500 mg by mouth at bedtime., Disp: , Rfl:    nitroGLYCERIN (NITROSTAT) 0.4 MG SL tablet, Place 1 tablet (0.4 mg total) under the tongue every 5 (five) minutes as needed for chest pain., Disp: 25 tablet, Rfl: 6   RABEprazole (ACIPHEX) 20 MG tablet, Take 1 tablet (20 mg total) by mouth daily in the afternoon., Disp: 30 tablet, Rfl: 11   rosuvastatin (CRESTOR) 20 MG tablet, TAKE 1 TABLET BY MOUTH EVERY DAY, Disp: 30 tablet, Rfl: 11  Past Medical History: 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    noted on EKG March 2013   CAD (coronary artery disease)    s/p atherectomy, PTCA/DES x2 mid & distal RCA 06/30/12   Carotid artery occlusion    Chest pain    Complication of anesthesia    heart rate dropping with propofol last procedure   COPD (chronic obstructive pulmonary disease) ([facility])    Dyspnea    ED (erectile dysfunction)    Esophageal stricture    External hemorrhoids without mention of complication    GERD (gastroesophageal reflux disease)    Headache(784.0)    Hiatal hernia    History of  kidney stones    HLD (hyperlipidemia)    HTN (hypertension)    Myocardial infarction [facility])    1996   Personal history of colonic polyps 03/12/2010   TUBULAR ADENOMA   Presence of permanent cardiac pacemaker    Tobacco abuse    Tubular adenoma of colon    Type 2 diabetes mellitus (HCC)     Tobacco Use: Social History   Tobacco Use  Smoking Status Former   Packs/day: 0.50   Years: 40.00   Pack years: 20.00   Types: Cigarettes   Quit date: 08/25/2011   Years since quitting: 9.1  Smokeless Tobacco Never    Labs: Recent Review Flowsheet Data     Labs for ITP Cardiac and Pulmonary Rehab Latest Ref Rng & Units 07/19/2020 07/19/2020 07/19/2020 07/19/2020 07/19/2020   Cholestrol 100 - 199  mg/dL - - - - -   LDLCALC 0 - 99 mg/dL - - - - -   LDLDIRECT mg/dL - - - - -   HDL >39 mg/dL - - - - -   Trlycerides 0 - 149 mg/dL - - - - -   Hemoglobin A1c 4.8 - 5.6 % - - - - -   PHART 7.350 - 7.450 - - 7.388 7.315(L) 7.342(L)   PCO2ART 32.0 - 48.0 mmHg - - 45.7 50.3(H) 43.7   HCO3 20.0 - 28.0 mmol/L - - 27.5 25.8 23.9   TCO2 22 - 32 mmol/L 32 29 29 27 25    ACIDBASEDEF 0.0 - 2.0 mmol/L - - - 1.0 2.0   O2SAT % - - 100.0 97.0 96.0       Capillary Blood Glucose: Lab Results  Component Value Date   GLUCAP 120 (H) 09/30/2020   GLUCAP 92 09/26/2020   GLUCAP 124 (H) 07/24/2020   GLUCAP 100 (H) 07/24/2020   GLUCAP 110 (H) 07/23/2020     Exercise Target Goals: Exercise Program Goal: Individual exercise prescription set using results from initial 6 min walk test and THRR while considering  patient's activity barriers and safety.   Exercise Prescription Goal: Starting with aerobic activity 30 plus minutes a day, 3 days per week for initial exercise prescription. Provide home exercise prescription and guidelines that participant acknowledges understanding prior to discharge.  Activity Barriers & Risk Stratification:  Activity Barriers & Cardiac Risk Stratification - 09/26/20 1247       Activity Barriers & Cardiac Risk Stratification   Activity Barriers None    Cardiac Risk Stratification High             6 Minute Walk:  6 Minute Walk     Row Name 09/26/20 1422         6 Minute Walk   Phase Initial     Distance 1500 feet     Walk Time 6 minutes     # of Rest Breaks 0     MPH 2.65     METS 3.16     RPE 12     VO2 Peak 11.05     Symptoms Yes (comment)     Comments left hip pain 3/10     Resting HR 70 bpm     Resting BP 108/50     Resting Oxygen Saturation  96 %     Exercise Oxygen Saturation  during 6 min walk 94 %     Max Ex. HR 83 bpm     Max Ex. BP 134/80     2 Minute Post BP 112/78  Oxygen Initial Assessment:   Oxygen  Re-Evaluation:   Oxygen Discharge (Final Oxygen Re-Evaluation):   Initial Exercise Prescription:  Initial Exercise Prescription - 09/26/20 1400       Date of Initial Exercise RX and Referring Provider   Date 09/26/20    Referring Provider Dr. Acie Fredrickson    Expected Discharge Date 12/20/20      Treadmill   MPH 2    Grade 0    Minutes 17      Recumbant Elliptical   Level 1    RPM 60    Minutes 22      Prescription Details   Frequency (times per week) 3    Duration Progress to 30 minutes of continuous aerobic without signs/symptoms of physical distress      Intensity   THRR 40-80% of Max Heartrate 61-122    Ratings of Perceived Exertion 11-13    Perceived Dyspnea 0-4      Resistance Training   Training Prescription Yes    Weight 3 lbs    Reps 10-15             Perform Capillary Blood Glucose checks as needed.  Exercise Prescription Changes:   Exercise Prescription Changes     Row Name 10/07/20 1500             Response to Exercise   Blood Pressure (Admit) 130/86       Blood Pressure (Exercise) 180/100       Blood Pressure (Exit) 130/70       Heart Rate (Admit) 72 bpm       Heart Rate (Exercise) 103 bpm       Heart Rate (Exit) 81 bpm       Rating of Perceived Exertion (Exercise) 13       Duration Continue with 30 min of aerobic exercise without signs/symptoms of physical distress.       Intensity THRR unchanged               Progression     Progression Continue to progress workloads to maintain intensity without signs/symptoms of physical distress.               Resistance Training     Training Prescription Yes       Weight 5 lbs       Reps 10-15       Time 10 Minutes               Treadmill     MPH 2       Grade 0       Minutes 22       METs 2.53               Recumbant Elliptical     Level 1       RPM 63       Minutes 17       METs 3.7               Exercise Comments:   Exercise Goals and Review:   Exercise Goals      Row Name 09/26/20 1425 10/08/20 0819           Exercise Goals   Increase Physical Activity Yes Yes      Intervention Provide advice, education, support and counseling about physical activity/exercise needs.;Develop an individualized exercise prescription for aerobic and resistive training based on initial evaluation findings, risk stratification, comorbidities and participant's personal goals. Provide advice, education, support  and counseling about physical activity/exercise needs.;Develop an individualized exercise prescription for aerobic and resistive training based on initial evaluation findings, risk stratification, comorbidities and participant's personal goals.      Expected Outcomes Short Term: Attend rehab on a regular basis to increase amount of physical activity.;Long Term: Exercising regularly at least 3-5 days a week.;Long Term: Add in home exercise to make exercise part of routine and to increase amount of physical activity. Short Term: Attend rehab on a regular basis to increase amount of physical activity.;Long Term: Exercising regularly at least 3-5 days a week.;Long Term: Add in home exercise to make exercise part of routine and to increase amount of physical activity.      Increase Strength and Stamina Yes Yes      Intervention Provide advice, education, support and counseling about physical activity/exercise needs.;Develop an individualized exercise prescription for aerobic and resistive training based on initial evaluation findings, risk stratification, comorbidities and participant's personal goals. Provide advice, education, support and counseling about physical activity/exercise needs.;Develop an individualized exercise prescription for aerobic and resistive training based on initial evaluation findings, risk stratification, comorbidities and participant's personal goals.      Expected Outcomes Short Term: Increase workloads from initial exercise prescription for resistance, speed,  and METs.;Short Term: Perform resistance training exercises routinely during rehab and add in resistance training at home;Long Term: Improve cardiorespiratory fitness, muscular endurance and strength as measured by increased METs and functional capacity (6MWT) Short Term: Increase workloads from initial exercise prescription for resistance, speed, and METs.;Short Term: Perform resistance training exercises routinely during rehab and add in resistance training at home;Long Term: Improve cardiorespiratory fitness, muscular endurance and strength as measured by increased METs and functional capacity (6MWT)      Able to understand and use rate of perceived exertion (RPE) scale Yes Yes      Intervention Provide education and explanation on how to use RPE scale Provide education and explanation on how to use RPE scale      Expected Outcomes Short Term: Able to use RPE daily in rehab to express subjective intensity level;Long Term:  Able to use RPE to guide intensity level when exercising independently Short Term: Able to use RPE daily in rehab to express subjective intensity level;Long Term:  Able to use RPE to guide intensity level when exercising independently      Knowledge and understanding of Target Heart Rate Range (THRR) Yes Yes      Intervention Provide education and explanation of THRR including how the numbers were predicted and where they are located for reference Provide education and explanation of THRR including how the numbers were predicted and where they are located for reference      Expected Outcomes Short Term: Able to state/look up THRR;Long Term: Able to use THRR to govern intensity when exercising independently;Short Term: Able to use daily as guideline for intensity in rehab Short Term: Able to state/look up THRR;Long Term: Able to use THRR to govern intensity when exercising independently;Short Term: Able to use daily as guideline for intensity in rehab      Able to check pulse  independently Yes Yes      Intervention Provide education and demonstration on how to check pulse in carotid and radial arteries.;Review the importance of being able to check your own pulse for safety during independent exercise Provide education and demonstration on how to check pulse in carotid and radial arteries.;Review the importance of being able to check your own pulse for safety during independent exercise  Expected Outcomes Long Term: Able to check pulse independently and accurately;Short Term: Able to explain why pulse checking is important during independent exercise Long Term: Able to check pulse independently and accurately;Short Term: Able to explain why pulse checking is important during independent exercise      Understanding of Exercise Prescription Yes Yes      Intervention Provide education, explanation, and written materials on patient's individual exercise prescription Provide education, explanation, and written materials on patient's individual exercise prescription      Expected Outcomes Short Term: Able to explain program exercise prescription;Long Term: Able to explain home exercise prescription to exercise independently Short Term: Able to explain program exercise prescription;Long Term: Able to explain home exercise prescription to exercise independently               Exercise Goals Re-Evaluation :  Exercise Goals Re-Evaluation     Row Name 10/08/20 0819             Exercise Goal Re-Evaluation   Exercise Goals Review Increase Physical Activity;Increase Strength and Stamina;Able to understand and use rate of perceived exertion (RPE) scale;Knowledge and understanding of Target Heart Rate Range (THRR);Able to check pulse independently;Understanding of Exercise Prescription       Comments Pt has completed 5 sessions of cardiac rehab. He is slowly gaining his strength back, as he has complained of incisional discomfort and weakness in his chest muscles since his  CABG. His BP, especially his diastolic has been elevated during exercise and sometimes at rest. His DBP reached 100 while exercising during his last visit. He is unsure if he wants to do the whole program, but said that he would like to do at least six weeks. I think he would benefit from the whole program, as he is already showing progress. He currently exercises at 3.7 METs on the elliptical. Will continue to monitor and progress as able.       Expected Outcomes Through exercise at rehab and at home the patient will meet their goals.                 Discharge Exercise Prescription (Final Exercise Prescription Changes):  Exercise Prescription Changes - 10/07/20 1500       Response to Exercise   Blood Pressure (Admit) 130/86    Blood Pressure (Exercise) 180/100    Blood Pressure (Exit) 130/70    Heart Rate (Admit) 72 bpm    Heart Rate (Exercise) 103 bpm    Heart Rate (Exit) 81 bpm    Rating of Perceived Exertion (Exercise) 13    Duration Continue with 30 min of aerobic exercise without signs/symptoms of physical distress.    Intensity THRR unchanged      Progression   Progression Continue to progress workloads to maintain intensity without signs/symptoms of physical distress.      Resistance Training   Training Prescription Yes    Weight 5 lbs    Reps 10-15    Time 10 Minutes      Treadmill   MPH 2    Grade 0    Minutes 22    METs 2.53      Recumbant Elliptical   Level 1    RPM 63    Minutes 17    METs 3.7             Nutrition:  Target Goals: Understanding of nutrition guidelines, daily intake of sodium 1500mg , cholesterol 200mg , calories 30% from fat and 7% or less from saturated fats, daily  to have 5 or more servings of fruits and vegetables.  Biometrics:  Pre Biometrics - 09/26/20 1426       Pre Biometrics   Height 5\' 10"  (1.778 m)    Weight 83.4 kg    Waist Circumference 41 inches    Hip Circumference 41 inches    Waist to Hip Ratio 1 %    BMI  (Calculated) 26.38    Triceps Skinfold 11 mm    % Body Fat 26.1 %    Grip Strength 34.7 kg    Flexibility 12.5 in    Single Leg Stand 8.82 seconds              Nutrition Therapy Plan and Nutrition Goals:  Nutrition Therapy & Goals - 09/26/20 1357       Personal Nutrition Goals   Comments Patient scored 28 on his diet assessment. Score discussed and handout provided and explained regarding healthier choices and handouts on DM control. We provide 2 educational sessions regarding heart healthy nutrition with handouts and offer assistance with RD referral if patient is interested.      Intervention Plan   Intervention Nutrition handout(s) given to patient.             Nutrition Assessments:  Nutrition Assessments - 09/26/20 1357       MEDFICTS Scores   Pre Score 28            MEDIFICTS Score Key: ?70 Need to make dietary changes  40-70 Heart Healthy Diet ? 40 Therapeutic Level Cholesterol Diet   Picture Your Plate Scores: <09 Unhealthy dietary pattern with much room for improvement. 41-50 Dietary pattern unlikely to meet recommendations for good health and room for improvement. 51-60 More healthful dietary pattern, with some room for improvement.  >60 Healthy dietary pattern, although there may be some specific behaviors that could be improved.    Nutrition Goals Re-Evaluation:   Nutrition Goals Discharge (Final Nutrition Goals Re-Evaluation):   Psychosocial: Target Goals: Acknowledge presence or absence of significant depression and/or stress, maximize coping skills, provide positive support system. Participant is able to verbalize types and ability to use techniques and skills needed for reducing stress and depression.  Initial Review & Psychosocial Screening:  Initial Psych Review & Screening - 09/26/20 1401       Initial Review   Current issues with None Identified      Family Dynamics   Good Support System? Yes      Barriers   Psychosocial  barriers to participate in program There are no identifiable barriers or psychosocial needs.      Screening Interventions   Interventions Encouraged to exercise;Other (comment)    Expected Outcomes Short Term goal: Identification and review with participant of any Quality of Life or Depression concerns found by scoring the questionnaire.             Quality of Life Scores:  Quality of Life - 09/26/20 1422       Quality of Life   Select Quality of Life      Quality of Life Scores   Health/Function Pre 22.25 %    Socioeconomic Pre 27 %    Psych/Spiritual Pre 26.57 %    Family Pre 27.6 %    GLOBAL Pre 24.98 %            Scores of 19 and below usually indicate a poorer quality of life in these areas.  A difference of  2-3 points is a clinically meaningful  difference.  A difference of 2-3 points in the total score of the Quality of Life Index has been associated with significant improvement in overall quality of life, self-image, physical symptoms, and general health in studies assessing change in quality of life.  PHQ-9: Recent Review Flowsheet Data     Depression screen [facility] 2/9 09/26/2020 03/01/2020 12/02/2018   Decreased Interest 0 0 0   Down, Depressed, Hopeless 0 0 0   PHQ - 2 Score 0 0 0   Altered sleeping 2 1 -   Tired, decreased energy 3 1 -   Change in appetite 0 1 -   Feeling bad or failure about yourself  0 0 -   Trouble concentrating 0 0 -   Moving slowly or fidgety/restless 0 0 -   Suicidal thoughts 0 0 -   PHQ-9 Score 5 3 -   Difficult doing work/chores Not difficult at all Not difficult at all -      Interpretation of Total Score  Total Score Depression Severity:  1-4 = Minimal depression, 5-9 = Mild depression, 10-14 = Moderate depression, 15-19 = Moderately severe depression, 20-27 = Severe depression   Psychosocial Evaluation and Intervention:  Psychosocial Evaluation - 09/26/20 1402       Psychosocial Evaluation & Interventions   Interventions  Stress management education;Relaxation education;Encouraged to exercise with the program and follow exercise prescription    Comments Patient has no psychosocial barrers to participate in CR at his orientation visit. He denies any depression or anxiety. His initial PHQ-9 score was 5 related to physical symptoms since his CABG. He names his wife as his support system. He has a positive outlook and is ready to get stronger and be able to return to work. He demonstrates an interest to improving his health.    Expected Outcomes Patient will continue to have no psychosocial issues identified.    Continue Psychosocial Services  No Follow up required             Psychosocial Re-Evaluation:  Psychosocial Re-Evaluation     [place] Name 09/30/20 1456             Psychosocial Re-Evaluation   Current issues with None Identified       Comments Patient is new to the program starting today. He continues to have no psychosocial barrers identified. We will continue to montior.       Expected Outcomes Patient will continue to have no psychosocial issues identified.       Interventions Stress management education;Relaxation education;Encouraged to attend Cardiac Rehabilitation for the exercise       Continue Psychosocial Services  No Follow up required                Psychosocial Discharge (Final Psychosocial Re-Evaluation):  Psychosocial Re-Evaluation - 09/30/20 1456       Psychosocial Re-Evaluation   Current issues with None Identified    Comments Patient is new to the program starting today. He continues to have no psychosocial barrers identified. We will continue to montior.    Expected Outcomes Patient will continue to have no psychosocial issues identified.    Interventions Stress management education;Relaxation education;Encouraged to attend Cardiac Rehabilitation for the exercise    Continue Psychosocial Services  No Follow up required             Vocational  Rehabilitation: Provide vocational rehab assistance to qualifying candidates.   Vocational Rehab Evaluation & Intervention:  Vocational Rehab - 09/26/20 1356  Initial Vocational Rehab Evaluation & Intervention   Assessment shows need for Vocational Rehabilitation No      Vocational Rehab Re-Evaulation   Comments Patient has a job and plans to return to work after he completes the program. He does not need vocational rehab.             Education: Education Goals: Education classes will be provided on a weekly basis, covering required topics. Participant will state understanding/return demonstration of topics presented.  Learning Barriers/Preferences:  Learning Barriers/Preferences - 09/26/20 1359       Learning Barriers/Preferences   Learning Barriers None    Learning Preferences Audio;Video;Written Material             Education Topics: Hypertension, Hypertension Reduction -Define heart disease and high blood pressure. Discus how high blood pressure affects the body and ways to reduce high blood pressure.   Exercise and Your Heart -Discuss why it is important to exercise, the FITT principles of exercise, normal and abnormal responses to exercise, and how to exercise safely.   Angina -Discuss definition of angina, causes of angina, treatment of angina, and how to decrease risk of having angina.   Cardiac Medications -Review what the following cardiac medications are used for, how they affect the body, and side effects that may occur when taking the medications.  Medications include Aspirin, Beta blockers, calcium channel blockers, ACE Inhibitors, angiotensin receptor blockers, diuretics, digoxin, and antihyperlipidemics.   Congestive Heart Failure -Discuss the definition of CHF, how to live with CHF, the signs and symptoms of CHF, and how keep track of weight and sodium intake.   Heart Disease and Intimacy -Discus the effect sexual activity has on the  heart, how changes occur during intimacy as we age, and safety during sexual activity.   Smoking Cessation / COPD -Discuss different methods to quit smoking, the health benefits of quitting smoking, and the definition of COPD.   Nutrition I: Fats -Discuss the types of cholesterol, what cholesterol does to the heart, and how cholesterol levels can be controlled.   Nutrition II: Labels -Discuss the different components of food labels and how to read food label   Heart Parts/Heart Disease and PAD -Discuss the anatomy of the heart, the pathway of blood circulation through the heart, and these are affected by heart disease.   Stress I: Signs and Symptoms -Discuss the causes of stress, how stress may lead to anxiety and depression, and ways to limit stress. Flowsheet Row CARDIAC REHAB PHASE II EXERCISE from 10/02/2020 in [place]  Date 10/02/20  Educator pb  Instruction Review Code 1- Verbalizes Understanding       Stress II: Relaxation -Discuss different types of relaxation techniques to limit stress.   Warning Signs of Stroke / TIA -Discuss definition of a stroke, what the signs and symptoms are of a stroke, and how to identify when someone is having stroke.   Knowledge Questionnaire Score:  Knowledge Questionnaire Score - 09/26/20 1357       Knowledge Questionnaire Score   Pre Score 22/24             Core Components/Risk Factors/Patient Goals at Admission:  Personal Goals and Risk Factors at Admission - 09/26/20 1359       Core Components/Risk Factors/Patient Goals on Admission    Weight Management Weight Maintenance    Diabetes Yes    Intervention Provide education about signs/symptoms and action to take for hypo/hyperglycemia.;Provide education about proper nutrition, including hydration, and aerobic/resistive exercise  prescription along with prescribed medications to achieve blood glucose in normal ranges: Fasting glucose 65-99 mg/dL     Expected Outcomes Short Term: Participant verbalizes understanding of the signs/symptoms and immediate care of hyper/hypoglycemia, proper foot care and importance of medication, aerobic/resistive exercise and nutrition plan for blood glucose control.;Long Term: Attainment of HbA1C < 7%.    Personal Goal Other Yes    Personal Goal Get stronger; improve lung function; be able to return to work.    Intervention Patient will attend CR 3 days/week and supplement with exersice at  home 2 days/week.    Expected Outcomes Patient will complete the program meeting both program and personal goals.             Core Components/Risk Factors/Patient Goals Review:   Goals and Risk Factor Review     Row Name 09/30/20 1457             Core Components/Risk Factors/Patient Goals Review   Personal Goals Review Weight Management/Obesity;Diabetes       Review Patient is new to the program starting today. He was referred to CR with [facility]. He has multiple risk factors for CAD and is participating in the program for risk modification. His personal goals for the program are to get stronger; improve his lung function; and get back to work. We will continue to monitor his progress as he works towards meeting these goals.       Expected Outcomes Patient will complete the program meeting both personal and program goals.                Core Components/Risk Factors/Patient Goals at Discharge (Final Review):   Goals and Risk Factor Review - 09/30/20 1457       Core Components/Risk Factors/Patient Goals Review   Personal Goals Review Weight Management/Obesity;Diabetes    Review Patient is new to the program starting today. He was referred to CR with [facility]. He has multiple risk factors for CAD and is participating in the program for risk modification. His personal goals for the program are to get stronger; improve his lung function; and get back to work. We will continue to monitor his progress as he works  towards meeting these goals.    Expected Outcomes Patient will complete the program meeting both personal and program goals.             ITP Comments:   Comments: ITP REVIEW Pt is making expected progress toward Cardiac Rehab goals after completing 5 sessions. Recommend continued exercise, life style modification, education, and increased stamina and strength.

## 2020-10-11 ENCOUNTER — Other Ambulatory Visit: Payer: Self-pay

## 2020-10-11 ENCOUNTER — Encounter (HOSPITAL_COMMUNITY)
Admission: RE | Admit: 2020-10-11 | Discharge: 2020-10-11 | Disposition: A | Discharge status: Home / Self Care | Payer: BC Managed Care – PPO | Source: Ambulatory Visit | Attending: Surgery | Admitting: Surgery

## 2020-10-11 DIAGNOSIS — Z951 Presence of aortocoronary bypass graft: Secondary | ICD-10-CM

## 2020-10-11 NOTE — Progress Notes (Addendum)
Daily Session Note  Patient Details  Name: Daniel Carey MRN: [id number] Date of Birth: 06-Feb-1951 Referring Provider:   Flowsheet Row CARDIAC REHAB PHASE II ORIENTATION from 09/26/2020 in [place]  Referring Provider Dr. Acie Fredrickson       Encounter Date: 10/11/2020  Check In:  Session Check In - 10/11/20 1445       Check-In   Supervising physician immediately available to respond to emergencies [facility] MD immediately available    Physician(s) Dr. Harrington Challenger    Location [facility]    Staff Present Aundra Dubin, RN, Bjorn Loser, MS, ACSM-CEP, Exercise Physiologist    Virtual Visit No    Medication changes reported     No    Fall or balance concerns reported    No    Tobacco Cessation No Change    Warm-up and Cool-down Performed as group-led instruction    Resistance Training Performed Yes    VAD Patient? No    PAD/SET Patient? No      Pain Assessment   Currently in Pain? No/denies    Pain Score 0-No pain    Multiple Pain Sites No             Capillary Blood Glucose: No results found for this or any previous visit (from the past 24 hour(s)).    Social History   Tobacco Use  Smoking Status Former   Packs/day: 0.50   Years: 40.00   Pack years: 20.00   Types: Cigarettes   Quit date: 08/25/2011   Years since quitting: 9.1  Smokeless Tobacco Never    Goals Met:  Independence with exercise equipment Exercise tolerated well No report of cardiac concerns or symptoms Strength training completed today  Goals Unmet:  Not Applicable  Comments: checkout time is 1545   Dr. Kathie Dike is Medical Director for [facility].

## 2020-10-14 ENCOUNTER — Other Ambulatory Visit: Payer: Self-pay

## 2020-10-14 ENCOUNTER — Encounter (HOSPITAL_COMMUNITY)
Admission: RE | Admit: 2020-10-14 | Discharge: 2020-10-14 | Disposition: A | Discharge status: Home / Self Care | Payer: BC Managed Care – PPO | Source: Ambulatory Visit | Attending: Surgery | Admitting: Surgery

## 2020-10-14 DIAGNOSIS — Z951 Presence of aortocoronary bypass graft: Secondary | ICD-10-CM

## 2020-10-14 NOTE — Progress Notes (Signed)
Daily Session Note  Patient Details  Name: Daniel Carey MRN: [id number] Date of Birth: [birth year] Referring Provider:   Flowsheet Row CARDIAC REHAB PHASE II ORIENTATION from 09/26/2020 in [place]  Referring Provider Dr. Acie Fredrickson       Encounter Date: 10/14/2020  Check In:  Session Check In - 10/14/20 1445       Check-In   Supervising physician immediately available to respond to emergencies [facility] MD immediately available    Physician(s) Dr. Harrington Challenger    Location [facility]    Staff Present Geanie Cooley, RN;Debra Wynetta Emery, RN, BSN    Virtual Visit No    Medication changes reported     No    Fall or balance concerns reported    No    Tobacco Cessation No Change    Warm-up and Cool-down Performed as group-led instruction    Resistance Training Performed Yes    VAD Patient? No    PAD/SET Patient? No      Pain Assessment   Currently in Pain? No/denies    Pain Score 0-No pain    Multiple Pain Sites No             Capillary Blood Glucose: No results found for this or any previous visit (from the past 24 hour(s)).    Social History   Tobacco Use  Smoking Status Former   Packs/day: 0.50   Years: 40.00   Pack years: 20.00   Types: Cigarettes   Quit date: 08/25/2011   Years since quitting: 9.1  Smokeless Tobacco Never    Goals Met:  Independence with exercise equipment Exercise tolerated well No report of cardiac concerns or symptoms Strength training completed today  Goals Unmet:  Not Applicable  Comments: check out @ 3:30   Dr. Kathie Dike is Medical Director for [facility].

## 2020-10-15 ENCOUNTER — Other Ambulatory Visit: Payer: Self-pay | Admitting: Family Medicine

## 2020-10-15 NOTE — Progress Notes (Signed)
Remote pacemaker transmission.   

## 2020-10-16 ENCOUNTER — Other Ambulatory Visit: Payer: Self-pay

## 2020-10-16 ENCOUNTER — Encounter (HOSPITAL_COMMUNITY)
Admission: RE | Admit: 2020-10-16 | Discharge: 2020-10-16 | Disposition: A | Discharge status: Home / Self Care | Payer: BC Managed Care – PPO | Source: Ambulatory Visit | Attending: Surgery | Admitting: Surgery

## 2020-10-16 DIAGNOSIS — Z951 Presence of aortocoronary bypass graft: Secondary | ICD-10-CM | POA: Diagnosis not present

## 2020-10-16 NOTE — Progress Notes (Signed)
Daily Session Note  Patient Details  Name: Daniel Carey MRN: [id number] Date of Birth: 1951-02-26 Referring Provider:   Flowsheet Row CARDIAC REHAB PHASE II ORIENTATION from 09/26/2020 in [place]  Referring Provider Dr. Acie Fredrickson       Encounter Date: 10/16/2020  Check In:  Session Check In - 10/16/20 1445       Check-In   Physician(s) Dr. Domenic Polite    Location [facility]    Staff Present Aundra Dubin, RN, Bjorn Loser, MS, ACSM-CEP, Exercise Physiologist    Virtual Visit No    Medication changes reported     No    Fall or balance concerns reported    No    Tobacco Cessation No Change    Warm-up and Cool-down Performed as group-led instruction    Resistance Training Performed Yes    VAD Patient? No    PAD/SET Patient? No      Pain Assessment   Currently in Pain? No/denies    Pain Score 0-No pain    Multiple Pain Sites No             Capillary Blood Glucose: No results found for this or any previous visit (from the past 24 hour(s)).    Social History   Tobacco Use  Smoking Status Former   Packs/day: 0.50   Years: 40.00   Pack years: 20.00   Types: Cigarettes   Quit date: 08/25/2011   Years since quitting: 9.1  Smokeless Tobacco Never    Goals Met:  Independence with exercise equipment Exercise tolerated well No report of cardiac concerns or symptoms Strength training completed today  Goals Unmet:  Not Applicable  Comments: Check out 1645.   Dr. Kathie Dike is Medical Director for [facility].

## 2020-10-18 ENCOUNTER — Encounter (HOSPITAL_COMMUNITY)
Admission: RE | Admit: 2020-10-18 | Discharge: 2020-10-18 | Disposition: A | Discharge status: Home / Self Care | Payer: BC Managed Care – PPO | Source: Ambulatory Visit | Attending: Surgery | Admitting: Surgery

## 2020-10-18 ENCOUNTER — Other Ambulatory Visit: Payer: Self-pay

## 2020-10-18 DIAGNOSIS — Z951 Presence of aortocoronary bypass graft: Secondary | ICD-10-CM

## 2020-10-18 IMAGING — CR DG LUMBAR SPINE 2-3V
3 series · 3 of 3 positions shown · non-contrast
Comparison: 05/10/2018

CLINICAL DATA: Lumbar spine surgery, L2-L3 micro lumbar
decompression, possible L1-L2

EXAM:
LUMBAR SPINE - 2-3 VIEW

[lateral (1 of 3)]
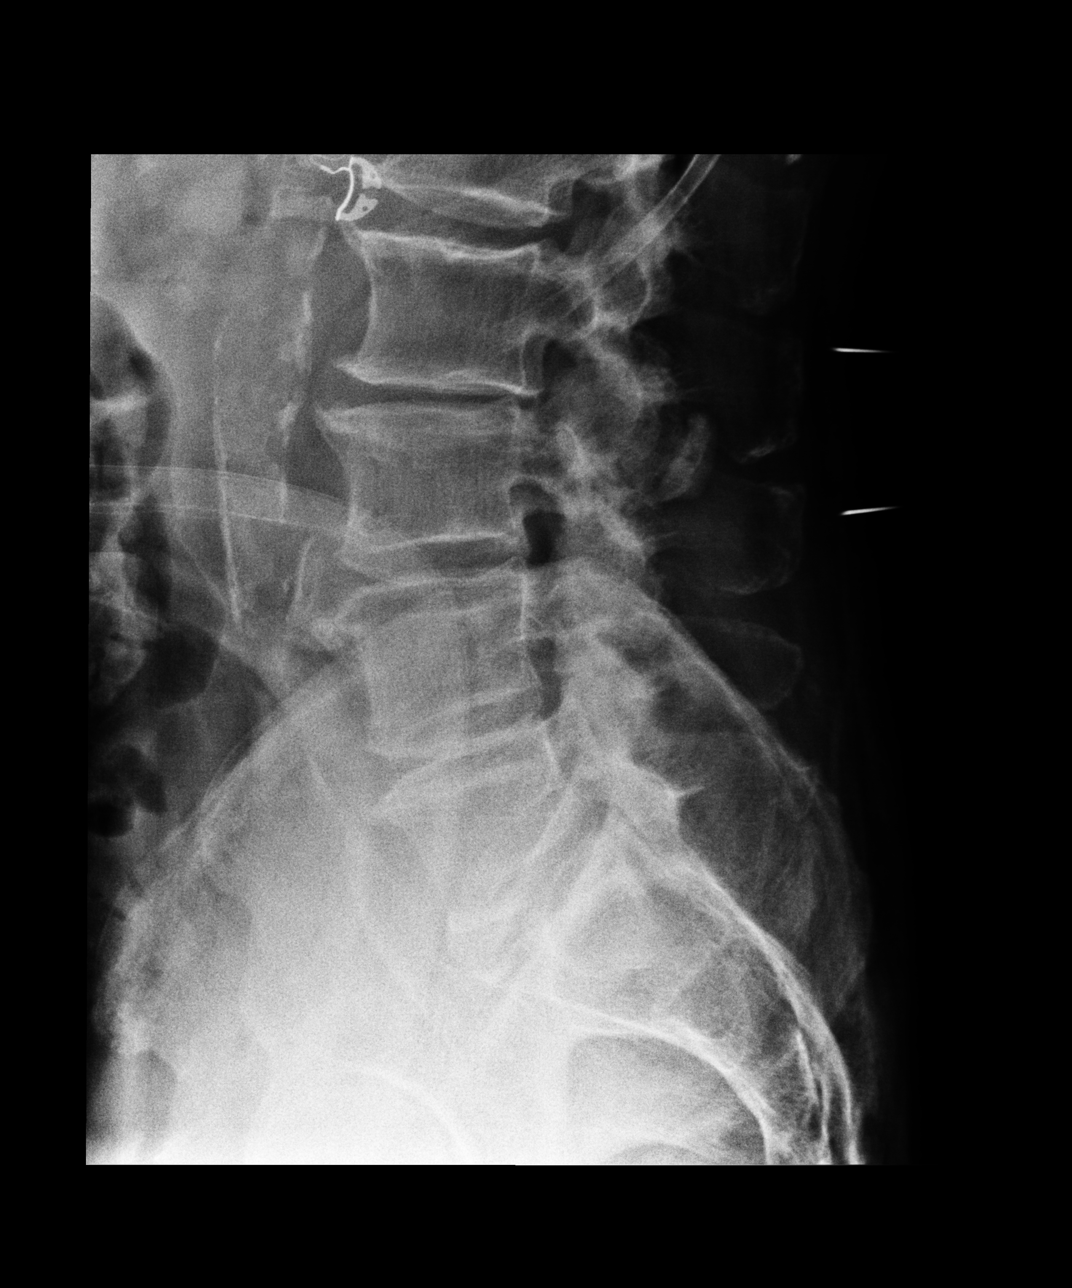

[lateral (2 of 3)]
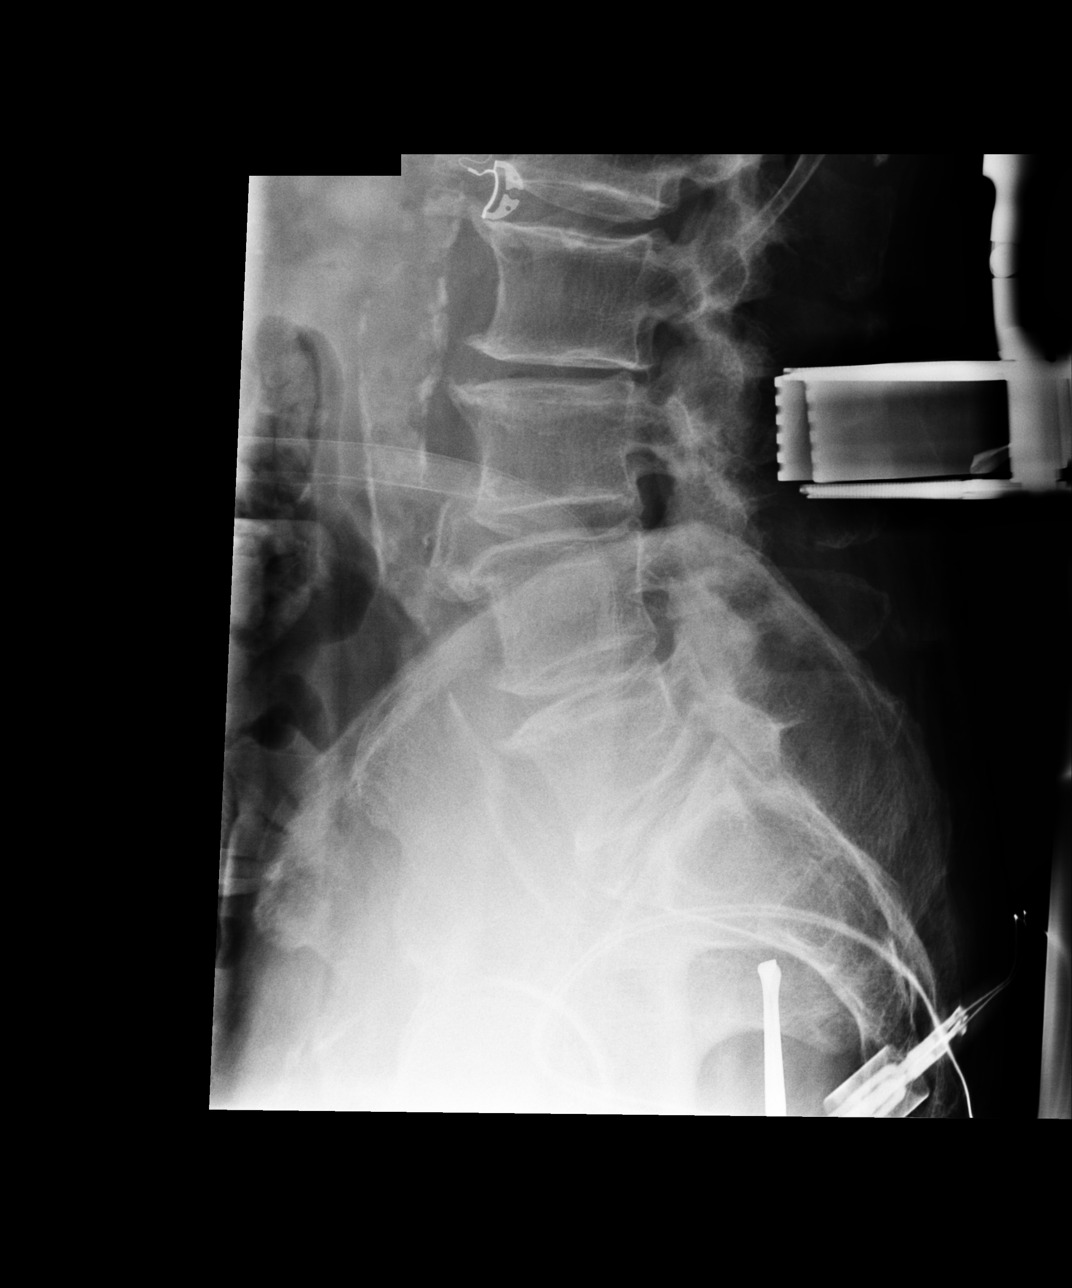

[lateral (3 of 3)]
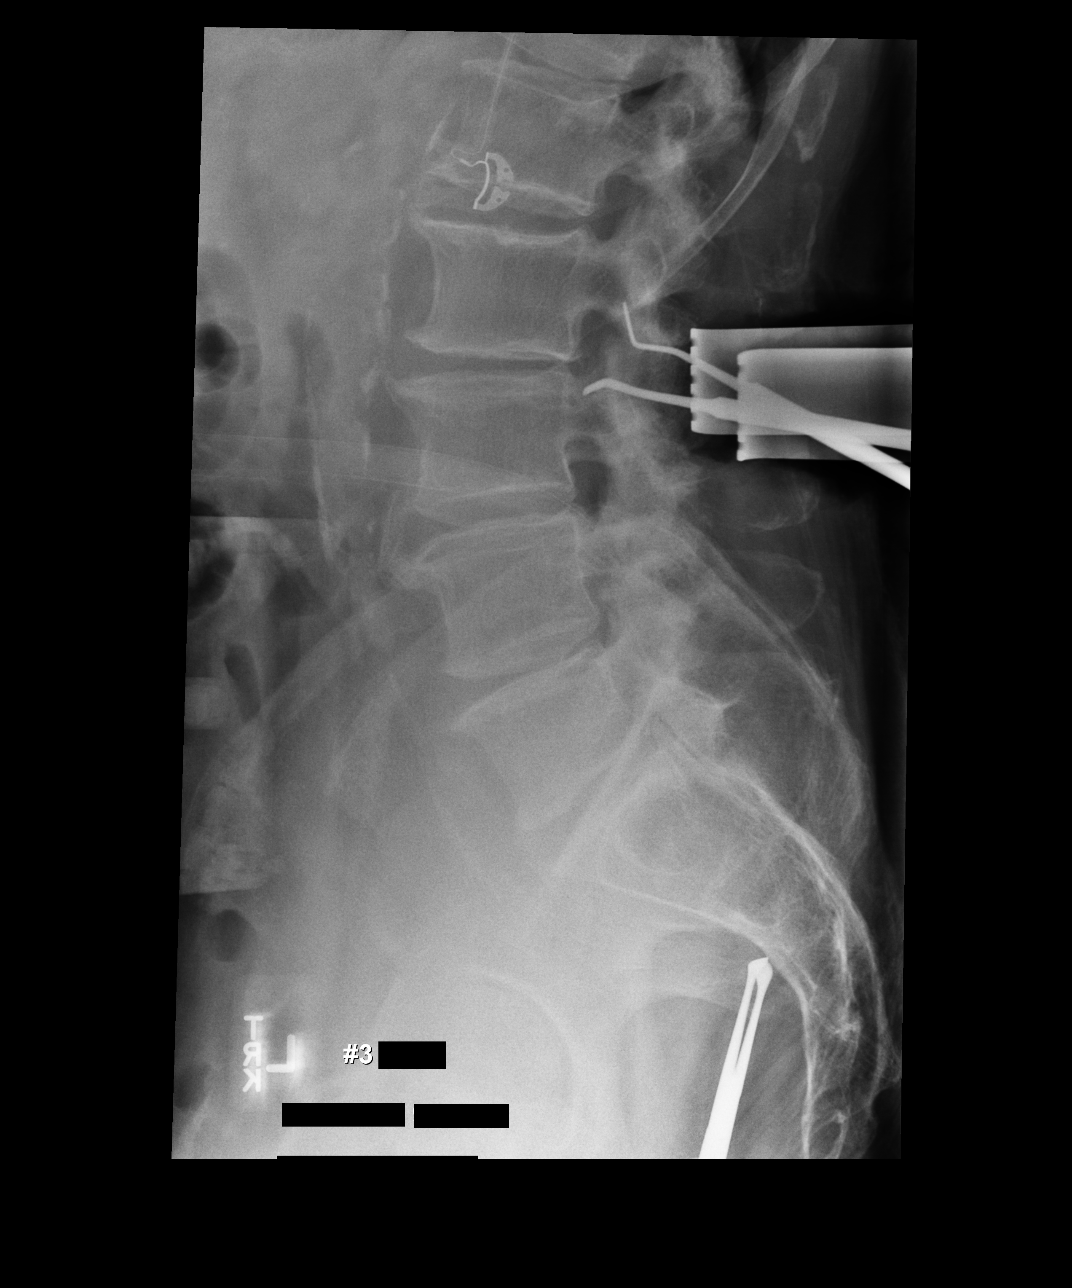

[3 of 3 positions shown; findings below may reference images not displayed]

FINDINGS: Five lumbar vertebra labeled on previous exam.

Image #1 at 7559 hours: Metallic probes via dorsal approach project
dorsal to the mid L2 and mid L3 levels. Bones appear demineralized
with mild disc space narrowing and endplate spur formation
throughout lumbar spine.

Image #2 at 2720 hours: Tissue spreader and surgical instruments
present dorsal to L3 vertebra.

Image #3 at 7686 hours: 2 crossed bent metallic probes are
identified, projecting dorsal to the inferior L2 and superior L3
levels.
IMPRESSION: Intraoperative lumbar localization images as above.

## 2020-10-18 NOTE — Progress Notes (Signed)
Daily Session Note  Patient Details  Name: Daniel Carey MRN: [id number] Date of Birth: 28-Sep-1950 Referring Provider:   Flowsheet Row CARDIAC REHAB PHASE II ORIENTATION from 09/26/2020 in [place]  Referring Provider Dr. Acie Fredrickson       Encounter Date: 10/18/2020  Check In:  Session Check In - 10/18/20 1445       Check-In   Supervising physician immediately available to respond to emergencies [facility] MD immediately available    Physician(s) Dr. Domenic Polite    Location [facility]    Staff Present Geanie Cooley, RN;D[place]ton Fletcher, MS, ACSM-CEP, Exercise Physiologist    Virtual Visit No    Medication changes reported     No    Fall or balance concerns reported    No    Tobacco Cessation No Change    Warm-up and Cool-down Performed as group-led instruction    Resistance Training Performed Yes    VAD Patient? No    PAD/SET Patient? No      Pain Assessment   Currently in Pain? No/denies    Pain Score 0-No pain    Multiple Pain Sites No             Capillary Blood Glucose: No results found for this or any previous visit (from the past 24 hour(s)).    Social History   Tobacco Use  Smoking Status Former   Packs/day: 0.50   Years: 40.00   Pack years: 20.00   Types: Cigarettes   Quit date: 08/25/2011   Years since quitting: 9.1  Smokeless Tobacco Never    Goals Met:  Independence with exercise equipment Exercise tolerated well No report of cardiac concerns or symptoms Strength training completed today  Goals Unmet:  Not Applicable  Comments: check out @ 3:45pm   Dr. Kathie Dike is Medical Director for [facility] Re[facility].

## 2020-10-21 ENCOUNTER — Encounter (HOSPITAL_COMMUNITY): Payer: BC Managed Care – PPO

## 2020-10-23 ENCOUNTER — Other Ambulatory Visit: Payer: Self-pay

## 2020-10-23 ENCOUNTER — Encounter (HOSPITAL_COMMUNITY)
Admission: RE | Admit: 2020-10-23 | Discharge: 2020-10-23 | Disposition: A | Discharge status: Home / Self Care | Payer: BC Managed Care – PPO | Source: Ambulatory Visit | Attending: Surgery | Admitting: Surgery

## 2020-10-23 VITALS — Wt 184.5 lb

## 2020-10-23 DIAGNOSIS — Z951 Presence of aortocoronary bypass graft: Secondary | ICD-10-CM | POA: Diagnosis not present

## 2020-10-23 NOTE — Progress Notes (Signed)
Daily Session Note  Patient Details  Name: Daniel Carey MRN: [id number] Date of Birth: Jun 06, 1950 Referring Provider:   Flowsheet Row CARDIAC REHAB PHASE II ORIENTATION from 09/26/2020 in [place]  Referring Provider Dr. Acie Fredrickson       Encounter Date: 10/23/2020  Check In:  Session Check In - 10/23/20 1445       Check-In   Supervising physician immediately available to respond to emergencies [facility] immediately available    Physician(s) Dr. Johnsie Cancel    Location [facility]    Staff Present Geanie Cooley, Kermit Balo, RN, BSN    Virtual Visit No    Medication changes reported     No    Fall or balance concerns reported    No    Tobacco Cessation No Change    Warm-up and Cool-down Performed as group-led instruction    Resistance Training Performed Yes    VAD Patient? No    PAD/SET Patient? No      Pain Assessment   Currently in Pain? No/denies    Pain Score 0-No pain    Multiple Pain Sites No             Capillary Blood Glucose: No results found for this or any previous visit (from the past 24 hour(s)).    Social History   Tobacco Use  Smoking Status Former   Packs/day: 0.50   Years: 40.00   Pack years: 20.00   Types: Cigarettes   Quit date: 08/25/2011   Years since quitting: 9.1  Smokeless Tobacco Never    Goals Met:  Independence with exercise equipment Exercise tolerated well No report of cardiac concerns or symptoms Strength training completed today  Goals Unmet:  Not Applicable  Comments: check out @ 3:45   Dr. Kathie Dike is Medical Director for [facility] Re[facility].

## 2020-10-25 ENCOUNTER — Encounter (HOSPITAL_COMMUNITY)
Admission: RE | Admit: 2020-10-25 | Discharge: 2020-10-25 | Disposition: A | Discharge status: Home / Self Care | Payer: BC Managed Care – PPO | Source: Ambulatory Visit | Attending: Surgery | Admitting: Surgery

## 2020-10-25 ENCOUNTER — Other Ambulatory Visit: Payer: Self-pay

## 2020-10-25 DIAGNOSIS — Z951 Presence of aortocoronary bypass graft: Secondary | ICD-10-CM

## 2020-10-25 NOTE — Progress Notes (Signed)
Daily Session Note  Patient Details  Name: Daniel Carey MRN: [id number] Date of Birth: 09/28/1950 Referring Provider:   Flowsheet Row CARDIAC REHAB PHASE II ORIENTATION from 09/26/2020 in [place]  Referring Provider Dr. Acie Fredrickson       Encounter Date: 10/25/2020  Check In:  Session Check In - 10/25/20 1445       Check-In   Supervising physician immediately available to respond to emergencies [facility] immediately available    Physician(s) Dr. Harrington Challenger    Location [facility]    Staff Present Geanie Cooley, RN;Debra Wynetta Emery, RN, BSN    Virtual Visit No    Medication changes reported     No    Fall or balance concerns reported    No    Tobacco Cessation No Change    Warm-up and Cool-down Performed as group-led instruction    Resistance Training Performed Yes    VAD Patient? No    PAD/SET Patient? No      Pain Assessment   Currently in Pain? No/denies    Pain Score 0-No pain    Multiple Pain Sites No             Capillary Blood Glucose: No results found for this or any previous visit (from the past 24 hour(s)).    Social History   Tobacco Use  Smoking Status Former   Packs/day: 0.50   Years: 40.00   Pack years: 20.00   Types: Cigarettes   Quit date: 08/25/2011   Years since quitting: 9.1  Smokeless Tobacco Never    Goals Met:  Independence with exercise equipment Exercise tolerated well No report of cardiac concerns or symptoms Strength training completed today  Goals Unmet:  Not Applicable  Comments: check out @ 3:45pm   Dr. Kathie Dike is Medical Director for [facility] Pul[facility] Re[facility].

## 2020-10-28 ENCOUNTER — Encounter (HOSPITAL_COMMUNITY): Payer: BC Managed Care – PPO

## 2020-10-30 ENCOUNTER — Other Ambulatory Visit: Payer: Self-pay

## 2020-10-30 ENCOUNTER — Encounter (HOSPITAL_COMMUNITY)
Admission: RE | Admit: 2020-10-30 | Discharge: 2020-10-30 | Disposition: A | Discharge status: Home / Self Care | Payer: BC Managed Care – PPO | Source: Ambulatory Visit | Attending: Surgery | Admitting: Surgery

## 2020-10-30 DIAGNOSIS — Z951 Presence of aortocoronary bypass graft: Secondary | ICD-10-CM

## 2020-10-30 NOTE — Progress Notes (Signed)
Daily Session Note  Patient Details  Name: Daniel Carey MRN: [id number] Date of Birth: [birth year] Referring Provider:   Flowsheet Row CARDIAC REHAB PHASE II ORIENTATION from 09/26/2020 in [place]  Referring Provider Dr. Acie Fredrickson       Encounter Date: 10/30/2020  Check In:  Session Check In - 10/30/20 1445       Check-In   Supervising physician immediately available to respond to emergencies [facility] MD immediately available    Physician(s) Dr. Harl Bowie    Location [facility]    Staff Present Geanie Cooley, RN;Debra Wynetta Emery, RN, Bjorn Loser, MS, ACSM-CEP, Exercise Physiologist    Virtual Visit No    Medication changes reported     No    Fall or balance concerns reported    No    Tobacco Cessation No Change    Warm-up and Cool-down Performed as group-led instruction    Resistance Training Performed Yes    VAD Patient? No    PAD/SET Patient? No      Pain Assessment   Currently in Pain? No/denies    Pain Score 0-No pain    Multiple Pain Sites No             Capillary Blood Glucose: No results found for this or any previous visit (from the past 24 hour(s)).    Social History   Tobacco Use  Smoking Status Former   Packs/day: 0.50   Years: 40.00   Pack years: 20.00   Types: Cigarettes   Quit date: 08/25/2011   Years since quitting: 9.1  Smokeless Tobacco Never    Goals Met:  Independence with exercise equipment Exercise tolerated well No report of cardiac concerns or symptoms Strength training completed today  Goals Unmet:  Not Applicable  Comments: check out @ 3:45pm   Dr. Kathie Dike is Medical Director for [facility] Pulmonary Rehab.

## 2020-11-01 ENCOUNTER — Encounter: Payer: Self-pay | Admitting: Internal Medicine

## 2020-11-01 ENCOUNTER — Ambulatory Visit (INDEPENDENT_AMBULATORY_CARE_PROVIDER_SITE_OTHER): Payer: BC Managed Care – PPO | Admitting: Internal Medicine

## 2020-11-01 ENCOUNTER — Other Ambulatory Visit: Payer: Self-pay

## 2020-11-01 ENCOUNTER — Encounter (HOSPITAL_COMMUNITY)
Admission: RE | Admit: 2020-11-01 | Discharge: 2020-11-01 | Disposition: A | Discharge status: Home / Self Care | Payer: BC Managed Care – PPO | Source: Ambulatory Visit | Attending: Surgery | Admitting: Surgery

## 2020-11-01 VITALS — BP 128/88 | HR 68 | Ht 70.0 in | Wt 183.8 lb

## 2020-11-01 DIAGNOSIS — E1165 Type 2 diabetes mellitus with hyperglycemia: Secondary | ICD-10-CM | POA: Diagnosis not present

## 2020-11-01 DIAGNOSIS — E039 Hypothyroidism, unspecified: Secondary | ICD-10-CM | POA: Diagnosis not present

## 2020-11-01 DIAGNOSIS — Z951 Presence of aortocoronary bypass graft: Secondary | ICD-10-CM

## 2020-11-01 DIAGNOSIS — E663 Overweight: Secondary | ICD-10-CM | POA: Diagnosis not present

## 2020-11-01 LAB — POCT GLYCOSYLATED HEMOGLOBIN (HGB A1C): Hemoglobin A1C: 6.2 % — AB (ref 4.0–5.6)

## 2020-11-01 MED ORDER — METFORMIN HCL ER 500 MG PO TB24: 1000.0000 mg | ORAL_TABLET | Freq: Every day | ORAL | 3 refills | Status: DC

## 2020-11-01 NOTE — Progress Notes (Signed)
Patient ID: Daniel Carey, male   DOB: 1950-06-02, 70 y.o.   MRN: [id number]  This visit occurred during the SARS-CoV-2 public health emergency.  Safety protocols were in place, including screening questions prior to the visit, additional usage of staff PPE, and extensive cleaning of exam room while observing appropriate contact time as indicated for disinfecting solutions.   HPI  Daniel Carey is a 70 y.o.-year-old male, returning for f/u for hypothyroidism, dx 2012 and DM2, diet-controlled, non-insulin-dependent, with complications (CAD - s/p CABG 07/2020).  Last visit 3 months ago.  Interim history: Around the time of his CABG on 07/19/2020 sugars were increased and he was started on metformin in the hospital.  We continued this on discharge.  Since last visit, he started cardiac rehab. He usually tolerates the exercise well, but felt SOB and wheezing this am. No increased urination, blurry vision, chest pain. Has some nausea, no diarrhea.  He tells me he stopped metformin 2 weeks ago due to nausea.  Nausea improved some, but did not disappear.  Pt. has been dx with hypothyroidism in 2012 >> he was on levothyroxine up to 200 mcg in 2016 >> but did not feel well so he stopped.  Afterwards, his TFTs fluctuated but in the last 3 years they have been in the normal range.  Reviewed previous TFTs: Lab Results  Component Value Date   TSH 3.96 03/20/2020   TSH 3.83 08/22/2019   TSH 3.99 01/31/2018   TSH 4.50 08/02/2017   TSH 4.89 (H) 12/08/2016   TSH 4.13 03/06/2016   TSH 9.84 (H) 09/05/2015   TSH 0.23 (L) 05/07/2015   TSH 2.49 08/02/2014   TSH 4.695 (H) 10/26/2013   FREET4 0.75 03/20/2020   FREET4 0.80 01/31/2018   FREET4 0.65 08/02/2017   FREET4 0.83 12/08/2016   FREET4 0.67 03/06/2016   FREET4 0.66 09/05/2015   FREET4 1.25 05/07/2015   FREET4 0.79 08/02/2014    Lab Results  Component Value Date   T3FREE 3.0 03/20/2020   T3FREE 2.8 01/31/2018   T3FREE 2.6 08/02/2017   T3FREE  3.3 12/08/2016   T3FREE 3.1 03/06/2016   T3FREE 3.2 09/05/2015   T3FREE 3.9 05/07/2015   T3FREE 2.9 08/02/2014   T3FREE 2.8 07/20/2011   Pt denies: - feeling nodules in neck - hoarseness - dysphagia - choking - SOB with lying down   DM2:  Reviewed HbA1c levels: Lab Results  Component Value Date   HGBA1C 7.3 (H) 07/18/2020   HGBA1C 6.5 (A) 03/20/2020   HGBA1C 7.5 (H) 08/22/2019  07/03/2016: HbA1c 6.8%  He was on: - Metformin 500 mg 2x a day >> stopped 2/2 nausea  He continues to limit sweets and to not eat fried foods.  He checks his sugars 3x a day: - am: 95-168 >> 140, 159, 179 >> 120-140 - 2h after b'fast: n/c - lunch: 136 >> 135, 140-160 >> 80-150 - 2h after lunch: 100 >> n/c - dinner: 120, 121 >> 150-174 >> 90-140 - 2h after dinner: n/c - bedtime: n/c Feels sugars <80.  Meter: ReliOn >> ReliOn Premier Blu  + CKD. Last BUN/Cr: Lab Results  Component Value Date   BUN 22 07/23/2020   BUN 21 07/22/2020   BUN 21 07/21/2020   CREATININE 1.23 07/23/2020   CREATININE 1.21 07/22/2020   CREATININE 1.25 (H) 07/21/2020  He is off ACE inhibitor/ARB due to history of high potassium.  Latest potassium levels have been normal: Lab Results  Component Value Date   K  4.3 07/23/2020   K 4.0 07/22/2020   K 4.3 07/21/2020   K 4.2 07/20/2020   K 4.5 07/20/2020   + HL: Lab Results  Component Value Date   CHOL 146 07/05/2020   HDL 46 07/05/2020   LDLCALC 71 07/05/2020   LDLDIRECT 107.0 02/27/2013   TRIG 174 (H) 07/05/2020   CHOLHDL 3.2 07/05/2020  On Crestor 20, Niacin, and fenofibrate.  Last eye exam in  2021: reportedly; No DR, no glaucoma, + incipient cataracts.  He sees Dr Acie Fredrickson >> had angioplasy x3 in the past, and 2 stents placed. He he has a history of an AMI 75 (mild). He has a Mobitz 1 AVB. Had a catheterization in 10/2016 >> partial blockages. He has OSA and wears a CPAP He had a colonoscopy 02/2017 >> E coli sepsis. He stays active playing golf,  but not since his CABG.  ROS: + See HPI  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    noted on EKG March 2013   CAD (coronary artery disease)    s/p atherectomy, PTCA/DES x2 mid & distal RCA 06/30/12   Carotid artery occlusion    Chest pain    Complication of anesthesia    heart rate dropping with propofol last procedure   COPD (chronic obstructive pulmonary disease) ([place])    Dyspnea    ED (erectile dysfunction)    Esophageal stricture    External hemorrhoids without mention of complication    GERD (gastroesophageal reflux disease)    Headache(784.0)    Hiatal hernia    History of kidney stones    HLD (hyperlipidemia)    HTN (hypertension)    Myocardial infarction ([place])    1996   Personal history of colonic polyps 03/12/2010   TUBULAR ADENOMA   Presence of permanent cardiac pacemaker    Tobacco abuse    Tubular adenoma of colon    Type 2 diabetes mellitus ([place])    Past Surgical History:  Procedure Laterality Date   ANGIOPLASTY     X3   BACK SURGERY     CARDIAC CATHETERIZATION     8 years ago stents   COLONOSCOPY N/A 02/16/2017   Procedure: COLONOSCOPY;  Surgeon: Jerene Bears, MD;  Location: [facility];  Service: Gastroenterology;  Laterality: N/A;   CORONARY ARTERY BYPASS GRAFT N/A 07/19/2020   Procedure: CORONARY ARTERY BYPASS GRAFTING (CABG) TIMES THREE USING LEFT INTERNAL MAMMARY ARTERY AND RIGHT GREATER SAPHENOUS VEIN HARVESTED ENDOSCOPICALLY;  Surgeon: Gaye Pollack, MD;  Location: [place];  Service: Open Heart Surgery;  Laterality: N/A;   INSERT / REPLACE / REMOVE PACEMAKER  06/20/2018   LEFT HEART CATH AND CORONARY ANGIOGRAPHY N/A 11/05/2016   Procedure: Left Heart Cath and Coronary Angiography;  Surgeon: Nelva Bush, MD;  Location: [place] [facility];  Service: Cardiovascular;  Laterality: N/A;   LEFT HEART CATH AND CORONARY ANGIOGRAPHY N/A 07/12/2020   Procedure: LEFT HEART CATH AND CORONARY ANGIOGRAPHY;   Surgeon: Burnell Blanks, MD;  Location: [place] [facility];  Service: Cardiovascular;  Laterality: N/A;   LUMBAR LAMINECTOMY/DECOMPRESSION MICRODISCECTOMY N/A 05/12/2018   Procedure: Microlumbar decompression L2-3, possible L1-2;  Surgeon: Susa Day, MD;  Location: [place];  Service: Orthopedics;  Laterality: N/A;  2 hrs, Microlumbar decompression L2-3, possible L1-2   PACEMAKER IMPLANT N/A 06/20/2018   Procedure: PACEMAKER IMPLANT;  Surgeon: Evans Lance, MD;  Location: [place] [facility];  Service:  Cardiovascular;  Laterality: N/A;   PERCUTANEOUS CORONARY ROTOBLATOR INTERVENTION (PCI-R) N/A 06/30/2012   Procedure: PERCUTANEOUS CORONARY ROTOBLATOR INTERVENTION (PCI-R);  Surgeon: Burnell Blanks, MD;  Location: [facility];  Service: Cardiovascular;  Laterality: N/A;   TEE WITHOUT CARDIOVERSION N/A 07/19/2020   Procedure: TRANSESOPHAGEAL ECHOCARDIOGRAM (TEE);  Surgeon: Gaye Pollack, MD;  Location: [place];  Service: Open Heart Surgery;  Laterality: N/A;   VIDEO BRONCHOSCOPY  08/13/2011   Procedure: VIDEO BRONCHOSCOPY WITHOUT FLUORO;  Surgeon: Tanda Rockers, MD;  Location: [facility];  Service: Cardiopulmonary;  Laterality: Bilateral;   History   Social History   Marital Status: Married    Spouse Name: N/A    Number of Children: 1   Occupational History   Radio broadcast assistant    Social History Main Topics   Smoking status: Former Smoker -- 0.50 packs/day for 40 years    Types: Cigarettes    Quit date: 08/25/2011   Smokeless tobacco: Never Used   Alcohol Use: 0.0 oz/week     Comment: rare   Drug Use: No   Current Outpatient Medications on File Prior to Visit  Medication Sig Dispense Refill   acetaminophen (TYLENOL) 500 MG tablet Take 1,000 mg by mouth every 6 (six) hours as needed (pain).      albuterol (VENTOLIN HFA) 108 (90 Base) MCG/ACT inhaler INHALE 2 PUFFS INTO THE LUNGS EVERY 6 HOURS AS NEEDED FOR WHEEZE OR SHORTNESS OF BREATH 8.5 each 3   aspirin EC 325 MG  EC tablet Take 1 tablet (325 mg total) by mouth daily. 30 tablet 0   Choline Fenofibrate (FENOFIBRIC ACID) 135 MG CPDR Take 1 tablet by mouth daily. 30 capsule 11   famotidine (PEPCID) 20 MG tablet Take 20 mg by mouth daily as needed for heartburn.     hydrochlorothiazide (HYDRODIURIL) 25 MG tablet Take 0.5 tablets (12.5 mg total) by mouth daily as needed. (Patient taking differently: Take 12.5 mg by mouth daily as needed. Prn swelling.) 90 tablet 3   ipratropium (ATROVENT) 0.06 % nasal spray Place 2 sprays into both nostrils 4 (four) times daily. @ sprays at night daiyl as needed. 15 mL 5   metFORMIN (GLUCOPHAGE) 500 MG tablet Take 1 tablet (500 mg total) by mouth 2 (two) times daily with a meal. 180 tablet 3   metoprolol tartrate (LOPRESSOR) 25 MG tablet Take 0.5 tablets (12.5 mg total) by mouth 2 (two) times daily. (Patient taking differently: Take 25 mg by mouth 2 (two) times daily. If pts HR is trends to be <50 , then decrease to 1/2 tab) 90 tablet 3   niacin 500 MG tablet Take 500 mg by mouth at bedtime.     nitroGLYCERIN (NITROSTAT) 0.4 MG SL tablet Place 1 tablet (0.4 mg total) under the tongue every 5 (five) minutes as needed for chest pain. 25 tablet 6   pantoprazole (PROTONIX) 40 MG tablet TAKE 1 TABLET BY MOUTH EVERY DAY 90 tablet 1   RABEprazole (ACIPHEX) 20 MG tablet Take 1 tablet (20 mg total) by mouth daily in the afternoon. 30 tablet 11   rosuvastatin (CRESTOR) 20 MG tablet TAKE 1 TABLET BY MOUTH EVERY DAY 30 tablet 11   No current facility-administered medications on file prior to visit.   Allergies  Allergen Reactions   Amlodipine Besylate Other (See Comments)    Headache, weakness, dizziness   Propofol Other (See Comments)    Heart rate dropped     Lisinopril Cough and Rash   Metoprolol  Lowered heart rate too much    Rosuvastatin Other (See Comments)     Corncern for muscle aches but he is tolerating now    Family History  Problem Relation Age of Onset   Heart  disease Father        hx of MI   Diabetes Mother        and brother    Kidney failure Mother    Breast cancer Sister    Colon cancer Neg Hx    Stomach cancer Neg Hx    PE: BP 128/88 (BP Location: Right Arm, Patient Position: Sitting, Cuff Size: Normal)   Pulse 68   Ht 5\' 10"  (1.778 m)   Wt 183 lb 12.8 oz (83.4 kg)   SpO2 94%   BMI 26.37 kg/m  Wt Readings from Last 3 Encounters:  11/01/20 183 lb 12.8 oz (83.4 kg)  10/23/20 184 lb 8.4 oz (83.7 kg)  10/09/20 184 lb (83.5 kg)   Constitutional: Slightly overweight, in NAD Eyes: PERRLA, EOMI, no exophthalmos ENT: moist mucous membranes, no thyromegaly, no cervical lymphadenopathy Cardiovascular: RRR, No MRG Respiratory: CTA B Gastrointestinal: abdomen soft, NT, ND, BS+ Musculoskeletal: no deformities, strength intact in all 4 Skin: moist, warm, no rashes Neurological: no tremor with outstretched hands, DTR normal in all 4  ASSESSMENT: 1. Mild hypothyroidism  2. DM2, diet- controlled - + CAD, s/p CABG 07/19/2020  3.  Overweight  PLAN:  1. Patient with history of mild hypothyroidism, not on levothyroxine -Latest TFTs were reviewed and these were normal: Lab Results  Component Value Date   TSH 3.96 03/20/2020  - he continues without levothyroxine - pt feels good and has good energy, no hypothyroid symptoms and signs - We will check TFTs at next visit  2. DM2 -Patient with well-controlled type 2 diabetes, but with increased HbA1c at last visit, at 7.3%.  Sugars were elevated, above target.  She was started on low-dose metformin in the hospital and we increase this to 500 mg twice a day at last visit.  -He had nausea since then and 2 weeks ago he stopped metformin.  Nausea improved slightly.  He does not feel that his sugars worsened significantly since then.  However, per review of his blood sugars at home, they fluctuate between 80s and 150s before meals.  I did suggest to restart metformin in the ER form, which is better  tolerated, to reduce the variability.  He agrees to do so.  I advised him that he can take the entire dose at night. - I advised him to call Patient Instructions  Please start:  - Metformin ER 500 mg 1x a day with dinner, then increase to 1000 mg after 1 week  Please return in 4 months with your sugar log.   - we checked his HbA1c: 6.2% (excellent) - advised to check sugars at different times of the day - 1x a day, rotating check times - advised for yearly eye exams >> he is UTD - return to clinic in 4 months  3.  Overweight -In the past, I recommended a low-fat plant-based diet and gave him materials.  He did not develop this, however, but continues to limit sweets and fried foods -She lost 8 pounds before last visit, weight stable at this visit -We will restart metformin, which is weight stabilizing long-term   Philemon Kingdom, MD PhD [facility]

## 2020-11-01 NOTE — Patient Instructions (Addendum)
Please start:  - Metformin ER 500 mg 1x a day with dinner, then increase to 1000 mg after 1 week  Please return in 4 months with your sugar log.

## 2020-11-01 NOTE — Progress Notes (Signed)
Daily Session Note  Patient Details  Name: Daniel Carey MRN: [id number] Date of Birth: 1950/08/29 Referring Provider:   Flowsheet Row CARDIAC REHAB PHASE II ORIENTATION from 09/26/2020 in [place]  Referring Provider Dr. Acie Fredrickson       Encounter Date: 11/01/2020  Check In:  Session Check In - 11/01/20 0815       Check-In   Supervising physician immediately available to respond to emergencies [facility] MD immediately available    Physician(s) Dr. Harl Bowie    Location [facility]    Staff Present Hoy Register, MS, ACSM-CEP, Exercise Physiologist;Debra Wynetta Emery, RN, BSN    Virtual Visit No    Medication changes reported     No    Fall or balance concerns reported    No    Tobacco Cessation No Change    Warm-up and Cool-down Performed as group-led instruction    Resistance Training Performed Yes    VAD Patient? No    PAD/SET Patient? No      Pain Assessment   Currently in Pain? No/denies    Pain Score 0-No pain    Multiple Pain Sites No             Capillary Blood Glucose: No results found for this or any previous visit (from the past 24 hour(s)).    Social History   Tobacco Use  Smoking Status Former   Packs/day: 0.50   Years: 40.00   Pack years: 20.00   Types: Cigarettes   Quit date: 08/25/2011   Years since quitting: 9.1  Smokeless Tobacco Never    Goals Met:  Independence with exercise equipment Exercise tolerated well No report of cardiac concerns or symptoms Strength training completed today  Goals Unmet:  Not Applicable  Comments: checkout time is 0915   Dr. Kathie Dike is Medical Director for [facility] Pulmonary Re[facility].

## 2020-11-04 ENCOUNTER — Other Ambulatory Visit: Payer: Self-pay

## 2020-11-04 ENCOUNTER — Encounter (HOSPITAL_COMMUNITY)
Admission: RE | Admit: 2020-11-04 | Discharge: 2020-11-04 | Disposition: A | Discharge status: Home / Self Care | Payer: BC Managed Care – PPO | Source: Ambulatory Visit | Attending: Surgery | Admitting: Surgery

## 2020-11-04 VITALS — Wt 185.4 lb

## 2020-11-04 DIAGNOSIS — Z951 Presence of aortocoronary bypass graft: Secondary | ICD-10-CM | POA: Diagnosis not present

## 2020-11-04 NOTE — Progress Notes (Signed)
Daily Session Note  Patient Details  Name: Daniel Carey MRN: [id number] Date of Birth: 1950/07/15 Referring Provider:   Flowsheet Row CARDIAC REHAB PHASE II ORIENTATION from 09/26/2020 in [place]  Referring Provider Dr. Acie Fredrickson       Encounter Date: 11/04/2020  Check In:  Session Check In - 11/04/20 [place]       Check-In   Supervising physician immediately available to respond to emergencies [facility] MD immediately available    Physician(s) Dr. Harrington Challenger    Location [facility]    Staff Present Hoy Register, MS, ACSM-CEP, Exercise Physiologist;Debra Wynetta Emery, RN, BSN    Virtual Visit No    Medication changes reported     No    Fall or balance concerns reported    No    Tobacco Cessation No Change    Warm-up and Cool-down Performed as group-led instruction    Resistance Training Performed Yes    VAD Patient? No    PAD/SET Patient? No      Pain Assessment   Currently in Pain? No/denies    Pain Score 0-No pain    Multiple Pain Sites No             Capillary Blood Glucose: No results found for this or any previous visit (from the past 24 hour(s)).    Social History   Tobacco Use  Smoking Status Former   Packs/day: 0.50   Years: 40.00   Pack years: 20.00   Types: Cigarettes   Quit date: 08/25/2011   Years since quitting: 9.2  Smokeless Tobacco Never    Goals Met:  Independence with exercise equipment Exercise tolerated well No report of cardiac concerns or symptoms Strength training completed today  Goals Unmet:  Not Applicable  Comments: checkout time is 1545   Dr. Kathie Dike is Medical Director for [facility] Pulmonary Rehab.

## 2020-11-06 ENCOUNTER — Other Ambulatory Visit: Payer: Self-pay

## 2020-11-06 ENCOUNTER — Encounter (HOSPITAL_COMMUNITY)
Admission: RE | Admit: 2020-11-06 | Discharge: 2020-11-06 | Disposition: A | Discharge status: Home / Self Care | Payer: BC Managed Care – PPO | Source: Ambulatory Visit | Attending: Surgery | Admitting: Surgery

## 2020-11-06 DIAGNOSIS — Z951 Presence of aortocoronary bypass graft: Secondary | ICD-10-CM | POA: Diagnosis not present

## 2020-11-06 NOTE — Progress Notes (Signed)
Daily Session Note  Patient Details  Name: Daniel Carey MRN: [id number] Date of Birth: 1950/12/19 Referring Provider:   Flowsheet Row CARDIAC REHAB PHASE II ORIENTATION from 09/26/2020 in [place]  Referring Provider Dr. Acie Fredrickson       Encounter Date: 11/06/2020  Check In:  Session Check In - 11/06/20 1445       Check-In   Supervising physician immediately available to respond to emergencies [facility] MD immediately available    Physician(s) Dr. Domenic Polite    Location [facility]    Staff Present Hoy Register, MS, ACSM-CEP, Exercise Physiologist;Other    Virtual Visit No    Medication changes reported     No    Fall or balance concerns reported    No    Tobacco Cessation No Change    Warm-up and Cool-down Performed as group-led instruction    Resistance Training Performed Yes    VAD Patient? No    PAD/SET Patient? No      Pain Assessment   Currently in Pain? No/denies    Pain Score 0-No pain    Multiple Pain Sites No             Capillary Blood Glucose: No results found for this or any previous visit (from the past 24 hour(s)).    Social History   Tobacco Use  Smoking Status Former   Packs/day: 0.50   Years: 40.00   Pack years: 20.00   Types: Cigarettes   Quit date: 08/25/2011   Years since quitting: 9.2  Smokeless Tobacco Never    Goals Met:  Independence with exercise equipment Exercise tolerated well No report of cardiac concerns or symptoms Strength training completed today  Goals Unmet:  Not Applicable  Comments: checkout time is 1545   Dr. Kathie Dike is Medical Director for [facility] Pulmonary Rehab.

## 2020-11-06 NOTE — Progress Notes (Signed)
Cardiac Individual Treatment Plan  Patient Details  Name: Daniel Carey MRN: [id number] Date of Birth: Aug 06, 1950 Referring Provider:   Flowsheet Row CARDIAC REHAB PHASE II ORIENTATION from 09/26/2020 in [facility]  Referring Provider Dr. Acie Fredrickson       Initial Encounter Date:  Flowsheet Row CARDIAC REHAB PHASE II ORIENTATION from 09/26/2020 in [facility]  Date 09/26/20       Visit Diagnosis: S/P CABG x 3  Patient's Home Medications on Admission:  Current Outpatient Medications:    acetaminophen (TYLENOL) 500 MG tablet, Take 1,000 mg by mouth every 6 (six) hours as needed (pain). , Disp: , Rfl:    albuterol (VENTOLIN HFA) 108 (90 Base) MCG/ACT inhaler, INHALE 2 PUFFS INTO THE LUNGS EVERY 6 HOURS AS NEEDED FOR WHEEZE OR SHORTNESS OF BREATH, Disp: 8.5 each, Rfl: 3   aspirin EC 325 MG EC tablet, Take 1 tablet (325 mg total) by mouth daily., Disp: 30 tablet, Rfl: 0   Choline Fenofibrate (FENOFIBRIC ACID) 135 MG CPDR, Take 1 tablet by mouth daily., Disp: 30 capsule, Rfl: 11   famotidine (PEPCID) 20 MG tablet, Take 20 mg by mouth daily as needed for heartburn., Disp: , Rfl:    hydrochlorothiazide (HYDRODIURIL) 25 MG tablet, Take 0.5 tablets (12.5 mg total) by mouth daily as needed. (Patient taking differently: Take 12.5 mg by mouth daily as needed. Prn swelling.), Disp: 90 tablet, Rfl: 3   ipratropium (ATROVENT) 0.06 % nasal spray, Place 2 sprays into both nostrils 4 (four) times daily. @ sprays at night daiyl as needed., Disp: 15 mL, Rfl: 5   metFORMIN (GLUCOPHAGE-XR) 500 MG 24 hr tablet, Take 2 tablets (1,000 mg total) by mouth daily with supper., Disp: 180 tablet, Rfl: 3   metoprolol tartrate (LOPRESSOR) 25 MG tablet, Take 0.5 tablets (12.5 mg total) by mouth 2 (two) times daily. (Patient taking differently: Take 25 mg by mouth 2 (two) times daily. If pts HR is trends to be <50 , then decrease to 1/2 tab), Disp: 90 tablet, Rfl: 3   niacin 500 MG  tablet, Take 500 mg by mouth at bedtime., Disp: , Rfl:    nitroGLYCERIN (NITROSTAT) 0.4 MG SL tablet, Place 1 tablet (0.4 mg total) under the tongue every 5 (five) minutes as needed for chest pain., Disp: 25 tablet, Rfl: 6   pantoprazole (PROTONIX) 40 MG tablet, TAKE 1 TABLET BY MOUTH EVERY DAY, Disp: 90 tablet, Rfl: 1   RABEprazole (ACIPHEX) 20 MG tablet, Take 1 tablet (20 mg total) by mouth daily in the afternoon., Disp: 30 tablet, Rfl: 11   rosuvastatin (CRESTOR) 20 MG tablet, TAKE 1 TABLET BY MOUTH EVERY DAY, Disp: 30 tablet, Rfl: 11  Past Medical History: 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    noted on EKG March 2013   CAD (coronary artery disease)    s/p atherectomy, PTCA/DES x2 mid & distal RCA 06/30/12   Carotid artery occlusion    Chest pain    Complication of anesthesia    heart rate dropping with propofol last procedure   COPD (chronic obstructive pulmonary disease) ([place])    Dyspnea    ED (erectile dysfunction)    Esophageal stricture    External hemorrhoids without mention of complication    GERD (gastroesophageal reflux disease)    Headache(784.0)    Hiatal hernia    History of kidney stones    HLD (hyperlipidemia)  HTN (hypertension)    Myocardial infarction [facility])    1996   Personal history of colonic polyps 03/12/2010   TUBULAR ADENOMA   Presence of permanent cardiac pacemaker    Tobacco abuse    Tubular adenoma of colon    Type 2 diabetes mellitus (HCC)     Tobacco Use: Social History   Tobacco Use  Smoking Status Former   Packs/day: 0.50   Years: 40.00   Pack years: 20.00   Types: Cigarettes   Quit date: 08/25/2011   Years since quitting: 9.2  Smokeless Tobacco Never    Labs: Recent Review Flowsheet Data     Labs for ITP Cardiac and Pulmonary Rehab Latest Ref Rng & Units 07/19/2020 07/19/2020 07/19/2020 07/19/2020 11/01/2020   Cholestrol 100 - 199 mg/dL - - - - -   LDLCALC 0 - 99 mg/dL - - - - -    LDLDIRECT mg/dL - - - - -   HDL >39 mg/dL - - - - -   Trlycerides 0 - 149 mg/dL - - - - -   Hemoglobin A1c 4.0 - 5.6 % - - - - 6.2(A)   PHART 7.350 - 7.450 - 7.388 7.315(L) 7.342(L) -   PCO2ART 32.0 - 48.0 mmHg - 45.7 50.3(H) 43.7 -   HCO3 20.0 - 28.0 mmol/L - 27.5 25.8 23.9 -   TCO2 22 - 32 mmol/L 29 29 27 25  -   ACIDBASEDEF 0.0 - 2.0 mmol/L - - 1.0 2.0 -   O2SAT % - 100.0 97.0 96.0 -       Capillary Blood Glucose: Lab Results  Component Value Date   GLUCAP 120 (H) 09/30/2020   GLUCAP 92 09/26/2020   GLUCAP 124 (H) 07/24/2020   GLUCAP 100 (H) 07/24/2020   GLUCAP 110 (H) 07/23/2020     Exercise Target Goals: Exercise Program Goal: Individual exercise prescription set using results from initial 6 min walk test and THRR while considering  patient's activity barriers and safety.   Exercise Prescription Goal: Starting with aerobic activity 30 plus minutes a day, 3 days per week for initial exercise prescription. Provide home exercise prescription and guidelines that participant acknowledges understanding prior to discharge.  Activity Barriers & Risk Stratification:  Activity Barriers & Cardiac Risk Stratification - 09/26/20 1247       Activity Barriers & Cardiac Risk Stratification   Activity Barriers None    Cardiac Risk Stratification High             6 Minute Walk:  6 Minute Walk     Row Name 09/26/20 1422         6 Minute Walk   Phase Initial     Distance 1500 feet     Walk Time 6 minutes     # of Rest Breaks 0     MPH 2.65     METS 3.16     RPE 12     VO2 Peak 11.05     Symptoms Yes (comment)     Comments left hip pain 3/10     Resting HR 70 bpm     Resting BP 108/50     Resting Oxygen Saturation  96 %     Exercise Oxygen Saturation  during 6 min walk 94 %     Max Ex. HR 83 bpm     Max Ex. BP 134/80     2 Minute Post BP 112/78  Oxygen Initial Assessment:   Oxygen Re-Evaluation:   Oxygen Discharge (Final Oxygen  Re-Evaluation):   Initial Exercise Prescription:  Initial Exercise Prescription - 09/26/20 1400       Date of Initial Exercise RX and Referring Provider   Date 09/26/20    Referring Provider Dr. Acie Fredrickson    Expected Discharge Date 12/20/20      Treadmill   MPH 2    Grade 0    Minutes 17      Recumbant Elliptical   Level 1    RPM 60    Minutes 22      Prescription Details   Frequency (times per week) 3    Duration Progress to 30 minutes of continuous aerobic without signs/symptoms of physical distress      Intensity   THRR 40-80% of Max Heartrate 61-122    Ratings of Perceived Exertion 11-13    Perceived Dyspnea 0-4      Resistance Training   Training Prescription Yes    Weight 3 lbs    Reps 10-15             Perform Capillary Blood Glucose checks as needed.  Exercise Prescription Changes:   Exercise Prescription Changes     Row Name 10/07/20 1500 10/23/20 1500 11/04/20 1500         Response to Exercise   Blood Pressure (Admit) 130/86 120/62 140/70     Blood Pressure (Exercise) 180/100 162/88 144/72     Blood Pressure (Exit) 130/70 100/66 106/70     Heart Rate (Admit) 72 bpm 71 bpm 65 bpm     Heart Rate (Exercise) 103 bpm 102 bpm 99 bpm     Heart Rate (Exit) 81 bpm 81 bpm 76 bpm     Rating of Perceived Exertion (Exercise) 13 12 12      Duration Continue with 30 min of aerobic exercise without signs/symptoms of physical distress. Continue with 30 min of aerobic exercise without signs/symptoms of physical distress. Continue with 30 min of aerobic exercise without signs/symptoms of physical distress.     Intensity THRR unchanged THRR unchanged THRR unchanged           Progression       Progression Continue to progress workloads to maintain intensity without signs/symptoms of physical distress. Continue to progress workloads to maintain intensity without signs/symptoms of physical distress. Continue to progress workloads to maintain intensity without  signs/symptoms of physical distress.           Resistance Training       Training Prescription Yes Yes Yes     Weight 5 lbs 5 lbs 5 lbs     Reps 10-15 10-15 10-15     Time 10 Minutes 10 Minutes 10 Minutes           Treadmill       MPH 2 2.6 2.7     Grade 0 1 2     Minutes 22 22 22      METs 2.53 3.35 3.81           Recumbant Elliptical       Level 1 1 1      RPM 63 72 67     Minutes 17 17 17      METs 3.7 4.8 4.5             Exercise Comments:   Exercise Goals and Review:   Exercise Goals     Row Name 09/26/20 1425 10/08/20 0819 11/04/20 1556  Exercise Goals   Increase Physical Activity Yes Yes Yes     Intervention Provide advice, education, support and counseling about physical activity/exercise needs.;Develop an individualized exercise prescription for aerobic and resistive training based on initial evaluation findings, risk stratification, comorbidities and participant's personal goals. Provide advice, education, support and counseling about physical activity/exercise needs.;Develop an individualized exercise prescription for aerobic and resistive training based on initial evaluation findings, risk stratification, comorbidities and participant's personal goals. Provide advice, education, support and counseling about physical activity/exercise needs.;Develop an individualized exercise prescription for aerobic and resistive training based on initial evaluation findings, risk stratification, comorbidities and participant's personal goals.     Expected Outcomes Short Term: Attend rehab on a regular basis to increase amount of physical activity.;Long Term: Exercising regularly at least 3-5 days a week.;Long Term: Add in home exercise to make exercise part of routine and to increase amount of physical activity. Short Term: Attend rehab on a regular basis to increase amount of physical activity.;Long Term: Exercising regularly at least 3-5 days a week.;Long Term: Add in home  exercise to make exercise part of routine and to increase amount of physical activity. Short Term: Attend rehab on a regular basis to increase amount of physical activity.;Long Term: Exercising regularly at least 3-5 days a week.;Long Term: Add in home exercise to make exercise part of routine and to increase amount of physical activity.     Increase Strength and Stamina Yes Yes Yes     Intervention Provide advice, education, support and counseling about physical activity/exercise needs.;Develop an individualized exercise prescription for aerobic and resistive training based on initial evaluation findings, risk stratification, comorbidities and participant's personal goals. Provide advice, education, support and counseling about physical activity/exercise needs.;Develop an individualized exercise prescription for aerobic and resistive training based on initial evaluation findings, risk stratification, comorbidities and participant's personal goals. Provide advice, education, support and counseling about physical activity/exercise needs.;Develop an individualized exercise prescription for aerobic and resistive training based on initial evaluation findings, risk stratification, comorbidities and participant's personal goals.     Expected Outcomes Short Term: Increase workloads from initial exercise prescription for resistance, speed, and METs.;Short Term: Perform resistance training exercises routinely during rehab and add in resistance training at home;Long Term: Improve cardiorespiratory fitness, muscular endurance and strength as measured by increased METs and functional capacity (6MWT) Short Term: Increase workloads from initial exercise prescription for resistance, speed, and METs.;Short Term: Perform resistance training exercises routinely during rehab and add in resistance training at home;Long Term: Improve cardiorespiratory fitness, muscular endurance and strength as measured by increased METs and  functional capacity (6MWT) Short Term: Increase workloads from initial exercise prescription for resistance, speed, and METs.;Short Term: Perform resistance training exercises routinely during rehab and add in resistance training at home;Long Term: Improve cardiorespiratory fitness, muscular endurance and strength as measured by increased METs and functional capacity (6MWT)     Able to understand and use rate of perceived exertion (RPE) scale Yes Yes Yes     Intervention Provide education and explanation on how to use RPE scale Provide education and explanation on how to use RPE scale Provide education and explanation on how to use RPE scale     Expected Outcomes Short Term: Able to use RPE daily in rehab to express subjective intensity level;Long Term:  Able to use RPE to guide intensity level when exercising independently Short Term: Able to use RPE daily in rehab to express subjective intensity level;Long Term:  Able to use RPE to guide intensity level when  exercising independently Short Term: Able to use RPE daily in rehab to express subjective intensity level;Long Term:  Able to use RPE to guide intensity level when exercising independently     Knowledge and understanding of Target Heart Rate Range (THRR) Yes Yes Yes     Intervention Provide education and explanation of THRR including how the numbers were predicted and where they are located for reference Provide education and explanation of THRR including how the numbers were predicted and where they are located for reference Provide education and explanation of THRR including how the numbers were predicted and where they are located for reference     Expected Outcomes Short Term: Able to state/look up THRR;Long Term: Able to use THRR to govern intensity when exercising independently;Short Term: Able to use daily as guideline for intensity in rehab Short Term: Able to state/look up THRR;Long Term: Able to use THRR to govern intensity when exercising  independently;Short Term: Able to use daily as guideline for intensity in rehab Short Term: Able to state/look up THRR;Long Term: Able to use THRR to govern intensity when exercising independently;Short Term: Able to use daily as guideline for intensity in rehab     Able to check pulse independently Yes Yes Yes     Intervention Provide education and demonstration on how to check pulse in carotid and radial arteries.;Review the importance of being able to check your own pulse for safety during independent exercise Provide education and demonstration on how to check pulse in carotid and radial arteries.;Review the importance of being able to check your own pulse for safety during independent exercise Provide education and demonstration on how to check pulse in carotid and radial arteries.;Review the importance of being able to check your own pulse for safety during independent exercise     Expected Outcomes Long Term: Able to check pulse independently and accurately;Short Term: Able to explain why pulse checking is important during independent exercise Long Term: Able to check pulse independently and accurately;Short Term: Able to explain why pulse checking is important during independent exercise Long Term: Able to check pulse independently and accurately;Short Term: Able to explain why pulse checking is important during independent exercise     Understanding of Exercise Prescription Yes Yes Yes     Intervention Provide education, explanation, and written materials on patient's individual exercise prescription Provide education, explanation, and written materials on patient's individual exercise prescription Provide education, explanation, and written materials on patient's individual exercise prescription     Expected Outcomes Short Term: Able to explain program exercise prescription;Long Term: Able to explain home exercise prescription to exercise independently Short Term: Able to explain program exercise  prescription;Long Term: Able to explain home exercise prescription to exercise independently Short Term: Able to explain program exercise prescription;Long Term: Able to explain home exercise prescription to exercise independently              Exercise Goals Re-Evaluation :  Exercise Goals Re-Evaluation     Row Name 10/08/20 0819 11/04/20 1557           Exercise Goal Re-Evaluation   Exercise Goals Review Increase Physical Activity;Increase Strength and Stamina;Able to understand and use rate of perceived exertion (RPE) scale;Knowledge and understanding of Target Heart Rate Range (THRR);Able to check pulse independently;Understanding of Exercise Prescription Increase Physical Activity;Increase Strength and Stamina;Able to understand and use rate of perceived exertion (RPE) scale;Knowledge and understanding of Target Heart Rate Range (THRR);Able to check pulse independently;Understanding of Exercise Prescription  Comments Pt has completed 5 sessions of cardiac rehab. He is slowly gaining his strength back, as he has complained of incisional discomfort and weakness in his chest muscles since his CABG. His BP, especially his diastolic has been elevated during exercise and sometimes at rest. His DBP reached 100 while exercising during his last visit. He is unsure if he wants to do the whole program, but said that he would like to do at least six weeks. I think he would benefit from the whole program, as he is already showing progress. He currently exercises at 3.7 METs on the elliptical. Will continue to monitor and progress as able. Pt has completed 14 sessions of cardiac rehab. His blood pressures have been much lower as of late, as they were concerningly high when he first entered the program. He is progressing well through the program and has been able to increase his workloads. He is currently exercising at 4.5 METs on the elliptical. Will continue to monitor and progress as able.       Expected Outcomes Through exercise at rehab and at home the patient will meet their goals. Through exercise at rehab and at home the patient will meet their goals.                Discharge Exercise Prescription (Final Exercise Prescription Changes):  Exercise Prescription Changes - 11/04/20 1500       Response to Exercise   Blood Pressure (Admit) 140/70    Blood Pressure (Exercise) 144/72    Blood Pressure (Exit) 106/70    Heart Rate (Admit) 65 bpm    Heart Rate (Exercise) 99 bpm    Heart Rate (Exit) 76 bpm    Rating of Perceived Exertion (Exercise) 12    Duration Continue with 30 min of aerobic exercise without signs/symptoms of physical distress.    Intensity THRR unchanged      Progression   Progression Continue to progress workloads to maintain intensity without signs/symptoms of physical distress.      Resistance Training   Training Prescription Yes    Weight 5 lbs    Reps 10-15    Time 10 Minutes      Treadmill   MPH 2.7    Grade 2    Minutes 22    METs 3.81      Recumbant Elliptical   Level 1    RPM 67    Minutes 17    METs 4.5             Nutrition:  Target Goals: Understanding of nutrition guidelines, daily intake of sodium 1500mg , cholesterol 200mg , calories 30% from fat and 7% or less from saturated fats, daily to have 5 or more servings of fruits and vegetables.  Biometrics:  Pre Biometrics - 09/26/20 1426       Pre Biometrics   Height 5\' 10"  (1.778 m)    Weight 83.4 kg    Waist Circumference 41 inches    Hip Circumference 41 inches    Waist to Hip Ratio 1 %    BMI (Calculated) 26.38    Triceps Skinfold 11 mm    % Body Fat 26.1 %    Grip Strength 34.7 kg    Flexibility 12.5 in    Single Leg Stand 8.82 seconds              Nutrition Therapy Plan and Nutrition Goals:  Nutrition Therapy & Goals - 09/26/20 1357       Personal Nutrition Goals  Comments Patient scored 28 on his diet assessment. Score discussed and handout  provided and explained regarding healthier choices and handouts on DM control. We provide 2 educational sessions regarding heart healthy nutrition with handouts and offer assistance with RD referral if patient is interested.      Intervention Plan   Intervention Nutrition handout(s) given to patient.             Nutrition Assessments:  Nutrition Assessments - 09/26/20 1357       MEDFICTS Scores   Pre Score 28            MEDIFICTS Score Key: ?70 Need to make dietary changes  40-70 Heart Healthy Diet ? 40 Therapeutic Level Cholesterol Diet   Picture Your Plate Scores: <20 Unhealthy dietary pattern with much room for improvement. 41-50 Dietary pattern unlikely to meet recommendations for good health and room for improvement. 51-60 More healthful dietary pattern, with some room for improvement.  >60 Healthy dietary pattern, although there may be some specific behaviors that could be improved.    Nutrition Goals Re-Evaluation:   Nutrition Goals Discharge (Final Nutrition Goals Re-Evaluation):   Psychosocial: Target Goals: Acknowledge presence or absence of significant depression and/or stress, maximize coping skills, provide positive support system. Participant is able to verbalize types and ability to use techniques and skills needed for reducing stress and depression.  Initial Review & Psychosocial Screening:  Initial Psych Review & Screening - 09/26/20 1401       Initial Review   Current issues with None Identified      Family Dynamics   Good Support System? Yes      Barriers   Psychosocial barriers to participate in program There are no identifiable barriers or psychosocial needs.      Screening Interventions   Interventions Encouraged to exercise;Other (comment)    Expected Outcomes Short Term goal: Identification and review with participant of any Quality of Life or Depression concerns found by scoring the questionnaire.             Quality of Life  Scores:  Quality of Life - 09/26/20 1422       Quality of Life   Select Quality of Life      Quality of Life Scores   Health/Function Pre 22.25 %    Socioeconomic Pre 27 %    Psych/Spiritual Pre 26.57 %    Family Pre 27.6 %    GLOBAL Pre 24.98 %            Scores of 19 and below usually indicate a poorer quality of life in these areas.  A difference of  2-3 points is a clinically meaningful difference.  A difference of 2-3 points in the total score of the Quality of Life Index has been associated with significant improvement in overall quality of life, self-image, physical symptoms, and general health in studies assessing change in quality of life.  PHQ-9: Recent Review Flowsheet Data     Depression screen [facility] 2/9 09/26/2020 03/01/2020 12/02/2018   Decreased Interest 0 0 0   Down, Depressed, Hopeless 0 0 0   PHQ - 2 Score 0 0 0   Altered sleeping 2 1 -   Tired, decreased energy 3 1 -   Change in appetite 0 1 -   Feeling bad or failure about yourself  0 0 -   Trouble concentrating 0 0 -   Moving slowly or fidgety/restless 0 0 -   Suicidal thoughts 0 0 -  PHQ-9 Score 5 3 -   Difficult doing work/chores Not difficult at all Not difficult at all -      Interpretation of Total Score  Total Score Depression Severity:  1-4 = Minimal depression, 5-9 = Mild depression, 10-14 = Moderate depression, 15-19 = Moderately severe depression, 20-27 = Severe depression   Psychosocial Evaluation and Intervention:  Psychosocial Evaluation - 09/26/20 1402       Psychosocial Evaluation & Interventions   Interventions Stress management education;Relaxation education;Encouraged to exercise with the program and follow exercise prescription    Comments Patient has no psychosocial barrers to participate in CR at his orientation visit. He denies any depression or anxiety. His initial PHQ-9 score was 5 related to physical symptoms since his CABG. He names his wife as his support system. He has a  positive outlook and is ready to get stronger and be able to return to work. He demonstrates an interest to improving his health.    Expected Outcomes Patient will continue to have no psychosocial issues identified.    Continue Psychosocial Services  No Follow up required             Psychosocial Re-Evaluation:  Psychosocial Re-Evaluation     [place] Name 09/30/20 1456 10/28/20 1428           Psychosocial Re-Evaluation   Current issues with None Identified None Identified      Comments Patient is new to the program starting today. He continues to have no psychosocial barrers identified. We will continue to montior. Patient continues to have no psychosocial barrers identified. He has completed 11 sessions. He continues to demonstrates a desire to improve his health. We will continue to montior.      Expected Outcomes Patient will continue to have no psychosocial issues identified. Patient will continue to have no psychosocial issues identified.      Interventions Stress management education;Relaxation education;Encouraged to attend Cardiac Rehabilitation for the exercise Stress management education;Relaxation education;Encouraged to attend Cardiac Rehabilitation for the exercise      Continue Psychosocial Services  No Follow up required No Follow up required               Psychosocial Discharge (Final Psychosocial Re-Evaluation):  Psychosocial Re-Evaluation - 10/28/20 1428       Psychosocial Re-Evaluation   Current issues with None Identified    Comments Patient continues to have no psychosocial barrers identified. He has completed 11 sessions. He continues to demonstrates a desire to improve his health. We will continue to montior.    Expected Outcomes Patient will continue to have no psychosocial issues identified.    Interventions Stress management education;Relaxation education;Encouraged to attend Cardiac Rehabilitation for the exercise    Continue Psychosocial Services  No  Follow up required             Vocational Rehabilitation: Provide vocational rehab assistance to qualifying candidates.   Vocational Rehab Evaluation & Intervention:  Vocational Rehab - 09/26/20 1356       Initial Vocational Rehab Evaluation & Intervention   Assessment shows need for Vocational Rehabilitation No      Vocational Rehab Re-Evaulation   Comments Patient has a job and plans to return to work after he completes the program. He does not need vocational rehab.             Education: Education Goals: Education classes will be provided on a weekly basis, covering required topics. Participant will state understanding/return demonstration of topics presented.  Learning Barriers/Preferences:  Learning Barriers/Preferences - 09/26/20 1359       Learning Barriers/Preferences   Learning Barriers None    Learning Preferences Audio;Video;Written Material             Education Topics: Hypertension, Hypertension Reduction -Define heart disease and high blood pressure. Discus how high blood pressure affects the body and ways to reduce high blood pressure. Flowsheet Row CARDIAC REHAB PHASE II EXERCISE from 10/30/2020 in [place]  Date 10/23/20  Educator pb  Instruction Review Code 1- Verbalizes Understanding       Exercise and Your Heart -Discuss why it is important to exercise, the FITT principles of exercise, normal and abnormal responses to exercise, and how to exercise safely. Flowsheet Row CARDIAC REHAB PHASE II EXERCISE from 10/30/2020 in [place]  Date 10/30/20  Educator pb  Instruction Review Code 1- Verbalizes Understanding       Angina -Discuss definition of angina, causes of angina, treatment of angina, and how to decrease risk of having angina.   Cardiac Medications -Review what the following cardiac medications are used for, how they affect the body, and side effects that may occur when  taking the medications.  Medications include Aspirin, Beta blockers, calcium channel blockers, ACE Inhibitors, angiotensin receptor blockers, diuretics, digoxin, and antihyperlipidemics.   Congestive Heart Failure -Discuss the definition of CHF, how to live with CHF, the signs and symptoms of CHF, and how keep track of weight and sodium intake.   Heart Disease and Intimacy -Discus the effect sexual activity has on the heart, how changes occur during intimacy as we age, and safety during sexual activity.   Smoking Cessation / COPD -Discuss different methods to quit smoking, the health benefits of quitting smoking, and the definition of COPD.   Nutrition I: Fats -Discuss the types of cholesterol, what cholesterol does to the heart, and how cholesterol levels can be controlled.   Nutrition II: Labels -Discuss the different components of food labels and how to read food label   Heart Parts/Heart Disease and PAD -Discuss the anatomy of the heart, the pathway of blood circulation through the heart, and these are affected by heart disease.   Stress I: Signs and Symptoms -Discuss the causes of stress, how stress may lead to anxiety and depression, and ways to limit stress. Flowsheet Row CARDIAC REHAB PHASE II EXERCISE from 10/30/2020 in [place]  Date 10/02/20  Educator pb  Instruction Review Code 1- Verbalizes Understanding       Stress II: Relaxation -Discuss different types of relaxation techniques to limit stress.   Warning Signs of Stroke / TIA -Discuss definition of a stroke, what the signs and symptoms are of a stroke, and how to identify when someone is having stroke. Flowsheet Row CARDIAC REHAB PHASE II EXERCISE from 10/30/2020 in [place]  Date 10/16/20  Educator DL  Instruction Review Code 1- Verbalizes Understanding       Knowledge Questionnaire Score:  Knowledge Questionnaire Score - 09/26/20 1357        Knowledge Questionnaire Score   Pre Score 22/24             Core Components/Risk Factors/Patient Goals at Admission:  Personal Goals and Risk Factors at Admission - 09/26/20 1359       Core Components/Risk Factors/Patient Goals on Admission    Weight Management Weight Maintenance    Diabetes Yes    Intervention Provide education about signs/symptoms and action to take for hypo/hyperglycemia.;Provide education  about proper nutrition, including hydration, and aerobic/resistive exercise prescription along with prescribed medications to achieve blood glucose in normal ranges: Fasting glucose 65-99 mg/dL    Expected Outcomes Short Term: Participant verbalizes understanding of the signs/symptoms and immediate care of hyper/hypoglycemia, proper foot care and importance of medication, aerobic/resistive exercise and nutrition plan for blood glucose control.;Long Term: Attainment of HbA1C < 7%.    Personal Goal Other Yes    Personal Goal Get stronger; improve lung function; be able to return to work.    Intervention Patient will attend CR 3 days/week and supplement with exersice at  home 2 days/week.    Expected Outcomes Patient will complete the program meeting both program and personal goals.             Core Components/Risk Factors/Patient Goals Review:   Goals and Risk Factor Review     Row Name 09/30/20 1457 10/28/20 1434           Core Components/Risk Factors/Patient Goals Review   Personal Goals Review Weight Management/Obesity;Diabetes Weight Management/Obesity;Diabetes      Review Patient is new to the program starting today. He was referred to CR with [facility]. He has multiple risk factors for CAD and is participating in the program for risk modification. His personal goals for the program are to get stronger; improve his lung function; and get back to work. We will continue to monitor his progress as he works towards meeting these goals. Patient has completed 11 sessions  maintaining his weight since last 30 day review. He is doing well in the program with progressions and consistent attendance. He says he is getting stronger. He puts out great effort during the sessions. His personal goals for the program are to get stronger; improve his lung function; and be able to get back to work. We will continue to monitor his progress as he works towards meeting these goals.      Expected Outcomes Patient will complete the program meeting both personal and program goals. Patient will complete the program meeting both personal and program goals.               Core Components/Risk Factors/Patient Goals at Discharge (Final Review):   Goals and Risk Factor Review - 10/28/20 1434       Core Components/Risk Factors/Patient Goals Review   Personal Goals Review Weight Management/Obesity;Diabetes    Review Patient has completed 11 sessions maintaining his weight since last 30 day review. He is doing well in the program with progressions and consistent attendance. He says he is getting stronger. He puts out great effort during the sessions. His personal goals for the program are to get stronger; improve his lung function; and be able to get back to work. We will continue to monitor his progress as he works towards meeting these goals.    Expected Outcomes Patient will complete the program meeting both personal and program goals.             ITP Comments:   Comments: ITP REVIEW Pt is making expected progress toward Cardiac Rehab goals after completing 14 sessions. Recommend continued exercise, life style modification, education, and increased stamina and strength.

## 2020-11-08 ENCOUNTER — Encounter (HOSPITAL_COMMUNITY)
Admission: RE | Admit: 2020-11-08 | Discharge: 2020-11-08 | Disposition: A | Discharge status: Home / Self Care | Payer: BC Managed Care – PPO | Source: Ambulatory Visit | Attending: Surgery | Admitting: Surgery

## 2020-11-08 ENCOUNTER — Other Ambulatory Visit: Payer: Self-pay

## 2020-11-08 DIAGNOSIS — Z951 Presence of aortocoronary bypass graft: Secondary | ICD-10-CM

## 2020-11-08 NOTE — Progress Notes (Signed)
Daily Session Note  Patient Details  Name: Daniel Carey MRN: [id number] Date of Birth: 1951/01/16 Referring Provider:   Flowsheet Row CARDIAC REHAB PHASE II ORIENTATION from 09/26/2020 in [place]  Referring Provider Dr. Acie Fredrickson       Encounter Date: 11/08/2020  Check In:  Session Check In - 11/08/20 1444       Check-In   Supervising physician immediately available to respond to emergencies [facility] MD immediately available    Physician(s) Dr. Domenic Polite    Location [facility]    Staff Present Aundra Dubin, RN, BSN;Other    Virtual Visit No    Medication changes reported     No    Fall or balance concerns reported    No    Tobacco Cessation No Change    Warm-up and Cool-down Performed as group-led instruction    Resistance Training Performed Yes    VAD Patient? No      Pain Assessment   Currently in Pain? No/denies    Pain Score 0-No pain    Multiple Pain Sites No             Capillary Blood Glucose: No results found for this or any previous visit (from the past 24 hour(s)).    Social History   Tobacco Use  Smoking Status Former   Packs/day: 0.50   Years: 40.00   Pack years: 20.00   Types: Cigarettes   Quit date: 08/25/2011   Years since quitting: 9.2  Smokeless Tobacco Never    Goals Met:  Independence with exercise equipment Exercise tolerated well No report of cardiac concerns or symptoms Strength training completed today  Goals Unmet:  Not Applicable  Comments: Check out 1645.   Dr. Kathie Dike is Medical Director for [facility].

## 2020-11-10 ENCOUNTER — Encounter: Payer: Self-pay | Admitting: Cardiovascular Disease

## 2020-11-10 NOTE — Progress Notes (Signed)
[name] Date of Birth  July 18, 1950       [place] [address], [place] Daniel Carey, Daniel Carey  [id number]   [place], [place]  [address] [phone]     [phone]   Fax  [phone]    Fax [phone]  Problem List: 1. CAD - 2.5 x 38 mm Promus Premier DES in the distal RCA and a 2.75 x 38 mm Promus Premier DES in the mid vessel in an overlapping fashion. The distal stent was post-dilated with a 2.5 x 20 mm [place] balloon. The proximal portion of the stent was post-dilated with a 3.0 x 20 mm [place] balloon ( June 30, 2012) current: The care  2. Bradycardia after getting propafol for endoscopy. 3. Obstructive sleep apnea:   :  Daniel Carey is feeling well today.  He's been getting some exercise. He does yard work and walks on the treadmill occasionally. He had an episode of bradycardia after getting IV protocol for an endoscopy procedure. He was seen by [name]. His metoprolol dose was decreased. He has also had a bronchoscopy with [name]. He does not have any evidence of COPD.  Both he and his wife have stop smoking.  ( Aug 20, 2011)  Feb. 28, 2014:  He continues to have increasing chest p pain Dr. Edwinna Areola ain.  He has not been smoking.  The pains are associated with exertion, last 5-10 minutes, resolve with 1 SL NTG.   He's had a mildly elevated TSH for the past year or so and has had some fatigue.    his TSH level 6.8.      His medical doctor started Carey on Synthroid. He gradually has titrated up to 0.1 mg a day. Since that time he's had increasing episodes of chest pain. All so the house that taxes at the same time present all is doing the same time in the summer and not a [facility] Aug 29, 2012: We will show  Daniel Carey in February we performed a cardiac catheterization because of increasing chest pain. He was found to have sequential stenosis in his mid right coronary artery and had PCI of his right coronary artery ( 2.5 x  38 mm Promus Premier DES in the distal RCA and a 2.75 x 38 mm Promus Premier DES in the mid vessel in an overlapping fashion. The distal sten was post-dilated with a 2.5 x 20 mm [place] balloon. The proximal portion of the stent was post-dilated with a 3.0 x 20 mm [place] balloon)  He and his wife are concerned about whether or not the Plavix is active.  His generic plavix manufacturer has changed.   He would like to be tested.   October 14, 2012:  [name] has been having some occasional CP.  He has had some episodes of dyspnea while climbing up a hill playing golf.  This past Monday night, he had some chest pain - possible due to indigestions.  these  He had lost his NTG - he has refilled it now.    Nov. 10, 2014:  Daniel Carey is doing well.  He is working too much according to wife. he's had a few CP but not severe enough to take NTG.  Still has some dyspnea with climbing steps.   He had stenting in march 2014.  October 23, 2013:  Daniel Carey is doing well.   Playing golf.  Working in the yard.  Not  exercising as much.   He has lost about 6 lbs.  He had labs drawn today.     Jan. 7, 2016:  He has been waking up in the middle of the night and feels like he cant catch his  Breath. Still working out - on the treadmill twice this week and has done well.   Sept. 1, 2016:  Has had some short of breath. Does not get any exercise.  His CP is much better since starting CPAP .  Is having bloating  - thinks its due to the lipitor   July 19, 2015: Has had some shortness of breath.   With exertion . Happens occasionally but seem to be getting more frequently .  BP is elevated today  Has cut back on his salt .   Sept. 21, 2017:  [name] is seen today for follow up visit for his CAD Occasionally short of breath . Comes and goes .  BP is a little elevated today Has occasional CP  Able to do all that he wants to do   November 04, 2016: Still having some chest pain Not exertional ,  Seems very similar to his previous  episodes of angina prior to stenting .   July 31 , 2108  Daniel Carey is seen today for follow up of a recent cath.  He was found to have diffuse moderate - severe CAD - especially in the LAD  Was started on Imdur - is having a headache with that. Angina seems to be slightly  better.  He still has marked DOE - wife is very concerned about this .   is going to see pulmonary soon .  Sept. 24, 2018:  Daniel Carey is doing very well. His chest pain seemed to be better. Getting some exercise   A little bit more salt than usual over the weekend. His diastolic blood pressure today is 90.  Nov. 8, 2018:    Daniel Carey is seen today  Has worsening dyspnea.   Chest tightness Also had a bladder infection  - burning with urination. ,   Fevers, chills.   Temp of 99.8 Still having fevers and chills  His dyspnea seems to be worse with his chills.  No blood in urine,   extremy dysuria and frequency + cough , no hemoptysis, No pleuretic CP  No real angina   April 08, 2017:  Daniel Carey is seen back for follow up visit Has urosepsis when I last saw Carey ( UTI with bacteremia )  Treated with ABx Now is having dysuria .   Is seeing urologist tomorrow .   No serious angina .   December 23, 2017:  Daniel Carey is seen today for follow-up of his coronary artery disease. Had a colonoscopy  Complicated by a E.coli UTI when I last saw Carey  Rare episodes of CP   Sept. 2, 2020   Doing well Had a pacer placed a month ago  Feels beter Has some CP if he starts walks on the treadmill.   Is able to walk through it and usually does ok  Lasts for a minutes.  Last cath was July 2019.  Conclusions: Multivessel coronary artery disease, including diffuse calcified LAD disease of up to 60-70% (FFR 0.73), moderate to severe LCx and OM3 disease (FFR 0.83 at submaximal hyperemia), and moderate in-stent restenosis of mid RCA. Normal left ventricular filling pressure. Normal left ventricular systolic function.    Recommendations: Escalate antianginal therapy with addition of isosorbide mononitrate. Continue aggressive secondary  prevention, including dual antiplatelet and statin therapy. I will review the films with the intervention and cardiac surgery teams. Revascularization options include atherectomy and PCI to LAD (would require multiple stents) versus CABG.  He has found that if he takes an extra metoprolol about an hour before he exercises that his angina is much better controlled.  We will have Carey continue that dosing scheme.  June 22, 2019:  Occasional CP ,  Has some chest pressure while on the treadmill.   Rests for 5-10 min then the discomfort resolves Has not improved with takeing metoprolol before his workout.  He also notes he is having more shortness of breath.  Has cut his salt intake .  BP is elevated today .   Has not been elevated at home   His last heart catheterization was in 2018.  He was found to have diffuse coronary artery disease.  We increased medical therapy.  Tested + for Covid in January.   May still be recovering from that    Sept. 3, 2021: Seen for follow up of his CAD, HLD,  Pacer  S/p pacer for bradycardia  Still has some occasional episodes of CP when he is going to bed.  Atypical  No CP with activity  No pressure  Just pins and needles sensation    July 05, 2020 Hx of CAD, pacer, HLD Still having some CP on occasion .  Also has panic attacks  Has some numbness in his hands. Can walk on the treadmill.   Does ok as long as he has not eaten anything recently  Cp may be gradually getting worse.  Relieved with SL NTG  Is on metoprolol, imdur Is intol to amlodipine  Has these discomforts several times a week ,  He thinks is progressing   November 11, 2020:  Daniel Carey is seen for follow up of his CAD  He has severe 3 V cad  Had CABG April , 2022 Going to cardiac rehab.   At [facility]  Sternotomy looks good    Current Outpatient Medications on File  Prior to Visit  Medication Sig Dispense Refill   acetaminophen (TYLENOL) 500 MG tablet Take 1,000 mg by mouth every 6 (six) hours as needed (pain).      albuterol (VENTOLIN HFA) 108 (90 Base) MCG/ACT inhaler INHALE 2 PUFFS INTO THE LUNGS EVERY 6 HOURS AS NEEDED FOR WHEEZE OR SHORTNESS OF BREATH 8.5 each 3   aspirin EC 325 MG EC tablet Take 1 tablet (325 mg total) by mouth daily. 30 tablet 0   Choline Fenofibrate (FENOFIBRIC ACID) 135 MG CPDR Take 1 tablet by mouth daily. 30 capsule 11   famotidine (PEPCID) 20 MG tablet Take 20 mg by mouth daily as needed for heartburn.     hydrochlorothiazide (HYDRODIURIL) 25 MG tablet Take 0.5 tablets (12.5 mg total) by mouth daily as needed. 90 tablet 3   ipratropium (ATROVENT) 0.06 % nasal spray Place 2 sprays into both nostrils 4 (four) times daily. @ sprays at night daiyl as needed. 15 mL 5   metFORMIN (GLUCOPHAGE-XR) 500 MG 24 hr tablet Take 2 tablets (1,000 mg total) by mouth daily with supper. 180 tablet 3   metoprolol tartrate (LOPRESSOR) 25 MG tablet Take 0.5 tablets (12.5 mg total) by mouth 2 (two) times daily. 90 tablet 3   niacin 500 MG tablet Take 500 mg by mouth at bedtime.     nitroGLYCERIN (NITROSTAT) 0.4 MG SL tablet Place 1 tablet (0.4 mg total) under the  tongue every 5 (five) minutes as needed for chest pain. 25 tablet 6   RABEprazole (ACIPHEX) 20 MG tablet Take 1 tablet (20 mg total) by mouth daily in the afternoon. 30 tablet 11   rosuvastatin (CRESTOR) 20 MG tablet TAKE 1 TABLET BY MOUTH EVERY DAY 30 tablet 11   pantoprazole (PROTONIX) 40 MG tablet TAKE 1 TABLET BY MOUTH EVERY DAY (Patient not taking: Reported on 11/11/2020) 90 tablet 1   No current facility-administered medications on file prior to visit.    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    noted on EKG March 2013   CAD (coronary artery disease)    s/p atherectomy, PTCA/DES x2 mid & distal RCA 06/30/12   Carotid artery occlusion    Chest pain    Complication of anesthesia    heart rate dropping with propofol last procedure   COPD (chronic obstructive pulmonary disease) ([place])    Dyspnea    ED (erectile dysfunction)    Esophageal stricture    External hemorrhoids without mention of complication    GERD (gastroesophageal reflux disease)    Headache(784.0)    Hiatal hernia    History of kidney stones    HLD (hyperlipidemia)    HTN (hypertension)    Myocardial infarction ([place])    1996   Personal history of colonic polyps 03/12/2010   TUBULAR ADENOMA   Presence of permanent cardiac pacemaker    Tobacco abuse    Tubular adenoma of colon    Type 2 diabetes mellitus ([place])     Past Surgical History:  Procedure Laterality Date   ANGIOPLASTY     X3   BACK SURGERY     CARDIAC CATHETERIZATION     8 years ago stents   COLONOSCOPY N/A 02/16/2017   Procedure: COLONOSCOPY;  Surgeon: Jerene Bears, MD;  Location: [facility];  Service: Gastroenterology;  Laterality: N/A;   CORONARY ARTERY BYPASS GRAFT N/A 07/19/2020   Procedure: CORONARY ARTERY BYPASS GRAFTING (CABG) TIMES THREE USING LEFT INTERNAL MAMMARY ARTERY AND RIGHT GREATER SAPHENOUS VEIN HARVESTED ENDOSCOPICALLY;  Surgeon: Gaye Pollack, MD;  Location: [place];  Service: Open Heart Surgery;  Laterality: N/A;   INSERT / REPLACE / REMOVE PACEMAKER  06/20/2018   LEFT HEART CATH AND CORONARY ANGIOGRAPHY N/A 11/05/2016   Procedure: Left Heart Cath and Coronary Angiography;  Surgeon: Nelva Bush, MD;  Location: [place] [facility];  Service: Cardiovascular;  Laterality: N/A;   LEFT HEART CATH AND CORONARY ANGIOGRAPHY N/A  07/12/2020   Procedure: LEFT HEART CATH AND CORONARY ANGIOGRAPHY;  Surgeon: Burnell Blanks, MD;  Location: [place] CV [facility];  Service: Cardiovascular;  Laterality: N/A;   LUMBAR LAMINECTOMY/DECOMPRESSION MICRODISCECTOMY N/A 05/12/2018   Procedure: Microlumbar decompression L2-3, possible L1-2;  Surgeon: Susa Day, MD;  Location: [place];  Service: Orthopedics;  Laterality: N/A;  2 hrs, Microlumbar decompression L2-3, possible L1-2   PACEMAKER IMPLANT N/A 06/20/2018   Procedure: PACEMAKER IMPLANT;  Surgeon: Evans Lance, MD;  Location:  [place] INVASIVE CV LAB;  Service: Cardiovascular;  Laterality: N/A;   PERCUTANEOUS CORONARY ROTOBLATOR INTERVENTION (PCI-R) N/A 06/30/2012   Procedure: PERCUTANEOUS CORONARY ROTOBLATOR INTERVENTION (PCI-R);  Surgeon: Burnell Blanks, MD;  Location: [facility] C[facility];  Service: Cardiovascular;  Laterality: N/A;   TEE WITHOUT CARDIOVERSION N/A 07/19/2020   Procedure: TRANSESOPHAGEAL ECHOCARDIOGRAM (TEE);  Surgeon: Gaye Pollack, MD;  Location: [place];  Service: Open Heart Surgery;  Laterality: N/A;   VIDEO BRONCHOSCOPY  08/13/2011   Procedure: VIDEO BRONCHOSCOPY WITHOUT FLUORO;  Surgeon: Tanda Rockers, MD;  Location: [facility];  Service: Cardiopulmonary;  Laterality: Bilateral;    Social History   Tobacco Use  Smoking Status Former   Packs/day: 0.50   Years: 40.00   Pack years: 20.00   Types: Cigarettes   Quit date: 08/25/2011   Years since quitting: 9.2  Smokeless Tobacco Never    Social History   Substance and Sexual Activity  Alcohol Use Not Currently    Family History  Problem Relation Age of Onset   Heart disease Father        hx of MI   Diabetes Mother        and brother    Kidney failure Mother    Breast cancer Sister    Colon cancer Neg Hx    Stomach cancer Neg Hx     Reviw of Systems:  Reviewed in the HPI.  All other systems are negative.   Physical Exam: Blood pressure 126/80, pulse 62, height '5\' 11"'$  (1.803 m), weight  184 lb (83.5 kg), SpO2 97 %.  GEN:  Well nourished, well developed in no acute distress HEENT: Normal NECK: No JVD; No carotid bruits LYMPHATICS: No lymphadenopathy CARDIAC: RRR , no murmurs, rubs, gallops RESPIRATORY:  Clear to auscultation without rales, wheezing or rhonchi  ABDOMEN: Soft, non-tender, non-distended MUSCULOSKELETAL:  No edema; No deformity  SKIN: Warm and dry NEUROLOGIC:  Alert and oriented x 3    ECG:       Assessment / Plan:   1. COVID :     no residual covid issues    2. CAD  -      s/p CABG .  Doing well .   Sternotomy is healing well Feeling much better.    3. Hyperlipidemia:       labs have been stable  Check lipids, ALT, bMP today    4.  Obstructive sleep apnea:         Return to see me in 1 year   Mertie Moores, MD  11/11/2020 2:32 PM    [facility] [address],  [address], [place]  [phone] Pager [phone] Phone: [phone]; Fax: [phone]

## 2020-11-11 ENCOUNTER — Other Ambulatory Visit: Payer: Self-pay

## 2020-11-11 ENCOUNTER — Ambulatory Visit (INDEPENDENT_AMBULATORY_CARE_PROVIDER_SITE_OTHER): Payer: BC Managed Care – PPO | Admitting: Cardiovascular Disease

## 2020-11-11 ENCOUNTER — Encounter (HOSPITAL_COMMUNITY): Payer: BC Managed Care – PPO

## 2020-11-11 ENCOUNTER — Encounter: Payer: Self-pay | Admitting: Cardiovascular Disease

## 2020-11-11 VITALS — BP 126/80 | HR 62 | Ht 71.0 in | Wt 184.0 lb

## 2020-11-11 DIAGNOSIS — Z951 Presence of aortocoronary bypass graft: Secondary | ICD-10-CM

## 2020-11-11 DIAGNOSIS — I25119 Atherosclerotic heart disease of native coronary artery with unspecified angina pectoris: Secondary | ICD-10-CM

## 2020-11-11 NOTE — Patient Instructions (Signed)
Medication Instructions:  Your physician recommends that you continue on your current medications as directed. Please refer to the Current Medication list given to you today.  *If you need a refill on your cardiac medications before your next appointment, please call your pharmacy*   Lab Work: TODAY: FLP, ALT, BMET If you have labs (blood work) drawn today and your tests are completely normal, you will receive your results only by: [place] (if you have MyChart) OR A paper copy in the mail If you have any lab test that is abnormal or we need to change your treatment, we will call you to review the results.  Follow-Up: At [facility], you and your health needs are our priority.  As part of our continuing mission to provide you with exceptional heart care, we have created designated Provider Care Teams.  These Care Teams include your primary Cardiologist (physician) and Advanced Practice Providers (APPs -  Physician Assistants and Nurse Practitioners) who all work together to provide you with the care you need, when you need it.  Your next appointment:   1 year(s)  The format for your next appointment:   In Person  Provider:   You may see Mertie Moores, MD or one of the following Advanced Practice Providers on your designated Care Team:   Melina Copa, PA-C Ermalinda Barrios, PA-C

## 2020-11-12 ENCOUNTER — Telehealth: Payer: Self-pay | Admitting: Nurse Practitioner

## 2020-11-12 LAB — LIPID PANEL
Chol/HDL Ratio: 3.2 ratio (ref 0.0–5.0)
Cholesterol, Total: 144 mg/dL (ref 100–199)
HDL: 45 mg/dL (ref >39)
LDL Chol Calc (NIH): 75 mg/dL (ref 0–99)
Triglycerides: 136 mg/dL (ref 0–149)
VLDL Cholesterol Cal: 24 mg/dL (ref 5–40)

## 2020-11-12 LAB — BASIC METABOLIC PANEL
BUN/Creatinine Ratio: 15 (ref 10–24)
BUN: 18 mg/dL (ref 8–27)
CO2: 26 mmol/L (ref 20–29)
Calcium: 9.6 mg/dL (ref 8.6–10.2)
Chloride: 104 mmol/L (ref 96–106)
Creatinine, Ser: 1.23 mg/dL (ref 0.76–1.27)
Glucose: 96 mg/dL (ref 65–99)
Potassium: 4.8 mmol/L (ref 3.5–5.2)
Sodium: 143 mmol/L (ref 134–144)
eGFR: 64 mL/min/{1.73_m2} (ref >59)

## 2020-11-12 LAB — ALT: ALT: 12 IU/L (ref 0–44)

## 2020-11-12 NOTE — Telephone Encounter (Signed)
-----   Message from Thayer Headings, MD sent at 11/12/2020 10:35 AM EDT ----- ALT is stable His Lipids are much better.   LDL is 75 which is very close to goal. I would like to see it between 50-70.  Will he consider adding Zetia 10 mg a day ? Check lipids, ALT in 3 months

## 2020-11-12 NOTE — Telephone Encounter (Signed)
Reviewed results and plan of care with patient's wife per DPR. She reports that patient has been hesitant about taking additional medications but she will review the results and Dr. Elmarie Shiley advice with him. I asked her to have patient call back if he agrees to start Zetia. She thanked me for the call.

## 2020-11-13 ENCOUNTER — Encounter (HOSPITAL_COMMUNITY)
Admission: RE | Admit: 2020-11-13 | Discharge: 2020-11-13 | Disposition: A | Discharge status: Home / Self Care | Payer: BC Managed Care – PPO | Source: Ambulatory Visit | Attending: Surgery | Admitting: Surgery

## 2020-11-13 ENCOUNTER — Other Ambulatory Visit: Payer: Self-pay

## 2020-11-13 DIAGNOSIS — Z951 Presence of aortocoronary bypass graft: Secondary | ICD-10-CM

## 2020-11-13 NOTE — Progress Notes (Signed)
Daily Session Note  Patient Details  Name: Daniel Carey MRN: [id number] Date of Birth: 30-Jan-1951 Referring Provider:   Flowsheet Row CARDIAC REHAB PHASE II ORIENTATION from 09/26/2020 in [place]  Referring Provider Dr. Acie Fredrickson       Encounter Date: 11/13/2020  Check In:  Session Check In - 11/13/20 1445       Check-In   Supervising physician immediately available to respond to emergencies [facility] MD immediately available    Physician(s) Dr. Harl Bowie    Location [facility]    Staff Present Geanie Cooley, RN;Dalton Fletcher, MS, ACSM-CEP, Exercise Physiologist    Virtual Visit No    Medication changes reported     No    Fall or balance concerns reported    No    Tobacco Cessation No Change    Warm-up and Cool-down Performed as group-led instruction    Resistance Training Performed Yes    VAD Patient? No    PAD/SET Patient? No      Pain Assessment   Currently in Pain? No/denies    Pain Score 0-No pain    Multiple Pain Sites No             Capillary Blood Glucose: No results found for this or any previous visit (from the past 24 hour(s)).    Social History   Tobacco Use  Smoking Status Former   Packs/day: 0.50   Years: 40.00   Pack years: 20.00   Types: Cigarettes   Quit date: 08/25/2011   Years since quitting: 9.2  Smokeless Tobacco Never    Goals Met:  Independence with exercise equipment Exercise tolerated well No report of cardiac concerns or symptoms Strength training completed today  Goals Unmet:  Not Applicable  Comments: check out @ 3:45pm   Dr. Kathie Dike is Medical Director for [facility] Pulmonary Rehab.

## 2020-11-15 ENCOUNTER — Other Ambulatory Visit: Payer: Self-pay

## 2020-11-15 ENCOUNTER — Encounter (HOSPITAL_COMMUNITY)
Admission: RE | Admit: 2020-11-15 | Discharge: 2020-11-15 | Disposition: A | Discharge status: Home / Self Care | Payer: BC Managed Care – PPO | Source: Ambulatory Visit | Attending: Surgery | Admitting: Surgery

## 2020-11-15 DIAGNOSIS — Z951 Presence of aortocoronary bypass graft: Secondary | ICD-10-CM

## 2020-11-15 NOTE — Progress Notes (Signed)
Daily Session Note  Patient Details  Name: Daniel Carey MRN: [id number] Date of Birth: 05-03-1950 Referring Provider:   Flowsheet Row CARDIAC REHAB PHASE II ORIENTATION from 09/26/2020 in [place]  Referring Provider Dr. Acie Fredrickson       Encounter Date: 11/15/2020  Check In:  Session Check In - 11/15/20 1430       Check-In   Supervising physician immediately available to respond to emergencies [facility] MD immediately available    Physician(s) Dr. Harl Bowie    Location [facility]    Staff Present Geanie Cooley, RN;Dalton Kris Mouton, MS, ACSM-CEP, Exercise Physiologist    Virtual Visit No    Medication changes reported     No    Fall or balance concerns reported    No    Tobacco Cessation No Change    Warm-up and Cool-down Performed as group-led instruction    Resistance Training Performed Yes    VAD Patient? No    PAD/SET Patient? No      Pain Assessment   Currently in Pain? No/denies    Pain Score 0-No pain    Multiple Pain Sites No             Capillary Blood Glucose: No results found for this or any previous visit (from the past 24 hour(s)).    Social History   Tobacco Use  Smoking Status Former   Packs/day: 0.50   Years: 40.00   Pack years: 20.00   Types: Cigarettes   Quit date: 08/25/2011   Years since quitting: 9.2  Smokeless Tobacco Never    Goals Met:  Independence with exercise equipment Exercise tolerated well No report of cardiac concerns or symptoms Strength training completed today  Goals Unmet:  Not Applicable  Comments: check out @ 3:30pm   Dr. Kathie Dike is Medical Director for [facility] Pulmonary Rehab.

## 2020-11-18 ENCOUNTER — Other Ambulatory Visit: Payer: Self-pay

## 2020-11-18 ENCOUNTER — Encounter (HOSPITAL_COMMUNITY)
Admission: RE | Admit: 2020-11-18 | Discharge: 2020-11-18 | Disposition: A | Discharge status: Home / Self Care | Payer: BC Managed Care – PPO | Source: Ambulatory Visit | Attending: Surgery | Admitting: Surgery

## 2020-11-18 VITALS — Wt 183.9 lb

## 2020-11-18 DIAGNOSIS — Z951 Presence of aortocoronary bypass graft: Secondary | ICD-10-CM | POA: Insufficient documentation

## 2020-11-18 NOTE — Progress Notes (Signed)
Daily Session Note  Patient Details  Name: Daniel Carey MRN: [id number] Date of Birth: 1951-03-21 Referring Provider:   Flowsheet Row CARDIAC REHAB PHASE II ORIENTATION from 09/26/2020 in [place]  Referring Provider Dr. Acie Fredrickson       Encounter Date: 11/18/2020  Check In:  Session Check In - 11/18/20 1445       Check-In   Supervising physician immediately available to respond to emergencies [facility] MD immediately available    Physician(s) Dr. Domenic Polite    Location [facility]    Staff Present Geanie Cooley, RN;Dalton Kris Mouton, MS, ACSM-CEP, Exercise Physiologist;Debra Wynetta Emery, RN, BSN    Virtual Visit No    Medication changes reported     No    Fall or balance concerns reported    No    Tobacco Cessation No Change    Warm-up and Cool-down Performed as group-led instruction    Resistance Training Performed Yes    VAD Patient? No    PAD/SET Patient? No      Pain Assessment   Currently in Pain? No/denies    Pain Score 0-No pain    Multiple Pain Sites No             Capillary Blood Glucose: No results found for this or any previous visit (from the past 24 hour(s)).    Social History   Tobacco Use  Smoking Status Former   Packs/day: 0.50   Years: 40.00   Pack years: 20.00   Types: Cigarettes   Quit date: 08/25/2011   Years since quitting: 9.2  Smokeless Tobacco Never    Goals Met:  Independence with exercise equipment Exercise tolerated well No report of cardiac concerns or symptoms Strength training completed today  Goals Unmet:  Not Applicable  Comments: check out @ 3:45    Dr. Kathie Dike is Medical Director for [facility] Pulmonary Rehab.

## 2020-11-20 ENCOUNTER — Other Ambulatory Visit: Payer: Self-pay

## 2020-11-20 ENCOUNTER — Encounter (HOSPITAL_COMMUNITY)
Admission: RE | Admit: 2020-11-20 | Discharge: 2020-11-20 | Disposition: A | Discharge status: Home / Self Care | Payer: BC Managed Care – PPO | Source: Ambulatory Visit | Attending: Surgery | Admitting: Surgery

## 2020-11-20 VITALS — Ht 71.0 in | Wt 186.1 lb

## 2020-11-20 DIAGNOSIS — Z951 Presence of aortocoronary bypass graft: Secondary | ICD-10-CM

## 2020-11-20 NOTE — Progress Notes (Signed)
Daily Session Note  Patient Details  Name: ROCKWELL ZENTZ MRN: [id number] Date of Birth: 1951/02/10 Referring Provider:   Flowsheet Row CARDIAC REHAB PHASE II ORIENTATION from 09/26/2020 in [place]  Referring Provider Dr. Acie Fredrickson       Encounter Date: 11/20/2020  Check In:  Session Check In - 11/20/20 1445       Check-In   Supervising physician immediately available to respond to emergencies [facility] MD immediately available    Physician(s) Dr. Domenic Polite    Location [facility]    Staff Present Geanie Cooley, RN;Dalton Kris Mouton, MS, ACSM-CEP, Exercise Physiologist;Debra Wynetta Emery, RN, BSN    Virtual Visit No    Medication changes reported     No    Fall or balance concerns reported    No    Tobacco Cessation No Change    Warm-up and Cool-down Performed as group-led instruction    Resistance Training Performed Yes    VAD Patient? No    PAD/SET Patient? No      Pain Assessment   Currently in Pain? No/denies    Pain Score 0-No pain    Multiple Pain Sites No             Capillary Blood Glucose: No results found for this or any previous visit (from the past 24 hour(s)).    Social History   Tobacco Use  Smoking Status Former   Packs/day: 0.50   Years: 40.00   Pack years: 20.00   Types: Cigarettes   Quit date: 08/25/2011   Years since quitting: 9.2  Smokeless Tobacco Never    Goals Met:  Independence with exercise equipment Exercise tolerated well No report of cardiac concerns or symptoms Strength training completed today  Goals Unmet:  Not Applicable  Comments: check out @ 3:30pm   Dr. Kathie Dike is Medical Director for [facility] Pulmonary Rehab.

## 2020-11-21 DIAGNOSIS — X32XXXD Exposure to sunlight, subsequent encounter: Secondary | ICD-10-CM | POA: Diagnosis not present

## 2020-11-21 DIAGNOSIS — C44612 Basal cell carcinoma of skin of right upper limb, including shoulder: Secondary | ICD-10-CM | POA: Diagnosis not present

## 2020-11-21 DIAGNOSIS — D225 Melanocytic nevi of trunk: Secondary | ICD-10-CM | POA: Diagnosis not present

## 2020-11-21 DIAGNOSIS — C44311 Basal cell carcinoma of skin of nose: Secondary | ICD-10-CM | POA: Diagnosis not present

## 2020-11-21 DIAGNOSIS — Z1283 Encounter for screening for malignant neoplasm of skin: Secondary | ICD-10-CM | POA: Diagnosis not present

## 2020-11-21 DIAGNOSIS — L570 Actinic keratosis: Secondary | ICD-10-CM | POA: Diagnosis not present

## 2020-11-21 DIAGNOSIS — C44319 Basal cell carcinoma of skin of other parts of face: Secondary | ICD-10-CM | POA: Diagnosis not present

## 2020-11-22 ENCOUNTER — Other Ambulatory Visit: Payer: Self-pay

## 2020-11-22 ENCOUNTER — Encounter (HOSPITAL_COMMUNITY)
Admission: RE | Admit: 2020-11-22 | Discharge: 2020-11-22 | Disposition: A | Discharge status: Home / Self Care | Payer: BC Managed Care – PPO | Source: Ambulatory Visit | Attending: Surgery | Admitting: Surgery

## 2020-11-22 DIAGNOSIS — Z951 Presence of aortocoronary bypass graft: Secondary | ICD-10-CM | POA: Diagnosis not present

## 2020-11-22 NOTE — Progress Notes (Signed)
Daily Session Note  Patient Details  Name: KWAKU MOSTAFA MRN: [id number] Date of Birth: Nov 24, 1950 Referring Provider:   Flowsheet Row CARDIAC REHAB PHASE II ORIENTATION from 09/26/2020 in [place]  Referring Provider Dr. Acie Fredrickson       Encounter Date: 11/22/2020  Check In:  Session Check In - 11/22/20 1446       Check-In   Supervising physician immediately available to respond to emergencies [facility] MD immediately available    Physician(s) Dr. Harl Bowie    Location [facility]    Staff Present Geanie Cooley, RN;Dalton Kris Mouton, MS, ACSM-CEP, Exercise Physiologist;Debra Wynetta Emery, RN, BSN    Virtual Visit No    Medication changes reported     No    Fall or balance concerns reported    No    Tobacco Cessation No Change    Warm-up and Cool-down Performed as group-led instruction    Resistance Training Performed Yes    VAD Patient? No    PAD/SET Patient? No      Pain Assessment   Currently in Pain? No/denies    Pain Score 0-No pain    Multiple Pain Sites No             Capillary Blood Glucose: No results found for this or any previous visit (from the past 24 hour(s)).    Social History   Tobacco Use  Smoking Status Former   Packs/day: 0.50   Years: 40.00   Pack years: 20.00   Types: Cigarettes   Quit date: 08/25/2011   Years since quitting: 9.2  Smokeless Tobacco Never    Goals Met:  Independence with exercise equipment Exercise tolerated well No report of cardiac concerns or symptoms Strength training completed today  Goals Unmet:  Not Applicable  Comments: check out @ 3:45pm   Dr. Kathie Dike is Medical Director for [facility].

## 2020-11-25 ENCOUNTER — Encounter (HOSPITAL_COMMUNITY): Payer: BC Managed Care – PPO

## 2020-11-26 ENCOUNTER — Encounter: Payer: Self-pay | Admitting: Surgery

## 2020-11-27 ENCOUNTER — Encounter (HOSPITAL_COMMUNITY): Payer: BC Managed Care – PPO

## 2020-11-27 ENCOUNTER — Encounter: Payer: Self-pay | Admitting: *Deleted

## 2020-11-27 NOTE — Progress Notes (Signed)
Discharge Progress Report  Patient Details  Name: Daniel Carey MRN: [id number] Date of Birth: [birth year] Referring Provider:   Flowsheet Row CARDIAC REHAB PHASE II ORIENTATION from 09/26/2020 in [place]  Referring Provider Dr. Acie Fredrickson        Number of Visits: 21  Reason for Discharge:  Patient reached a stable level of exercise. Patient independent in their exercise. Patient has met program and personal goals.  Smoking History:  Social History   Tobacco Use  Smoking Status Former   Packs/day: 0.50   Years: 40.00   Pack years: 20.00   Types: Cigarettes   Quit date: 08/25/2011   Years since quitting: 9.2  Smokeless Tobacco Never    Diagnosis:  S/P CABG x 3  ADL [facility]SD:   Initial Exercise Prescription:  Initial Exercise Prescription - 09/26/20 1400       Date of Initial Exercise RX and Referring Provider   Date 09/26/20    Referring Provider Dr. Acie Fredrickson    Expected Discharge Date 12/20/20      Treadmill   MPH 2    Grade 0    Minutes 17      Recumbant Elliptical   Level 1    RPM 60    Minutes 22      Prescription Details   Frequency (times per week) 3    Duration Progress to 30 minutes of continuous aerobic without signs/symptoms of physical distress      Intensity   THRR 40-80% of Max Heartrate 61-122    Ratings of Perceived Exertion 11-13    Perceived Dyspnea 0-4      Resistance Training   Training Prescription Yes    Weight 3 lbs    Reps 10-15             Discharge Exercise Prescription (Final Exercise Prescription Changes):  Exercise Prescription Changes - 11/18/20 1600       Response to Exercise   Blood Pressure (Admit) 112/62    Blood Pressure (Exercise) 158/80    Blood Pressure (Exit) 120/58    Heart Rate (Admit) 71 bpm    Heart Rate (Exercise) 97 bpm    Heart Rate (Exit) 80 bpm    Rating of Perceived Exertion (Exercise) 12    Duration Continue with 30 min of aerobic exercise without signs/symptoms of  physical distress.    Intensity THRR unchanged      Progression   Progression Continue to progress workloads to maintain intensity without signs/symptoms of physical distress.      Resistance Training   Training Prescription Yes    Weight 5 lbs    Reps 10-15    Time 10 Minutes      Treadmill   MPH 2.7    Grade 2    Minutes 22    METs 3.81      Recumbant Elliptical   Level 3    RPM 67    Minutes 17    METs 5             Functional Capacity:  6 Minute Walk     Row Name 09/26/20 1422 11/20/20 1524       6 Minute Walk   Phase Initial Discharge    Distance 1500 feet 1700 feet    Distance Feet Change -- 200 ft    Walk Time 6 minutes 6 minutes    # of Rest Breaks 0 0    MPH 2.65 3.22    METS 3.16 3.9  RPE 12 12    VO2 Peak 11.05 13.66    Symptoms Yes (comment) No    Comments left hip pain 3/10 --    Resting HR 70 bpm 71 bpm    Resting BP 108/50 132/64    Resting Oxygen Saturation  96 % 96 %    Exercise Oxygen Saturation  during 6 min walk 94 % 95 %    Max Ex. HR 83 bpm 100 bpm    Max Ex. BP 134/80 144/80    2 Minute Post BP 112/78 128/80             Psychological, QOL, Others - Outcomes: PHQ 2/9: Depression screen [facility] 2/9 11/27/2020 09/26/2020 03/01/2020 12/02/2018  Decreased Interest 1 0 0 0  Down, Depressed, Hopeless 0 0 0 0  PHQ - 2 Score 1 0 0 0  Altered sleeping 0 2 1 -  Tired, decreased energy _0 -  Change in appetite 0 0 1 -  Feeling bad or failure about yourself  0 0 0 -  Trouble concentrating 0 0 0 -  Moving slowly or fidgety/restless 0 0 0 -  Suicidal thoughts 0 0 0 -  PHQ-9 Score _1 -  Difficult doing work/chores Not difficult at all Not difficult at all Not difficult at all -  Some recent data might be hidden    Quality of Life:  Quality of Life - 11/27/20 0728       Quality of Life Scores   Health/Function Pre 22.25 %    Health/Function Post 24.47 %    Health/Function % Change 9.98 %    Socioeconomic Pre 27 %     Socioeconomic Post 27.43 %    Socioeconomic % Change  1.59 %    Psych/Spiritual Pre 26.57 %    Psych/Spiritual Post 28.29 %    Psych/Spiritual % Change 6.47 %    Family Pre 27.6 %    Family Post 25.5 %    Family % Change -7.61 %    GLOBAL Pre 24.98 %    GLOBAL Post 26.03 %    GLOBAL % Change 4.2 %             Personal Goals: Goals established at orientation with interventions provided to work toward goal.  Personal Goals and Risk Factors at Admission - 09/26/20 1359       Core Components/Risk Factors/Patient Goals on Admission    Weight Management Weight Maintenance    Diabetes Yes    Intervention Provide education about signs/symptoms and action to take for hypo/hyperglycemia.;Provide education about proper nutrition, including hydration, and aerobic/resistive exercise prescription along with prescribed medications to achieve blood glucose in normal ranges: Fasting glucose 65-99 mg/dL    Expected Outcomes Short Term: Participant verbalizes understanding of the signs/symptoms and immediate care of hyper/hypoglycemia, proper foot care and importance of medication, aerobic/resistive exercise and nutrition plan for blood glucose control.;Long Term: Attainment of HbA1C < 7%.    Personal Goal Other Yes    Personal Goal Get stronger; improve lung function; be able to return to work.    Intervention Patient will attend CR 3 days/week and supplement with exersice at  home 2 days/week.    Expected Outcomes Patient will complete the program meeting both program and personal goals.              Personal Goals Discharge:  Goals and Risk Factor Review     Row Name 09/30/20 1457 10/28/20 1434 11/27/20 1436  Core Components/Risk Factors/Patient Goals Review   Personal Goals Review Weight Management/Obesity;Diabetes Weight Management/Obesity;Diabetes Weight Management/Obesity;Diabetes     Review Patient is new to the program starting today. He was referred to CR with CABGx3. He  has multiple risk factors for CAD and is participating in the program for risk modification. His personal goals for the program are to get stronger; improve his lung function; and get back to work. We will continue to monitor his progress as he works towards meeting these goals. Patient has completed 11 sessions maintaining his weight since last 30 day review. He is doing well in the program with progressions and consistent attendance. He says he is getting stronger. He puts out great effort during the sessions. His personal goals for the program are to get stronger; improve his lung function; and be able to get back to work. We will continue to monitor his progress as he works towards meeting these goals. Pt graduated after completing 21 sessions of cardiac rehab. His attendance was consistent and increased his six minute walk test distance by 200 ft. He graduated the program early in order to go back to work. He believes that he is strong enough to return.     Expected Outcomes Patient will complete the program meeting both personal and program goals. Patient will complete the program meeting both personal and program goals. Patient will continue to work towards their goals post discharge.              Exercise Goals and Review:  Exercise Goals     Row Name 09/26/20 1425 10/08/20 0819 11/04/20 1556         Exercise Goals   Increase Physical Activity Yes Yes Yes     Intervention Provide advice, education, support and counseling about physical activity/exercise needs.;Develop an individualized exercise prescription for aerobic and resistive training based on initial evaluation findings, risk stratification, comorbidities and participant's personal goals. Provide advice, education, support and counseling about physical activity/exercise needs.;Develop an individualized exercise prescription for aerobic and resistive training based on initial evaluation findings, risk stratification, comorbidities  and participant's personal goals. Provide advice, education, support and counseling about physical activity/exercise needs.;Develop an individualized exercise prescription for aerobic and resistive training based on initial evaluation findings, risk stratification, comorbidities and participant's personal goals.     Expected Outcomes Short Term: Attend rehab on a regular basis to increase amount of physical activity.;Long Term: Exercising regularly at least 3-5 days a week.;Long Term: Add in home exercise to make exercise part of routine and to increase amount of physical activity. Short Term: Attend rehab on a regular basis to increase amount of physical activity.;Long Term: Exercising regularly at least 3-5 days a week.;Long Term: Add in home exercise to make exercise part of routine and to increase amount of physical activity. Short Term: Attend rehab on a regular basis to increase amount of physical activity.;Long Term: Exercising regularly at least 3-5 days a week.;Long Term: Add in home exercise to make exercise part of routine and to increase amount of physical activity.     Increase Strength and Stamina Yes Yes Yes     Intervention Provide advice, education, support and counseling about physical activity/exercise needs.;Develop an individualized exercise prescription for aerobic and resistive training based on initial evaluation findings, risk stratification, comorbidities and participant's personal goals. Provide advice, education, support and counseling about physical activity/exercise needs.;Develop an individualized exercise prescription for aerobic and resistive training based on initial evaluation findings, risk stratification, comorbidities and participant's personal goals.  Provide advice, education, support and counseling about physical activity/exercise needs.;Develop an individualized exercise prescription for aerobic and resistive training based on initial evaluation findings, risk  stratification, comorbidities and participant's personal goals.     Expected Outcomes Short Term: Increase workloads from initial exercise prescription for resistance, speed, and METs.;Short Term: Perform resistance training exercises routinely during rehab and add in resistance training at home;Long Term: Improve cardiorespiratory fitness, muscular endurance and strength as measured by increased METs and functional capacity (6MWT) Short Term: Increase workloads from initial exercise prescription for resistance, speed, and METs.;Short Term: Perform resistance training exercises routinely during rehab and add in resistance training at home;Long Term: Improve cardiorespiratory fitness, muscular endurance and strength as measured by increased METs and functional capacity (6MWT) Short Term: Increase workloads from initial exercise prescription for resistance, speed, and METs.;Short Term: Perform resistance training exercises routinely during rehab and add in resistance training at home;Long Term: Improve cardiorespiratory fitness, muscular endurance and strength as measured by increased METs and functional capacity (6MWT)     Able to understand and use rate of perceived exertion (RPE) scale Yes Yes Yes     Intervention Provide education and explanation on how to use RPE scale Provide education and explanation on how to use RPE scale Provide education and explanation on how to use RPE scale     Expected Outcomes Short Term: Able to use RPE daily in rehab to express subjective intensity level;Long Term:  Able to use RPE to guide intensity level when exercising independently Short Term: Able to use RPE daily in rehab to express subjective intensity level;Long Term:  Able to use RPE to guide intensity level when exercising independently Short Term: Able to use RPE daily in rehab to express subjective intensity level;Long Term:  Able to use RPE to guide intensity level when exercising independently     Knowledge and  understanding of Target Heart Rate Range (THRR) Yes Yes Yes     Intervention Provide education and explanation of THRR including how the numbers were predicted and where they are located for reference Provide education and explanation of THRR including how the numbers were predicted and where they are located for reference Provide education and explanation of THRR including how the numbers were predicted and where they are located for reference     Expected Outcomes Short Term: Able to state/look up THRR;Long Term: Able to use THRR to govern intensity when exercising independently;Short Term: Able to use daily as guideline for intensity in rehab Short Term: Able to state/look up THRR;Long Term: Able to use THRR to govern intensity when exercising independently;Short Term: Able to use daily as guideline for intensity in rehab Short Term: Able to state/look up THRR;Long Term: Able to use THRR to govern intensity when exercising independently;Short Term: Able to use daily as guideline for intensity in rehab     Able to check pulse independently Yes Yes Yes     Intervention Provide education and demonstration on how to check pulse in carotid and radial arteries.;Review the importance of being able to check your own pulse for safety during independent exercise Provide education and demonstration on how to check pulse in carotid and radial arteries.;Review the importance of being able to check your own pulse for safety during independent exercise Provide education and demonstration on how to check pulse in carotid and radial arteries.;Review the importance of being able to check your own pulse for safety during independent exercise     Expected Outcomes Long Term: Able to check  pulse independently and accurately;Short Term: Able to explain why pulse checking is important during independent exercise Long Term: Able to check pulse independently and accurately;Short Term: Able to explain why pulse checking is important  during independent exercise Long Term: Able to check pulse independently and accurately;Short Term: Able to explain why pulse checking is important during independent exercise     Understanding of Exercise Prescription Yes Yes Yes     Intervention Provide education, explanation, and written materials on patient's individual exercise prescription Provide education, explanation, and written materials on patient's individual exercise prescription Provide education, explanation, and written materials on patient's individual exercise prescription     Expected Outcomes Short Term: Able to explain program exercise prescription;Long Term: Able to explain home exercise prescription to exercise independently Short Term: Able to explain program exercise prescription;Long Term: Able to explain home exercise prescription to exercise independently Short Term: Able to explain program exercise prescription;Long Term: Able to explain home exercise prescription to exercise independently              Exercise Goals Re-Evaluation:  Exercise Goals Re-Evaluation     Row Name 10/08/20 0819 11/04/20 1557           Exercise Goal Re-Evaluation   Exercise Goals Review Increase Physical Activity;Increase Strength and Stamina;Able to understand and use rate of perceived exertion (RPE) scale;Knowledge and understanding of Target Heart Rate Range (THRR);Able to check pulse independently;Understanding of Exercise Prescription Increase Physical Activity;Increase Strength and Stamina;Able to understand and use rate of perceived exertion (RPE) scale;Knowledge and understanding of Target Heart Rate Range (THRR);Able to check pulse independently;Understanding of Exercise Prescription      Comments Pt has completed 5 sessions of cardiac rehab. He is slowly gaining his strength back, as he has complained of incisional discomfort and weakness in his chest muscles since his CABG. His BP, especially his diastolic has been elevated  during exercise and sometimes at rest. His DBP reached 100 while exercising during his last visit. He is unsure if he wants to do the whole program, but said that he would like to do at least six weeks. I think he would benefit from the whole program, as he is already showing progress. He currently exercises at 3.7 METs on the elliptical. Will continue to monitor and progress as able. Pt has completed 14 sessions of cardiac rehab. His blood pressures have been much lower as of late, as they were concerningly high when he first entered the program. He is progressing well through the program and has been able to increase his workloads. He is currently exercising at 4.5 METs on the elliptical. Will continue to monitor and progress as able.      Expected Outcomes Through exercise at rehab and at home the patient will meet their goals. Through exercise at rehab and at home the patient will meet their goals.               Nutrition & Weight - Outcomes:  Pre Biometrics - 09/26/20 1426       Pre Biometrics   Height _0  (1.778 m)    Weight 183 lb 13.8 oz (83.4 kg)    Waist Circumference 41 inches    Hip Circumference 41 inches    Waist to Hip Ratio 1 %    BMI (Calculated) 26.38    Triceps Skinfold 11 mm    % Body Fat 26.1 %    Grip Strength 34.7 kg    Flexibility 12.5 in  Single Leg Stand 8.82 seconds             Post Biometrics - 11/20/20 1528        Post  Biometrics   Height _0  (1.803 m)    Weight 186 lb 1.1 oz (84.4 kg)    Waist Circumference 41 inches    Hip Circumference 41 inches    Waist to Hip Ratio 1 %    BMI (Calculated) 25.96    Triceps Skinfold 11 mm    % Body Fat 26.3 %    Grip Strength 40.1 kg    Flexibility 12.5 in    Single Leg Stand 45 seconds             Nutrition:  Nutrition Therapy & Goals - 09/26/20 1357       Personal Nutrition Goals   Comments Patient scored 28 on his diet assessment. Score discussed and handout provided and explained  regarding healthier choices and handouts on DM control. We provide 2 educational sessions regarding heart healthy nutrition with handouts and offer assistance with RD referral if patient is interested.      Intervention Plan   Intervention Nutrition handout(s) given to patient.             Nutrition Discharge:  Nutrition Assessments - 11/27/20 0729       MEDFICTS Scores   Pre Score 28    Post Score 16    Score Difference -12             Education Questionnaire Score:  Knowledge Questionnaire Score - 11/27/20 0729       Knowledge Questionnaire Score   Pre Score 22/24    Post Score 19/24             Goals reviewed with patient; copy given to patient. Pt graduated from cardiac rehab after 21 sessions. He had great attendance throughout and graduated early in order to return to work. His weight was stable while in the program. His blood pressure was running high at the beginning of the program, but normalized towards the end of his time. He was able to increase his 6MWT distance by 200 ft. He was able to increase his MET level from 3.2 to 4.9. He was able to decrease his PHQ-9 score from a 5 to a 2. He reports that he will continue to exercise at home, as he has a stationary bike and a treadmill.

## 2020-11-29 ENCOUNTER — Encounter (HOSPITAL_COMMUNITY): Payer: BC Managed Care – PPO

## 2020-12-02 ENCOUNTER — Encounter (HOSPITAL_COMMUNITY): Payer: BC Managed Care – PPO

## 2020-12-02 DIAGNOSIS — G4733 Obstructive sleep apnea (adult) (pediatric): Secondary | ICD-10-CM | POA: Diagnosis not present

## 2020-12-04 ENCOUNTER — Encounter (HOSPITAL_COMMUNITY): Payer: BC Managed Care – PPO

## 2020-12-06 ENCOUNTER — Encounter (HOSPITAL_COMMUNITY): Payer: BC Managed Care – PPO

## 2020-12-09 ENCOUNTER — Encounter (HOSPITAL_COMMUNITY): Payer: BC Managed Care – PPO

## 2020-12-10 ENCOUNTER — Other Ambulatory Visit: Payer: Self-pay | Admitting: Cardiovascular Disease

## 2020-12-11 ENCOUNTER — Encounter (HOSPITAL_COMMUNITY): Payer: BC Managed Care – PPO

## 2020-12-13 ENCOUNTER — Encounter (HOSPITAL_COMMUNITY): Payer: BC Managed Care – PPO

## 2020-12-16 ENCOUNTER — Encounter (HOSPITAL_COMMUNITY): Payer: BC Managed Care – PPO

## 2020-12-18 ENCOUNTER — Other Ambulatory Visit: Payer: Self-pay | Admitting: Cardiovascular Disease

## 2020-12-18 ENCOUNTER — Encounter (HOSPITAL_COMMUNITY): Payer: BC Managed Care – PPO

## 2020-12-20 ENCOUNTER — Encounter (HOSPITAL_COMMUNITY): Payer: BC Managed Care – PPO

## 2020-12-24 ENCOUNTER — Ambulatory Visit (INDEPENDENT_AMBULATORY_CARE_PROVIDER_SITE_OTHER): Payer: BC Managed Care – PPO

## 2020-12-24 DIAGNOSIS — I441 Atrioventricular block, second degree: Secondary | ICD-10-CM

## 2020-12-25 ENCOUNTER — Encounter: Payer: Self-pay | Admitting: Internal Medicine

## 2020-12-26 LAB — CUP PACEART REMOTE DEVICE CHECK
Battery Remaining Longevity: 78 mo
Battery Remaining Percentage: 75 %
Battery Voltage: 3.01 V
Brady Statistic AP VP Percent: 5.4 %
Brady Statistic AP VS Percent: 1 %
Brady Statistic AS VP Percent: 94 %
Brady Statistic AS VS Percent: 1 %
Brady Statistic RA Percent Paced: 5.1 %
Brady Statistic RV Percent Paced: 99 %
Date Time Interrogation Session: 20220907181726
Implantable Lead Implant Date: 20200302
Implantable Lead Implant Date: 20200302
Implantable Lead Location: 753859
Implantable Lead Location: 753860
Implantable Lead Model: 3830
Implantable Pulse Generator Implant Date: 20200302
Lead Channel Impedance Value: 460 Ohm
Lead Channel Impedance Value: 460 Ohm
Lead Channel Pacing Threshold Amplitude: 0.75 V
Lead Channel Pacing Threshold Amplitude: 0.75 V
Lead Channel Pacing Threshold Pulse Width: 0.5 ms
Lead Channel Pacing Threshold Pulse Width: 0.5 ms
Lead Channel Sensing Intrinsic Amplitude: 12 mV
Lead Channel Sensing Intrinsic Amplitude: 3.6 mV
Lead Channel Setting Pacing Amplitude: 2 V
Lead Channel Setting Pacing Amplitude: 2.5 V
Lead Channel Setting Pacing Pulse Width: 0.5 ms
Lead Channel Setting Sensing Sensitivity: 4 mV
Pulse Gen Model: 2272
Pulse Gen Serial Number: 9114136

## 2021-01-01 NOTE — Progress Notes (Signed)
Remote pacemaker transmission.   

## 2021-01-10 ENCOUNTER — Encounter: Payer: BC Managed Care – PPO | Admitting: Internal Medicine

## 2021-01-10 ENCOUNTER — Other Ambulatory Visit: Payer: Self-pay | Admitting: Cardiovascular Disease

## 2021-01-23 ENCOUNTER — Ambulatory Visit (INDEPENDENT_AMBULATORY_CARE_PROVIDER_SITE_OTHER): Payer: BC Managed Care – PPO

## 2021-01-23 ENCOUNTER — Other Ambulatory Visit: Payer: Self-pay

## 2021-01-23 DIAGNOSIS — Z23 Encounter for immunization: Secondary | ICD-10-CM

## 2021-02-06 ENCOUNTER — Telehealth: Payer: Self-pay | Admitting: *Deleted

## 2021-02-06 NOTE — Telephone Encounter (Signed)
Ok for [facility] 

## 2021-02-06 NOTE — Telephone Encounter (Signed)
Please review. This patient has several phone notes from his wife. Last colon was 2018 at [facility] because the patient did not want MAC. Last phone note wife wants at [facility] not hospital. S/P CABG 07/19/20, no blood thinner listed in chart. Pt has Pacemaker. [place] for [employer]? Please advise. Thank you, Jordi Lacko pv

## 2021-02-13 ENCOUNTER — Ambulatory Visit (INDEPENDENT_AMBULATORY_CARE_PROVIDER_SITE_OTHER): Payer: BC Managed Care – PPO | Admitting: Otolaryngology

## 2021-02-13 ENCOUNTER — Other Ambulatory Visit: Payer: Self-pay

## 2021-02-13 DIAGNOSIS — J310 Chronic rhinitis: Secondary | ICD-10-CM

## 2021-02-13 MED ORDER — IPRATROPIUM BROMIDE 0.06 % NA SOLN: 2.0000 | Freq: Four times a day (QID) | NASAL | 6 refills | Status: DC

## 2021-02-13 NOTE — Progress Notes (Signed)
HPI: Daniel Carey is a 70 y.o. male who returns today for evaluation of chronic runny nose.  He was last seen about a year and a half ago and was prescribed Atrovent 0.06% nasal spray that seemed to help the most with his nasal drainage..  Denies any difficulty breathing through his nose.  The nasal drainage is generally clear.  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    noted on EKG March 2013   CAD (coronary artery disease)    s/p atherectomy, PTCA/DES x2 mid & distal RCA 06/30/12   Carotid artery occlusion    Chest pain    Complication of anesthesia    heart rate dropping with propofol last procedure   COPD (chronic obstructive pulmonary disease) ([place])    Dyspnea    ED (erectile dysfunction)    Esophageal stricture    External hemorrhoids without mention of complication    GERD (gastroesophageal reflux disease)    Headache(784.0)    Hiatal hernia    History of kidney stones    HLD (hyperlipidemia)    HTN (hypertension)    Myocardial infarction ([place])    1996   Personal history of colonic polyps 03/12/2010   TUBULAR ADENOMA   Presence of permanent cardiac pacemaker    Tobacco abuse    Tubular adenoma of colon    Type 2 diabetes mellitus ([place])    Past Surgical History:  Procedure Laterality Date   ANGIOPLASTY     X3   BACK SURGERY     CARDIAC CATHETERIZATION     8 years ago stents   COLONOSCOPY N/A 02/16/2017   Procedure: COLONOSCOPY;  Surgeon: Jerene Bears, MD;  Location: [facility];  Service: Gastroenterology;  Laterality: N/A;   CORONARY ARTERY BYPASS GRAFT N/A 07/19/2020   Procedure: CORONARY ARTERY BYPASS GRAFTING (CABG) TIMES THREE USING LEFT INTERNAL MAMMARY ARTERY AND RIGHT GREATER SAPHENOUS VEIN HARVESTED ENDOSCOPICALLY;  Surgeon: Gaye Pollack, MD;  Location: [facility];  Service: Open Heart Surgery;  Laterality: N/A;   INSERT / REPLACE / REMOVE PACEMAKER  06/20/2018   LEFT HEART CATH AND CORONARY ANGIOGRAPHY N/A  11/05/2016   Procedure: Left Heart Cath and Coronary Angiography;  Surgeon: Nelva Bush, MD;  Location: [facility];  Service: Cardiovascular;  Laterality: N/A;   LEFT HEART CATH AND CORONARY ANGIOGRAPHY N/A 07/12/2020   Procedure: LEFT HEART CATH AND CORONARY ANGIOGRAPHY;  Surgeon: Burnell Blanks, MD;  Location: [facility];  Service: Cardiovascular;  Laterality: N/A;   LUMBAR LAMINECTOMY/DECOMPRESSION MICRODISCECTOMY N/A 05/12/2018   Procedure: Microlumbar decompression L2-3, possible L1-2;  Surgeon: Susa Day, MD;  Location: [facility];  Service: Orthopedics;  Laterality: N/A;  2 hrs, Microlumbar decompression L2-3, possible L1-2   PACEMAKER IMPLANT N/A 06/20/2018   Procedure: PACEMAKER IMPLANT;  Surgeon: Evans Lance, MD;  Location: [facility];  Service: Cardiovascular;  Laterality: N/A;   PERCUTANEOUS CORONARY ROTOBLATOR INTERVENTION (PCI-R) N/A 06/30/2012   Procedure: PERCUTANEOUS CORONARY ROTOBLATOR INTERVENTION (PCI-R);  Surgeon: Burnell Blanks, MD;  Location: [facility];  Service: Cardiovascular;  Laterality: N/A;   TEE WITHOUT CARDIOVERSION N/A 07/19/2020   Procedure: TRANSESOPHAGEAL ECHOCARDIOGRAM (TEE);  Surgeon: Gaye Pollack, MD;  Location: [facility];  Service: Open Heart Surgery;  Laterality: N/A;   VIDEO BRONCHOSCOPY  08/13/2011   Procedure: VIDEO BRONCHOSCOPY WITHOUT FLUORO;  Surgeon: Tanda Rockers, MD;  Location: [facility];  Service: Cardiopulmonary;  Laterality: Bilateral;   Social History   Socioeconomic History   Marital status: Married    Spouse name: Not on file   Number of children: 1   Years of education: Not on file   Highest education level: Not on file  Occupational History   Occupation: Radio broadcast assistant  Tobacco Use   Smoking status: Former    Packs/day: 0.50    Years: 40.00    Pack years: 20.00    Types: Cigarettes    Quit date: 08/25/2011    Years since quitting: 9.4   Smokeless tobacco: Never  Vaping Use    Vaping Use: Never used  Substance and Sexual Activity   Alcohol use: Not Currently   Drug use: No   Sexual activity: Not Currently  Other Topics Concern   Not on file  Social History Narrative   Not on file   Social Determinants of Health   Financial Resource Strain: Not on file  Food Insecurity: Not on file  Transportation Needs: Not on file  Physical Activity: Not on file  Stress: Not on file  Social Connections: Not on file   Family History  Problem Relation Age of Onset   Heart disease Father        hx of MI   Diabetes Mother        and brother    Kidney failure Mother    Breast cancer Sister    Colon cancer Neg Hx    Stomach cancer Neg Hx    Allergies  Allergen Reactions   Amlodipine Besylate Other (See Comments)    Headache, weakness, dizziness   Propofol Other (See Comments)    Heart rate dropped     Lisinopril Cough and Rash   Metoprolol     Lowered heart rate too much    Rosuvastatin Other (See Comments)     Corncern for muscle aches but he is tolerating now    Prior to Admission medications   Medication Sig Start Date End Date Taking? Authorizing Provider  acetaminophen (TYLENOL) 500 MG tablet Take 1,000 mg by mouth every 6 (six) hours as needed (pain).     [provider]  albuterol (VENTOLIN HFA) 108 (90 Base) MCG/ACT inhaler INHALE 2 PUFFS INTO THE LUNGS EVERY 6 HOURS AS NEEDED FOR WHEEZE OR SHORTNESS OF BREATH 10/15/20   Vivi Barrack, MD  aspirin EC 325 MG EC tablet Take 1 tablet (325 mg total) by mouth daily. 07/24/20   Antony Odea, PA-C  Choline Fenofibrate (FENOFIBRIC ACID) 135 MG CPDR TAKE 1 CAPSULE BY MOUTH EVERY DAY 01/10/21   Nahser, Wonda Cheng, MD  famotidine (PEPCID) 20 MG tablet Take 20 mg by mouth daily as needed for heartburn.    [provider]  hydrochlorothiazide (HYDRODIURIL) 25 MG tablet Take 0.5 tablets (12.5 mg total) by mouth daily as needed. 08/20/20 11/18/20  Evans Lance, MD  ipratropium (ATROVENT) 0.06 %  nasal spray Place 2 sprays into both nostrils 4 (four) times daily. @ sprays at night daiyl as needed. 09/24/20   Rozetta Nunnery, MD  metFORMIN (GLUCOPHAGE-XR) 500 MG 24 hr tablet Take 2 tablets (1,000 mg total) by mouth daily with supper. 11/01/20   Philemon Kingdom, MD  metoprolol tartrate (LOPRESSOR) 25 MG tablet Take 0.5 tablets (12.5 mg total) by mouth 2 (two) times daily. 08/20/20 11/18/20  Evans Lance, MD  niacin 500 MG tablet Take 500 mg by mouth at bedtime.    [provider]  nitroGLYCERIN (  NITROSTAT) 0.4 MG SL tablet Place 1 tablet (0.4 mg total) under the tongue every 5 (five) minutes as needed for chest pain. 08/04/19   Richardson Dopp T, PA-C  pantoprazole (PROTONIX) 40 MG tablet TAKE 1 TABLET BY MOUTH EVERY DAY Patient not taking: Reported on 11/11/2020 09/20/20   Vivi Barrack, MD  RABEprazole (ACIPHEX) 20 MG tablet Take 1 tablet (20 mg total) by mouth daily in the afternoon. 06/07/20   Vivi Barrack, MD  rosuvastatin (CRESTOR) 20 MG tablet TAKE 1 TABLET BY MOUTH EVERY DAY 12/10/20   Nahser, Wonda Cheng, MD     Positive ROS: Otherwise negative  All other systems have been reviewed and were otherwise negative with the exception of those mentioned in the HPI and as above.  Physical Exam: Constitutional: Alert, well-appearing, no acute distress Ears: External ears without lesions or tenderness. Ear canals are clear bilaterally with intact, clear TMs.  Nasal: External nose without lesions. Septum with minimal deformity and mild rhinitis.  Mucus within the nasal cavity is clear.  Both middle meatus regions were clear..  No clinical evidence of infection. Oral: Lips and gums without lesions. Tongue and palate mucosa without lesions. Posterior oropharynx clear. Neck: No palpable adenopathy or masses Respiratory: Breathing comfortably  Skin: No facial/neck lesions or rash noted.  Procedures  Assessment: Chronic vasomotor rhinitis  Plan: Refilled his Atrovent 0.06% and  sent the prescription to [facility] in [place].   Radene Journey, MD

## 2021-02-14 ENCOUNTER — Encounter: Payer: Self-pay | Admitting: Internal Medicine

## 2021-02-14 ENCOUNTER — Other Ambulatory Visit: Payer: Self-pay

## 2021-02-14 ENCOUNTER — Ambulatory Visit (AMBULATORY_SURGERY_CENTER): Payer: BC Managed Care – PPO | Admitting: *Deleted

## 2021-02-14 VITALS — Ht 71.0 in | Wt 185.0 lb

## 2021-02-14 DIAGNOSIS — Z86010 Personal history of colonic polyps: Secondary | ICD-10-CM

## 2021-02-14 MED ORDER — NA SULFATE-K SULFATE-MG SULF 17.5-3.13-1.6 GM/177ML PO SOLN
1.0000 | Freq: Once | ORAL | 0 refills | Status: AC
Start: 2021-02-14 — End: 2021-02-14

## 2021-02-14 NOTE — Progress Notes (Signed)
Pt states he hasn't taken Nitroglycerin "for a long time."    Pt states "my heart rate dropped  with Propofol,denies being told they were difficult to intubate, or hx/fam hx of malignant hyperthermia per pt  Pt's previsit is done over the phone and all paperwork (prep instructions, blank consent form to just read over) sent to patient.  Pt's name and DOB verified at the beginning of the previsit.  Pt denies any difficulty with ambulating.    No egg or soy allergy  No home oxygen use   No medications for weight loss taken  Pt denies constipation issues  Pt informed that we do not do prior authorizations for prep

## 2021-02-28 ENCOUNTER — Ambulatory Visit (AMBULATORY_SURGERY_CENTER): Payer: BC Managed Care – PPO | Admitting: Internal Medicine

## 2021-02-28 ENCOUNTER — Other Ambulatory Visit: Payer: Self-pay

## 2021-02-28 ENCOUNTER — Encounter: Payer: Self-pay | Admitting: Internal Medicine

## 2021-02-28 VITALS — BP 122/66 | HR 69 | Temp 95.9°F | Resp 14 | Ht 71.0 in | Wt 185.0 lb

## 2021-02-28 DIAGNOSIS — D125 Benign neoplasm of sigmoid colon: Secondary | ICD-10-CM | POA: Diagnosis not present

## 2021-02-28 DIAGNOSIS — D122 Benign neoplasm of ascending colon: Secondary | ICD-10-CM

## 2021-02-28 DIAGNOSIS — D127 Benign neoplasm of rectosigmoid junction: Secondary | ICD-10-CM | POA: Diagnosis not present

## 2021-02-28 DIAGNOSIS — D120 Benign neoplasm of cecum: Secondary | ICD-10-CM | POA: Diagnosis not present

## 2021-02-28 DIAGNOSIS — D128 Benign neoplasm of rectum: Secondary | ICD-10-CM

## 2021-02-28 DIAGNOSIS — Z86010 Personal history of colonic polyps: Secondary | ICD-10-CM

## 2021-02-28 DIAGNOSIS — D123 Benign neoplasm of transverse colon: Secondary | ICD-10-CM

## 2021-02-28 DIAGNOSIS — Z1211 Encounter for screening for malignant neoplasm of colon: Secondary | ICD-10-CM | POA: Diagnosis not present

## 2021-02-28 MED ORDER — SODIUM CHLORIDE 0.9 % IV SOLN: 500.0000 mL | Freq: Once | INTRAVENOUS | Status: DC

## 2021-02-28 NOTE — Op Note (Signed)
[place] Patient Name: Daniel Carey Procedure Date: 02/28/2021 9:36 AM MRN: [id number] Endoscopist: Jerene Bears , MD Age: 70 Referring MD:  Date of Birth: [birth year] Gender: Male Account #: [id number] Procedure:                Colonoscopy Indications:              High risk colon cancer surveillance: Personal                            history of multiple (3 or more) adenomas, Last                            colonoscopy: October 2018 Medicines:                Monitored Anesthesia Care Procedure:                Pre-Anesthesia Assessment:                           - Prior to the procedure, a History and Physical                            was performed, and patient medications and                            allergies were reviewed. The patient's tolerance of                            previous anesthesia was also reviewed. The risks                            and benefits of the procedure and the sedation                            options and risks were discussed with the patient.                            All questions were answered, and informed consent                            was obtained. Prior Anticoagulants: The patient has                            taken no previous anticoagulant or antiplatelet                            agents. ASA Grade Assessment: III - A patient with                            severe systemic disease. After reviewing the risks                            and benefits, the patient was deemed in  satisfactory condition to undergo the procedure.                           After obtaining informed consent, the colonoscope                            was passed under direct vision. Throughout the                            procedure, the patient's blood pressure, pulse, and                            oxygen saturations were monitored continuously. The                            #[id number] Olympus [id number] was introduced  through                            the anus and advanced to the cecum, identified by                            appendiceal orifice and ileocecal valve. The                            colonoscopy was performed without difficulty. The                            patient tolerated the procedure well. The quality                            of the bowel preparation was good. The ileocecal                            valve, appendiceal orifice, and rectum were                            photographed. Scope In: 9:39:48 AM Scope Out: 9:57:08 AM Scope Withdrawal Time: 0 hours 14 minutes 40 seconds  Total Procedure Duration: 0 hours 17 minutes 20 seconds  Findings:                 The digital rectal exam was normal.                           A 2 mm polyp was found in the cecum. The polyp was                            sessile. The polyp was removed with a cold biopsy                            forceps. Resection and retrieval were complete.                           Three sessile polyps were found in the ascending  colon. The polyps were 4 to 6 mm in size. These                            polyps were removed with a cold snare. Resection                            and retrieval were complete.                           A tattoo was seen in the proximal transverse colon.                            A post-polypectomy scar was found at the tattoo                            site. There was no evidence of residual polyp                            tissue.                           Three sessile polyps were found in the transverse                            colon. The polyps were 4 to 7 mm in size. These                            polyps were removed with a cold snare. Resection                            and retrieval were complete.                           A 5 mm polyp was found in the sigmoid colon. The                            polyp was sessile. The polyp was removed with a                             cold snare. Resection and retrieval were complete.                           A 3 mm polyp was found in the rectum. The polyp was                            sessile. The polyp was removed with a cold biopsy                            forceps. Resection and retrieval were complete.                           A few small-mouthed diverticula were found in the  sigmoid colon.                           Internal hemorrhoids were found during                            retroflexion. The hemorrhoids were small. Complications:            No immediate complications. Estimated Blood Loss:     Estimated blood loss was minimal. Impression:               - One 2 mm polyp in the cecum, removed with a cold                            biopsy forceps. Resected and retrieved.                           - Three 4 to 6 mm polyps in the ascending colon,                            removed with a cold snare. Resected and retrieved.                           - A tattoo was seen in the proximal transverse                            colon. A post-polypectomy scar was found at the                            tattoo site. There was no evidence of residual                            polyp tissue.                           - Three 4 to 7 mm polyps in the transverse colon,                            removed with a cold snare. Resected and retrieved.                           - One 5 mm polyp in the sigmoid colon, removed with                            a cold snare. Resected and retrieved.                           - One 3 mm polyp in the rectum, removed with a cold                            biopsy forceps. Resected and retrieved.                           - Diverticulosis in the sigmoid  colon.                           - Small internal hemorrhoids. Recommendation:           - Patient has a contact number available for                            emergencies. The signs and  symptoms of potential                            delayed complications were discussed with the                            patient. Return to normal activities tomorrow.                            Written discharge instructions were provided to the                            patient.                           - Resume previous diet.                           - Continue present medications.                           - Await pathology results.                           - Repeat colonoscopy is recommended for                            surveillance. The colonoscopy date will be                            determined after pathology results from today's                            exam become available for review. Jerene Bears, MD 02/28/2021 10:16:40 AM This report has been signed electronically.

## 2021-02-28 NOTE — Progress Notes (Signed)
Pt's states no medical or surgical changes since previsit or office visit. VS assessed by [facility] ?

## 2021-02-28 NOTE — Progress Notes (Signed)
GASTROENTEROLOGY PROCEDURE H&P NOTE   Primary Care Physician: Vivi Barrack, MD    Reason for Procedure:  History of colon polyps  Plan:    Colonoscopy  Patient is appropriate for endoscopic procedure(s) in the ambulatory ([place]) setting.  The nature of the procedure, as well as the risks, benefits, and alternatives were carefully and thoroughly reviewed with the patient. Ample time for discussion and questions allowed. The patient understood, was satisfied, and agreed to proceed.     HPI: Daniel Carey is a 70 y.o. male who presents for surveillance colonoscopy.  History of adenomatous colon polyps.  Last colonoscopy October 2018.  7 polyps removed.  Tolerated the prep.  No recent chest pain or shortness of breath.  No abdominal pain.  Medical history as below.  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    noted on EKG March 2013   CAD (coronary artery disease)    s/p atherectomy, PTCA/DES x2 mid & distal RCA 06/30/12   Carotid artery occlusion    Chest pain    Complication of anesthesia    heart rate dropping with propofol last procedure   COPD (chronic obstructive pulmonary disease) ([place])    Dyspnea    ED (erectile dysfunction)    Esophageal stricture    External hemorrhoids without mention of complication    GERD (gastroesophageal reflux disease)    Headache(784.0)    Hiatal hernia    History of kidney stones    HLD (hyperlipidemia)    HTN (hypertension)    Myocardial infarction ([place])    1996   Personal history of colonic polyps 03/12/2010   TUBULAR ADENOMA   Presence of permanent cardiac pacemaker    Sleep apnea    wears CPAP   Tobacco abuse    Tubular adenoma of colon    Type 2 diabetes mellitus ([place])     Past Surgical History:  Procedure Laterality Date   ANGIOPLASTY     X3   BACK SURGERY     CARDIAC CATHETERIZATION     8 years ago stents   COLONOSCOPY N/A 02/16/2017   Procedure: COLONOSCOPY;   Surgeon: Jerene Bears, MD;  Location: [facility];  Service: Gastroenterology;  Laterality: N/A;   COLONOSCOPY     CORONARY ARTERY BYPASS GRAFT N/A 07/19/2020   Procedure: CORONARY ARTERY BYPASS GRAFTING (CABG) TIMES THREE USING LEFT INTERNAL MAMMARY ARTERY AND RIGHT GREATER SAPHENOUS VEIN HARVESTED ENDOSCOPICALLY;  Surgeon: Gaye Pollack, MD;  Location: [facility];  Service: Open Heart Surgery;  Laterality: N/A;   INSERT / REPLACE / REMOVE PACEMAKER  06/20/2018   LEFT HEART CATH AND CORONARY ANGIOGRAPHY N/A 11/05/2016   Procedure: Left Heart Cath and Coronary Angiography;  Surgeon: Nelva Bush, MD;  Location: [facility];  Service: Cardiovascular;  Laterality: N/A;   LEFT HEART CATH AND CORONARY ANGIOGRAPHY N/A 07/12/2020   Procedure: LEFT HEART CATH AND CORONARY ANGIOGRAPHY;  Surgeon: Burnell Blanks, MD;  Location: [facility];  Service: Cardiovascular;  Laterality: N/A;   LUMBAR LAMINECTOMY/DECOMPRESSION MICRODISCECTOMY N/A 05/12/2018   Procedure: Microlumbar decompression L2-3, possible L1-2;  Surgeon: Susa Day, MD;  Location: [facility];  Service: Orthopedics;  Laterality: N/A;  2 hrs, Microlumbar decompression L2-3, possible L1-2   PACEMAKER IMPLANT N/A 06/20/2018   Procedure: PACEMAKER IMPLANT;  Surgeon: Evans Lance, MD;  Location: [facility];  Service: Cardiovascular;  Laterality: N/A;  PERCUTANEOUS CORONARY ROTOBLATOR INTERVENTION (PCI-R) N/A 06/30/2012   Procedure: PERCUTANEOUS CORONARY ROTOBLATOR INTERVENTION (PCI-R);  Surgeon: Burnell Blanks, MD;  Location: [facility];  Service: Cardiovascular;  Laterality: N/A;   TEE WITHOUT CARDIOVERSION N/A 07/19/2020   Procedure: TRANSESOPHAGEAL ECHOCARDIOGRAM (TEE);  Surgeon: Gaye Pollack, MD;  Location: [facility];  Service: Open Heart Surgery;  Laterality: N/A;   VIDEO BRONCHOSCOPY  08/13/2011   Procedure: VIDEO BRONCHOSCOPY WITHOUT FLUORO;  Surgeon: Tanda Rockers, MD;  Location: [facility];   Service: Cardiopulmonary;  Laterality: Bilateral;    Prior to Admission medications   Medication Sig Start Date End Date Taking? Authorizing Provider  acetaminophen (TYLENOL) 500 MG tablet Take 1,000 mg by mouth every 6 (six) hours as needed (pain).    Yes [provider]  aspirin EC 325 MG EC tablet Take 1 tablet (325 mg total) by mouth daily. 07/24/20  Yes Roddenberry, Arlis Porta, PA-C  Choline Fenofibrate (FENOFIBRIC ACID) 135 MG CPDR TAKE 1 CAPSULE BY MOUTH EVERY DAY 01/10/21  Yes Nahser, Wonda Cheng, MD  hydrochlorothiazide (HYDRODIURIL) 25 MG tablet Take 0.5 tablets (12.5 mg total) by mouth daily as needed. 08/20/20 02/28/21 Yes Evans Lance, MD  metoprolol tartrate (LOPRESSOR) 25 MG tablet Take 0.5 tablets (12.5 mg total) by mouth 2 (two) times daily. 08/20/20 02/28/21 Yes Evans Lance, MD  RABEprazole (ACIPHEX) 20 MG tablet Take 1 tablet (20 mg total) by mouth daily in the afternoon. 06/07/20  Yes Vivi Barrack, MD  rosuvastatin (CRESTOR) 20 MG tablet TAKE 1 TABLET BY MOUTH EVERY DAY 12/10/20  Yes Nahser, Wonda Cheng, MD  albuterol (VENTOLIN HFA) 108 (90 Base) MCG/ACT inhaler INHALE 2 PUFFS INTO THE LUNGS EVERY 6 HOURS AS NEEDED FOR WHEEZE OR SHORTNESS OF BREATH 10/15/20   Vivi Barrack, MD  famotidine (PEPCID) 20 MG tablet Take 20 mg by mouth daily as needed for heartburn. Patient not taking: Reported on 02/28/2021    [provider]  ipratropium (ATROVENT) 0.06 % nasal spray Place 2 sprays into both nostrils 4 (four) times daily. @ sprays at night daiyl as needed. 02/13/21   Rozetta Nunnery, MD  metFORMIN (GLUCOPHAGE-XR) 500 MG 24 hr tablet Take 2 tablets (1,000 mg total) by mouth daily with supper. Patient taking differently: Take 1,000 mg by mouth daily with supper. Pt states he "takes it until it makes me feel bad and then I stop for a few days" 11/01/20   Philemon Kingdom, MD  niacin 500 MG tablet Take 500 mg by mouth at bedtime. Patient not taking: No sig reported     [provider]  nitroGLYCERIN (NITROSTAT) 0.4 MG SL tablet Place 1 tablet (0.4 mg total) under the tongue every 5 (five) minutes as needed for chest pain. Patient not taking: No sig reported 08/04/19   Liliane Shi, PA-C    Current Outpatient Medications  Medication Sig Dispense Refill   acetaminophen (TYLENOL) 500 MG tablet Take 1,000 mg by mouth every 6 (six) hours as needed (pain).      aspirin EC 325 MG EC tablet Take 1 tablet (325 mg total) by mouth daily. 30 tablet 0   Choline Fenofibrate (FENOFIBRIC ACID) 135 MG CPDR TAKE 1 CAPSULE BY MOUTH EVERY DAY 90 capsule 3   hydrochlorothiazide (HYDRODIURIL) 25 MG tablet Take 0.5 tablets (12.5 mg total) by mouth daily as needed. 90 tablet 3   metoprolol tartrate (LOPRESSOR) 25 MG tablet Take 0.5 tablets (12.5 mg total) by mouth 2 (two) times daily. [address]  tablet 3   RABEprazole (ACIPHEX) 20 MG tablet Take 1 tablet (20 mg total) by mouth daily in the afternoon. 30 tablet 11   rosuvastatin (CRESTOR) 20 MG tablet TAKE 1 TABLET BY MOUTH EVERY DAY 90 tablet 3   albuterol (VENTOLIN HFA) 108 (90 Base) MCG/ACT inhaler INHALE 2 PUFFS INTO THE LUNGS EVERY 6 HOURS AS NEEDED FOR WHEEZE OR SHORTNESS OF BREATH 8.5 each 3   famotidine (PEPCID) 20 MG tablet Take 20 mg by mouth daily as needed for heartburn. (Patient not taking: Reported on 02/28/2021)     ipratropium (ATROVENT) 0.06 % nasal spray Place 2 sprays into both nostrils 4 (four) times daily. @ sprays at night daiyl as needed. 15 mL 6   metFORMIN (GLUCOPHAGE-XR) 500 MG 24 hr tablet Take 2 tablets (1,000 mg total) by mouth daily with supper. (Patient taking differently: Take 1,000 mg by mouth daily with supper. Pt states he "takes it until it makes me feel bad and then I stop for a few days") 180 tablet 3   niacin 500 MG tablet Take 500 mg by mouth at bedtime. (Patient not taking: No sig reported)     nitroGLYCERIN (NITROSTAT) 0.4 MG SL tablet Place 1 tablet (0.4 mg total) under the tongue every 5  (five) minutes as needed for chest pain. (Patient not taking: No sig reported) 25 tablet 6   Current Facility-Administered Medications  Medication Dose Route Frequency Provider Last Rate Last Admin   0.9 %  sodium chloride infusion  500 mL Intravenous Once Auriah Hollings, Lajuan Lines, MD        Allergies as of 02/28/2021 - Review Complete 02/28/2021  Allergen Reaction Noted   Amlodipine besylate Other (See Comments) 12/30/2006   Propofol Other (See Comments) 06/28/2012   Lisinopril Cough and Rash 12/05/2010   Metoprolol  06/14/2018   Rosuvastatin Other (See Comments) 02/13/2010    Family History  Problem Relation Age of Onset   Diabetes Mother        and brother    Kidney failure Mother    Heart disease Father        hx of MI   Breast cancer Sister    Colon cancer Neg Hx    Stomach cancer Neg Hx    Esophageal cancer Neg Hx    Rectal cancer Neg Hx     Social History   Socioeconomic History   Marital status: Married    Spouse name: Not on file   Number of children: 1   Years of education: Not on file   Highest education level: Not on file  Occupational History   Occupation: Radio broadcast assistant  Tobacco Use   Smoking status: Former    Packs/day: 0.50    Years: 40.00    Pack years: 20.00    Types: Cigarettes    Quit date: 08/25/2011    Years since quitting: 9.5   Smokeless tobacco: Never  Vaping Use   Vaping Use: Never used  Substance and Sexual Activity   Alcohol use: Not Currently   Drug use: No   Sexual activity: Not Currently  Other Topics Concern   Not on file  Social History Narrative   Not on file   Social Determinants of Health   Financial Resource Strain: Not on file  Food Insecurity: Not on file  Transportation Needs: Not on file  Physical Activity: Not on file  Stress: Not on file  Social Connections: Not on file  Intimate Partner Violence: Not on file  Physical Exam: Vital signs in last 24 hours: @BP  132/64   Pulse 64   Temp (!) 95.9 F (35.5 C)  (Skin)   Ht 5\' 11"  (1.803 m)   Wt 185 lb (83.9 kg)   SpO2 97%   BMI 25.80 kg/m  GEN: NAD EYE: Sclerae anicteric ENT: MMM CV: Non-tachycardic Pulm: CTA b/l GI: Soft, NT/ND NEURO:  Alert & Oriented x 3   Zenovia Jarred, MD [facility]  02/28/2021 9:34 AM

## 2021-02-28 NOTE — Patient Instructions (Signed)
YOU HAD AN ENDOSCOPIC PROCEDURE TODAY AT [facility]:   Refer to the procedure report that was given to you for any specific questions about what was found during the examination.  If the procedure report does not answer your questions, please call your gastroenterologist to clarify.  If you requested that your care partner not be given the details of your procedure findings, then the procedure report has been included in a sealed envelope for you to review at your convenience later.  Read all of the handouts given to you by your recovery room nurse.  YOU SHOULD EXPECT: Some feelings of bloating in the abdomen. Passage of more gas than usual.  Walking can help get rid of the air that was put into your GI tract during the procedure and reduce the bloating. If you had a lower endoscopy (such as a colonoscopy or flexible sigmoidoscopy) you may notice spotting of blood in your stool or on the toilet paper. If you underwent a bowel prep for your procedure, you may not have a normal bowel movement for a few days.  Please Note:  You might notice some irritation and congestion in your nose or some drainage.  This is from the oxygen used during your procedure.  There is no need for concern and it should clear up in a day or so.  SYMPTOMS TO REPORT IMMEDIATELY:  Following lower endoscopy (colonoscopy or flexible sigmoidoscopy):  Excessive amounts of blood in the stool  Significant tenderness or worsening of abdominal pains  Swelling of the abdomen that is new, acute  Fever of 100F or higher    For urgent or emergent issues, a gastroenterologist can be reached at any hour by calling [phone]. Do not use MyChart messaging for urgent concerns.    DIET:  We do recommend a small meal at first, but then you may proceed to your regular diet.  Drink plenty of fluids but you should avoid alcoholic beverages for 24 hours.  ACTIVITY:  You should plan to take it easy for the rest of today  and you should NOT DRIVE or use heavy machinery until tomorrow (because of the sedation medicines used during the test).    FOLLOW UP: Our staff will call the number listed on your records 48-72 hours following your procedure to check on you and address any questions or concerns that you may have regarding the information given to you following your procedure. If we do not reach you, we will leave a message.  We will attempt to reach you two times.  During this call, we will ask if you have developed any symptoms of COVID 19. If you develop any symptoms (ie: fever, flu-like symptoms, shortness of breath, cough etc.) before then, please call [phone].  If you test positive for Covid 19 in the 2 weeks post procedure, please call and report this information to [place].    If any biopsies were taken you will be contacted by phone or by letter within the next 1-3 weeks.  Please call us at [phone] if you have not heard about the biopsies in 3 weeks.    SIGNATURES/CONFIDENTIALITY: You and/or your care partner have signed paperwork which will be entered into your electronic medical record.  These signatures attest to the fact that that the information above on your After Visit Summary has been reviewed and is understood.  Full responsibility of the confidentiality of this discharge information lies with you and/or your care-partner.

## 2021-02-28 NOTE — Progress Notes (Signed)
Called to room to assist during endoscopic procedure.  Patient ID and intended procedure confirmed with present staff. Received instructions for my participation in the procedure from the performing physician.  

## 2021-02-28 NOTE — Progress Notes (Signed)
Report to PACU, RN, vss, BBS= Clear.  

## 2021-03-03 DIAGNOSIS — G4733 Obstructive sleep apnea (adult) (pediatric): Secondary | ICD-10-CM | POA: Diagnosis not present

## 2021-03-04 ENCOUNTER — Telehealth: Payer: Self-pay

## 2021-03-04 ENCOUNTER — Telehealth: Payer: Self-pay | Admitting: *Deleted

## 2021-03-04 NOTE — Telephone Encounter (Signed)
First attempt, left VM.  

## 2021-03-04 NOTE — Telephone Encounter (Signed)
Second attempt follow up call to pt, lm on vm. 

## 2021-03-05 ENCOUNTER — Encounter: Payer: Self-pay | Admitting: Internal Medicine

## 2021-03-07 ENCOUNTER — Ambulatory Visit (INDEPENDENT_AMBULATORY_CARE_PROVIDER_SITE_OTHER): Payer: BC Managed Care – PPO | Admitting: Internal Medicine

## 2021-03-07 ENCOUNTER — Encounter: Payer: Self-pay | Admitting: Internal Medicine

## 2021-03-07 VITALS — BP 120/62 | HR 73 | Ht 71.0 in | Wt 189.4 lb

## 2021-03-07 DIAGNOSIS — E039 Hypothyroidism, unspecified: Secondary | ICD-10-CM

## 2021-03-07 DIAGNOSIS — E663 Overweight: Secondary | ICD-10-CM | POA: Diagnosis not present

## 2021-03-07 DIAGNOSIS — E1165 Type 2 diabetes mellitus with hyperglycemia: Secondary | ICD-10-CM

## 2021-03-07 LAB — POCT GLYCOSYLATED HEMOGLOBIN (HGB A1C): Hemoglobin A1C: 6.6 % — AB (ref 4.0–5.6)

## 2021-03-07 NOTE — Progress Notes (Signed)
Patient ID: Daniel Carey, male   DOB: Jan 06, 1951, 70 y.o.   MRN: [id number]  This visit occurred during the SARS-CoV-2 public health emergency.  Safety protocols were in place, including screening questions prior to the visit, additional usage of staff PPE, and extensive cleaning of exam room while observing appropriate contact time as indicated for disinfecting solutions.   HPI  Daniel Carey is a 70 y.o.-year-old male, returning for f/u for hypothyroidism, dx 2012 and DM2, diet-controlled, non-insulin-dependent, with complications (CAD - s/p CABG 07/2020).  Last visit 4 months ago.  Interim history: He had CABG and was in cardiac rehab at last visit.  He stopped PT since but continues to exercise.  He has good exercise tolerance. No increased urination, blurry vision, chest pain. He has nausea with 1000 mg Metformin ER at dinnertime.  Pt. has been dx with hypothyroidism in 2012 >> he was on levothyroxine up to 200 mcg in 2016 >> but did not feel well so he stopped.  Afterwards, his TFTs fluctuated but in the last 3 years they have been in the normal range.  Reviewed previous TFTs: Lab Results  Component Value Date   TSH 3.96 03/20/2020   TSH 3.83 08/22/2019   TSH 3.99 01/31/2018   TSH 4.50 08/02/2017   TSH 4.89 (H) 12/08/2016   TSH 4.13 03/06/2016   TSH 9.84 (H) 09/05/2015   TSH 0.23 (L) 05/07/2015   TSH 2.49 08/02/2014   TSH 4.695 (H) 10/26/2013   FREET4 0.75 03/20/2020   FREET4 0.80 01/31/2018   FREET4 0.65 08/02/2017   FREET4 0.83 12/08/2016   FREET4 0.67 03/06/2016   FREET4 0.66 09/05/2015   FREET4 1.25 05/07/2015   FREET4 0.79 08/02/2014    Lab Results  Component Value Date   T3FREE 3.0 03/20/2020   T3FREE 2.8 01/31/2018   T3FREE 2.6 08/02/2017   T3FREE 3.3 12/08/2016   T3FREE 3.1 03/06/2016   T3FREE 3.2 09/05/2015   T3FREE 3.9 05/07/2015   T3FREE 2.9 08/02/2014   T3FREE 2.8 07/20/2011   Pt denies: - feeling nodules in neck - hoarseness - dysphagia -  choking - SOB with lying down   DM2:  Reviewed HbA1c levels: Lab Results  Component Value Date   HGBA1C 6.2 (A) 11/01/2020   HGBA1C 7.3 (H) 07/18/2020   HGBA1C 6.5 (A) 03/20/2020  07/03/2016: HbA1c 6.8%  He was on: - Metformin 500 mg 2x a day >> stopped 2/2 nausea >> now metformin ER 1000 mg with dinner >> nausea  He continues to limit sweets and to not eat fried foods.  He checks his sugars 3x a day: - am: 95-168 >> 140, 159, 179 >> 120-140 >> 138-145, 147 - 2h after b'fast: n/c - lunch: 136 >> 135, 140-160 >> 80-150 >> 120s - 2h after lunch: 100 >> n/c - dinner: 120, 121 >> 150-174 >> 90-140 >> 120s - 2h after dinner: n/c - bedtime: n/c Feels sugars <80. Lowest: 92. Highest: 147.  Meter: ReliOn >> ReliOn Premier Blu  + CKD. Last BUN/Cr: Lab Results  Component Value Date   BUN 18 11/11/2020   BUN 22 07/23/2020   BUN 21 07/22/2020   CREATININE 1.23 11/11/2020   CREATININE 1.23 07/23/2020   CREATININE 1.21 07/22/2020  He is off ACE inhibitor/ARB due to history of high potassium.  Latest potassium levels have been normal: Lab Results  Component Value Date   K 4.8 11/11/2020   K 4.3 07/23/2020   K 4.0 07/22/2020   K 4.3 07/21/2020  K 4.2 07/20/2020   + HL: Lab Results  Component Value Date   CHOL 144 11/11/2020   HDL 45 11/11/2020   LDLCALC 75 11/11/2020   LDLDIRECT 107.0 02/27/2013   TRIG 136 11/11/2020   CHOLHDL 3.2 11/11/2020  On Crestor 20, Niacin, and fenofibrate.  Last eye exam in  2022: reportedly; No DR, no glaucoma, + incipient cataracts.  He sees Dr Acie Fredrickson >> had angioplasy x3 in the past, and 2 stents placed. He he has a history of an AMI 83 (mild). He has a Mobitz 1 AVB. Had a catheterization in 10/2016 >> partial blockages. He has OSA and wears a CPAP He had a colonoscopy 02/2017 >> E coli sepsis. He stays active playing golf, but not since his CABG.  ROS: + See HPI  Past Medical History:  Diagnosis Date   Arthritis    left wrist    AV block, Mobitz 1    noted on EKG March 2013   CAD (coronary artery disease)    s/p atherectomy, PTCA/DES x2 mid & distal RCA 06/30/12   Carotid artery occlusion    Chest pain    Complication of anesthesia    heart rate dropping with propofol last procedure   COPD (chronic obstructive pulmonary disease) ([place])    Dyspnea    ED (erectile dysfunction)    Esophageal stricture    External hemorrhoids without mention of complication    GERD (gastroesophageal reflux disease)    Headache(784.0)    Hiatal hernia    History of kidney stones    HLD (hyperlipidemia)    HTN (hypertension)    Myocardial infarction ([place])    1996   Personal history of colonic polyps 03/12/2010   TUBULAR ADENOMA   Presence of permanent cardiac pacemaker    Sleep apnea    wears CPAP   Tobacco abuse    Tubular adenoma of colon    Type 2 diabetes mellitus ([place])    Past Surgical History:  Procedure Laterality Date   ANGIOPLASTY     X3   BACK SURGERY     CARDIAC CATHETERIZATION     8 years ago stents   COLONOSCOPY N/A 02/16/2017   Procedure: COLONOSCOPY;  Surgeon: Jerene Bears, MD;  Location: [facility];  Service: Gastroenterology;  Laterality: N/A;   COLONOSCOPY     CORONARY ARTERY BYPASS GRAFT N/A 07/19/2020   Procedure: CORONARY ARTERY BYPASS GRAFTING (CABG) TIMES THREE USING LEFT INTERNAL MAMMARY ARTERY AND RIGHT GREATER SAPHENOUS VEIN HARVESTED ENDOSCOPICALLY;  Surgeon: Gaye Pollack, MD;  Location: [place];  Service: Open Heart Surgery;  Laterality: N/A;   INSERT / REPLACE / REMOVE PACEMAKER  06/20/2018   LEFT HEART CATH AND CORONARY ANGIOGRAPHY N/A 11/05/2016   Procedure: Left Heart Cath and Coronary Angiography;  Surgeon: Nelva Bush, MD;  Location: [place] [facility];  Service: Cardiovascular;  Laterality: N/A;   LEFT HEART CATH AND CORONARY ANGIOGRAPHY N/A 07/12/2020   Procedure: LEFT HEART CATH AND CORONARY ANGIOGRAPHY;  Surgeon: Burnell Blanks, MD;  Location: [place] [facility];  Service: Cardiovascular;  Laterality: N/A;   LUMBAR LAMINECTOMY/DECOMPRESSION MICRODISCECTOMY N/A 05/12/2018   Procedure: Microlumbar decompression L2-3, possible L1-2;  Surgeon: Susa Day, MD;  Location: [place];  Service: Orthopedics;  Laterality: N/A;  2 hrs, Microlumbar decompression L2-3, possible L1-2   PACEMAKER IMPLANT N/A 06/20/2018   Procedure: PACEMAKER IMPLANT;  Surgeon: Evans Lance, MD;  Location: [place] [facility];  Service: Cardiovascular;  Laterality: N/A;  PERCUTANEOUS CORONARY ROTOBLATOR INTERVENTION (PCI-R) N/A 06/30/2012   Procedure: PERCUTANEOUS CORONARY ROTOBLATOR INTERVENTION (PCI-R);  Surgeon: Burnell Blanks, MD;  Location: [facility];  Service: Cardiovascular;  Laterality: N/A;   TEE WITHOUT CARDIOVERSION N/A 07/19/2020   Procedure: TRANSESOPHAGEAL ECHOCARDIOGRAM (TEE);  Surgeon: Gaye Pollack, MD;  Location: [place];  Service: Open Heart Surgery;  Laterality: N/A;   VIDEO BRONCHOSCOPY  08/13/2011   Procedure: VIDEO BRONCHOSCOPY WITHOUT FLUORO;  Surgeon: Tanda Rockers, MD;  Location: [facility];  Service: Cardiopulmonary;  Laterality: Bilateral;   History   Social History   Marital Status: Married    Spouse Name: N/A    Number of Children: 1   Occupational History   Radio broadcast assistant    Social History Main Topics   Smoking status: Former Smoker -- 0.50 packs/day for 40 years    Types: Cigarettes    Quit date: 08/25/2011   Smokeless tobacco: Never Used   Alcohol Use: 0.0 oz/week     Comment: rare   Drug Use: No   Current Outpatient Medications on File Prior to Visit  Medication Sig Dispense Refill   acetaminophen (TYLENOL) 500 MG tablet Take 1,000 mg by mouth every 6 (six) hours as needed (pain).      albuterol (VENTOLIN HFA) 108 (90 Base) MCG/ACT inhaler INHALE 2 PUFFS INTO THE LUNGS EVERY 6 HOURS AS NEEDED FOR WHEEZE OR SHORTNESS OF BREATH 8.5 each 3   aspirin EC 325 MG EC tablet Take 1 tablet (325 mg total) by mouth daily.  30 tablet 0   Choline Fenofibrate (FENOFIBRIC ACID) 135 MG CPDR TAKE 1 CAPSULE BY MOUTH EVERY DAY 90 capsule 3   famotidine (PEPCID) 20 MG tablet Take 20 mg by mouth daily as needed for heartburn. (Patient not taking: Reported on 02/28/2021)     hydrochlorothiazide (HYDRODIURIL) 25 MG tablet Take 0.5 tablets (12.5 mg total) by mouth daily as needed. 90 tablet 3   ipratropium (ATROVENT) 0.06 % nasal spray Place 2 sprays into both nostrils 4 (four) times daily. @ sprays at night daiyl as needed. 15 mL 6   metFORMIN (GLUCOPHAGE-XR) 500 MG 24 hr tablet Take 2 tablets (1,000 mg total) by mouth daily with supper. (Patient taking differently: Take 1,000 mg by mouth daily with supper. Pt states he "takes it until it makes me feel bad and then I stop for a few days") 180 tablet 3   metoprolol tartrate (LOPRESSOR) 25 MG tablet Take 0.5 tablets (12.5 mg total) by mouth 2 (two) times daily. 90 tablet 3   niacin 500 MG tablet Take 500 mg by mouth at bedtime. (Patient not taking: No sig reported)     nitroGLYCERIN (NITROSTAT) 0.4 MG SL tablet Place 1 tablet (0.4 mg total) under the tongue every 5 (five) minutes as needed for chest pain. (Patient not taking: No sig reported) 25 tablet 6   RABEprazole (ACIPHEX) 20 MG tablet Take 1 tablet (20 mg total) by mouth daily in the afternoon. 30 tablet 11   rosuvastatin (CRESTOR) 20 MG tablet TAKE 1 TABLET BY MOUTH EVERY DAY 90 tablet 3   Current Facility-Administered Medications on File Prior to Visit  Medication Dose Route Frequency Provider Last Rate Last Admin   0.9 %  sodium chloride infusion  500 mL Intravenous Once Pyrtle, Lajuan Lines, MD       Allergies  Allergen Reactions   Amlodipine Besylate Other (See Comments)    Headache, weakness, dizziness   Propofol Other (See Comments)  Heart rate dropped     Lisinopril Cough and Rash   Metoprolol     Lowered heart rate too much    Rosuvastatin Other (See Comments)     Corncern for muscle aches but he is tolerating  now    Family History  Problem Relation Age of Onset   Diabetes Mother        and brother    Kidney failure Mother    Heart disease Father        hx of MI   Breast cancer Sister    Colon cancer Neg Hx    Stomach cancer Neg Hx    Esophageal cancer Neg Hx    Rectal cancer Neg Hx    PE: BP 120/62 (BP Location: Left Arm, Patient Position: Sitting, Cuff Size: Normal)   Pulse 73   Ht 5\' 11"  (1.803 m)   Wt 189 lb 6.4 oz (85.9 kg)   SpO2 95%   BMI 26.42 kg/m  Wt Readings from Last 3 Encounters:  03/07/21 189 lb 6.4 oz (85.9 kg)  02/28/21 185 lb (83.9 kg)  02/14/21 185 lb (83.9 kg)   Constitutional: Slightly overweight, in NAD Eyes: PERRLA, EOMI, no exophthalmos ENT: moist mucous membranes, no thyromegaly, no cervical lymphadenopathy Cardiovascular: RRR, No MRG Respiratory: CTA B Gastrointestinal: abdomen soft, NT, ND, BS+ Musculoskeletal: no deformities, strength intact in all 4 Skin: moist, warm, no rashes Neurological: no tremor with outstretched hands, DTR normal in all 4  ASSESSMENT: 1. Mild hypothyroidism  2. DM2, diet- controlled - + CAD, s/p CABG 07/19/2020  3.  Overweight  PLAN:  1. Patient with history of mild hypothyroidism, not on levothyroxine -Reviewed latest TFTs from 03/2020 and TSH was normal: Lab Results  Component Value Date   TSH 3.96 03/20/2020  - he would prefer to continue without treatment, if possible - He has good energy and no hypothyroid symptoms or signs -We will recheck his TFTs today  2. DM2 -Patient with well-controlled type 2 diabetes, but with worse control this spring, when an HbA1c returned at 7.3%.  At that time, sugars were elevated, above target so he was started on low-dose metformin in the hospital.  We increased the dose to 500 mg twice a day.  He had nausea with metformin so he stopped it before our last visit.  At last visit, HbA1c was much better, at 6.2%, but per review of his blood sugars at home they were occasionally  above target so we discussed about restarting metformin in the extended release formulation, to be better tolerated mostly to reduce variability of his blood sugars.  I advised him that he can take the entire dose of 1000 mg at night. -At today's visit, he tells me that he is taking but not likely related to nausea.  He did not try to split the dose into 2 doses.  At this visit, we discussed about trying to do so.  He agrees to start taking 500 mg with breakfast and 500 mg with dinner and see how he tolerated it.  I advised him that if this does not work well for him, to only take 500 mg with dinner. - I advised him to: Patient Instructions  Please split: - Metformin ER 500 mg 2x a day, with b'fast and dinner  Please stop at the lab.  Please return in 4 months with your sugar log.   - we checked his HbA1c: 6.6% (higher) - advised to check sugars at different times of the  day - 1x a day, rotating check times - advised for yearly eye exams >> he is UTD - return to clinic in 3-4 months  3.  Overweight -He continues to limit sweets and fried foods -Weight was stable at last visit, now gained 6 lbs  -At last visit we restarted metformin, which can also help with weight stabilization long-term  Component     Latest Ref Rng & Units 03/07/2021  TSH     0.450 - 4.500 uIU/mL 5.070 (H)  T4,Free(Direct)     0.82 - 1.77 ng/dL 1.04  Triiodothyronine,Free,Serum     2.0 - 4.4 pg/mL 2.7   TSH is slightly elevated.  The rest of the thyroid hormones are normal.  For now, we can continue to follow him expectantly and recheck the tests when he comes back.  Philemon Kingdom, MD PhD [facility] Endocrinology

## 2021-03-07 NOTE — Patient Instructions (Addendum)
Please split: - Metformin ER 500 mg 2x a day, with b'fast and dinner  Please stop at the lab.  Please return in 4 months with your sugar log.

## 2021-03-08 LAB — TSH: TSH: 5.07 u[IU]/mL — ABNORMAL HIGH (ref 0.450–4.500)

## 2021-03-08 LAB — T4, FREE: Free T4: 1.04 ng/dL (ref 0.82–1.77)

## 2021-03-08 LAB — T3, FREE: T3, Free: 2.7 pg/mL (ref 2.0–4.4)

## 2021-03-10 ENCOUNTER — Encounter: Payer: Self-pay | Admitting: Internal Medicine

## 2021-03-24 ENCOUNTER — Ambulatory Visit (INDEPENDENT_AMBULATORY_CARE_PROVIDER_SITE_OTHER): Payer: BC Managed Care – PPO | Admitting: Otolaryngology

## 2021-03-31 ENCOUNTER — Telehealth: Payer: Self-pay | Admitting: Internal Medicine

## 2021-03-31 NOTE — Telephone Encounter (Signed)
   1. Has your device fired?   2. Is you device beeping?   3. Are you experiencing draining or swelling at device site?   4. Are you calling to see if we received your device transmission? Pt is out of town and will not come home until Wednesday on 04/02/21, please r/s his transmission appt  5. Have you passed out?     Please route to [place]

## 2021-04-01 LAB — CUP PACEART REMOTE DEVICE CHECK
Battery Remaining Longevity: 77 mo
Battery Remaining Percentage: 73 %
Battery Voltage: 3.01 V
Brady Statistic AP VP Percent: 6.2 %
Brady Statistic AP VS Percent: 1 %
Brady Statistic AS VP Percent: 93 %
Brady Statistic AS VS Percent: 1 %
Brady Statistic RA Percent Paced: 5.8 %
Brady Statistic RV Percent Paced: 99 %
Date Time Interrogation Session: 20221209175216
Implantable Lead Implant Date: 20200302
Implantable Lead Implant Date: 20200302
Implantable Lead Location: 753859
Implantable Lead Location: 753860
Implantable Lead Model: 3830
Implantable Pulse Generator Implant Date: 20200302
Lead Channel Impedance Value: 490 Ohm
Lead Channel Impedance Value: 510 Ohm
Lead Channel Pacing Threshold Amplitude: 0.75 V
Lead Channel Pacing Threshold Amplitude: 0.75 V
Lead Channel Pacing Threshold Pulse Width: 0.5 ms
Lead Channel Pacing Threshold Pulse Width: 0.5 ms
Lead Channel Sensing Intrinsic Amplitude: 12 mV
Lead Channel Sensing Intrinsic Amplitude: 5 mV
Lead Channel Setting Pacing Amplitude: 2 V
Lead Channel Setting Pacing Amplitude: 2.5 V
Lead Channel Setting Pacing Pulse Width: 0.5 ms
Lead Channel Setting Sensing Sensitivity: 4 mV
Pulse Gen Model: 2272
Pulse Gen Serial Number: 9114136

## 2021-04-03 ENCOUNTER — Ambulatory Visit (INDEPENDENT_AMBULATORY_CARE_PROVIDER_SITE_OTHER): Payer: Medicare Other

## 2021-04-03 DIAGNOSIS — I441 Atrioventricular block, second degree: Secondary | ICD-10-CM | POA: Diagnosis not present

## 2021-04-10 ENCOUNTER — Other Ambulatory Visit: Payer: Self-pay | Admitting: Family Medicine

## 2021-04-15 NOTE — Progress Notes (Signed)
Remote pacemaker transmission.   

## 2021-06-23 ENCOUNTER — Encounter: Payer: Medicare Other | Admitting: Family Medicine

## 2021-06-25 ENCOUNTER — Ambulatory Visit (INDEPENDENT_AMBULATORY_CARE_PROVIDER_SITE_OTHER): Payer: Medicare Other | Admitting: Family Medicine

## 2021-06-25 ENCOUNTER — Encounter: Payer: Self-pay | Admitting: Family Medicine

## 2021-06-25 VITALS — BP 125/82 | HR 64 | Temp 97.6°F | Ht 71.0 in | Wt 189.2 lb

## 2021-06-25 DIAGNOSIS — R739 Hyperglycemia, unspecified: Secondary | ICD-10-CM | POA: Diagnosis not present

## 2021-06-25 DIAGNOSIS — I2510 Atherosclerotic heart disease of native coronary artery without angina pectoris: Secondary | ICD-10-CM

## 2021-06-25 DIAGNOSIS — Z0001 Encounter for general adult medical examination with abnormal findings: Secondary | ICD-10-CM | POA: Diagnosis not present

## 2021-06-25 DIAGNOSIS — R21 Rash and other nonspecific skin eruption: Secondary | ICD-10-CM

## 2021-06-25 DIAGNOSIS — E785 Hyperlipidemia, unspecified: Secondary | ICD-10-CM

## 2021-06-25 DIAGNOSIS — I10 Essential (primary) hypertension: Secondary | ICD-10-CM | POA: Diagnosis not present

## 2021-06-25 DIAGNOSIS — Z125 Encounter for screening for malignant neoplasm of prostate: Secondary | ICD-10-CM

## 2021-06-25 LAB — COMPREHENSIVE METABOLIC PANEL
ALT: 14 U/L (ref 0–53)
AST: 15 U/L (ref 0–37)
Albumin: 4.2 g/dL (ref 3.5–5.2)
Alkaline Phosphatase: 46 U/L (ref 39–117)
BUN: 22 mg/dL (ref 6–23)
CO2: 30 mEq/L (ref 19–32)
Calcium: 9.8 mg/dL (ref 8.4–10.5)
Chloride: 98 mEq/L (ref 96–112)
Creatinine, Ser: 1.2 mg/dL (ref 0.40–1.50)
GFR: 61.65 mL/min (ref >60.00)
Glucose, Bld: 122 mg/dL — ABNORMAL HIGH (ref 70–99)
Potassium: 4.6 mEq/L (ref 3.5–5.1)
Sodium: 136 mEq/L (ref 135–145)
Total Bilirubin: 0.7 mg/dL (ref 0.2–1.2)
Total Protein: 6.9 g/dL (ref 6.0–8.3)

## 2021-06-25 LAB — LIPID PANEL
Cholesterol: 148 mg/dL (ref 0–200)
HDL: 49.8 mg/dL (ref >39.00)
LDL Cholesterol: 70 mg/dL (ref 0–99)
NonHDL: 97.8
Total CHOL/HDL Ratio: 3
Triglycerides: 141 mg/dL (ref 0.0–149.0)
VLDL: 28.2 mg/dL (ref 0.0–40.0)

## 2021-06-25 LAB — TSH: TSH: 3.73 u[IU]/mL (ref 0.35–5.50)

## 2021-06-25 LAB — CBC
HCT: 43.2 % (ref 39.0–52.0)
Hemoglobin: 14.3 g/dL (ref 13.0–17.0)
MCHC: 33.2 g/dL (ref 30.0–36.0)
MCV: 91.2 fl (ref 78.0–100.0)
Platelets: 244 10*3/uL (ref 150.0–400.0)
RBC: 4.73 Mil/uL (ref 4.22–5.81)
RDW: 14.4 % (ref 11.5–15.5)
WBC: 5.2 10*3/uL (ref 4.0–10.5)

## 2021-06-25 LAB — PSA: PSA: 0.42 ng/mL (ref 0.10–4.00)

## 2021-06-25 LAB — HEMOGLOBIN A1C: Hgb A1c MFr Bld: 7 % — ABNORMAL HIGH (ref 4.6–6.5)

## 2021-06-25 MED ORDER — OMEPRAZOLE 40 MG PO CPDR: 40.0000 mg | DELAYED_RELEASE_CAPSULE | Freq: Every day | ORAL | 3 refills | Status: DC

## 2021-06-25 MED ORDER — IPRATROPIUM BROMIDE 0.06 % NA SOLN: 2.0000 | Freq: Four times a day (QID) | NASAL | 11 refills | Status: DC

## 2021-06-25 NOTE — Assessment & Plan Note (Signed)
Check A1c.  He is on metformin 1000 mg daily. ?

## 2021-06-25 NOTE — Patient Instructions (Signed)
It was very nice to see you today! ? ?We will check blood work today. ? ?I will refill your medications. ? ?Please come back to see me in a year for your next physical.  Come back to see me sooner if needed. ? ?Take care, ?Dr Jerline Pain ? ?PLEASE NOTE: ? ?If you had any lab tests please let us know if you have not heard back within a few days. You may see your results on mychart before we have a chance to review them but we will give you a call once they are reviewed by [place]. If we ordered any referrals today, please let us know if you have not heard from their office within the next week.  ? ?Please try these tips to maintain a healthy lifestyle: ? ?Eat at least 3 REAL meals and 1-2 snacks per day.  Aim for no more than 5 hours between eating.  If you eat breakfast, please do so within one hour of getting up.  ? ?Each meal should contain half fruits/vegetables, one quarter protein, and one quarter carbs (no bigger than a computer mouse) ? ?Cut down on sweet beverages. This includes juice, soda, and sweet tea.  ? ?Drink at least 1 glass of water with each meal and aim for at least 8 glasses per day ? ?Exercise at least 150 minutes every week.   ? ?Preventive Care Daniel Carey, Daniel Carey ?Preventive care refers to lifestyle choices and visits with your health care provider that can promote health and wellness. Preventive care visits are also called wellness exams. ?What can I expect for my preventive care visit? ?Counseling ?During your preventive care visit, your health care provider may ask about your: ?Medical history, including: ?Past medical problems. ?Family medical history. ?History of falls. ?Current health, including: ?Emotional well-being. ?Home life and relationship well-being. ?Sexual activity. ?Memory and ability to understand (cognition). ?Lifestyle, including: ?Alcohol, nicotine or tobacco, and drug use. ?Access to firearms. ?Diet, exercise, and sleep habits. ?Work and work Statistician. ?Sunscreen  use. ?Safety issues such as seatbelt and bike helmet use. ?Physical exam ?Your health care provider will check your: ?Height and weight. These may be used to calculate your BMI (body mass index). BMI is a measurement that tells if you are at a healthy weight. ?Waist circumference. This measures the distance around your waistline. This measurement also tells if you are at a healthy weight and may help predict your risk of certain diseases, such as type 2 diabetes and high blood pressure. ?Heart rate and blood pressure. ?Body temperature. ?Skin for abnormal spots. ?What immunizations do I need? ?Vaccines are usually given at various ages, according to a schedule. Your health care provider will recommend vaccines for you based on your age, medical history, and lifestyle or other factors, such as travel or where you work. ?What tests do I need? ?Screening ?Your health care provider may recommend screening tests for certain conditions. This may include: ?Lipid and cholesterol levels. ?Diabetes screening. This is done by checking your blood sugar (glucose) after you have not eaten for a while (fasting). ?Hepatitis C test. ?Hepatitis B test. ?HIV (human immunodeficiency virus) test. ?STI (sexually transmitted infection) testing, if you are at risk. ?Lung cancer screening. ?Colorectal cancer screening. ?Prostate cancer screening. ?Abdominal aortic aneurysm (AAA) screening. You may need this if you are a current or former smoker. ?Talk with your health care provider about your test results, treatment options, and if necessary, the need for more tests. ?Follow these instructions at home: ?  Eating and drinking ? ?Eat a diet that includes fresh fruits and vegetables, whole grains, lean protein, and low-fat dairy products. Limit your intake of foods with high amounts of sugar, saturated fats, and salt. ?Take vitamin and mineral supplements as recommended by your health care provider. ?Do not drink alcohol if your health care  provider tells you not to drink. ?If you drink alcohol: ?Limit how much you have to 0-2 drinks a day. ?Know how much alcohol is in your drink. In the [place] one drink equals one 12 oz bottle of beer (355 mL), one 5 oz glass of wine (148 mL), or one 1? oz glass of hard liquor (44 mL). ?Lifestyle ?Brush your teeth every morning and night with fluoride toothpaste. Floss one time each day. ?Exercise for at least 30 minutes 5 or more days each week. ?Do not use any products that contain nicotine or tobacco. These products include cigarettes, chewing tobacco, and vaping devices, such as e-cigarettes. If you need help quitting, ask your health care provider. ?Do not use drugs. ?If you are sexually active, practice safe sex. Use a condom or other form of protection to prevent STIs. ?Take aspirin only as told by your health care provider. Make sure that you understand how much to take and what form to take. Work with your health care provider to find out whether it is safe and beneficial for you to take aspirin daily. ?Ask your health care provider if you need to take a cholesterol-lowering medicine (statin). ?Find healthy ways to manage stress, such as: ?Meditation, yoga, or listening to music. ?Journaling. ?Talking to a trusted person. ?Spending time with friends and family. ?Safety ?Always wear your seat belt while driving or riding in a vehicle. ?Do not drive: ?If you have been drinking alcohol. Do not ride with someone who has been drinking. ?When you are tired or distracted. ?While texting. ?If you have been using any mind-altering substances or drugs. ?Wear a helmet and other protective equipment during sports activities. ?If you have firearms in your house, make sure you follow all gun safety procedures. ?Minimize exposure to UV radiation to reduce your risk of skin cancer. ?What's next? ?Visit your health care provider once a year for an annual wellness visit. ?Ask your health care provider how often you should have  your eyes and teeth checked. ?Stay up to date on all vaccines. ?This information is not intended to replace advice given to you by your health care provider. Make sure you discuss any questions you have with your health care provider. ?Document Revised: 10/02/2020 Document Reviewed: 10/02/2020 ?Elsevier Patient Education ? [place]. ? ?

## 2021-06-25 NOTE — Progress Notes (Signed)
Chief Complaint:  Daniel Carey is a 71 y.o. male who presents today for his annual comprehensive physical exam.    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  We will check labs today.  May consider trial of empiric Valtrex depending on results.  If still no clear etiology we will come back in for biopsy.  Chronic Problems Addressed Today: Hyperglycemia Check A1c.  He is on metformin 1000 mg daily.  CAD (coronary artery disease) Also cardiology.  Check lipids.  On Crestor 20 mg daily and aspirin 325 mg daily.  Essential hypertension At goal today on metoprolol tartrate 12.5 mg twice daily.  Check labs.  Hyperlipidemia Check labs.  Continue Crestor 10 mg daily.   Preventative Healthcare: Discussion of vaccine.  He declined pneumonia vaccine.  We will check labs today.  Up-to-date on colon cancer screening.  Patient Counseling(The following topics were reviewed and/or handout was given):  -Nutrition: Stressed importance of moderation in sodium/caffeine intake, saturated fat and cholesterol, caloric balance, sufficient intake of fresh fruits, vegetables, and fiber.  -Stressed the importance of regular exercise.   -Substance Abuse: Discussed cessation/primary prevention of tobacco, alcohol, or other drug use; driving or other dangerous activities under the influence; availability of treatment for abuse.   -Injury prevention: Discussed safety belts, safety helmets, smoke detector, smoking near bedding or upholstery.   -Sexuality: Discussed sexually transmitted diseases, partner selection, use of condoms, avoidance of unintended pregnancy and contraceptive alternatives.   -Dental health: Discussed importance of regular tooth brushing, flossing, and dental visits.  -Health maintenance and immunizations reviewed. Please refer to Health maintenance section.  Return to care in 1 year for next preventative visit.     Subjective:  HPI:  See A/P for status  of chronic conditions.  He is also concerned about intermittent issues with unusual sensation on bilateral legs.  This comes and goes.  Is been going on for several years.  Describes a sensation as an "uncomfortable" feeling whenever light touch is applied to certain areas.  This can occur in multiple areas in the lower extremities.  Last for several days and then goes away.  He will sometimes go quite a while without having any issues.  Recently is also start developing a rash on his buttocks.  This usually coincides with the symptoms in his legs.  Rash is itchy.  Feels like blisters.  Lifestyle Diet: Balanced.  Exercise: Likes treadmill and bicycle.   Depression screen PHQ 2/9 11/27/2020  Decreased Interest 1  Down, Depressed, Hopeless 0  PHQ - 2 Score 1  Altered sleeping 0  Tired, decreased energy 1  Change in appetite 0  Feeling bad or failure about yourself  0  Trouble concentrating 0  Moving slowly or fidgety/restless 0  Suicidal thoughts 0  PHQ-9 Score 2  Difficult doing work/chores Not difficult at all  Some recent data might be hidden    Health Maintenance Due  Topic Date Due   URINE MICROALBUMIN  Never done   Hepatitis C Screening  Never done   Zoster Vaccines- Shingrix (1 of 2) Never done   Pneumonia Vaccine 62+ Years old (2 - PCV) 07/19/2012   COVID-19 Vaccine (4 - Booster for Moderna series) 05/30/2020     ROS: Per HPI, otherwise a complete review of systems was negative.   PMH:  The following were reviewed and entered/updated in epic: Past Medical History:  Diagnosis Date   Arthritis    left wrist   AV block, Mobitz  1    noted on EKG March 2013   CAD (coronary artery disease)    s/p atherectomy, PTCA/DES x2 mid & distal RCA 06/30/12   Carotid artery occlusion    Chest pain    Complication of anesthesia    heart rate dropping with propofol last procedure   COPD (chronic obstructive pulmonary disease) (HCC)    Dyspnea    ED (erectile dysfunction)     Esophageal stricture    External hemorrhoids without mention of complication    GERD (gastroesophageal reflux disease)    Headache(784.0)    Hiatal hernia    History of kidney stones    HLD (hyperlipidemia)    HTN (hypertension)    Myocardial infarction ([place])    1996   Personal history of colonic polyps 03/12/2010   TUBULAR ADENOMA   Presence of permanent cardiac pacemaker    Sleep apnea    wears CPAP   Tobacco abuse    Tubular adenoma of colon    Type 2 diabetes mellitus ([place])    Patient Active Problem List   Diagnosis Date Noted   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   Hyperglycemia 08/22/2019   Pacemaker 06/22/2019   COPD mixed type ([place]) 12/02/2018   Spinal stenosis of lumbar region 05/12/2018   Obstructive sleep apnea 04/26/2014   Heart block 07/28/2011   Diverticulosis of colon (without mention of hemorrhage) 07/08/2011   History of colonic polyps 12/23/2010   HIATAL HERNIA 12/22/2007   Hyperlipidemia 12/30/2006   Essential hypertension 12/30/2006   CAD (coronary artery disease) 12/30/2006   GERD 12/30/2006   Past Surgical History:  Procedure Laterality Date   ANGIOPLASTY     X3   BACK SURGERY     CARDIAC CATHETERIZATION     8 years ago stents   COLONOSCOPY N/A 02/16/2017   Procedure: COLONOSCOPY;  Surgeon: Jerene Bears, MD;  Location: [facility];  Service: Gastroenterology;  Laterality: N/A;   COLONOSCOPY     CORONARY ARTERY BYPASS GRAFT N/A 07/19/2020   Procedure: CORONARY ARTERY BYPASS GRAFTING (CABG) TIMES THREE USING LEFT INTERNAL MAMMARY ARTERY AND RIGHT GREATER SAPHENOUS VEIN HARVESTED ENDOSCOPICALLY;  Surgeon: Gaye Pollack, MD;  Location: [place];  Service: Open Heart Surgery;  Laterality: N/A;   INSERT / REPLACE / REMOVE PACEMAKER  06/20/2018   LEFT HEART CATH AND CORONARY ANGIOGRAPHY N/A 11/05/2016   Procedure: Left Heart Cath and Coronary Angiography;  Surgeon: Nelva Bush, MD;  Location: [place] [facility];  Service: Cardiovascular;  Laterality: N/A;    LEFT HEART CATH AND CORONARY ANGIOGRAPHY N/A 07/12/2020   Procedure: LEFT HEART CATH AND CORONARY ANGIOGRAPHY;  Surgeon: Burnell Blanks, MD;  Location: [place] [facility];  Service: Cardiovascular;  Laterality: N/A;   LUMBAR LAMINECTOMY/DECOMPRESSION MICRODISCECTOMY N/A 05/12/2018   Procedure: Microlumbar decompression L2-3, possible L1-2;  Surgeon: Susa Day, MD;  Location: [place];  Service: Orthopedics;  Laterality: N/A;  2 hrs, Microlumbar decompression L2-3, possible L1-2   PACEMAKER IMPLANT N/A 06/20/2018   Procedure: PACEMAKER IMPLANT;  Surgeon: Evans Lance, MD;  Location: [place] [facility];  Service: Cardiovascular;  Laterality: N/A;   PERCUTANEOUS CORONARY ROTOBLATOR INTERVENTION (PCI-R) N/A 06/30/2012   Procedure: PERCUTANEOUS CORONARY ROTOBLATOR INTERVENTION (PCI-R);  Surgeon: Burnell Blanks, MD;  Location[facility];  Service: Cardiovascular;  Laterality: N/A;   TEE WITHOUT CARDIOVERSION N/A 07/19/2020   Procedure: TRANSESOPHAGEAL ECHOCARDIOGRAM (TEE);  Surgeon: Gaye Pollack, MD;  Location: [place];  Service: Open Heart Surgery;  Laterality:  N/A;   VIDEO BRONCHOSCOPY  08/13/2011   Procedure: VIDEO BRONCHOSCOPY WITHOUT FLUORO;  Surgeon: Tanda Rockers, MD;  Location: [facility];  Service: Cardiopulmonary;  Laterality: Bilateral;    Family History  Problem Relation Age of Onset   Diabetes Mother        and brother    Kidney failure Mother    Heart disease Father        hx of MI   Breast cancer Sister    Colon cancer Neg Hx    Stomach cancer Neg Hx    Esophageal cancer Neg Hx    Rectal cancer Neg Hx     Medications- reviewed and updated Current Outpatient Medications  Medication Sig Dispense Refill   acetaminophen (TYLENOL) 500 MG tablet Take 1,000 mg by mouth every 6 (six) hours as needed (pain).      albuterol (VENTOLIN HFA) 108 (90 Base) MCG/ACT inhaler INHALE 2 PUFFS INTO THE LUNGS EVERY 6 HOURS AS NEEDED FOR WHEEZE OR SHORTNESS OF BREATH 8.5  each 3   aspirin EC 325 MG EC tablet Take 1 tablet (325 mg total) by mouth daily. 30 tablet 0   Choline Fenofibrate (FENOFIBRIC ACID) 135 MG CPDR TAKE 1 CAPSULE BY MOUTH EVERY DAY 90 capsule 3   famotidine (PEPCID) 20 MG tablet Take 20 mg by mouth daily as needed for heartburn.     metFORMIN (GLUCOPHAGE-XR) 500 MG 24 hr tablet Take 2 tablets (1,000 mg total) by mouth daily with supper. (Patient taking differently: Take 1,000 mg by mouth daily with supper. Pt states he "takes it until it makes me feel bad and then I stop for a few days") 180 tablet 3   niacin 500 MG tablet Take 500 mg by mouth at bedtime.     nitroGLYCERIN (NITROSTAT) 0.4 MG SL tablet Place 1 tablet (0.4 mg total) under the tongue every 5 (five) minutes as needed for chest pain. 25 tablet 6   omeprazole (PRILOSEC) 40 MG capsule Take 1 capsule (40 mg total) by mouth daily. 90 capsule 3   rosuvastatin (CRESTOR) 20 MG tablet TAKE 1 TABLET BY MOUTH EVERY DAY 90 tablet 3   hydrochlorothiazide (HYDRODIURIL) 25 MG tablet Take 0.5 tablets (12.5 mg total) by mouth daily as needed. 90 tablet 3   ipratropium (ATROVENT) 0.06 % nasal spray Place 2 sprays into both nostrils 4 (four) times daily. @ sprays at night daiyl as needed. 15 mL 11   metoprolol tartrate (LOPRESSOR) 25 MG tablet Take 0.5 tablets (12.5 mg total) by mouth 2 (two) times daily. 90 tablet 3   No current facility-administered medications for this visit.    Allergies-reviewed and updated Allergies  Allergen Reactions   Amlodipine Besylate Other (See Comments)    Headache, weakness, dizziness   Propofol Other (See Comments)    Heart rate dropped     Lisinopril Cough and Rash   Metoprolol     Lowered heart rate too much    Rosuvastatin Other (See Comments)     Corncern for muscle aches but he is tolerating now     Social History   Socioeconomic History   Marital status: Married    Spouse name: Not on file   Number of children: 1   Years of education: Not on file    Highest education level: Not on file  Occupational History   Occupation: Radio broadcast assistant  Tobacco Use   Smoking status: Former    Packs/day: 0.50    Years: 40.00  Pack years: 20.00    Types: Cigarettes    Quit date: 08/25/2011    Years since quitting: 9.8   Smokeless tobacco: Never  Vaping Use   Vaping Use: Never used  Substance and Sexual Activity   Alcohol use: Not Currently   Drug use: No   Sexual activity: Not Currently  Other Topics Concern   Not on file  Social History Narrative   Not on file   Social Determinants of Health   Financial Resource Strain: Not on file  Food Insecurity: Not on file  Transportation Needs: Not on file  Physical Activity: Not on file  Stress: Not on file  Social Connections: Not on file        Objective:  Physical Exam: BP 125/82 (BP Location: Left Arm, Patient Position: Sitting, Cuff Size: Large)    Pulse 64    Temp 97.6 F (36.4 C) (Temporal)    Ht '5\' 11"'$  (1.803 m)    Wt 189 lb 4 oz (85.8 kg)    SpO2 97%    BMI 26.40 kg/m   Body mass index is 26.4 kg/m. Wt Readings from Last 3 Encounters:  06/25/21 189 lb 4 oz (85.8 kg)  03/07/21 189 lb 6.4 oz (85.9 kg)  02/28/21 185 lb (83.9 kg)   Gen: NAD, resting comfortably HEENT: TMs normal bilaterally. OP clear. No thyromegaly noted.  CV: RRR with no murmurs appreciated Pulm: NWOB, CTAB with no crackles, wheezes, or rhonchi GI: Normal bowel sounds present. Soft, Nontender, Nondistended. MSK: no edema, cyanosis, or clubbing noted Skin: warm, dry.  Bilateral buttocks with erythematous rash.  Neuro: CN2-12 grossly intact. Strength 5/5 in upper and lower extremities. Reflexes symmetric and intact bilaterally.  Psych: Normal affect and thought content     Tommie Dejoseph M. Jerline Pain, MD 06/25/2021 10:10 AM

## 2021-06-25 NOTE — Assessment & Plan Note (Signed)
Check labs.  Continue Crestor 10 mg daily. 

## 2021-06-25 NOTE — Assessment & Plan Note (Signed)
At goal today on metoprolol tartrate 12.5 mg twice daily.  Check labs. ?

## 2021-06-25 NOTE — Assessment & Plan Note (Signed)
Also cardiology.  Check lipids.  On Crestor 20 mg daily and aspirin 325 mg daily. ?

## 2021-06-26 LAB — HSV 2 ANTIBODY, IGG: HSV 2 Glycoprotein G Ab, IgG: 11.3 index — ABNORMAL HIGH

## 2021-06-26 LAB — HSV 1 ANTIBODY, IGG: HSV 1 Glycoprotein G Ab, IgG: <0.9 index

## 2021-06-27 ENCOUNTER — Encounter: Payer: Self-pay | Admitting: Family Medicine

## 2021-06-27 ENCOUNTER — Other Ambulatory Visit: Payer: Self-pay

## 2021-06-27 DIAGNOSIS — B009 Herpesviral infection, unspecified: Secondary | ICD-10-CM | POA: Insufficient documentation

## 2021-06-27 MED ORDER — VALACYCLOVIR HCL 1 G PO TABS: 1000.0000 mg | ORAL_TABLET | Freq: Two times a day (BID) | ORAL | 0 refills | Status: AC

## 2021-06-27 NOTE — Progress Notes (Signed)
Please inform patient of the following: ? 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 This is the likely cause of his rash and other symptoms. Recommend starting valtrex '1000mg'$  daily for 5 days. He can do this as needed for any future flare ups.  ? ?His blood sugar is up a bit but stable. Everything else is stable. Do not need to make any changes to his treatment plan at this time. ? ?I would like to see him back in 3-6 months to recheck A1c. ? ?Daniel Carey. Jerline Pain, MD ?06/27/2021 9:39 AM  ?

## 2021-07-03 ENCOUNTER — Ambulatory Visit (INDEPENDENT_AMBULATORY_CARE_PROVIDER_SITE_OTHER): Payer: Medicare Other

## 2021-07-03 DIAGNOSIS — I441 Atrioventricular block, second degree: Secondary | ICD-10-CM

## 2021-07-04 ENCOUNTER — Other Ambulatory Visit: Payer: Self-pay | Admitting: Family Medicine

## 2021-07-04 LAB — CUP PACEART REMOTE DEVICE CHECK
Battery Remaining Longevity: 74 mo
Battery Remaining Percentage: 70 %
Battery Voltage: 3.01 V
Brady Statistic AP VP Percent: 6.4 %
Brady Statistic AP VS Percent: 1 %
Brady Statistic AS VP Percent: 93 %
Brady Statistic AS VS Percent: 1 %
Brady Statistic RA Percent Paced: 5.8 %
Brady Statistic RV Percent Paced: 99 %
Date Time Interrogation Session: 20230316233355
Implantable Lead Implant Date: 20200302
Implantable Lead Implant Date: 20200302
Implantable Lead Location: 753859
Implantable Lead Location: 753860
Implantable Lead Model: 3830
Implantable Pulse Generator Implant Date: 20200302
Lead Channel Impedance Value: 510 Ohm
Lead Channel Impedance Value: 540 Ohm
Lead Channel Pacing Threshold Amplitude: 0.75 V
Lead Channel Pacing Threshold Amplitude: 0.75 V
Lead Channel Pacing Threshold Pulse Width: 0.5 ms
Lead Channel Pacing Threshold Pulse Width: 0.5 ms
Lead Channel Sensing Intrinsic Amplitude: 12 mV
Lead Channel Sensing Intrinsic Amplitude: 5 mV
Lead Channel Setting Pacing Amplitude: 2 V
Lead Channel Setting Pacing Amplitude: 2.5 V
Lead Channel Setting Pacing Pulse Width: 0.5 ms
Lead Channel Setting Sensing Sensitivity: 4 mV
Pulse Gen Model: 2272
Pulse Gen Serial Number: 9114136

## 2021-07-11 NOTE — Progress Notes (Signed)
Remote pacemaker transmission.   

## 2021-07-14 ENCOUNTER — Encounter: Payer: Self-pay | Admitting: Internal Medicine

## 2021-07-14 ENCOUNTER — Ambulatory Visit: Payer: Medicare Other | Admitting: Internal Medicine

## 2021-07-14 ENCOUNTER — Other Ambulatory Visit: Payer: Self-pay

## 2021-07-14 VITALS — BP 144/88 | HR 71 | Ht 71.0 in | Wt 191.2 lb

## 2021-07-14 DIAGNOSIS — E039 Hypothyroidism, unspecified: Secondary | ICD-10-CM

## 2021-07-14 DIAGNOSIS — E663 Overweight: Secondary | ICD-10-CM

## 2021-07-14 DIAGNOSIS — E1165 Type 2 diabetes mellitus with hyperglycemia: Secondary | ICD-10-CM

## 2021-07-14 LAB — MICROALBUMIN / CREATININE URINE RATIO
Creatinine,U: 113.3 mg/dL
Microalb Creat Ratio: 3.2 mg/g (ref 0.0–30.0)
Microalb, Ur: 3.6 mg/dL — ABNORMAL HIGH (ref 0.0–1.9)

## 2021-07-14 MED ORDER — METFORMIN HCL ER 500 MG PO TB24: 500.0000 mg | ORAL_TABLET | Freq: Three times a day (TID) | ORAL | 3 refills | Status: DC

## 2021-07-14 NOTE — Progress Notes (Addendum)
Patient ID: Daniel Carey, male   DOB: [birth year], 71 y.o.   MRN: [id number] ? ?This visit occurred during the SARS-CoV-2 public health emergency.  Safety protocols were in place, including screening questions prior to the visit, additional usage of staff PPE, and extensive cleaning of exam room while observing appropriate contact time as indicated for disinfecting solutions.  ? ?HPI  ?Daniel Carey is a 71 y.o.-year-old male, returning for f/u for hypothyroidism, dx 2012 and DM2, diet-controlled, non-insulin-dependent, with complications (CAD - s/p CABG 07/2020).  Last visit 4 months ago. ? ?Interim history: ?No increased urination, blurry vision, but has some chest discomfort after he changed his PPI. ?He had nausea with 1000 mg Metformin ER at dinnertime.  No nausea with the lower doses. ? ?Pt. has been dx with hypothyroidism in 2012 >> he was on levothyroxine up to 200 mcg in 2016 >> but did not feel well so he stopped. ? ?Afterwards, his TFTs fluctuated but in the last 3 years they have been in the normal range. ? ?Reviewed previous TFTs: ?Lab Results  ?Component Value Date  ? TSH 3.73 06/25/2021  ? TSH 5.070 (H) 03/07/2021  ? TSH 3.96 03/20/2020  ? TSH 3.83 08/22/2019  ? TSH 3.99 01/31/2018  ? TSH 4.50 08/02/2017  ? TSH 4.89 (H) 12/08/2016  ? TSH 4.13 03/06/2016  ? TSH 9.84 (H) 09/05/2015  ? TSH 0.23 (L) 05/07/2015  ? FREET4 1.04 03/07/2021  ? FREET4 0.75 03/20/2020  ? FREET4 0.80 01/31/2018  ? FREET4 0.65 08/02/2017  ? FREET4 0.83 12/08/2016  ? FREET4 0.67 03/06/2016  ? FREET4 0.66 09/05/2015  ? FREET4 1.25 05/07/2015  ? FREET4 0.79 08/02/2014  ?  ?Lab Results  ?Component Value Date  ? T3FREE 2.7 03/07/2021  ? T3FREE 3.0 03/20/2020  ? T3FREE 2.8 01/31/2018  ? T3FREE 2.6 08/02/2017  ? T3FREE 3.3 12/08/2016  ? T3FREE 3.1 03/06/2016  ? T3FREE 3.2 09/05/2015  ? T3FREE 3.9 05/07/2015  ? T3FREE 2.9 08/02/2014  ? T3FREE 2.8 07/20/2011  ? ?Pt denies: ?- feeling nodules in neck ?- hoarseness ?- dysphagia ?-  choking ?- SOB with lying down ?  ?DM2: ? ?Reviewed HbA1c levels: ?Lab Results  ?Component Value Date  ? HGBA1C 7.0 (H) 06/25/2021  ? HGBA1C 6.6 (A) 03/07/2021  ? HGBA1C 6.2 (A) 11/01/2020  ?07/03/2016: HbA1c 6.8% ? ?He was on: ?- Metformin 500 mg 2x a day >> stopped 2/2 nausea  ?- Metformin ER 1000 mg with dinner >> nausea ? ?Currently on:  ?- Metformin ER 500 mg twice a day with meals >> he recently increased it to 3x a day ? ?He continues to limit sweets and to not eat fried foods.  However, he is eating cereal without milk as a snack in the evening. ? ?He checks his sugars 3x a day: ?- am: 140, 159, 179 >> 120-140 >> 138-145, 147 >> 100-165 ?- 2h after b'fast: n/c ?- lunch: 136 >> 135, 140-160 >> 80-150 >> 120s >> 80, 96-117 ?- 2h after lunch: 100 >> n/c ?- dinner: 120, 121 >> 150-174 >> 90-140 >> 120s >> 90s-120 ?- 2h after dinner: n/c ?- bedtime: n/c ?Feels sugars <80. Lowest: 92 >> 80s. Highest: 147 >> 160s. ? ?Meter: ReliOn >> ReliOn Premier Blu ? ?+ CKD. Last BUN/Cr: ?Lab Results  ?Component Value Date  ? BUN 22 06/25/2021  ? BUN 18 11/11/2020  ? BUN 22 07/23/2020  ? CREATININE 1.20 06/25/2021  ? CREATININE 1.23 11/11/2020  ? CREATININE 1.23 07/23/2020  ?  He is off ACE inhibitor/ARB due to history of high potassium. ? ?Latest potassium levels have been normal: ?Lab Results  ?Component Value Date  ? K 4.6 06/25/2021  ? K 4.8 11/11/2020  ? K 4.3 07/23/2020  ? K 4.0 07/22/2020  ? K 4.3 07/21/2020  ? ?+ HL: ?Lab Results  ?Component Value Date  ? CHOL 148 06/25/2021  ? HDL 49.80 06/25/2021  ? [place] 70 06/25/2021  ? LDLDIRECT 107.0 02/27/2013  ? TRIG 141.0 06/25/2021  ? CHOLHDL 3 06/25/2021  ?On Crestor 20, Niacin, and fenofibrate. ? ?Last eye exam in  2022: reportedly; No DR, no glaucoma, + incipient cataracts. ? ?He sees Dr Acie Fredrickson >> had angioplasy x3 in the past, and 2 stents placed. He he has a history of an AMI 73 (mild). He has a Mobitz 1 AVB. Had a catheterization in 10/2016 >> partial blockages. ?He  has OSA and wears a CPAP ?He had a colonoscopy 02/2017 >> E coli sepsis. ?He stays active playing golf, but not since his CABG. ? ?ROS: ?+ See HPI ? ?Past Medical History:  ?Diagnosis Date  ? Arthritis   ? left wrist  ? AV block, Mobitz 1   ? noted on EKG March 2013  ? CAD (coronary artery disease)   ? s/p atherectomy, PTCA/DES x2 mid & distal RCA 06/30/12  ? Carotid artery occlusion   ? Chest pain   ? Complication of anesthesia   ? heart rate dropping with propofol last procedure  ? COPD (chronic obstructive pulmonary disease) ([place])   ? Dyspnea   ? ED (erectile dysfunction)   ? Esophageal stricture   ? External hemorrhoids without mention of complication   ? GERD (gastroesophageal reflux disease)   ? Headache(784.0)   ? Hiatal hernia   ? History of kidney stones   ? HLD (hyperlipidemia)   ? HTN (hypertension)   ? Myocardial infarction [facility])   ? 1996  ? Personal history of colonic polyps 03/12/2010  ? TUBULAR ADENOMA  ? Presence of permanent cardiac pacemaker   ? Sleep apnea   ? wears CPAP  ? Tobacco abuse   ? Tubular adenoma of colon   ? Type 2 diabetes mellitus ([place])   ? ?Past Surgical History:  ?Procedure Laterality Date  ? ANGIOPLASTY    ? X3  ? BACK SURGERY    ? CARDIAC CATHETERIZATION    ? 8 years ago stents  ? COLONOSCOPY N/A 02/16/2017  ? Procedure: COLONOSCOPY;  Surgeon: Jerene Bears, MD;  Location: [facility];  Service: Gastroenterology;  Laterality: N/A;  ? COLONOSCOPY    ? CORONARY ARTERY BYPASS GRAFT N/A 07/19/2020  ? Procedure: CORONARY ARTERY BYPASS GRAFTING (CABG) TIMES THREE USING LEFT INTERNAL MAMMARY ARTERY AND RIGHT GREATER SAPHENOUS VEIN HARVESTED ENDOSCOPICALLY;  Surgeon: Gaye Pollack, MD;  Location: [place];  Service: Open Heart Surgery;  Laterality: N/A;  ? INSERT / REPLACE / REMOVE PACEMAKER  06/20/2018  ? LEFT HEART CATH AND CORONARY ANGIOGRAPHY N/A 11/05/2016  ? Procedure: Left Heart Cath and Coronary Angiography;  Surgeon: Nelva Bush, MD;  Location: [place] CV LAB;  Service:  Cardiovascular;  Laterality: N/A;  ? LEFT HEART CATH AND CORONARY ANGIOGRAPHY N/A 07/12/2020  ? Procedure: LEFT HEART CATH AND CORONARY ANGIOGRAPHY;  Surgeon: Burnell Blanks, MD;  Location: [place] [facility];  Service: Cardiovascular;  Laterality: N/A;  ? LUMBAR LAMINECTOMY/DECOMPRESSION MICRODISCECTOMY N/A 05/12/2018  ? Procedure: Microlumbar decompression L2-3, possible L1-2;  Surgeon: Susa Day, MD;  Location[name]  OR;  Service: Orthopedics;  Laterality: N/A;  2 hrs, Microlumbar decompression L2-3, possible L1-2  ? PACEMAKER IMPLANT N/A 06/20/2018  ? Procedure: PACEMAKER IMPLANT;  Surgeon: Evans Lance, MD;  Location: [place] [facility] LAB;  Service: Cardiovascular;  Laterality: N/A;  ? PERCUTANEOUS CORONARY ROTOBLATOR INTERVENTION (PCI-R) N/A 06/30/2012  ? Procedure: PERCUTANEOUS CORONARY ROTOBLATOR INTERVENTION (PCI-R);  Surgeon: Burnell Blanks, MD;  Location: [facility] C[facility] LAB;  Service: Cardiovascular;  Laterality: N/A;  ? TEE WITHOUT CARDIOVERSION N/A 07/19/2020  ? Procedure: TRANSESOPHAGEAL ECHOCARDIOGRAM (TEE);  Surgeon: Gaye Pollack, MD;  Location: [place];  Service: Open Heart Surgery;  Laterality: N/A;  ? VIDEO BRONCHOSCOPY  08/13/2011  ? Procedure: VIDEO BRONCHOSCOPY WITHOUT FLUORO;  Surgeon: Tanda Rockers, MD;  Location: [facility];  Service: Cardiopulmonary;  Laterality: Bilateral;  ? ?History  ? ?Social History  ? Marital Status: Married  ?  Spouse Name: N/A  ?  Number of Children: 1  ? ?Occupational History  ? [employer]   ? ?Social History Main Topics  ? Smoking status: Former Smoker -- 0.50 packs/day for 40 years  ?  Types: Cigarettes  ?  Quit date: 08/25/2011  ? Smokeless tobacco: Never Used  ? Alcohol Use: 0.0 oz/week  ?   Comment: rare  ? Drug Use: No  ? ?Current Outpatient Medications on File Prior to Visit  ?Medication Sig Dispense Refill  ? acetaminophen (TYLENOL) 500 MG tablet Take 1,000 mg by mouth every 6 (six) hours as needed (pain).     ? albuterol  (VENTOLIN HFA) 108 (90 Base) MCG/ACT inhaler INHALE 2 PUFFS INTO THE LUNGS EVERY 6 HOURS AS NEEDED FOR WHEEZE OR SHORTNESS OF BREATH 8.5 each 3  ? aspirin EC 325 MG EC tablet Take 1 tablet (325 mg total) by mouth dail

## 2021-07-14 NOTE — Patient Instructions (Addendum)
Please continue: ?- Metformin ER 500 mg 3x a day ? ?Try to stop snacking or replace the snacks with healthier alternatives. ? ?Please return in 4 months with your sugar log.  ?

## 2021-08-11 ENCOUNTER — Telehealth: Payer: Self-pay

## 2021-08-11 NOTE — Telephone Encounter (Signed)
Pt's wife lvm to advise [employer] will be sending over paperwork to be completed for a blood sugar meter. ?

## 2021-08-14 ENCOUNTER — Other Ambulatory Visit: Payer: Self-pay | Admitting: Family Medicine

## 2021-08-14 ENCOUNTER — Other Ambulatory Visit: Payer: Self-pay | Admitting: Cardiovascular Disease

## 2021-08-26 ENCOUNTER — Other Ambulatory Visit: Payer: Self-pay

## 2021-08-26 DIAGNOSIS — I6523 Occlusion and stenosis of bilateral carotid arteries: Secondary | ICD-10-CM

## 2021-09-04 ENCOUNTER — Other Ambulatory Visit: Payer: Self-pay | Admitting: Family Medicine

## 2021-09-10 ENCOUNTER — Ambulatory Visit: Payer: Medicare Other | Admitting: Vascular Surgery

## 2021-09-10 ENCOUNTER — Encounter: Payer: Self-pay | Admitting: Vascular Surgery

## 2021-09-10 ENCOUNTER — Ambulatory Visit (HOSPITAL_COMMUNITY)
Admission: RE | Admit: 2021-09-10 | Discharge: 2021-09-10 | Disposition: A | Discharge status: Home / Self Care | Payer: Medicare Other | Source: Ambulatory Visit | Attending: Vascular Surgery | Admitting: Vascular Surgery

## 2021-09-10 VITALS — BP 142/80 | HR 62 | Temp 97.9°F | Resp 20 | Ht 71.0 in | Wt 187.0 lb

## 2021-09-10 DIAGNOSIS — I6523 Occlusion and stenosis of bilateral carotid arteries: Secondary | ICD-10-CM

## 2021-09-10 NOTE — Progress Notes (Signed)
Patient ID: Daniel Carey, male   DOB: Aug 13, 1950, 71 y.o.   MRN: [id number]  Reason for Consult: Follow-up   Referred by Vivi Barrack, MD  Subjective:     HPI:  Daniel Carey is a 71 y.o. male history of coronary artery bypass found to have bilateral carotid artery stenosis.  No history of stroke, TIA or amaurosis.  No personal family history of aneurysm disease.  He is a former smoker.  He also has hyperlipidemia hypertension and borderline diabetes.  He does take aspirin and Crestor.  He is not on any blood thinners.  No changes in medical history since last visit.  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    noted on EKG March 2013   CAD (coronary artery disease)    s/p atherectomy, PTCA/DES x2 mid & distal RCA 06/30/12   Carotid artery occlusion    Chest pain    Complication of anesthesia    heart rate dropping with propofol last procedure   COPD (chronic obstructive pulmonary disease) ([place])    Dyspnea    ED (erectile dysfunction)    Esophageal stricture    External hemorrhoids without mention of complication    GERD (gastroesophageal reflux disease)    Headache(784.0)    Hiatal hernia    History of kidney stones    HLD (hyperlipidemia)    HTN (hypertension)    Myocardial infarction ([place])    1996   Personal history of colonic polyps 03/12/2010   TUBULAR ADENOMA   Presence of permanent cardiac pacemaker    Sleep apnea    wears CPAP   Tobacco abuse    Tubular adenoma of colon    Type 2 diabetes mellitus ([place])    Family History  Problem Relation Age of Onset   Diabetes Mother        and brother    Kidney failure Mother    Heart disease Father        hx of MI   Breast cancer Sister    Colon cancer Neg Hx    Stomach cancer Neg Hx    Esophageal cancer Neg Hx    Rectal cancer Neg Hx    Past Surgical History:  Procedure Laterality Date   ANGIOPLASTY     X3   BACK SURGERY     CARDIAC CATHETERIZATION     8  years ago stents   COLONOSCOPY N/A 02/16/2017   Procedure: COLONOSCOPY;  Surgeon: Jerene Bears, MD;  Location: [facility];  Service: Gastroenterology;  Laterality: N/A;   COLONOSCOPY     CORONARY ARTERY BYPASS GRAFT N/A 07/19/2020   Procedure: CORONARY ARTERY BYPASS GRAFTING (CABG) TIMES THREE USING LEFT INTERNAL MAMMARY ARTERY AND RIGHT GREATER SAPHENOUS VEIN HARVESTED ENDOSCOPICALLY;  Surgeon: Gaye Pollack, MD;  Location: [facility];  Service: Open Heart Surgery;  Laterality: N/A;   INSERT / REPLACE / REMOVE PACEMAKER  06/20/2018   LEFT HEART CATH AND CORONARY ANGIOGRAPHY N/A 11/05/2016   Procedure: Left Heart Cath and Coronary Angiography;  Surgeon: Nelva Bush, MD;  Location: [facility];  Service: Cardiovascular;  Laterality: N/A;   LEFT HEART CATH AND CORONARY ANGIOGRAPHY N/A 07/12/2020   Procedure: LEFT HEART CATH AND CORONARY ANGIOGRAPHY;  Surgeon: Burnell Blanks, MD;  Location: [facility];  Service: Cardiovascular;  Laterality: N/A;   LUMBAR LAMINECTOMY/DECOMPRESSION MICRODISCECTOMY N/A 05/12/2018   Procedure: Microlumbar decompression L2-3, possible  L1-2;  Surgeon: Susa Day, MD;  Location: [facility];  Service: Orthopedics;  Laterality: N/A;  2 hrs, Microlumbar decompression L2-3, possible L1-2   PACEMAKER IMPLANT N/A 06/20/2018   Procedure: PACEMAKER IMPLANT;  Surgeon: Evans Lance, MD;  Location: [facility];  Service: Cardiovascular;  Laterality: N/A;   PERCUTANEOUS CORONARY ROTOBLATOR INTERVENTION (PCI-R) N/A 06/30/2012   Procedure: PERCUTANEOUS CORONARY ROTOBLATOR INTERVENTION (PCI-R);  Surgeon: Burnell Blanks, MD;  Location: [facility];  Service: Cardiovascular;  Laterality: N/A;   TEE WITHOUT CARDIOVERSION N/A 07/19/2020   Procedure: TRANSESOPHAGEAL ECHOCARDIOGRAM (TEE);  Surgeon: Gaye Pollack, MD;  Location: [facility];  Service: Open Heart Surgery;  Laterality: N/A;   VIDEO BRONCHOSCOPY  08/13/2011   Procedure: VIDEO BRONCHOSCOPY  WITHOUT FLUORO;  Surgeon: Tanda Rockers, MD;  Location: [facility];  Service: Cardiopulmonary;  Laterality: Bilateral;    Short Social History:  Social History   Tobacco Use   Smoking status: Former    Packs/day: 0.50    Years: 40.00    Pack years: 20.00    Types: Cigarettes    Quit date: 08/25/2011    Years since quitting: 10.0   Smokeless tobacco: Never  Substance Use Topics   Alcohol use: Not Currently    Allergies  Allergen Reactions   Amlodipine Besylate Other (See Comments)    Headache, weakness, dizziness   Propofol Other (See Comments)    Heart rate dropped     Lisinopril Cough and Rash   Metoprolol     Lowered heart rate too much    Rosuvastatin Other (See Comments)     Corncern for muscle aches but he is tolerating now     Current Outpatient Medications  Medication Sig Dispense Refill   acetaminophen (TYLENOL) 500 MG tablet Take 1,000 mg by mouth every 6 (six) hours as needed (pain).      albuterol (VENTOLIN HFA) 108 (90 Base) MCG/ACT inhaler INHALE 2 PUFFS INTO THE LUNGS EVERY 6 HOURS AS NEEDED FOR WHEEZE OR SHORTNESS OF BREATH 8.5 each 3   aspirin EC 325 MG EC tablet Take 1 tablet (325 mg total) by mouth daily. 30 tablet 0   Choline Fenofibrate (FENOFIBRIC ACID) 135 MG CPDR TAKE 1 CAPSULE BY MOUTH EVERY DAY 90 capsule 3   famotidine (PEPCID) 20 MG tablet Take 20 mg by mouth daily as needed for heartburn.     ipratropium (ATROVENT) 0.06 % nasal spray Place 2 sprays into both nostrils 4 (four) times daily. @ sprays at night daiyl as needed. 15 mL 11   metFORMIN (GLUCOPHAGE-XR) 500 MG 24 hr tablet Take 1 tablet (500 mg total) by mouth 3 (three) times daily after meals. 270 tablet 3   niacin 500 MG tablet Take 500 mg by mouth at bedtime.     nitroGLYCERIN (NITROSTAT) 0.4 MG SL tablet Place 1 tablet (0.4 mg total) under the tongue every 5 (five) minutes as needed for chest pain. 25 tablet 6   omeprazole (PRILOSEC) 40 MG capsule Take 1 capsule (40 mg total) by  mouth daily. 90 capsule 3   rosuvastatin (CRESTOR) 20 MG tablet Take 1 tablet (20 mg total) by mouth daily. 90 tablet 0   hydrochlorothiazide (HYDRODIURIL) 25 MG tablet Take 0.5 tablets (12.5 mg total) by mouth daily as needed. 90 tablet 3   metoprolol tartrate (LOPRESSOR) 25 MG tablet Take 0.5 tablets (12.5 mg total) by mouth 2 (two) times daily. 90 tablet 3   No current facility-administered medications for this visit.  Review of Systems  Constitutional:  Constitutional negative. HENT: HENT negative.  Eyes: Eyes negative.  Respiratory: Respiratory negative.  Cardiovascular: Cardiovascular negative.  GI: Gastrointestinal negative.  Musculoskeletal: Musculoskeletal negative.  Skin: Skin negative.  Neurological: Neurological negative. Hematologic: Hematologic/lymphatic negative.  Psychiatric: Psychiatric negative.       Objective:  Objective   Vitals:   09/10/21 1122  BP: (!) 142/80  Pulse: 62  Resp: 20  Temp: 97.9 F (36.6 C)  SpO2: 95%  Weight: 187 lb (84.8 kg)  Height: '5\' 11"'$  (1.803 m)   Body mass index is 26.08 kg/m.  Physical Exam HENT:     Head: Normocephalic.     Nose: Nose normal.  Eyes:     Pupils: Pupils are equal, round, and reactive to light.  Neck:     Vascular: No carotid bruit.  Cardiovascular:     Rate and Rhythm: Normal rate.  Pulmonary:     Breath sounds: Normal breath sounds.  Abdominal:     General: Abdomen is flat.     Palpations: Abdomen is soft.  Musculoskeletal:        General: Normal range of motion.     Right lower leg: No edema.     Left lower leg: No edema.  Skin:    General: Skin is warm and dry.     Capillary Refill: Capillary refill takes less than 2 seconds.  Neurological:     General: No focal deficit present.     Mental Status: He is alert.  Psychiatric:        Mood and Affect: Mood normal.        Behavior: Behavior normal.        Thought Content: Thought content normal.        Judgment: Judgment normal.     Data: Right Carotid Findings:  +----------+--------+--------+--------+-------------------------+--------+            PSV cm/sEDV cm/sStenosisPlaque Description       Comments  +----------+--------+--------+--------+-------------------------+--------+  CCA Prox  106     24                                                 +----------+--------+--------+--------+-------------------------+--------+  CCA Mid   100     26              heterogenous                       +----------+--------+--------+--------+-------------------------+--------+  CCA Distal87      22              heterogenous                       +----------+--------+--------+--------+-------------------------+--------+  ICA Prox  201     71      60-79%  heterogenous and calcific          +----------+--------+--------+--------+-------------------------+--------+  ICA Mid   103     36                                                 +----------+--------+--------+--------+-------------------------+--------+  ICA Distal75      30                                                 +----------+--------+--------+--------+-------------------------+--------+  ECA       96      8                                                  +----------+--------+--------+--------+-------------------------+--------+   +----------+--------+-------+----------------+-------------------+            PSV cm/sEDV cmsDescribe        Arm Pressure (mmHG)  +----------+--------+-------+----------------+-------------------+  Subclavian102            Multiphasic, [id number]                  +----------+--------+-------+----------------+-------------------+   +---------+--------+--+--------+--+---------+  VertebralPSV cm/s47EDV cm/s12Antegrade  +---------+--------+--+--------+--+---------+       Left Carotid Findings:  +----------+--------+--------+--------+-------------------------+--------+             PSV cm/sEDV cm/sStenosisPlaque Description       Comments  +----------+--------+--------+--------+-------------------------+--------+  CCA Prox  109     29              heterogenous                       +----------+--------+--------+--------+-------------------------+--------+  CCA Mid   69      22              calcific                           +----------+--------+--------+--------+-------------------------+--------+  CCA Distal281     89      >50%    calcific and irregular             +----------+--------+--------+--------+-------------------------+--------+  ICA Prox  224     72      60-79%  heterogenous and calcific          +----------+--------+--------+--------+-------------------------+--------+  ICA Mid   85      32                                                 +----------+--------+--------+--------+-------------------------+--------+  ICA Distal84      35                                                 +----------+--------+--------+--------+-------------------------+--------+  ECA       235     37      >50%    heterogenous and calcific          +----------+--------+--------+--------+-------------------------+--------+   +----------+--------+--------+----------------+-------------------+            PSV cm/sEDV cm/sDescribe        Arm Pressure (mmHG)  +----------+--------+--------+----------------+-------------------+  [id number]             Multiphasic, [id number]                  +----------+--------+--------+----------------+-------------------+   +---------+--------+--+--------+--+---------+  VertebralPSV cm/s49EDV cm/s12Antegrade  +---------+--------+--+--------+--+---------+           Summary:  Right Carotid: Velocities in the right ICA are consistent with a 60-79%  stenosis.   Left Carotid: Velocities in the left ICA are consistent with a 60-79%  stenosis.                 Hemodynamically significant plaque >50% visualized in the  CCA. The                ECA appears >50% stenosed.   Vertebrals:  Bilateral vertebral arteries demonstrate antegrade flow.  Subclavians: Normal flow hemodynamics were seen in bilateral subclavian               arteries.      Assessment/Plan:    71 year old male with bilateral moderate grade carotid stenosis that is asymptomatic.  He will continue aspirin and Crestor.  He will follow-up in 1 year with repeat carotid duplex.     Waynetta Sandy MD [facility]

## 2021-10-02 ENCOUNTER — Other Ambulatory Visit: Payer: Self-pay | Admitting: Internal Medicine

## 2021-10-02 ENCOUNTER — Ambulatory Visit (INDEPENDENT_AMBULATORY_CARE_PROVIDER_SITE_OTHER): Payer: Medicare Other

## 2021-10-02 DIAGNOSIS — I441 Atrioventricular block, second degree: Secondary | ICD-10-CM | POA: Diagnosis not present

## 2021-10-02 LAB — CUP PACEART REMOTE DEVICE CHECK
Battery Remaining Longevity: 72 mo
Battery Remaining Percentage: 67 %
Battery Voltage: 2.99 V
Brady Statistic AP VP Percent: 7.5 %
Brady Statistic AP VS Percent: 1 %
Brady Statistic AS VP Percent: 92 %
Brady Statistic AS VS Percent: 1 %
Brady Statistic RA Percent Paced: 6.8 %
Brady Statistic RV Percent Paced: 99 %
Date Time Interrogation Session: 20230615020017
Implantable Lead Implant Date: 20200302
Implantable Lead Implant Date: 20200302
Implantable Lead Location: 753859
Implantable Lead Location: 753860
Implantable Lead Model: 3830
Implantable Pulse Generator Implant Date: 20200302
Lead Channel Impedance Value: 460 Ohm
Lead Channel Impedance Value: 510 Ohm
Lead Channel Pacing Threshold Amplitude: 0.75 V
Lead Channel Pacing Threshold Amplitude: 0.75 V
Lead Channel Pacing Threshold Pulse Width: 0.5 ms
Lead Channel Pacing Threshold Pulse Width: 0.5 ms
Lead Channel Sensing Intrinsic Amplitude: 12 mV
Lead Channel Sensing Intrinsic Amplitude: 4.7 mV
Lead Channel Setting Pacing Amplitude: 2 V
Lead Channel Setting Pacing Amplitude: 2.5 V
Lead Channel Setting Pacing Pulse Width: 0.5 ms
Lead Channel Setting Sensing Sensitivity: 4 mV
Pulse Gen Model: 2272
Pulse Gen Serial Number: 9114136

## 2021-10-13 NOTE — Progress Notes (Signed)
Remote pacemaker transmission.   

## 2021-11-06 ENCOUNTER — Other Ambulatory Visit: Payer: Self-pay | Admitting: Internal Medicine

## 2021-11-06 ENCOUNTER — Telehealth: Payer: Self-pay | Admitting: Cardiovascular Disease

## 2021-11-06 DIAGNOSIS — E782 Mixed hyperlipidemia: Secondary | ICD-10-CM

## 2021-11-06 DIAGNOSIS — E118 Type 2 diabetes mellitus with unspecified complications: Secondary | ICD-10-CM

## 2021-11-06 NOTE — Telephone Encounter (Signed)
Called Daniel Carey to get him set up 6 month fu with Dr Acie Fredrickson and his wife asked if was to do blood work for the visit either before or on the day of. Told her I did not know but would reach out. Asking specifically about checking cholesterol for example. Please call and advise at [phone]. Patient has appt set up for 11/11/21 @ 4:20pm.

## 2021-11-07 NOTE — Telephone Encounter (Signed)
Nahser, Wonda Cheng, MD  You 6 minutes ago (2:21 PM)    Agree with checking those labs at his next office visit.   PN     Order placed for Lipids and hgbA1C. Will add onto appt notes so that covering RN is aware. MyChart message sent to patient.

## 2021-11-07 NOTE — Addendum Note (Signed)
Addended by: Ma Hillock on: 11/07/2021 02:38 PM   Modules accepted: Orders

## 2021-11-07 NOTE — Telephone Encounter (Signed)
Called and spoke to patient who is requesting that we check his Lipids and A1C at his upcoming visit with Dr Acie Fredrickson on 7/25. Had checked by PCP on 06/25/21, but he states that he "really wants to keep an eye on these." Will send to MD to make aware.

## 2021-11-11 ENCOUNTER — Ambulatory Visit (INDEPENDENT_AMBULATORY_CARE_PROVIDER_SITE_OTHER): Payer: Medicare Other | Admitting: Internal Medicine

## 2021-11-11 ENCOUNTER — Ambulatory Visit: Payer: Medicare Other | Admitting: Cardiovascular Disease

## 2021-11-11 ENCOUNTER — Encounter: Payer: Self-pay | Admitting: Internal Medicine

## 2021-11-11 ENCOUNTER — Encounter: Payer: Self-pay | Admitting: Cardiovascular Disease

## 2021-11-11 ENCOUNTER — Other Ambulatory Visit: Payer: Medicare Other

## 2021-11-11 VITALS — BP 110/70 | HR 66 | Ht 71.0 in | Wt 186.8 lb

## 2021-11-11 VITALS — BP 118/78 | HR 67 | Ht 71.0 in | Wt 186.0 lb

## 2021-11-11 DIAGNOSIS — E782 Mixed hyperlipidemia: Secondary | ICD-10-CM | POA: Diagnosis not present

## 2021-11-11 DIAGNOSIS — E1165 Type 2 diabetes mellitus with hyperglycemia: Secondary | ICD-10-CM

## 2021-11-11 DIAGNOSIS — I2510 Atherosclerotic heart disease of native coronary artery without angina pectoris: Secondary | ICD-10-CM | POA: Diagnosis not present

## 2021-11-11 DIAGNOSIS — E118 Type 2 diabetes mellitus with unspecified complications: Secondary | ICD-10-CM | POA: Diagnosis not present

## 2021-11-11 DIAGNOSIS — E039 Hypothyroidism, unspecified: Secondary | ICD-10-CM | POA: Diagnosis not present

## 2021-11-11 DIAGNOSIS — E663 Overweight: Secondary | ICD-10-CM | POA: Diagnosis not present

## 2021-11-11 NOTE — Patient Instructions (Addendum)
Please continue off Metformin.  Try: Normal Dental   Please return in 4 months with your sugar log.

## 2021-11-11 NOTE — Progress Notes (Signed)
Daniel Carey Date of Birth  July 18, 1950       [place] [address], [place] Daniel Carey, [name]  [address]   [place], [place]  [address] [phone]     [phone]   Fax  [phone]    Fax [phone]  Problem List: 1. CAD - 2.5 x 38 mm Promus Premier DES in the distal RCA and a 2.75 x 38 mm Promus Premier DES in the mid vessel in an overlapping fashion. The distal stent was post-dilated with a 2.5 x 20 mm [place] balloon. The proximal portion of the stent was post-dilated with a 3.0 x 20 mm [place] balloon ( June 30, 2012) current: The care  2. Bradycardia after getting propafol for endoscopy. 3. Obstructive sleep apnea:   :  Daniel Carey is feeling well today.  He's been getting some exercise. He does yard work and walks on the treadmill occasionally. He had an episode of bradycardia after getting IV protocol for an endoscopy procedure. He was seen by Daniel Carey. His metoprolol dose was decreased. He has also had a bronchoscopy with [name]. He does not have any evidence of COPD.  Both he and his wife have stop smoking.  ( Aug 20, 2011)  Feb. 28, 2014:  He continues to have increasing chest p pain Dr. Edwinna Areola Carey.  He has not been smoking.  The pains are associated with exertion, last 5-10 minutes, resolve with 1 SL NTG.   He's had a mildly elevated TSH for the past year or so and has had some fatigue.    his TSH level 6.8.      His medical doctor started him on Synthroid. He gradually has titrated up to 0.1 mg a day. Since that time he's had increasing episodes of chest pain. All so the house that taxes at the same time present all is doing the same time in the summer and not a [facility] Aug 29, 2012: We will show  Daniel Carey saw him in February we performed a cardiac catheterization because of increasing chest pain. He was found to have sequential stenosis in his mid right coronary artery and had PCI of his right coronary artery ( 2.5 x  38 mm Promus Premier DES in the distal RCA and a 2.75 x 38 mm Promus Premier DES in the mid vessel in an overlapping fashion. The distal sten was post-dilated with a 2.5 x 20 mm [place] balloon. The proximal portion of the stent was post-dilated with a 3.0 x 20 mm [place] balloon)  He and his wife are concerned about whether or not the Plavix is active.  His generic plavix manufacturer has changed.   He would like to be tested.   October 14, 2012:  Daniel Carey has been having some occasional CP.  He has had some episodes of dyspnea while climbing up a hill playing golf.  This past Monday night, he had some chest pain - possible due to indigestions.  these  He had lost his NTG - he has refilled it now.    Nov. 10, 2014:  Daniel Carey is doing well.  He is working too much according to wife. he's had a few CP but not severe enough to take NTG.  Still has some dyspnea with climbing steps.   He had stenting in march 2014.  October 23, 2013:  Daniel Carey is doing well.   Playing golf.  Working in the yard.  Not  exercising as much.   He has lost about 6 lbs.  He had labs drawn today.     Jan. 7, 2016:  He has been waking up in the middle of the night and feels like he cant catch his  Breath. Still working out - on the treadmill twice this week and has done well.   Sept. 1, 2016:  Has had some short of breath. Does not get any exercise.  His CP is much better since starting CPAP .  Is having bloating  - thinks its due to the lipitor   July 19, 2015: Has had some shortness of breath.   With exertion . Happens occasionally but seem to be getting more frequently .  BP is elevated today  Has cut back on his salt .   Sept. 21, 2017:  Daniel Carey is seen today for follow up visit for his CAD Occasionally short of breath . Comes and goes .  BP is a little elevated today Has occasional CP  Able to do all that he wants to do   November 04, 2016: Still having some chest pain Not exertional ,  Seems very similar to his previous  episodes of angina prior to stenting .   July 31 , 2108  Daniel Carey is seen today for follow up of a recent cath.  He was found to have diffuse moderate - severe CAD - especially in the LAD  Was started on Imdur - is having a headache with that. Angina seems to be slightly  better.  He still has marked DOE - wife is very concerned about this .   is going to see pulmonary soon .  Sept. 24, 2018:  Daniel Carey is doing very well. His chest pain seemed to be better. Getting some exercise   A little bit more salt than usual over the weekend. His diastolic blood pressure today is 90.  Nov. 8, 2018:    Daniel Carey is seen today  Has worsening dyspnea.   Chest tightness Also had a bladder infection  - burning with urination. ,   Fevers, chills.   Temp of 99.8 Still having fevers and chills  His dyspnea seems to be worse with his chills.  No blood in urine,   extremy dysuria and frequency + cough , no hemoptysis, No pleuretic CP  No real angina   April 08, 2017:  Daniel Carey is seen back for follow up visit Has urosepsis when I last saw him ( UTI with bacteremia )  Treated with ABx Now is having dysuria .   Is seeing urologist tomorrow .   No serious angina .   December 23, 2017:  Daniel Carey is seen today for follow-up of his coronary artery disease. Had a colonoscopy  Complicated by a E.coli UTI when I last saw him  Rare episodes of CP   Sept. 2, 2020   Doing well Had a pacer placed a month ago  Feels beter Has some CP if he starts walks on the treadmill.   Is able to walk through it and usually does ok  Lasts for a minutes.  Last cath was July 2019.  Conclusions: Multivessel coronary artery disease, including diffuse calcified LAD disease of up to 60-70% (FFR 0.73), moderate to severe LCx and OM3 disease (FFR 0.83 at submaximal hyperemia), and moderate in-stent restenosis of mid RCA. Normal left ventricular filling pressure. Normal left ventricular systolic function.    Recommendations: Escalate antianginal therapy with addition of isosorbide mononitrate. Continue aggressive secondary  prevention, including dual antiplatelet and statin therapy. I will review the films with the intervention and cardiac surgery teams. Revascularization options include atherectomy and PCI to LAD (would require multiple stents) versus CABG.  He has found that if he takes an extra metoprolol about an hour before he exercises that his angina is much better controlled.  We will have him continue that dosing scheme.  June 22, 2019:  Occasional CP ,  Has some chest pressure while on the treadmill.   Rests for 5-10 min then the discomfort resolves Has not improved with takeing metoprolol before his workout.  He also notes he is having more shortness of breath.  Has cut his salt intake .  BP is elevated today .   Has not been elevated at home   His last heart catheterization was in 2018.  He was found to have diffuse coronary artery disease.  We increased medical therapy.  Tested + for Covid in January.   May still be recovering from that    Sept. 3, 2021: Seen for follow up of his CAD, HLD,  Pacer  S/p pacer for bradycardia  Still has some occasional episodes of CP when he is going to bed.  Atypical  No CP with activity  No pressure  Just pins and needles sensation    July 05, 2020 Hx of CAD, pacer, HLD Still having some CP on occasion .  Also has panic attacks  Has some numbness in his hands. Can walk on the treadmill.   Does ok as long as he has not eaten anything recently  Cp may be gradually getting worse.  Relieved with SL NTG  Is on metoprolol, imdur Is intol to amlodipine  Has these discomforts several times a week ,  He thinks is progressing   November 11, 2020:  Daniel Carey is seen for follow up of his CAD  He has severe 3 V cad  Had CABG April , 2022 Going to cardiac rehab.   At [facility]  Sternotomy looks good    November 11, 2021 Daniel Carey  is seen today  for follow-up of his coronary artery disease.  He is status postcoronary artery bypass grafting in April 2022. Is exercising regularly ,    Current Outpatient Medications on File Prior to Visit  Medication Sig Dispense Refill   acetaminophen (TYLENOL) 500 MG tablet Take 1,000 mg by mouth every 6 (six) hours as needed (pain).      albuterol (VENTOLIN HFA) 108 (90 Base) MCG/ACT inhaler INHALE 2 PUFFS INTO THE LUNGS EVERY 6 HOURS AS NEEDED FOR WHEEZE OR SHORTNESS OF BREATH 8.5 each 3   aspirin EC 325 MG EC tablet Take 1 tablet (325 mg total) by mouth daily. 30 tablet 0   Choline Fenofibrate (FENOFIBRIC ACID) 135 MG CPDR TAKE 1 CAPSULE BY MOUTH EVERY DAY 90 capsule 3   famotidine (PEPCID) 20 MG tablet Take 20 mg by mouth daily as needed for heartburn.     hydrochlorothiazide (HYDRODIURIL) 25 MG tablet TAKE 0.5 TABLETS (12.5 MG TOTAL) BY MOUTH DAILY AS NEEDED. 30 tablet 0   ipratropium (ATROVENT) 0.06 % nasal spray Place 2 sprays into both nostrils 4 (four) times daily. @ sprays at night daiyl as needed. 15 mL 11   metoprolol tartrate (LOPRESSOR) 25 MG tablet TAKE 1/2 TABLET TWICE A DAY BY MOUTH 45 tablet 1   niacin 500 MG tablet Take 500 mg by mouth at bedtime.     nitroGLYCERIN (NITROSTAT) 0.4 MG SL tablet Place  1 tablet (0.4 mg total) under the tongue every 5 (five) minutes as needed for chest pain. 25 tablet 6   omeprazole (PRILOSEC) 40 MG capsule Take 1 capsule (40 mg total) by mouth daily. 90 capsule 3   rosuvastatin (CRESTOR) 20 MG tablet Take 1 tablet (20 mg total) by mouth daily. 90 tablet 0   No current facility-administered medications on file prior to visit.    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    noted on EKG March 2013   CAD (coronary artery disease)    s/p atherectomy, PTCA/DES x2 mid & distal RCA 06/30/12   Carotid artery occlusion    Chest pain    Complication of anesthesia    heart rate dropping with propofol last procedure   COPD (chronic obstructive pulmonary disease) ([place])    Dyspnea    ED (erectile dysfunction)    Esophageal stricture    External hemorrhoids without mention of complication    GERD (gastroesophageal reflux disease)    Headache(784.0)    Hiatal hernia    History of kidney stones    HLD (hyperlipidemia)    HTN (hypertension)    Myocardial infarction ([place])    1996   Personal history of colonic polyps 03/12/2010   TUBULAR ADENOMA   Presence of permanent cardiac pacemaker    Sleep apnea    wears CPAP   Tobacco abuse    Tubular adenoma of colon    Type 2 diabetes mellitus ([place])     Past Surgical History:  Procedure Laterality Date   ANGIOPLASTY     X3   BACK SURGERY     CARDIAC CATHETERIZATION     8 years ago stents   COLONOSCOPY N/A 02/16/2017   Procedure: COLONOSCOPY;  Surgeon: Jerene Bears, MD;  Location: [facility];  Service: Gastroenterology;  Laterality: N/A;   COLONOSCOPY     CORONARY ARTERY BYPASS GRAFT N/A 07/19/2020   Procedure: CORONARY ARTERY BYPASS GRAFTING (CABG) TIMES THREE USING LEFT INTERNAL MAMMARY ARTERY AND RIGHT GREATER SAPHENOUS VEIN HARVESTED ENDOSCOPICALLY;  Surgeon: Gaye Pollack, MD;  Location: [facility];  Service: Open Heart Surgery;  Laterality: N/A;   INSERT / REPLACE / REMOVE PACEMAKER  06/20/2018   LEFT HEART CATH AND CORONARY ANGIOGRAPHY N/A 11/05/2016   Procedure: Left Heart Cath and Coronary Angiography;  Surgeon: Nelva Bush, MD;  Location: [facility];  Service: Cardiovascular;  Laterality: N/A;   LEFT HEART CATH AND CORONARY ANGIOGRAPHY N/A 07/12/2020   Procedure: LEFT HEART CATH  AND CORONARY ANGIOGRAPHY;  Surgeon: Burnell Blanks, MD;  Location: [facility];  Service: Cardiovascular;  Laterality: N/A;   LUMBAR LAMINECTOMY/DECOMPRESSION MICRODISCECTOMY N/A 05/12/2018   Procedure: Microlumbar decompression L2-3, possible L1-2;  Surgeon: Susa Day, MD;  Location: [facility];  Service: Orthopedics;  Laterality: N/A;  2 hrs, Microlumbar decompression L2-3, possible L1-2   PACEMAKER IMPLANT N/A 06/20/2018   Procedure: PACEMAKER IMPLANT;  Surgeon: Evans Lance, MD;  Location: [facility] INV[facility]IVE [facility]  [facility];  Service: Cardiovascular;  Laterality: N/A;   PERCUTANEOUS CORONARY ROTOBLATOR INTERVENTION (PCI-R) N/A 06/30/2012   Procedure: PERCUTANEOUS CORONARY ROTOBLATOR INTERVENTION (PCI-R);  Surgeon: Burnell Blanks, MD;  Location: [facility];  Service: Cardiovascular;  Laterality: N/A;   TEE WITHOUT CARDIOVERSION N/A 07/19/2020   Procedure: TRANSESOPHAGEAL ECHOCARDIOGRAM (TEE);  Surgeon: Gaye Pollack, MD;  Location: [facility];  Service: Open Heart Surgery;  Laterality: N/A;   VIDEO BRONCHOSCOPY  08/13/2011   Procedure: VIDEO BRONCHOSCOPY WITHOUT FLUORO;  Surgeon: Tanda Rockers, MD;  Location: [facility];  Service: Cardiopulmonary;  Laterality: Bilateral;    Social History   Tobacco Use  Smoking Status Former   Packs/day: 0.50   Years: 40.00   Total pack years: 20.00   Types: Cigarettes   Quit date: 08/25/2011   Years since quitting: 10.2  Smokeless Tobacco Never    Social History   Substance and Sexual Activity  Alcohol Use Not Currently    Family History  Problem Relation Age of Onset   Diabetes Mother        and brother    Kidney failure Mother    Heart disease Father        hx of MI   Breast cancer Sister    Colon cancer Neg Hx    Stomach cancer Neg Hx    Esophageal cancer Neg Hx    Rectal cancer Neg Hx     Reviw of Systems:  Reviewed in the HPI.  All other systems are negative.    Physical Exam: Blood pressure 118/78, pulse 67,  height '5\' 11"'$  (1.803 m), weight 186 lb (84.4 kg), SpO2 98 %.  GEN:  Well nourished, well developed in no acute distress HEENT: Normal NECK: No JVD; No carotid bruits LYMPHATICS: No lymphadenopathy CARDIAC: RRR , no murmurs, rubs, gallops, well healed sternotomy , pacer  RESPIRATORY:  Clear to auscultation without rales, wheezing or rhonchi  ABDOMEN: Soft, non-tender, non-distended MUSCULOSKELETAL:  No edema; No deformity  SKIN: Warm and dry NEUROLOGIC:  Alert and oriented x 3   ECG:     November 11, 2021: Atrial sensed ventricular paced rhythm.  Heart rate 67.  Assessment / Plan:   1. COVID :        2. CAD  -      s/p CABG .  Still has some sternal wall tenderness.  Not having any angina.   3. Hyperlipidemia:        Last LDL was 70.  We will check lipids today.  He is goal is between 64 and 70.  We discussed possibly starting him on a PCSK9 inhibitor to achieve a LDL of around 55.   4.  Obstructive sleep apnea:         Return to see me in 1 year   Mertie Moores, MD  11/11/2021 4:30 PM    [place],  [place], [place]  [phone] Pager [phone] Phone: [phone]; Fax: [phone]

## 2021-11-11 NOTE — Progress Notes (Signed)
Patient ID: Daniel Carey, male   DOB: March 07, 1951, 71 y.o.   MRN: [id number]  HPI  Daniel Carey is a 71 y.o.-year-old male, returning for f/u for hypothyroidism, dx 2012 and DM2, diet-controlled, non-insulin-dependent, with complications (CAD - s/p CABG 07/2020, carotid atherosclerosis).  Last visit 4.5 months ago.  Interim history: No increased urination, blurry vision. He goes to the gym 2 mo ago: [employer]. Also changed diet. He reduced salt, meat, less starches. After this, he stopped metformin.  He mentions that this was giving him nausea, also.  Pt. has been dx with hypothyroidism in 2012 >> he was on levothyroxine up to 200 mcg in 2016 >> but did not feel well so he stopped.  Afterwards, his TFTs fluctuated but in the last 3 years they were mostly in the normal range.  Reviewed previous TFTs: Lab Results  Component Value Date   TSH 3.73 06/25/2021   TSH 5.070 (H) 03/07/2021   TSH 3.96 03/20/2020   TSH 3.83 08/22/2019   TSH 3.99 01/31/2018   TSH 4.50 08/02/2017   TSH 4.89 (H) 12/08/2016   TSH 4.13 03/06/2016   TSH 9.84 (H) 09/05/2015   TSH 0.23 (L) 05/07/2015   FREET4 1.04 03/07/2021   FREET4 0.75 03/20/2020   FREET4 0.80 01/31/2018   FREET4 0.65 08/02/2017   FREET4 0.83 12/08/2016   FREET4 0.67 03/06/2016   FREET4 0.66 09/05/2015   FREET4 1.25 05/07/2015   FREET4 0.79 08/02/2014    Lab Results  Component Value Date   T3FREE 2.7 03/07/2021   T3FREE 3.0 03/20/2020   T3FREE 2.8 01/31/2018   T3FREE 2.6 08/02/2017   T3FREE 3.3 12/08/2016   T3FREE 3.1 03/06/2016   T3FREE 3.2 09/05/2015   T3FREE 3.9 05/07/2015   T3FREE 2.9 08/02/2014   T3FREE 2.8 07/20/2011   Pt denies: - feeling nodules in neck - hoarseness - dysphagia - choking   DM2:  Reviewed HbA1c levels: Lab Results  Component Value Date   HGBA1C 7.0 (H) 06/25/2021   HGBA1C 6.6 (A) 03/07/2021   HGBA1C 6.2 (A) 11/01/2020  07/03/2016: HbA1c 6.8%  He was on: -  Metformin 500 mg 2x a day >> stopped 2/2 nausea  - Metformin ER 1000 mg with dinner >> nausea  Currently on:  - Metformin ER 500 mg twice a day with meals >> 3x a day -he had nausea with taking 1000 mg at the same time >> stopped 08/2021  He continues to limit sweets and to not eat fried foods.  However, he is eating cereal without milk as a snack in the evening.  He checks his sugars 3x a day: - am: 140, 159, 179 >> 120-140 >> 138-145, 147 >> 100-165 >> 119  - 2h after b'fast: n/c >> 119, 144 - lunch: 135, 140-160 >> 80-150 >> 120s >> 80, 96-117 >> 106-111 - 2h after lunch: 100 >> n/c >> 101, 110, 118 - dinner: 120, 121 >> 150-174 >> 90-140 >> 120s >> 90s-120 >> 94, 114 - 2h after dinner: n/c - bedtime: n/c Feels sugars <80. Lowest: 92 >> 80s >> 94. Highest: 147 >> 160s >> 144.  Meter: ReliOn >> ReliOn Premier Blu >> Verio Reflect  + CKD. Last BUN/Cr: Lab Results  Component Value Date   BUN 22 06/25/2021   BUN 18 11/11/2020   BUN 22 07/23/2020   CREATININE 1.20 06/25/2021   CREATININE 1.23 11/11/2020   CREATININE 1.23 07/23/2020  He is off ACE inhibitor/ARB due to history  of high potassium.  Latest potassium levels have been normal: Lab Results  Component Value Date   K 4.6 06/25/2021   K 4.8 11/11/2020   K 4.3 07/23/2020   K 4.0 07/22/2020   K 4.3 07/21/2020   + HL: Lab Results  Component Value Date   CHOL 148 06/25/2021   HDL 49.80 06/25/2021   LDLCALC 70 06/25/2021   LDLDIRECT 107.0 02/27/2013   TRIG 141.0 06/25/2021   CHOLHDL 3 06/25/2021  On Crestor 20, Niacin, and fenofibrate.  Last eye exam in  2022: reportedly No DR, no glaucoma, + incipient cataracts.  He sees Dr Acie Fredrickson >> had angioplasy x3 in the past, and 2 stents placed. He he has a history of an AMI 37 (mild). He has a Mobitz 1 AVB. Had a catheterization in 10/2016 >> partial blockages. He has OSA and wears a CPAP He had a colonoscopy 02/2017 >> E coli sepsis. He usually stays active playing  golf, but not since his CABG.  ROS: + See HPI  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    noted on EKG March 2013   CAD (coronary artery disease)    s/p atherectomy, PTCA/DES x2 mid & distal RCA 06/30/12   Carotid artery occlusion    Chest pain    Complication of anesthesia    heart rate dropping with propofol last procedure   COPD (chronic obstructive pulmonary disease) ([place])    Dyspnea    ED (erectile dysfunction)    Esophageal stricture    External hemorrhoids without mention of complication    GERD (gastroesophageal reflux disease)    Headache(784.0)    Hiatal hernia    History of kidney stones    HLD (hyperlipidemia)    HTN (hypertension)    Myocardial infarction ([place])    1996   Personal history of colonic polyps 03/12/2010   TUBULAR ADENOMA   Presence of permanent cardiac pacemaker    Sleep apnea    wears CPAP   Tobacco abuse    Tubular adenoma of colon    Type 2 diabetes mellitus ([place])    Past Surgical History:  Procedure Laterality Date   ANGIOPLASTY     X3   BACK SURGERY     CARDIAC CATHETERIZATION     8 years ago stents   COLONOSCOPY N/A 02/16/2017   Procedure: COLONOSCOPY;  Surgeon: Jerene Bears, MD;  Location: [facility];  Service: Gastroenterology;  Laterality: N/A;   COLONOSCOPY     CORONARY ARTERY BYPASS GRAFT N/A 07/19/2020   Procedure: CORONARY ARTERY BYPASS GRAFTING (CABG) TIMES THREE USING LEFT INTERNAL MAMMARY ARTERY AND RIGHT GREATER SAPHENOUS VEIN HARVESTED ENDOSCOPICALLY;  Surgeon: Gaye Pollack, MD;  Location: [facility];  Service: Open Heart Surgery;  Laterality: N/A;   INSERT / REPLACE / REMOVE PACEMAKER  06/20/2018   LEFT HEART CATH AND CORONARY ANGIOGRAPHY N/A 11/05/2016   Procedure: Left Heart Cath and Coronary Angiography;  Surgeon: Nelva Bush, MD;  Location: [place];  Service: Cardiovascular;  Laterality: N/A;   LEFT HEART CATH AND CORONARY ANGIOGRAPHY N/A 07/12/2020    Procedure: LEFT HEART CATH AND CORONARY ANGIOGRAPHY;  Surgeon: Burnell Blanks, MD;  Location: [place] [facility];  Service: Cardiovascular;  Laterality: N/A;   LUMBAR LAMINECTOMY/DECOMPRESSION MICRODISCECTOMY N/A 05/12/2018   Procedure: Microlumbar decompression L2-3, possible L1-2;  Surgeon: Susa Day, MD;  Location: [facility];  Service: Orthopedics;  Laterality: N/A;  2 hrs, Microlumbar decompression L2-3, possible L1-2   PACEMAKER IMPLANT N/A 06/20/2018   Procedure: PACEMAKER IMPLANT;  Surgeon: Evans Lance, MD;  Location: [facility];  Service: Cardiovascular;  Laterality: N/A;   PERCUTANEOUS CORONARY ROTOBLATOR INTERVENTION (PCI-R) N/A 06/30/2012   Procedure: PERCUTANEOUS CORONARY ROTOBLATOR INTERVENTION (PCI-R);  Surgeon: Burnell Blanks, MD;  Location: [facility];  Service: Cardiovascular;  Laterality: N/A;   TEE WITHOUT CARDIOVERSION N/A 07/19/2020   Procedure: TRANSESOPHAGEAL ECHOCARDIOGRAM (TEE);  Surgeon: Gaye Pollack, MD;  Location: [facility];  Service: Open Heart Surgery;  Laterality: N/A;   VIDEO BRONCHOSCOPY  08/13/2011   Procedure: VIDEO BRONCHOSCOPY WITHOUT FLUORO;  Surgeon: Tanda Rockers, MD;  Location: [facility];  Service: Cardiopulmonary;  Laterality: Bilateral;   History   Social History   Marital Status: Married    Spouse Name: N/A    Number of Children: 1   Occupational History   Radio broadcast assistant    Social History Main Topics   Smoking status: Former Smoker -- 0.50 packs/day for 40 years    Types: Cigarettes    Quit date: 08/25/2011   Smokeless tobacco: Never Used   Alcohol Use: 0.0 oz/week     Comment: rare   Drug Use: No   Current Outpatient Medications on File Prior to Visit  Medication Sig Dispense Refill   acetaminophen (TYLENOL) 500 MG tablet Take 1,000 mg by mouth every 6 (six) hours as needed (pain).      albuterol (VENTOLIN HFA) 108 (90 Base) MCG/ACT inhaler INHALE 2 PUFFS INTO THE LUNGS EVERY 6 HOURS AS NEEDED FOR  WHEEZE OR SHORTNESS OF BREATH 8.5 each 3   aspirin EC 325 MG EC tablet Take 1 tablet (325 mg total) by mouth daily. 30 tablet 0   Choline Fenofibrate (FENOFIBRIC ACID) 135 MG CPDR TAKE 1 CAPSULE BY MOUTH EVERY DAY 90 capsule 3   famotidine (PEPCID) 20 MG tablet Take 20 mg by mouth daily as needed for heartburn.     hydrochlorothiazide (HYDRODIURIL) 25 MG tablet TAKE 0.5 TABLETS (12.5 MG TOTAL) BY MOUTH DAILY AS NEEDED. 30 tablet 0   ipratropium (ATROVENT) 0.06 % nasal spray Place 2 sprays into both nostrils 4 (four) times daily. @ sprays at night daiyl as needed. 15 mL 11   metFORMIN (GLUCOPHAGE-XR) 500 MG 24 hr tablet Take 1 tablet (500 mg total) by mouth 3 (three) times daily after meals. 270 tablet 3   metoprolol tartrate (LOPRESSOR) 25 MG tablet TAKE 1/2 TABLET TWICE A DAY BY MOUTH 45 tablet 1   niacin 500 MG tablet Take 500 mg by mouth at bedtime.     nitroGLYCERIN (NITROSTAT) 0.4 MG SL tablet Place 1 tablet (0.4 mg total) under the tongue every 5 (five) minutes as needed for chest pain. 25 tablet 6   omeprazole (PRILOSEC) 40 MG capsule Take 1 capsule (40 mg total) by mouth daily. 90 capsule 3   rosuvastatin (CRESTOR) 20 MG tablet Take 1 tablet (20 mg total) by mouth daily. 90 tablet 0   No current facility-administered medications on file prior to visit.   Allergies  Allergen Reactions   Amlodipine Besylate Other (See Comments)    Headache, weakness, dizziness   Propofol Other (See Comments)    Heart rate dropped     Lisinopril Cough and Rash   Metoprolol     Lowered heart rate too much    Rosuvastatin Other (See Comments)     Corncern for muscle aches but he is tolerating  now    Family History  Problem Relation Age of Onset   Diabetes Mother        and brother    Kidney failure Mother    Heart disease Father        hx of MI   Breast cancer Sister    Colon cancer Neg Hx    Stomach cancer Neg Hx    Esophageal cancer Neg Hx    Rectal cancer Neg Hx    PE: BP 110/70 (BP  Location: Right Arm, Patient Position: Sitting, Cuff Size: Normal)   Pulse 66   Ht '5\' 11"'$  (1.803 m)   Wt 186 lb 12.8 oz (84.7 kg)   SpO2 95%   BMI 26.05 kg/m  Wt Readings from Last 3 Encounters:  11/11/21 186 lb 12.8 oz (84.7 kg)  09/10/21 187 lb (84.8 kg)  07/14/21 191 lb 3.2 oz (86.7 kg)   Constitutional: Slightly overweight, in NAD Eyes: no exophthalmos ENT: moist mucous membranes, no masses palpated in neck, no cervical lymphadenopathy Cardiovascular: RRR, No MRG Respiratory: CTA B Musculoskeletal: no deformities Skin: moist, warm, no rashes Neurological: no tremor with outstretched hands Diabetic Foot Exam - Simple   Simple Foot Form Diabetic Foot exam was performed with the following findings: Yes 11/11/2021  3:45 PM  Visual Inspection No deformities, no ulcerations, no other skin breakdown bilaterally: Yes Sensation Testing Intact to touch and monofilament testing bilaterally: Yes Pulse Check Posterior Tibialis and Dorsalis pulse intact bilaterally: Yes Comments B toenail fungus    ASSESSMENT: 1. Mild hypothyroidism  2. DM2, diet- controlled - CAD, s/p CABG 07/19/2020 - Carotid atherosclerosis - per carotid ultrasound 08/2021 -No need for surgery at  3.  Overweight  PLAN:  1. Patient with history of mild hypothyroidism, not requiring levothyroxine yet -Latest TFTs were normal: Lab Results  Component Value Date   TSH 3.73 06/25/2021  -He would prefer to continue without levothyroxine if possible -He has good energy and no hypothyroid signs or symptoms -We will recheck his TFTs at next visit  2. DM2 -Patient with controlled type 2 diabetes, but worse control in spring 2022, when HbA1c returned at 7.3%.  At that time, he was started on low-dose metformin in the hospital.  He could not tolerate regular metformin due to nausea.  He tolerates metformin ER at lower doses well, but he also had nausea with 1000 mg metformin ER taken at the same time. -At last  visit, sugars were high in the morning, usually above target and they were excellent later in the day.  We discussed about increasing his metformin to 3 tablets a day.  We also discussed about improving his meals.  He was having snacks at night including popcorn and cereals, and I strongly advised him to stop these. -At today's visit, he mentions that he started to improve his diet and also started more consistent exercise.  His sugars improve and he stopped his metformin.  Reviewing his meter download, all of his blood sugars are at goal so for now we do not need to start antidiabetic medication.  I congratulated him and strongly advised him to continue. - I advised him to: Patient Instructions  Please continue off Metformin.  Try: Normal Dental   Please return in 4 months with your sugar log.   - we checked his HbA1c: 6.4% (lower) - advised to check sugars at different times of the day - 1x a day, rotating check times - advised for yearly eye exams >> he  is UTD - return to clinic in 4 months  3.  Overweight -He continues to limit sweets and fried foods, but since last visit he also mostly eliminated meat and greatly reduced salt and starches. -He gained 8 pounds before the last 2 visits combined -He lost 5 pounds since last visit  Philemon Kingdom, MD PhD [facility]

## 2021-11-11 NOTE — Patient Instructions (Signed)
Medication Instructions:  Your physician recommends that you continue on your current medications as directed. Please refer to the Current Medication list given to you today.  *If you need a refill on your cardiac medications before your next appointment, please call your pharmacy*   Lab Work: lipids If you have labs (blood work) drawn today and your tests are completely normal, you will receive your results only by: [place] (if you have MyChart) OR A paper copy in the mail If you have any lab test that is abnormal or we need to change your treatment, we will call you to review the results.     Follow-Up: At [facility], you and your health needs are our priority.  As part of our continuing mission to provide you with exceptional heart care, we have created designated Provider Care Teams.  These Care Teams include your primary Cardiologist (physician) and Advanced Practice Providers (APPs -  Physician Assistants and Nurse Practitioners) who all work together to provide you with the care you need, when you need it.  We recommend signing up for the patient portal called "MyChart".  Sign up information is provided on this After Visit Summary.  MyChart is used to connect with patients for Virtual Visits (Telemedicine).  Patients are able to view lab/test results, encounter notes, upcoming appointments, etc.  Non-urgent messages can be sent to your provider as well.   To learn more about what you can do with MyChart, go to [email or web address].    Your next appointment:   1 year(s)  The format for your next appointment:   In Person  Provider:   Mertie Moores, MD {

## 2021-11-12 LAB — LIPID PANEL
Chol/HDL Ratio: 3.5 ratio (ref 0.0–5.0)
Cholesterol, Total: 161 mg/dL (ref 100–199)
HDL: 46 mg/dL (ref >39)
LDL Chol Calc (NIH): 89 mg/dL (ref 0–99)
Triglycerides: 148 mg/dL (ref 0–149)
VLDL Cholesterol Cal: 26 mg/dL (ref 5–40)

## 2021-11-12 LAB — HEMOGLOBIN A1C
Est. average glucose Bld gHb Est-mCnc: 148 mg/dL
Hgb A1c MFr Bld: 6.8 % — ABNORMAL HIGH (ref 4.8–5.6)

## 2021-11-19 ENCOUNTER — Telehealth: Payer: Self-pay

## 2021-11-19 DIAGNOSIS — Z79899 Other long term (current) drug therapy: Secondary | ICD-10-CM

## 2021-11-19 MED ORDER — EZETIMIBE 10 MG PO TABS: 10.0000 mg | ORAL_TABLET | Freq: Every day | ORAL | 3 refills | Status: DC

## 2021-11-19 NOTE — Telephone Encounter (Signed)
Called and spoke with patient to review results and recommendations. Agrees with plan. Zetia sent to pharmacy on file, labs ordered and scheduled for 02/19/22.

## 2021-11-19 NOTE — Telephone Encounter (Signed)
-----   Message from Thayer Headings, MD sent at 11/18/2021 11:06 AM EDT ----- HBA1C is mildly elevated LDL is higher than we would like  ( goal LDL is now 55)  Add Zetia 10 mg a day  Check lipids, ALT in 3 months

## 2021-11-20 ENCOUNTER — Other Ambulatory Visit: Payer: Self-pay | Admitting: *Deleted

## 2021-11-20 NOTE — Patient Outreach (Signed)
[facility]) Care Management  11/20/2021  Daniel Carey 03-16-1951 [id number]    Care Management   Outreach Note  11/20/2021 Name: Daniel Carey MRN: [id number] DOB: [birth year]  Unsuccessful outreach #1  Follow Up Plan:  A HIPAA compliant phone message was left for the patient providing contact information and requesting a return call.  The care management team will reach out to the patient again over the next 7 days.  Spoke with spouse Hoyle Sauer and left message for pt to return a call.  Raina Mina, RN Care Management Coordinator [place] Office [phone]

## 2021-11-24 DIAGNOSIS — M25552 Pain in left hip: Secondary | ICD-10-CM | POA: Insufficient documentation

## 2021-11-28 DIAGNOSIS — M1612 Unilateral primary osteoarthritis, left hip: Secondary | ICD-10-CM | POA: Diagnosis not present

## 2021-11-28 DIAGNOSIS — M25552 Pain in left hip: Secondary | ICD-10-CM | POA: Diagnosis not present

## 2021-12-02 ENCOUNTER — Telehealth: Payer: Self-pay | Admitting: Cardiology

## 2021-12-02 NOTE — Telephone Encounter (Signed)
Patient's wife call stating he needs a new C-pap machine, she is wondering if something can be sent to [facility] in [place] requesting a new machine.  She states the machine he has now is about 70 years old and they can't get any supplies for it.

## 2021-12-06 ENCOUNTER — Other Ambulatory Visit: Payer: Self-pay | Admitting: Internal Medicine

## 2021-12-08 ENCOUNTER — Other Ambulatory Visit: Payer: Self-pay

## 2021-12-10 DIAGNOSIS — M25552 Pain in left hip: Secondary | ICD-10-CM | POA: Diagnosis not present

## 2021-12-12 NOTE — Telephone Encounter (Signed)
Patient is scheduled for November 16th at 9am. Pt is agreeable to treatment.

## 2021-12-12 NOTE — Telephone Encounter (Signed)
Per Dr Radford Pax, I have not seen this patient for 3 years so needs an OV first

## 2021-12-21 ENCOUNTER — Other Ambulatory Visit: Payer: Self-pay | Admitting: Cardiovascular Disease

## 2021-12-25 ENCOUNTER — Other Ambulatory Visit: Payer: Self-pay | Admitting: Family Medicine

## 2021-12-27 ENCOUNTER — Other Ambulatory Visit: Payer: Self-pay | Admitting: Cardiovascular Disease

## 2022-01-01 ENCOUNTER — Ambulatory Visit (INDEPENDENT_AMBULATORY_CARE_PROVIDER_SITE_OTHER): Payer: Medicare Other

## 2022-01-01 ENCOUNTER — Ambulatory Visit: Payer: Self-pay

## 2022-01-01 DIAGNOSIS — I441 Atrioventricular block, second degree: Secondary | ICD-10-CM

## 2022-01-01 LAB — CUP PACEART REMOTE DEVICE CHECK
Battery Remaining Longevity: 70 mo
Battery Remaining Percentage: 65 %
Battery Voltage: 2.99 V
Brady Statistic AP VP Percent: 7.8 %
Brady Statistic AP VS Percent: 1 %
Brady Statistic AS VP Percent: 92 %
Brady Statistic AS VS Percent: 1 %
Brady Statistic RA Percent Paced: 7.1 %
Brady Statistic RV Percent Paced: 99 %
Date Time Interrogation Session: 20230914020013
Implantable Lead Implant Date: 20200302
Implantable Lead Implant Date: 20200302
Implantable Lead Location: 753859
Implantable Lead Location: 753860
Implantable Lead Model: 3830
Implantable Pulse Generator Implant Date: 20200302
Lead Channel Impedance Value: 530 Ohm
Lead Channel Impedance Value: 540 Ohm
Lead Channel Pacing Threshold Amplitude: 0.75 V
Lead Channel Pacing Threshold Amplitude: 0.75 V
Lead Channel Pacing Threshold Pulse Width: 0.5 ms
Lead Channel Pacing Threshold Pulse Width: 0.5 ms
Lead Channel Sensing Intrinsic Amplitude: 12 mV
Lead Channel Sensing Intrinsic Amplitude: 5 mV
Lead Channel Setting Pacing Amplitude: 2 V
Lead Channel Setting Pacing Amplitude: 2.5 V
Lead Channel Setting Pacing Pulse Width: 0.5 ms
Lead Channel Setting Sensing Sensitivity: 4 mV
Pulse Gen Model: 2272
Pulse Gen Serial Number: 9114136

## 2022-01-01 NOTE — Patient Outreach (Signed)
  Care Coordination   01/01/2022 Name: Daniel Carey MRN: [id number] DOB: 1951/01/06   Care Coordination Outreach Attempts:  A second unsuccessful outreach was attempted today to offer the patient with information about available care coordination services as a benefit of their health plan.     Follow Up Plan:  Additional outreach attempts will be made to offer the patient care coordination information and services.   Encounter Outcome:  No Answer  Care Coordination Interventions Activated:  No   Care Coordination Interventions:  No, not indicated    Daneen Schick, BSW, CDP Social Worker, Certified Dementia Practitioner [facility] Care Management  Care Coordination [phone]

## 2022-01-09 ENCOUNTER — Ambulatory Visit: Payer: Self-pay

## 2022-01-09 NOTE — Patient Outreach (Signed)
  Care Coordination   01/09/2022 Name: Daniel Carey MRN: [id number] DOB: 05-20-1950   Care Coordination Outreach Attempts:  A third unsuccessful outreach was attempted today to offer the patient with information about available care coordination services as a benefit of their health plan.   Follow Up Plan:  No further outreach attempts will be made at this time. We have been unable to contact the patient to offer or enroll patient in care coordination services  Encounter Outcome:  No Answer  Care Coordination Interventions Activated:  No   Care Coordination Interventions:  No, not indicated    Daneen Schick, BSW, CDP Social Worker, Certified Dementia Practitioner [facility] [phone]

## 2022-01-12 ENCOUNTER — Encounter: Payer: Self-pay | Admitting: *Deleted

## 2022-01-14 DIAGNOSIS — M25552 Pain in left hip: Secondary | ICD-10-CM | POA: Diagnosis not present

## 2022-01-14 DIAGNOSIS — M1612 Unilateral primary osteoarthritis, left hip: Secondary | ICD-10-CM | POA: Diagnosis not present

## 2022-01-14 NOTE — Progress Notes (Signed)
Remote pacemaker transmission.   

## 2022-01-20 ENCOUNTER — Ambulatory Visit: Payer: Medicare Other | Attending: Internal Medicine | Admitting: Internal Medicine

## 2022-01-20 ENCOUNTER — Encounter: Payer: Self-pay | Admitting: Internal Medicine

## 2022-01-20 VITALS — BP 138/78 | HR 76 | Ht 70.0 in | Wt 183.0 lb

## 2022-01-20 DIAGNOSIS — I459 Conduction disorder, unspecified: Secondary | ICD-10-CM | POA: Diagnosis not present

## 2022-01-20 NOTE — Patient Instructions (Signed)
Medication Instructions:  Your physician recommends that you continue on your current medications as directed. Please refer to the Current Medication list given to you today.   Labwork: None today  Testing/Procedures: None today  Follow-Up: 1 year Dr.Taylor  Any Other Special Instructions Will Be Listed Below (If Applicable).  If you need a refill on your cardiac medications before your next appointment, please call your pharmacy.

## 2022-01-20 NOTE — Progress Notes (Signed)
HPI [name] returns today for followup of CHB, s/p PPM insertion, HTN, and CAD. He has had mild exertional chest pressure. He has undergone CABG about 18 months ago . He notes that his bp is low. He denies chest pain though he still has incisional pain.  Allergies  Allergen Reactions   Amlodipine Besylate Other (See Comments)    Headache, weakness, dizziness   Propofol Other (See Comments)    Heart rate dropped     Lisinopril Cough and Rash   Metoprolol     Lowered heart rate too much    Rosuvastatin Other (See Comments)     Corncern for muscle aches but he is tolerating now      Current Outpatient Medications  Medication Sig Dispense Refill   acetaminophen (TYLENOL) 500 MG tablet Take 1,000 mg by mouth every 6 (six) hours as needed (pain).      albuterol (VENTOLIN HFA) 108 (90 Base) MCG/ACT inhaler INHALE 2 PUFFS INTO THE LUNGS EVERY 6 HOURS AS NEEDED FOR WHEEZE OR SHORTNESS OF BREATH 8.5 each 3   aspirin EC 81 MG tablet Take 81 mg by mouth daily. Swallow whole.     celecoxib (CELEBREX) 200 MG capsule Take 200 mg by mouth as needed for mild pain (hip pain).     Choline Fenofibrate (FENOFIBRIC ACID) 135 MG CPDR TAKE 1 CAPSULE BY MOUTH EVERY DAY 90 capsule 3   ezetimibe (ZETIA) 10 MG tablet Take 1 tablet (10 mg total) by mouth daily. 90 tablet 3   famotidine (PEPCID) 20 MG tablet Take 20 mg by mouth daily as needed for heartburn.     hydrochlorothiazide (HYDRODIURIL) 25 MG tablet TAKE 1/2 TABLET (12.5 MG TOTAL) BY MOUTH DAILY AS NEEDED. 90 tablet 1   ipratropium (ATROVENT) 0.06 % nasal spray Place 2 sprays into both nostrils 4 (four) times daily. @ sprays at night daiyl as needed. 15 mL 11   metoprolol tartrate (LOPRESSOR) 25 MG tablet TAKE 1/2 TABLET TWICE A DAY BY MOUTH 45 tablet 1   nitroGLYCERIN (NITROSTAT) 0.4 MG SL tablet Place 1 tablet (0.4 mg total) under the tongue every 5 (five) minutes as needed for chest pain. 25 tablet 6   omeprazole (PRILOSEC) 40 MG capsule  Take 1 capsule (40 mg total) by mouth daily. 90 capsule 3   rosuvastatin (CRESTOR) 20 MG tablet TAKE 1 TABLET BY MOUTH EVERY DAY 90 tablet 3   No current facility-administered medications for this visit.     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    noted on EKG March 2013   CAD (coronary artery disease)    s/p atherectomy, PTCA/DES x2 mid & distal RCA 06/30/12   Carotid artery occlusion    Chest pain    Complication of anesthesia    heart rate dropping with propofol last procedure   COPD (chronic obstructive pulmonary disease) ([place])    Dyspnea    ED (erectile dysfunction)    Esophageal stricture    External hemorrhoids without mention of complication    GERD (gastroesophageal reflux disease)    Headache(784.0)    Hiatal hernia    History of kidney stones    HLD (hyperlipidemia)    HTN (hypertension)    Myocardial infarction ([place])    1996   Personal history of colonic polyps 03/12/2010   TUBULAR ADENOMA   Presence of permanent cardiac pacemaker    Sleep apnea  wears CPAP   Tobacco abuse    Tubular adenoma of colon    Type 2 diabetes mellitus ([place])     ROS:   All systems reviewed and negative except as noted in the HPI.   Past Surgical History:  Procedure Laterality Date   ANGIOPLASTY     X3   BACK SURGERY     CARDIAC CATHETERIZATION     8 years ago stents   COLONOSCOPY N/A 02/16/2017   Procedure: COLONOSCOPY;  Surgeon: Jerene Bears, MD;  Location: [facility];  Service: Gastroenterology;  Laterality: N/A;   COLONOSCOPY     CORONARY ARTERY BYPASS GRAFT N/A 07/19/2020   Procedure: CORONARY ARTERY BYPASS GRAFTING (CABG) TIMES THREE USING LEFT INTERNAL MAMMARY ARTERY AND RIGHT GREATER SAPHENOUS VEIN HARVESTED ENDOSCOPICALLY;  Surgeon: Gaye Pollack, MD;  Location: [place];  Service: Open Heart Surgery;  Laterality: N/A;   INSERT / REPLACE / REMOVE PACEMAKER  06/20/2018   LEFT HEART CATH AND CORONARY ANGIOGRAPHY  N/A 11/05/2016   Procedure: Left Heart Cath and Coronary Angiography;  Surgeon: Nelva Bush, MD;  Location: [place] [facility];  Service: Cardiovascular;  Laterality: N/A;   LEFT HEART CATH AND CORONARY ANGIOGRAPHY N/A 07/12/2020   Procedure: LEFT HEART CATH AND CORONARY ANGIOGRAPHY;  Surgeon: Burnell Blanks, MD;  Location: [place] [facility];  Service: Cardiovascular;  Laterality: N/A;   LUMBAR LAMINECTOMY/DECOMPRESSION MICRODISCECTOMY N/A 05/12/2018   Procedure: Microlumbar decompression L2-3, possible L1-2;  Surgeon: Susa Day, MD;  Location: [place];  Service: Orthopedics;  Laterality: N/A;  2 hrs, Microlumbar decompression L2-3, possible L1-2   PACEMAKER IMPLANT N/A 06/20/2018   Procedure: PACEMAKER IMPLANT;  Surgeon: Evans Lance, MD;  Location: [place] [facility];  Service: Cardiovascular;  Laterality: N/A;   PERCUTANEOUS CORONARY ROTOBLATOR INTERVENTION (PCI-R) N/A 06/30/2012   Procedure: PERCUTANEOUS CORONARY ROTOBLATOR INTERVENTION (PCI-R);  Surgeon: Burnell Blanks, MD;  Location: [facility] C[facility];  Service: Cardiovascular;  Laterality: N/A;   TEE WITHOUT CARDIOVERSION N/A 07/19/2020   Procedure: TRANSESOPHAGEAL ECHOCARDIOGRAM (TEE);  Surgeon: Gaye Pollack, MD;  Location: [place];  Service: Open Heart Surgery;  Laterality: N/A;   VIDEO BRONCHOSCOPY  08/13/2011   Procedure: VIDEO BRONCHOSCOPY WITHOUT FLUORO;  Surgeon: Tanda Rockers, MD;  [facility];  Service: Cardiopulmonary;  Laterality: Bilateral;     Family History  Problem Relation Age of Onset   Diabetes Mother        and brother    Kidney failure Mother    Heart disease Father        hx of MI   Breast cancer Sister    Colon cancer Neg Hx    Stomach cancer Neg Hx    Esophageal cancer Neg Hx    Rectal cancer Neg Hx      Social History   Socioeconomic History   Marital status: Married    Spouse name: Not on file   Number of children: 1   Years of education: Not on file   Highest  education level: Not on file  Occupational History   Occupation: Radio broadcast assistant  Tobacco Use   Smoking status: Former    Packs/day: 0.50    Years: 40.00    Total pack years: 20.00    Types: Cigarettes    Quit date: 08/25/2011    Years since quitting: 10.4   Smokeless tobacco: Never  Vaping Use   Vaping Use: Never used  Substance and Sexual Activity   Alcohol use: Not  Currently   Drug use: No   Sexual activity: Not Currently  Other Topics Concern   Not on file  Social History Narrative   Not on file   Social Determinants of Health   Financial Resource Strain: Not on file  Food Insecurity: Not on file  Transportation Needs: Not on file  Physical Activity: Not on file  Stress: Not on file  Social Connections: Not on file  Intimate Partner Violence: Not on file     BP 138/78   Pulse 76   Ht '5\' 10"'$  (1.778 m)   Wt 183 lb (83 kg)   SpO2 95%   BMI 26.26 kg/m   Physical Exam:  Well appearing NAD HEENT: Unremarkable Neck:  No JVD, no thyromegally Lymphatics:  No adenopathy Back:  No CVA tenderness Lungs:  Clear with no wheezes HEART:  Regular rate rhythm, no murmurs, no rubs, no clicks Abd:  soft, positive bowel sounds, no organomegally, no rebound, no guarding Ext:  2 plus pulses, no edema, no cyanosis, no clubbing Skin:  No rashes no nodules Neuro:  CN II through XII intact, motor grossly intact  DEVICE  Normal device function.  See PaceArt for details.   Assess/Plan:  1. PAF - he has had post op atrial fib but none since. We will follow 2. CHB - he is s/p PPM and pacing 99% in the ventricle. 3. PPM -his st jude DDD PPM is working normally. 4. CAD - he has done well s/p cabg.  5. Dyslipidemia - he will continue statin therapy.   Carleene Overlie Shyteria Lewis,MD

## 2022-02-04 ENCOUNTER — Other Ambulatory Visit: Payer: Self-pay | Admitting: Internal Medicine

## 2022-02-05 ENCOUNTER — Ambulatory Visit: Payer: Medicare Other

## 2022-02-08 DIAGNOSIS — J019 Acute sinusitis, unspecified: Secondary | ICD-10-CM | POA: Diagnosis not present

## 2022-02-08 DIAGNOSIS — R059 Cough, unspecified: Secondary | ICD-10-CM | POA: Diagnosis not present

## 2022-02-08 DIAGNOSIS — J029 Acute pharyngitis, unspecified: Secondary | ICD-10-CM | POA: Diagnosis not present

## 2022-02-08 DIAGNOSIS — Z20822 Contact with and (suspected) exposure to covid-19: Secondary | ICD-10-CM | POA: Diagnosis not present

## 2022-02-09 DIAGNOSIS — Z85828 Personal history of other malignant neoplasm of skin: Secondary | ICD-10-CM | POA: Diagnosis not present

## 2022-02-09 DIAGNOSIS — X32XXXD Exposure to sunlight, subsequent encounter: Secondary | ICD-10-CM | POA: Diagnosis not present

## 2022-02-09 DIAGNOSIS — Z08 Encounter for follow-up examination after completed treatment for malignant neoplasm: Secondary | ICD-10-CM | POA: Diagnosis not present

## 2022-02-09 DIAGNOSIS — D225 Melanocytic nevi of trunk: Secondary | ICD-10-CM | POA: Diagnosis not present

## 2022-02-09 DIAGNOSIS — L570 Actinic keratosis: Secondary | ICD-10-CM | POA: Diagnosis not present

## 2022-02-09 DIAGNOSIS — C44519 Basal cell carcinoma of skin of other part of trunk: Secondary | ICD-10-CM | POA: Diagnosis not present

## 2022-02-12 ENCOUNTER — Ambulatory Visit: Payer: Medicare Other

## 2022-02-19 ENCOUNTER — Ambulatory Visit: Payer: Medicare Other | Attending: Cardiovascular Disease

## 2022-02-19 DIAGNOSIS — Z79899 Other long term (current) drug therapy: Secondary | ICD-10-CM

## 2022-02-19 LAB — LIPID PANEL
Chol/HDL Ratio: 2.7 ratio (ref 0.0–5.0)
Cholesterol, Total: 139 mg/dL (ref 100–199)
HDL: 52 mg/dL (ref >39)
LDL Chol Calc (NIH): 67 mg/dL (ref 0–99)
Triglycerides: 107 mg/dL (ref 0–149)
VLDL Cholesterol Cal: 20 mg/dL (ref 5–40)

## 2022-02-19 LAB — ALT: ALT: 20 IU/L (ref 0–44)

## 2022-02-20 ENCOUNTER — Telehealth: Payer: Self-pay | Admitting: Cardiovascular Disease

## 2022-02-20 DIAGNOSIS — E782 Mixed hyperlipidemia: Secondary | ICD-10-CM

## 2022-02-20 NOTE — Telephone Encounter (Signed)
Pt has reviewed results of labs and MD's comments via MyChart. Order placed at this time for lipid clinic referral.

## 2022-02-20 NOTE — Telephone Encounter (Signed)
-----   Message from Thayer Headings, MD sent at 02/20/2022  9:42 AM EDT ----- LDL is 67.   While I am very pleased with this result, the goal LDL has once again been lowered and his goal is 55. Please refer to the lipid clinic for consideration of PCSK9 inhibitor

## 2022-02-25 ENCOUNTER — Other Ambulatory Visit: Payer: Self-pay | Admitting: *Deleted

## 2022-02-25 ENCOUNTER — Ambulatory Visit (INDEPENDENT_AMBULATORY_CARE_PROVIDER_SITE_OTHER): Payer: Medicare Other

## 2022-02-25 ENCOUNTER — Telehealth: Payer: Self-pay | Admitting: *Deleted

## 2022-02-25 DIAGNOSIS — Z23 Encounter for immunization: Secondary | ICD-10-CM

## 2022-02-25 NOTE — Telephone Encounter (Signed)
Patient in office today for Flu Vaccine  Requesting refills Rx Valacyclovir, stated rash is back  Rx help last time he had it  Please advise

## 2022-02-26 ENCOUNTER — Other Ambulatory Visit: Payer: Self-pay | Admitting: *Deleted

## 2022-02-26 MED ORDER — VALACYCLOVIR HCL 1 G PO TABS: 1000.0000 mg | ORAL_TABLET | Freq: Two times a day (BID) | ORAL | 0 refills | Status: AC

## 2022-02-26 NOTE — Telephone Encounter (Signed)
Rx send to [facility]  

## 2022-02-26 NOTE — Telephone Encounter (Signed)
Ok to send in rx for valtrex '1000mg'$  bid x 10 days.  Algis Greenhouse. Jerline Pain, MD 02/26/2022 12:48 PM

## 2022-03-05 ENCOUNTER — Ambulatory Visit: Payer: Medicare Other | Attending: Cardiology | Admitting: Cardiology

## 2022-03-05 ENCOUNTER — Encounter: Payer: Self-pay | Admitting: Cardiology

## 2022-03-05 ENCOUNTER — Telehealth: Payer: Self-pay | Admitting: *Deleted

## 2022-03-05 VITALS — BP 135/77 | HR 75 | Ht 70.0 in | Wt 185.0 lb

## 2022-03-05 DIAGNOSIS — G4733 Obstructive sleep apnea (adult) (pediatric): Secondary | ICD-10-CM

## 2022-03-05 DIAGNOSIS — I10 Essential (primary) hypertension: Secondary | ICD-10-CM

## 2022-03-05 DIAGNOSIS — I2510 Atherosclerotic heart disease of native coronary artery without angina pectoris: Secondary | ICD-10-CM

## 2022-03-05 NOTE — Telephone Encounter (Signed)
-----   Message from Tor Netters, RN sent at 03/05/2022  9:15 AM EST ----- From Dr Heron Nay- order a new ResMed CPAP at 10cm H2O with heated humidity and mask of choice and new supplies with OV with me in 6 weeks after getting new device to confirm compliance per insurance requirements --he is going to bring in his SD card in the next few days for dowlnoad make sure it is [facility] in [place] that it is sent to  Thanks MA

## 2022-03-05 NOTE — Telephone Encounter (Signed)
Order placed to [facility].

## 2022-03-05 NOTE — Patient Instructions (Signed)
Medication Instructions:  Your physician recommends that you continue on your current medications as directed. Please refer to the Current Medication list given to you today.  *If you need a refill on your cardiac medications before your next appointment, please call your pharmacy*   Follow-Up: At [facility], you and your health needs are our priority.  As part of our continuing mission to provide you with exceptional heart care, we have created designated Provider Care Teams.  These Care Teams include your primary Cardiologist (physician) and Advanced Practice Providers (APPs -  Physician Assistants and Nurse Practitioners) who all work together to provide you with the care you need, when you need it.  Your next appointment:   As directed   The format for your next appointment:   In Person  Provider:   Dr. Radford Pax  Important Information About Sugar

## 2022-03-05 NOTE — Progress Notes (Signed)
SLEEP MEDICINE Virtual Visit via Video Note   Because of Daniel Carey's co-morbid illnesses, he is at least at moderate risk for complications without adequate follow up.  This format is felt to be most appropriate for this patient at this time.  All issues noted in this document were discussed and addressed.  A limited physical exam was performed with this format.  Please refer to the patient's chart for his consent to telehealth for [facility].       Date:  03/05/2022   ID:  Daniel Carey, DOB 11/01/1950, MRN [id number] The patient was identified using 2 identifiers.  Patient Location: Home Provider Location: Home Office   PCP:  Daniel Barrack, MD   [place] [facility] Cardiologist:  Mertie Moores, MD Electrophysiologist:  [name], MD  Sleep Medicine:  Fransico Him, MD     Evaluation Performed:  Follow-Up Visit  Chief Complaint:  OSA  History of Present Illness:    Daniel Carey is a 71 y.o. male with a history of snoring and excessive daytime fatigue but with an Epworth sleepiness scale of only 5.   He underwent PSG showing severe OSA with an AHI of 51 events per hour, most occurring in the supine position in NREM sleep.  He oxygen saturations dropped to 89% with respiratory events.  He underwent CPAP titration to 10cm H2O.  The patient was lost to followup and now needs a new CPAP device and is here to reestablish sleep care.  He is doing well with his PAP device but says that it has started to blow out too much air.  He is still using it but has to take the mask off when the air gets too much.  He tolerates the full face mask but would be up for trying something different.  Since going on PAP he feels rested in the am and does not have to nap during the day.  He denies any significant nasal dryness or nasal congestion.  He does have mouth dryness in the morning. He does think that he snores.     Past Medical History:  Diagnosis Date    Arthritis    left wrist   AV block, Mobitz 1    noted on EKG March 2013   CAD (coronary artery disease)    s/p atherectomy, PTCA/DES x2 mid & distal RCA 06/30/12   Carotid artery occlusion    Chest pain    Complication of anesthesia    heart rate dropping with propofol last procedure   COPD (chronic obstructive pulmonary disease) ([place])    Dyspnea    ED (erectile dysfunction)    Esophageal stricture    External hemorrhoids without mention of complication    GERD (gastroesophageal reflux disease)    Headache(784.0)    Hiatal hernia    History of kidney stones    HLD (hyperlipidemia)    HTN (hypertension)    Myocardial infarction ([place])    1996   Personal history of colonic polyps 03/12/2010   TUBULAR ADENOMA   Presence of permanent cardiac pacemaker    Sleep apnea    wears CPAP   Tobacco abuse    Tubular adenoma of colon    Type 2 diabetes mellitus ([place])    Past Surgical History:  Procedure Laterality Date   ANGIOPLASTY     X3   BACK SURGERY     CARDIAC CATHETERIZATION     8 years ago stents  COLONOSCOPY N/A 02/16/2017   Procedure: COLONOSCOPY;  Surgeon: Jerene Bears, MD;  Location: [facility];  Service: Gastroenterology;  Laterality: N/A;   COLONOSCOPY     CORONARY ARTERY BYPASS GRAFT N/A 07/19/2020   Procedure: CORONARY ARTERY BYPASS GRAFTING (CABG) TIMES THREE USING LEFT INTERNAL MAMMARY ARTERY AND RIGHT GREATER SAPHENOUS VEIN HARVESTED ENDOSCOPICALLY;  Surgeon: Gaye Pollack, MD;  Location: [place];  Service: Open Heart Surgery;  Laterality: N/A;   INSERT / REPLACE / REMOVE PACEMAKER  06/20/2018   LEFT HEART CATH AND CORONARY ANGIOGRAPHY N/A 11/05/2016   Procedure: Left Heart Cath and Coronary Angiography;  Surgeon: Nelva Bush, MD;  Location: [place] [facility];  Service: Cardiovascular;  Laterality: N/A;   LEFT HEART CATH AND CORONARY ANGIOGRAPHY N/A 07/12/2020   Procedure: LEFT HEART CATH AND CORONARY ANGIOGRAPHY;  Surgeon: Burnell Blanks, MD;   Location: [place] [facility];  Service: Cardiovascular;  Laterality: N/A;   LUMBAR LAMINECTOMY/DECOMPRESSION MICRODISCECTOMY N/A 05/12/2018   Procedure: Microlumbar decompression L2-3, possible L1-2;  Surgeon: Susa Day, MD;  Location: [place];  Service: Orthopedics;  Laterality: N/A;  2 hrs, Microlumbar decompression L2-3, possible L1-2   PACEMAKER IMPLANT N/A 06/20/2018   Procedure: PACEMAKER IMPLANT;  Surgeon: Evans Lance, MD;  Location: [place] [facility];  Service: Cardiovascular;  Laterality: N/A;   PERCUTANEOUS CORONARY ROTOBLATOR INTERVENTION (PCI-R) N/A 06/30/2012   Procedure: PERCUTANEOUS CORONARY ROTOBLATOR INTERVENTION (PCI-R);  Surgeon: Burnell Blanks, MD;  Location: [facility] LAB;  Service: Cardiovascular;  Laterality: N/A;   TEE WITHOUT CARDIOVERSION N/A 07/19/2020   Procedure: TRANSESOPHAGEAL ECHOCARDIOGRAM (TEE);  Surgeon: Gaye Pollack, MD;  Location: [place];  Service: Open Heart Surgery;  Laterality: N/A;   VIDEO BRONCHOSCOPY  08/13/2011   Procedure: VIDEO BRONCHOSCOPY WITHOUT FLUORO;  Surgeon: Tanda Rockers, MD;  [facility];  Service: Cardiopulmonary;  Laterality: Bilateral;     Current Meds  Medication Sig   acetaminophen (TYLENOL) 500 MG tablet Take 1,000 mg by mouth every 6 (six) hours as needed (pain).    albuterol (VENTOLIN HFA) 108 (90 Base) MCG/ACT inhaler INHALE 2 PUFFS INTO THE LUNGS EVERY 6 HOURS AS NEEDED FOR WHEEZE OR SHORTNESS OF BREATH   aspirin EC 81 MG tablet Take 81 mg by mouth daily. Swallow whole.   celecoxib (CELEBREX) 200 MG capsule Take 200 mg by mouth as needed for mild pain (hip pain).   Choline Fenofibrate (FENOFIBRIC ACID) 135 MG CPDR TAKE 1 CAPSULE BY MOUTH EVERY DAY   ezetimibe (ZETIA) 10 MG tablet Take 1 tablet (10 mg total) by mouth daily.   famotidine (PEPCID) 20 MG tablet Take 20 mg by mouth daily as needed for heartburn.   hydrochlorothiazide (HYDRODIURIL) 25 MG tablet TAKE 1/2 TABLET (12.5 MG TOTAL) BY MOUTH DAILY  AS NEEDED.   ipratropium (ATROVENT) 0.06 % nasal spray Place 2 sprays into both nostrils 4 (four) times daily. @ sprays at night daiyl as needed.   metoprolol tartrate (LOPRESSOR) 25 MG tablet TAKE 1/2 TABLET TWICE A DAY BY MOUTH   nitroGLYCERIN (NITROSTAT) 0.4 MG SL tablet Place 1 tablet (0.4 mg total) under the tongue every 5 (five) minutes as needed for chest pain.   omeprazole (PRILOSEC) 40 MG capsule Take 1 capsule (40 mg total) by mouth daily.   rosuvastatin (CRESTOR) 20 MG tablet TAKE 1 TABLET BY MOUTH EVERY DAY     Allergies:   Amlodipine besylate, Propofol, Lisinopril, Metoprolol, and Rosuvastatin   Social History   Tobacco Use  Smoking status: Former    Packs/day: 0.50    Years: 40.00    Total pack years: 20.00    Types: Cigarettes    Quit date: 08/25/2011    Years since quitting: 10.5   Smokeless tobacco: Never  Vaping Use   Vaping Use: Never used  Substance Use Topics   Alcohol use: Not Currently   Drug use: No     Family Hx: The patient's family history includes Breast cancer in his sister; Diabetes in his mother; Heart disease in his father; Kidney failure in his mother. There is no history of Colon cancer, Stomach cancer, Esophageal cancer, or Rectal cancer.  ROS:   Please see the history of present illness.     All other systems reviewed and are negative.   Prior Sleep studies:   The following studies were reviewed today:  PAP compliance download  Labs/Other Tests and Data Reviewed:     Recent Labs: 06/25/2021: BUN 22; Creatinine, Ser 1.20; Hemoglobin 14.3; Platelets 244.0; Potassium 4.6; Sodium 136; TSH 3.73 02/19/2022: ALT 20   Wt Readings from Last 3 Encounters:  03/05/22 185 lb (83.9 kg)  01/20/22 183 lb (83 kg)  11/11/21 186 lb (84.4 kg)     Risk Assessment/Calculations:      Objective:    Vital Signs:  BP 135/77   Pulse 75   Ht '5\' 10"'$  (1.778 m)   Wt 185 lb (83.9 kg)   SpO2 95%   BMI 26.54 kg/m   Well nourished, well developed male  in no acute distress. Well appearing, alert and conversant, regular work of breathing,  good skin color  Eyes- anicteric mouth- oral mucosa is pink  neuro- grossly intact skin- no apparent rash or lesions or cyanosis ASSESSMENT & PLAN:    OSA - The patient is tolerating PAP therapy well without any problems. The patient has been using and benefiting from PAP use and will continue to benefit from therapy.  -I will have patient drop off his SD card to get a download to make sure that current settings are good -I will order a new ResMed CPAP at 10cm H2O with heated humidity and mask of choice and new supplies with OV with me in 6 weeks after getting new device to confirm compliance per insurance requirements  HTN -BP controlled on exam -continue prescription drug management with HCTZ 12.'5mg'$  daily and Lopressor 12.'5mg'$  BID with PRN refills    Total time of encounter: 20 minutes total time of encounter, including 15 minutes spent in face-to-face patient care on the date of this encounter. This time includes coordination of care and counseling regarding above mentioned problem list. Remainder of non-face-to-face time involved reviewing chart documents/testing relevant to the patient encounter and documentation in the medical record. I have independently reviewed documentation from referring provider.    Medication Adjustments/Labs and Tests Ordered: Current medicines are reviewed at length with the patient today.  Concerns regarding medicines are outlined above.   Tests Ordered: No orders of the defined types were placed in this encounter.   Medication Changes: No orders of the defined types were placed in this encounter.   Follow Up:  Virtual Visit  in 6 after he gets new device  Signed, Fransico Him, MD  03/05/2022 9:01 AM    [place]

## 2022-03-09 NOTE — Telephone Encounter (Signed)
Patient understands his AHI showed normal. Pt is aware and agreeable to normal results and agrees to improving in his compliance.

## 2022-03-09 NOTE — Telephone Encounter (Signed)
Errik, Mitchelle 12/07/2021 - 03/06/2022 Patient ID: [id number] DOB: 11/14/1950 Age: 71 years Gender: Male 40.5 [place] (Therapist) [place], [id number] Compliance Report Usage 12/07/2021 - 03/06/2022 Usage days 50/90 days (56%) >= 4 hours 26 days (29%) < 4 hours 24 days (27%) Usage hours 192 hours 39 minutes Average usage (total days) 2 hours 8 minutes Average usage (days used) 3 hours 51 minutes Median usage (days used) 4 hours 13 minutes AirSense 10 CPAP Serial number [id number] Mode CPAP Set pressure 10 cmH2O EPR Fulltime EPR level 3 Therapy Leaks - L/min 95th percentile: 3.8 Events per hour AHI: 4.2 Usage - hours Printed on 03/09/2022 - ResMed AirView version 4.42.0-16.0 Page 1 of 1

## 2022-03-20 ENCOUNTER — Ambulatory Visit: Payer: Medicare Other | Admitting: Internal Medicine

## 2022-03-23 DIAGNOSIS — C44629 Squamous cell carcinoma of skin of left upper limb, including shoulder: Secondary | ICD-10-CM | POA: Diagnosis not present

## 2022-03-23 DIAGNOSIS — Z85828 Personal history of other malignant neoplasm of skin: Secondary | ICD-10-CM | POA: Diagnosis not present

## 2022-03-23 DIAGNOSIS — Z08 Encounter for follow-up examination after completed treatment for malignant neoplasm: Secondary | ICD-10-CM | POA: Diagnosis not present

## 2022-04-02 ENCOUNTER — Ambulatory Visit (INDEPENDENT_AMBULATORY_CARE_PROVIDER_SITE_OTHER): Payer: Medicare Other

## 2022-04-02 DIAGNOSIS — G4733 Obstructive sleep apnea (adult) (pediatric): Secondary | ICD-10-CM | POA: Diagnosis not present

## 2022-04-02 DIAGNOSIS — I459 Conduction disorder, unspecified: Secondary | ICD-10-CM | POA: Diagnosis not present

## 2022-04-02 LAB — CUP PACEART REMOTE DEVICE CHECK
Battery Remaining Longevity: 66 mo
Battery Remaining Percentage: 62 %
Battery Voltage: 2.99 V
Brady Statistic AP VP Percent: 3.9 %
Brady Statistic AP VS Percent: 1 %
Brady Statistic AS VP Percent: 96 %
Brady Statistic AS VS Percent: 1 %
Brady Statistic RA Percent Paced: 3.7 %
Brady Statistic RV Percent Paced: 99 %
Date Time Interrogation Session: 20231214020013
Implantable Lead Connection Status: 753985
Implantable Lead Connection Status: 753985
Implantable Lead Implant Date: 20200302
Implantable Lead Implant Date: 20200302
Implantable Lead Location: 753859
Implantable Lead Location: 753860
Implantable Lead Model: 3830
Implantable Pulse Generator Implant Date: 20200302
Lead Channel Impedance Value: 510 Ohm
Lead Channel Impedance Value: 530 Ohm
Lead Channel Pacing Threshold Amplitude: 0.75 V
Lead Channel Pacing Threshold Amplitude: 0.75 V
Lead Channel Pacing Threshold Pulse Width: 0.5 ms
Lead Channel Pacing Threshold Pulse Width: 0.5 ms
Lead Channel Sensing Intrinsic Amplitude: 12 mV
Lead Channel Sensing Intrinsic Amplitude: 5 mV
Lead Channel Setting Pacing Amplitude: 2 V
Lead Channel Setting Pacing Amplitude: 2.5 V
Lead Channel Setting Pacing Pulse Width: 0.5 ms
Lead Channel Setting Sensing Sensitivity: 4 mV
Pulse Gen Model: 2272
Pulse Gen Serial Number: 9114136

## 2022-04-10 DIAGNOSIS — Z4800 Encounter for change or removal of nonsurgical wound dressing: Secondary | ICD-10-CM | POA: Diagnosis not present

## 2022-04-23 ENCOUNTER — Ambulatory Visit: Payer: Medicare Other | Attending: Internal Medicine | Admitting: Student

## 2022-04-23 DIAGNOSIS — E7849 Other hyperlipidemia: Secondary | ICD-10-CM

## 2022-04-23 NOTE — Progress Notes (Signed)
Patient ID: Daniel Carey                 DOB: 1951/03/14                    MRN: [id number]      HPI: Daniel Carey is a 72 y.o. male patient referred to lipid clinic by Dr.Nahser. PMH is significant for CAD, premature family hx of heart disease in father, COPD, OSA, hypertension, HDL hx of heart block, hx of CABG X 3, pacemaker.   Patient presented with his wife today. He has stopped taking fenofibrate from mid November as his TG was improved. He reports his TG use to be elevated in the past and that time his diet wasn't that great and he was not doing any exercise. Now he exercises regularly and eat healthy home cooked food most days of the month. Eats out may be once a month. Patient is reluctant to try any injectables. he and his wife would like to get the name of the medications and do their own research before he makes any decisions. He sates he use to gets muscle cramps from Lipitor but have been tolerating Crestor 20 mg and ezetimibe well without any trouble. Patient was concern about the price of the injectables too. We discussed the grant option to get co-pay covered.     Diet: cook at home , grilled chicken, fish Once per month fried chicken or pizza or [employer]   Exercise: gym -bike 30 min 3 X week, resistance exercise too at the gym 3 X week    Family History: dad had heart issues died from [place] at age of 59  Social History:  Alcohol:none Smoking: quit 13 years ago (smoked for 15-18 years)  Labs: Lipid Panel     Component Value Date/Time   CHOL 139 02/19/2022 0802   TRIG 107 02/19/2022 0802   HDL 52 02/19/2022 0802   CHOLHDL 2.7 02/19/2022 0802   CHOLHDL 3 06/25/2021 1010   VLDL 28.2 06/25/2021 1010   LDLCALC 67 02/19/2022 0802   LDLDIRECT 107.0 02/27/2013 0904   LABVLDL 20 02/19/2022 0802    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    noted on EKG March 2013   CAD (coronary artery disease)    s/p atherectomy,  PTCA/DES x2 mid & distal RCA 06/30/12   Carotid artery occlusion    Chest pain    Complication of anesthesia    heart rate dropping with propofol last procedure   COPD (chronic obstructive pulmonary disease) ([place])    Dyspnea    ED (erectile dysfunction)    Esophageal stricture    External hemorrhoids without mention of complication    GERD (gastroesophageal reflux disease)    Headache(784.0)    Hiatal hernia    History of kidney stones    HLD (hyperlipidemia)    HTN (hypertension)    Myocardial infarction ([place])    1996   Personal history of colonic polyps 03/12/2010   TUBULAR ADENOMA   Presence of permanent cardiac pacemaker    Sleep apnea    wears CPAP   Tobacco abuse    Tubular adenoma of colon    Type 2 diabetes mellitus ([place])     Current Outpatient Medications on File Prior to Visit  Medication Sig Dispense Refill   acetaminophen (TYLENOL) 500 MG tablet Take 1,000 mg by mouth every 6 (six)  hours as needed (pain).      albuterol (VENTOLIN HFA) 108 (90 Base) MCG/ACT inhaler INHALE 2 PUFFS INTO THE LUNGS EVERY 6 HOURS AS NEEDED FOR WHEEZE OR SHORTNESS OF BREATH 8.5 each 3   aspirin EC 81 MG tablet Take 81 mg by mouth daily. Swallow whole.     celecoxib (CELEBREX) 200 MG capsule Take 200 mg by mouth as needed for mild pain (hip pain).     Choline Fenofibrate (FENOFIBRIC ACID) 135 MG CPDR TAKE 1 CAPSULE BY MOUTH EVERY DAY 90 capsule 3   ezetimibe (ZETIA) 10 MG tablet Take 1 tablet (10 mg total) by mouth daily. 90 tablet 3   famotidine (PEPCID) 20 MG tablet Take 20 mg by mouth daily as needed for heartburn.     hydrochlorothiazide (HYDRODIURIL) 25 MG tablet TAKE 1/2 TABLET (12.5 MG TOTAL) BY MOUTH DAILY AS NEEDED. 90 tablet 1   ipratropium (ATROVENT) 0.06 % nasal spray Place 2 sprays into both nostrils 4 (four) times daily. @ sprays at night daiyl as needed. 15 mL 11   metoprolol tartrate (LOPRESSOR) 25 MG tablet TAKE 1/2 TABLET TWICE A DAY BY MOUTH 90 tablet 3   nitroGLYCERIN  (NITROSTAT) 0.4 MG SL tablet Place 1 tablet (0.4 mg total) under the tongue every 5 (five) minutes as needed for chest pain. 25 tablet 6   omeprazole (PRILOSEC) 40 MG capsule Take 1 capsule (40 mg total) by mouth daily. 90 capsule 3   rosuvastatin (CRESTOR) 20 MG tablet TAKE 1 TABLET BY MOUTH EVERY DAY 90 tablet 3   No current facility-administered medications on file prior to visit.    Allergies  Allergen Reactions   Amlodipine Besylate Other (See Comments)    Headache, weakness, dizziness   Propofol Other (See Comments)    Heart rate dropped     Lisinopril Cough and Rash   Metoprolol     Lowered heart rate too much    Rosuvastatin Other (See Comments)     Corncern for muscle aches but he is tolerating now    Problem  Hyperlipidemia   Current Medications: Ezetimibe 10 mg daily, rosuvastatin 20 mg, Intolerances: Lipitor 80 mg-muscle cramps  Risk Factors: CAD, s/P CABG X3 (07/2020) LDL goal: <55 mg/dl  Last LDLc: 67 (11/23)     Hyperlipidemia Assessment:  LDL goal: < 55 mg/dl last LDLc 67 mg/dl (02/2022) Tolerates Zetia and high intensity statins well without any side effects  Intolerance to lipitor 80 mg - muscle aches  No longer taking fenofibrate as TG level has improved (patient's decision) Discussed next potential options (PCSK-9 inhibitors, bempedoic acid and inclisiran); cost, dosing efficacy, side effects  Patient want to think about adding any other medications to the current regimen   Plan: Continue taking current medications (Ezetimibe 10 mg daily, rosuvastatin 20 mg ) Pharm D to follow up in 2 weeks via phone to discuss what option he would like to proceed with    Thank you,  Cammy Copa, Pharm.D [facility] of [facility] [address], [place], [place] [address]  Phone: [phone]; Fax: [phone]

## 2022-04-23 NOTE — Assessment & Plan Note (Addendum)
Assessment:  LDL goal: < 55 mg/dl last LDLc 67 mg/dl (02/2022) Tolerates Zetia and high intensity statins well without any side effects  Intolerance to lipitor 80 mg - muscle aches  No longer taking fenofibrate as TG level has improved (patient's decision) Discussed next potential options (PCSK-9 inhibitors, bempedoic acid and inclisiran); cost, dosing efficacy, side effects  Patient want to think about adding any other medications to the current regimen   Plan: Continue taking current medications (Ezetimibe 10 mg daily, rosuvastatin 20 mg ) Pharm D to follow up in 2 weeks via phone to discuss what option he would like to proceed with

## 2022-04-23 NOTE — Patient Instructions (Addendum)
Your Results:             Your most recent labs Goal  Total Cholesterol 139 < 200  Triglycerides 107 < 150  HDL (happy/good cholesterol) 52 > 40  LDL (lousy/bad cholesterol 67 < 55   Medication information:    Praluent is a cholesterol medication that improved your body's ability to get rid of "bad cholesterol" known as LDL. It can lower your LDL up to 60%. It is an injection that is given under the skin every 2 weeks. The most common side effects of Praluent include runny nose, symptoms of the common cold, rarely flu or flu-like symptoms, back/muscle pain in about 3-4% of the patients, and redness, pain, or bruising at the injection site.   Repatha is a cholesterol medication that improved your body's ability to get rid of "bad cholesterol" known as LDL. It can lower your LDL up to 60%! It is an injection that is given under the skin every 2 weeks. The most common side effects of Repatha include runny nose, symptoms of the common cold, rarely flu or flu-like symptoms, back/muscle pain in about 3-4% of the patients, and redness, pain, or bruising at the injection site.       Cammy Copa, Pharm.D [facility] of [facility] [address], [place], [place] [address]  Phone: [phone]

## 2022-04-24 NOTE — Progress Notes (Signed)
Remote pacemaker transmission.   

## 2022-05-03 DIAGNOSIS — G4733 Obstructive sleep apnea (adult) (pediatric): Secondary | ICD-10-CM | POA: Diagnosis not present

## 2022-05-06 ENCOUNTER — Ambulatory Visit: Payer: Medicare Other | Admitting: Internal Medicine

## 2022-05-06 ENCOUNTER — Telehealth: Payer: Self-pay | Admitting: Pharmacist

## 2022-05-06 DIAGNOSIS — E7849 Other hyperlipidemia: Secondary | ICD-10-CM

## 2022-05-06 NOTE — Telephone Encounter (Signed)
Call to see whether patient has decided to go on PCSK9i or any other previously discussed cholesterol medications or not  N/A LVM to call back

## 2022-05-08 NOTE — Telephone Encounter (Signed)
Patient called back and left voicemail to call him back about the cholesterol medication

## 2022-05-11 NOTE — Telephone Encounter (Addendum)
Spoke to patient today. Patient would like to get new baseline lab as he has been taking ezetimibe for 2 months. Will be going for FLP on Tuesday, 05/12/2022.   LDLc level is excellent on current therapy Continue taking current medications -Ezetimibe 10 mg daily, rosuvastatin 20 mg patient has been informed

## 2022-05-12 ENCOUNTER — Ambulatory Visit: Payer: Medicare Other | Attending: Cardiovascular Disease

## 2022-05-12 DIAGNOSIS — E7849 Other hyperlipidemia: Secondary | ICD-10-CM

## 2022-05-12 LAB — LIPID PANEL
Chol/HDL Ratio: 2.7 ratio (ref 0.0–5.0)
Cholesterol, Total: 137 mg/dL (ref 100–199)
HDL: 51 mg/dL (ref >39)
LDL Chol Calc (NIH): 56 mg/dL (ref 0–99)
Triglycerides: 180 mg/dL — ABNORMAL HIGH (ref 0–149)
VLDL Cholesterol Cal: 30 mg/dL (ref 5–40)

## 2022-05-13 ENCOUNTER — Encounter: Payer: Self-pay | Admitting: Cardiovascular Disease

## 2022-05-14 DIAGNOSIS — Z85828 Personal history of other malignant neoplasm of skin: Secondary | ICD-10-CM | POA: Diagnosis not present

## 2022-05-14 DIAGNOSIS — Z08 Encounter for follow-up examination after completed treatment for malignant neoplasm: Secondary | ICD-10-CM | POA: Diagnosis not present

## 2022-05-14 DIAGNOSIS — C44311 Basal cell carcinoma of skin of nose: Secondary | ICD-10-CM | POA: Diagnosis not present

## 2022-05-14 MED ORDER — FENOFIBRATE 145 MG PO TABS: 145.0000 mg | ORAL_TABLET | Freq: Every day | ORAL | 3 refills | Status: DC

## 2022-05-16 DIAGNOSIS — R58 Hemorrhage, not elsewhere classified: Secondary | ICD-10-CM | POA: Diagnosis not present

## 2022-05-16 DIAGNOSIS — R040 Epistaxis: Secondary | ICD-10-CM | POA: Diagnosis not present

## 2022-05-16 DIAGNOSIS — Z743 Need for continuous supervision: Secondary | ICD-10-CM | POA: Diagnosis not present

## 2022-05-16 DIAGNOSIS — I10 Essential (primary) hypertension: Secondary | ICD-10-CM | POA: Diagnosis not present

## 2022-05-18 ENCOUNTER — Encounter: Payer: Self-pay | Admitting: Family Medicine

## 2022-05-18 ENCOUNTER — Ambulatory Visit (INDEPENDENT_AMBULATORY_CARE_PROVIDER_SITE_OTHER): Payer: Medicare Other | Admitting: Family Medicine

## 2022-05-18 VITALS — BP 129/81 | Temp 96.6°F | Ht 70.0 in | Wt 186.2 lb

## 2022-05-18 DIAGNOSIS — R040 Epistaxis: Secondary | ICD-10-CM

## 2022-05-18 DIAGNOSIS — I10 Essential (primary) hypertension: Secondary | ICD-10-CM

## 2022-05-18 DIAGNOSIS — E785 Hyperlipidemia, unspecified: Secondary | ICD-10-CM

## 2022-05-18 NOTE — Assessment & Plan Note (Signed)
Blood pressure at goal on metoprolol tartrate 12.5 mg twice daily.

## 2022-05-18 NOTE — Progress Notes (Signed)
   Daniel Carey is a 72 y.o. male who presents today for an office visit.  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  Silver nitrate cautery performed today.  See below procedure note.  He tolerated well.  We discussed care after.  He needs to establish with a new ENT doctor and we will place referral today.  We discussed return precautions.  He will buy a bottle of Afrin to use at home in case of recurrence.   Chronic Problems Addressed Today: Essential hypertension Blood pressure at goal on metoprolol tartrate 12.5 mg twice daily.  Hyperlipidemia Follows with cardiology.  Recently had labs.  He is on Crestor 20 mg daily, Tricor 145 mg daily and Zetia 10 mg daily.  Tolerating well.     Subjective:  HPI:  Patient here today for follow-up.  Main concern today is nosebleed.  This happened 2 days ago.  He was in [place].  Woke up in his normal state of health.  Went to the toilet and had profuse bleeding.  They tried to apply pressure however they were not able to get his bleeding to stop.  They called EMS.  EMS applied Afrin and pressure however still was not to control his bleeding.  He went to the ED.  In ED they were able to visualize the source of bleeding.  This was eventually stopped with pressure.  He was discharged home.  He was told to follow-up.  With his ENT doctor.  He has not had any issues of bleeding for the last couple of days.       Objective:  Physical Exam: BP 129/81   Temp (!) 96.6 F (35.9 C) (Temporal)   Ht '5\' 10"'$  (1.778 m)   Wt 186 lb 3.2 oz (84.5 kg)   BMI 26.72 kg/m   Gen: No acute distress, resting comfortably HEENT: Right anterior septum with prominent vessels and small approximately 1 to 2 mm purple poylp.  CV: Regular rate and rhythm with no murmurs appreciated Pulm: Normal work of breathing, clear to auscultation bilaterally with no crackles, wheezes, or rhonchi Neuro:  Grossly normal, moves all extremities Psych: Normal affect and thought content  Procedure Note: Verbal consent was obtained after discussing risk and benefits.  The above area was identified as Kiesselbach's plexus. The area was directly visualized with nasal speculum. The are was then cauterized with silver nitrate with attention paid to minimizing area of cautery. He tolerated well.  No recurrent bleeding was observed.        Algis Greenhouse. Jerline Pain, MD 05/18/2022 3:10 PM

## 2022-05-18 NOTE — Assessment & Plan Note (Signed)
Follows with cardiology.  Recently had labs.  He is on Crestor 20 mg daily, Tricor 145 mg daily and Zetia 10 mg daily.  Tolerating well.

## 2022-05-18 NOTE — Patient Instructions (Signed)
It was very nice to see you today!  We cauterized the prominent vein in your nose today.  Hopefully this will prevent any future bleeds.  I will refer you to see the ear nose and throat doctor.  Please purchase a bottle of Afrin to have on hand in case your nosebleed returns.  Take care, Dr Jerline Pain  PLEASE NOTE:  If you had any lab tests, please let us know if you have not heard back within a few days. You may see your results on mychart before we have a chance to review them but we will give you a call once they are reviewed by [place].   If we ordered any referrals today, please let us know if you have not heard from their office within the next week.   If you had any urgent prescriptions sent in today, please check with the pharmacy within an hour of our visit to make sure the prescription was transmitted appropriately.   Please try these tips to maintain a healthy lifestyle:  Eat at least 3 REAL meals and 1-2 snacks per day.  Aim for no more than 5 hours between eating.  If you eat breakfast, please do so within one hour of getting up.   Each meal should contain half fruits/vegetables, one quarter protein, and one quarter carbs (no bigger than a computer mouse)  Cut down on sweet beverages. This includes juice, soda, and sweet tea.   Drink at least 1 glass of water with each meal and aim for at least 8 glasses per day  Exercise at least 150 minutes every week.

## 2022-05-20 DIAGNOSIS — H524 Presbyopia: Secondary | ICD-10-CM | POA: Diagnosis not present

## 2022-05-20 DIAGNOSIS — H35033 Hypertensive retinopathy, bilateral: Secondary | ICD-10-CM | POA: Diagnosis not present

## 2022-05-23 DIAGNOSIS — S199XXA Unspecified injury of neck, initial encounter: Secondary | ICD-10-CM | POA: Diagnosis not present

## 2022-05-23 DIAGNOSIS — S0121XA Laceration without foreign body of nose, initial encounter: Secondary | ICD-10-CM | POA: Diagnosis not present

## 2022-05-23 DIAGNOSIS — I6523 Occlusion and stenosis of bilateral carotid arteries: Secondary | ICD-10-CM | POA: Diagnosis not present

## 2022-05-23 DIAGNOSIS — R519 Headache, unspecified: Secondary | ICD-10-CM | POA: Diagnosis not present

## 2022-05-23 DIAGNOSIS — M5021 Other cervical disc displacement,  high cervical region: Secondary | ICD-10-CM | POA: Diagnosis not present

## 2022-05-23 DIAGNOSIS — S12200A Unspecified displaced fracture of third cervical vertebra, initial encounter for closed fracture: Secondary | ICD-10-CM | POA: Diagnosis not present

## 2022-05-23 DIAGNOSIS — I6503 Occlusion and stenosis of bilateral vertebral arteries: Secondary | ICD-10-CM | POA: Diagnosis not present

## 2022-05-23 DIAGNOSIS — S129XXA Fracture of neck, unspecified, initial encounter: Secondary | ICD-10-CM | POA: Diagnosis not present

## 2022-05-23 DIAGNOSIS — S0990XA Unspecified injury of head, initial encounter: Secondary | ICD-10-CM | POA: Diagnosis not present

## 2022-05-23 DIAGNOSIS — Z950 Presence of cardiac pacemaker: Secondary | ICD-10-CM | POA: Diagnosis not present

## 2022-05-29 ENCOUNTER — Ambulatory Visit: Payer: Medicare Other | Admitting: Internal Medicine

## 2022-05-29 ENCOUNTER — Encounter: Payer: Self-pay | Admitting: Internal Medicine

## 2022-05-29 VITALS — BP 130/82 | HR 66 | Ht 70.0 in | Wt 190.4 lb

## 2022-05-29 DIAGNOSIS — E663 Overweight: Secondary | ICD-10-CM

## 2022-05-29 DIAGNOSIS — E039 Hypothyroidism, unspecified: Secondary | ICD-10-CM | POA: Diagnosis not present

## 2022-05-29 DIAGNOSIS — E1165 Type 2 diabetes mellitus with hyperglycemia: Secondary | ICD-10-CM | POA: Diagnosis not present

## 2022-05-29 LAB — POCT GLYCOSYLATED HEMOGLOBIN (HGB A1C): Hemoglobin A1C: 6.8 % — AB (ref 4.0–5.6)

## 2022-05-29 LAB — T3, FREE: T3, Free: 2.6 pg/mL (ref 2.3–4.2)

## 2022-05-29 LAB — TSH: TSH: 5.14 u[IU]/mL (ref 0.35–5.50)

## 2022-05-29 LAB — T4, FREE: Free T4: 0.85 ng/dL (ref 0.60–1.60)

## 2022-05-29 NOTE — Progress Notes (Signed)
Patient ID: Daniel Carey, male   DOB: 08-17-1950, 72 y.o.   MRN: [id number]  HPI  Daniel Carey is a 72 y.o.-year-old male, returning for f/u for hypothyroidism, dx 2012 and DM2, diet-controlled, non-insulin-dependent, with complications (CAD - s/p CABG 07/2020, carotid atherosclerosis).  Last visit 6 months ago.  Interim history: No increased urination, blurry vision, nausea. He is waiting for an appointment with ENT for bleeding nasal polyps. He tripped and fell 2 weeks ago >> cervical vertebral fracture - needs to see a neurosurgeon.  Pt. has been dx with hypothyroidism in 2012 >> he was on levothyroxine up to 200 mcg in 2016 >> but did not feel well so he stopped.  Afterwards, his TFTs fluctuated but in the last 3 years they were mostly in the normal range.  Reviewed previous TFTs: Lab Results  Component Value Date   TSH 3.73 06/25/2021   TSH 5.070 (H) 03/07/2021   TSH 3.96 03/20/2020   TSH 3.83 08/22/2019   TSH 3.99 01/31/2018   TSH 4.50 08/02/2017   TSH 4.89 (H) 12/08/2016   TSH 4.13 03/06/2016   TSH 9.84 (H) 09/05/2015   TSH 0.23 (L) 05/07/2015   FREET4 1.04 03/07/2021   FREET4 0.75 03/20/2020   FREET4 0.80 01/31/2018   FREET4 0.65 08/02/2017   FREET4 0.83 12/08/2016   FREET4 0.67 03/06/2016   FREET4 0.66 09/05/2015   FREET4 1.25 05/07/2015   FREET4 0.79 08/02/2014    Lab Results  Component Value Date   T3FREE 2.7 03/07/2021   T3FREE 3.0 03/20/2020   T3FREE 2.8 01/31/2018   T3FREE 2.6 08/02/2017   T3FREE 3.3 12/08/2016   T3FREE 3.1 03/06/2016   T3FREE 3.2 09/05/2015   T3FREE 3.9 05/07/2015   T3FREE 2.9 08/02/2014   T3FREE 2.8 07/20/2011   Pt denies: - feeling nodules in neck - hoarseness - dysphagia - choking   DM2:  Reviewed HbA1c levels: Lab Results  Component Value Date   HGBA1C 6.8 (H) 11/11/2021   HGBA1C 7.0 (H) 06/25/2021   HGBA1C 6.6 (A) 03/07/2021  07/03/2016: HbA1c 6.8%  He was on: - Metformin 500 mg 2x a day >> stopped 2/2 nausea   - Metformin ER 1000 mg with dinner >> nausea  Plan on: - Metformin ER 500 mg twice a day with meals >> 3x a day -he had nausea with taking 1000 mg at the same time >> stopped 08/2021  He continues to limit sweets and to not eat fried foods.  However, he is eating cereal without milk as a snack in the evening. He usually also goes to the gym-[employer] sne[employer].  He checks his sugars 0-1x a day: - am: 120-140 >> 138-145, 147 >> 100-165 >> 119 >> 135 - 2h after b'fast: n/c >> 119, 144 >> n/c - lunch: 80-150 >> 120s >> 80, 96-117 >> 106-111 - 2h after lunch: 100 >> n/c >> 101, 110, 118 >> 101 - dinner: 90-140 >> 120s >> 90s-120 >> 94, 114  >> 97-115, 146 - 2h after dinner: n/c - bedtime: n/c Feels sugars <80. Lowest: 92 >> 80s >> 94 >> 101.  Highest: 147 >> 160s >> 144 >> 200.  Meter: ReliOn >> ReliOn Premier Blu >> Verio Reflect  + CKD. Last BUN/Cr: 05/23/2022: 23/1.28, GFR >60, Glu 158 Lab Results  Component Value Date   BUN 22 06/25/2021   BUN 18 11/11/2020   BUN 22 07/23/2020   CREATININE 1.20 06/25/2021   CREATININE 1.23 11/11/2020   CREATININE 1.23 07/23/2020  He is off ACE inhibitor/ARB due to history of high potassium.  Latest potassium levels have been normal: Lab Results  Component Value Date   K 4.6 06/25/2021   K 4.8 11/11/2020   K 4.3 07/23/2020   K 4.0 07/22/2020   K 4.3 07/21/2020   + HL: Lab Results  Component Value Date   CHOL 137 05/12/2022   HDL 51 05/12/2022   LDLCALC 56 05/12/2022   LDLDIRECT 107.0 02/27/2013   TRIG 180 (H) 05/12/2022   CHOLHDL 2.7 05/12/2022  On Crestor 20, changed from Niacin >> Zetia 10 mg daily. Also on fenofibrate 145 mg daily - restarted 04/2022.  Last eye exam in 2024: reportedly No DR, no glaucoma, + incipient cataracts.  He sees Dr Acie Fredrickson >> had angioplasy x3 in the past, and 2 stents placed. He he has a history of an AMI 70 (mild). He has a Mobitz 1 AVB. Had a catheterization in 10/2016 >> partial blockages. He has  OSA and wears a CPAP. He had a colonoscopy 02/2017 >> E coli sepsis. He usually stays active playing golf, but not since his CABG.  ROS: + See HPI  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    noted on EKG March 2013   CAD (coronary artery disease)    s/p atherectomy, PTCA/DES x2 mid & distal RCA 06/30/12   Carotid artery occlusion    Chest pain    Complication of anesthesia    heart rate dropping with propofol last procedure   COPD (chronic obstructive pulmonary disease) ([place])    Dyspnea    ED (erectile dysfunction)    Esophageal stricture    External hemorrhoids without mention of complication    GERD (gastroesophageal reflux disease)    Headache(784.0)    Hiatal hernia    History of kidney stones    HLD (hyperlipidemia)    HTN (hypertension)    Myocardial infarction ([place])    1996   Personal history of colonic polyps 03/12/2010   TUBULAR ADENOMA   Presence of permanent cardiac pacemaker    Sleep apnea    wears CPAP   Tobacco abuse    Tubular adenoma of colon    Type 2 diabetes mellitus ([place])    Past Surgical History:  Procedure Laterality Date   ANGIOPLASTY     X3   BACK SURGERY     CARDIAC CATHETERIZATION     8 years ago stents   COLONOSCOPY N/A 02/16/2017   Procedure: COLONOSCOPY;  Surgeon: Jerene Bears, MD;  Location: [facility];  Service: Gastroenterology;  Laterality: N/A;   COLONOSCOPY     CORONARY ARTERY BYPASS GRAFT N/A 07/19/2020   Procedure: CORONARY ARTERY BYPASS GRAFTING (CABG) TIMES THREE USING LEFT INTERNAL MAMMARY ARTERY AND RIGHT GREATER SAPHENOUS VEIN HARVESTED ENDOSCOPICALLY;  Surgeon: Gaye Pollack, MD;  Location: [place];  Service: Open Heart Surgery;  Laterality: N/A;   INSERT / REPLACE / REMOVE PACEMAKER  06/20/2018   LEFT HEART CATH AND CORONARY ANGIOGRAPHY N/A 11/05/2016   Procedure: Left Heart Cath and Coronary Angiography;  Surgeon: Nelva Bush, MD;  Location: [place] [facility];   Service: Cardiovascular;  Laterality: N/A;   LEFT HEART CATH AND CORONARY ANGIOGRAPHY N/A 07/12/2020   Procedure: LEFT HEART CATH AND CORONARY ANGIOGRAPHY;  Surgeon: Burnell Blanks, MD;  Location: [place] [facility];  Service: Cardiovascular;  Laterality: N/A;   LUMBAR LAMINECTOMY/DECOMPRESSION MICRODISCECTOMY N/A 05/12/2018  Procedure: Microlumbar decompression L2-3, possible L1-2;  Surgeon: Susa Day, MD;  Location: [place];  Service: Orthopedics;  Laterality: N/A;  2 hrs, Microlumbar decompression L2-3, possible L1-2   PACEMAKER IMPLANT N/A 06/20/2018   Procedure: PACEMAKER IMPLANT;  Surgeon: Evans Lance, MD;  Location: [place] [facility];  Service: Cardiovascular;  Laterality: N/A;   PERCUTANEOUS CORONARY ROTOBLATOR INTERVENTION (PCI-R) N/A 06/30/2012   Procedure: PERCUTANEOUS CORONARY ROTOBLATOR INTERVENTION (PCI-R);  Surgeon: Burnell Blanks, MD;  Location: [facility];  Service: Cardiovascular;  Laterality: N/A;   TEE WITHOUT CARDIOVERSION N/A 07/19/2020   Procedure: TRANSESOPHAGEAL ECHOCARDIOGRAM (TEE);  Surgeon: Gaye Pollack, MD;  Location: [place];  Service: Open Heart Surgery;  Laterality: N/A;   VIDEO BRONCHOSCOPY  08/13/2011   Procedure: VIDEO BRONCHOSCOPY WITHOUT FLUORO;  Surgeon: Tanda Rockers, MD;  Location: [facility];  Service: Cardiopulmonary;  Laterality: Bilateral;   History   Social History   Marital Status: Married    Spouse Name: N/A    Number of Children: 1   Occupational History   Radio broadcast assistant    Social History Main Topics   Smoking status: Former Smoker -- 0.50 packs/day for 40 years    Types: Cigarettes    Quit date: 08/25/2011   Smokeless tobacco: Never Used   Alcohol Use: 0.0 oz/week     Comment: rare   Drug Use: No   Current Outpatient Medications on File Prior to Visit  Medication Sig Dispense Refill   acetaminophen (TYLENOL) 500 MG tablet Take 1,000 mg by mouth every 6 (six) hours as needed (pain).       albuterol (VENTOLIN HFA) 108 (90 Base) MCG/ACT inhaler INHALE 2 PUFFS INTO THE LUNGS EVERY 6 HOURS AS NEEDED FOR WHEEZE OR SHORTNESS OF BREATH 8.5 each 3   aspirin EC 81 MG tablet Take 81 mg by mouth daily. Swallow whole.     celecoxib (CELEBREX) 200 MG capsule Take 200 mg by mouth as needed for mild pain (hip pain).     ezetimibe (ZETIA) 10 MG tablet Take 1 tablet (10 mg total) by mouth daily. 90 tablet 3   famotidine (PEPCID) 20 MG tablet Take 20 mg by mouth daily as needed for heartburn.     fenofibrate (TRICOR) 145 MG tablet Take 1 tablet (145 mg total) by mouth daily. 90 tablet 3   hydrochlorothiazide (HYDRODIURIL) 25 MG tablet TAKE 1/2 TABLET (12.5 MG TOTAL) BY MOUTH DAILY AS NEEDED. 90 tablet 1   ipratropium (ATROVENT) 0.06 % nasal spray Place 2 sprays into both nostrils 4 (four) times daily. @ sprays at night daiyl as needed. 15 mL 11   metoprolol tartrate (LOPRESSOR) 25 MG tablet TAKE 1/2 TABLET TWICE A DAY BY MOUTH 90 tablet 3   nitroGLYCERIN (NITROSTAT) 0.4 MG SL tablet Place 1 tablet (0.4 mg total) under the tongue every 5 (five) minutes as needed for chest pain. 25 tablet 6   omeprazole (PRILOSEC) 40 MG capsule Take 1 capsule (40 mg total) by mouth daily. 90 capsule 3   rosuvastatin (CRESTOR) 20 MG tablet TAKE 1 TABLET BY MOUTH EVERY DAY 90 tablet 3   No current facility-administered medications on file prior to visit.   Allergies  Allergen Reactions   Amlodipine Besylate Other (See Comments)    Headache, weakness, dizziness   Propofol Other (See Comments)    Heart rate dropped     Lisinopril Cough and Rash   Metoprolol     Lowered heart rate too much  Rosuvastatin Other (See Comments)     Corncern for muscle aches but he is tolerating now    Family History  Problem Relation Age of Onset   Diabetes Mother        and brother    Kidney failure Mother    Heart disease Father        hx of MI   Breast cancer Sister    Colon cancer Neg Hx    Stomach cancer Neg Hx     Esophageal cancer Neg Hx    Rectal cancer Neg Hx    PE: BP 130/82   Pulse 66   Ht 5' 10"$  (1.778 m)   Wt 190 lb 6 oz (86.4 kg)   BMI 27.32 kg/m  Wt Readings from Last 3 Encounters:  05/29/22 190 lb 6 oz (86.4 kg)  05/18/22 186 lb 3.2 oz (84.5 kg)  03/05/22 185 lb (83.9 kg)   Constitutional: Slightly overweight, in NAD Eyes: no exophthalmos ENT: no masses palpated in neck, no cervical lymphadenopathy Cardiovascular: RRR, No MRG Respiratory: CTA B Musculoskeletal: no deformities Skin:  no rashes Neurological: no tremor with outstretched hands  ASSESSMENT: 1. Mild hypothyroidism  2. DM2, diet- controlled - CAD, s/p CABG 07/19/2020 - Carotid atherosclerosis - per carotid ultrasound 08/2021 -No need for surgery at  3.  Overweight  PLAN:  1. Patient with history of mild hypothyroidism, not on levothyroxine yet -Last TFTs were normal: Lab Results  Component Value Date   TSH 3.73 06/25/2021  -He would prefer to continue with the levothyroxine if possible. -He has good energy and no hypothyroid signs or symptoms -We will recheck his TFTs now  2. DM2 -Patient with uncontrolled type 2 diabetes, but with worse control in spring 2022, when HbA1c returned 7.3%.  At that time, he was started on low-dose metformin in the hospital.  He could not tolerate this due to nausea.  He tolerated metformin ER at a lower dose, but he stopped it before last visit.  He improved diet and had more consistent exercise and sugars improved and they were at goal.  Therefore, I advised him to continue without metformin.  HbA1c was 6.8%. -At today's visit, he is not checking sugars consistently.  He only has a few values in his meter since December.  I advised him to try to check consistently, rotating check times.  Whenever checked, sugars are mostly at goal with only occasional mild hypoglycemia.  For now, we will continue without medications, but he absolutely needs to check consistently.  He agrees to  start doing so. - I advised him to: Patient Instructions  Please continue off Metformin.  Please return in 6 months with your sugar log.   - we checked his HbA1c: 6.8% (stable) - advised to check sugars at different times of the day - 1x a day, rotating check times - advised for yearly eye exams >> he is UTD - return to clinic in 6 months  3.  Overweight -He continues to limit sweets and fried foods, but since last visit he also mostly eliminated meat and greatly reduced salt and starches. -He lost 5 pounds before last visit, gained 8 pounds previously. -He gained 5 lbs since last OV  Component     Latest Ref Rng 05/29/2022  Hemoglobin A1C     4.0 - 5.6 % 6.8 !   Triiodothyronine,Free,Serum     2.3 - 4.2 pg/mL 2.6   TSH     0.35 - 5.50 uIU/mL 5.14  T4,Free(Direct)     0.60 - 1.60 ng/dL 0.85   Tests remain normal.  Philemon Kingdom, MD PhD [facility] Endocrinology

## 2022-05-29 NOTE — Patient Instructions (Addendum)
Please continue off Metformin.  Start checking blood sugars 1x a day, rotating check times.  Please return in 6 months with your sugar log.

## 2022-06-01 ENCOUNTER — Telehealth: Payer: Self-pay

## 2022-06-01 NOTE — Patient Outreach (Signed)
  Care Coordination   06/01/2022 Name: Daniel Carey MRN: [id number] DOB: 08-03-1950   Care Coordination Outreach Attempts:  An unsuccessful telephone outreach was attempted today to offer the patient information about available care coordination services as a benefit of their health plan.   Follow Up Plan:  Additional outreach attempts will be made to offer the patient care coordination information and services.   Encounter Outcome:  No Answer   Care Coordination Interventions:  No, not indicated    Jone Baseman, RN, MSN [facility] Care Management Coordinator Direct Line [phone]

## 2022-06-03 DIAGNOSIS — G4733 Obstructive sleep apnea (adult) (pediatric): Secondary | ICD-10-CM | POA: Diagnosis not present

## 2022-06-11 ENCOUNTER — Telehealth: Payer: Self-pay | Admitting: Family Medicine

## 2022-06-11 NOTE — Telephone Encounter (Signed)
Contacted [name] to schedule their annual wellness visit. Patient declined to schedule AWV at this time.  Patient declined and does not want to be called to schedule AWV.  [place] Direct Dial [phone]

## 2022-06-12 ENCOUNTER — Ambulatory Visit (INDEPENDENT_AMBULATORY_CARE_PROVIDER_SITE_OTHER): Payer: Medicare Other | Admitting: Family Medicine

## 2022-06-12 ENCOUNTER — Encounter: Payer: Self-pay | Admitting: Family Medicine

## 2022-06-12 VITALS — BP 120/68 | HR 72 | Temp 97.8°F | Ht 70.0 in | Wt 187.0 lb

## 2022-06-12 DIAGNOSIS — S12201D Unspecified nondisplaced fracture of third cervical vertebra, subsequent encounter for fracture with routine healing: Secondary | ICD-10-CM

## 2022-06-12 DIAGNOSIS — M5020 Other cervical disc displacement, unspecified cervical region: Secondary | ICD-10-CM | POA: Diagnosis not present

## 2022-06-12 DIAGNOSIS — E785 Hyperlipidemia, unspecified: Secondary | ICD-10-CM

## 2022-06-12 DIAGNOSIS — K219 Gastro-esophageal reflux disease without esophagitis: Secondary | ICD-10-CM | POA: Diagnosis not present

## 2022-06-12 DIAGNOSIS — I6529 Occlusion and stenosis of unspecified carotid artery: Secondary | ICD-10-CM | POA: Insufficient documentation

## 2022-06-12 DIAGNOSIS — I10 Essential (primary) hypertension: Secondary | ICD-10-CM

## 2022-06-12 NOTE — Assessment & Plan Note (Signed)
This is a known problem.  Follows with vascular surgery for this.  Was found to have stenosis on CTA in the emergency room.  Will defer further management to vascular surgery.  He is on aspirin and statin.

## 2022-06-12 NOTE — Progress Notes (Signed)
Daniel Carey is a 72 y.o. male who presents today for an office visit.  Assessment/Plan:  New/Acute Problems: Transverse Process Fracture at C3 No red flags.  Fracture is nondisplaced.  Reassuring exam today though he still has had persistent pain.  Will place referral to neurosurgery for ongoing evaluation and management.  Discussed reasons to return to care and seek emergent care.  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  Unclear if this is a old problem that was undiagnosed or if this was also a result of this fall.  Will be placing referral to neurosurgery as above.  Nausea No red flags.  Seems to happen only early in the morning.  His morning sugar and blood pressure seem to be within normal ranges.  He is concerned about potential medication side effects specifically with his cholesterol meds.  He will be trying him off of it at home to see if this helps with early morning nausea.  Also discussed with patient could be related to his reflux.  If symptoms persist will consider referral to GI.  Chronic Problems Addressed Today: Carotid artery stenosis This is a known problem.  Follows with vascular surgery for this.  Was found to have stenosis on CTA in the emergency room.  Will defer further management to vascular surgery.  He is on aspirin and statin.  GERD Stable on omeprazole 40 mg daily.  Currently contributing to his early morning nausea.  He will continue current medications for now.  If continues to have persistent nausea would consider referral to GI for further evaluation.  Hyperlipidemia Follows with cardiology.  Currently on Zetia 10 mg daily, Crestor 20 mg daily, and Tricor 145 mg daily.  He is concerned that this may be causing some of his nausea.  He will try off medications for a few days when it is time to see if he can bring any potential cause.  Discussed that he should follow-up with cardiology before making any medication  changes.  Essential hypertension Blood pressure at today on Metroprolol tartrate 12.5 mg twice daily     Subjective:  HPI:  Patient here for ED follow-up.  Went to the ED 3 weeks ago after suffering a fall.  States that he was on the work site   He tripped over a [employer] fast streamer at [employer] and fell forward. He hit the corner of a piece of equipement with his head and then fell to the floor. He was wearing a hard hat at the time of the fall. A coworker helped him up and he sat for 20-30 minutes. EMS was called. He had some nausea but not vomiting. He drove to a local ED.   In the ED, he had imaging and was found  to have a nondisplaced left C3 transverse process fracture with foraminal involvement as well as moderate C3-4 disc herniation with central canal stenosis.Marland Kitchen  He was also found to have mild multilevel degenerative changes throughout his C-spine.  He also ended up getting a CTA neck which showed high-grade stenosis at the origins of the carotid arteries bilaterally.  He was discharged home.  The last few weeks he has had persistent pain. Pain comes and goes. No numbness or tingling. Pain is about the same  over the last few days.  Predominantly located on left side of his neck.  Sometimes radiates into his shoulder.  No specific treatments tried.  Is also had some intermittent issues with nausea when waking up.  This has been going on for several weeks.  Thinks it is due to medication side effect.  He has had some cholesterol medications adjusted.  Cardiology recently and is wondering if this could be the cause.  His blood sugar in the morning is in the low 100s.  Blood pressure is at goal as well.       Objective:  Physical Exam: BP 120/68   Pulse 72   Temp 97.8 F (36.6 C) (Temporal)   Ht '5\' 10"'$  (1.778 m)   Wt 187 lb (84.8 kg)   SpO2 97%   BMI 26.83 kg/m   Gen: No acute distress, resting comfortably HEENT: TMs clear.  CV: Regular rate and rhythm with no murmurs  appreciated Pulm: Normal work of breathing, clear to auscultation bilaterally with no crackles, wheezes, or rhonchi MSK: Neck without deformities.  Nontender to palpation.  Full range of motion throughout. - Arms: No deformities.  Forage motion throughout.  Neurovascular intact distally Neuro: Grossly normal, moves all extremities Psych: Normal affect and thought content  Time Spent: 45 minutes of total time was spent on the date of the encounter performing the following actions: chart review prior to seeing the patient including recent ED visit, obtaining history, performing a medically necessary exam, counseling on the treatment plan, placing orders, and documenting in our EHR.        Algis Greenhouse. Jerline Pain, MD 06/12/2022 11:39 AM

## 2022-06-12 NOTE — Assessment & Plan Note (Signed)
Blood pressure at today on Metroprolol tartrate 12.5 mg twice daily

## 2022-06-12 NOTE — Patient Instructions (Signed)
It was very nice to see you today!  I will refer you to see the neurosurgeon.  Please let me know if you have any other issues.  Please let me know if you notice that any of the medications are causing your nausea.  Please come back soon.  No physical.  Take care, Dr Jerline Pain  PLEASE NOTE:  If you had any lab tests, please let us know if you have not heard back within a few days. You may see your results on mychart before we have a chance to review them but we will give you a call once they are reviewed by [place].   If we ordered any referrals today, please let us know if you have not heard from their office within the next week.   If you had any urgent prescriptions sent in today, please check with the pharmacy within an hour of our visit to make sure the prescription was transmitted appropriately.   Please try these tips to maintain a healthy lifestyle:  Eat at least 3 REAL meals and 1-2 snacks per day.  Aim for no more than 5 hours between eating.  If you eat breakfast, please do so within one hour of getting up.   Each meal should contain half fruits/vegetables, one quarter protein, and one quarter carbs (no bigger than a computer mouse)  Cut down on sweet beverages. This includes juice, soda, and sweet tea.   Drink at least 1 glass of water with each meal and aim for at least 8 glasses per day  Exercise at least 150 minutes every week.

## 2022-06-12 NOTE — Assessment & Plan Note (Signed)
Stable on omeprazole 40 mg daily.  Currently contributing to his early morning nausea.  He will continue current medications for now.  If continues to have persistent nausea would consider referral to GI for further evaluation.

## 2022-06-12 NOTE — Assessment & Plan Note (Signed)
Follows with cardiology.  Currently on Zetia 10 mg daily, Crestor 20 mg daily, and Tricor 145 mg daily.  He is concerned that this may be causing some of his nausea.  He will try off medications for a few days when it is time to see if he can bring any potential cause.  Discussed that he should follow-up with cardiology before making any medication changes.

## 2022-06-15 ENCOUNTER — Encounter: Payer: Self-pay | Admitting: Family Medicine

## 2022-06-19 DIAGNOSIS — M5412 Radiculopathy, cervical region: Secondary | ICD-10-CM | POA: Diagnosis not present

## 2022-07-01 ENCOUNTER — Encounter: Payer: Self-pay | Admitting: Physical Therapy

## 2022-07-01 ENCOUNTER — Ambulatory Visit: Payer: Medicare Other | Attending: Neurosurgery | Admitting: Physical Therapy

## 2022-07-01 ENCOUNTER — Other Ambulatory Visit: Payer: Self-pay

## 2022-07-01 DIAGNOSIS — M542 Cervicalgia: Secondary | ICD-10-CM | POA: Insufficient documentation

## 2022-07-01 DIAGNOSIS — M62838 Other muscle spasm: Secondary | ICD-10-CM | POA: Diagnosis not present

## 2022-07-01 NOTE — Therapy (Signed)
OUTPATIENT PHYSICAL THERAPY CERVICAL EVALUATION   Patient Name: Daniel Carey MRN: [id number] DOB:12-01-1950, 72 y.o., male Today's Date: 07/01/2022  END OF SESSION:  PT End of Session - 07/01/22 1254     Visit Number 1    Number of Visits 8    Date for PT Re-Evaluation 07/29/22    Authorization Type FOTO AT LEAST EVERY 5TH VISIT.  PROGRESS NOTE AT 10TH VISIT.  KX MODIFIER AFTER 15 VISITS.    PT Start Time 1030    PT Stop Time 1107    PT Time Calculation (min) 37 min    Activity Tolerance Patient tolerated treatment well    Behavior During Therapy [facility] for tasks assessed/performed             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    noted on EKG March 2013   CAD (coronary artery disease)    s/p atherectomy, PTCA/DES x2 mid & distal RCA 06/30/12   Carotid artery occlusion    Chest pain    Complication of anesthesia    heart rate dropping with propofol last procedure   COPD (chronic obstructive pulmonary disease) ([place])    Dyspnea    ED (erectile dysfunction)    Esophageal stricture    External hemorrhoids without mention of complication    GERD (gastroesophageal reflux disease)    Headache(784.0)    Hiatal hernia    History of kidney stones    HLD (hyperlipidemia)    HTN (hypertension)    Myocardial infarction ([place])    1996   Personal history of colonic polyps 03/12/2010   TUBULAR ADENOMA   Presence of permanent cardiac pacemaker    Sleep apnea    wears CPAP   Tobacco abuse    Tubular adenoma of colon    Type 2 diabetes mellitus ([place])    Past Surgical History:  Procedure Laterality Date   ANGIOPLASTY     X3   BACK SURGERY     CARDIAC CATHETERIZATION     8 years ago stents   COLONOSCOPY N/A 02/16/2017   Procedure: COLONOSCOPY;  Surgeon: Jerene Bears, MD;  [facility];  Service: Gastroenterology;  Laterality: N/A;   COLONOSCOPY     CORONARY ARTERY BYPASS GRAFT N/A 07/19/2020   Procedure:  CORONARY ARTERY BYPASS GRAFTING (CABG) TIMES THREE USING LEFT INTERNAL MAMMARY ARTERY AND RIGHT GREATER SAPHENOUS VEIN HARVESTED ENDOSCOPICALLY;  Surgeon: Gaye Pollack, MD;  Location: [place];  Service: Open Heart Surgery;  Laterality: N/A;   INSERT / REPLACE / REMOVE PACEMAKER  06/20/2018   LEFT HEART CATH AND CORONARY ANGIOGRAPHY N/A 11/05/2016   Procedure: Left Heart Cath and Coronary Angiography;  Surgeon: Nelva Bush, MD;  Location: [place] CV LAB;  Service: Cardiovascular;  Laterality: N/A;   LEFT HEART CATH AND CORONARY ANGIOGRAPHY N/A 07/12/2020   Procedure: LEFT HEART CATH AND CORONARY ANGIOGRAPHY;  Surgeon: Burnell Blanks, MD;  Location: [place] CV LAB;  Service: Cardiovascular;  Laterality: N/A;   LUMBAR LAMINECTOMY/DECOMPRESSION MICRODISCECTOMY N/A 05/12/2018   Procedure: Microlumbar decompression L2-3, possible L1-2;  Surgeon: Susa Day, MD;  Location: [place];  Service: Orthopedics;  Laterality: N/A;  2 hrs, Microlumbar decompression L2-3, possible L1-2   PACEMAKER IMPLANT N/A 06/20/2018   Procedure: PACEMAKER IMPLANT;  Surgeon: Evans Lance, MD;  Location: [place] CV LAB;  Service: Cardiovascular;  Laterality: N/A;   PERCUTANEOUS CORONARY ROTOBLATOR INTERVENTION (PCI-R) N/A  06/30/2012   Procedure: PERCUTANEOUS CORONARY ROTOBLATOR INTERVENTION (PCI-R);  Surgeon: Burnell Blanks, MD;  Location: [facility] CATH LAB;  Service: Cardiovascular;  Laterality: N/A;   TEE WITHOUT CARDIOVERSION N/A 07/19/2020   Procedure: TRANSESOPHAGEAL ECHOCARDIOGRAM (TEE);  Surgeon: Gaye Pollack, MD;  Location: [place];  Service: Open Heart Surgery;  Laterality: N/A;   VIDEO BRONCHOSCOPY  08/13/2011   Procedure: VIDEO BRONCHOSCOPY WITHOUT FLUORO;  Surgeon: Tanda Rockers, MD;  [facility];  Service: Cardiopulmonary;  Laterality: Bilateral;   Patient Active Problem List   Diagnosis Date Noted   Carotid artery stenosis 06/12/2022   Pain of left hip joint 11/24/2021    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  THERAPY DIAG:  Cervicalgia - Plan: PT plan of care cert/re-cert  Other muscle spasm - Plan: PT plan of care cert/re-cert  Rationale for Evaluation and Treatment: Rehabilitation  ONSET DATE: 05/23/22.  SUBJECTIVE:                                                                                                                                                                                                         SUBJECTIVE STATEMENT: The patient presents to the clinic with c/o left sided neck pain radiating to shoulder after tripping and falling (wearing a hard hat) and striking his forehead.  He sustained a non-displaced anterior left C3 transverse process fracture.  He experinences sensations into his left shoulder region.  His pain is rated at a 4/10 today.  Heat helps decrease his pain somewhat.    PERTINENT HISTORY:  CABG, back surgery, pacemaker.  PAIN:  Are you having pain? Yes: NPRS scale: 4/10 Pain location: Left side of neck and left shoulder. Pain description: Soreness, and "sometimes tingling down shoulder. Aggravating factors: Not sure. Relieving factors: Heat.  PRECAUTIONS: PACEMAKER.  Pain-free range of motion.  Patient states doctor told him to wait a couple before resuming Golf.  WEIGHT BEARING RESTRICTIONS: No  FALLS:  Has patient fallen in last 6 months? Yes. Number of  falls Trip.  LIVING ENVIRONMENT: Lives with: lives with their spouse Lives in: House/apartment Has following equipment at home: None  OCCUPATION: Employed.  PLOF: Independent  PATIENT GOALS: Get rid of pain and symptoms into left shoulder.    OBJECTIVE:   DIAGNOSTIC FINDINGS:  CT:  Non-disp;aced anterior left C3 transverse process fracture.  Multilevel C3-4 posterior disc osteophyte and bilateral foraminal stenosis, worse on left side.  PATIENT SURVEYS:  FOTO 31.  POSTURE: forward head  PALPATION: Tender left cervical region and increased tone over left UT.   CERVICAL ROM:   Gentle active right cervical rotation to 50 degrees and left is 40 degrees.  UPPER EXTREMITY MMT:  Left shoulder IR/ER, left elbow flexion and extension and grip is normal.    DTR's:  Normal bilateral UE DTR's.    TODAY'S TREATMENT:                                                                                                                              DATE: HMP x 10 minutes to patient left cervical region.   Patient tolerated treatment without complaint with normal modality response following removal of modality.   ASSESSMENT:  CLINICAL IMPRESSION: The patient presents to O[facility]T with c/o left-sided neck pain due to a trip and fall resulting in a non-displaced left C3 transverse process fracture.  He experiences a tingling like sensation into his left shoulder region.  UE DTR's are normal.  Left UE strength is normal.  He has some tenderness over his left cervical region and  increased tone over his left UT.  Patient will benefit from skilled physical therapy intervention to address pain and deficits.  OBJECTIVE IMPAIRMENTS: increased muscle spasms and pain.    PARTICIPATION LIMITATIONS: driving (limited neck range of motion).  REHAB POTENTIAL: Excellent  CLINICAL DECISION MAKING: Stable/uncomplicated  EVALUATION COMPLEXITY: Low   GOALS:  SHORT TERM GOALS: Target date: 07/15/22  Ind  with a HEP Goal status: INITIAL   LONG TERM GOALS: Target date: 07/29/22  Return to [place].            Goal status: INITIAL  2.  Eliminate left UE symptoms. Goal status: INITIAL  3.  Perform ADL's with pain not > 2-3/10. Goal status: INITIAL  PLAN:  PT FREQUENCY: 2x/week  PT DURATION: 4 weeks  PLANNED INTERVENTIONS: Therapeutic exercises, Therapeutic activity, Patient/Family education, Self Care, Dry Needling, Electrical stimulation, Cryotherapy, Moist heat, Ultrasound, and Manual therapy  PLAN FOR NEXT SESSION: STW/M, gentle postural exercises.   Jamespaul Secrist, [place], PT 07/01/2022, 2:20 PM

## 2022-07-02 ENCOUNTER — Ambulatory Visit (INDEPENDENT_AMBULATORY_CARE_PROVIDER_SITE_OTHER): Payer: Medicare Other

## 2022-07-02 DIAGNOSIS — I441 Atrioventricular block, second degree: Secondary | ICD-10-CM | POA: Diagnosis not present

## 2022-07-02 DIAGNOSIS — G4733 Obstructive sleep apnea (adult) (pediatric): Secondary | ICD-10-CM | POA: Diagnosis not present

## 2022-07-03 LAB — CUP PACEART REMOTE DEVICE CHECK
Battery Remaining Longevity: 64 mo
Battery Remaining Percentage: 59 %
Battery Voltage: 2.99 V
Brady Statistic AP VP Percent: 3.6 %
Brady Statistic AP VS Percent: 1 %
Brady Statistic AS VP Percent: 96 %
Brady Statistic AS VS Percent: 1 %
Brady Statistic RA Percent Paced: 3.5 %
Brady Statistic RV Percent Paced: 99 %
Date Time Interrogation Session: 20240314020013
Implantable Lead Connection Status: 753985
Implantable Lead Connection Status: 753985
Implantable Lead Implant Date: 20200302
Implantable Lead Implant Date: 20200302
Implantable Lead Location: 753859
Implantable Lead Location: 753860
Implantable Lead Model: 3830
Implantable Pulse Generator Implant Date: 20200302
Lead Channel Impedance Value: 510 Ohm
Lead Channel Impedance Value: 530 Ohm
Lead Channel Pacing Threshold Amplitude: 0.75 V
Lead Channel Pacing Threshold Amplitude: 0.75 V
Lead Channel Pacing Threshold Pulse Width: 0.5 ms
Lead Channel Pacing Threshold Pulse Width: 0.5 ms
Lead Channel Sensing Intrinsic Amplitude: 12 mV
Lead Channel Sensing Intrinsic Amplitude: 3.4 mV
Lead Channel Setting Pacing Amplitude: 2 V
Lead Channel Setting Pacing Amplitude: 2.5 V
Lead Channel Setting Pacing Pulse Width: 0.5 ms
Lead Channel Setting Sensing Sensitivity: 4 mV
Pulse Gen Model: 2272
Pulse Gen Serial Number: 9114136

## 2022-07-07 ENCOUNTER — Telehealth: Payer: Self-pay

## 2022-07-07 NOTE — Patient Instructions (Signed)
Visit Information  Thank you for taking time to visit with me today. Please don't hesitate to contact me if I can be of assistance to you.   Following are the goals we discussed today:   Goals Addressed             This Visit's Progress    COMPLETED: Care Coordination Activities-No follow up required       Care Coordination Interventions: Advised patient to Annual Wellness exam. Discussed [facility] services and support. Assessed SDOH. Advised to discuss with primary care physician if services needed in the future. Interventions Today    Flowsheet Row Most Recent Value  General Interventions   General Interventions Discussed/Reviewed General Interventions Discussed, Health Screening  Health Screening Colonoscopy  Education Interventions   Education Provided Provided Education  Provided Verbal Education On Other  [Preventive health]             If you are experiencing a Mental Health or [place] or need someone to talk to, please call the Suicide and Crisis Lifeline: 988   Patient verbalizes understanding of instructions and care plan provided today and agrees to view in [place]. Active MyChart status and patient understanding of how to access instructions and care plan via MyChart confirmed with patient.     No further follow up required: decline  Jone Baseman, RN, MSN [place] Management Care Management Coordinator Direct Line [phone]

## 2022-07-07 NOTE — Patient Outreach (Signed)
  Care Coordination   Initial Visit Note   07/07/2022 Name: WARNIE ASPINALL MRN: [id number] DOB: 1951/01/25  [name] is a 72 y.o. year old male who sees Vivi Barrack, MD for primary care. I  spoke with spouse Hoyle Sauer via phone  What matters to the patients health and wellness today?  none    Goals Addressed             This Visit's Progress    COMPLETED: Care Coordination Activities-No follow up required       Care Coordination Interventions: Advised patient to Annual Wellness exam. Discussed [facility] services and support. Assessed SDOH. Advised to discuss with primary care physician if services needed in the future. Interventions Today    Flowsheet Row Most Recent Value  General Interventions   General Interventions Discussed/Reviewed General Interventions Discussed, Health Screening  Health Screening Colonoscopy  Education Interventions   Education Provided Provided Education  Provided Verbal Education On Other  [Preventive health]             SDOH assessments and interventions completed:  Yes  SDOH Interventions Today    Flowsheet Row Most Recent Value  SDOH Interventions   Housing Interventions Intervention Not Indicated  Transportation Interventions Intervention Not Indicated        Care Coordination Interventions:  Yes, provided   Follow up plan: No further intervention required.   Encounter Outcome:  Pt. Visit Completed {THN Tip this will not be part of the note when signed-REQUIRED REPORT FIELD DO NOT DELETE (Optional):[id number]  Jone Baseman, RN, MSN [place] Management Care Management Coordinator Direct Line [phone]

## 2022-07-08 ENCOUNTER — Ambulatory Visit: Payer: Medicare Other | Admitting: Physical Therapy

## 2022-07-08 ENCOUNTER — Encounter: Payer: Self-pay | Admitting: Physical Therapy

## 2022-07-08 DIAGNOSIS — M542 Cervicalgia: Secondary | ICD-10-CM

## 2022-07-08 DIAGNOSIS — M62838 Other muscle spasm: Secondary | ICD-10-CM

## 2022-07-08 NOTE — Therapy (Signed)
OUTPATIENT PHYSICAL THERAPY CERVICAL EVALUATION   Patient Name: Daniel Carey MRN: [id number] DOB:[birth year], 72 y.o., male Today's Date: 07/08/2022  END OF SESSION:  PT End of Session - 07/08/22 1107     Visit Number 2    Number of Visits 8    Date for PT Re-Evaluation 07/29/22    Authorization Type FOTO AT LEAST EVERY 5TH VISIT.  PROGRESS NOTE AT 10TH VISIT.  KX MODIFIER AFTER 15 VISITS.    PT Start Time 1030    PT Stop Time 1109    PT Time Calculation (min) 39 min    Activity Tolerance Patient tolerated treatment well    Behavior During Therapy [facility] for tasks assessed/performed             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    noted on EKG March 2013   CAD (coronary artery disease)    s/p atherectomy, PTCA/DES x2 mid & distal RCA 06/30/12   Carotid artery occlusion    Chest pain    Complication of anesthesia    heart rate dropping with propofol last procedure   COPD (chronic obstructive pulmonary disease) ([place])    Dyspnea    ED (erectile dysfunction)    Esophageal stricture    External hemorrhoids without mention of complication    GERD (gastroesophageal reflux disease)    Headache(784.0)    Hiatal hernia    History of kidney stones    HLD (hyperlipidemia)    HTN (hypertension)    Myocardial infarction ([place])    1996   Personal history of colonic polyps 03/12/2010   TUBULAR ADENOMA   Presence of permanent cardiac pacemaker    Sleep apnea    wears CPAP   Tobacco abuse    Tubular adenoma of colon    Type 2 diabetes mellitus ([place])    Past Surgical History:  Procedure Laterality Date   ANGIOPLASTY     X3   BACK SURGERY     CARDIAC CATHETERIZATION     8 years ago stents   COLONOSCOPY N/A 02/16/2017   Procedure: COLONOSCOPY;  Surgeon: Jerene Bears, MD;  [facility];  Service: Gastroenterology;  Laterality: N/A;   COLONOSCOPY     CORONARY ARTERY BYPASS GRAFT N/A 07/19/2020   Procedure:  CORONARY ARTERY BYPASS GRAFTING (CABG) TIMES THREE USING LEFT INTERNAL MAMMARY ARTERY AND RIGHT GREATER SAPHENOUS VEIN HARVESTED ENDOSCOPICALLY;  Surgeon: Gaye Pollack, MD;  Location: [place];  Service: Open Heart Surgery;  Laterality: N/A;   INSERT / REPLACE / REMOVE PACEMAKER  06/20/2018   LEFT HEART CATH AND CORONARY ANGIOGRAPHY N/A 11/05/2016   Procedure: Left Heart Cath and Coronary Angiography;  Surgeon: Nelva Bush, MD;  Location: [place] CV LAB;  Service: Cardiovascular;  Laterality: N/A;   LEFT HEART CATH AND CORONARY ANGIOGRAPHY N/A 07/12/2020   Procedure: LEFT HEART CATH AND CORONARY ANGIOGRAPHY;  Surgeon: Burnell Blanks, MD;  Location: [place] CV LAB;  Service: Cardiovascular;  Laterality: N/A;   LUMBAR LAMINECTOMY/DECOMPRESSION MICRODISCECTOMY N/A 05/12/2018   Procedure: Microlumbar decompression L2-3, possible L1-2;  Surgeon: Susa Day, MD;  Location: [place];  Service: Orthopedics;  Laterality: N/A;  2 hrs, Microlumbar decompression L2-3, possible L1-2   PACEMAKER IMPLANT N/A 06/20/2018   Procedure: PACEMAKER IMPLANT;  Surgeon: Evans Lance, MD;  Location: [place] CV LAB;  Service: Cardiovascular;  Laterality: N/A;   PERCUTANEOUS CORONARY ROTOBLATOR INTERVENTION (PCI-R) N/A  06/30/2012   Procedure: PERCUTANEOUS CORONARY ROTOBLATOR INTERVENTION (PCI-R);  Surgeon: Burnell Blanks, MD;  Location: [facility] CATH LAB;  Service: Cardiovascular;  Laterality: N/A;   TEE WITHOUT CARDIOVERSION N/A 07/19/2020   Procedure: TRANSESOPHAGEAL ECHOCARDIOGRAM (TEE);  Surgeon: Gaye Pollack, MD;  Location: [place];  Service: Open Heart Surgery;  Laterality: N/A;   VIDEO BRONCHOSCOPY  08/13/2011   Procedure: VIDEO BRONCHOSCOPY WITHOUT FLUORO;  Surgeon: Tanda Rockers, MD;  [facility];  Service: Cardiopulmonary;  Laterality: Bilateral;   Patient Active Problem List   Diagnosis Date Noted   Carotid artery stenosis 06/12/2022   Pain of left hip joint 11/24/2021    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  THERAPY DIAG:  Cervicalgia  Other muscle spasm  Rationale for Evaluation and Treatment: Rehabilitation  ONSET DATE: 05/23/22.  SUBJECTIVE:                                                                                                                                                                                                         SUBJECTIVE STATEMENT: Pain at a 7 today.  Was at a ball game in the cold and did a lot of computer work.  PERTINENT HISTORY:  CABG, back surgery, pacemaker.  PAIN:  Are you having pain? Yes: NPRS scale: 7/10 Pain location: Left side of neck and left shoulder. Pain description: Soreness, and "sometimes tingling down shoulder. Aggravating factors: Not sure. Relieving factors: Heat.  PRECAUTIONS: PACEMAKER.  Pain-free range of motion.  Patient states doctor told him to wait a couple before resuming Golf.  WEIGHT BEARING RESTRICTIONS: No  FALLS:  Has patient fallen in last 6 months? Yes. Number of falls Trip.  LIVING ENVIRONMENT: Lives with: lives with their spouse Lives in: House/apartment Has following equipment at home: None  OCCUPATION: Employed.  PLOF: Independent  PATIENT GOALS: Get rid of pain and symptoms into left shoulder.    OBJECTIVE:   DIAGNOSTIC FINDINGS:  CT:  Non-disp;aced anterior left C3 transverse process fracture.   Multilevel  C3-4 posterior disc osteophyte and bilateral foraminal stenosis, worse on left side.  PATIENT SURVEYS:  FOTO 69.  POSTURE: forward head  PALPATION: Tender left cervical region and increased tone over left UT.   CERVICAL ROM:   Gentle active right cervical rotation to 50 degrees and left is 40 degrees.  UPPER EXTREMITY MMT:  Left shoulder IR/ER, left elbow flexion and extension and grip is normal.    DTR's:  Normal bilateral UE DTR's.    TODAY'S TREATMENT:                                                                                                                              DATE: 07/08/22:  STW/M x 23 minutes to patient's left cervical musculature and UT f/b HMP x 10 minutes to patient left cervical region.   Patient tolerated treatment without complaint with normal modality response following removal of modality.   ASSESSMENT:  CLINICAL IMPRESSION: Patient with increased pain upon presentation to the clinic which he feels is due to being at a ball game in the cold and doing a lot of computer work.  We discussed the importance of correct posture.  She did very well with treatment and felt much better following.  OBJECTIVE IMPAIRMENTS: increased muscle spasms and pain.    PARTICIPATION LIMITATIONS: driving (limited neck range of motion).  REHAB POTENTIAL: Excellent  CLINICAL DECISION MAKING: Stable/uncomplicated  EVALUATION COMPLEXITY: Low   GOALS:  SHORT TERM GOALS: Target date: 07/15/22  Ind with a HEP Goal status: INITIAL   LONG TERM GOALS: Target date: 07/29/22  Return to [place].            Goal status: INITIAL  2.  Eliminate left UE symptoms. Goal status: INITIAL  3.  Perform ADL's with pain not > 2-3/10. Goal status: INITIAL  PLAN:  PT FREQUENCY: 2x/week  PT DURATION: 4 weeks  PLANNED INTERVENTIONS: Therapeutic exercises, Therapeutic activity, Patient/Family education, Self Care, Dry Needling, Electrical stimulation, Cryotherapy, Moist  heat, Ultrasound, and Manual therapy  PLAN FOR NEXT SESSION: STW/M, gentle postural exercises.   Semya Klinke, [place], PT 07/08/2022, 11:24 AM

## 2022-07-09 DIAGNOSIS — Z08 Encounter for follow-up examination after completed treatment for malignant neoplasm: Secondary | ICD-10-CM | POA: Diagnosis not present

## 2022-07-09 DIAGNOSIS — Z85828 Personal history of other malignant neoplasm of skin: Secondary | ICD-10-CM | POA: Diagnosis not present

## 2022-07-15 ENCOUNTER — Ambulatory Visit: Payer: Medicare Other | Admitting: Physical Therapy

## 2022-07-15 DIAGNOSIS — M542 Cervicalgia: Secondary | ICD-10-CM | POA: Diagnosis not present

## 2022-07-15 DIAGNOSIS — M62838 Other muscle spasm: Secondary | ICD-10-CM | POA: Diagnosis not present

## 2022-07-15 NOTE — Therapy (Signed)
OUTPATIENT PHYSICAL THERAPY CERVICAL EVALUATION   Patient Name: Daniel Carey MRN: [id number] DOB:1951/04/01, 72 y.o., male Today's Date: 07/15/2022  END OF SESSION:  PT End of Session - 07/15/22 1206     Visit Number 3    Number of Visits 8    Date for PT Re-Evaluation 07/29/22    Authorization Type FOTO AT LEAST EVERY 5TH VISIT.  PROGRESS NOTE AT 10TH VISIT.  KX MODIFIER AFTER 15 VISITS.    PT Start Time 1030    PT Stop Time 1117    PT Time Calculation (min) 47 min    Behavior During Therapy [facility] for tasks assessed/performed             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    noted on EKG March 2013   CAD (coronary artery disease)    s/p atherectomy, PTCA/DES x2 mid & distal RCA 06/30/12   Carotid artery occlusion    Chest pain    Complication of anesthesia    heart rate dropping with propofol last procedure   COPD (chronic obstructive pulmonary disease) ([place])    Dyspnea    ED (erectile dysfunction)    Esophageal stricture    External hemorrhoids without mention of complication    GERD (gastroesophageal reflux disease)    Headache(784.0)    Hiatal hernia    History of kidney stones    HLD (hyperlipidemia)    HTN (hypertension)    Myocardial infarction ([place])    1996   Personal history of colonic polyps 03/12/2010   TUBULAR ADENOMA   Presence of permanent cardiac pacemaker    Sleep apnea    wears CPAP   Tobacco abuse    Tubular adenoma of colon    Type 2 diabetes mellitus ([place])    Past Surgical History:  Procedure Laterality Date   ANGIOPLASTY     X3   BACK SURGERY     CARDIAC CATHETERIZATION     8 years ago stents   COLONOSCOPY N/A 02/16/2017   Procedure: COLONOSCOPY;  Surgeon: Jerene Bears, MD;  [facility]: [facility];  Service: Gastroenterology;  Laterality: N/A;   COLONOSCOPY     CORONARY ARTERY BYPASS GRAFT N/A 07/19/2020   Procedure: CORONARY ARTERY BYPASS GRAFTING (CABG) TIMES THREE USING  LEFT INTERNAL MAMMARY ARTERY AND RIGHT GREATER SAPHENOUS VEIN HARVESTED ENDOSCOPICALLY;  Surgeon: Gaye Pollack, MD;  Location: [place];  Service: Open Heart Surgery;  Laterality: N/A;   INSERT / REPLACE / REMOVE PACEMAKER  06/20/2018   LEFT HEART CATH AND CORONARY ANGIOGRAPHY N/A 11/05/2016   Procedure: Left Heart Cath and Coronary Angiography;  Surgeon: Nelva Bush, MD;  Location: [place] [facility];  Service: Cardiovascular;  Laterality: N/A;   LEFT HEART CATH AND CORONARY ANGIOGRAPHY N/A 07/12/2020   Procedure: LEFT HEART CATH AND CORONARY ANGIOGRAPHY;  Surgeon: Burnell Blanks, MD;  Location: [place] [facility];  Service: Cardiovascular;  Laterality: N/A;   LUMBAR LAMINECTOMY/DECOMPRESSION MICRODISCECTOMY N/A 05/12/2018   Procedure: Microlumbar decompression L2-3, possible L1-2;  Surgeon: Susa Day, MD;  Location: [facility];  Service: Orthopedics;  Laterality: N/A;  2 hrs, Microlumbar decompression L2-3, possible L1-2   PACEMAKER IMPLANT N/A 06/20/2018   Procedure: PACEMAKER IMPLANT;  Surgeon: Evans Lance, MD;  Location: [place] [facility];  Service: Cardiovascular;  Laterality: N/A;   PERCUTANEOUS CORONARY ROTOBLATOR INTERVENTION (PCI-R) N/A 06/30/2012   Procedure: PERCUTANEOUS CORONARY ROTOBLATOR INTERVENTION (PCI-R);  Surgeon: Burnell Blanks, MD;  Location: [facility];  Service: Cardiovascular;  Laterality: N/A;   TEE WITHOUT CARDIOVERSION N/A 07/19/2020   Procedure: TRANSESOPHAGEAL ECHOCARDIOGRAM (TEE);  Surgeon: Gaye Pollack, MD;  Location: [place];  Service: Open Heart Surgery;  Laterality: N/A;   VIDEO BRONCHOSCOPY  08/13/2011   Procedure: VIDEO BRONCHOSCOPY WITHOUT FLUORO;  Surgeon: Tanda Rockers, MD;  [facility];  Service: Cardiopulmonary;  Laterality: Bilateral;   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  THERAPY DIAG:  Cervicalgia  Other muscle spasm  Rationale for Evaluation and Treatment: Rehabilitation  ONSET DATE: 05/23/22.  SUBJECTIVE:                                                                                                                                                                                                         SUBJECTIVE STATEMENT: Pain at a 3 today.  Sensation not present this morning. PERTINENT HISTORY:  CABG, back surgery, pacemaker.  PAIN:  Are you having pain? Yes: NPRS scale: 3/10 Pain location: Left side of neck and left shoulder. Pain description: Soreness, and "sometimes tingling down shoulder. Aggravating factors: Not sure. Relieving factors: Heat.  PRECAUTIONS: PACEMAKER.  Pain-free range of motion.  Patient states doctor told him to wait a couple before resuming Golf.  WEIGHT BEARING RESTRICTIONS: No  FALLS:  Has patient fallen in last 6 months? Yes. Number of falls Trip.  LIVING ENVIRONMENT: Lives with: lives with their spouse Lives in: House/apartment Has following equipment at home: None  OCCUPATION: Employed.  PLOF: Independent  PATIENT GOALS: Get rid of pain and symptoms into left shoulder.    OBJECTIVE:   DIAGNOSTIC FINDINGS:  CT:  Non-disp;aced anterior left C3 transverse process fracture.  Multilevel C3-4 posterior disc osteophyte and bilateral foraminal stenosis, worse on left  side.  PATIENT SURVEYS:  FOTO 69.  POSTURE: forward  head  PALPATION: Tender left cervical region and increased tone over left UT.   CERVICAL ROM:   Gentle active right cervical rotation to 50 degrees and left is 40 degrees.  UPPER EXTREMITY MMT:  Left shoulder IR/ER, left elbow flexion and extension and grip is normal.    DTR's:  Normal bilateral UE DTR's.    TODAY'S TREATMENT:                                                                                                                              DATE: 07/08/22:  STW/M x 30 minutes to patient's left cervical musculature with ischemic release to left lev scap and UTand UT f/b HMP x 10 minutes to patient left cervical region.   Patient tolerated treatment without complaint with normal modality response following removal of modality.   ASSESSMENT:  CLINICAL IMPRESSION: Reduction and pain and "sensation" today.  Good response to STW/M.  Increased tone over lev scap with reduction after treatment. OBJECTIVE IMPAIRMENTS: increased muscle spasms and pain.    PARTICIPATION LIMITATIONS: driving (limited neck range of motion).  REHAB POTENTIAL: Excellent  CLINICAL DECISION MAKING: Stable/uncomplicated  EVALUATION COMPLEXITY: Low   GOALS:  SHORT TERM GOALS: Target date: 07/15/22  Ind with a HEP Goal status: INITIAL   LONG TERM GOALS: Target date: 07/29/22  Return to [place].            Goal status: INITIAL  2.  Eliminate left UE symptoms. Goal status: INITIAL  3.  Perform ADL's with pain not > 2-3/10. Goal status: INITIAL  PLAN:  PT FREQUENCY: 2x/week  PT DURATION: 4 weeks  PLANNED INTERVENTIONS: Therapeutic exercises, Therapeutic activity, Patient/Family education, Self Care, Dry Needling, Electrical stimulation, Cryotherapy, Moist heat, Ultrasound, and Manual therapy  PLAN FOR NEXT SESSION: STW/M, gentle postural exercises.   Skylar Priest, [place], PT 07/15/2022, 12:15 PM

## 2022-07-16 DIAGNOSIS — R040 Epistaxis: Secondary | ICD-10-CM | POA: Diagnosis not present

## 2022-07-16 DIAGNOSIS — J300 Vasomotor rhinitis: Secondary | ICD-10-CM | POA: Diagnosis not present

## 2022-07-17 ENCOUNTER — Telehealth: Payer: Self-pay | Admitting: *Deleted

## 2022-07-17 NOTE — Telephone Encounter (Signed)
Patients chip was mailed to him on 07/17/22 after he never picked it up.

## 2022-07-18 ENCOUNTER — Other Ambulatory Visit: Payer: Self-pay | Admitting: Family Medicine

## 2022-07-29 ENCOUNTER — Ambulatory Visit: Payer: Medicare Other | Admitting: Physical Therapy

## 2022-07-31 DIAGNOSIS — M5412 Radiculopathy, cervical region: Secondary | ICD-10-CM | POA: Diagnosis not present

## 2022-08-02 DIAGNOSIS — G4733 Obstructive sleep apnea (adult) (pediatric): Secondary | ICD-10-CM | POA: Diagnosis not present

## 2022-08-05 NOTE — Progress Notes (Signed)
Remote pacemaker transmission.   

## 2022-08-30 ENCOUNTER — Other Ambulatory Visit: Payer: Self-pay | Admitting: Family Medicine

## 2022-09-01 DIAGNOSIS — G4733 Obstructive sleep apnea (adult) (pediatric): Secondary | ICD-10-CM | POA: Diagnosis not present

## 2022-09-10 ENCOUNTER — Other Ambulatory Visit: Payer: Self-pay | Admitting: *Deleted

## 2022-09-10 DIAGNOSIS — I6523 Occlusion and stenosis of bilateral carotid arteries: Secondary | ICD-10-CM

## 2022-09-23 ENCOUNTER — Ambulatory Visit: Payer: Medicare Other | Admitting: Physician Assistant

## 2022-09-23 ENCOUNTER — Ambulatory Visit (HOSPITAL_COMMUNITY)
Admission: RE | Admit: 2022-09-23 | Discharge: 2022-09-23 | Disposition: A | Discharge status: Home / Self Care | Payer: Medicare Other | Source: Ambulatory Visit | Attending: Vascular Surgery | Admitting: Vascular Surgery

## 2022-09-23 VITALS — BP 152/92 | HR 73 | Temp 97.6°F | Wt 187.0 lb

## 2022-09-23 DIAGNOSIS — I6523 Occlusion and stenosis of bilateral carotid arteries: Secondary | ICD-10-CM | POA: Insufficient documentation

## 2022-09-23 NOTE — Progress Notes (Signed)
Office Note     CC:  follow up Requesting Provider:  Ardith Dark, MD  HPI: NIVEN CAPPUCCIO is a 72 y.o. (1951/02/20) male who presents for surveillance of carotid artery stenosis.  He denies any diagnosis of CVA or TIA since last office visit 1 year ago.  He also denies any neurological events including slurring speech, changes in vision, or one-sided weakness.  He takes a aspirin and statin daily.  He follows regularly with his cardiologist with history of CABG and a pacemaker.  He believes his cholesterol is well-controlled.  He denies tobacco use.  He also denies any claudication or tissue changes of bilateral lower extremities.   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    noted on EKG March 2013   CAD (coronary artery disease)    s/p atherectomy, PTCA/DES x2 mid & distal RCA 06/30/12   Carotid artery occlusion    Chest pain    Complication of anesthesia    heart rate dropping with propofol last procedure   COPD (chronic obstructive pulmonary disease) ([facility])    Dyspnea    ED (erectile dysfunction)    Esophageal stricture    External hemorrhoids without mention of complication    GERD (gastroesophageal reflux disease)    Headache(784.0)    Hiatal hernia    History of kidney stones    HLD (hyperlipidemia)    HTN (hypertension)    Myocardial infarction ([facility])    1996   Personal history of colonic polyps 03/12/2010   TUBULAR ADENOMA   Presence of permanent cardiac pacemaker    Sleep apnea    wears CPAP   Tobacco abuse    Tubular adenoma of colon    Type 2 diabetes mellitus ([facility])     Past Surgical History:  Procedure Laterality Date   ANGIOPLASTY     X3   BACK SURGERY     CARDIAC CATHETERIZATION     8 years ago stents   COLONOSCOPY N/A 02/16/2017   Procedure: COLONOSCOPY;  Surgeon: Beverley Fiedler, MD;  [facility];  Service: Gastroenterology;  Laterality: N/A;   COLONOSCOPY     CORONARY ARTERY BYPASS GRAFT  N/A 07/19/2020   Procedure: CORONARY ARTERY BYPASS GRAFTING (CABG) TIMES THREE USING LEFT INTERNAL MAMMARY ARTERY AND RIGHT GREATER SAPHENOUS VEIN HARVESTED ENDOSCOPICALLY;  Surgeon: Alleen Borne, MD;  [facility];  Service: Open Heart Surgery;  Laterality: N/A;   INSERT / REPLACE / REMOVE PACEMAKER  06/20/2018   LEFT HEART CATH AND CORONARY ANGIOGRAPHY N/A 11/05/2016   Procedure: Left Heart Cath and Coronary Angiography;  Surgeon: Yvonne Kendall, MD;  [facility];  Service: Cardiovascular;  Laterality: N/A;   LEFT HEART CATH AND CORONARY ANGIOGRAPHY N/A 07/12/2020   Procedure: LEFT HEART CATH AND CORONARY ANGIOGRAPHY;  Surgeon: Kathleene Hazel, MD;  [facility];  Service: Cardiovascular;  Laterality: N/A;   LUMBAR LAMINECTOMY/DECOMPRESSION MICRODISCECTOMY N/A 05/12/2018   Procedure: Microlumbar decompression L2-3, possible L1-2;  Surgeon: Jene Every, MD;  [facility];  Service: Orthopedics;  Laterality: N/A;  2 hrs, Microlumbar decompression L2-3, possible L1-2   PACEMAKER IMPLANT N/A 06/20/2018   Procedure: PACEMAKER IMPLANT;  Surgeon: Marinus Maw, MD;  [facility];  Service: Cardiovascular;  Laterality: N/A;   PERCUTANEOUS CORONARY ROTOBLATOR INTERVENTION (PCI-R) N/A 06/30/2012   Procedure: PERCUTANEOUS CORONARY ROTOBLATOR INTERVENTION (PCI-R);  Surgeon: Kathleene Hazel,  MD;  [facility];  Service: Cardiovascular;  Laterality: N/A;   TEE WITHOUT CARDIOVERSION N/A 07/19/2020   Procedure: TRANSESOPHAGEAL ECHOCARDIOGRAM (TEE);  Surgeon: Alleen Borne, MD;  Location: [facility] OR;  Service: Open Heart Surgery;  Laterality: N/A;   VIDEO BRONCHOSCOPY  08/13/2011   Procedure: VIDEO BRONCHOSCOPY WITHOUT FLUORO;  Surgeon: Nyoka Cowden, MD;  [facility];  Service: Cardiopulmonary;  Laterality: Bilateral;    Social History   Socioeconomic History   Marital status: Married    Spouse name: Not on  file   Number of children: 1   Years of education: Not on file   Highest education level: Not on file  Occupational History   Occupation: Regulatory affairs officer  Tobacco Use   Smoking status: Former    Packs/day: 0.50    Years: 40.00    Additional pack years: 0.00    Total pack years: 20.00    Types: Cigarettes    Quit date: 08/25/2011    Years since quitting: 11.0   Smokeless tobacco: Never  Vaping Use   Vaping Use: Never used  Substance and Sexual Activity   Alcohol use: Not Currently   Drug use: No   Sexual activity: Not Currently  Other Topics Concern   Not on file  Social History Narrative   Not on file   Social Determinants of Health   Financial Resource Strain: Not on file  Food Insecurity: Not on file  Transportation Needs: No Transportation Needs (07/07/2022)   PRAPARE - Administrator, Civil Service (Medical): No    Lack of Transportation (Non-Medical): No  Physical Activity: Not on file  Stress: Not on file  Social Connections: Not on file  Intimate Partner Violence: Not on file    Family History  Problem Relation Age of Onset   Diabetes Mother        and brother    Kidney failure Mother    Heart disease Father        hx of MI   Breast cancer Sister    Colon cancer Neg Hx    Stomach cancer Neg Hx    Esophageal cancer Neg Hx    Rectal cancer Neg Hx     Current Outpatient Medications  Medication Sig Dispense Refill   acetaminophen (TYLENOL) 500 MG tablet Take 1,000 mg by mouth every 6 (six) hours as needed (pain).      albuterol (VENTOLIN HFA) 108 (90 Base) MCG/ACT inhaler INHALE 2 PUFFS INTO THE LUNGS EVERY 6 HOURS AS NEEDED FOR WHEEZE OR SHORTNESS OF BREATH 8.5 each 3   aspirin EC 81 MG tablet Take 81 mg by mouth daily. Swallow whole.     Blood Glucose Calibration (ONETOUCH VERIO) High LIQD      celecoxib (CELEBREX) 200 MG capsule Take 200 mg by mouth as needed for mild pain (hip pain).     ezetimibe (ZETIA) 10 MG tablet Take 1 tablet  (10 mg total) by mouth daily. 90 tablet 3   famotidine (PEPCID) 20 MG tablet Take 20 mg by mouth daily as needed for heartburn.     fenofibrate (TRICOR) 145 MG tablet Take 1 tablet (145 mg total) by mouth daily. 90 tablet 3   hydrochlorothiazide (HYDRODIURIL) 25 MG tablet TAKE 1/2 TABLET (12.5 MG TOTAL) BY MOUTH DAILY AS NEEDED. 90 tablet 1   ipratropium (ATROVENT) 0.06 % nasal spray PLACE 2 SPRAYS INTO BOTH NOSTRILS 4 (FOUR) TIMES DAILY AS DIRECTED 13 mL 11   metoprolol  tartrate (LOPRESSOR) 25 MG tablet TAKE 1/2 TABLET TWICE A DAY BY MOUTH 90 tablet 3   nitroGLYCERIN (NITROSTAT) 0.4 MG SL tablet Place 1 tablet (0.4 mg total) under the tongue every 5 (five) minutes as needed for chest pain. 25 tablet 6   omeprazole (PRILOSEC) 40 MG capsule Take 1 capsule (40 mg total) by mouth daily. 90 capsule 3   ONETOUCH VERIO test strip 1 each daily.     Pharmacist Choice Lancets MISC      rosuvastatin (CRESTOR) 20 MG tablet TAKE 1 TABLET BY MOUTH EVERY DAY 90 tablet 3   No current facility-administered medications for this visit.    Allergies  Allergen Reactions   Amlodipine Besylate Other (See Comments)    Headache, weakness, dizziness   Propofol Other (See Comments)    Heart rate dropped     Lisinopril Cough and Rash   Metoprolol     Lowered heart rate too much    Rosuvastatin Other (See Comments)     Corncern for muscle aches but he is tolerating now      REVIEW OF SYSTEMS:   [X]  denotes positive finding, [ ]  denotes negative finding Cardiac  Comments:  Chest pain or chest pressure:    Shortness of breath upon exertion:    Short of breath when lying flat:    Irregular heart rhythm:        Vascular    Pain in calf, thigh, or hip brought on by ambulation:    Pain in feet at night that wakes you up from your sleep:     Blood clot in your veins:    Leg swelling:         Pulmonary    Oxygen at home:    Productive cough:     Wheezing:         Neurologic    Sudden weakness in  arms or legs:     Sudden numbness in arms or legs:     Sudden onset of difficulty speaking or slurred speech:    Temporary loss of vision in one eye:     Problems with dizziness:         Gastrointestinal    Blood in stool:     Vomited blood:         Genitourinary    Burning when urinating:     Blood in urine:        Psychiatric    Major depression:         Hematologic    Bleeding problems:    Problems with blood clotting too easily:        Skin    Rashes or ulcers:        Constitutional    Fever or chills:      PHYSICAL EXAMINATION:  Vitals:   09/23/22 1242 09/23/22 1243  BP: (!) 150/86 (!) 152/92  Pulse: 73   Temp: 97.6 F (36.4 C)   TempSrc: Temporal   SpO2: 95%   Weight: 187 lb (84.8 kg)     General:  WDWN in NAD; vital signs documented above Gait: Not observed HENT: WNL, normocephalic Pulmonary: normal non-labored breathing , without Rales, rhonchi,  wheezing Cardiac: regular HR Abdomen: soft, NT, no masses Skin: without rashes Vascular Exam/Pulses: Symmetrical radial pulses Extremities: without ischemic changes, without Gangrene , without cellulitis; without open wounds;  Musculoskeletal: no muscle wasting or atrophy  Neurologic: A&O X 3; cranial nerves grossly intact Psychiatric:  The pt has Normal affect.  Non-Invasive Vascular Imaging:   Bilateral ICA 40 to 59% stenosis    ASSESSMENT/PLAN:: 72 y.o. male here for follow up for surveillance of carotid artery stenosis   -Carotid artery stenosis remains asymptomatic.  He has not experienced any neurological events since last office visit.  Carotid duplex demonstrates 40 to 59% stenosis of bilateral internal carotid arteries.  No indication for intervention at this time.  Continue aspirin and statin daily.  Continue regular follow-up with cardiology.  We will repeat carotid duplex in 1 year.  He knows to seek immediate medical attention should he experience any strokelike symptoms.   Emilie Rutter, PA-C [facility] [phone]  Clinic MD:   Randie Heinz

## 2022-09-24 ENCOUNTER — Other Ambulatory Visit: Payer: Self-pay

## 2022-09-24 DIAGNOSIS — I6523 Occlusion and stenosis of bilateral carotid arteries: Secondary | ICD-10-CM

## 2022-09-25 DIAGNOSIS — M5412 Radiculopathy, cervical region: Secondary | ICD-10-CM | POA: Diagnosis not present

## 2022-09-28 ENCOUNTER — Other Ambulatory Visit (HOSPITAL_COMMUNITY): Payer: Self-pay | Admitting: Neurosurgery

## 2022-09-28 DIAGNOSIS — M5412 Radiculopathy, cervical region: Secondary | ICD-10-CM

## 2022-10-01 ENCOUNTER — Ambulatory Visit (INDEPENDENT_AMBULATORY_CARE_PROVIDER_SITE_OTHER): Payer: Medicare Other

## 2022-10-01 DIAGNOSIS — I459 Conduction disorder, unspecified: Secondary | ICD-10-CM

## 2022-10-01 LAB — CUP PACEART REMOTE DEVICE CHECK
Battery Remaining Longevity: 60 mo
Battery Remaining Percentage: 57 %
Battery Voltage: 2.99 V
Brady Statistic AP VP Percent: 4.8 %
Brady Statistic AP VS Percent: 1 %
Brady Statistic AS VP Percent: 95 %
Brady Statistic AS VS Percent: 1 %
Brady Statistic RA Percent Paced: 4.3 %
Brady Statistic RV Percent Paced: 99 %
Date Time Interrogation Session: 20240613020013
Implantable Lead Connection Status: 753985
Implantable Lead Connection Status: 753985
Implantable Lead Implant Date: 20200302
Implantable Lead Implant Date: 20200302
Implantable Lead Location: 753859
Implantable Lead Location: 753860
Implantable Lead Model: 3830
Implantable Pulse Generator Implant Date: 20200302
Lead Channel Impedance Value: 460 Ohm
Lead Channel Impedance Value: 510 Ohm
Lead Channel Pacing Threshold Amplitude: 0.75 V
Lead Channel Pacing Threshold Amplitude: 0.75 V
Lead Channel Pacing Threshold Pulse Width: 0.5 ms
Lead Channel Pacing Threshold Pulse Width: 0.5 ms
Lead Channel Sensing Intrinsic Amplitude: 12 mV
Lead Channel Sensing Intrinsic Amplitude: 2.1 mV
Lead Channel Setting Pacing Amplitude: 2 V
Lead Channel Setting Pacing Amplitude: 2.5 V
Lead Channel Setting Pacing Pulse Width: 0.5 ms
Lead Channel Setting Sensing Sensitivity: 4 mV
Pulse Gen Model: 2272
Pulse Gen Serial Number: 9114136

## 2022-10-02 DIAGNOSIS — G4733 Obstructive sleep apnea (adult) (pediatric): Secondary | ICD-10-CM | POA: Diagnosis not present

## 2022-10-06 ENCOUNTER — Other Ambulatory Visit: Payer: Self-pay | Admitting: Surgery

## 2022-10-06 DIAGNOSIS — M5412 Radiculopathy, cervical region: Secondary | ICD-10-CM

## 2022-10-20 ENCOUNTER — Ambulatory Visit
Admission: RE | Admit: 2022-10-20 | Discharge: 2022-10-20 | Disposition: A | Discharge status: Home / Self Care | Payer: Medicare Other | Source: Ambulatory Visit | Attending: Surgery | Admitting: Surgery

## 2022-10-20 DIAGNOSIS — M5412 Radiculopathy, cervical region: Secondary | ICD-10-CM | POA: Diagnosis not present

## 2022-10-20 DIAGNOSIS — M4802 Spinal stenosis, cervical region: Secondary | ICD-10-CM | POA: Diagnosis not present

## 2022-10-20 DIAGNOSIS — M4722 Other spondylosis with radiculopathy, cervical region: Secondary | ICD-10-CM | POA: Diagnosis not present

## 2022-10-20 MED ORDER — DIAZEPAM 5 MG PO TABS
5.0000 mg | ORAL_TABLET | Freq: Once | ORAL | Status: AC
  Administered 2022-10-20: 5 mg via ORAL

## 2022-10-20 MED ORDER — ONDANSETRON HCL 4 MG/2ML IJ SOLN: 4.0000 mg | Freq: Once | INTRAMUSCULAR | Status: DC | PRN

## 2022-10-20 MED ORDER — IOPAMIDOL (ISOVUE-M 300) INJECTION 61%
10.0000 mL | Freq: Once | INTRAMUSCULAR | Status: AC
  Administered 2022-10-20: 10 mL via INTRATHECAL

## 2022-10-20 MED ORDER — MEPERIDINE HCL 50 MG/ML IJ SOLN: 50.0000 mg | Freq: Once | INTRAMUSCULAR | Status: DC | PRN

## 2022-10-20 NOTE — Progress Notes (Signed)
Remote pacemaker transmission.   

## 2022-10-20 NOTE — Discharge Instructions (Signed)

## 2022-10-26 DIAGNOSIS — M5412 Radiculopathy, cervical region: Secondary | ICD-10-CM | POA: Diagnosis not present

## 2022-10-27 ENCOUNTER — Telehealth: Payer: Self-pay | Admitting: *Deleted

## 2022-10-27 NOTE — Telephone Encounter (Signed)
   Pre-operative Risk Assessment    Patient Name: Daniel Carey  DOB: [birth year] MRN: [id number]      Request for Surgical Clearance    Procedure:   CERVICAL FUSIONI  Date of Surgery:  Clearance TBD                                 Surgeon:  DR. Hoyt Koch Surgeon's Group or Practice Name:  [facility] Phone number:  [phone] Fax number:  [phone]   Type of Clearance Requested:   - Medical  - Pharmacy:  Hold Aspirin NOT INDICATED HOW LONG   Type of Anesthesia:  General    Additional requests/questions:    Wilhemina Cash   10/27/2022, 3:02 PM

## 2022-10-28 NOTE — Telephone Encounter (Signed)
   Name: Daniel Carey  DOB: 04/16/1951  MRN: [id number]  Primary Cardiologist: Kristeen Miss, MD  Chart reviewed as part of pre-operative protocol coverage. Because of Daniel Carey past medical history and time since last visit, he will require a follow-up telephone visit in order to better assess preoperative cardiovascular risk.  Pre-op covering staff: - Please schedule appointment and call patient to inform them. If patient already had an upcoming appointment within acceptable timeframe, please add "pre-op clearance" to the appointment notes so provider is aware. - Please contact requesting surgeon's office via preferred method (i.e, phone, fax) to inform them of need for appointment prior to surgery.  He is status post remote stenting back in 2014, more recent CABG a little over 2 years ago.  Can hold ASA x 5 days prior to procedure and restart when medically safe to do so.   Sharlene Dory, PA-C  10/28/2022, 8:06 AM

## 2022-10-28 NOTE — Telephone Encounter (Signed)
I left a message for the pt to call back in regard to pre op clearance. Per op APP stated to scheduled tele pre op appt. However the pt has an appt in office with Dr. Elease Hashimoto 11/16/22. I did leave message for the pt to call back and confirm if he wants a tele appt as well or just see MD on 11/16/22 for pre op clearance. If also wants tele appt this means 2 appts that will be billed to his insurance company.

## 2022-10-29 ENCOUNTER — Telehealth: Payer: Self-pay | Admitting: *Deleted

## 2022-10-29 NOTE — Telephone Encounter (Signed)
PATIENT SCHEDULED 11-03-22 CALL MED REC CONSENT DONE.Marland KitchenSMO

## 2022-10-29 NOTE — Telephone Encounter (Signed)
  Patient Consent for Virtual Visit   PATIENT SCHEDULED 11-03-22  MED REC CONSENT DONE.Marland KitchenSMO       [name] has provided verbal consent on 10/29/2022 for a virtual visit (video or telephone).   CONSENT FOR VIRTUAL VISIT FOR:  Daniel Carey  By participating in this virtual visit I agree to the following:  I hereby voluntarily request, consent and authorize [facility] and its employed or contracted physicians, physician assistants, nurse practitioners or other licensed health care professionals (the Practitioner), to provide me with telemedicine health care services (the "Services") as deemed necessary by the treating Practitioner. I acknowledge and consent to receive the Services by the Practitioner via telemedicine. I understand that the telemedicine visit will involve communicating with the Practitioner through live audiovisual communication technology and the disclosure of certain medical information by electronic transmission. I acknowledge that I have been given the opportunity to request an in-person assessment or other available alternative prior to the telemedicine visit and am voluntarily participating in the telemedicine visit.  I understand that I have the right to withhold or withdraw my consent to the use of telemedicine in the course of my care at any time, without affecting my right to future care or treatment, and that the Practitioner or I may terminate the telemedicine visit at any time. I understand that I have the right to inspect all information obtained and/or recorded in the course of the telemedicine visit and may receive copies of available information for a reasonable fee.  I understand that some of the potential risks of receiving the Services via telemedicine include:  Delay or interruption in medical evaluation due to technological equipment failure or disruption; Information transmitted may not be sufficient (e.g. poor resolution of images) to allow for  appropriate medical decision making by the Practitioner; and/or  In rare instances, security protocols could fail, causing a breach of personal health information.  Furthermore, I acknowledge that it is my responsibility to provide information about my medical history, conditions and care that is complete and accurate to the best of my ability. I acknowledge that Practitioner's advice, recommendations, and/or decision may be based on factors not within their control, such as incomplete or inaccurate data provided by me or distortions of diagnostic images or specimens that may result from electronic transmissions. I understand that the practice of medicine is not an exact science and that Practitioner makes no warranties or guarantees regarding treatment outcomes. I acknowledge that a copy of this consent can be made available to me via my patient portal [facility] MyChart), or I can request a printed copy by calling the office of [facility].    I understand that my insurance will be billed for this visit.   I have read or had this consent read to me. I understand the contents of this consent, which adequately explains the benefits and risks of the Services being provided via telemedicine.  I have been provided ample opportunity to ask questions regarding this consent and the Services and have had my questions answered to my satisfaction. I give my informed consent for the services to be provided through the use of telemedicine in my medical care

## 2022-10-29 NOTE — Telephone Encounter (Signed)
Lmtrc to discuss  telephone visit call or keep already scheduled Appt with Dr Elease Hashimoto 11-16-2022

## 2022-11-01 DIAGNOSIS — G4733 Obstructive sleep apnea (adult) (pediatric): Secondary | ICD-10-CM | POA: Diagnosis not present

## 2022-11-03 ENCOUNTER — Ambulatory Visit: Payer: Medicare Other | Attending: Cardiology

## 2022-11-03 DIAGNOSIS — Z01810 Encounter for preprocedural cardiovascular examination: Secondary | ICD-10-CM | POA: Diagnosis not present

## 2022-11-03 NOTE — Progress Notes (Signed)
Virtual Visit via Telephone Note   Because of Daniel Carey's co-morbid illnesses, he is at least at moderate risk for complications without adequate follow up.  This format is felt to be most appropriate for this patient at this time.  The patient did not have access to video technology/had technical difficulties with video requiring transitioning to audio format only (telephone).  All issues noted in this document were discussed and addressed.  No physical exam could be performed with this format.  Please refer to the patient's chart for his consent to telehealth for [facility].  Evaluation Performed:  Preoperative cardiovascular risk assessment _____________   Date:  11/03/2022   Patient ID:  Daniel Carey, DOB 08/28/1950, MRN [id number] Patient Location:  Home Provider location:   Office  Primary Care Provider:  Ardith Dark, MD Primary Cardiologist:  Kristeen Miss, MD  Chief Complaint / Patient Profile   72 y.o. y/o male with a h/o complete heart block, PPM, hypertension, coronary artery disease who is pending cervical fusion and presents today for telephonic preoperative cardiovascular risk assessment.  History of Present Illness    Daniel Carey is a 73 y.o. male who presents via audio/video conferencing for a telehealth visit today.  Pt was last seen in cardiology clinic on 01/20/2022 by Dr. Ladona Ridgel.  At that time Daniel Carey was doing well .  The patient is now pending procedure as outlined above. Since his last visit, he remains stable from a cardiac standpoint.  Today he denies chest pain, shortness of breath, lower extremity edema, fatigue, palpitations, melena, hematuria, hemoptysis, diaphoresis, weakness, presyncope, syncope, orthopnea, and PND.   Past Medical History    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    noted on EKG March 2013   CAD (coronary artery disease)    s/p atherectomy, PTCA/DES  x2 mid & distal RCA 06/30/12   Carotid artery occlusion    Chest pain    Complication of anesthesia    heart rate dropping with propofol last procedure   COPD (chronic obstructive pulmonary disease) (HCC)    Dyspnea    ED (erectile dysfunction)    Esophageal stricture    External hemorrhoids without mention of complication    GERD (gastroesophageal reflux disease)    Headache(784.0)    Hiatal hernia    History of kidney stones    HLD (hyperlipidemia)    HTN (hypertension)    Myocardial infarction (HCC)    1996   Personal history of colonic polyps 03/12/2010   TUBULAR ADENOMA   Presence of permanent cardiac pacemaker    Sleep apnea    wears CPAP   Tobacco abuse    Tubular adenoma of colon    Type 2 diabetes mellitus (HCC)    Past Surgical History:  Procedure Laterality Date   ANGIOPLASTY     X3   BACK SURGERY     CARDIAC CATHETERIZATION     8 years ago stents   COLONOSCOPY N/A 02/16/2017   Procedure: COLONOSCOPY;  Surgeon: Beverley Fiedler, MD;  [facility];  Service: Gastroenterology;  Laterality: N/A;   COLONOSCOPY     CORONARY ARTERY BYPASS GRAFT N/A 07/19/2020   Procedure: CORONARY ARTERY BYPASS GRAFTING (CABG) TIMES THREE USING LEFT INTERNAL MAMMARY ARTERY AND RIGHT GREATER SAPHENOUS VEIN HARVESTED ENDOSCOPICALLY;  Surgeon: Alleen Borne, MD;  [facility];  Service: Open Heart Surgery;  Laterality:  N/A;   INSERT / REPLACE / REMOVE PACEMAKER  06/20/2018   LEFT HEART CATH AND CORONARY ANGIOGRAPHY N/A 11/05/2016   Procedure: Left Heart Cath and Coronary Angiography;  Surgeon: Yvonne Kendall, MD;  [facility];  Service: Cardiovascular;  Laterality: N/A;   LEFT HEART CATH AND CORONARY ANGIOGRAPHY N/A 07/12/2020   Procedure: LEFT HEART CATH AND CORONARY ANGIOGRAPHY;  Surgeon: Kathleene Hazel, MD;  [facility];  Service: Cardiovascular;  Laterality: N/A;   LUMBAR LAMINECTOMY/DECOMPRESSION MICRODISCECTOMY N/A 05/12/2018    Procedure: Microlumbar decompression L2-3, possible L1-2;  Surgeon: Jene Every, MD;  [facility];  Service: Orthopedics;  Laterality: N/A;  2 hrs, Microlumbar decompression L2-3, possible L1-2   PACEMAKER IMPLANT N/A 06/20/2018   Procedure: PACEMAKER IMPLANT;  Surgeon: Marinus Maw, MD;  [facility];  Service: Cardiovascular;  Laterality: N/A;   PERCUTANEOUS CORONARY ROTOBLATOR INTERVENTION (PCI-R) N/A 06/30/2012   Procedure: PERCUTANEOUS CORONARY ROTOBLATOR INTERVENTION (PCI-R);  Surgeon: Kathleene Hazel, MD;  Location: [facility] CATH LAB;  Service: Cardiovascular;  Laterality: N/A;   TEE WITHOUT CARDIOVERSION N/A 07/19/2020   Procedure: TRANSESOPHAGEAL ECHOCARDIOGRAM (TEE);  Surgeon: Alleen Borne, MD;  Location: [facility] OR;  Service: Open Heart Surgery;  Laterality: N/A;   VIDEO BRONCHOSCOPY  08/13/2011   Procedure: VIDEO BRONCHOSCOPY WITHOUT FLUORO;  Surgeon: Nyoka Cowden, MD;  [facility];  Service: Cardiopulmonary;  Laterality: Bilateral;    Allergies  Allergies  Allergen Reactions   Amlodipine Besylate Other (See Comments)    Headache, weakness, dizziness   Propofol Other (See Comments)    Heart rate dropped     Lisinopril Cough and Rash   Rosuvastatin Other (See Comments)     Corncern for muscle aches but he is tolerating now     Home Medications    Prior to Admission medications   Medication Sig Start Date End Date Taking? Authorizing Provider  acetaminophen (TYLENOL) 500 MG tablet Take 1,000 mg by mouth every 6 (six) hours as needed (pain).     [provider]  albuterol (VENTOLIN HFA) 108 (90 Base) MCG/ACT inhaler INHALE 2 PUFFS INTO THE LUNGS EVERY 6 HOURS AS NEEDED FOR WHEEZE OR SHORTNESS OF BREATH 07/21/22   Ardith Dark, MD  aspirin EC 81 MG tablet Take 81 mg by mouth daily. Swallow whole.    [provider]  Blood Glucose Calibration (ONETOUCH VERIO) High LIQD  05/12/22   [provider]  celecoxib  (CELEBREX) 200 MG capsule Take 200 mg by mouth as needed for mild pain (hip pain). 11/28/21   [provider]  ezetimibe (ZETIA) 10 MG tablet Take 1 tablet (10 mg total) by mouth daily. 11/19/21   Nahser, Deloris Ping, MD  famotidine (PEPCID) 20 MG tablet Take 20 mg by mouth daily as needed for heartburn.    [provider]  fenofibrate (TRICOR) 145 MG tablet Take 1 tablet (145 mg total) by mouth daily. 05/14/22   Nahser, Deloris Ping, MD  hydrochlorothiazide (HYDRODIURIL) 25 MG tablet TAKE 1/2 TABLET (12.5 MG TOTAL) BY MOUTH DAILY AS NEEDED. 12/08/21   Marinus Maw, MD  ipratropium (ATROVENT) 0.06 % nasal spray PLACE 2 SPRAYS INTO BOTH NOSTRILS 4 (FOUR) TIMES DAILY AS DIRECTED 08/31/22   Ardith Dark, MD  metoprolol tartrate (LOPRESSOR) 25 MG tablet TAKE 1/2 TABLET TWICE A DAY BY MOUTH 02/04/22   Marinus Maw, MD  nitroGLYCERIN (NITROSTAT) 0.4 MG SL tablet Place 1 tablet (0.4 mg total)  under the tongue every 5 (five) minutes as needed for chest pain. 08/04/19   Tereso Newcomer T, PA-C  omeprazole (PRILOSEC) 40 MG capsule Take 1 capsule (40 mg total) by mouth daily. 06/25/21   Ardith Dark, MD  [facility] VERIO test strip 1 each daily. 05/12/22   [provider]  Pharmacist Choice Lancets MISC  05/12/22   [provider]  rosuvastatin (CRESTOR) 20 MG tablet TAKE 1 TABLET BY MOUTH EVERY DAY 12/23/21   Nahser, Deloris Ping, MD    Physical Exam    Vital Signs:  Daniel Carey does not have vital signs available for review today.  Given telephonic nature of communication, physical exam is limited. AAOx3. NAD. Normal affect.  Speech and respirations are unlabored.  Accessory Clinical Findings    None  Assessment & Plan    1.  Preoperative Cardiovascular Risk Assessment: Cervical fusion Dr. Hoyt Koch, [facility], [phone]      Primary Cardiologist: Kristeen Miss, MD  Chart reviewed as part of pre-operative protocol coverage. Given past  medical history and time since last visit, based on ACC/AHA guidelines, Daniel Carey would be at acceptable risk for the planned procedure without further cardiovascular testing.   His RCRI is a class III risk, 6.6% risk of major cardiac event.  He is able to complete greater than 4 METS of physical activity.  He is status post remote stenting back in 2014, more recent CABG a little over 2 years ago.  Can hold ASA x 5 days prior to procedure and restart when medically safe to do so.   Patient was advised that if he develops new symptoms prior to surgery to contact our office to arrange a follow-up appointment.  He verbalized understanding.      Time:   Today, I have spent 5 minutes with the patient with telehealth technology discussing medical history, symptoms, and management plan.  Prior to his phone evaluation I spent greater than 10 minutes reviewing his past medical history and cardiac medications.  Ronney Asters, NP  11/03/2022, 7:57 AM

## 2022-11-11 ENCOUNTER — Other Ambulatory Visit: Payer: Self-pay | Admitting: Neurosurgery

## 2022-11-11 DIAGNOSIS — G4733 Obstructive sleep apnea (adult) (pediatric): Secondary | ICD-10-CM | POA: Diagnosis not present

## 2022-11-14 ENCOUNTER — Encounter: Payer: Self-pay | Admitting: Cardiovascular Disease

## 2022-11-14 NOTE — Progress Notes (Unsigned)
Daniel Carey Date of Birth  Dec 29, 1950       [facility]    [place] [address]  [address], [address] [place], [place]  [address]   [place], [place]  [address] [phone]     [phone]   Fax  [phone]    Fax [phone]  Problem List: 1. CAD - 2.5 x 38 mm Promus Premier DES in the distal RCA and a 2.75 x 38 mm Promus Premier DES in the mid vessel in an overlapping fashion. The distal stent was post-dilated with a 2.5 x 20 mm [place] balloon. The proximal portion of the stent was post-dilated with a 3.0 x 20 mm [place] balloon ( June 30, 2012) current: The care  2. Bradycardia after getting propafol for endoscopy. 3. Obstructive sleep apnea:   :  Daniel Carey is feeling well today.  He's been getting some exercise. He does yard work and walks on the treadmill occasionally. He had an episode of bradycardia after getting IV protocol for an endoscopy procedure. He was seen by Daniel Carey. His metoprolol dose was decreased. He has also had a bronchoscopy with Dr. Sherene Carey. He does not have any evidence of COPD.  Both he and his wife have stop smoking.  ( Aug 20, 2011)  Feb. 28, 2014:  He continues to have increasing chest p pain Daniel Carey ain.  He has not been smoking.  The pains are associated with exertion, last 5-10 minutes, resolve with 1 SL NTG.   He's had a mildly elevated TSH for the past year or so and has had some fatigue.    his TSH level 6.8.      His medical doctor started him on Synthroid. He gradually has titrated up to 0.1 mg a day. Since that time he's had increasing episodes of chest pain. All so the house that taxes at the same time present all is doing the same time in the summer and not a [facility] Aug 29, 2012: We will show  Daniel Carey saw him in February we performed a cardiac catheterization because of increasing chest pain. He was found to have sequential stenosis in his mid right coronary artery and had PCI of his right coronary artery ( 2.5 x  38 mm Promus Premier DES in the distal RCA and a 2.75 x 38 mm Promus Premier DES in the mid vessel in an overlapping fashion. The distal sten was post-dilated with a 2.5 x 20 mm [place] balloon. The proximal portion of the stent was post-dilated with a 3.0 x 20 mm [place] balloon)  He and his wife are concerned about whether or not the Plavix is active.  His generic plavix manufacturer has changed.   He would like to be tested.   October 14, 2012:  Daniel Carey has been having some occasional CP.  He has had some episodes of dyspnea while climbing up a hill playing golf.  This past Monday night, he had some chest pain - possible due to indigestions.  these  He had lost his NTG - he has refilled it now.    Nov. 10, 2014:  Daniel Carey is doing well.  He is working too much according to wife. he's had a few CP but not severe enough to take NTG.  Still has some dyspnea with climbing steps.   He had stenting in march 2014.  October 23, 2013:  Daniel Carey is doing well.   Playing golf.  Working in the yard.  Not  exercising as much.   He has lost about 6 lbs.  He had labs drawn today.     Jan. 7, 2016:  He has been waking up in the middle of the night and feels like he cant catch his  Breath. Still working out - on the treadmill twice this week and has done well.   Sept. 1, 2016:  Has had some short of breath. Does not get any exercise.  His CP is much better since starting CPAP .  Is having bloating  - thinks its due to the lipitor   July 19, 2015: Has had some shortness of breath.   With exertion . Happens occasionally but seem to be getting more frequently .  BP is elevated today  Has cut back on his salt .   Sept. 21, 2017:  Daniel Carey is seen today for follow up visit for his CAD Occasionally short of breath . Comes and goes .  BP is a little elevated today Has occasional CP  Able to do all that he wants to do   November 04, 2016: Still having some chest pain Not exertional ,  Seems very similar to his previous  episodes of angina prior to stenting .   July 31 , 2108  Daniel Carey is seen today for follow up of a recent cath.  He was found to have diffuse moderate - severe CAD - especially in the LAD  Was started on Imdur - is having a headache with that. Angina seems to be slightly  better.  He still has marked DOE - wife is very concerned about this .   is going to see pulmonary soon .  Sept. 24, 2018:  Daniel Carey is doing very well. His chest pain seemed to be better. Getting some exercise   A little bit more salt than usual over the weekend. His diastolic blood pressure today is 90.  Nov. 8, 2018:    Daniel Carey is seen today  Has worsening dyspnea.   Chest tightness Also had a bladder infection  - burning with urination. ,   Fevers, chills.   Temp of 99.8 Still having fevers and chills  His dyspnea seems to be worse with his chills.  No blood in urine,   extremy dysuria and frequency + cough , no hemoptysis, No pleuretic CP  No real angina   April 08, 2017:  Daniel Carey is seen back for follow up visit Has urosepsis when I last saw him ( UTI with bacteremia )  Treated with ABx Now is having dysuria .   Is seeing urologist tomorrow .   No serious angina .   December 23, 2017:  Daniel Carey is seen today for follow-up of his coronary artery disease. Had a colonoscopy  Complicated by a E.coli UTI when I last saw him  Rare episodes of CP   Sept. 2, 2020   Doing well Had a pacer placed a month ago  Feels beter Has some CP if he starts walks on the treadmill.   Is able to walk through it and usually does ok  Lasts for a minutes.  Last cath was July 2019.  Conclusions: Multivessel coronary artery disease, including diffuse calcified LAD disease of up to 60-70% (FFR 0.73), moderate to severe LCx and OM3 disease (FFR 0.83 at submaximal hyperemia), and moderate in-stent restenosis of mid RCA. Normal left ventricular filling pressure. Normal left ventricular systolic function.    Recommendations: Escalate antianginal therapy with addition of isosorbide mononitrate. Continue aggressive secondary  prevention, including dual antiplatelet and statin therapy. I will review the films with the intervention and cardiac surgery teams. Revascularization options include atherectomy and PCI to LAD (would require multiple stents) versus CABG.  He has found that if he takes an extra metoprolol about an hour before he exercises that his angina is much better controlled.  We will have him continue that dosing scheme.  June 22, 2019:  Occasional CP ,  Has some chest pressure while on the treadmill.   Rests for 5-10 min then the discomfort resolves Has not improved with takeing metoprolol before his workout.  He also notes he is having more shortness of breath.  Has cut his salt intake .  BP is elevated today .   Has not been elevated at home   His last heart catheterization was in 2018.  He was found to have diffuse coronary artery disease.  We increased medical therapy.  Tested + for Covid in January.   May still be recovering from that    Sept. 3, 2021: Seen for follow up of his CAD, HLD,  Pacer  S/p pacer for bradycardia  Still has some occasional episodes of CP when he is going to bed.  Atypical  No CP with activity  No pressure  Just pins and needles sensation    July 05, 2020 Hx of CAD, pacer, HLD Still having some CP on occasion .  Also has panic attacks  Has some numbness in his hands. Can walk on the treadmill.   Does ok as long as he has not eaten anything recently  Cp may be gradually getting worse.  Relieved with SL NTG  Is on metoprolol, imdur Is intol to amlodipine  Has these discomforts several times a week ,  He thinks is progressing   November 11, 2020:  Daniel Carey is seen for follow up of his CAD  He has severe 3 V cad  Had CABG April , 2022 Going to cardiac rehab.   At [facility]  Sternotomy looks good    November 11, 2021 Daniel Carey  is seen today  for follow-up of his coronary artery disease.  He is status postcoronary artery bypass grafting in April 2022. Is exercising regularly ,   November 16, 2022 Daniel Carey is seen for follow up of his CAD  S/p CABG in 2022   Current Outpatient Medications on File Prior to Visit  Medication Sig Dispense Refill   acetaminophen (TYLENOL) 500 MG tablet Take 1,000 mg by mouth every 6 (six) hours as needed (pain).      albuterol (VENTOLIN HFA) 108 (90 Base) MCG/ACT inhaler INHALE 2 PUFFS INTO THE LUNGS EVERY 6 HOURS AS NEEDED FOR WHEEZE OR SHORTNESS OF BREATH 8.5 each 3   aspirin EC 81 MG tablet Take 81 mg by mouth daily. Swallow whole.     Blood Glucose Calibration (ONETOUCH VERIO) High LIQD      celecoxib (CELEBREX) 200 MG capsule Take 200 mg by mouth as needed for mild pain (hip pain).     ezetimibe (ZETIA) 10 MG tablet Take 1 tablet (10 mg total) by mouth daily. 90 tablet 3   famotidine (PEPCID) 20 MG tablet Take 20 mg by mouth daily as needed for heartburn.     fenofibrate (TRICOR) 145 MG tablet Take 1 tablet (145 mg total) by mouth daily. 90 tablet 3   hydrochlorothiazide (HYDRODIURIL) 25 MG tablet TAKE 1/2 TABLET (12.5 MG TOTAL) BY MOUTH DAILY AS NEEDED. 90 tablet 1   ipratropium (ATROVENT)  0.06 % nasal spray PLACE 2 SPRAYS INTO BOTH NOSTRILS 4 (FOUR) TIMES DAILY AS DIRECTED 13 mL 11   metoprolol tartrate (LOPRESSOR) 25 MG tablet TAKE 1/2 TABLET TWICE A DAY BY MOUTH 90 tablet 3   nitroGLYCERIN (NITROSTAT) 0.4 MG SL tablet Place 1 tablet (0.4 mg total) under the tongue every 5 (five) minutes as needed for chest pain. 25 tablet 6   omeprazole (PRILOSEC) 40 MG capsule Take 1 capsule (40 mg total) by mouth daily. 90 capsule 3   ONETOUCH VERIO test strip 1 each daily.     Pharmacist Choice Lancets MISC      rosuvastatin (CRESTOR) 20 MG tablet TAKE 1 TABLET BY MOUTH EVERY DAY 90 tablet 3   No current facility-administered medications on file prior to visit.    Allergies  Allergen Reactions    Amlodipine Besylate Other (See Comments)    Headache, weakness, dizziness   Propofol Other (See Comments)    Heart rate dropped     Lisinopril Cough and Rash   Rosuvastatin Other (See Comments)     Corncern for muscle aches but he is tolerating now     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    noted on EKG March 2013   CAD (coronary artery disease)    s/p atherectomy, PTCA/DES x2 mid & distal RCA 06/30/12   Carotid artery occlusion    Chest pain    Complication of anesthesia    heart rate dropping with propofol last procedure   COPD (chronic obstructive pulmonary disease) (HCC)    Dyspnea    ED (erectile dysfunction)    Esophageal stricture    External hemorrhoids without mention of complication    GERD (gastroesophageal reflux disease)    Headache(784.0)    Hiatal hernia    History of kidney stones    HLD (hyperlipidemia)    HTN (hypertension)    Myocardial infarction (HCC)    1996   Personal history of colonic polyps 03/12/2010   TUBULAR ADENOMA   Presence of permanent cardiac pacemaker    Sleep apnea    wears CPAP   Tobacco abuse    Tubular adenoma of colon    Type 2 diabetes mellitus (HCC)     Past Surgical History:  Procedure Laterality Date   ANGIOPLASTY     X3   BACK SURGERY     CARDIAC CATHETERIZATION     8 years ago stents   COLONOSCOPY N/A 02/16/2017   Procedure: COLONOSCOPY;  Surgeon: Beverley Fiedler, MD;  [facility];  Service: Gastroenterology;  Laterality: N/A;   COLONOSCOPY     CORONARY ARTERY BYPASS GRAFT N/A 07/19/2020   Procedure: CORONARY ARTERY BYPASS GRAFTING (CABG) TIMES THREE USING LEFT INTERNAL MAMMARY ARTERY AND RIGHT GREATER SAPHENOUS VEIN HARVESTED ENDOSCOPICALLY;  Surgeon: Alleen Borne, MD;  [facility];  Service: Open Heart Surgery;  Laterality: N/A;   INSERT / REPLACE / REMOVE PACEMAKER  06/20/2018   LEFT HEART CATH AND CORONARY ANGIOGRAPHY N/A 11/05/2016    Procedure: Left Heart Cath and Coronary Angiography;  Surgeon: Yvonne Kendall, MD;  [facility];  Service: Cardiovascular;  Laterality: N/A;   LEFT HEART CATH AND CORONARY ANGIOGRAPHY N/A 07/12/2020   Procedure: LEFT HEART CATH AND CORONARY ANGIOGRAPHY;  Surgeon: Kathleene Hazel, MD;  [facility];  Service: Cardiovascular;  Laterality: N/A;   LUMBAR LAMINECTOMY/DECOMPRESSION MICRODISCECTOMY N/A 05/12/2018  Procedure: Microlumbar decompression L2-3, possible L1-2;  Surgeon: Jene Every, MD;  [facility];  Service: Orthopedics;  Laterality: N/A;  2 hrs, Microlumbar decompression L2-3, possible L1-2   PACEMAKER IMPLANT N/A 06/20/2018   Procedure: PACEMAKER IMPLANT;  Surgeon: Marinus Maw, MD;  [facility];  Service: Cardiovascular;  Laterality: N/A;   PERCUTANEOUS CORONARY ROTOBLATOR INTERVENTION (PCI-R) N/A 06/30/2012   Procedure: PERCUTANEOUS CORONARY ROTOBLATOR INTERVENTION (PCI-R);  Surgeon: Kathleene Hazel, MD;  Location: [facility] CATH LAB;  Service: Cardiovascular;  Laterality: N/A;   TEE WITHOUT CARDIOVERSION N/A 07/19/2020   Procedure: TRANSESOPHAGEAL ECHOCARDIOGRAM (TEE);  Surgeon: Alleen Borne, MD;  Location[facility] OR;  Service: Open Heart Surgery;  Laterality: N/A;   VIDEO BRONCHOSCOPY  08/13/2011   Procedure: VIDEO BRONCHOSCOPY WITHOUT FLUORO;  Surgeon: Nyoka Cowden, MD;  [facility];  Service: Cardiopulmonary;  Laterality: Bilateral;    Social History   Tobacco Use  Smoking Status Former   Current packs/day: 0.00   Average packs/day: 0.5 packs/day for 40.0 years (20.0 ttl pk-yrs)   Types: Cigarettes   Start date: 08/25/1971   Quit date: 08/25/2011   Years since quitting: 11.2  Smokeless Tobacco Never    Social History   Substance and Sexual Activity  Alcohol Use Not Currently    Family History  Problem Relation Age of Onset   Diabetes Mother        and brother    Kidney failure Mother     Heart disease Father        hx of MI   Breast cancer Sister    Colon cancer Neg Hx    Stomach cancer Neg Hx    Esophageal cancer Neg Hx    Rectal cancer Neg Hx     Reviw of Systems:  Reviewed in the HPI.  All other systems are negative.   Physical Exam: There were no vitals taken for this visit.  No BP recorded.  {Refresh Note OR Click here to enter BP  :1}***    GEN:  Well nourished, well developed in no acute distress HEENT: Normal NECK: No JVD; No carotid bruits LYMPHATICS: No lymphadenopathy CARDIAC: RRR ***, no murmurs, rubs, gallops RESPIRATORY:  Clear to auscultation without rales, wheezing or rhonchi  ABDOMEN: Soft, non-tender, non-distended MUSCULOSKELETAL:  No edema; No deformity  SKIN: Warm and dry NEUROLOGIC:  Alert and oriented x 3    ECG:       Assessment / Plan:   1. COVID :        2. CAD  -         3. Hyperlipidemia:           4.  Obstructive sleep apnea:         Return to see me in 1 year   Kristeen Miss, MD  11/14/2022 8:15 AM    [facility] [address] [place],  [address] [place], [place]  [address] Pager [phone] Phone: [phone]; Fax: [phone]

## 2022-11-16 ENCOUNTER — Encounter: Payer: Self-pay | Admitting: Cardiovascular Disease

## 2022-11-16 ENCOUNTER — Ambulatory Visit: Payer: Medicare Other | Admitting: Cardiovascular Disease

## 2022-11-16 ENCOUNTER — Other Ambulatory Visit: Payer: Self-pay | Admitting: Neurosurgery

## 2022-11-16 VITALS — BP 138/78 | HR 60 | Ht 70.0 in | Wt 185.4 lb

## 2022-11-16 DIAGNOSIS — Z01810 Encounter for preprocedural cardiovascular examination: Secondary | ICD-10-CM

## 2022-11-16 DIAGNOSIS — E782 Mixed hyperlipidemia: Secondary | ICD-10-CM

## 2022-11-16 DIAGNOSIS — I6523 Occlusion and stenosis of bilateral carotid arteries: Secondary | ICD-10-CM

## 2022-11-16 DIAGNOSIS — I2510 Atherosclerotic heart disease of native coronary artery without angina pectoris: Secondary | ICD-10-CM | POA: Diagnosis not present

## 2022-11-16 DIAGNOSIS — I10 Essential (primary) hypertension: Secondary | ICD-10-CM | POA: Diagnosis not present

## 2022-11-16 MED ORDER — SILDENAFIL CITRATE 100 MG PO TABS: ORAL_TABLET | ORAL | 11 refills | Status: AC

## 2022-11-16 NOTE — Patient Instructions (Signed)
Medication Instructions:  TAKE Sildeanfil 100mg  as needed  *If you need a refill on your cardiac medications before your next appointment, please call your pharmacy*   Lab Work: Lipids, ALT, BMET today If you have labs (blood work) drawn today and your tests are completely normal, you will receive your results only by: MyChart Message (if you have MyChart) OR A paper copy in the mail If you have any lab test that is abnormal or we need to change your treatment, we will call you to review the results.   Testing/Procedures: NONE-cleared for upcoming surgery   Follow-Up: At [facility], you and your health needs are our priority.  As part of our continuing mission to provide you with exceptional heart care, we have created designated Provider Care Teams.  These Care Teams include your primary Cardiologist (physician) and Advanced Practice Providers (APPs -  Physician Assistants and Nurse Practitioners) who all work together to provide you with the care you need, when you need it.  Your next appointment:   1 year(s)  Provider:   Kristeen Miss, MD

## 2022-11-18 NOTE — Pre-Procedure Instructions (Signed)
Surgical Instructions   Your procedure is scheduled on November 25, 2022. Report to [facility] Main Entrance "A" at 10:30 A.M., then check in with the Admitting office. Any questions or running late day of surgery: call [phone]  Questions prior to your surgery date: call [phone], Monday-Friday, 8am-4pm. If you experience any cold or flu symptoms such as cough, fever, chills, shortness of breath, etc. between now and your scheduled surgery, please notify us at the above number.     Remember:  Do not eat or drink after midnight the night before your surgery     Take these medicines the morning of surgery with A SIP OF WATER: ezetimibe (ZETIA)  fenofibrate (TRICOR)  metoprolol tartrate (LOPRESSOR)  omeprazole (PRILOSEC)  rosuvastatin (CRESTOR)    May take these medicines IF NEEDED: acetaminophen (TYLENOL)  albuterol (VENTOLIN HFA) inhaler  famotidine (PEPCID)  ipratropium (ATROVENT) nasal spray  nitroGLYCERIN (NITROSTAT) - please call [phone] if dose taken prior to surgery   Follow your surgeon's instructions on when to stop Aspirin.  If no instructions were given by your surgeon then you will need to call the office to get those instructions.     One week prior to surgery, STOP taking any Aleve, Naproxen, Ibuprofen, Motrin, Advil, Goody's, BC's, all herbal medications, fish oil, and non-prescription vitamins. This includes your medication: celecoxib (CELEBREX)    HOW TO MANAGE YOUR DIABETES BEFORE AND AFTER SURGERY  Why is it important to control my blood sugar before and after surgery? Improving blood sugar levels before and after surgery helps healing and can limit problems. A way of improving blood sugar control is eating a healthy diet by:  Eating less sugar and carbohydrates  Increasing activity/exercise  Talking with your doctor about reaching your blood sugar goals High blood sugars (greater than 180 mg/dL) can raise your risk of infections and slow  your recovery, so you will need to focus on controlling your diabetes during the weeks before surgery. Make sure that the doctor who takes care of your diabetes knows about your planned surgery including the date and location.  How do I manage my blood sugar before surgery? Check your blood sugar at least 4 times a day, starting 2 days before surgery, to make sure that the level is not too high or low.  Check your blood sugar the morning of your surgery when you wake up and every 2 hours until you get to the Short Stay unit.  If your blood sugar is less than 70 mg/dL, you will need to treat for low blood sugar: Do not take insulin. Treat a low blood sugar (less than 70 mg/dL) with  cup of clear juice (cranberry or apple), 4 glucose tablets, OR glucose gel. Recheck blood sugar in 15 minutes after treatment (to make sure it is greater than 70 mg/dL). If your blood sugar is not greater than 70 mg/dL on recheck, call [phone] for further instructions. Report your blood sugar to the short stay nurse when you get to Short Stay.  If you are admitted to the hospital after surgery: Your blood sugar will be checked by the staff and you will probably be given insulin after surgery (instead of oral diabetes medicines) to make sure you have good blood sugar levels. The goal for blood sugar control after surgery is 80-180 mg/dL.                      Do NOT Smoke (Tobacco/Vaping) for 24 hours prior to  your procedure.  If you use a CPAP at night, you may bring your mask/headgear for your overnight stay.   You will be asked to remove any contacts, glasses, piercing's, hearing aid's, dentures/partials prior to surgery. Please bring cases for these items if needed.    Patients discharged the day of surgery will not be allowed to drive home, and someone needs to stay with them for 24 hours.  SURGICAL WAITING ROOM VISITATION Patients may have no more than 2 support people in the waiting area - these  visitors may rotate.   Pre-op nurse will coordinate an appropriate time for 1 ADULT support person, who may not rotate, to accompany patient in pre-op.  Children under the age of 31 must have an adult with them who is not the patient and must remain in the main waiting area with an adult.  If the patient needs to stay at the hospital during part of their recovery, the visitor guidelines for inpatient rooms apply.  Please refer to the [facility] website for the visitor guidelines for any additional information.   If you received a COVID test during your pre-op visit  it is requested that you wear a mask when out in public, stay away from anyone that may not be feeling well and notify your surgeon if you develop symptoms. If you have been in contact with anyone that has tested positive in the last 10 days please notify you surgeon.      Pre-operative 5 CHG Bathing Instructions   You can play a key role in reducing the risk of infection after surgery. Your skin needs to be as free of germs as possible. You can reduce the number of germs on your skin by washing with CHG (chlorhexidine gluconate) soap before surgery. CHG is an antiseptic soap that kills germs and continues to kill germs even after washing.   DO NOT use if you have an allergy to chlorhexidine/CHG or antibacterial soaps. If your skin becomes reddened or irritated, stop using the CHG and notify one of our RNs at [phone].   Please shower with the CHG soap starting 4 days before surgery using the following schedule:     Please keep in mind the following:  DO NOT shave, including legs and underarms, starting the day of your first shower.   You may shave your face at any point before/day of surgery.  Place clean sheets on your bed the day you start using CHG soap. Use a clean washcloth (not used since being washed) for each shower. DO NOT sleep with pets once you start using the CHG.   CHG Shower Instructions:  If you  choose to wash your hair and private area, wash first with your normal shampoo/soap.  After you use shampoo/soap, rinse your hair and body thoroughly to remove shampoo/soap residue.  Turn the water OFF and apply about 3 tablespoons (45 ml) of CHG soap to a CLEAN washcloth.  Apply CHG soap ONLY FROM YOUR NECK DOWN TO YOUR TOES (washing for 3-5 minutes)  DO NOT use CHG soap on face, private areas, open wounds, or sores.  Pay special attention to the area where your surgery is being performed.  If you are having back surgery, having someone wash your back for you may be helpful. Wait 2 minutes after CHG soap is applied, then you may rinse off the CHG soap.  Pat dry with a clean towel  Put on clean clothes/pajamas   If you choose to wear lotion,  please use ONLY the CHG-compatible lotions on the back of this paper.   Additional instructions for the day of surgery: DO NOT APPLY any lotions, deodorants, cologne, or perfumes.   Do not bring valuables to the hospital. [facility] is not responsible for any belongings/valuables. Do not wear nail polish, gel polish, artificial nails, or any other type of covering on natural nails (fingers and toes) Do not wear jewelry or makeup Put on clean/comfortable clothes.  Please brush your teeth.  Ask your nurse before applying any prescription medications to the skin.     CHG Compatible Lotions   Aveeno Moisturizing lotion  Cetaphil Moisturizing Cream  Cetaphil Moisturizing Lotion  Clairol Herbal Essence Moisturizing Lotion, Dry Skin  Clairol Herbal Essence Moisturizing Lotion, Extra Dry Skin  Clairol Herbal Essence Moisturizing Lotion, Normal Skin  Curel Age Defying Therapeutic Moisturizing Lotion with Alpha Hydroxy  Curel Extreme Care Body Lotion  Curel Soothing Hands Moisturizing Hand Lotion  Curel Therapeutic Moisturizing Cream, Fragrance-Free  Curel Therapeutic Moisturizing Lotion, Fragrance-Free  Curel Therapeutic Moisturizing Lotion, Original  Formula  Eucerin Daily Replenishing Lotion  Eucerin Dry Skin Therapy Plus Alpha Hydroxy Crme  Eucerin Dry Skin Therapy Plus Alpha Hydroxy Lotion  Eucerin Original Crme  Eucerin Original Lotion  Eucerin Plus Crme Eucerin Plus Lotion  Eucerin TriLipid Replenishing Lotion  Keri Anti-Bacterial Hand Lotion  Keri Deep Conditioning Original Lotion Dry Skin Formula Softly Scented  Keri Deep Conditioning Original Lotion, Fragrance Free Sensitive Skin Formula  Keri Lotion Fast Absorbing Fragrance Free Sensitive Skin Formula  Keri Lotion Fast Absorbing Softly Scented Dry Skin Formula  Keri Original Lotion  Keri Skin Renewal Lotion Keri Silky Smooth Lotion  Keri Silky Smooth Sensitive Skin Lotion  Nivea Body Creamy Conditioning Oil  Nivea Body Extra Enriched Lotion  Nivea Body Original Lotion  Nivea Body Sheer Moisturizing Lotion Nivea Crme  Nivea Skin Firming Lotion  NutraDerm 30 Skin Lotion  NutraDerm Skin Lotion  NutraDerm Therapeutic Skin Cream  NutraDerm Therapeutic Skin Lotion  ProShield Protective Hand Cream  Provon moisturizing lotion  Please read over the following fact sheets that you were given.

## 2022-11-18 NOTE — Progress Notes (Signed)
PERIOPERATIVE PRESCRIPTION FOR IMPLANTED CARDIAC DEVICE PROGRAMMING  Patient Information: Name:  Daniel Carey  DOB:  14-Oct-1950  MRN:  [id number]    Planned Procedure:  Anterior Cervical Decompression/Discectomy Fusion, Cervical 3 - Cervical 4  Surgeon:  Dr. Hoyt Koch  Date of Procedure:  11/25/2022  Cautery will be used.  Position during surgery:  Supine   Please send documentation back to:  [facility] (Fax # [phone])  Device Information:  Clinic EP Physician:  Lewayne Bunting, MD   Device Type:  Pacemaker Manufacturer and Phone #:  St. Jude/Abbott: [phone] Pacemaker Dependent?:  Yes.   Date of Last Device Check:  10/01/22 Normal Device Function?:  Yes.    Electrophysiologist's Recommendations:  Have magnet available. Provide continuous ECG monitoring when magnet is used or reprogramming is to be performed.  Procedure may interfere with device function.  Magnet should be placed over device during procedure.  Per Device Clinic Standing Orders, Dorathy Daft, RN  5:13 PM 11/18/2022

## 2022-11-19 ENCOUNTER — Other Ambulatory Visit: Payer: Self-pay

## 2022-11-19 ENCOUNTER — Encounter (HOSPITAL_COMMUNITY): Payer: Self-pay

## 2022-11-19 ENCOUNTER — Encounter (HOSPITAL_COMMUNITY)
Admission: RE | Admit: 2022-11-19 | Discharge: 2022-11-19 | Disposition: A | Discharge status: Home / Self Care | Payer: Medicare Other | Source: Ambulatory Visit | Attending: Neurosurgery | Admitting: Neurosurgery

## 2022-11-19 VITALS — BP 143/77 | HR 66 | Temp 97.9°F | Resp 17 | Ht 70.0 in | Wt 186.7 lb

## 2022-11-19 DIAGNOSIS — Z01812 Encounter for preprocedural laboratory examination: Secondary | ICD-10-CM | POA: Insufficient documentation

## 2022-11-19 DIAGNOSIS — I2510 Atherosclerotic heart disease of native coronary artery without angina pectoris: Secondary | ICD-10-CM | POA: Insufficient documentation

## 2022-11-19 DIAGNOSIS — E119 Type 2 diabetes mellitus without complications: Secondary | ICD-10-CM | POA: Insufficient documentation

## 2022-11-19 DIAGNOSIS — Z951 Presence of aortocoronary bypass graft: Secondary | ICD-10-CM | POA: Insufficient documentation

## 2022-11-19 DIAGNOSIS — Z950 Presence of cardiac pacemaker: Secondary | ICD-10-CM | POA: Insufficient documentation

## 2022-11-19 DIAGNOSIS — G4733 Obstructive sleep apnea (adult) (pediatric): Secondary | ICD-10-CM | POA: Diagnosis not present

## 2022-11-19 DIAGNOSIS — Z7982 Long term (current) use of aspirin: Secondary | ICD-10-CM | POA: Insufficient documentation

## 2022-11-19 DIAGNOSIS — Z01818 Encounter for other preprocedural examination: Secondary | ICD-10-CM

## 2022-11-19 HISTORY — DX: Pneumonia, unspecified organism: J18.9

## 2022-11-19 LAB — CBC
HCT: 41.6 % (ref 39.0–52.0)
Hemoglobin: 13.8 g/dL (ref 13.0–17.0)
MCH: 30 pg (ref 26.0–34.0)
MCHC: 33.2 g/dL (ref 30.0–36.0)
MCV: 90.4 fL (ref 80.0–100.0)
Platelets: 214 10*3/uL (ref 150–400)
RBC: 4.6 MIL/uL (ref 4.22–5.81)
RDW: 13.1 % (ref 11.5–15.5)
WBC: 7 10*3/uL (ref 4.0–10.5)
nRBC: 0 % (ref 0.0–0.2)

## 2022-11-19 LAB — TYPE AND SCREEN
ABO/RH(D): O POS
Antibody Screen: NEGATIVE

## 2022-11-19 LAB — HEMOGLOBIN A1C
Hgb A1c MFr Bld: 7.4 % — ABNORMAL HIGH (ref 4.8–5.6)
Mean Plasma Glucose: 165.68 mg/dL

## 2022-11-19 LAB — GLUCOSE, CAPILLARY: Glucose-Capillary: 111 mg/dL — ABNORMAL HIGH (ref 70–99)

## 2022-11-19 LAB — SURGICAL PCR SCREEN
MRSA, PCR: NEGATIVE
Staphylococcus aureus: NEGATIVE

## 2022-11-19 NOTE — Progress Notes (Signed)
PCP - Dr. Jacquiline Doe Cardiologist - Dr. Kristeen Miss (Last office visit 11/16/2022 with 1 year f/u) Electrophysiologist - Lewayne Bunting (Last office visit 01/20/2022 with 1 year f/u) Endocrinologist - Dr. Carlus Pavlov  PPM/ICD - St. Jude/Abbott PPM Device Orders - Device orders received and placed in chart Rep Notified - Rep notified of sx, date and time on July 31st.  Chest x-ray - Denies EKG - 11/16/2022 Stress Test - 10/25/2012 ECHO - 07/18/2020 Cardiac Cath - 07/12/2020 with stents placed  Sleep Study - +OSA. Pt wears CPAP nightly. Pressure setting 10  Pt denies DM2 although A1c February 2024 was 6.8 and he was set up with an Endocrinologist. CBG at pre-op appointment 111. Pt does have a meter at home and checks it 5-6 times per month. He said MD tells him his cholesterol medicine can potentially make blood sugar levels high. When he does check a fasting sugar, its around 140s. A1c collect and result pending.  Last dose of GLP1 agonist- n/a GLP1 instructions: n/a  Blood Thinner Instructions: n/a Aspirin Instructions: Pt last dose of ASA July 30th  NPO after midnight  COVID TEST- n/a   Anesthesia review: Yes. Pacemaker, MI with stents, HTN, DM2.   Patient denies shortness of breath, fever, cough and chest pain at PAT appointment. Pt denies any respiratory illness/infection in the last two months.   All instructions explained to the patient, with a verbal understanding of the material. Patient agrees to go over the instructions while at home for a better understanding. Patient also instructed to self quarantine after being tested for COVID-19. The opportunity to ask questions was provided.

## 2022-11-20 NOTE — Progress Notes (Signed)
Anesthesia Chart Review:  Follows with cardiology for history of CAD s/p DES x 2 to RCA in 2014 and CABG x 3 in 2022, OSA on CPAP, CHB s/p PPM.  Last seen by Dr. Elease Hashimoto on 11/16/2022.  Per note, "Daniel Carey is seen for follow up of his CAD S/p CABG in 2022. Needs to have neck surgery next week . [place] . Has a bulging disc. No CP, no recent NTG. Is on ASA 81. He needs to stop his ASA 7 days prior to his neck surgery. He is at low risk for his upcoming neck surgery. He has been cleared."  Diet controlled DM2, A1c 7.4 on preop labs.  Preop labs reviewed, creatinine mildly elevated 1.31, otherwise unremarkable.  EKG 11/16/2022: AV dual-paced rhythm.  Rate 60.  Preoperative prescription for implanted cardiac device programming per progress note 11/16/2022: Device Information:   Clinic EP Physician:  Lewayne Bunting, MD    Device Type:  Pacemaker Manufacturer and Phone #:  St. Jude/Abbott: [phone] Pacemaker Dependent?:  Yes.   Date of Last Device Check:  10/01/22           Normal Device Function?:  Yes.     Electrophysiologist's Recommendations:   Have magnet available. Provide continuous ECG monitoring when magnet is used or reprogramming is to be performed.  Procedure may interfere with device function.  Magnet should be placed over device during procedure.  Carotid duplex 09/23/2022: Summary:  Right Carotid: Velocities in the right ICA are consistent with a 40-59% stenosis.  Left Carotid: Velocities in the left ICA are consistent with a 40-59% stenosis.  Hemodynamically significant plaque >50% visualized in the distal CCA.  Vertebrals:  Bilateral vertebral arteries demonstrate antegrade flow.  Subclavians: Normal flow hemodynamics were seen in bilateral subclavian arteries.   TEE 07/19/2020 (IntraOp, CABG): - Left Ventricle: The left ventricle is unchanged from pre-bypass.  - Right Ventricle: The right ventricle appears unchanged from pre-bypass.  -  Aorta: The aorta appears unchanged from pre-bypass.  - Left Atrium: The left atrium appears unchanged from pre-bypass.  - Left Atrial Appendage: The left atrial appendage appears unchanged from  pre-bypass.  - Aortic Valve: The aortic valve appears unchanged from pre-bypass.  - Mitral Valve: There is trace regurgitation  - Tricuspid Valve: The tricuspid valve appears unchanged from pre-bypass.  - Pulmonic Valve: The pulmonic valve appears unchanged from pre-bypass.  - Interatrial Septum: The interatrial septum appears unchanged from  pre-bypass.  - Interventricular Septum: The interventricular septum appears unchanged  from  pre-bypass.  - Pericardium: The pericardium appears unchanged from pre-bypass.   PRE-OP FINDINGS   Left Ventricle: The left ventricle has low normal systolic function, with  an ejection fraction of 50-55%. The cavity size was normal. There is no  increase in left ventricular wall thickness.   Right Ventricle: The right ventricle has normal systolic function. The  cavity was normal. There is no increase in right ventricular wall  thickness.   Left Atrium: Left atrial size was normal in size. No left atrial/left  atrial appendage thrombus was detected.   Right Atrium: Right atrial size was normal in size.   Interatrial Septum: No atrial level shunt detected by color flow Doppler.   Pericardium: There is no evidence of pericardial effusion.   Mitral Valve: The mitral valve is normal in structure. Mitral valve  regurgitation is not visualized by color flow Doppler.   Tricuspid Valve: The tricuspid valve was normal in structure.  Tricuspid  valve regurgitation was not visualized by color flow Doppler.   Aortic Valve: The aortic valve is normal in structure. Aortic valve  regurgitation was not visualized by color flow Doppler. There is no  stenosis of the aortic valve.   Pulmonic Valve: The pulmonic valve was normal in structure. Pulmonic valve  regurgitation  is trivial by color flow Doppler.    Aorta: The aortic root, ascending aorta and aortic arch are normal in size  and structure. There is evidence of plaque in the descending aorta.     Zannie Cove [facility] Short Stay Center/Anesthesiology Phone [phone] 11/20/2022 11:35 AM

## 2022-11-20 NOTE — Anesthesia Preprocedure Evaluation (Addendum)
Anesthesia Evaluation  Patient identified by MRN, date of birth, ID band Patient awake    Reviewed: Allergy & Precautions, H&P , NPO status , Patient's Chart, lab work & pertinent test results  Airway Mallampati: II  TM Distance: >3 FB Neck ROM: Full    Dental no notable dental hx.    Pulmonary sleep apnea , COPD, former smoker   breath sounds clear to auscultation + decreased breath sounds      Cardiovascular hypertension, + CAD and + Past MI  Normal cardiovascular exam Rhythm:Regular Rate:Normal     Neuro/Psych negative neurological ROS  negative psych ROS   GI/Hepatic Neg liver ROS,GERD  ,,  Endo/Other  diabetes    Renal/GU negative Renal ROS  negative genitourinary   Musculoskeletal negative musculoskeletal ROS (+)    Abdominal   Peds negative pediatric ROS (+)  Hematology negative hematology ROS (+)   Anesthesia Other Findings   Reproductive/Obstetrics negative OB ROS                             Anesthesia Physical Anesthesia Plan  ASA: 3  Anesthesia Plan: General   Post-op Pain Management: Ofirmev IV (intra-op)*   Induction: Intravenous  PONV Risk Score and Plan: 2 and Ondansetron, Dexamethasone and Treatment may vary due to age or medical condition  Airway Management Planned: Oral ETT  Additional Equipment:   Intra-op Plan:   Post-operative Plan: Extubation in OR  Informed Consent: I have reviewed the patients History and Physical, chart, labs and discussed the procedure including the risks, benefits and alternatives for the proposed anesthesia with the patient or authorized representative who has indicated his/her understanding and acceptance.     Dental advisory given  Plan Discussed with: CRNA and Surgeon  Anesthesia Plan Comments: (PAT note by Antionette Poles, PA-C: Follows with cardiology for history of CAD s/p DES x 2 to RCA in 2014 and CABG x 3 in 2022, OSA  on CPAP, CHB s/p PPM.  Last seen by Dr. Elease Hashimoto on 11/16/2022.  Per note, "Matis is seen for follow up of his CAD S/p CABG in 2022. Needs to have neck surgery next week . [place] the [place] working [place] . Has a bulging disc. No CP, no recent NTG. Is on ASA 81. He needs to stop his ASA 7 days prior to his neck surgery. He is at low risk for his upcoming neck surgery. He has been cleared."  Diet controlled DM2, A1c 7.4 on preop labs.  Preop labs reviewed, creatinine mildly elevated 1.31, otherwise unremarkable.  EKG 11/16/2022: AV dual-paced rhythm.  Rate 60.  Preoperative prescription for implanted cardiac device programming per progress note 11/16/2022: Device Information:  Clinic EP Physician:  Lewayne Bunting, MD   Device Type:  Pacemaker Manufacturer and Phone #:  St. Jude/Abbott: [phone] Pacemaker Dependent?:  Yes.   Date of Last Device Check:  10/01/22           Normal Device Function?:  Yes.    Electrophysiologist's Recommendations:   Have magnet available.  Provide continuous ECG monitoring when magnet is used or reprogramming is to be performed.   Procedure may interfere with device function.  Magnet should be placed over device during procedure.  Carotid duplex 09/23/2022: Summary:  Right Carotid: Velocities in the right ICA are consistent with a 40-59% stenosis.  Left Carotid: Velocities in the left ICA are consistent with a 40-59% stenosis. Hemodynamically significant plaque >50%  visualized in the distalCCA.  Vertebrals: Bilateral vertebral arteries demonstrate antegrade flow.  Subclavians: Normal flow hemodynamics were seen in bilateral subclavian arteries.   TEE 07/19/2020 (IntraOp, CABG): - Left Ventricle: The left ventricle is unchanged from pre-bypass.  - Right Ventricle: The right ventricle appears unchanged from pre-bypass.  - Aorta: The aorta appears unchanged from pre-bypass.  - Left Atrium: The left atrium appears unchanged from pre-bypass.   - Left Atrial Appendage: The left atrial appendage appears unchanged from  pre-bypass.  - Aortic Valve: The aortic valve appears unchanged from pre-bypass.  - Mitral Valve: There is trace regurgitation  - Tricuspid Valve: The tricuspid valve appears unchanged from pre-bypass.  - Pulmonic Valve: The pulmonic valve appears unchanged from pre-bypass.  - Interatrial Septum: The interatrial septum appears unchanged from  pre-bypass.  - Interventricular Septum: The interventricular septum appears unchanged  from  pre-bypass.  - Pericardium: The pericardium appears unchanged from pre-bypass.   PRE-OP FINDINGS  Left Ventricle: The left ventricle has low normal systolic function, with  an ejection fraction of 50-55%. The cavity size was normal. There is no  increase in left ventricular wall thickness.   Right Ventricle: The right ventricle has normal systolic function. The  cavity was normal. There is no increase in right ventricular wall  thickness.   Left Atrium: Left atrial size was normal in size. No left atrial/left  atrial appendage thrombus was detected.   Right Atrium: Right atrial size was normal in size.   Interatrial Septum: No atrial level shunt detected by color flow Doppler.   Pericardium: There is no evidence of pericardial effusion.   Mitral Valve: The mitral valve is normal in structure. Mitral valve  regurgitation is not visualized by color flow Doppler.   Tricuspid Valve: The tricuspid valve was normal in structure. Tricuspid  valve regurgitation was not visualized by color flow Doppler.   Aortic Valve: The aortic valve is normal in structure. Aortic valve  regurgitation was not visualized by color flow Doppler. There is no  stenosis of the aortic valve.   Pulmonic Valve: The pulmonic valve was normal in structure. Pulmonic valve  regurgitation is trivial by color flow Doppler.    Aorta: The aortic root, ascending aorta and aortic arch are normal in size   and structure. There is evidence of plaque in the descending aorta.    )        Anesthesia Quick Evaluation

## 2022-11-25 ENCOUNTER — Ambulatory Visit (HOSPITAL_COMMUNITY): Payer: Medicare Other

## 2022-11-25 ENCOUNTER — Ambulatory Visit (HOSPITAL_BASED_OUTPATIENT_CLINIC_OR_DEPARTMENT_OTHER): Payer: Medicare Other

## 2022-11-25 ENCOUNTER — Other Ambulatory Visit: Payer: Self-pay

## 2022-11-25 ENCOUNTER — Encounter (HOSPITAL_COMMUNITY): Payer: Self-pay

## 2022-11-25 ENCOUNTER — Ambulatory Visit (HOSPITAL_COMMUNITY): Payer: Medicare Other | Admitting: Physician Assistant

## 2022-11-25 ENCOUNTER — Ambulatory Visit (HOSPITAL_COMMUNITY)
Admission: RE | Admit: 2022-11-25 | Discharge: 2022-11-25 | Disposition: A | Discharge status: Home / Self Care | Payer: Medicare Other | Attending: Neurosurgery | Admitting: Neurosurgery

## 2022-11-25 ENCOUNTER — Encounter (HOSPITAL_COMMUNITY)
Admission: RE | Disposition: A | Discharge status: Home / Self Care | Payer: Self-pay | Source: Home / Self Care | Attending: Neurosurgery

## 2022-11-25 DIAGNOSIS — E119 Type 2 diabetes mellitus without complications: Secondary | ICD-10-CM

## 2022-11-25 DIAGNOSIS — M5412 Radiculopathy, cervical region: Secondary | ICD-10-CM

## 2022-11-25 DIAGNOSIS — E785 Hyperlipidemia, unspecified: Secondary | ICD-10-CM | POA: Diagnosis not present

## 2022-11-25 DIAGNOSIS — G4733 Obstructive sleep apnea (adult) (pediatric): Secondary | ICD-10-CM | POA: Diagnosis not present

## 2022-11-25 DIAGNOSIS — M4802 Spinal stenosis, cervical region: Secondary | ICD-10-CM | POA: Diagnosis not present

## 2022-11-25 DIAGNOSIS — Z981 Arthrodesis status: Secondary | ICD-10-CM | POA: Diagnosis not present

## 2022-11-25 HISTORY — PX: ANTERIOR CERVICAL DECOMP/DISCECTOMY FUSION: SHX1161

## 2022-11-25 LAB — GLUCOSE, CAPILLARY
Glucose-Capillary: 132 mg/dL — ABNORMAL HIGH (ref 70–99)
Glucose-Capillary: 116 mg/dL — ABNORMAL HIGH (ref 70–99)
Glucose-Capillary: 118 mg/dL — ABNORMAL HIGH (ref 70–99)

## 2022-11-25 SURGERY — ANTERIOR CERVICAL DECOMPRESSION/DISCECTOMY FUSION 1 LEVEL
Anesthesia: General

## 2022-11-25 MED ORDER — CHLORHEXIDINE GLUCONATE CLOTH 2 % EX PADS: 6.0000 | MEDICATED_PAD | Freq: Once | CUTANEOUS | Status: DC

## 2022-11-25 MED ORDER — INSULIN ASPART 100 UNIT/ML IJ SOLN: 0.0000 [IU] | INTRAMUSCULAR | Status: DC | PRN

## 2022-11-25 MED ORDER — SUGAMMADEX SODIUM 200 MG/2ML IV SOLN
INTRAVENOUS | Status: DC | PRN
  Administered 2022-11-25: 200 mg via INTRAVENOUS

## 2022-11-25 MED ORDER — ORAL CARE MOUTH RINSE: 15.0000 mL | Freq: Once | OROMUCOSAL | Status: AC

## 2022-11-25 MED ORDER — ACETAMINOPHEN 500 MG PO TABS: 1000.0000 mg | ORAL_TABLET | Freq: Once | ORAL | Status: DC | PRN

## 2022-11-25 MED ORDER — OXYCODONE HCL 5 MG/5ML PO SOLN: 5.0000 mg | Freq: Once | ORAL | Status: AC | PRN

## 2022-11-25 MED ORDER — 0.9 % SODIUM CHLORIDE (POUR BTL) OPTIME
TOPICAL | Status: DC | PRN
  Administered 2022-11-25: 1000 mL

## 2022-11-25 MED ORDER — ACETAMINOPHEN 10 MG/ML IV SOLN
INTRAVENOUS | Status: DC | PRN
  Administered 2022-11-25: 1000 mg via INTRAVENOUS

## 2022-11-25 MED ORDER — CHLORHEXIDINE GLUCONATE 0.12 % MT SOLN
15.0000 mL | Freq: Once | OROMUCOSAL | Status: AC
  Administered 2022-11-25: 15 mL via OROMUCOSAL
  Filled 2022-11-25: qty 15

## 2022-11-25 MED ORDER — DEXAMETHASONE SODIUM PHOSPHATE 10 MG/ML IJ SOLN
INTRAMUSCULAR | Status: DC | PRN
  Administered 2022-11-25: 10 mg via INTRAVENOUS

## 2022-11-25 MED ORDER — PROPOFOL 10 MG/ML IV BOLUS
INTRAVENOUS | Status: DC | PRN
  Administered 2022-11-25: 150 mg via INTRAVENOUS

## 2022-11-25 MED ORDER — FENTANYL CITRATE (PF) 100 MCG/2ML IJ SOLN
25.0000 ug | INTRAMUSCULAR | Status: DC | PRN
  Administered 2022-11-25: 50 ug via INTRAVENOUS

## 2022-11-25 MED ORDER — ONDANSETRON HCL 4 MG/2ML IJ SOLN
INTRAMUSCULAR | Status: AC
  Filled 2022-11-25: qty 2

## 2022-11-25 MED ORDER — LIDOCAINE 2% (20 MG/ML) 5 ML SYRINGE
INTRAMUSCULAR | Status: DC | PRN
  Administered 2022-11-25: 100 mg via INTRAVENOUS

## 2022-11-25 MED ORDER — OXYCODONE HCL 5 MG PO TABS
5.0000 mg | ORAL_TABLET | Freq: Once | ORAL | Status: AC | PRN
  Administered 2022-11-25: 5 mg via ORAL

## 2022-11-25 MED ORDER — OXYCODONE HCL 5 MG PO TABS
ORAL_TABLET | ORAL | Status: AC
  Filled 2022-11-25: qty 1

## 2022-11-25 MED ORDER — THROMBIN 5000 UNITS EX SOLR
OROMUCOSAL | Status: DC | PRN
  Administered 2022-11-25: 5 mL via TOPICAL

## 2022-11-25 MED ORDER — ONDANSETRON HCL 4 MG/2ML IJ SOLN
INTRAMUSCULAR | Status: DC | PRN
Start: 2022-11-25 — End: 2022-11-25
  Administered 2022-11-25: 4 mg via INTRAVENOUS

## 2022-11-25 MED ORDER — LIDOCAINE-EPINEPHRINE 1 %-1:100000 IJ SOLN
INTRAMUSCULAR | Status: DC | PRN
  Administered 2022-11-25: 6 mL

## 2022-11-25 MED ORDER — DEXAMETHASONE SODIUM PHOSPHATE 10 MG/ML IJ SOLN
INTRAMUSCULAR | Status: AC
  Filled 2022-11-25: qty 1

## 2022-11-25 MED ORDER — ACETAMINOPHEN 10 MG/ML IV SOLN
INTRAVENOUS | Status: AC
  Filled 2022-11-25: qty 100

## 2022-11-25 MED ORDER — ACETAMINOPHEN 10 MG/ML IV SOLN: 1000.0000 mg | Freq: Once | INTRAVENOUS | Status: DC | PRN

## 2022-11-25 MED ORDER — PHENYLEPHRINE HCL-NACL 20-0.9 MG/250ML-% IV SOLN
INTRAVENOUS | Status: DC | PRN
  Administered 2022-11-25: 25 ug/min via INTRAVENOUS

## 2022-11-25 MED ORDER — ACETAMINOPHEN 160 MG/5ML PO SOLN: 1000.0000 mg | Freq: Once | ORAL | Status: DC | PRN

## 2022-11-25 MED ORDER — PHENYLEPHRINE 80 MCG/ML (10ML) SYRINGE FOR IV PUSH (FOR BLOOD PRESSURE SUPPORT)
PREFILLED_SYRINGE | INTRAVENOUS | Status: DC | PRN
  Administered 2022-11-25: 160 ug via INTRAVENOUS
  Administered 2022-11-25: 80 ug via INTRAVENOUS

## 2022-11-25 MED ORDER — FENTANYL CITRATE (PF) 250 MCG/5ML IJ SOLN
INTRAMUSCULAR | Status: DC | PRN
  Administered 2022-11-25: 100 ug via INTRAVENOUS
  Administered 2022-11-25 (×3): 50 ug via INTRAVENOUS

## 2022-11-25 MED ORDER — THROMBIN 5000 UNITS EX SOLR
CUTANEOUS | Status: AC
  Filled 2022-11-25: qty 5000

## 2022-11-25 MED ORDER — HYDROCODONE-ACETAMINOPHEN 5-325 MG PO TABS: 1.0000 | ORAL_TABLET | ORAL | 0 refills | Status: DC | PRN

## 2022-11-25 MED ORDER — LIDOCAINE-EPINEPHRINE 1 %-1:100000 IJ SOLN
INTRAMUSCULAR | Status: AC
  Filled 2022-11-25: qty 1

## 2022-11-25 MED ORDER — LACTATED RINGERS IV SOLN: INTRAVENOUS | Status: DC

## 2022-11-25 MED ORDER — FENTANYL CITRATE (PF) 250 MCG/5ML IJ SOLN
INTRAMUSCULAR | Status: AC
  Filled 2022-11-25: qty 5

## 2022-11-25 MED ORDER — ROCURONIUM BROMIDE 10 MG/ML (PF) SYRINGE
PREFILLED_SYRINGE | INTRAVENOUS | Status: AC
  Filled 2022-11-25: qty 10

## 2022-11-25 MED ORDER — SODIUM CHLORIDE 0.9 % IV SOLN: INTRAVENOUS | Status: DC | PRN

## 2022-11-25 MED ORDER — ROCURONIUM BROMIDE 10 MG/ML (PF) SYRINGE
PREFILLED_SYRINGE | INTRAVENOUS | Status: DC | PRN
  Administered 2022-11-25: 70 mg via INTRAVENOUS
  Administered 2022-11-25: 30 mg via INTRAVENOUS

## 2022-11-25 MED ORDER — FENTANYL CITRATE (PF) 100 MCG/2ML IJ SOLN
INTRAMUSCULAR | Status: AC
  Filled 2022-11-25: qty 2

## 2022-11-25 MED ORDER — LIDOCAINE 2% (20 MG/ML) 5 ML SYRINGE
INTRAMUSCULAR | Status: AC
  Filled 2022-11-25: qty 5

## 2022-11-25 MED ORDER — METHOCARBAMOL 750 MG PO TABS: 750.0000 mg | ORAL_TABLET | Freq: Three times a day (TID) | ORAL | 0 refills | Status: DC | PRN

## 2022-11-25 MED ORDER — CEFAZOLIN SODIUM-DEXTROSE 2-4 GM/100ML-% IV SOLN
2.0000 g | INTRAVENOUS | Status: AC
  Administered 2022-11-25: 2 g via INTRAVENOUS
  Filled 2022-11-25: qty 100

## 2022-11-25 SURGICAL SUPPLY — 64 items
ADH SKN CLS APL DERMABOND .7 (GAUZE/BANDAGES/DRESSINGS) ×1
APL SKNCLS STERI-STRIP NONHPOA (GAUZE/BANDAGES/DRESSINGS) ×1
BAG COUNTER SPONGE SURGICOUNT (BAG) ×2 IMPLANT
BAG SPNG CNTER NS LX DISP (BAG) ×1
BASKET BONE COLLECTION (BASKET) IMPLANT
BENZOIN TINCTURE PRP APPL 2/3 (GAUZE/BANDAGES/DRESSINGS) ×2 IMPLANT
BIT DRILL 13 (BIT) IMPLANT
BIT DRILL NEURO 2X3.1 SFT TUCH (MISCELLANEOUS) ×2 IMPLANT
BLADE CLIPPER SURG (BLADE) IMPLANT
BUR MATCHSTICK NEURO 3.0 LAGG (BURR) ×2 IMPLANT
CANISTER SUCT 3000ML PPV (MISCELLANEOUS) ×2 IMPLANT
DERMABOND ADVANCED .7 DNX12 (GAUZE/BANDAGES/DRESSINGS) IMPLANT
DEVICE ENDSKLTN IMPL 16X14X7X6 (Cage) IMPLANT
DRAPE C-ARM 42X72 X-RAY (DRAPES) ×4 IMPLANT
DRAPE LAPAROTOMY 100X72 PEDS (DRAPES) ×2 IMPLANT
DRAPE MICROSCOPE SLANT 54X150 (MISCELLANEOUS) ×2 IMPLANT
DRAPE SHEET LG 3/4 BI-LAMINATE (DRAPES) ×2 IMPLANT
DRESSING MEPILEX FLEX 4X4 (GAUZE/BANDAGES/DRESSINGS) IMPLANT
DRILL NEURO 2X3.1 SOFT TOUCH (MISCELLANEOUS) ×1
DRSG MEPILEX FLEX 4X4 (GAUZE/BANDAGES/DRESSINGS)
DRSG OPSITE 4X5.5 SM (GAUZE/BANDAGES/DRESSINGS) ×4 IMPLANT
DRSG OPSITE POSTOP 3X4 (GAUZE/BANDAGES/DRESSINGS) ×2 IMPLANT
DRSG TELFA 3X8 NADH STRL (GAUZE/BANDAGES/DRESSINGS) IMPLANT
DURAPREP 26ML APPLICATOR (WOUND CARE) ×2 IMPLANT
ELECT COATED BLADE 2.86 ST (ELECTRODE) ×2 IMPLANT
ELECT REM PT RETURN 9FT ADLT (ELECTROSURGICAL) ×1
ELECTRODE REM PT RTRN 9FT ADLT (ELECTROSURGICAL) ×2 IMPLANT
ENDOSKELETON IMPLANT 16X14X7X6 (Cage) ×1 IMPLANT
GAUZE 4X4 16PLY ~~LOC~~+RFID DBL (SPONGE) IMPLANT
GLOVE BIO SURGEON STRL SZ7 (GLOVE) ×4 IMPLANT
GLOVE BIOGEL PI IND STRL 7.5 (GLOVE) ×4 IMPLANT
GLOVE ECLIPSE 7.5 STRL STRAW (GLOVE) ×2 IMPLANT
GOWN STRL REUS W/ TWL LRG LVL3 (GOWN DISPOSABLE) ×4 IMPLANT
GOWN STRL REUS W/ TWL XL LVL3 (GOWN DISPOSABLE) ×4 IMPLANT
GOWN STRL REUS W/TWL 2XL LVL3 (GOWN DISPOSABLE) IMPLANT
GOWN STRL REUS W/TWL LRG LVL3 (GOWN DISPOSABLE) ×2
GOWN STRL REUS W/TWL XL LVL3 (GOWN DISPOSABLE) ×2
HEMOSTAT POWDER KIT SURGIFOAM (HEMOSTASIS) ×2 IMPLANT
KIT BASIN OR (CUSTOM PROCEDURE TRAY) ×2 IMPLANT
KIT TURNOVER KIT B (KITS) ×2 IMPLANT
NDL HYPO 22X1.5 SAFETY MO (MISCELLANEOUS) ×2 IMPLANT
NDL SPNL 22GX3.5 QUINCKE BK (NEEDLE) ×2 IMPLANT
NEEDLE HYPO 22X1.5 SAFETY MO (MISCELLANEOUS) ×1 IMPLANT
NEEDLE SPNL 22GX3.5 QUINCKE BK (NEEDLE) ×1 IMPLANT
NS IRRIG 1000ML POUR BTL (IV SOLUTION) ×2 IMPLANT
PACK LAMINECTOMY NEURO (CUSTOM PROCEDURE TRAY) ×2 IMPLANT
PAD ARMBOARD 7.5X6 YLW CONV (MISCELLANEOUS) ×6 IMPLANT
PATTIES SURGICAL .5 X3 (DISPOSABLE) ×2 IMPLANT
PIN DISTRACTION 14MM (PIN) ×4 IMPLANT
PLATE ZEVO 1LVL 19MM (Plate) IMPLANT
PUTTY DBF 1CC CORTICAL FIBERS (Putty) IMPLANT
SCREW VA SD 3.5X16 (Screw) IMPLANT
SPIKE FLUID TRANSFER (MISCELLANEOUS) ×2 IMPLANT
SPONGE INTESTINAL PEANUT (DISPOSABLE) ×2 IMPLANT
STAPLER VISISTAT 35W (STAPLE) IMPLANT
STRIP CLOSURE SKIN 1/2X4 (GAUZE/BANDAGES/DRESSINGS) ×2 IMPLANT
SUT MNCRL AB 4-0 PS2 18 (SUTURE) ×2 IMPLANT
SUT SILK 2 0 TIES 10X30 (SUTURE) IMPLANT
SUT VIC AB 3-0 SH 8-18 (SUTURE) ×2 IMPLANT
TAPE CLOTH 3X10 TAN LF (GAUZE/BANDAGES/DRESSINGS) ×2 IMPLANT
TIP KERRISON THIN FOOTPLATE 2M (MISCELLANEOUS) ×2 IMPLANT
TOWEL GREEN STERILE (TOWEL DISPOSABLE) ×2 IMPLANT
TOWEL GREEN STERILE FF (TOWEL DISPOSABLE) ×2 IMPLANT
WATER STERILE IRR 1000ML POUR (IV SOLUTION) ×2 IMPLANT

## 2022-11-25 NOTE — H&P (Signed)
CC: Neck pain  HPI:     Patient is a 72 y.o. male presents with left cervical radiculopathy refractory to medical and physical therapy.    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   Spinal stenosis of lumbar region 05/12/2018   Obstructive sleep apnea 04/26/2014   Heart block 07/28/2011   Diverticulosis of colon (without mention of hemorrhage) 07/08/2011   History of colonic polyps 12/23/2010   HIATAL HERNIA 12/22/2007   Hyperlipidemia 12/30/2006   Essential hypertension 12/30/2006   CAD (coronary artery disease) 12/30/2006   GERD 12/30/2006   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    noted on EKG March 2013   CAD (coronary artery disease)    s/p atherectomy, PTCA/DES x2 mid & distal RCA 06/30/12   Carotid artery occlusion    Chest pain    Complication of anesthesia    heart rate dropping with propofol last procedure   COPD (chronic obstructive pulmonary disease) (HCC)    Dyspnea    ED (erectile dysfunction)    Esophageal stricture    External hemorrhoids without mention of complication    GERD (gastroesophageal reflux disease)    Hiatal hernia    History of kidney stones    HLD (hyperlipidemia)    HTN (hypertension)    Myocardial infarction (HCC)    1996   Personal history of colonic polyps 03/12/2010   TUBULAR ADENOMA   Pneumonia    Presence of permanent cardiac pacemaker    Sleep apnea    wears CPAP   Tobacco abuse    Tubular adenoma of colon    Type 2 diabetes mellitus (HCC)     Past Surgical History:  Procedure Laterality Date   ANGIOPLASTY     X3   CARDIAC CATHETERIZATION     8 years ago stents   COLONOSCOPY N/A 02/16/2017   Procedure: COLONOSCOPY;  Surgeon: Beverley Fiedler, MD;   Location: [facility];  Service: Gastroenterology;  Laterality: N/A;   COLONOSCOPY     CORONARY ARTERY BYPASS GRAFT N/A 07/19/2020   Procedure: CORONARY ARTERY BYPASS GRAFTING (CABG) TIMES THREE USING LEFT INTERNAL MAMMARY ARTERY AND RIGHT GREATER SAPHENOUS VEIN HARVESTED ENDOSCOPICALLY;  Surgeon: Alleen Borne, MD;  Location: [facility] OR;  Service: Open Heart Surgery;  Laterality: N/A;   INSERT / REPLACE / REMOVE PACEMAKER  06/20/2018   LEFT HEART CATH AND CORONARY ANGIOGRAPHY N/A 11/05/2016   Procedure: Left Heart Cath and Coronary Angiography;  Surgeon: Yvonne Kendall, MD;  Location: [facility];  Service: Cardiovascular;  Laterality: N/A;   LEFT HEART CATH AND CORONARY ANGIOGRAPHY N/A 07/12/2020   Procedure: LEFT HEART CATH AND CORONARY ANGIOGRAPHY;  Surgeon: Kathleene Hazel, MD;  Location: [facility];  Service: Cardiovascular;  Laterality: N/A;   LUMBAR LAMINECTOMY/DECOMPRESSION MICRODISCECTOMY N/A 05/12/2018   Procedure: Microlumbar decompression L2-3, possible L1-2;  Surgeon: Jene Every, MD;  Location: [facility] OR;  Service: Orthopedics;  Laterality: N/A;  2 hrs, Microlumbar decompression L2-3, possible L1-2   PACEMAKER IMPLANT N/A 06/20/2018   Procedure: PACEMAKER IMPLANT;  Surgeon: Marinus Maw, MD;  Location: [facility];  Service: Cardiovascular;  Laterality: N/A;   PERCUTANEOUS  CORONARY ROTOBLATOR INTERVENTION (PCI-R) N/A 06/30/2012   Procedure: PERCUTANEOUS CORONARY ROTOBLATOR INTERVENTION (PCI-R);  Surgeon: Kathleene Hazel, MD;  Location: [facility] CATH [facility];  Service: Cardiovascular;  Laterality: N/A;   TEE WITHOUT CARDIOVERSION N/A 07/19/2020   Procedure: TRANSESOPHAGEAL ECHOCARDIOGRAM (TEE);  Surgeon: Alleen Borne, MD;  Location: [facility] OR;  Service: Open Heart Surgery;  Laterality: N/A;   VIDEO BRONCHOSCOPY  08/13/2011   Procedure: VIDEO BRONCHOSCOPY WITHOUT FLUORO;  Surgeon: Nyoka Cowden, MD;  Location: [facility];  Service: Cardiopulmonary;   Laterality: Bilateral;    Medications Prior to Admission  Medication Sig Dispense Refill Last Dose   acetaminophen (TYLENOL) 500 MG tablet Take 1,000 mg by mouth every 6 (six) hours as needed (pain).    11/24/2022   aspirin EC 81 MG tablet Take 81 mg by mouth daily. Swallow whole.   11/18/2022   celecoxib (CELEBREX) 200 MG capsule Take 200 mg by mouth daily as needed for mild pain (hip pain).   Past Month   ezetimibe (ZETIA) 10 MG tablet Take 1 tablet (10 mg total) by mouth daily. 90 tablet 3 11/25/2022 at 0815   fenofibrate (TRICOR) 145 MG tablet Take 1 tablet (145 mg total) by mouth daily. 90 tablet 3 11/24/2022   hydrochlorothiazide (HYDRODIURIL) 25 MG tablet TAKE 1/2 TABLET (12.5 MG TOTAL) BY MOUTH DAILY AS NEEDED. 90 tablet 1 11/24/2022   ipratropium (ATROVENT) 0.06 % nasal spray PLACE 2 SPRAYS INTO BOTH NOSTRILS 4 (FOUR) TIMES DAILY AS DIRECTED (Patient taking differently: Place 2 sprays into both nostrils 4 (four) times daily as needed for rhinitis.) 13 mL 11    metoprolol tartrate (LOPRESSOR) 25 MG tablet TAKE 1/2 TABLET TWICE A DAY BY MOUTH 90 tablet 3 11/25/2022 at 0815   nitroGLYCERIN (NITROSTAT) 0.4 MG SL tablet Place 1 tablet (0.4 mg total) under the tongue every 5 (five) minutes as needed for chest pain. 25 tablet 6    omeprazole (PRILOSEC) 40 MG capsule Take 1 capsule (40 mg total) by mouth daily. (Patient taking differently: Take 40 mg by mouth daily as needed (acid reflux).) 90 capsule 3 11/24/2022   rosuvastatin (CRESTOR) 20 MG tablet TAKE 1 TABLET BY MOUTH EVERY DAY 90 tablet 3 Past Week   albuterol (VENTOLIN HFA) 108 (90 Base) MCG/ACT inhaler INHALE 2 PUFFS INTO THE LUNGS EVERY 6 HOURS AS NEEDED FOR WHEEZE OR SHORTNESS OF BREATH 8.5 each 3 More than a month   Blood Glucose Calibration (ONETOUCH VERIO) High LIQD       famotidine (PEPCID) 20 MG tablet Take 20 mg by mouth daily as needed for heartburn.   More than a month   ONETOUCH VERIO test strip 1 each daily.      Pharmacist Choice Lancets  MISC       sildenafil (VIAGRA) 100 MG tablet Take 1 tablet by mouth prior to sexual activity as needed 15 tablet 11 Unknown   Allergies  Allergen Reactions   Amlodipine Besylate Other (See Comments)    Headache, weakness, dizziness   Propofol Other (See Comments)    Heart rate dropped     Lisinopril Cough and Rash   Rosuvastatin Other (See Comments)     Corncern for muscle aches but he is tolerating now     Social History   Tobacco Use   Smoking status: Former    Current packs/day: 0.00    Average packs/day: 0.5 packs/day for 40.0 years (20.0 ttl pk-yrs)    Types: Cigarettes    Start date: 08/25/1971  Quit date: 08/25/2011    Years since quitting: 11.2   Smokeless tobacco: Never  Substance Use Topics   Alcohol use: Not Currently    Family History  Problem Relation Age of Onset   Diabetes Mother        and brother    Kidney failure Mother    Heart disease Father        hx of MI   Breast cancer Sister    Colon cancer Neg Hx    Stomach cancer Neg Hx    Esophageal cancer Neg Hx    Rectal cancer Neg Hx      Review of Systems Pertinent items are noted in HPI.  Objective:   Patient Vitals for the past 8 hrs:  BP Temp Pulse Resp SpO2 Height Weight  11/25/22 1041 (!) 149/81 97.6 F (36.4 C) 68 17 98 % 5\' 10"  (1.778 m) 83.9 kg   No intake/output data recorded. No intake/output data recorded.      General : Alert, cooperative, no distress, appears stated age   Head:  Normocephalic/atraumatic    Eyes: PERRL, conjunctiva/corneas clear, EOM's intact. Fundi could not be visualized Neck: Supple Chest:  Respirations unlabored Chest wall: no tenderness or deformity Heart: Regular rate and rhythm Abdomen: Soft, nontender and nondistended Extremities: warm and well-perfused Skin: normal turgor, color and texture Neurologic:  Alert, oriented x 3.  Eyes open spontaneously. PERRL, EOMI, VFC, no facial droop. V1-3 intact.  No dysarthria, tongue protrusion symmetric.   CNII-XII intact. Normal strength, sensation and reflexes throughout.  No pronator drift, full strength in legs.  + L Spurling's       Data ReviewCBC:  Lab Results  Component Value Date   WBC 7.0 11/19/2022   RBC 4.60 11/19/2022   BMP:  Lab Results  Component Value Date   GLUCOSE 95 11/16/2022   GLUCOSE 122 (H) 06/25/2021   CO2 28 11/16/2022   BUN 21 11/16/2022   CREATININE 1.31 (H) 11/16/2022   CREATININE 1.06 01/09/2016   CALCIUM 10.0 11/16/2022   Radiology review:   See clinic note  Assessment:   Active Problems:   * No active hospital problems. 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  Risks discussed included, but were not limited to bleeding, pain, infection, dysphagia, dysphonia, scar, spinal fluid leak, neurologic deficit, instability, pseudoarthrosis, adjacent segment disease, damage to nearby organs, and death.  Informed consent was obtained.

## 2022-11-25 NOTE — Transfer of Care (Signed)
Immediate Anesthesia Transfer of Care Note  Patient: Daniel Carey  Procedure(s) Performed: Anterior Cervical Decompression/Discectomy Fusion Cervical Three-Four  Patient Location: PACU  Anesthesia Type:General  Level of Consciousness: awake, alert , and patient cooperative  Airway & Oxygen Therapy: Patient Spontanous Breathing and Patient connected to face mask oxygen  Post-op Assessment: Report given to RN and Post -op Vital signs reviewed and stable  Post vital signs: Reviewed and stable  Last Vitals:  Vitals Value Taken Time  BP 159/96 11/25/22 1537  Temp    Pulse 61 11/25/22 1541  Resp 15 11/25/22 1541  SpO2 97 % 11/25/22 1541  Vitals shown include unfiled device data.  Last Pain:  Vitals:   11/25/22 1107  PainSc: 6       Patients Stated Pain Goal: 2 (11/25/22 1107)  Complications: There were no known notable events for this encounter.

## 2022-11-25 NOTE — Anesthesia Procedure Notes (Signed)
Procedure Name: Intubation Date/Time: 11/25/2022 1:08 PM  Performed by: Owens Loffler, RNPre-anesthesia Checklist: Patient identified, Emergency Drugs available, Suction available, Patient being monitored and Timeout performed Patient Re-evaluated:Patient Re-evaluated prior to induction Oxygen Delivery Method: Circle system utilized Preoxygenation: Pre-oxygenation with 100% oxygen Induction Type: IV induction Ventilation: Mask ventilation without difficulty Laryngoscope Size: Glidescope and 4 Grade View: Grade I Tube type: Oral Tube size: 7.0 mm Number of attempts: 1 Airway Equipment and Method: Stylet and Video-laryngoscopy Placement Confirmation: ETT inserted through vocal cords under direct vision, positive ETCO2, CO2 detector and breath sounds checked- equal and bilateral Secured at: 22 cm Tube secured with: Tape Dental Injury: Teeth and Oropharynx as per pre-operative assessment

## 2022-11-25 NOTE — Op Note (Signed)
PREOP DIAGNOSIS: Cervical myeloradiculopathy  POSTOP DIAGNOSIS: Cervical myeloradiculopathy   PROCEDURE: 1. Arthrodesis C3-4, anterior interbody technique, including Discectomy for decompression of spinal cord and exiting nerve roots with foraminotomies  2. Placement of intervertebral biomechanical device C3-4 3. Placement of anterior instrumentation consisting of interbody plate and screws C3-4 4. Use of morselized bone allograft  5. Use of intraoperative microscope  SURGEON: Dr. Hoyt Koch, MD  ASSISTANT: Patrici Ranks, PA.  Please note, no qualified trainees were available to assist with the procedure.  Assistance was required for retraction of the visceral structures to safely allow for instrumentation.  ANESTHESIA: General Endotracheal  EBL: 25 ml  IMPLANTS: Medtronic 7 mm Titan-C cage 19 mm Zevo plate 16 mm screws x 4 DBM  SPECIMENS: None  DRAINS: None  COMPLICATIONS: None immediate  CONDITION: Hemodynamically stable to PACU  HISTORY: Daniel Carey is a 72 y.o. y.o. male who presented with cervical myeloradiculopathy mainly affecting his left neck and shoulder.  CT myelogram showed severe cervical stenosis with cord compression at C3-4.  Risks, benefits, alternatives, and expected convalescence were discussed with the patient.  Risks discussed included but were not limited to bleeding, pain, infection, dysphagia, dysphonia, pseudoarthrosis, hardware failure, adjacent segment disease, CSF leak, neurologic deficits, weakness, numbness, paralysis, coma, and death. After all questions were answered, informed consent was obtained.  PROCEDURE IN DETAIL: The patient was brought to the operating room and transferred to the operative table. After induction of general anesthesia, the patient was positioned on the operative table in the supine position with all pressure points meticulously padded. The skin of the neck was then prepped and draped in the usual sterile  fashion.  After timeout was conducted, the skin was infiltrated with local anesthetic. Skin incision was then made sharply and Bovie electrocautery was used to dissect the subcutaneous tissue until the platysma was identified. The platysma was then divided and undermined. The sternocleidomastoid muscle was then identified and, utilizing natural fascial planes in the neck, the prevertebral fascia was identified and the carotid sheath was retracted laterally and the trachea and esophagus retracted medially. Again using fluoroscopy, the C3-4 disc space was identified. Bovie electrocautery was used to dissect in the subperiosteal plane and elevate the bilateral longus coli muscles. Self-retaining retractors were then placed. Caspar distraction pins were placed in the adjacent bodies to allow for gentle distraction.  At this point, the microscope was draped and brought into the field, and the remainder of the case was done under the microscope using microdissecting technique.  The disc space was incised sharply and combination of high speed drill, curettes, and rongeurs were use to initially complete a discectomy. The high-speed drill was then used to complete discectomy until the posterior annulus was identified and removed and the posterior longitudinal ligament was identified. Using a nerve hook, the PLL was elevated, and Kerrison rongeurs were used to remove the posterior longitudinal ligament and the ventral thecal sac was identified. Using a combination of curettes and rongeurs, complete decompression of the thecal sac and exiting nerve roots at this level was completed, and verified with easy passage of micro-nerve hook centrally and in the bilateral foramina.  Having completed our decompression, attention was turned to placement of the intervertebral device. Trial spacers were used to select a size 7 mm graft. This graft was then filled with morcellized allograft, and inserted under live  fluoroscopy.  After placement of the intervertebral devices, the caspar pins were removed.  An anterior cervical plate was placed across the  interspaces for anterior fixation.  Using a high-speed drill, the cortex of the cervical vertebral bodies was punctured, and screws inserted in the vertebral bodies. Final fluoroscopic images in AP and lateral projections were taken to confirm good hardware placement.  At this point, after all counts were verified to be correct, meticulous hemostasis was secured using a combination of bipolar electrocautery and passive hemostatics. The platysma muscle was then closed using interrupted 3-0 Vicryl sutures, and the skin was closed with a 4-0 monocryl in subcuticular fashion. Sterile dressings were then applied and the drapes removed.  The patient tolerated the procedure well and was extubated in the room and taken to the postanesthesia care unit in stable condition.  All counts were correct at the end of the procedure.

## 2022-11-26 ENCOUNTER — Encounter (HOSPITAL_COMMUNITY): Payer: Self-pay | Admitting: Neurosurgery

## 2022-11-27 NOTE — Anesthesia Postprocedure Evaluation (Signed)
Anesthesia Post Note  Patient: Daniel Carey  Procedure(s) Performed: Anterior Cervical Decompression/Discectomy Fusion Cervical Three-Four     Patient location during evaluation: PACU Anesthesia Type: General Level of consciousness: awake and alert Pain management: pain level controlled Vital Signs Assessment: post-procedure vital signs reviewed and stable Respiratory status: spontaneous breathing, nonlabored ventilation, respiratory function stable and patient connected to nasal cannula oxygen Cardiovascular status: blood pressure returned to baseline and stable Postop Assessment: no apparent nausea or vomiting Anesthetic complications: no   There were no known notable events for this encounter.  Last Vitals:  Vitals:   11/25/22 1615 11/25/22 1630  BP: (!) 141/81 (!) 150/90  Pulse: 67 63  Resp: 11 14  Temp:  36.7 C  SpO2: 95% 93%    Last Pain:  Vitals:   11/25/22 1630  PainSc: 5                  Conception Doebler

## 2022-12-01 ENCOUNTER — Encounter (HOSPITAL_COMMUNITY): Payer: Self-pay | Admitting: Neurosurgery

## 2022-12-03 ENCOUNTER — Encounter (INDEPENDENT_AMBULATORY_CARE_PROVIDER_SITE_OTHER): Payer: Self-pay

## 2022-12-03 ENCOUNTER — Encounter: Payer: Self-pay | Admitting: Internal Medicine

## 2022-12-03 ENCOUNTER — Ambulatory Visit: Payer: Medicare Other | Admitting: Internal Medicine

## 2022-12-03 VITALS — BP 138/80 | HR 67 | Ht 70.0 in | Wt 183.6 lb

## 2022-12-03 DIAGNOSIS — E039 Hypothyroidism, unspecified: Secondary | ICD-10-CM

## 2022-12-03 DIAGNOSIS — Z7984 Long term (current) use of oral hypoglycemic drugs: Secondary | ICD-10-CM

## 2022-12-03 DIAGNOSIS — E1165 Type 2 diabetes mellitus with hyperglycemia: Secondary | ICD-10-CM

## 2022-12-03 DIAGNOSIS — E7849 Other hyperlipidemia: Secondary | ICD-10-CM | POA: Diagnosis not present

## 2022-12-03 NOTE — Patient Instructions (Signed)
Please continue off Metformin.  Start checking blood sugars 1x a day, rotating check times.  Please return in 4 months with your sugar log.

## 2022-12-03 NOTE — Progress Notes (Signed)
Patient ID: Daniel Carey, male   DOB: [birth year], 72 y.o.   MRN: [id number]  HPI  Daniel Carey is a 72 y.o.-year-old male, returning for f/u for hypothyroidism, dx 2012 and DM2, diet-controlled, non-insulin-dependent, with complications (CAD - s/p CABG 07/2020, carotid atherosclerosis).  Last visit 6 months ago.  Interim history: No increased urination, blurry vision, nausea. He tripped and fell 2 weeks prior to our last visit>> cervical vertebral fracture >> had cervical decompression surgery 11/25/2022.  He is doing well after the surgery. He was not very active before the surgery 2/2 pain.  Pt. was dx with hypothyroidism in 2012 >> he was on levothyroxine up to 200 mcg in 2016 >> but did not feel well so he stopped. Afterwards, his TFTs fluctuated but in the last 3 years they were mostly in the normal range.  Reviewed previous TFTs: Lab Results  Component Value Date   TSH 5.14 05/29/2022   TSH 3.73 06/25/2021   TSH 5.070 (H) 03/07/2021   TSH 3.96 03/20/2020   TSH 3.83 08/22/2019   TSH 3.99 01/31/2018   TSH 4.50 08/02/2017   TSH 4.89 (H) 12/08/2016   TSH 4.13 03/06/2016   TSH 9.84 (H) 09/05/2015   FREET4 0.85 05/29/2022   FREET4 1.04 03/07/2021   FREET4 0.75 03/20/2020   FREET4 0.80 01/31/2018   FREET4 0.65 08/02/2017   FREET4 0.83 12/08/2016   FREET4 0.67 03/06/2016   FREET4 0.66 09/05/2015   FREET4 1.25 05/07/2015   FREET4 0.79 08/02/2014    Lab Results  Component Value Date   T3FREE 2.6 05/29/2022   T3FREE 2.7 03/07/2021   T3FREE 3.0 03/20/2020   T3FREE 2.8 01/31/2018   T3FREE 2.6 08/02/2017   T3FREE 3.3 12/08/2016   T3FREE 3.1 03/06/2016   T3FREE 3.2 09/05/2015   T3FREE 3.9 05/07/2015   T3FREE 2.9 08/02/2014   Pt denies: - feeling nodules in neck - hoarseness - dysphagia - choking   DM2:  Reviewed HbA1c levels: Lab Results  Component Value Date   HGBA1C 7.4 (H) 11/19/2022   HGBA1C 6.8 (A) 05/29/2022   HGBA1C 6.8 (H) 11/11/2021  07/03/2016: HbA1c  6.8%  He was on: - Metformin 500 mg 2x a day >> stopped 2/2 nausea  - Metformin ER 1000 mg with dinner >> nausea  Plan on: - Metformin ER 500 mg twice a day with meals >> 3x a day -he had nausea with taking 1000 mg at the same time >> stopped 08/2021  He continues to limit sweets and to not eat fried foods.  However, he is eating cereal without milk as a snack in the evening. He usually also goes to the gym-[employer] sne[employer].  He checks his sugars 0-1x a day: - am: 120-140 >> 138-145, 147 >> 100-165 >> 119 >> 135 >> 132, 133, 149 - 2h after b'fast: n/c >> 119, 144 >> n/c >> 148, 162 - lunch: 80-150 >> 120s >> 80, 96-117 >> 106-111 >> 97, 113 - 2h after lunch: 100 >> n/c >> 101, 110, 118 >> 101 >> 81, 126, 173 - dinner: 90-140 >> 120s >> 90s-120 >> 94, 114  >> 97-115, 146 - 2h after dinner: n/c - bedtime: n/c Feels sugars <80. Lowest: 94 >> 101 >> 81 Highest: 144 >> 200 >> 173  Meter: ReliOn >> ReliOn Premier Blu >> Verio Reflect  + CKD. Last BUN/Cr: Lab Results  Component Value Date   BUN 21 11/16/2022   BUN 22 06/25/2021   BUN 18 11/11/2020  CREATININE 1.31 (H) 11/16/2022   CREATININE 1.20 06/25/2021   CREATININE 1.23 11/11/2020  He is off ACE inhibitor/ARB due to history of high potassium.  Latest potassium levels have been normal: Lab Results  Component Value Date   K 4.6 11/16/2022   K 4.6 06/25/2021   K 4.8 11/11/2020   K 4.3 07/23/2020   K 4.0 07/22/2020   + HL: Lab Results  Component Value Date   CHOL 129 11/16/2022   HDL 53 11/16/2022   LDLCALC 58 11/16/2022   LDLDIRECT 107.0 02/27/2013   TRIG 95 11/16/2022   CHOLHDL 2.4 11/16/2022  On Crestor 20, changed from Niacin >> Zetia 10 mg daily. Also on fenofibrate 145 mg daily - restarted 04/2022.  Last eye exam in 2024: reportedly No DR, no glaucoma, + incipient cataracts.  He sees Dr Elease Hashimoto >> had angioplasy x3 in the past, and 2 stents placed. He he has a history of an AMI 30 (mild). He has a Mobitz 1  AVB. Had a catheterization in 10/2016 >> partial blockages. He has OSA and wears a CPAP. He had a colonoscopy 02/2017 >> E coli sepsis. He usually stays active playing golf, but not since his CABG.  ROS: + See HPI  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    noted on EKG March 2013   CAD (coronary artery disease)    s/p atherectomy, PTCA/DES x2 mid & distal RCA 06/30/12   Carotid artery occlusion    Chest pain    Complication of anesthesia    heart rate dropping with propofol last procedure   COPD (chronic obstructive pulmonary disease) (HCC)    Dyspnea    ED (erectile dysfunction)    Esophageal stricture    External hemorrhoids without mention of complication    GERD (gastroesophageal reflux disease)    Hiatal hernia    History of kidney stones    HLD (hyperlipidemia)    HTN (hypertension)    Myocardial infarction (HCC)    1996   Personal history of colonic polyps 03/12/2010   TUBULAR ADENOMA   Pneumonia    Presence of permanent cardiac pacemaker    Sleep apnea    wears CPAP   Tobacco abuse    Tubular adenoma of colon    Type 2 diabetes mellitus (HCC)    Past Surgical History:  Procedure Laterality Date   ANGIOPLASTY     X3   ANTERIOR CERVICAL DECOMP/DISCECTOMY FUSION N/A 11/25/2022   Procedure: Anterior Cervical Decompression/Discectomy Fusion Cervical Three-Four;  Surgeon: Bedelia Person, MD;  Location: [facility] OR;  Service: Neurosurgery;  Laterality: N/A;   CARDIAC CATHETERIZATION     8 years ago stents   COLONOSCOPY N/A 02/16/2017   Procedure: COLONOSCOPY;  Surgeon: Beverley Fiedler, MD;  Location: [facility] ENDOSCOPY;  Service: Gastroenterology;  Laterality: N/A;   COLONOSCOPY     CORONARY ARTERY BYPASS GRAFT N/A 07/19/2020   Procedure: CORONARY ARTERY BYPASS GRAFTING (CABG) TIMES THREE USING LEFT INTERNAL MAMMARY ARTERY AND RIGHT GREATER SAPHENOUS VEIN HARVESTED ENDOSCOPICALLY;  Surgeon: Alleen Borne, MD;  Location: [facility] OR;   Service: Open Heart Surgery;  Laterality: N/A;   INSERT / REPLACE / REMOVE PACEMAKER  06/20/2018   LEFT HEART CATH AND CORONARY ANGIOGRAPHY N/A 11/05/2016   Procedure: Left Heart Cath and Coronary Angiography;  Surgeon: Yvonne Kendall, MD;  Location: [facility] INVASIVE CV LAB;  Service: Cardiovascular;  Laterality: N/A;   LEFT HEART  CATH AND CORONARY ANGIOGRAPHY N/A 07/12/2020   Procedure: LEFT HEART CATH AND CORONARY ANGIOGRAPHY;  Surgeon: Kathleene Hazel, MD;  Location: [facility] INV[facility] CV LAB;  Service: Cardiovascular;  Laterality: N/A;   LUMBAR LAMINECTOMY/DECOMPRESSION MICRODISCECTOMY N/A 05/12/2018   Procedure: Microlumbar decompression L2-3, possible L1-2;  Surgeon: Jene Every, MD;  Location: [facility] OR;  Service: Orthopedics;  Laterality: N/A;  2 hrs, Microlumbar decompression L2-3, possible L1-2   PACEMAKER IMPLANT N/A 06/20/2018   Procedure: PACEMAKER IMPLANT;  Surgeon: Marinus Maw, MD;  Location: [facility];  Service: Cardiovascular;  Laterality: N/A;   PERCUTANEOUS CORONARY ROTOBLATOR INTERVENTION (PCI-R) N/A 06/30/2012   Procedure: PERCUTANEOUS CORONARY ROTOBLATOR INTERVENTION (PCI-R);  Surgeon: Kathleene Hazel, MD;  Location: [facility] CATH LAB;  Service: Cardiovascular;  Laterality: N/A;   TEE WITHOUT CARDIOVERSION N/A 07/19/2020   Procedure: TRANSESOPHAGEAL ECHOCARDIOGRAM (TEE);  Surgeon: Alleen Borne, MD;  Location: [facility] OR;  Service: Open Heart Surgery;  Laterality: N/A;   VIDEO BRONCHOSCOPY  08/13/2011   Procedure: VIDEO BRONCHOSCOPY WITHOUT FLUORO;  Surgeon: Nyoka Cowden, MD;  Location: [facility];  Service: Cardiopulmonary;  Laterality: Bilateral;   History   Social History   Marital Status: Married    Spouse Name: N/A    Number of Children: 1   Occupational History   Regulatory affairs officer    Social History Main Topics   Smoking status: Former Smoker -- 0.50 packs/day for 40 years    Types: Cigarettes    Quit date: 08/25/2011   Smokeless tobacco:  Never Used   Alcohol Use: 0.0 oz/week     Comment: rare   Drug Use: No   Current Outpatient Medications on File Prior to Visit  Medication Sig Dispense Refill   acetaminophen (TYLENOL) 500 MG tablet Take 1,000 mg by mouth every 6 (six) hours as needed (pain).      albuterol (VENTOLIN HFA) 108 (90 Base) MCG/ACT inhaler INHALE 2 PUFFS INTO THE LUNGS EVERY 6 HOURS AS NEEDED FOR WHEEZE OR SHORTNESS OF BREATH 8.5 each 3   aspirin EC 81 MG tablet Take 1 tablet (81 mg total) by mouth daily. Swallow whole.     Blood Glucose Calibration (ONETOUCH VERIO) High LIQD      ezetimibe (ZETIA) 10 MG tablet Take 1 tablet (10 mg total) by mouth daily. 90 tablet 3   famotidine (PEPCID) 20 MG tablet Take 20 mg by mouth daily as needed for heartburn.     fenofibrate (TRICOR) 145 MG tablet Take 1 tablet (145 mg total) by mouth daily. 90 tablet 3   hydrochlorothiazide (HYDRODIURIL) 25 MG tablet TAKE 1/2 TABLET (12.5 MG TOTAL) BY MOUTH DAILY AS NEEDED. 90 tablet 1   HYDROcodone-acetaminophen (NORCO/VICODIN) 5-325 MG tablet Take 1 tablet by mouth every 4 (four) hours as needed for moderate pain. 20 tablet 0   ipratropium (ATROVENT) 0.06 % nasal spray PLACE 2 SPRAYS INTO BOTH NOSTRILS 4 (FOUR) TIMES DAILY AS DIRECTED (Patient taking differently: Place 2 sprays into both nostrils 4 (four) times daily as needed for rhinitis.) 13 mL 11   methocarbamol (ROBAXIN-750) 750 MG tablet Take 1 tablet (750 mg total) by mouth every 8 (eight) hours as needed for muscle spasms. 90 tablet 0   metoprolol tartrate (LOPRESSOR) 25 MG tablet TAKE 1/2 TABLET TWICE A DAY BY MOUTH 90 tablet 3   nitroGLYCERIN (NITROSTAT) 0.4 MG SL tablet Place 1 tablet (0.4 mg total) under the tongue every 5 (five) minutes as needed for chest pain. 25 tablet 6  omeprazole (PRILOSEC) 40 MG capsule Take 1 capsule (40 mg total) by mouth daily. (Patient taking differently: Take 40 mg by mouth daily as needed (acid reflux).) 90 capsule 3   ONETOUCH VERIO test strip 1  each daily.     Pharmacist Choice Lancets MISC      rosuvastatin (CRESTOR) 20 MG tablet TAKE 1 TABLET BY MOUTH EVERY DAY 90 tablet 3   sildenafil (VIAGRA) 100 MG tablet Take 1 tablet by mouth prior to sexual activity as needed 15 tablet 11   No current facility-administered medications on file prior to visit.   Allergies  Allergen Reactions   Amlodipine Besylate Other (See Comments)    Headache, weakness, dizziness   Propofol Other (See Comments)    Heart rate dropped     Lisinopril Cough and Rash   Rosuvastatin Other (See Comments)     Corncern for muscle aches but he is tolerating now    Family History  Problem Relation Age of Onset   Diabetes Mother        and brother    Kidney failure Mother    Heart disease Father        hx of MI   Breast cancer Sister    Colon cancer Neg Hx    Stomach cancer Neg Hx    Esophageal cancer Neg Hx    Rectal cancer Neg Hx    PE: BP 138/80   Pulse 67   Ht 5\' 10"  (1.778 m)   Wt 183 lb 9.6 oz (83.3 kg)   SpO2 97%   BMI 26.34 kg/m  Wt Readings from Last 3 Encounters:  12/03/22 183 lb 9.6 oz (83.3 kg)  11/25/22 185 lb (83.9 kg)  11/19/22 186 lb 11.2 oz (84.7 kg)   Constitutional: Slightly overweight, in NAD Eyes: no exophthalmos ENT: no masses palpated in neck, no cervical lymphadenopathy Cardiovascular: RRR, No MRG Respiratory: CTA B Musculoskeletal: no deformities Skin:  no rashes Neurological: no tremor with outstretched hands Diabetic Foot Exam - Simple   Simple Foot Form Diabetic Foot exam was performed with the following findings: Yes 12/03/2022  2:19 PM  Visual Inspection No deformities, no ulcerations, no other skin breakdown bilaterally: Yes Sensation Testing Intact to touch and monofilament testing bilaterally: Yes Pulse Check See comments: Yes Comments Slightly decreased post. Tibialis pulses B B onychodystrophy - all toes    ASSESSMENT: 1. Mild hypothyroidism  2. DM2, diet- controlled - CAD, s/p CABG  07/19/2020 - Carotid atherosclerosis - per carotid ultrasound 08/2021 -No need for surgery at  3. HL  PLAN:  1. Patient with history of mild hypothyroidism, not on levothyroxine yet -Latest TSH was normal: Lab Results  Component Value Date   TSH 5.14 05/29/2022  -He would prefer to continue without levothyroxine if possible -He has no complaints at today's visit and has good energy -We will recheck his TFTs at next visit  2. DM2 -Patient with uncontrolled type 2 diabetes, but with worse control in spring 2022, when HbA1c returned elevated, at 7.3%.  At that time, he was started on low-dose metformin in the hospital, which he could not tolerate due to nausea.  He tolerated the metformin ER at a lower dose well, but stopped last year.  He started to improve diet and was more consistent with exercise.  At last visit, HbA1c was stable, at 6.8%.  He was not checking sugars consistently and I advised him to start doing so.  Whenever he was checking, sugars are mostly at goal  with only occasional mild hyperglycemic values.  We continued without medications.  However, he had another HbA1c obtained at the beginning of this month, 2 weeks ago, which is higher, at 7.4%. -At today's visit, sugars appear to be mostly at goal, with slightly higher numbers in the morning, but overall not much change from before.  He attributes the higher HbA1c to the lack of activity and increased pain before his cervical surgery.  Now he plans to resume activity and exercise so for now, I did not recommend a change in regimen, particularly since he is still reticent to increase his medication burden.  I did recommend to keep a close eye on his sugars and try to check daily, rotating check times. - I advised him to: Patient Instructions  Please continue off Metformin.  Start checking blood sugars 1x a day, rotating check times.  Please return in 4 months with your sugar log.   - advised to check sugars at different times  of the day - 1x a day, rotating check times - advised for yearly eye exams >> he is UTD - return to clinic in 6 months  3.  Hyperlipidemia - Reviewed latest lipid panel from 10/2022: LDL slightly higher than our goal of less than 55, otherwise fractions at goal: Lab Results  Component Value Date   CHOL 129 11/16/2022   HDL 53 11/16/2022   LDLCALC 58 11/16/2022   LDLDIRECT 107.0 02/27/2013   TRIG 95 11/16/2022   CHOLHDL 2.4 11/16/2022  - Continues Crestor 20 mg daily, Zetia 10 mg daily and fenofibrate 145 mg daily without side effects.  Carlus Pavlov, MD PhD [facility] Endocrinology

## 2022-12-20 ENCOUNTER — Other Ambulatory Visit: Payer: Self-pay | Admitting: Family Medicine

## 2022-12-22 ENCOUNTER — Ambulatory Visit (INDEPENDENT_AMBULATORY_CARE_PROVIDER_SITE_OTHER): Payer: Medicare Other | Admitting: Family Medicine

## 2022-12-22 ENCOUNTER — Encounter: Payer: Self-pay | Admitting: Family Medicine

## 2022-12-22 VITALS — BP 138/85 | HR 64 | Temp 97.7°F | Ht 70.0 in | Wt 183.6 lb

## 2022-12-22 DIAGNOSIS — Z87891 Personal history of nicotine dependence: Secondary | ICD-10-CM

## 2022-12-22 DIAGNOSIS — Z0001 Encounter for general adult medical examination with abnormal findings: Secondary | ICD-10-CM

## 2022-12-22 DIAGNOSIS — I2510 Atherosclerotic heart disease of native coronary artery without angina pectoris: Secondary | ICD-10-CM | POA: Diagnosis not present

## 2022-12-22 DIAGNOSIS — R351 Nocturia: Secondary | ICD-10-CM | POA: Diagnosis not present

## 2022-12-22 DIAGNOSIS — Z0000 Encounter for general adult medical examination without abnormal findings: Secondary | ICD-10-CM | POA: Diagnosis not present

## 2022-12-22 DIAGNOSIS — E785 Hyperlipidemia, unspecified: Secondary | ICD-10-CM | POA: Diagnosis not present

## 2022-12-22 DIAGNOSIS — E1169 Type 2 diabetes mellitus with other specified complication: Secondary | ICD-10-CM | POA: Diagnosis not present

## 2022-12-22 DIAGNOSIS — M5412 Radiculopathy, cervical region: Secondary | ICD-10-CM | POA: Insufficient documentation

## 2022-12-22 DIAGNOSIS — K219 Gastro-esophageal reflux disease without esophagitis: Secondary | ICD-10-CM

## 2022-12-22 DIAGNOSIS — I10 Essential (primary) hypertension: Secondary | ICD-10-CM

## 2022-12-22 LAB — MICROALBUMIN / CREATININE URINE RATIO
Creatinine,U: 44.5 mg/dL
Microalb Creat Ratio: 2.1 mg/g (ref 0.0–30.0)
Microalb, Ur: 1 mg/dL (ref 0.0–1.9)

## 2022-12-22 NOTE — Assessment & Plan Note (Signed)
Doing well after anterior cervical fusion 4 weeks ago.  He will follow-up with neurosurgery as previously planned.

## 2022-12-22 NOTE — Assessment & Plan Note (Signed)
Blood pressure at goal today on metoprolol tartrate 12.5 mg twice daily.

## 2022-12-22 NOTE — Assessment & Plan Note (Signed)
Follows with cardiology.  Had lipid panel done about a month ago which look great.  Continue Zetia 10 mg daily, Crestor 20 mg daily, and Tricor 145 mg daily.

## 2022-12-22 NOTE — Assessment & Plan Note (Signed)
On statin and aspirin per cardiology.

## 2022-12-22 NOTE — Patient Instructions (Signed)
It was very nice to see you today!  We will check your PSA today and a urine sample.  Will also refer you for lung cancer screening.  Please continue to work on diet and exercise and we will see you back next year.   Return in about 1 year (around 12/22/2023) for Annual Physical.   Take care, Dr Jimmey Ralph  PLEASE NOTE:  If you had any lab tests, please let us know if you have not heard back within a few days. You may see your results on mychart before we have a chance to review them but we will give you a call once they are reviewed by [place].   If we ordered any referrals today, please let us know if you have not heard from their office within the next week.   If you had any urgent prescriptions sent in today, please check with the pharmacy within an hour of our visit to make sure the prescription was transmitted appropriately.   Please try these tips to maintain a healthy lifestyle:  Eat at least 3 REAL meals and 1-2 snacks per day.  Aim for no more than 5 hours between eating.  If you eat breakfast, please do so within one hour of getting up.   Each meal should contain half fruits/vegetables, one quarter protein, and one quarter carbs (no bigger than a computer mouse)  Cut down on sweet beverages. This includes juice, soda, and sweet tea.   Drink at least 1 glass of water with each meal and aim for at least 8 glasses per day  Exercise at least 150 minutes every week.    Preventive Care 27 Years and Older, Male Preventive care refers to lifestyle choices and visits with your health care provider that can promote health and wellness. Preventive care visits are also called wellness exams. What can I expect for my preventive care visit? Counseling During your preventive care visit, your health care provider may ask about your: Medical history, including: Past medical problems. Family medical history. History of falls. Current health, including: Emotional well-being. Home life and  relationship well-being. Sexual activity. Memory and ability to understand (cognition). Lifestyle, including: Alcohol, nicotine or tobacco, and drug use. Access to firearms. Diet, exercise, and sleep habits. Work and work Astronomer. Sunscreen use. Safety issues such as seatbelt and bike helmet use. Physical exam Your health care provider will check your: Height and weight. These may be used to calculate your BMI (body mass index). BMI is a measurement that tells if you are at a healthy weight. Waist circumference. This measures the distance around your waistline. This measurement also tells if you are at a healthy weight and may help predict your risk of certain diseases, such as type 2 diabetes and high blood pressure. Heart rate and blood pressure. Body temperature. Skin for abnormal spots. What immunizations do I need?  Vaccines are usually given at various ages, according to a schedule. Your health care provider will recommend vaccines for you based on your age, medical history, and lifestyle or other factors, such as travel or where you work. What tests do I need? Screening Your health care provider may recommend screening tests for certain conditions. This may include: Lipid and cholesterol levels. Diabetes screening. This is done by checking your blood sugar (glucose) after you have not eaten for a while (fasting). Hepatitis C test. Hepatitis B test. HIV (human immunodeficiency virus) test. STI (sexually transmitted infection) testing, if you are at risk. Lung cancer screening. Colorectal  cancer screening. Prostate cancer screening. Abdominal aortic aneurysm (AAA) screening. You may need this if you are a current or former smoker. Talk with your health care provider about your test results, treatment options, and if necessary, the need for more tests. Follow these instructions at home: Eating and drinking  Eat a diet that includes fresh fruits and vegetables, whole  grains, lean protein, and low-fat dairy products. Limit your intake of foods with high amounts of sugar, saturated fats, and salt. Take vitamin and mineral supplements as recommended by your health care provider. Do not drink alcohol if your health care provider tells you not to drink. If you drink alcohol: Limit how much you have to 0-2 drinks a day. Know how much alcohol is in your drink. In the [place] one drink equals one 12 oz bottle of beer (355 mL), one 5 oz glass of wine (148 mL), or one 1 oz glass of hard liquor (44 mL). Lifestyle Brush your teeth every morning and night with fluoride toothpaste. Floss one time each day. Exercise for at least 30 minutes 5 or more days each week. Do not use any products that contain nicotine or tobacco. These products include cigarettes, chewing tobacco, and vaping devices, such as e-cigarettes. If you need help quitting, ask your health care provider. Do not use drugs. If you are sexually active, practice safe sex. Use a condom or other form of protection to prevent STIs. Take aspirin only as told by your health care provider. Make sure that you understand how much to take and what form to take. Work with your health care provider to find out whether it is safe and beneficial for you to take aspirin daily. Ask your health care provider if you need to take a cholesterol-lowering medicine (statin). Find healthy ways to manage stress, such as: Meditation, yoga, or listening to music. Journaling. Talking to a trusted person. Spending time with friends and family. Safety Always wear your seat belt while driving or riding in a vehicle. Do not drive: If you have been drinking alcohol. Do not ride with someone who has been drinking. When you are tired or distracted. While texting. If you have been using any mind-altering substances or drugs. Wear a helmet and other protective equipment during sports activities. If you have firearms in your house, make sure  you follow all gun safety procedures. Minimize exposure to UV radiation to reduce your risk of skin cancer. What's next? Visit your health care provider once a year for an annual wellness visit. Ask your health care provider how often you should have your eyes and teeth checked. Stay up to date on all vaccines. This information is not intended to replace advice given to you by your health care provider. Make sure you discuss any questions you have with your health care provider. Document Revised: 10/02/2020 Document Reviewed: 10/02/2020 Elsevier Patient Education  2024 [employer].

## 2022-12-22 NOTE — Assessment & Plan Note (Signed)
On omeprazole 40 mg daily. 

## 2022-12-22 NOTE — Progress Notes (Signed)
Chief Complaint:  Daniel Carey is a 72 y.o. male who presents today for his annual comprehensive physical exam.    Assessment/Plan:  Chronic Problems Addressed Today: Hyperlipidemia Follows with cardiology.  Had lipid panel done about a month ago which look great.  Continue Zetia 10 mg daily, Crestor 20 mg daily, and Tricor 145 mg daily.  Essential hypertension Blood pressure at goal today on metoprolol tartrate 12.5 mg twice daily.  CAD (coronary artery disease) On statin and aspirin per cardiology.  GERD On omeprazole 40 mg daily.  Cervical radiculopathy Doing well after anterior cervical fusion 4 weeks ago.  He will follow-up with neurosurgery as previously planned.  Preventative Healthcare: Check PSA.  Declined flu vaccine for today.  Patient Counseling(The following topics were reviewed and/or handout was given):  -Nutrition: Stressed importance of moderation in sodium/caffeine intake, saturated fat and cholesterol, caloric balance, sufficient intake of fresh fruits, vegetables, and fiber.  -Stressed the importance of regular exercise.   -Substance Abuse: Discussed cessation/primary prevention of tobacco, alcohol, or other drug use; driving or other dangerous activities under the influence; availability of treatment for abuse.   -Injury prevention: Discussed safety belts, safety helmets, smoke detector, smoking near bedding or upholstery.   -Sexuality: Discussed sexually transmitted diseases, partner selection, use of condoms, avoidance of unintended pregnancy and contraceptive alternatives.   -Dental health: Discussed importance of regular tooth brushing, flossing, and dental visits.  -Health maintenance and immunizations reviewed. Please refer to Health maintenance section.  Return to care in 1 year for next preventative visit.     Subjective:  HPI:  He has no acute complaints today. See Assessment / plan for status of chronic conditions.  He had anterior cervical  fusion about 4 weeks ago.  He is doing well postoperatively.  Lifestyle Diet: Balanced. Plenty of fruits and vegetables.  Exercise: Limited recently due to neck surgery.      12/22/2022    2:01 PM  Depression screen PHQ 2/9  Decreased Interest 0  Down, Depressed, Hopeless 0  PHQ - 2 Score 0    Health Maintenance Due  Topic Date Due   Medicare Annual Wellness (AWV)  Never done   Lung Cancer Screening  07/13/2012   Diabetic kidney evaluation - Urine ACR  07/15/2022     ROS: Per HPI, otherwise a complete review of systems was negative.   PMH:  The following were reviewed and entered/updated in epic: 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    noted on EKG March 2013   CAD (coronary artery disease)    s/p atherectomy, PTCA/DES x2 mid & distal RCA 06/30/12   Carotid artery occlusion    Chest pain    Complication of anesthesia    heart rate dropping with propofol last procedure   COPD (chronic obstructive pulmonary disease) (HCC)    Dyspnea    ED (erectile dysfunction)    Esophageal stricture    External hemorrhoids without mention of complication    GERD (gastroesophageal reflux disease)    Hiatal hernia    History of kidney stones    HLD (hyperlipidemia)    HTN (hypertension)    Myocardial infarction (HCC)    1996   Personal history of colonic polyps 03/12/2010   TUBULAR ADENOMA   Pneumonia    Presence of permanent cardiac pacemaker    Sleep apnea    wears CPAP   Tobacco abuse  Tubular adenoma of colon    Type 2 diabetes mellitus [facility])    Patient Active Problem List   Diagnosis Date Noted   Cervical radiculopathy 12/22/2022   Carotid artery stenosis 06/12/2022   Pain of left hip joint 11/24/2021   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   Pacemaker 06/22/2019   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   Spinal stenosis of lumbar region 05/12/2018   Obstructive sleep apnea 04/26/2014   Heart block  07/28/2011   Diverticulosis of colon (without mention of hemorrhage) 07/08/2011   History of colonic polyps 12/23/2010   HIATAL HERNIA 12/22/2007   Hyperlipidemia 12/30/2006   Essential hypertension 12/30/2006   CAD (coronary artery disease) 12/30/2006   GERD 12/30/2006   Past Surgical History:  Procedure Laterality Date   ANGIOPLASTY     X3   ANTERIOR CERVICAL DECOMP/DISCECTOMY FUSION N/A 11/25/2022   Procedure: Anterior Cervical Decompression/Discectomy Fusion Cervical Three-Four;  Surgeon: Bedelia Person, MD;  Location: [facility] OR;  Service: Neurosurgery;  Laterality: N/A;   CARDIAC CATHETERIZATION     8 years ago stents   COLONOSCOPY N/A 02/16/2017   Procedure: COLONOSCOPY;  Surgeon: Beverley Fiedler, MD;  Location: [facility];  Service: Gastroenterology;  Laterality: N/A;   COLONOSCOPY     CORONARY ARTERY BYPASS GRAFT N/A 07/19/2020   Procedure: CORONARY ARTERY BYPASS GRAFTING (CABG) TIMES THREE USING LEFT INTERNAL MAMMARY ARTERY AND RIGHT GREATER SAPHENOUS VEIN HARVESTED ENDOSCOPICALLY;  Surgeon: Alleen Borne, MD;  Location: [facility] OR;  Service: Open Heart Surgery;  Laterality: N/A;   INSERT / REPLACE / REMOVE PACEMAKER  06/20/2018   LEFT HEART CATH AND CORONARY ANGIOGRAPHY N/A 11/05/2016   Procedure: Left Heart Cath and Coronary Angiography;  Surgeon: Yvonne Kendall, MD;  Location: [facility] INV[facility] LAB;  Service: Cardiovascular;  Laterality: N/A;   LEFT HEART CATH AND CORONARY ANGIOGRAPHY N/A 07/12/2020   Procedure: LEFT HEART CATH AND CORONARY ANGIOGRAPHY;  Surgeon: Kathleene Hazel, MD;  Location: [facility] LAB;  Service: Cardiovascular;  Laterality: N/A;   LUMBAR LAMINECTOMY/DECOMPRESSION MICRODISCECTOMY N/A 05/12/2018   Procedure: Microlumbar decompression L2-3, possible L1-2;  Surgeon: Jene Every, MD;  Location: [facility] OR;  Service: Orthopedics;  Laterality: N/A;  2 hrs, Microlumbar decompression L2-3, possible L1-2   PACEMAKER IMPLANT N/A 06/20/2018   Procedure:  PACEMAKER IMPLANT;  Surgeon: Marinus Maw, MD;  Location: [facility];  Service: Cardiovascular;  Laterality: N/A;   PERCUTANEOUS CORONARY ROTOBLATOR INTERVENTION (PCI-R) N/A 06/30/2012   Procedure: PERCUTANEOUS CORONARY ROTOBLATOR INTERVENTION (PCI-R);  Surgeon: Kathleene Hazel, MD;  Location: [facility] CATH LAB;  Service: Cardiovascular;  Laterality: N/A;   TEE WITHOUT CARDIOVERSION N/A 07/19/2020   Procedure: TRANSESOPHAGEAL ECHOCARDIOGRAM (TEE);  Surgeon: Alleen Borne, MD;  Location: [facility] OR;  Service: Open Heart Surgery;  Laterality: N/A;   VIDEO BRONCHOSCOPY  08/13/2011   Procedure: VIDEO BRONCHOSCOPY WITHOUT FLUORO;  Surgeon: Nyoka Cowden, MD;  Location: [facility];  Service: Cardiopulmonary;  Laterality: Bilateral;    Family History  Problem Relation Age of Onset   Diabetes Mother        and brother    Kidney failure Mother    Heart disease Father        hx of MI   Breast cancer Sister    Colon cancer Neg Hx    Stomach cancer Neg Hx    Esophageal cancer Neg Hx    Rectal cancer Neg Hx     Medications- reviewed and updated  Current Outpatient Medications  Medication Sig Dispense Refill   acetaminophen (TYLENOL) 500 MG tablet Take 1,000 mg by mouth every 6 (six) hours as needed (pain).      albuterol (VENTOLIN HFA) 108 (90 Base) MCG/ACT inhaler INHALE 2 PUFFS INTO THE LUNGS EVERY 6 HOURS AS NEEDED FOR WHEEZE OR SHORTNESS OF BREATH 8.5 each 3   aspirin EC 81 MG tablet Take 1 tablet (81 mg total) by mouth daily. Swallow whole.     Blood Glucose Calibration (ONETOUCH VERIO) High LIQD      ezetimibe (ZETIA) 10 MG tablet Take 1 tablet (10 mg total) by mouth daily. 90 tablet 3   famotidine (PEPCID) 20 MG tablet Take 20 mg by mouth daily as needed for heartburn.     fenofibrate (TRICOR) 145 MG tablet Take 1 tablet (145 mg total) by mouth daily. 90 tablet 3   hydrochlorothiazide (HYDRODIURIL) 25 MG tablet TAKE 1/2 TABLET (12.5 MG TOTAL) BY MOUTH DAILY AS NEEDED. 90 tablet  1   ipratropium (ATROVENT) 0.06 % nasal spray PLACE 2 SPRAYS INTO BOTH NOSTRILS 4 (FOUR) TIMES DAILY AS DIRECTED (Patient taking differently: Place 2 sprays into both nostrils 4 (four) times daily as needed for rhinitis.) 13 mL 11   metoprolol tartrate (LOPRESSOR) 25 MG tablet TAKE 1/2 TABLET TWICE A DAY BY MOUTH 90 tablet 3   nitroGLYCERIN (NITROSTAT) 0.4 MG SL tablet Place 1 tablet (0.4 mg total) under the tongue every 5 (five) minutes as needed for chest pain. 25 tablet 6   omeprazole (PRILOSEC) 40 MG capsule Take 1 capsule (40 mg total) by mouth daily. (Patient taking differently: Take 40 mg by mouth daily as needed (acid reflux).) 90 capsule 3   ONETOUCH VERIO test strip 1 each daily.     Pharmacist Choice Lancets MISC      rosuvastatin (CRESTOR) 20 MG tablet TAKE 1 TABLET BY MOUTH EVERY DAY 90 tablet 3   sildenafil (VIAGRA) 100 MG tablet Take 1 tablet by mouth prior to sexual activity as needed 15 tablet 11   No current facility-administered medications for this visit.    Allergies-reviewed and updated Allergies  Allergen Reactions   Amlodipine Besylate Other (See Comments)    Headache, weakness, dizziness   Propofol Other (See Comments)    Heart rate dropped     Lisinopril Cough and Rash   Rosuvastatin Other (See Comments)     Corncern for muscle aches but he is tolerating now     Social History   Socioeconomic History   Marital status: Married    Spouse name: Not on file   Number of children: 1   Years of education: Not on file   Highest education level: Not on file  Occupational History   Occupation: Regulatory affairs officer  Tobacco Use   Smoking status: Former    Current packs/day: 0.00    Average packs/day: 0.5 packs/day for 40.0 years (20.0 ttl pk-yrs)    Types: Cigarettes    Start date: 08/25/1971    Quit date: 08/25/2011    Years since quitting: 11.3   Smokeless tobacco: Never  Vaping Use   Vaping status: Never Used  Substance and Sexual Activity   Alcohol  use: Not Currently   Drug use: No   Sexual activity: Not Currently  Other Topics Concern   Not on file  Social History Narrative   Not on file   Social Determinants of Health   Financial Resource Strain: Not on file  Food Insecurity: Low Risk  (  07/16/2022)   [facility]   Hunger Vital Sign    Worried About Running Out of Food in the Last Year: Never true    Ran Out of Food in the Last Year: Never true  Transportation Needs: Not on file (07/16/2022)  Physical Activity: Not on file  Stress: Not on file  Social Connections: Not on file        Objective:  Physical Exam: BP 138/85   Pulse 64   Temp 97.7 F (36.5 C) (Temporal)   Ht 5\' 10"  (1.778 m)   Wt 183 lb 9.6 oz (83.3 kg)   SpO2 98%   BMI 26.34 kg/m   Body mass index is 26.34 kg/m. Wt Readings from Last 3 Encounters:  12/22/22 183 lb 9.6 oz (83.3 kg)  12/03/22 183 lb 9.6 oz (83.3 kg)  11/25/22 185 lb (83.9 kg)   Gen: NAD, resting comfortably HEENT: TMs normal bilaterally. OP clear. No thyromegaly noted.  CV: RRR with no murmurs appreciated Pulm: NWOB, CTAB with no crackles, wheezes, or rhonchi GI: Normal bowel sounds present. Soft, Nontender, Nondistended. MSK: no edema, cyanosis, or clubbing noted Skin: warm, dry Neuro: CN2-12 grossly intact. Strength 5/5 in upper and lower extremities. Reflexes symmetric and intact bilaterally.  Psych: Normal affect and thought content     Layah Skousen M. Jimmey Ralph, MD 12/22/2022 2:42 PM

## 2022-12-23 LAB — PSA: PSA: 0.4 ng/mL (ref 0.10–4.00)

## 2022-12-24 NOTE — Progress Notes (Signed)
PSA and urine test are normal.  We can recheck again in a year.

## 2022-12-31 ENCOUNTER — Ambulatory Visit (INDEPENDENT_AMBULATORY_CARE_PROVIDER_SITE_OTHER): Payer: Medicare Other

## 2022-12-31 ENCOUNTER — Other Ambulatory Visit: Payer: Self-pay | Admitting: Cardiovascular Disease

## 2022-12-31 DIAGNOSIS — I441 Atrioventricular block, second degree: Secondary | ICD-10-CM | POA: Diagnosis not present

## 2022-12-31 LAB — CUP PACEART REMOTE DEVICE CHECK
Battery Remaining Longevity: 56 mo
Battery Remaining Percentage: 54 %
Battery Voltage: 2.99 V
Brady Statistic AP VP Percent: 6.2 %
Brady Statistic AP VS Percent: 1 %
Brady Statistic AS VP Percent: 93 %
Brady Statistic AS VS Percent: 1 %
Brady Statistic RA Percent Paced: 5.5 %
Brady Statistic RV Percent Paced: 99 %
Date Time Interrogation Session: 20240912020013
Implantable Lead Connection Status: 753985
Implantable Lead Connection Status: 753985
Implantable Lead Implant Date: 20200302
Implantable Lead Implant Date: 20200302
Implantable Lead Location: 753859
Implantable Lead Location: 753860
Implantable Lead Model: 3830
Implantable Pulse Generator Implant Date: 20200302
Lead Channel Impedance Value: 480 Ohm
Lead Channel Impedance Value: 490 Ohm
Lead Channel Pacing Threshold Amplitude: 0.75 V
Lead Channel Pacing Threshold Amplitude: 0.75 V
Lead Channel Pacing Threshold Pulse Width: 0.5 ms
Lead Channel Pacing Threshold Pulse Width: 0.5 ms
Lead Channel Sensing Intrinsic Amplitude: 1.5 mV
Lead Channel Sensing Intrinsic Amplitude: 12 mV
Lead Channel Setting Pacing Amplitude: 2 V
Lead Channel Setting Pacing Amplitude: 2.5 V
Lead Channel Setting Pacing Pulse Width: 0.5 ms
Lead Channel Setting Sensing Sensitivity: 4 mV
Pulse Gen Model: 2272
Pulse Gen Serial Number: 9114136

## 2023-01-11 NOTE — Progress Notes (Signed)
Remote pacemaker transmission.   

## 2023-01-22 DIAGNOSIS — M5412 Radiculopathy, cervical region: Secondary | ICD-10-CM | POA: Diagnosis not present

## 2023-01-26 DIAGNOSIS — M5416 Radiculopathy, lumbar region: Secondary | ICD-10-CM | POA: Diagnosis not present

## 2023-01-26 DIAGNOSIS — M545 Low back pain, unspecified: Secondary | ICD-10-CM | POA: Diagnosis not present

## 2023-01-28 ENCOUNTER — Ambulatory Visit: Payer: Medicare Other | Attending: Internal Medicine | Admitting: Internal Medicine

## 2023-01-28 ENCOUNTER — Encounter: Payer: Self-pay | Admitting: Internal Medicine

## 2023-01-28 VITALS — BP 150/92 | HR 70 | Ht 70.0 in | Wt 186.0 lb

## 2023-01-28 DIAGNOSIS — I442 Atrioventricular block, complete: Secondary | ICD-10-CM

## 2023-01-28 LAB — CUP PACEART INCLINIC DEVICE CHECK
Battery Remaining Longevity: 58 mo
Battery Voltage: 2.99 V
Brady Statistic RA Percent Paced: 5.7 %
Brady Statistic RV Percent Paced: 99.35 %
Date Time Interrogation Session: 20241010164519
Implantable Lead Connection Status: 753985
Implantable Lead Connection Status: 753985
Implantable Lead Implant Date: 20200302
Implantable Lead Implant Date: 20200302
Implantable Lead Location: 753859
Implantable Lead Location: 753860
Implantable Lead Model: 3830
Implantable Pulse Generator Implant Date: 20200302
Lead Channel Impedance Value: 512.5 Ohm
Lead Channel Impedance Value: 550 Ohm
Lead Channel Pacing Threshold Amplitude: 0.5 V
Lead Channel Pacing Threshold Amplitude: 0.5 V
Lead Channel Pacing Threshold Amplitude: 0.75 V
Lead Channel Pacing Threshold Amplitude: 0.75 V
Lead Channel Pacing Threshold Pulse Width: 0.5 ms
Lead Channel Pacing Threshold Pulse Width: 0.5 ms
Lead Channel Pacing Threshold Pulse Width: 0.5 ms
Lead Channel Pacing Threshold Pulse Width: 0.5 ms
Lead Channel Sensing Intrinsic Amplitude: 12 mV
Lead Channel Sensing Intrinsic Amplitude: 3.8 mV
Lead Channel Setting Pacing Amplitude: 2 V
Lead Channel Setting Pacing Amplitude: 2.5 V
Lead Channel Setting Pacing Pulse Width: 0.5 ms
Lead Channel Setting Sensing Sensitivity: 4 mV
Pulse Gen Model: 2272
Pulse Gen Serial Number: 9114136

## 2023-01-28 IMAGING — DX DG CHEST 2V
2 series · 2 of 2 positions shown · non-contrast
Comparison: July 23, 2020

CLINICAL DATA: Status post coronary artery bypass grafting and
cardiac arrhythmia with pacemaker placement

EXAM:
CHEST - 2 VIEW

[dg chest 2 view (1 of 2)]
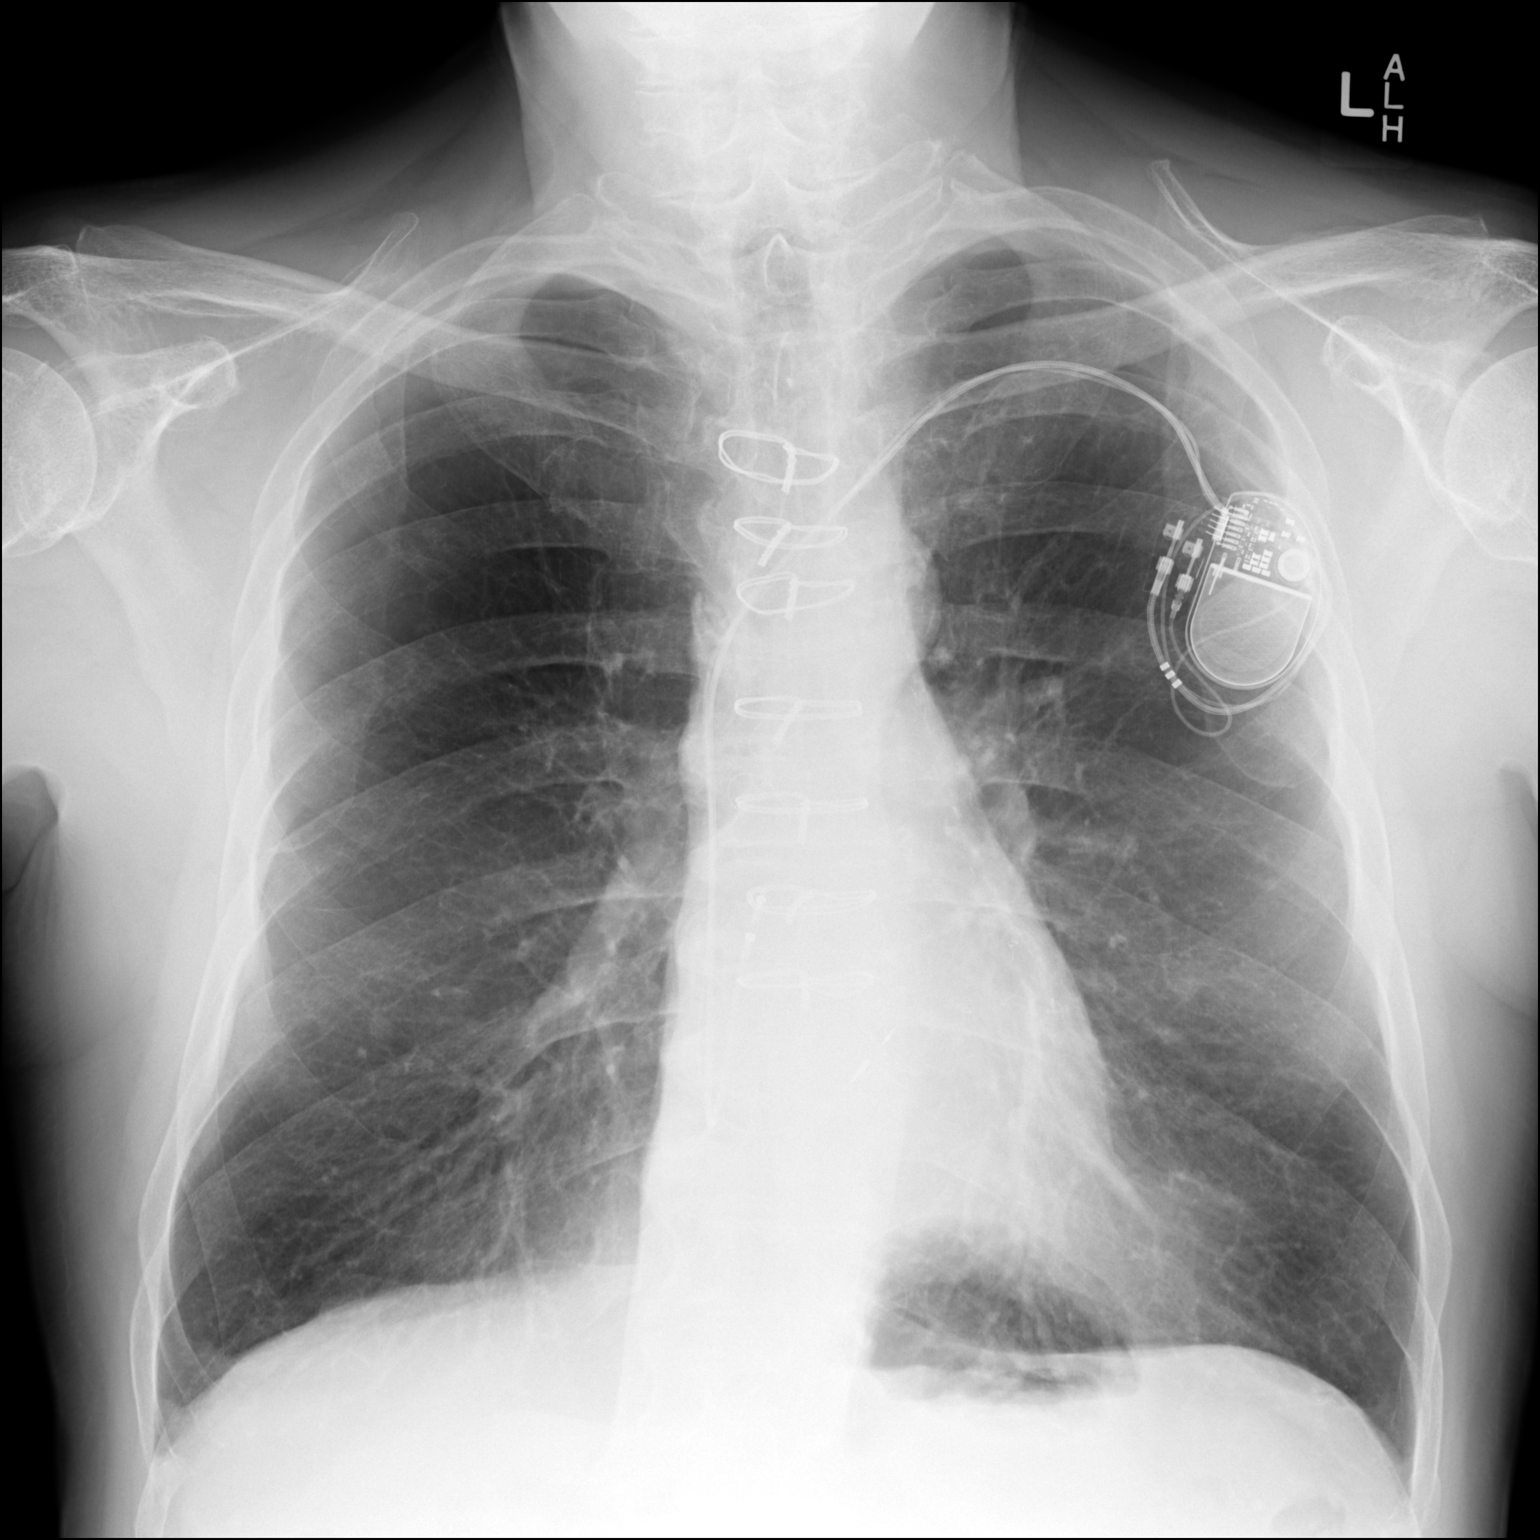

[dg chest 2 view (2 of 2)]
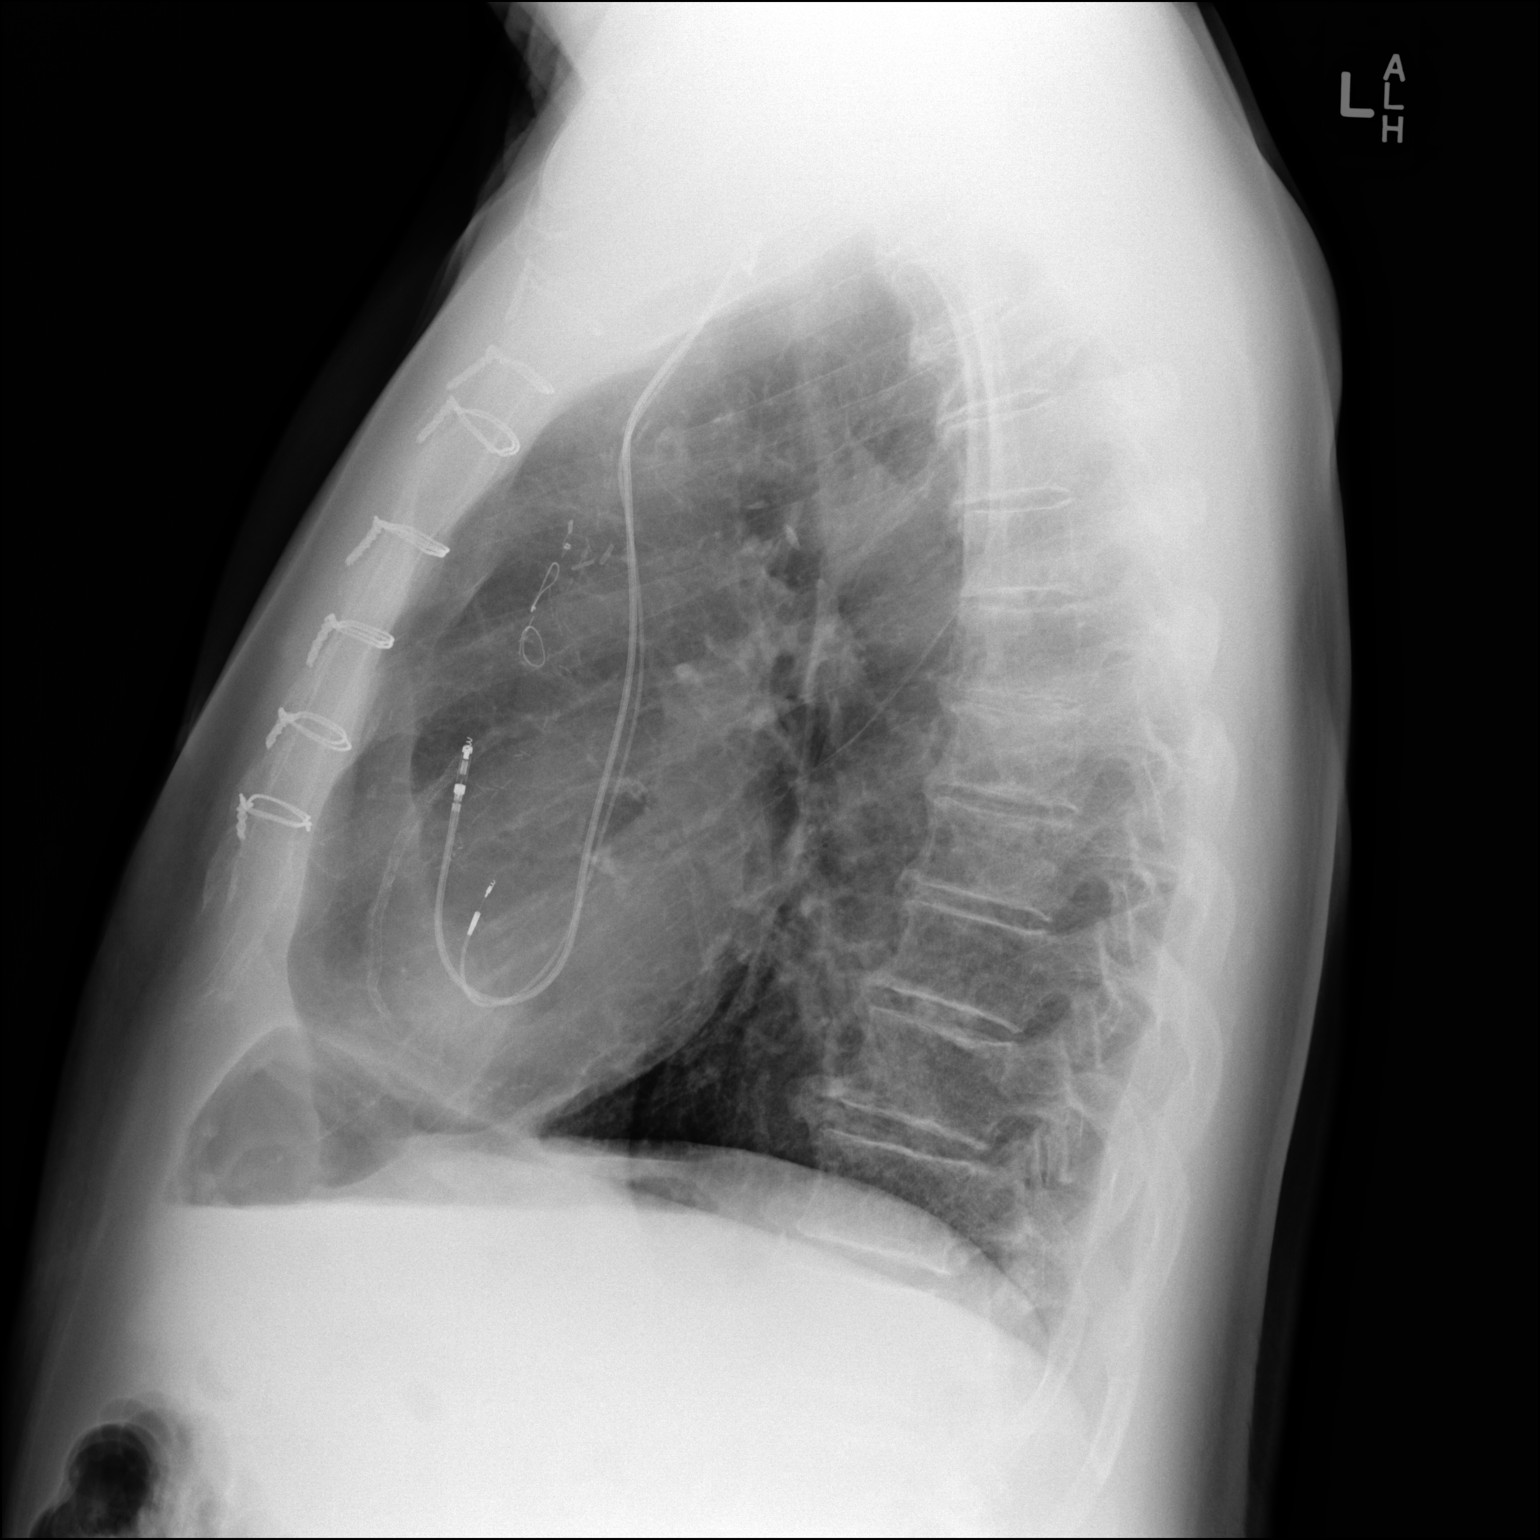

[2 of 2 positions shown; findings below may reference images not displayed]

FINDINGS: Lungs are clear. Heart size and pulmonary vascularity are normal.
Pacemaker leads attached to right heart, unchanged. Status post
coronary artery bypass grafting. No adenopathy. No bone lesions.
IMPRESSION: Lungs clear. Heart size normal. Stable pacemaker lead positioning.
Status post coronary artery bypass grafting.

## 2023-01-28 NOTE — Patient Instructions (Signed)
Medication Instructions:  Your physician recommends that you continue on your current medications as directed. Please refer to the Current Medication list given to you today. Noen *If you need a refill on your cardiac medications before your next appointment, please call your pharmacy*   Lab Work: None If you have labs (blood work) drawn today and your tests are completely normal, you will receive your results only by: MyChart Message (if you have MyChart) OR A paper copy in the mail If you have any lab test that is abnormal or we need to change your treatment, we will call you to review the results.   Testing/Procedures: None   Follow-Up: At [facility], you and your health needs are our priority.  As part of our continuing mission to provide you with exceptional heart care, we have created designated Provider Care Teams.  These Care Teams include your primary Cardiologist (physician) and Advanced Practice Providers (APPs -  Physician Assistants and Nurse Practitioners) who all work together to provide you with the care you need, when you need it.  We recommend signing up for the patient portal called "MyChart".  Sign up information is provided on this After Visit Summary.  MyChart is used to connect with patients for Virtual Visits (Telemedicine).  Patients are able to view lab/test results, encounter notes, upcoming appointments, etc.  Non-urgent messages can be sent to your provider as well.   To learn more about what you can do with MyChart, go to [email or web address].    Your next appointment:   1 year(s)  Provider:   Lewayne Bunting, MD    Other Instructions

## 2023-01-28 NOTE — Progress Notes (Signed)
HPI [name] returns today for followup of CHB, s/p PPM insertion, HTN, and CAD. He has had mild non-exertional chest pressure. He has undergone CABG about 30 months ago . He notes that his bp is normal at home.  Allergies  Allergen Reactions   Amlodipine Besylate Other (See Comments)    Headache, weakness, dizziness   Propofol Other (See Comments)    Heart rate dropped     Lisinopril Cough and Rash   Rosuvastatin Other (See Comments)     Corncern for muscle aches but he is tolerating now      Current Outpatient Medications  Medication Sig Dispense Refill   acetaminophen (TYLENOL) 500 MG tablet Take 1,000 mg by mouth every 6 (six) hours as needed (pain).      albuterol (VENTOLIN HFA) 108 (90 Base) MCG/ACT inhaler INHALE 2 PUFFS INTO THE LUNGS EVERY 6 HOURS AS NEEDED FOR WHEEZE OR SHORTNESS OF BREATH 8.5 each 3   aspirin EC 81 MG tablet Take 1 tablet (81 mg total) by mouth daily. Swallow whole.     Blood Glucose Calibration (ONETOUCH VERIO) High LIQD      cyclobenzaprine (FLEXERIL) 10 MG tablet Take 10 mg by mouth at bedtime.     ezetimibe (ZETIA) 10 MG tablet TAKE 1 TABLET BY MOUTH EVERY DAY 90 tablet 3   famotidine (PEPCID) 20 MG tablet Take 20 mg by mouth daily as needed for heartburn.     fenofibrate (TRICOR) 145 MG tablet Take 1 tablet (145 mg total) by mouth daily. 90 tablet 3   hydrochlorothiazide (HYDRODIURIL) 25 MG tablet TAKE 1/2 TABLET (12.5 MG TOTAL) BY MOUTH DAILY AS NEEDED. (Patient taking differently: Take 12.5 mg by mouth daily.) 90 tablet 1   ipratropium (ATROVENT) 0.06 % nasal spray PLACE 2 SPRAYS INTO BOTH NOSTRILS 4 (FOUR) TIMES DAILY AS DIRECTED (Patient taking differently: Place 2 sprays into both nostrils 4 (four) times daily as needed for rhinitis.) 13 mL 11   metoprolol tartrate (LOPRESSOR) 25 MG tablet TAKE 1/2 TABLET TWICE A DAY BY MOUTH 90 tablet 3   nitroGLYCERIN (NITROSTAT) 0.4 MG SL tablet Place 1 tablet (0.4 mg total) under the tongue every 5  (five) minutes as needed for chest pain. 25 tablet 6   omeprazole (PRILOSEC) 40 MG capsule Take 1 capsule (40 mg total) by mouth daily. (Patient taking differently: Take 40 mg by mouth daily as needed (acid reflux).) 90 capsule 3   ONETOUCH VERIO test strip 1 each daily.     Pharmacist Choice Lancets MISC      rosuvastatin (CRESTOR) 20 MG tablet TAKE 1 TABLET BY MOUTH EVERY DAY 90 tablet 3   sildenafil (VIAGRA) 100 MG tablet Take 1 tablet by mouth prior to sexual activity as needed 15 tablet 11   No current facility-administered medications for this visit.     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    noted on EKG March 2013   CAD (coronary artery disease)    s/p atherectomy, PTCA/DES x2 mid & distal RCA 06/30/12   Carotid artery occlusion    Chest pain    Complication of anesthesia    heart rate dropping with propofol last procedure   COPD (chronic obstructive pulmonary disease) (HCC)    Dyspnea    ED (erectile dysfunction)    Esophageal stricture    External hemorrhoids without mention of complication    GERD (gastroesophageal  reflux disease)    Hiatal hernia    History of kidney stones    HLD (hyperlipidemia)    HTN (hypertension)    Myocardial infarction [facility])    1996   Personal history of colonic polyps 03/12/2010   TUBULAR ADENOMA   Pneumonia    Presence of permanent cardiac pacemaker    Sleep apnea    wears CPAP   Tobacco abuse    Tubular adenoma of colon    Type 2 diabetes mellitus (HCC)     ROS:   All systems reviewed and negative except as noted in the HPI.   Past Surgical History:  Procedure Laterality Date   ANGIOPLASTY     X3   ANTERIOR CERVICAL DECOMP/DISCECTOMY FUSION N/A 11/25/2022   Procedure: Anterior Cervical Decompression/Discectomy Fusion Cervical Three-Four;  Surgeon: Bedelia Person, MD;  Location: [facility] OR;  Service: Neurosurgery;  Laterality: N/A;   CARDIAC CATHETERIZATION     8 years ago  stents   COLONOSCOPY N/A 02/16/2017   Procedure: COLONOSCOPY;  Surgeon: Beverley Fiedler, MD;  Location: [facility];  Service: Gastroenterology;  Laterality: N/A;   COLONOSCOPY     CORONARY ARTERY BYPASS GRAFT N/A 07/19/2020   Procedure: CORONARY ARTERY BYPASS GRAFTING (CABG) TIMES THREE USING LEFT INTERNAL MAMMARY ARTERY AND RIGHT GREATER SAPHENOUS VEIN HARVESTED ENDOSCOPICALLY;  Surgeon: Alleen Borne, MD;  Location: [facility] OR;  Service: Open Heart Surgery;  Laterality: N/A;   INSERT / REPLACE / REMOVE PACEMAKER  06/20/2018   LEFT HEART CATH AND CORONARY ANGIOGRAPHY N/A 11/05/2016   Procedure: Left Heart Cath and Coronary Angiography;  Surgeon: Yvonne Kendall, MD;  [facility] LAB;  Service: Cardiovascular;  Laterality: N/A;   LEFT HEART CATH AND CORONARY ANGIOGRAPHY N/A 07/12/2020   Procedure: LEFT HEART CATH AND CORONARY ANGIOGRAPHY;  Surgeon: Kathleene Hazel, MD;  [facility]: [facility] LAB;  Service: Cardiovascular;  Laterality: N/A;   LUMBAR LAMINECTOMY/DECOMPRESSION MICRODISCECTOMY N/A 05/12/2018   Procedure: Microlumbar decompression L2-3, possible L1-2;  Surgeon: Jene Every, MD;  Location: [facility] OR;  Service: Orthopedics;  Laterality: N/A;  2 hrs, Microlumbar decompression L2-3, possible L1-2   PACEMAKER IMPLANT N/A 06/20/2018   Procedure: PACEMAKER IMPLANT;  Surgeon: Marinus Maw, MD;  [facility]: [facility] LAB;  Service: Cardiovascular;  Laterality: N/A;   PERCUTANEOUS CORONARY ROTOBLATOR INTERVENTION (PCI-R) N/A 06/30/2012   Procedure: PERCUTANEOUS CORONARY ROTOBLATOR INTERVENTION (PCI-R);  Surgeon: Kathleene Hazel, MD;  Location: [facility] CATH LAB;  Service: Cardiovascular;  Laterality: N/A;   TEE WITHOUT CARDIOVERSION N/A 07/19/2020   Procedure: TRANSESOPHAGEAL ECHOCARDIOGRAM (TEE);  Surgeon: Alleen Borne, MD;  Location: [facility] OR;  Service: Open Heart Surgery;  Laterality: N/A;   VIDEO BRONCHOSCOPY  08/13/2011   Procedure: VIDEO BRONCHOSCOPY WITHOUT  FLUORO;  Surgeon: Nyoka Cowden, MD;  Location: [facility];  Service: Cardiopulmonary;  Laterality: Bilateral;     Family History  Problem Relation Age of Onset   Diabetes Mother        and brother    Kidney failure Mother    Heart disease Father        hx of MI   Breast cancer Sister    Colon cancer Neg Hx    Stomach cancer Neg Hx    Esophageal cancer Neg Hx    Rectal cancer Neg Hx      Social History   Socioeconomic History   Marital status: Married    Spouse name: Not on file   Number of children: 1  Years of education: Not on file   Highest education level: Not on file  Occupational History   Occupation: Regulatory affairs officer  Tobacco Use   Smoking status: Former    Current packs/day: 0.00    Average packs/day: 0.5 packs/day for 40.0 years (20.0 ttl pk-yrs)    Types: Cigarettes    Start date: 08/25/1971    Quit date: 08/25/2011    Years since quitting: 11.4   Smokeless tobacco: Never  Vaping Use   Vaping status: Never Used  Substance and Sexual Activity   Alcohol use: Not Currently   Drug use: No   Sexual activity: Not Currently  Other Topics Concern   Not on file  Social History Narrative   Not on file   Social Determinants of Health   Financial Resource Strain: Not on file  Food Insecurity: Low Risk  (07/16/2022)   [facility]   Hunger Vital Sign    Worried About Running Out of Food in the Last Year: Never true    Ran Out of Food in the Last Year: Never true  Transportation Needs: Not on file (07/16/2022)  Physical Activity: Not on file  Stress: Not on file  Social Connections: Not on file  Intimate Partner Violence: Not on file     BP (!) 150/92   Pulse 70   Ht 5\' 10"  (1.778 m)   Wt 186 lb (84.4 kg)   SpO2 96%   BMI 26.69 kg/m   Physical Exam:  Well appearing NAD HEENT: Unremarkable Neck:  No JVD, no thyromegally Lymphatics:  No adenopathy Back:  No CVA tenderness Lungs:  Clear HEART:  Regular rate  rhythm, no murmurs, no rubs, no clicks Abd:  soft, positive bowel sounds, no organomegally, no rebound, no guarding Ext:  2 plus pulses, no edema, no cyanosis, no clubbing Skin:  No rashes no nodules Neuro:  CN II through XII intact, motor grossly intact  DEVICE  Normal device function.  See PaceArt for details.   Assess/Plan: PAF - he has had post op atrial fib but none since. We will follow 2. CHB - he is s/p PPM and pacing 99% in the ventricle. 3. PPM -his st jude DDD PPM is working normally. 4. CAD - he has done well s/p cabg.  5. Dyslipidemia - he will continue statin therapy.   Sharlot Gowda Sanjiv Castorena,MD

## 2023-02-03 ENCOUNTER — Other Ambulatory Visit: Payer: Self-pay | Admitting: Internal Medicine

## 2023-02-18 DIAGNOSIS — M5451 Vertebrogenic low back pain: Secondary | ICD-10-CM | POA: Diagnosis not present

## 2023-02-19 ENCOUNTER — Other Ambulatory Visit: Payer: Self-pay | Admitting: Specialist

## 2023-02-19 DIAGNOSIS — M545 Low back pain, unspecified: Secondary | ICD-10-CM

## 2023-02-23 ENCOUNTER — Other Ambulatory Visit: Payer: Self-pay | Admitting: Cardiovascular Disease

## 2023-03-01 ENCOUNTER — Other Ambulatory Visit: Payer: Self-pay | Admitting: Internal Medicine

## 2023-03-01 DIAGNOSIS — X32XXXD Exposure to sunlight, subsequent encounter: Secondary | ICD-10-CM | POA: Diagnosis not present

## 2023-03-01 DIAGNOSIS — Z85828 Personal history of other malignant neoplasm of skin: Secondary | ICD-10-CM | POA: Diagnosis not present

## 2023-03-01 DIAGNOSIS — D225 Melanocytic nevi of trunk: Secondary | ICD-10-CM | POA: Diagnosis not present

## 2023-03-01 DIAGNOSIS — Z08 Encounter for follow-up examination after completed treatment for malignant neoplasm: Secondary | ICD-10-CM | POA: Diagnosis not present

## 2023-03-01 DIAGNOSIS — L570 Actinic keratosis: Secondary | ICD-10-CM | POA: Diagnosis not present

## 2023-03-02 ENCOUNTER — Ambulatory Visit
Admission: RE | Admit: 2023-03-02 | Discharge: 2023-03-02 | Disposition: A | Discharge status: Home / Self Care | Payer: Medicare Other | Source: Ambulatory Visit | Attending: Specialist | Admitting: Specialist

## 2023-03-02 DIAGNOSIS — M545 Low back pain, unspecified: Secondary | ICD-10-CM

## 2023-03-02 DIAGNOSIS — M5126 Other intervertebral disc displacement, lumbar region: Secondary | ICD-10-CM | POA: Diagnosis not present

## 2023-03-02 DIAGNOSIS — M47816 Spondylosis without myelopathy or radiculopathy, lumbar region: Secondary | ICD-10-CM | POA: Diagnosis not present

## 2023-03-09 DIAGNOSIS — M5451 Vertebrogenic low back pain: Secondary | ICD-10-CM | POA: Diagnosis not present

## 2023-03-22 DIAGNOSIS — M5416 Radiculopathy, lumbar region: Secondary | ICD-10-CM | POA: Diagnosis not present

## 2023-04-01 ENCOUNTER — Ambulatory Visit: Payer: Medicare Other | Admitting: Internal Medicine

## 2023-04-01 ENCOUNTER — Encounter: Payer: Self-pay | Admitting: Internal Medicine

## 2023-04-01 ENCOUNTER — Ambulatory Visit (INDEPENDENT_AMBULATORY_CARE_PROVIDER_SITE_OTHER): Payer: Medicare Other

## 2023-04-01 VITALS — BP 120/70 | HR 79 | Ht 70.0 in | Wt 185.6 lb

## 2023-04-01 DIAGNOSIS — E039 Hypothyroidism, unspecified: Secondary | ICD-10-CM | POA: Diagnosis not present

## 2023-04-01 DIAGNOSIS — E7849 Other hyperlipidemia: Secondary | ICD-10-CM

## 2023-04-01 DIAGNOSIS — E1165 Type 2 diabetes mellitus with hyperglycemia: Secondary | ICD-10-CM | POA: Diagnosis not present

## 2023-04-01 DIAGNOSIS — I442 Atrioventricular block, complete: Secondary | ICD-10-CM

## 2023-04-01 LAB — CUP PACEART REMOTE DEVICE CHECK
Battery Remaining Longevity: 54 mo
Battery Remaining Percentage: 52 %
Battery Voltage: 2.98 V
Brady Statistic AP VP Percent: 4 %
Brady Statistic AP VS Percent: 1 %
Brady Statistic AS VP Percent: 95 %
Brady Statistic AS VS Percent: 1 %
Brady Statistic RA Percent Paced: 3.2 %
Brady Statistic RV Percent Paced: 99 %
Date Time Interrogation Session: 20241212020015
Implantable Lead Connection Status: 753985
Implantable Lead Connection Status: 753985
Implantable Lead Implant Date: 20200302
Implantable Lead Implant Date: 20200302
Implantable Lead Location: 753859
Implantable Lead Location: 753860
Implantable Lead Model: 3830
Implantable Pulse Generator Implant Date: 20200302
Lead Channel Impedance Value: 480 Ohm
Lead Channel Impedance Value: 490 Ohm
Lead Channel Pacing Threshold Amplitude: 0.5 V
Lead Channel Pacing Threshold Amplitude: 0.75 V
Lead Channel Pacing Threshold Pulse Width: 0.5 ms
Lead Channel Pacing Threshold Pulse Width: 0.5 ms
Lead Channel Sensing Intrinsic Amplitude: 12 mV
Lead Channel Sensing Intrinsic Amplitude: 2.3 mV
Lead Channel Setting Pacing Amplitude: 2 V
Lead Channel Setting Pacing Amplitude: 2.5 V
Lead Channel Setting Pacing Pulse Width: 0.5 ms
Lead Channel Setting Sensing Sensitivity: 4 mV
Pulse Gen Model: 2272
Pulse Gen Serial Number: 9114136

## 2023-04-01 LAB — POCT GLYCOSYLATED HEMOGLOBIN (HGB A1C): Hemoglobin A1C: 7.5 % — AB (ref 4.0–5.6)

## 2023-04-01 NOTE — Progress Notes (Signed)
Patient ID: Daniel Carey, male   DOB: 23-Jul-1950, 72 y.o.   MRN: [id number]  HPI  Daniel Carey is a 72 y.o.-year-old male, returning for f/u for hypothyroidism, dx 2012 and DM2, diet-controlled, non-insulin-dependent, with complications (CAD - s/p CABG 07/2020, carotid atherosclerosis).  Last visit 4 months ago.  Interim history: No increased urination, blurry vision, nausea. At the beginning of the year, he had cervical vertebral fracture >> had cervical decompression surgery 11/25/2022.  He recovered well after surgery, but he still has not been able to exercise. He c/o cold feet.  Pt. was dx with hypothyroidism in 2012 >> he was on levothyroxine up to 200 mcg in 2016 >> but did not feel well so he stopped. Afterwards, his TFTs fluctuated but in the last 3 years they were mostly in the normal range.  Reviewed previous TFTs: Lab Results  Component Value Date   TSH 5.14 05/29/2022   TSH 3.73 06/25/2021   TSH 5.070 (H) 03/07/2021   TSH 3.96 03/20/2020   TSH 3.83 08/22/2019   TSH 3.99 01/31/2018   TSH 4.50 08/02/2017   TSH 4.89 (H) 12/08/2016   TSH 4.13 03/06/2016   TSH 9.84 (H) 09/05/2015   FREET4 0.85 05/29/2022   FREET4 1.04 03/07/2021   FREET4 0.75 03/20/2020   FREET4 0.80 01/31/2018   FREET4 0.65 08/02/2017   FREET4 0.83 12/08/2016   FREET4 0.67 03/06/2016   FREET4 0.66 09/05/2015   FREET4 1.25 05/07/2015   FREET4 0.79 08/02/2014    Lab Results  Component Value Date   T3FREE 2.6 05/29/2022   T3FREE 2.7 03/07/2021   T3FREE 3.0 03/20/2020   T3FREE 2.8 01/31/2018   T3FREE 2.6 08/02/2017   T3FREE 3.3 12/08/2016   T3FREE 3.1 03/06/2016   T3FREE 3.2 09/05/2015   T3FREE 3.9 05/07/2015   T3FREE 2.9 08/02/2014   Pt denies: - feeling nodules in neck - hoarseness - dysphagia - choking   DM2:  Reviewed HbA1c levels: Lab Results  Component Value Date   HGBA1C 7.4 (H) 11/19/2022   HGBA1C 6.8 (A) 05/29/2022   HGBA1C 6.8 (H) 11/11/2021  07/03/2016: HbA1c  6.8%  He was on: - Metformin 500 mg 2x a day >> stopped 2/2 nausea  - Metformin ER 1000 mg with dinner >> nausea  Plan on: - Metformin ER 500 mg twice a day with meals >> 3x a day -he had nausea with taking 1000 mg at the same time >> stopped 08/2021  He continues to limit sweets and to not eat fried foods.  However, he is eating cereal without milk as a snack in the evening. He usually also goes to the gym-[employer] sne[employer].  He checks his sugars 0-1x a day - only has 6 readings: - am: 100-165 >> 119 >> 135 >> 132, 133, 149 >> 143 - 2h after b'fast: n/c >> 119, 144 >> n/c >> 148, 162 >> 170 - lunch: 120s >> 80, 96-117 >> 106-111 >> 97, 113 >> n/c - 2h after lunch: 101, 110, 118 >> 101 >> 81, 126, 173 >> 100-146 - dinner:  90s-120 >> 94, 114  >> 97-115, 146 >> 110 - 2h after dinner: n/c - bedtime: n/c Feels sugars <80. Lowest: 94 >> 101 >> 81 >> 90. Highest: 144 >> 200 >> 173 >> 170s.  Meter: ReliOn >> ReliOn Premier Blu >> Verio Reflect  + CKD. Last BUN/Cr: Lab Results  Component Value Date   BUN 21 11/16/2022   BUN 22 06/25/2021   BUN 18  11/11/2020   CREATININE 1.31 (H) 11/16/2022   CREATININE 1.20 06/25/2021   CREATININE 1.23 11/11/2020   Lab Results  Component Value Date   MICRALBCREAT 2.1 12/22/2022   MICRALBCREAT 3.2 07/14/2021  He is off ACE inhibitor/ARB due to history of high potassium.  Latest potassium levels have been normal: Lab Results  Component Value Date   K 4.6 11/16/2022   K 4.6 06/25/2021   K 4.8 11/11/2020   K 4.3 07/23/2020   K 4.0 07/22/2020   + HL: Lab Results  Component Value Date   CHOL 129 11/16/2022   HDL 53 11/16/2022   LDLCALC 58 11/16/2022   LDLDIRECT 107.0 02/27/2013   TRIG 95 11/16/2022   CHOLHDL 2.4 11/16/2022  On Crestor 20, changed from Niacin >> Zetia 10 mg daily. Also on fenofibrate 145 mg daily - restarted 04/2022.  Last eye exam in 2024: reportedly No DR, no glaucoma, + incipient cataracts.  Last foot exam  11/2022.  He sees Dr Elease Hashimoto >> had angioplasy x3 in the past, and 2 stents placed. He he has a history of an AMI 42 (mild). He has a Mobitz 1 AVB. Had a catheterization in 10/2016 >> partial blockages. He has OSA and wears a CPAP. He had a colonoscopy 02/2017 >> E coli sepsis. He usually stays active playing golf, but not since his CABG.  ROS: + See HPI  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    noted on EKG March 2013   CAD (coronary artery disease)    s/p atherectomy, PTCA/DES x2 mid & distal RCA 06/30/12   Carotid artery occlusion    Chest pain    Complication of anesthesia    heart rate dropping with propofol last procedure   COPD (chronic obstructive pulmonary disease) (HCC)    Dyspnea    ED (erectile dysfunction)    Esophageal stricture    External hemorrhoids without mention of complication    GERD (gastroesophageal reflux disease)    Hiatal hernia    History of kidney stones    HLD (hyperlipidemia)    HTN (hypertension)    Myocardial infarction (HCC)    1996   Personal history of colonic polyps 03/12/2010   TUBULAR ADENOMA   Pneumonia    Presence of permanent cardiac pacemaker    Sleep apnea    wears CPAP   Tobacco abuse    Tubular adenoma of colon    Type 2 diabetes mellitus (HCC)    Past Surgical History:  Procedure Laterality Date   ANGIOPLASTY     X3   ANTERIOR CERVICAL DECOMP/DISCECTOMY FUSION N/A 11/25/2022   Procedure: Anterior Cervical Decompression/Discectomy Fusion Cervical Three-Four;  Surgeon: Bedelia Person, MD;  Location: [facility] OR;  Service: Neurosurgery;  Laterality: N/A;   CARDIAC CATHETERIZATION     8 years ago stents   COLONOSCOPY N/A 02/16/2017   Procedure: COLONOSCOPY;  Surgeon: Beverley Fiedler, MD;  Location: [facility] ENDOSCOPY;  Service: Gastroenterology;  Laterality: N/A;   COLONOSCOPY     CORONARY ARTERY BYPASS GRAFT N/A 07/19/2020   Procedure: CORONARY ARTERY BYPASS GRAFTING (CABG) TIMES  THREE USING LEFT INTERNAL MAMMARY ARTERY AND RIGHT GREATER SAPHENOUS VEIN HARVESTED ENDOSCOPICALLY;  Surgeon: Alleen Borne, MD;  Location: [facility] OR;  Service: Open Heart Surgery;  Laterality: N/A;   INSERT / REPLACE / REMOVE PACEMAKER  06/20/2018   LEFT HEART CATH AND CORONARY ANGIOGRAPHY N/A 11/05/2016   Procedure: Left  Heart Cath and Coronary Angiography;  Surgeon: Yvonne Kendall, MD;  Location: [facility] INVASIVE CV LAB;  Service: Cardiovascular;  Laterality: N/A;   LEFT HEART CATH AND CORONARY ANGIOGRAPHY N/A 07/12/2020   Procedure: LEFT HEART CATH AND CORONARY ANGIOGRAPHY;  Surgeon: Kathleene Hazel, MD;  Location: [facility] INVASIVE CV LAB;  Service: Cardiovascular;  Laterality: N/A;   LUMBAR LAMINECTOMY/DECOMPRESSION MICRODISCECTOMY N/A 05/12/2018   Procedure: Microlumbar decompression L2-3, possible L1-2;  Surgeon: Jene Every, MD;  Location: [facility] OR;  Service: Orthopedics;  Laterality: N/A;  2 hrs, Microlumbar decompression L2-3, possible L1-2   PACEMAKER IMPLANT N/A 06/20/2018   Procedure: PACEMAKER IMPLANT;  Surgeon: Marinus Maw, MD;  Location: [facility] INV[facility];  Service: Cardiovascular;  Laterality: N/A;   PERCUTANEOUS CORONARY ROTOBLATOR INTERVENTION (PCI-R) N/A 06/30/2012   Procedure: PERCUTANEOUS CORONARY ROTOBLATOR INTERVENTION (PCI-R);  Surgeon: Kathleene Hazel, MD;  Location: [facility] CATH LAB;  Service: Cardiovascular;  Laterality: N/A;   TEE WITHOUT CARDIOVERSION N/A 07/19/2020   Procedure: TRANSESOPHAGEAL ECHOCARDIOGRAM (TEE);  Surgeon: Alleen Borne, MD;  Location: [facility] OR;  Service: Open Heart Surgery;  Laterality: N/A;   VIDEO BRONCHOSCOPY  08/13/2011   Procedure: VIDEO BRONCHOSCOPY WITHOUT FLUORO;  Surgeon: Nyoka Cowden, MD;  Location: [facility] ENDOSCOPY;  Service: Cardiopulmonary;  Laterality: Bilateral;   History   Social History   Marital Status: Married    Spouse Name: N/A    Number of Children: 1   Occupational History   Regulatory affairs officer    Social History  Main Topics   Smoking status: Former Smoker -- 0.50 packs/day for 40 years    Types: Cigarettes    Quit date: 08/25/2011   Smokeless tobacco: Never Used   Alcohol Use: 0.0 oz/week     Comment: rare   Drug Use: No   Current Outpatient Medications on File Prior to Visit  Medication Sig Dispense Refill   acetaminophen (TYLENOL) 500 MG tablet Take 1,000 mg by mouth every 6 (six) hours as needed (pain).      albuterol (VENTOLIN HFA) 108 (90 Base) MCG/ACT inhaler INHALE 2 PUFFS INTO THE LUNGS EVERY 6 HOURS AS NEEDED FOR WHEEZE OR SHORTNESS OF BREATH 8.5 each 3   aspirin EC 81 MG tablet Take 1 tablet (81 mg total) by mouth daily. Swallow whole.     Blood Glucose Calibration (ONETOUCH VERIO) High LIQD      cyclobenzaprine (FLEXERIL) 10 MG tablet Take 10 mg by mouth at bedtime.     ezetimibe (ZETIA) 10 MG tablet TAKE 1 TABLET BY MOUTH EVERY DAY 90 tablet 3   famotidine (PEPCID) 20 MG tablet Take 20 mg by mouth daily as needed for heartburn.     fenofibrate (TRICOR) 145 MG tablet Take 1 tablet (145 mg total) by mouth daily. 90 tablet 3   hydrochlorothiazide (HYDRODIURIL) 25 MG tablet TAKE 1/2 TABLET (12.5 MG TOTAL) BY MOUTH DAILY AS NEEDED. 45 tablet 3   ipratropium (ATROVENT) 0.06 % nasal spray PLACE 2 SPRAYS INTO BOTH NOSTRILS 4 (FOUR) TIMES DAILY AS DIRECTED (Patient taking differently: Place 2 sprays into both nostrils 4 (four) times daily as needed for rhinitis.) 13 mL 11   metoprolol tartrate (LOPRESSOR) 25 MG tablet Take 1 tablet (25 mg total) by mouth 2 (two) times daily. 90 tablet 3   nitroGLYCERIN (NITROSTAT) 0.4 MG SL tablet Place 1 tablet (0.4 mg total) under the tongue every 5 (five) minutes as needed for chest pain. 25 tablet 6   omeprazole (PRILOSEC) 40 MG capsule Take 1  capsule (40 mg total) by mouth daily. (Patient taking differently: Take 40 mg by mouth daily as needed (acid reflux).) 90 capsule 3   ONETOUCH VERIO test strip 1 each daily.     Pharmacist Choice Lancets MISC       rosuvastatin (CRESTOR) 20 MG tablet TAKE 1 TABLET BY MOUTH EVERY DAY 90 tablet 3   sildenafil (VIAGRA) 100 MG tablet Take 1 tablet by mouth prior to sexual activity as needed 15 tablet 11   No current facility-administered medications on file prior to visit.   Allergies  Allergen Reactions   Amlodipine Besylate Other (See Comments)    Headache, weakness, dizziness   Propofol Other (See Comments)    Heart rate dropped     Lisinopril Cough and Rash   Rosuvastatin Other (See Comments)     Corncern for muscle aches but he is tolerating now    Family History  Problem Relation Age of Onset   Diabetes Mother        and brother    Kidney failure Mother    Heart disease Father        hx of MI   Breast cancer Sister    Colon cancer Neg Hx    Stomach cancer Neg Hx    Esophageal cancer Neg Hx    Rectal cancer Neg Hx    PE: BP 120/70   Pulse 79   Ht 5\' 10"  (1.778 m)   Wt 185 lb 9.6 oz (84.2 kg)   SpO2 96%   BMI 26.63 kg/m  Wt Readings from Last 3 Encounters:  04/01/23 185 lb 9.6 oz (84.2 kg)  01/28/23 186 lb (84.4 kg)  12/22/22 183 lb 9.6 oz (83.3 kg)   Constitutional: Slightly overweight, in NAD Eyes: no exophthalmos ENT: no masses palpated in neck, no cervical lymphadenopathy Cardiovascular: RRR, No MRG Respiratory: CTA B Musculoskeletal: no deformities Skin:  no rashes Neurological: no tremor with outstretched hands Diabetic Foot Exam - Simple   Simple Foot Form Diabetic Foot exam was performed with the following findings: Yes 04/01/2023 10:12 AM  Visual Inspection No deformities, no ulcerations, no other skin breakdown bilaterally: Yes Sensation Testing Intact to touch and monofilament testing bilaterally: Yes Pulse Check Posterior Tibialis and Dorsalis pulse intact bilaterally: Yes Comments B onychodystrophy with thick nails    ASSESSMENT: 1. Mild hypothyroidism  2. DM2, diet- controlled - CAD, s/p CABG 07/19/2020 - Carotid atherosclerosis - per carotid  ultrasound 08/2021  3. HL  PLAN:  1. Patient with history of mild hypothyroidism, not on levothyroxine -Latest TSH was normal: Lab Results  Component Value Date   TSH 5.14 05/29/2022  -He would prefer to continue without levothyroxine if possible -He has no complaints at today's visit and has good energy -We will recheck his TFTs now  2. DM2 -Patient with uncontrolled type 2 diabetes, with worse control in spring/2022 when HbA1c returned elevated, at 7.3%.  At that time, he was started on low-dose metformin in the hospital which he could not tolerate due to nausea.  He did tolerate the metformin ER at a lower dose well, but stopped in 2023.  He started to improve his diet and be more consistent with exercise.  HbA1c decreased to 6.8%.  However, before last visit HbA1c increased again to 7.4%.  Sugars appears to be mostly at goal but with slightly higher numbers in the morning.  He attributed his higher HbA1c to the lack of activity and increased pain before his cervical surgery.  He  was planning to increase activity and resume exercise so we did not change his regimen especially as he was also reticent to increase his medication burden.  I did recommend to keep a close eye on his sugars and check daily, rotating check times. -At today's visit, he is still not checking sugars consistently but he does have sugars above target when he checks.  Also, HbA1c is higher.  He is not happy due to his previous back surgery but we discussed that continuing poor diabetes control can worsen his cardiovascular and renal outcomes, so I would not recommend to continue without treatment.  In fact, I suggested an SGLT2 inhibitor.  We discussed about the benefits of this class of medications.  He declines these at the moment as he feels that he is taking too many medications.  He plans to restart exercise soon.  I strongly advised him to think about it and let me know if he wanted to try them before next visit. Patient  Instructions   Check blood sugars 1x a day, rotating check times.  I would suggest [place] or Jardiance.  Please stop at the lab.  Please return in 4 months with your sugar log.   - we checked his HbA1c: 7.5% (higher) - advised to check sugars at different times of the day - 1x a day, rotating check times - advised for yearly eye exams >> he is UTD - return to clinic in 4 months  3.  Hyperlipidemia -Reviewed latest lipid panel from 10/2022: LDL slightly higher than our goal of less than 55, but otherwise fractions at goal: Lab Results  Component Value Date   CHOL 129 11/16/2022   HDL 53 11/16/2022   LDLCALC 58 11/16/2022   LDLDIRECT 107.0 02/27/2013   TRIG 95 11/16/2022   CHOLHDL 2.4 11/16/2022  -He continues on Crestor 20 mg daily, fenofibrate 145 mg daily and Zetia 10 mg daily without side effects  Component     Latest Ref Rng 04/01/2023  Hemoglobin A1C     4.0 - 5.6 % 7.5 !   TSH     0.40 - 4.50 mIU/L 3.79   Triiodothyronine,Free,Serum     2.3 - 4.2 pg/mL 3.5   T4,Free(Direct)     0.8 - 1.8 ng/dL 1.0   Thyroid labs are normal.  Carlus Pavlov, MD PhD [facility] Endocrinology

## 2023-04-01 NOTE — Patient Instructions (Addendum)
  Check blood sugars 1x a day, rotating check times.  I would suggest [place] or Jardiance.  Please stop at the lab.  Please return in 4 months with your sugar log.

## 2023-04-02 LAB — TSH: TSH: 3.79 m[IU]/L (ref 0.40–4.50)

## 2023-04-02 LAB — T3, FREE: T3, Free: 3.5 pg/mL (ref 2.3–4.2)

## 2023-04-02 LAB — T4, FREE: Free T4: 1 ng/dL (ref 0.8–1.8)

## 2023-04-06 ENCOUNTER — Ambulatory Visit: Payer: Medicare Other | Admitting: Internal Medicine

## 2023-04-12 DIAGNOSIS — M5451 Vertebrogenic low back pain: Secondary | ICD-10-CM | POA: Diagnosis not present

## 2023-04-19 DIAGNOSIS — M5412 Radiculopathy, cervical region: Secondary | ICD-10-CM | POA: Diagnosis not present

## 2023-04-20 ENCOUNTER — Telehealth: Payer: Self-pay | Admitting: Internal Medicine

## 2023-04-20 DIAGNOSIS — E1165 Type 2 diabetes mellitus with hyperglycemia: Secondary | ICD-10-CM

## 2023-04-20 MED ORDER — ONETOUCH VERIO VI STRP: ORAL_STRIP | 5 refills | Status: DC

## 2023-04-20 MED ORDER — PHARMACIST CHOICE LANCETS MISC: 5 refills | Status: AC

## 2023-04-20 MED ORDER — ONETOUCH VERIO REFLECT W/DEVICE KIT: PACK | 0 refills | Status: DC

## 2023-04-20 MED ORDER — ONETOUCH VERIO HIGH VI LIQD: 1.0000 | Freq: Once | 0 refills | Status: AC

## 2023-04-20 MED ORDER — ONETOUCH VERIO HIGH VI LIQD: 1.0000 | Freq: Once | 0 refills | Status: DC

## 2023-04-20 MED ORDER — PHARMACIST CHOICE LANCETS MISC: 5 refills | Status: DC

## 2023-04-20 NOTE — Addendum Note (Signed)
 Addended by: Pollie Meyer on: 04/20/2023 03:33 PM   Modules accepted: Orders

## 2023-04-20 NOTE — Telephone Encounter (Signed)
 Rx was sent to the incorrect location the 1st time. I have re-sent them:  Requested Prescriptions   Signed Prescriptions Disp Refills   Blood Glucose Calibration (ONETOUCH VERIO) High LIQD 1 each 0    Sig: 1 each by In Vitro route once for 1 dose. Use to calibrate glucometer    Authorizing Provider: TRIXIE FILE    Ordering User: CLEOTILDE, Geniyah Eischeid S   Blood Glucose Monitoring Suppl (ONETOUCH VERIO REFLECT) w/Device KIT 1 kit 0    Sig: Use to check blood sugar 1-2 times a day    Authorizing Provider: TRIXIE FILE    Ordering User: Mairen Wallenstein S   ONETOUCH VERIO test strip 200 each 5    Sig: Use to check blood sugar 1-2 times a day    Authorizing Provider: TRIXIE FILE    Ordering User: CLEOTILDE ROLIN RAMAN   Pharmacist Choice Lancets MISC 200 each 5    Sig: Use to check blood sugar 1-2 times a day    Authorizing Provider: TRIXIE FILE    Ordering User: CLEOTILDE ROLIN RAMAN

## 2023-04-20 NOTE — Telephone Encounter (Signed)
 Requested Prescriptions   Signed Prescriptions Disp Refills   ONETOUCH VERIO test strip 200 each 5    Sig: Use to check blood sugar 1-2 times a day    Authorizing Provider: TRIXIE FILE    Ordering User: CLEOTILDE ROLIN RAMAN   Pharmacist Choice Lancets MISC 200 each 5    Sig: Use to check blood sugar 1-2 times a day    Authorizing Provider: TRIXIE FILE    Ordering User: CLEOTILDE, Leiah Giannotti S   Blood Glucose Calibration (ONETOUCH VERIO) High LIQD 1 each 0    Sig: 1 each by In Vitro route once for 1 dose. Use to calibrate glucometer    Authorizing Provider: TRIXIE FILE    Ordering User: CLEOTILDE, Alletta Mattos S   Blood Glucose Monitoring Suppl (ONETOUCH VERIO REFLECT) w/Device KIT 1 kit 0    Sig: Use to check blood sugar 1-2 times a day    Authorizing Provider: TRIXIE FILE    Ordering User: CLEOTILDE ROLIN RAMAN

## 2023-04-20 NOTE — Telephone Encounter (Signed)
 MEDICATION:  1)OneTouch Verio ONETOUCH VERIO test strip  2) Pharmacist Choice Lancets Pharmacist Choice Lancets MISC  3) One Touch Verio Reflect (Strip Reader?)  PHARMACY:  [facility] in [place]  Phone :  [phone] Fax:  [phone]  HAS THE PATIENT CONTACTED THEIR PHARMACY?  Yes  IS THIS A 90 DAY SUPPLY : Yes  IS PATIENT OUT OF MEDICATION: Yes  IF NOT; HOW MUCH IS LEFT:   LAST APPOINTMENT DATE: @12 /03/2023  NEXT APPOINTMENT DATE:@4 /14/2025  DO WE HAVE YOUR PERMISSION TO LEAVE A DETAILED MESSAGE?: Yes  OTHER COMMENTS:    **Let patient know to contact pharmacy at the end of the day to make sure medication is ready. **  ** Please notify patient to allow 48-72 hours to process**  **Encourage patient to contact the pharmacy for refills or they can request refills through [facility]**

## 2023-05-02 ENCOUNTER — Other Ambulatory Visit: Payer: Self-pay | Admitting: Cardiovascular Disease

## 2023-05-07 NOTE — Progress Notes (Signed)
Remote pacemaker transmission.   

## 2023-05-21 DIAGNOSIS — H5203 Hypermetropia, bilateral: Secondary | ICD-10-CM | POA: Diagnosis not present

## 2023-05-21 DIAGNOSIS — H35363 Drusen (degenerative) of macula, bilateral: Secondary | ICD-10-CM | POA: Diagnosis not present

## 2023-05-26 ENCOUNTER — Other Ambulatory Visit: Payer: Self-pay | Admitting: Family Medicine

## 2023-07-01 ENCOUNTER — Ambulatory Visit: Payer: Medicare Other

## 2023-07-01 DIAGNOSIS — I442 Atrioventricular block, complete: Secondary | ICD-10-CM | POA: Diagnosis not present

## 2023-07-02 ENCOUNTER — Ambulatory Visit: Payer: Medicare Other | Admitting: Family Medicine

## 2023-07-06 ENCOUNTER — Ambulatory Visit (INDEPENDENT_AMBULATORY_CARE_PROVIDER_SITE_OTHER): Admitting: Family Medicine

## 2023-07-06 ENCOUNTER — Encounter: Payer: Self-pay | Admitting: Family Medicine

## 2023-07-06 ENCOUNTER — Ambulatory Visit: Admitting: Family Medicine

## 2023-07-06 ENCOUNTER — Encounter: Payer: Self-pay | Admitting: Internal Medicine

## 2023-07-06 VITALS — BP 136/74 | HR 63 | Temp 97.5°F | Ht 70.0 in | Wt 189.0 lb

## 2023-07-06 DIAGNOSIS — I10 Essential (primary) hypertension: Secondary | ICD-10-CM

## 2023-07-06 DIAGNOSIS — R739 Hyperglycemia, unspecified: Secondary | ICD-10-CM | POA: Diagnosis not present

## 2023-07-06 DIAGNOSIS — I2510 Atherosclerotic heart disease of native coronary artery without angina pectoris: Secondary | ICD-10-CM

## 2023-07-06 DIAGNOSIS — E785 Hyperlipidemia, unspecified: Secondary | ICD-10-CM | POA: Diagnosis not present

## 2023-07-06 LAB — COMPREHENSIVE METABOLIC PANEL
ALT: 15 U/L (ref 0–53)
AST: 15 U/L (ref 0–37)
Albumin: 4 g/dL (ref 3.5–5.2)
Alkaline Phosphatase: 67 U/L (ref 39–117)
BUN: 21 mg/dL (ref 6–23)
CO2: 30 meq/L (ref 19–32)
Calcium: 9.6 mg/dL (ref 8.4–10.5)
Chloride: 98 meq/L (ref 96–112)
Creatinine, Ser: 1.04 mg/dL (ref 0.40–1.50)
GFR: 72.16 mL/min (ref >60.00)
Glucose, Bld: 113 mg/dL — ABNORMAL HIGH (ref 70–99)
Potassium: 4 meq/L (ref 3.5–5.1)
Sodium: 136 meq/L (ref 135–145)
Total Bilirubin: 0.6 mg/dL (ref 0.2–1.2)
Total Protein: 7 g/dL (ref 6.0–8.3)

## 2023-07-06 LAB — CUP PACEART REMOTE DEVICE CHECK
Battery Remaining Longevity: 50 mo
Battery Remaining Percentage: 49 %
Battery Voltage: 2.99 V
Brady Statistic AP VP Percent: 5.2 %
Brady Statistic AP VS Percent: 1 %
Brady Statistic AS VP Percent: 94 %
Brady Statistic AS VS Percent: 1 %
Brady Statistic RA Percent Paced: 4.4 %
Brady Statistic RV Percent Paced: 99 %
Date Time Interrogation Session: 20250314165217
Implantable Lead Connection Status: 753985
Implantable Lead Connection Status: 753985
Implantable Lead Implant Date: 20200302
Implantable Lead Implant Date: 20200302
Implantable Lead Location: 753859
Implantable Lead Location: 753860
Implantable Lead Model: 3830
Implantable Pulse Generator Implant Date: 20200302
Lead Channel Impedance Value: 450 Ohm
Lead Channel Impedance Value: 490 Ohm
Lead Channel Pacing Threshold Amplitude: 0.5 V
Lead Channel Pacing Threshold Amplitude: 0.75 V
Lead Channel Pacing Threshold Pulse Width: 0.5 ms
Lead Channel Pacing Threshold Pulse Width: 0.5 ms
Lead Channel Sensing Intrinsic Amplitude: 1.4 mV
Lead Channel Sensing Intrinsic Amplitude: 12 mV
Lead Channel Setting Pacing Amplitude: 2 V
Lead Channel Setting Pacing Amplitude: 2.5 V
Lead Channel Setting Pacing Pulse Width: 0.5 ms
Lead Channel Setting Sensing Sensitivity: 4 mV
Pulse Gen Model: 2272
Pulse Gen Serial Number: 9114136

## 2023-07-06 LAB — TSH: TSH: 3.85 u[IU]/mL (ref 0.35–5.50)

## 2023-07-06 LAB — LIPID PANEL
Cholesterol: 166 mg/dL (ref 0–200)
HDL: 46 mg/dL (ref >39.00)
LDL Cholesterol: 50 mg/dL (ref 0–99)
NonHDL: 119.88
Total CHOL/HDL Ratio: 4
Triglycerides: 348 mg/dL — ABNORMAL HIGH (ref 0.0–149.0)
VLDL: 69.6 mg/dL — ABNORMAL HIGH (ref 0.0–40.0)

## 2023-07-06 LAB — CBC
HCT: 44.9 % (ref 39.0–52.0)
Hemoglobin: 15.2 g/dL (ref 13.0–17.0)
MCHC: 33.9 g/dL (ref 30.0–36.0)
MCV: 93.6 fl (ref 78.0–100.0)
Platelets: 217 10*3/uL (ref 150.0–400.0)
RBC: 4.8 Mil/uL (ref 4.22–5.81)
RDW: 13.4 % (ref 11.5–15.5)
WBC: 6.8 10*3/uL (ref 4.0–10.5)

## 2023-07-06 LAB — HEMOGLOBIN A1C: Hgb A1c MFr Bld: 7.5 % — ABNORMAL HIGH (ref 4.6–6.5)

## 2023-07-06 NOTE — Progress Notes (Signed)
   Daniel Carey is a 72 y.o. male who presents today for an office visit.  Assessment/Plan:  Chronic Problems Addressed Today: Hyperlipidemia Follows with cardiology.  Wishes to have labs done today.  He is on Crestor 20 mg daily, Zetia 10 mg daily, and Tricor 145 mg daily.  We will check labs today.  Essential hypertension Blood pressure at goal today on recheck.  Continue metoprolol tartrate 12.5 mg twice daily.  CAD (coronary artery disease) Follows with cardiology.  On statin and aspirin.     Subjective:  HPI:  See Assessment / plan for status of chronic conditions.  No acute concerns today.  He would like to have labs checked.       Objective:  Physical Exam: BP 136/74   Pulse 63   Temp (!) 97.5 F (36.4 C) (Temporal)   Ht 5\' 10"  (1.778 m)   Wt 189 lb (85.7 kg)   SpO2 98%   BMI 27.12 kg/m   Gen: No acute distress, resting comfortably CV: Regular rate and rhythm with no murmurs appreciated Pulm: Normal work of breathing, clear to auscultation bilaterally with no crackles, wheezes, or rhonchi Neuro: Grossly normal, moves all extremities Psych: Normal affect and thought content      Camie Hauss M. Jimmey Ralph, MD 07/06/2023 1:28 PM

## 2023-07-06 NOTE — Patient Instructions (Signed)
 It was very nice to see you today!  We will check blood work.  Please keep working on diet and exercise.  I will see you back in 6 months.  Please come back sooner if needed.  Return in about 6 months (around 01/06/2024) for Annual Physical.   Take care, Dr Jimmey Ralph  PLEASE NOTE:  If you had any lab tests, please let us know if you have not heard back within a few days. You may see your results on mychart before we have a chance to review them but we will give you a call once they are reviewed by [place].   If we ordered any referrals today, please let us know if you have not heard from their office within the next week.   If you had any urgent prescriptions sent in today, please check with the pharmacy within an hour of our visit to make sure the prescription was transmitted appropriately.   Please try these tips to maintain a healthy lifestyle:  Eat at least 3 REAL meals and 1-2 snacks per day.  Aim for no more than 5 hours between eating.  If you eat breakfast, please do so within one hour of getting up.   Each meal should contain half fruits/vegetables, one quarter protein, and one quarter carbs (no bigger than a computer mouse)  Cut down on sweet beverages. This includes juice, soda, and sweet tea.   Drink at least 1 glass of water with each meal and aim for at least 8 glasses per day  Exercise at least 150 minutes every week.

## 2023-07-06 NOTE — Assessment & Plan Note (Signed)
 Follows with cardiology.  On statin and aspirin.

## 2023-07-06 NOTE — Assessment & Plan Note (Signed)
 Follows with cardiology.  Wishes to have labs done today.  He is on Crestor 20 mg daily, Zetia 10 mg daily, and Tricor 145 mg daily.  We will check labs today.

## 2023-07-06 NOTE — Assessment & Plan Note (Signed)
 Blood pressure at goal today on recheck.  Continue metoprolol tartrate 12.5 mg twice daily.

## 2023-07-08 ENCOUNTER — Encounter: Payer: Self-pay | Admitting: Family Medicine

## 2023-07-08 NOTE — Progress Notes (Signed)
 His A1c is 7.5 and stable compared to previous.  He can discuss more with endocrinology.  His LDL and HDL are at goal.  His triglycerides are elevated.  This may be due to the labs being nonfasting.  We can recheck this at his next office visit.  The rest of his labs are all stable.  We can recheck in 6 months.

## 2023-07-29 ENCOUNTER — Other Ambulatory Visit: Payer: Self-pay | Admitting: Internal Medicine

## 2023-08-01 ENCOUNTER — Other Ambulatory Visit: Payer: Self-pay | Admitting: Family Medicine

## 2023-08-02 ENCOUNTER — Ambulatory Visit: Payer: Medicare Other | Admitting: Internal Medicine

## 2023-08-12 ENCOUNTER — Other Ambulatory Visit: Payer: Self-pay | Admitting: Family Medicine

## 2023-08-12 ENCOUNTER — Telehealth: Payer: Self-pay | Admitting: Cardiology

## 2023-08-12 NOTE — Telephone Encounter (Signed)
 What problem are you experiencing? Pt is having to change company's for his supplies.  Who is your medical equipment company? [employer] They needs new script and a copy of his sleep study  3)    If patient is calling about their sleep study results please route to [facility] Sleep Study Pool.   Please route to the sleep study coordinator.

## 2023-08-13 ENCOUNTER — Other Ambulatory Visit (HOSPITAL_COMMUNITY): Payer: Self-pay

## 2023-08-13 NOTE — Addendum Note (Signed)
 Addended by: Lott Rouleau A on: 08/13/2023 12:46 PM   Modules accepted: Orders

## 2023-08-13 NOTE — Progress Notes (Signed)
 Remote pacemaker transmission.

## 2023-09-15 ENCOUNTER — Ambulatory Visit: Admitting: Cardiology

## 2023-09-16 DIAGNOSIS — M5416 Radiculopathy, lumbar region: Secondary | ICD-10-CM | POA: Diagnosis not present

## 2023-09-22 ENCOUNTER — Encounter: Payer: Self-pay | Admitting: Cardiovascular Disease

## 2023-09-23 ENCOUNTER — Telehealth: Payer: Self-pay | Admitting: Cardiovascular Disease

## 2023-09-23 DIAGNOSIS — Z79899 Other long term (current) drug therapy: Secondary | ICD-10-CM

## 2023-09-23 DIAGNOSIS — E782 Mixed hyperlipidemia: Secondary | ICD-10-CM

## 2023-09-23 NOTE — Telephone Encounter (Signed)
 Pt states he wants to get the blood work, "that Dr. Alroy Aspen orders for me before he sees me". States he is not able to get in to see his new doctor, Elodia Hailstone, until September or so.  He would like to have labs done before [employer]. (He understands he just had cholesterol checked by PCP in March, but he wants it followed by cardiology, and would like to have drawn again)  Forwarding to Dr. Alroy Aspen for advisement.

## 2023-09-23 NOTE — Telephone Encounter (Signed)
 Pts requesting a order for blood work. Please advise

## 2023-09-27 NOTE — Telephone Encounter (Signed)
 Called and spoke with patient. Labs entered and released. Pt due for annual visit and will follow with Alda Amas upon [employer] retirement. Scheduled for August 2025. No further questions.

## 2023-09-30 ENCOUNTER — Ambulatory Visit (INDEPENDENT_AMBULATORY_CARE_PROVIDER_SITE_OTHER): Payer: Medicare Other

## 2023-09-30 DIAGNOSIS — I441 Atrioventricular block, second degree: Secondary | ICD-10-CM

## 2023-10-01 LAB — CUP PACEART REMOTE DEVICE CHECK
Battery Remaining Longevity: 48 mo
Battery Remaining Percentage: 46 %
Battery Voltage: 2.98 V
Brady Statistic AP VP Percent: 6.7 %
Brady Statistic AP VS Percent: 1 %
Brady Statistic AS VP Percent: 92 %
Brady Statistic AS VS Percent: 1 %
Brady Statistic RA Percent Paced: 5.9 %
Brady Statistic RV Percent Paced: 99 %
Date Time Interrogation Session: 20250612020014
Implantable Lead Connection Status: 753985
Implantable Lead Connection Status: 753985
Implantable Lead Implant Date: 20200302
Implantable Lead Implant Date: 20200302
Implantable Lead Location: 753859
Implantable Lead Location: 753860
Implantable Lead Model: 3830
Implantable Pulse Generator Implant Date: 20200302
Lead Channel Impedance Value: 460 Ohm
Lead Channel Impedance Value: 490 Ohm
Lead Channel Pacing Threshold Amplitude: 0.5 V
Lead Channel Pacing Threshold Amplitude: 0.75 V
Lead Channel Pacing Threshold Pulse Width: 0.5 ms
Lead Channel Pacing Threshold Pulse Width: 0.5 ms
Lead Channel Sensing Intrinsic Amplitude: 1.8 mV
Lead Channel Sensing Intrinsic Amplitude: 12 mV
Lead Channel Setting Pacing Amplitude: 2 V
Lead Channel Setting Pacing Amplitude: 2.5 V
Lead Channel Setting Pacing Pulse Width: 0.5 ms
Lead Channel Setting Sensing Sensitivity: 4 mV
Pulse Gen Model: 2272
Pulse Gen Serial Number: 9114136

## 2023-10-03 ENCOUNTER — Ambulatory Visit: Payer: Self-pay | Admitting: Internal Medicine

## 2023-10-12 ENCOUNTER — Other Ambulatory Visit: Payer: Self-pay

## 2023-10-12 DIAGNOSIS — I6523 Occlusion and stenosis of bilateral carotid arteries: Secondary | ICD-10-CM

## 2023-10-18 DIAGNOSIS — M5451 Vertebrogenic low back pain: Secondary | ICD-10-CM | POA: Diagnosis not present

## 2023-10-25 ENCOUNTER — Other Ambulatory Visit: Payer: Self-pay | Admitting: *Deleted

## 2023-10-25 ENCOUNTER — Telehealth: Payer: Self-pay | Admitting: *Deleted

## 2023-10-25 ENCOUNTER — Telehealth: Payer: Self-pay | Admitting: Internal Medicine

## 2023-10-25 ENCOUNTER — Ambulatory Visit: Attending: Cardiology | Admitting: Cardiology

## 2023-10-25 VITALS — BP 128/66 | HR 68 | Ht 70.5 in | Wt 182.4 lb

## 2023-10-25 DIAGNOSIS — G4733 Obstructive sleep apnea (adult) (pediatric): Secondary | ICD-10-CM | POA: Diagnosis not present

## 2023-10-25 DIAGNOSIS — E785 Hyperlipidemia, unspecified: Secondary | ICD-10-CM

## 2023-10-25 DIAGNOSIS — I10 Essential (primary) hypertension: Secondary | ICD-10-CM

## 2023-10-25 DIAGNOSIS — I2510 Atherosclerotic heart disease of native coronary artery without angina pectoris: Secondary | ICD-10-CM

## 2023-10-25 DIAGNOSIS — I442 Atrioventricular block, complete: Secondary | ICD-10-CM

## 2023-10-25 NOTE — Telephone Encounter (Signed)
 Patient's wife, Daniel Carey,  came in to office today and said that insurance is not covering his ONETOUCH VERIO test strip, Blood Glucose Monitoring Suppl (ONETOUCH VERIO REFLECT) w/Device KIT and wants to know what the Daniel Carey needs to do.  Daniel Carey states that Daniel Carey has supplies right now, but for future wants to know what he can do.  Daniel Carey states that insurance will cover [employer] supplies and Accu-[employer] supplies.

## 2023-10-25 NOTE — Telephone Encounter (Signed)
 Called patient and order placed for lab work. Made patient aware nothing to eat after midnight the night before test. Verbalized understanding.

## 2023-10-25 NOTE — Progress Notes (Signed)
 Date:  10/25/2023   ID:  Daniel Carey, DOB 01-13-1951, MRN [id number] The patient was identified using 2 identifiers.  PCP:  Daniel Worth HERO, MD   [facility] Providers Cardiologist:  Daniel LITTIE Nanas, MD Electrophysiologist:  Daniel Birmingham, MD  Sleep Medicine:  Daniel Bihari, MD     Evaluation Performed:  Follow-Up Visit  Chief Complaint:  OSA  History of Present Illness:    Daniel Carey is a 73 y.o. male with a history of snoring and excessive daytime fatigue but with an Epworth sleepiness scale of only 5.   He underwent PSG showing severe OSA with an AHI of 51 events per hour, most occurring in the supine position in NREM sleep.  He oxygen saturations dropped to 89% with respiratory events.  He underwent CPAP titration to 10cm H2O.    He is doing well with his PAP device and thinks that he has gotten used to it.  He tolerates the full face mask and feels the pressure is adequate.  Since going on PAP he feels rested in the am and has no significant daytime sleepiness.  He denies any significant  nasal dryness or nasal congestion. He has a lot of mouth dryness.  He does not think that he snores.     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    noted on EKG March 2013   CAD (coronary artery disease)    s/p atherectomy, PTCA/DES x2 mid & distal RCA 06/30/12   Carotid artery occlusion    Chest pain    Complication of anesthesia    heart rate dropping with propofol  last procedure   COPD (chronic obstructive pulmonary disease) (HCC)    Dyspnea    ED (erectile dysfunction)    Esophageal stricture    External hemorrhoids without mention of complication    GERD (gastroesophageal reflux disease)    Hiatal hernia    History of kidney stones    HLD (hyperlipidemia)    HTN (hypertension)    Myocardial infarction (HCC)    1996   Personal history of colonic polyps 03/12/2010   TUBULAR ADENOMA   Pneumonia    Presence of  permanent cardiac pacemaker    Sleep apnea    wears CPAP   Tobacco abuse    Tubular adenoma of colon    Type 2 diabetes mellitus (HCC)    Past Surgical History:  Procedure Laterality Date   ANGIOPLASTY     X3   ANTERIOR CERVICAL DECOMP/DISCECTOMY FUSION N/A 11/25/2022   Procedure: Anterior Cervical Decompression/Discectomy Fusion Cervical Three-Four;  Surgeon: Debby Dorn MATSU, MD;  Location: [facility] OR;  Service: Neurosurgery;  Laterality: N/A;   CARDIAC CATHETERIZATION     8 years ago stents   COLONOSCOPY N/A 02/16/2017   Procedure: COLONOSCOPY;  Surgeon: Albertus Gordy HERO, MD;  Location: [facility]OSCOPY;  Service: Gastroenterology;  Laterality: N/A;   COLONOSCOPY     CORONARY ARTERY BYPASS GRAFT N/A 07/19/2020   Procedure: CORONARY ARTERY BYPASS GRAFTING (CABG) TIMES THREE USING LEFT INTERNAL MAMMARY ARTERY AND RIGHT GREATER SAPHENOUS VEIN HARVESTED ENDOSCOPICALLY;  Surgeon: Lucas Dorise POUR, MD;  Location: [facility] OR;  Service: Open Heart Surgery;  Laterality: N/A;   INSERT / REPLACE / REMOVE PACEMAKER  06/20/2018   LEFT HEART CATH AND CORONARY ANGIOGRAPHY N/A 11/05/2016   Procedure: Left Heart Cath and Coronary Angiography;  Surgeon: Mady Lonni, MD;  Location:  [facility] INVAS[facility];  Service: Cardiovascular;  Laterality: N/A;   LEFT HEART CATH AND CORONARY ANGIOGRAPHY N/A 07/12/2020   Procedure: LEFT HEART CATH AND CORONARY ANGIOGRAPHY;  Surgeon: Verlin Daniel BIRCH, MD;  [facility]: [facility] LAB;  Service: Cardiovascular;  Laterality: N/A;   LUMBAR LAMINECTOMY/DECOMPRESSION MICRODISCECTOMY N/A 05/12/2018   Procedure: Microlumbar decompression L2-3, possible L1-2;  Surgeon: Duwayne Purchase, MD;  Location: [facility] OR;  Service: Orthopedics;  Laterality: N/A;  2 hrs, Microlumbar decompression L2-3, possible L1-2   PACEMAKER IMPLANT N/A 06/20/2018   Procedure: PACEMAKER IMPLANT;  Surgeon: Waddell Daniel ORN, MD;  [facility]: [facility];  Service: Cardiovascular;  Laterality: N/A;   PERCUTANEOUS  CORONARY ROTOBLATOR INTERVENTION (PCI-R) N/A 06/30/2012   Procedure: PERCUTANEOUS CORONARY ROTOBLATOR INTERVENTION (PCI-R);  Surgeon: Daniel BIRCH Verlin, MD;  Location: [facility] CATH LAB;  Service: Cardiovascular;  Laterality: N/A;   TEE WITHOUT CARDIOVERSION N/A 07/19/2020   Procedure: TRANSESOPHAGEAL ECHOCARDIOGRAM (TEE);  Surgeon: Lucas Dorise POUR, MD;  Location: [facility] OR;  Service: Open Heart Surgery;  Laterality: N/A;   VIDEO BRONCHOSCOPY  08/13/2011   Procedure: VIDEO BRONCHOSCOPY WITHOUT FLUORO;  Surgeon: Ozell KATHEE America, MD;  Location: [facility];  Service: Cardiopulmonary;  Laterality: Bilateral;     Current Meds  Medication Sig   acetaminophen  (TYLENOL ) 500 MG tablet Take 1,000 mg by mouth every 6 (six) hours as needed (pain).    albuterol  (VENTOLIN  HFA) 108 (90 Base) MCG/ACT inhaler INHALE 2 PUFFS INTO THE LUNGS EVERY 6 HOURS AS NEEDED FOR WHEEZE OR SHORTNESS OF BREATH   aspirin  EC 81 MG tablet Take 1 tablet (81 mg total) by mouth daily. Swallow whole.   Blood Glucose Monitoring Suppl (ONETOUCH VERIO REFLECT) w/Device KIT Use to check blood sugar 1-2 times a day   ezetimibe  (ZETIA ) 10 MG tablet TAKE 1 TABLET BY MOUTH EVERY DAY   famotidine  (PEPCID ) 20 MG tablet Take 20 mg by mouth daily as needed for heartburn.   fenofibrate  (TRICOR ) 145 MG tablet TAKE 1 TABLET BY MOUTH EVERY DAY   hydrochlorothiazide  (HYDRODIURIL ) 25 MG tablet TAKE 1/2 TABLET (12.5 MG TOTAL) BY MOUTH DAILY AS NEEDED.   ipratropium (ATROVENT ) 0.06 % nasal spray PLACE 2 SPRAYS INTO BOTH NOSTRILS 4 (FOUR) TIMES DAILY AS DIRECTED   metoprolol  tartrate (LOPRESSOR ) 25 MG tablet TAKE 1 TABLET BY MOUTH TWICE A DAY   nitroGLYCERIN  (NITROSTAT ) 0.4 MG SL tablet Place 1 tablet (0.4 mg total) under the tongue every 5 (five) minutes as needed for chest pain.   ONETOUCH VERIO test strip Use to check blood sugar 1-2 times a day   Pharmacist Choice Lancets MISC Use to check blood sugar 1-2 times a day   rosuvastatin  (CRESTOR ) 20 MG  tablet TAKE 1 TABLET BY MOUTH EVERY DAY   sildenafil  (VIAGRA ) 100 MG tablet Take 1 tablet by mouth prior to sexual activity as needed     Allergies:   Amlodipine besylate, Propofol , Lisinopril , and Rosuvastatin    Social History   Tobacco Use   Smoking status: Former    Current packs/day: 0.00    Average packs/day: 0.5 packs/day for 40.0 years (20.0 ttl pk-yrs)    Types: Cigarettes    Start date: 08/25/1971    Quit date: 08/25/2011    Years since quitting: 12.1   Smokeless tobacco: Never  Vaping Use   Vaping status: Never Used  Substance Use Topics   Alcohol use: Not Currently   Drug use: No     Family Hx: The patient's family history includes  Breast cancer in his sister; Diabetes in his mother; Heart disease in his father; Kidney failure in his mother. There is no history of Colon cancer, Stomach cancer, Esophageal cancer, or Rectal cancer.  ROS:   Please see the history of present illness.     All other systems reviewed and are negative.   Prior Sleep studies:   The following studies were reviewed today:  PAP compliance download  Labs/Other Tests and Data Reviewed:     Recent Labs: 07/06/2023: ALT 15; BUN 21; Creatinine, Ser 1.04; Hemoglobin 15.2; Platelets 217.0; Potassium 4.0; Sodium 136; TSH 3.85   Wt Readings from Last 3 Encounters:  10/25/23 182 lb 6.4 oz (82.7 kg)  07/06/23 189 lb (85.7 kg)  04/01/23 185 lb 9.6 oz (84.2 kg)     Risk Assessment/Calculations:      Objective:    Vital Signs:  BP 128/66 (BP Location: Right Arm, Patient Position: Sitting)   Pulse 68   Ht 5' 10.5 (1.791 m)   Wt 182 lb 6.4 oz (82.7 kg)   SpO2 98%   BMI 25.80 kg/m   GEN: Well nourished, well developed in no acute distress HEENT: Normal NECK: No JVD; No carotid bruits LYMPHATICS: No lymphadenopathy CARDIAC:RRR, no murmurs, rubs, gallops RESPIRATORY:  Clear to auscultation without rales, wheezing or rhonchi  ABDOMEN: Soft, non-tender, non-distended MUSCULOSKELETAL:  No  edema; No deformity  SKIN: Warm and dry NEUROLOGIC:  Alert and oriented x 3 PSYCHIATRIC:  Normal affect  ASSESSMENT & PLAN:    OSA - The patient is tolerating PAP therapy well without any problems. The patient has been using and benefiting from PAP use and will continue to benefit from therapy. I will get a download from his DME.  HTN -BP controlled on exam today - Continue HCTZ 12.5 mg daily, Lopressor  25 mg twice daily with as needed refills - I have personally reviewed and interpreted outside labs performed by patient's PCP which showed SCr 1.04 and K+ 4 on 07/06/2023  Medication Adjustments/Labs and Tests Ordered: Current medicines are reviewed at length with the patient today.  Concerns regarding medicines are outlined above.   Tests Ordered: No orders of the defined types were placed in this encounter.   Medication Changes: No orders of the defined types were placed in this encounter.   Follow Up:  Virtual Visit  in 6 after he gets new device  Signed, Daniel Bihari, MD  10/25/2023 3:15 PM    [facility]

## 2023-10-25 NOTE — Patient Instructions (Signed)
 Medication Instructions:  Your physician recommends that you continue on your current medications as directed. Please refer to the Current Medication list given to you today.  *If you need a refill on your cardiac medications before your next appointment, please call your pharmacy*  Lab Work: None.  If you have labs (blood work) drawn today and your tests are completely normal, you will receive your results only by: MyChart Message (if you have MyChart) OR A paper copy in the mail If you have any lab test that is abnormal or we need to change your treatment, we will call you to review the results.  Testing/Procedures: None.  Follow-Up: At [facility], you and your health needs are our priority.  As part of our continuing mission to provide you with exceptional heart care, our providers are all part of one team.  This team includes your primary Cardiologist (physician) and Advanced Practice Providers or APPs (Physician Assistants and Nurse Practitioners) who all work together to provide you with the care you need, when you need it.  Your next appointment:   1 year(s)  Provider:   Dr. Wilbert Bihari, MD  We recommend signing up for the patient portal called MyChart.  Sign up information is provided on this After Visit Summary.  MyChart is used to connect with patients for Virtual Visits (Telemedicine).  Patients are able to view lab/test results, encounter notes, upcoming appointments, etc.  Non-urgent messages can be sent to your provider as well.   To learn more about what you can do with MyChart, go to [email or web address].   Other Instructions Dr. Bihari has ordered new cpap supplies for you. If you do not hear from [facility] within 2 weeks, please let us  know.

## 2023-10-27 ENCOUNTER — Ambulatory Visit (HOSPITAL_COMMUNITY)
Admission: RE | Admit: 2023-10-27 | Discharge: 2023-10-27 | Disposition: A | Discharge status: Home / Self Care | Source: Ambulatory Visit | Attending: Vascular Surgery | Admitting: Vascular Surgery

## 2023-10-27 ENCOUNTER — Ambulatory Visit: Attending: Vascular Surgery | Admitting: Physician Assistant

## 2023-10-27 VITALS — BP 153/88 | HR 60 | Temp 97.9°F | Ht 70.5 in | Wt 182.9 lb

## 2023-10-27 DIAGNOSIS — I6523 Occlusion and stenosis of bilateral carotid arteries: Secondary | ICD-10-CM

## 2023-10-28 ENCOUNTER — Encounter: Payer: Self-pay | Admitting: Physician Assistant

## 2023-10-28 ENCOUNTER — Other Ambulatory Visit: Payer: Self-pay | Admitting: *Deleted

## 2023-10-28 ENCOUNTER — Other Ambulatory Visit: Payer: Self-pay | Admitting: Family Medicine

## 2023-10-28 DIAGNOSIS — I6523 Occlusion and stenosis of bilateral carotid arteries: Secondary | ICD-10-CM

## 2023-10-28 NOTE — Progress Notes (Signed)
 Office Note     CC:  follow up Requesting Provider:  Kennyth Worth HERO, MD  HPI: Daniel Carey is a 73 y.o. (May 06, 1950) male who presents for serve Allen's of carotid artery stenosis.  He denies any CVA or TIA since he was last seen in the office a year ago.  He has also not had any strokelike symptoms including slurring speech, changes in vision, or one-sided weakness.  He is on aspirin  and statin daily.  He follows regularly with his cardiologist due to history of CABG and pacemaker.  He is on aspirin  and statin daily.  He denies tobacco use.   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    noted on EKG March 2013   CAD (coronary artery disease)    s/p atherectomy, PTCA/DES x2 mid & distal RCA 06/30/12   Carotid artery occlusion    Chest pain    Complication of anesthesia    heart rate dropping with propofol  last procedure   COPD (chronic obstructive pulmonary disease) (HCC)    Dyspnea    ED (erectile dysfunction)    Esophageal stricture    External hemorrhoids without mention of complication    GERD (gastroesophageal reflux disease)    Hiatal hernia    History of kidney stones    HLD (hyperlipidemia)    HTN (hypertension)    Myocardial infarction (HCC)    1996   Personal history of colonic polyps 03/12/2010   TUBULAR ADENOMA   Pneumonia    Presence of permanent cardiac pacemaker    Sleep apnea    wears CPAP   Tobacco abuse    Tubular adenoma of colon    Type 2 diabetes mellitus (HCC)     Past Surgical History:  Procedure Laterality Date   ANGIOPLASTY     X3   ANTERIOR CERVICAL DECOMP/DISCECTOMY FUSION N/A 11/25/2022   Procedure: Anterior Cervical Decompression/Discectomy Fusion Cervical Three-Four;  Surgeon: Debby Dorn MATSU, MD;  Location: [facility] OR;  Service: Neurosurgery;  Laterality: N/A;   CARDIAC CATHETERIZATION     8 years ago stents   COLONOSCOPY N/A 02/16/2017   Procedure: COLONOSCOPY;  Surgeon: Albertus Gordy HERO, MD;   Location: [facility];  Service: Gastroenterology;  Laterality: N/A;   COLONOSCOPY     CORONARY ARTERY BYPASS GRAFT N/A 07/19/2020   Procedure: CORONARY ARTERY BYPASS GRAFTING (CABG) TIMES THREE USING LEFT INTERNAL MAMMARY ARTERY AND RIGHT GREATER SAPHENOUS VEIN HARVESTED ENDOSCOPICALLY;  Surgeon: Lucas Dorise POUR, MD;  Location: [facility] OR;  Service: Open Heart Surgery;  Laterality: N/A;   INSERT / REPLACE / REMOVE PACEMAKER  06/20/2018   LEFT HEART CATH AND CORONARY ANGIOGRAPHY N/A 11/05/2016   Procedure: Left Heart Cath and Coronary Angiography;  Surgeon: Mady Bruckner, MD;  [facility];  Service: Cardiovascular;  Laterality: N/A;   LEFT HEART CATH AND CORONARY ANGIOGRAPHY N/A 07/12/2020   Procedure: LEFT HEART CATH AND CORONARY ANGIOGRAPHY;  Surgeon: Verlin Bruckner BIRCH, MD;  [facility]: [facility];  Service: Cardiovascular;  Laterality: N/A;   LUMBAR LAMINECTOMY/DECOMPRESSION MICRODISCECTOMY N/A 05/12/2018   Procedure: Microlumbar decompression L2-3, possible L1-2;  Surgeon: Duwayne Purchase, MD;  Location: [facility] OR;  Service: Orthopedics;  Laterality: N/A;  2 hrs, Microlumbar decompression L2-3, possible L1-2   PACEMAKER IMPLANT N/A 06/20/2018   Procedure: PACEMAKER IMPLANT;  Surgeon: Waddell Danelle ORN, MD;  [facility]: [facility];  Service: Cardiovascular;  Laterality: N/A;  PERCUTANEOUS CORONARY ROTOBLATOR INTERVENTION (PCI-R) N/A 06/30/2012   Procedure: PERCUTANEOUS CORONARY ROTOBLATOR INTERVENTION (PCI-R);  Surgeon: Lonni JONETTA Cash, MD;  Location: [facility] CATH LAB;  Service: Cardiovascular;  Laterality: N/A;   TEE WITHOUT CARDIOVERSION N/A 07/19/2020   Procedure: TRANSESOPHAGEAL ECHOCARDIOGRAM (TEE);  Surgeon: Lucas Dorise POUR, MD;  Location: [facility] OR;  Service: Open Heart Surgery;  Laterality: N/A;   VIDEO BRONCHOSCOPY  08/13/2011   Procedure: VIDEO BRONCHOSCOPY WITHOUT FLUORO;  Surgeon: Ozell KATHEE America, MD;  Location: [facility];  Service: Cardiopulmonary;   Laterality: Bilateral;    Social History   Socioeconomic History   Marital status: Married    Spouse name: Not on file   Number of children: 1   Years of education: Not on file   Highest education level: Not on file  Occupational History   Occupation: Regulatory affairs officer  Tobacco Use   Smoking status: Former    Current packs/day: 0.00    Average packs/day: 0.5 packs/day for 40.0 years (20.0 ttl pk-yrs)    Types: Cigarettes    Start date: 08/25/1971    Quit date: 08/25/2011    Years since quitting: 12.1   Smokeless tobacco: Never  Vaping Use   Vaping status: Never Used  Substance and Sexual Activity   Alcohol use: Not Currently   Drug use: No   Sexual activity: Not Currently  Other Topics Concern   Not on file  Social History Narrative   Not on file   Social Drivers of Health   Financial Resource Strain: Not on file  Food Insecurity: Low Risk  (07/16/2022)   Received from [facility]   Hunger Vital Sign    Within the past 12 months, you worried that your food would run out before you got money to buy more: Never true    Within the past 12 months, the food you bought just didn't last and you didn't have money to get more. : Never true  Transportation Needs: Not on file (07/16/2022)  Physical Activity: Not on file  Stress: Not on file  Social Connections: Not on file  Intimate Partner Violence: Not on file    Family History  Problem Relation Age of Onset   Diabetes Mother        and brother    Kidney failure Mother    Heart disease Father        hx of MI   Breast cancer Sister    Colon cancer Neg Hx    Stomach cancer Neg Hx    Esophageal cancer Neg Hx    Rectal cancer Neg Hx     Current Outpatient Medications  Medication Sig Dispense Refill   acetaminophen  (TYLENOL ) 500 MG tablet Take 1,000 mg by mouth every 6 (six) hours as needed (pain).      albuterol  (VENTOLIN  HFA) 108 (90 Base) MCG/ACT inhaler INHALE 2 PUFFS INTO THE LUNGS EVERY 6 HOURS AS NEEDED FOR  WHEEZE OR SHORTNESS OF BREATH 8.5 each 3   aspirin  EC 81 MG tablet Take 1 tablet (81 mg total) by mouth daily. Swallow whole.     Blood Glucose Monitoring Suppl (ONETOUCH VERIO REFLECT) w/Device KIT Use to check blood sugar 1-2 times a day 1 kit 0   ezetimibe  (ZETIA ) 10 MG tablet TAKE 1 TABLET BY MOUTH EVERY DAY 90 tablet 3   famotidine  (PEPCID ) 20 MG tablet Take 20 mg by mouth daily as needed for heartburn.     fenofibrate  (TRICOR ) 145 MG tablet TAKE 1 TABLET BY MOUTH  EVERY DAY 90 tablet 3   hydrochlorothiazide  (HYDRODIURIL ) 25 MG tablet TAKE 1/2 TABLET (12.5 MG TOTAL) BY MOUTH DAILY AS NEEDED. 45 tablet 3   ipratropium (ATROVENT ) 0.06 % nasal spray PLACE 2 SPRAYS INTO BOTH NOSTRILS 4 (FOUR) TIMES DAILY AS DIRECTED 90 mL 1   metoprolol  tartrate (LOPRESSOR ) 25 MG tablet TAKE 1 TABLET BY MOUTH TWICE A DAY 180 tablet 1   nitroGLYCERIN  (NITROSTAT ) 0.4 MG SL tablet Place 1 tablet (0.4 mg total) under the tongue every 5 (five) minutes as needed for chest pain. 25 tablet 6   omeprazole  (PRILOSEC) 40 MG capsule Take 1 capsule (40 mg total) by mouth daily. (Patient taking differently: Take 40 mg by mouth daily as needed (acid reflux).) 90 capsule 3   ONETOUCH VERIO test strip Use to check blood sugar 1-2 times a day 200 each 5   Pharmacist Choice Lancets MISC Use to check blood sugar 1-2 times a day 200 each 5   rosuvastatin  (CRESTOR ) 20 MG tablet TAKE 1 TABLET BY MOUTH EVERY DAY 90 tablet 3   sildenafil  (VIAGRA ) 100 MG tablet Take 1 tablet by mouth prior to sexual activity as needed 15 tablet 11   No current facility-administered medications for this visit.    Allergies  Allergen Reactions   Amlodipine Besylate Other (See Comments)    Headache, weakness, dizziness   Propofol  Other (See Comments)    Heart rate dropped     Lisinopril  Cough and Rash   Rosuvastatin  Other (See Comments)     Corncern for muscle aches but he is tolerating now      REVIEW OF SYSTEMS:   [X]  denotes positive  finding, [ ]  denotes negative finding Cardiac  Comments:  Chest pain or chest pressure:    Shortness of breath upon exertion:    Short of breath when lying flat:    Irregular heart rhythm:        Vascular    Pain in calf, thigh, or hip brought on by ambulation:    Pain in feet at night that wakes you up from your sleep:     Blood clot in your veins:    Leg swelling:         Pulmonary    Oxygen at home:    Productive cough:     Wheezing:         Neurologic    Sudden weakness in arms or legs:     Sudden numbness in arms or legs:     Sudden onset of difficulty speaking or slurred speech:    Temporary loss of vision in one eye:     Problems with dizziness:         Gastrointestinal    Blood in stool:     Vomited blood:         Genitourinary    Burning when urinating:     Blood in urine:        Psychiatric    Major depression:         Hematologic    Bleeding problems:    Problems with blood clotting too easily:        Skin    Rashes or ulcers:        Constitutional    Fever or chills:      PHYSICAL EXAMINATION:  Vitals:   10/27/23 1431 10/27/23 1435  BP: (!) 161/88 (!) 153/88  Pulse: 60   Temp: 97.9 F (36.6 C)   TempSrc: Temporal   SpO2:  95%   Weight: 182 lb 14.4 oz (83 kg)   Height: 5' 10.5 (1.791 m)     General:  WDWN in NAD; vital signs documented above Gait: Not observed HENT: WNL, normocephalic Pulmonary: normal non-labored breathing Cardiac: regular HR Abdomen: soft, NT, no masses Skin: without rashes Vascular Exam/Pulses: symmetrical radial pulses Extremities: without ischemic changes, without Gangrene , without cellulitis; without open wounds;  Musculoskeletal: no muscle wasting or atrophy  Neurologic: A&O X 3; CN grossly intact Psychiatric:  The pt has Normal affect.   Non-Invasive Vascular Imaging:   B ICA 60-79% stenosis    ASSESSMENT/PLAN:: 73 y.o. male here for follow up for carotid artery stenosis  Subjectively, he has not  had any neurological events since last office visit.  Carotid stenosis has worsened bilaterally since last office visit a year ago.  Based on duplex he now has 60 to 79% stenosis of bilateral internal carotid arteries.  No indication for revascularization of asymptomatic stenosis.  We will shorten the interval of surveillance to 6 months.  He will continue his aspirin  and statin daily.  We discussed seeking immediate medical attention if he develops any strokelike symptoms.   Daniel Sender, PA-C [facility] [phone]  Clinic MD:   Sheree

## 2023-10-29 ENCOUNTER — Ambulatory Visit: Admitting: Internal Medicine

## 2023-10-29 ENCOUNTER — Encounter: Payer: Self-pay | Admitting: Internal Medicine

## 2023-10-29 VITALS — BP 120/60 | HR 68 | Ht 70.5 in | Wt 180.6 lb

## 2023-10-29 DIAGNOSIS — E7849 Other hyperlipidemia: Secondary | ICD-10-CM

## 2023-10-29 DIAGNOSIS — E1165 Type 2 diabetes mellitus with hyperglycemia: Secondary | ICD-10-CM

## 2023-10-29 DIAGNOSIS — E039 Hypothyroidism, unspecified: Secondary | ICD-10-CM

## 2023-10-29 LAB — POCT GLYCOSYLATED HEMOGLOBIN (HGB A1C): Hemoglobin A1C: 6.9 % — AB (ref 4.0–5.6)

## 2023-10-29 MED ORDER — ACCU-CHEK GUIDE W/DEVICE KIT: PACK | 0 refills | Status: AC

## 2023-10-29 MED ORDER — GLUCOSE BLOOD VI STRP: ORAL_STRIP | 3 refills | Status: AC

## 2023-10-29 MED ORDER — ACCU-CHEK SOFTCLIX LANCETS MISC: 3 refills | Status: AC

## 2023-10-29 NOTE — Patient Instructions (Addendum)
 Please check blood sugars 1x a day, rotating check times.  Please return in 6 months with your sugar log.

## 2023-10-29 NOTE — Progress Notes (Signed)
 Patient ID: Daniel Carey, male   DOB: 03/29/1951, 73 y.o.   MRN: [id number]  HPI  Daniel Carey is a 73 y.o.-year-old male, returning for f/u for hypothyroidism, dx 2012 and DM2, diet-controlled, non-insulin -dependent, with complications (CAD - s/p CABG 07/2020, carotid atherosclerosis).  Last visit 7 months ago.  Interim history: No increased urination, blurry vision, nausea. At the beginning of the year, he had cervical vertebral fracture >> had cervical decompression surgery 11/25/2022.  He recovered well after surgery, but he was still not exercising at last visit. Now more active, playing golf, and also cut down on nighttime snacking.  She lost 5 pounds since last visit. He had a carotid ultrasound recently and his carotid plaques have increased slightly.  He will have another ultrasound in 6 months.  Pt. was dx with hypothyroidism in 2012 >> he was on levothyroxine  up to 200 mcg in 2016 >> but did not feel well so he stopped. Afterwards, his TFTs fluctuated but in the last 3 years they were mostly in the normal range.  Reviewed previous TFTs: Lab Results  Component Value Date   TSH 3.85 07/06/2023   TSH 3.79 04/01/2023   TSH 5.14 05/29/2022   TSH 3.73 06/25/2021   TSH 5.070 (H) 03/07/2021   TSH 3.96 03/20/2020   TSH 3.83 08/22/2019   TSH 3.99 01/31/2018   TSH 4.50 08/02/2017   TSH 4.89 (H) 12/08/2016   FREET4 1.0 04/01/2023   FREET4 0.85 05/29/2022   FREET4 1.04 03/07/2021   FREET4 0.75 03/20/2020   FREET4 0.80 01/31/2018   FREET4 0.65 08/02/2017   FREET4 0.83 12/08/2016   FREET4 0.67 03/06/2016   FREET4 0.66 09/05/2015   FREET4 1.25 05/07/2015    Lab Results  Component Value Date   T3FREE 3.5 04/01/2023   T3FREE 2.6 05/29/2022   T3FREE 2.7 03/07/2021   T3FREE 3.0 03/20/2020   T3FREE 2.8 01/31/2018   T3FREE 2.6 08/02/2017   T3FREE 3.3 12/08/2016   T3FREE 3.1 03/06/2016   T3FREE 3.2 09/05/2015   T3FREE 3.9 05/07/2015   Pt denies: - feeling nodules in neck -  hoarseness - dysphagia - choking   DM2:  Reviewed HbA1c levels: Lab Results  Component Value Date   HGBA1C 7.5 (H) 07/06/2023   HGBA1C 7.5 (A) 04/01/2023   HGBA1C 7.4 (H) 11/19/2022  07/03/2016: HbA1c 6.8%  He was on: - Metformin  500 mg 2x a day >> stopped 2/2 nausea  - Metformin  ER 1000 mg with dinner >> nausea  Plan on: - Metformin  ER 500 mg twice a day with meals >> 3x a day -he had nausea with taking 1000 mg at the same time >> stopped 08/2021  He continues to limit sweets and to not eat fried foods.  However, he is eating cereal without milk as a snack in the evening. He usually also goes to the gym-Silver sneakers.  He checks his sugars 0-1x a day: - am: 100-165 >> 119 >> 135 >> 132, 133, 149 >> 143 >> 112-140 - 2h after b'fast: 119, 144 >> n/c >> 148, 162 >> 170 >> n/c - lunch: 80, 96-117 >> 106-111 >> 97, 113 >> n/c >> 85-102 - 2h after lunch: 101 >> 81, 126, 173 >> 100-146 >> 88-140 - dinner:  90s-120 >> 94, 114  >> 97-115, 146 >> 110 >> n/c - 2h after dinner: n/c >> <144 - bedtime: n/c Feels sugars <80. Lowest: 94 >> 101 >> 81 >> 90 >> 65  Highest: 144 >> 200 >>  173 >> 170s >> 144  Meter: ReliOn >> ReliOn Premier Blu >> Verio Reflect  + CKD. Last BUN/Cr: Lab Results  Component Value Date   BUN 21 07/06/2023   BUN 21 11/16/2022   BUN 22 06/25/2021   CREATININE 1.04 07/06/2023   CREATININE 1.31 (H) 11/16/2022   CREATININE 1.20 06/25/2021   No results found for: MICRALBCREAT He is off ACE inhibitor/ARB due to history of high potassium.  + HL: Lab Results  Component Value Date   CHOL 166 07/06/2023   HDL 46.00 07/06/2023   LDLCALC 50 07/06/2023   LDLDIRECT 107.0 02/27/2013   TRIG 348.0 (H) 07/06/2023   CHOLHDL 4 07/06/2023  On Crestor  20, changed from Niacin  >> Zetia  10 mg daily. Also on fenofibrate  145 mg daily - restarted 04/2022.  Last eye exam in 2024: reportedly No DR, no glaucoma, + incipient cataracts.  Last foot exam 03/2023.  He sees  Dr Alveta >> had angioplasy x3 in the past, and 2 stents placed. He he has a history of an AMI 12 (mild). He has a Mobitz 1 AVB. Had a catheterization in 10/2016 >> partial blockages. He has OSA and wears a CPAP. He had a colonoscopy 02/2017 >> E coli sepsis.  ROS: + See HPI  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    noted on EKG March 2013   CAD (coronary artery disease)    s/p atherectomy, PTCA/DES x2 mid & distal RCA 06/30/12   Carotid artery occlusion    Chest pain    Complication of anesthesia    heart rate dropping with propofol  last procedure   COPD (chronic obstructive pulmonary disease) (HCC)    Dyspnea    ED (erectile dysfunction)    Esophageal stricture    External hemorrhoids without mention of complication    GERD (gastroesophageal reflux disease)    Hiatal hernia    History of kidney stones    HLD (hyperlipidemia)    HTN (hypertension)    Myocardial infarction (HCC)    1996   Personal history of colonic polyps 03/12/2010   TUBULAR ADENOMA   Pneumonia    Presence of permanent cardiac pacemaker    Sleep apnea    wears CPAP   Tobacco abuse    Tubular adenoma of colon    Type 2 diabetes mellitus (HCC)    Past Surgical History:  Procedure Laterality Date   ANGIOPLASTY     X3   ANTERIOR CERVICAL DECOMP/DISCECTOMY FUSION N/A 11/25/2022   Procedure: Anterior Cervical Decompression/Discectomy Fusion Cervical Three-Four;  Surgeon: Debby Dorn MATSU, MD;  Location: [facility];  Service: Neurosurgery;  Laterality: N/A;   CARDIAC CATHETERIZATION     8 years ago stents   COLONOSCOPY N/A 02/16/2017   Procedure: COLONOSCOPY;  Surgeon: Albertus Gordy HERO, MD;  Location: [facility] ENDOSCOPY;  Service: Gastroenterology;  Laterality: N/A;   COLONOSCOPY     CORONARY ARTERY BYPASS GRAFT N/A 07/19/2020   Procedure: CORONARY ARTERY BYPASS GRAFTING (CABG) TIMES THREE USING LEFT INTERNAL MAMMARY ARTERY AND RIGHT GREATER SAPHENOUS VEIN HARVESTED  ENDOSCOPICALLY;  Surgeon: Lucas Dorise POUR, MD;  Location: [facility] OR;  Service: Open Heart Surgery;  Laterality: N/A;   INSERT / REPLACE / REMOVE PACEMAKER  06/20/2018   LEFT HEART CATH AND CORONARY ANGIOGRAPHY N/A 11/05/2016   Procedure: Left Heart Cath and Coronary Angiography;  Surgeon: Mady Bruckner, MD;  Location: [facility] INVASIVE CV LAB;  Service: Cardiovascular;  Laterality:  N/A;   LEFT HEART CATH AND CORONARY ANGIOGRAPHY N/A 07/12/2020   Procedure: LEFT HEART CATH AND CORONARY ANGIOGRAPHY;  Surgeon: Verlin Lonni BIRCH, MD;  Location: [facility] INVAS[facility] CV LAB;  Service: Cardiovascular;  Laterality: N/A;   LUMBAR LAMINECTOMY/DECOMPRESSION MICRODISCECTOMY N/A 05/12/2018   Procedure: Microlumbar decompression L2-3, possible L1-2;  Surgeon: Duwayne Purchase, MD;  Location: [facility] OR;  Service: Orthopedics;  Laterality: N/A;  2 hrs, Microlumbar decompression L2-3, possible L1-2   PACEMAKER IMPLANT N/A 06/20/2018   Procedure: PACEMAKER IMPLANT;  Surgeon: Waddell Danelle ORN, MD;  Location: [facility] INVAS[facility] CV LAB;  Service: Cardiovascular;  Laterality: N/A;   PERCUTANEOUS CORONARY ROTOBLATOR INTERVENTION (PCI-R) N/A 06/30/2012   Procedure: PERCUTANEOUS CORONARY ROTOBLATOR INTERVENTION (PCI-R);  Surgeon: Lonni BIRCH Verlin, MD;  Location: [facility] CATH LAB;  Service: Cardiovascular;  Laterality: N/A;   TEE WITHOUT CARDIOVERSION N/A 07/19/2020   Procedure: TRANSESOPHAGEAL ECHOCARDIOGRAM (TEE);  Surgeon: Lucas Dorise POUR, MD;  Location: [facility] OR;  Service: Open Heart Surgery;  Laterality: N/A;   VIDEO BRONCHOSCOPY  08/13/2011   Procedure: VIDEO BRONCHOSCOPY WITHOUT FLUORO;  Surgeon: Ozell KATHEE America, MD;  Location: [facility] ENDOSCOPY;  Service: Cardiopulmonary;  Laterality: Bilateral;   History   Social History   Marital Status: Married    Spouse Name: N/A    Number of Children: 1   Occupational History   Regulatory affairs officer    Social History Main Topics   Smoking status: Former Smoker -- 0.50 packs/day for 40 years     Types: Cigarettes    Quit date: 08/25/2011   Smokeless tobacco: Never Used   Alcohol Use: 0.0 oz/week     Comment: rare   Drug Use: No   Current Outpatient Medications on File Prior to Visit  Medication Sig Dispense Refill   acetaminophen  (TYLENOL ) 500 MG tablet Take 1,000 mg by mouth every 6 (six) hours as needed (pain).      albuterol  (VENTOLIN  HFA) 108 (90 Base) MCG/ACT inhaler INHALE 2 PUFFS INTO THE LUNGS EVERY 6 HOURS AS NEEDED FOR WHEEZE OR SHORTNESS OF BREATH 8.5 each 3   aspirin  EC 81 MG tablet Take 1 tablet (81 mg total) by mouth daily. Swallow whole.     Blood Glucose Monitoring Suppl (ONETOUCH VERIO REFLECT) w/Device KIT Use to check blood sugar 1-2 times a day 1 kit 0   ezetimibe  (ZETIA ) 10 MG tablet TAKE 1 TABLET BY MOUTH EVERY DAY 90 tablet 3   famotidine  (PEPCID ) 20 MG tablet Take 20 mg by mouth daily as needed for heartburn.     fenofibrate  (TRICOR ) 145 MG tablet TAKE 1 TABLET BY MOUTH EVERY DAY 90 tablet 3   hydrochlorothiazide  (HYDRODIURIL ) 25 MG tablet TAKE 1/2 TABLET (12.5 MG TOTAL) BY MOUTH DAILY AS NEEDED. 45 tablet 3   ipratropium (ATROVENT ) 0.06 % nasal spray PLACE 2 SPRAYS INTO BOTH NOSTRILS 4 (FOUR) TIMES DAILY AS DIRECTED 90 mL 1   metoprolol  tartrate (LOPRESSOR ) 25 MG tablet TAKE 1 TABLET BY MOUTH TWICE A DAY 180 tablet 1   nitroGLYCERIN  (NITROSTAT ) 0.4 MG SL tablet Place 1 tablet (0.4 mg total) under the tongue every 5 (five) minutes as needed for chest pain. 25 tablet 6   omeprazole  (PRILOSEC) 40 MG capsule Take 1 capsule (40 mg total) by mouth daily. (Patient taking differently: Take 40 mg by mouth daily as needed (acid reflux).) 90 capsule 3   ONETOUCH VERIO test strip Use to check blood sugar 1-2 times a day 200 each 5   Pharmacist Choice Lancets MISC Use to  check blood sugar 1-2 times a day 200 each 5   rosuvastatin  (CRESTOR ) 20 MG tablet TAKE 1 TABLET BY MOUTH EVERY DAY 90 tablet 3   sildenafil  (VIAGRA ) 100 MG tablet Take 1 tablet by mouth prior to sexual  activity as needed 15 tablet 11   No current facility-administered medications on file prior to visit.   Allergies  Allergen Reactions   Amlodipine Besylate Other (See Comments)    Headache, weakness, dizziness   Propofol  Other (See Comments)    Heart rate dropped     Lisinopril  Cough and Rash   Rosuvastatin  Other (See Comments)     Corncern for muscle aches but he is tolerating now    Family History  Problem Relation Age of Onset   Diabetes Mother        and brother    Kidney failure Mother    Heart disease Father        hx of MI   Breast cancer Sister    Colon cancer Neg Hx    Stomach cancer Neg Hx    Esophageal cancer Neg Hx    Rectal cancer Neg Hx    PE: BP 120/60   Pulse 68   Ht 5' 10.5 (1.791 m)   Wt 180 lb 9.6 oz (81.9 kg)   SpO2 96%   BMI 25.55 kg/m  Wt Readings from Last 10 Encounters:  10/29/23 180 lb 9.6 oz (81.9 kg)  10/27/23 182 lb 14.4 oz (83 kg)  10/25/23 182 lb 6.4 oz (82.7 kg)  07/06/23 189 lb (85.7 kg)  04/01/23 185 lb 9.6 oz (84.2 kg)  01/28/23 186 lb (84.4 kg)  12/22/22 183 lb 9.6 oz (83.3 kg)  12/03/22 183 lb 9.6 oz (83.3 kg)  11/25/22 185 lb (83.9 kg)  11/19/22 186 lb 11.2 oz (84.7 kg)   Constitutional: Slightly overweight, in NAD Eyes: no exophthalmos ENT: no masses palpated in neck, no cervical lymphadenopathy Cardiovascular: RRR, No MRG Respiratory: CTA B Musculoskeletal: no deformities Skin:  no rashes Neurological: no tremor with outstretched hands Diabetic Foot Exam - Simple   Simple Foot Form Diabetic Foot exam was performed with the following findings: Yes 10/29/2023  3:50 PM  Visual Inspection No deformities, no ulcerations, no other skin breakdown bilaterally: Yes Sensation Testing Intact to touch and monofilament testing bilaterally: Yes Pulse Check Posterior Tibialis and Dorsalis pulse intact bilaterally: Yes Comments + indurated, thick,  nails, with onychodystrophy    ASSESSMENT: 1. Mild hypothyroidism  2.  DM2, diet- controlled - CAD, s/p CABG 07/19/2020 - Carotid atherosclerosis - per carotid ultrasound 08/2021  3. HL  PLAN:  1. Patient with history of mild hypothyroidism, not on levothyroxine  -  latest TSH level was normal Lab Results  Component Value Date   TSH 3.85 07/06/2023  - He prefers to continue without levothyroxine , if possible - He has no complaints at today's visit and has good energy  2. DM2 -Patient with uncontrolled type 2 diabetes, with worse control in spring 2022 when HbA1c returned elevated at 7.3%.  At that time, he was started on a low-dose metformin  in the hospital, which she could not tolerate due to nausea.  He did not tolerate the extended release formulation of metformin  afterwards but he stopped in 2023 and did not want to restart it.  Sugars were mostly at goal afterwards, but HbA1c increased again due to lack of activity and increased pain around the time of his cervical surgery.  At last visit, HbA1c was 7.5%, stable,  above target.  I did recommend an SGLT2 inhibitor after a discussion about the benefits of this class of medications.  He declined this due to increased medication burden. - At today's visit, sugars are at goal, with only occasional mildly high values in the morning.  Since last visit, he adjusted his diet and is not snacking at night.  Also, he is more active, playing golf.  No need to intensify his regimen for now.  He again confirms that he would not want to start an SGLT2 inhibitor. Patient Instructions  Please check blood sugars 1x a day, rotating check times.  Please return in 6 months with your sugar log.   - we checked his HbA1c: 6.9% (lower) - advised to check sugars at different times of the day - 1x a day, rotating check times - advised for yearly eye exams >> he is UTD - we we will check an ACR today - return to clinic in 6 months  3.  Hyperlipidemia - Latest lipid panel shows an LDL and HDL at goal, but elevated  triglycerides: Lab Results  Component Value Date   CHOL 166 07/06/2023   HDL 46.00 07/06/2023   LDLCALC 50 07/06/2023   LDLDIRECT 107.0 02/27/2013   TRIG 348.0 (H) 07/06/2023   CHOLHDL 4 07/06/2023  - He continues on Crestor  20 mg daily, fenofibrate  145 mg daily and Zetia  10 mg daily with good tolerance  Lela Fendt, MD PhD [facility] Endocrinology

## 2023-10-30 LAB — MICROALBUMIN / CREATININE URINE RATIO
Creatinine, Urine: 68 mg/dL (ref 20–320)
Microalb Creat Ratio: 49 mg/g{creat} — ABNORMAL HIGH (ref <30)
Microalb, Ur: 3.3 mg/dL

## 2023-11-01 ENCOUNTER — Ambulatory Visit: Payer: Self-pay | Admitting: Internal Medicine

## 2023-11-01 ENCOUNTER — Other Ambulatory Visit: Payer: Self-pay | Admitting: *Deleted

## 2023-11-01 DIAGNOSIS — E785 Hyperlipidemia, unspecified: Secondary | ICD-10-CM

## 2023-11-01 DIAGNOSIS — I10 Essential (primary) hypertension: Secondary | ICD-10-CM

## 2023-11-01 NOTE — Progress Notes (Signed)
 Called and made patient aware to have labs done before  visit scheduled with Dr. Kate. Patient verbalized an understanding.

## 2023-11-15 ENCOUNTER — Other Ambulatory Visit: Payer: Self-pay

## 2023-11-15 DIAGNOSIS — I10 Essential (primary) hypertension: Secondary | ICD-10-CM | POA: Diagnosis not present

## 2023-11-15 DIAGNOSIS — E785 Hyperlipidemia, unspecified: Secondary | ICD-10-CM | POA: Diagnosis not present

## 2023-11-15 DIAGNOSIS — E119 Type 2 diabetes mellitus without complications: Secondary | ICD-10-CM | POA: Diagnosis not present

## 2023-11-15 LAB — LIPID PANEL

## 2023-11-16 ENCOUNTER — Ambulatory Visit: Payer: Self-pay | Admitting: Cardiology

## 2023-11-16 LAB — CBC
Hematocrit: 47.5 % (ref 37.5–51.0)
Hemoglobin: 15.3 g/dL (ref 13.0–17.7)
MCH: 30.1 pg (ref 26.6–33.0)
MCHC: 32.2 g/dL (ref 31.5–35.7)
MCV: 93 fL (ref 79–97)
Platelets: 236 x10E3/uL (ref 150–450)
RBC: 5.09 x10E6/uL (ref 4.14–5.80)
RDW: 13.5 % (ref 11.6–15.4)
WBC: 5.6 x10E3/uL (ref 3.4–10.8)

## 2023-11-16 LAB — TSH: TSH: 5.38 u[IU]/mL — AB (ref 0.450–4.500)

## 2023-11-16 LAB — COMPREHENSIVE METABOLIC PANEL WITH GFR
ALT: 17 IU/L (ref 0–44)
AST: 14 IU/L (ref 0–40)
Albumin: 4.1 g/dL (ref 3.8–4.8)
Alkaline Phosphatase: 90 IU/L (ref 44–121)
BUN/Creatinine Ratio: 14 (ref 10–24)
BUN: 17 mg/dL (ref 8–27)
Bilirubin Total: 0.4 mg/dL (ref 0.0–1.2)
CO2: 23 mmol/L (ref 20–29)
Calcium: 9.7 mg/dL (ref 8.6–10.2)
Chloride: 100 mmol/L (ref 96–106)
Creatinine, Ser: 1.2 mg/dL (ref 0.76–1.27)
Globulin, Total: 2.7 g/dL (ref 1.5–4.5)
Glucose: 161 mg/dL — AB (ref 70–99)
Potassium: 4.8 mmol/L (ref 3.5–5.2)
Sodium: 137 mmol/L (ref 134–144)
Total Protein: 6.8 g/dL (ref 6.0–8.5)
eGFR: 64 mL/min/1.73 (ref >59)

## 2023-11-16 LAB — LIPID PANEL
Cholesterol, Total: 163 mg/dL (ref 100–199)
HDL: 54 mg/dL (ref >39)
LDL CALC COMMENT:: 3 ratio (ref 0.0–5.0)
LDL Chol Calc (NIH): 88 mg/dL (ref 0–99)
Triglycerides: 116 mg/dL (ref 0–149)
VLDL Cholesterol Cal: 21 mg/dL (ref 5–40)

## 2023-11-16 LAB — HEMOGLOBIN A1C
Est. average glucose Bld gHb Est-mCnc: 169 mg/dL
Hgb A1c MFr Bld: 7.5 — AB (ref 4.8–5.6)

## 2023-11-17 ENCOUNTER — Encounter: Payer: Self-pay | Admitting: Cardiology

## 2023-11-19 NOTE — Addendum Note (Signed)
 Addended by: BEVELY CONNELL BROCKS on: 11/19/2023 08:05 AM   Modules accepted: Orders

## 2023-11-22 NOTE — Progress Notes (Unsigned)
 Cardiology Office Note:    Date:  11/23/2023   ID:  HENDRICKS SCHWANDT, DOB Jan 03, 1951, MRN [id number]  PCP:  Kennyth Worth HERO, MD  Cardiologist:  Lonni LITTIE Nanas, MD  Electrophysiologist:  Danelle Birmingham, MD   Referring MD: Kennyth Worth HERO, MD   Chief Complaint  Patient presents with   Coronary Artery Disease    History of Present Illness:    Daniel Carey is a 73 y.o. male with a hx of CAD status post CABG, CHB status post PPM, COPD, hypertension, hyperlipidemia, OSA who presents for follow-up.  Previously followed with Dr. Alveta.  Cath 06/2020 showed severe multivessel disease.  Echocardiogram 06/2020 showed EF 55 to 60%.  Underwent CABG on 07/19/2020 with LIMA-LAD, SVG-OM 3, SVG-PDA.  He reports he has been having occasional chest pain.  States that occurs with exertion, describes discomfort in chest that radiates to neck.  Also reports some shortness of breath as well.  Reports having lightheadedness but no syncope.  Denies any lower extremity edema or palpitations.  States that he stopped taking Zetia  as he was having fatigue and thought it may be related.   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    noted on EKG March 2013   CAD (coronary artery disease)    s/p atherectomy, PTCA/DES x2 mid & distal RCA 06/30/12   Carotid artery occlusion    Chest pain    Complication of anesthesia    heart rate dropping with propofol  last procedure   COPD (chronic obstructive pulmonary disease) (HCC)    Dyspnea    ED (erectile dysfunction)    Esophageal stricture    External hemorrhoids without mention of complication    GERD (gastroesophageal reflux disease)    Hiatal hernia    History of kidney stones    HLD (hyperlipidemia)    HTN (hypertension)    Myocardial infarction (HCC)    1996   Personal history of colonic polyps 03/12/2010   TUBULAR ADENOMA   Pneumonia    Presence of permanent cardiac pacemaker    Sleep apnea    wears  CPAP   Tobacco abuse    Tubular adenoma of colon    Type 2 diabetes mellitus (HCC)     Past Surgical History:  Procedure Laterality Date   ANGIOPLASTY     X3   ANTERIOR CERVICAL DECOMP/DISCECTOMY FUSION N/A 11/25/2022   Procedure: Anterior Cervical Decompression/Discectomy Fusion Cervical Three-Four;  Surgeon: Debby Dorn MATSU, MD;  [facility];  Service: Neurosurgery;  Laterality: N/A;   CARDIAC CATHETERIZATION     8 years ago stents   COLONOSCOPY N/A 02/16/2017   Procedure: COLONOSCOPY;  Surgeon: Albertus Gordy HERO, MD;  Location: [facility];  Service: Gastroenterology;  Laterality: N/A;   COLONOSCOPY     CORONARY ARTERY BYPASS GRAFT N/A 07/19/2020   Procedure: CORONARY ARTERY BYPASS GRAFTING (CABG) TIMES THREE USING LEFT INTERNAL MAMMARY ARTERY AND RIGHT GREATER SAPHENOUS VEIN HARVESTED ENDOSCOPICALLY;  Surgeon: Lucas Dorise POUR, MD;  Location: [facility] OR;  Service: Open Heart Surgery;  Laterality: N/A;   INSERT / REPLACE / REMOVE PACEMAKER  06/20/2018   LEFT HEART CATH AND CORONARY ANGIOGRAPHY N/A 11/05/2016   Procedure: Left Heart Cath and Coronary Angiography;  Surgeon: Mady Lonni, MD;  Location: [facility] INVASIVE CV LAB;  Service: Cardiovascular;  Laterality: N/A;   LEFT HEART CATH AND CORONARY ANGIOGRAPHY N/A 07/12/2020   Procedure: LEFT HEART CATH AND CORONARY  ANGIOGRAPHY;  Surgeon: Verlin Lonni BIRCH, MD;  Location: [facility] INVASIVE CV LAB;  Service: Cardiovascular;  Laterality: N/A;   LUMBAR LAMINECTOMY/DECOMPRESSION MICRODISCECTOMY N/A 05/12/2018   Procedure: Microlumbar decompression L2-3, possible L1-2;  Surgeon: Duwayne Purchase, MD;  Location: [facility] OR;  Service: Orthopedics;  Laterality: N/A;  2 hrs, Microlumbar decompression L2-3, possible L1-2   PACEMAKER IMPLANT N/A 06/20/2018   Procedure: PACEMAKER IMPLANT;  Surgeon: Waddell Danelle ORN, MD;  Location: [facility] INVAS[facility] CV LAB;  Service: Cardiovascular;  Laterality: N/A;   PERCUTANEOUS CORONARY ROTOBLATOR INTERVENTION (PCI-R) N/A  06/30/2012   Procedure: PERCUTANEOUS CORONARY ROTOBLATOR INTERVENTION (PCI-R);  Surgeon: Lonni BIRCH Verlin, MD;  Location: [facility] CATH LAB;  Service: Cardiovascular;  Laterality: N/A;   TEE WITHOUT CARDIOVERSION N/A 07/19/2020   Procedure: TRANSESOPHAGEAL ECHOCARDIOGRAM (TEE);  Surgeon: Lucas Dorise POUR, MD;  Location: [facility] OR;  Service: Open Heart Surgery;  Laterality: N/A;   VIDEO BRONCHOSCOPY  08/13/2011   Procedure: VIDEO BRONCHOSCOPY WITHOUT FLUORO;  Surgeon: Ozell KATHEE America, MD;  Location: [facility];  Service: Cardiopulmonary;  Laterality: Bilateral;    Current Medications: Current Meds  Medication Sig   Accu-Chek Softclix Lancets lancets Use as instructed 1x a day   acetaminophen  (TYLENOL ) 500 MG tablet Take 1,000 mg by mouth every 6 (six) hours as needed (pain).    albuterol  (VENTOLIN  HFA) 108 (90 Base) MCG/ACT inhaler INHALE 2 PUFFS INTO THE LUNGS EVERY 6 HOURS AS NEEDED FOR WHEEZE OR SHORTNESS OF BREATH   aspirin  EC 81 MG tablet Take 1 tablet (81 mg total) by mouth daily. Swallow whole.   Blood Glucose Monitoring Suppl (ACCU-CHEK GUIDE) w/Device KIT Use as advised   diclofenac (VOLTAREN) 75 MG EC tablet Take 75 mg by mouth 2 (two) times daily.   famotidine  (PEPCID ) 20 MG tablet Take 20 mg by mouth daily as needed for heartburn.   fenofibrate  (TRICOR ) 145 MG tablet TAKE 1 TABLET BY MOUTH EVERY DAY   glucose blood test strip Use as instructed 1x a day   hydrochlorothiazide  (HYDRODIURIL ) 25 MG tablet TAKE 1/2 TABLET (12.5 MG TOTAL) BY MOUTH DAILY AS NEEDED.   ipratropium (ATROVENT ) 0.06 % nasal spray PLACE 2 SPRAYS INTO BOTH NOSTRILS 4 (FOUR) TIMES DAILY AS DIRECTED   metoprolol  tartrate (LOPRESSOR ) 25 MG tablet TAKE 1 TABLET BY MOUTH TWICE A DAY   omeprazole  (PRILOSEC) 40 MG capsule Take 1 capsule (40 mg total) by mouth daily. (Patient taking differently: Take 40 mg by mouth daily as needed (acid reflux).)   Pharmacist Choice Lancets MISC Use to check blood sugar 1-2 times a day    rosuvastatin  (CRESTOR ) 20 MG tablet TAKE 1 TABLET BY MOUTH EVERY DAY   sildenafil  (VIAGRA ) 100 MG tablet Take 1 tablet by mouth prior to sexual activity as needed   [DISCONTINUED] ezetimibe  (ZETIA ) 10 MG tablet TAKE 1 TABLET BY MOUTH EVERY DAY   [DISCONTINUED] nitroGLYCERIN  (NITROSTAT ) 0.4 MG SL tablet Place 1 tablet (0.4 mg total) under the tongue every 5 (five) minutes as needed for chest pain.     Allergies:   Amlodipine besylate, Propofol , Lisinopril , and Rosuvastatin    Social History   Socioeconomic History   Marital status: Married    Spouse name: Not on file   Number of children: 1   Years of education: Not on file   Highest education level: Not on file  Occupational History   Occupation: Regulatory affairs officer  Tobacco Use   Smoking status: Former    Current packs/day: 0.00    Average packs/day: 0.5 packs/day  for 40.0 years (20.0 ttl pk-yrs)    Types: Cigarettes    Start date: 08/25/1971    Quit date: 08/25/2011    Years since quitting: 12.2   Smokeless tobacco: Never  Vaping Use   Vaping status: Never Used  Substance and Sexual Activity   Alcohol use: Not Currently   Drug use: No   Sexual activity: Not Currently  Other Topics Concern   Not on file  Social History Narrative   Not on file   Social Drivers of Health   Financial Resource Strain: Not on file  Food Insecurity: Low Risk  (07/16/2022)   Received from [facility]   Hunger Vital Sign    Within the past 12 months, you worried that your food would run out before you got money to buy more: Never true    Within the past 12 months, the food you bought just didn't last and you didn't have money to get more. : Never true  Transportation Needs: Not on file (07/16/2022)  Physical Activity: Not on file  Stress: Not on file  Social Connections: Not on file     Family History: The patient's family history includes Breast cancer in his sister; Diabetes in his mother; Heart disease in his father; Kidney failure in  his mother. There is no history of Colon cancer, Stomach cancer, Esophageal cancer, or Rectal cancer.  ROS:   Please see the history of present illness.     All other systems reviewed and are negative.  EKGs/Labs/Other Studies Reviewed:    The following studies were reviewed today:   EKG:   11/23/2023: Atrial sensed, ventricular paced rhythm, rate 66  Recent Labs: 11/15/2023: ALT 17; BUN 17; Creatinine, Ser 1.20; Hemoglobin 15.3; Platelets 236; Potassium 4.8; Sodium 137; TSH 5.380  Recent Lipid Panel    Component Value Date/Time   CHOL 163 11/15/2023 0815   TRIG 116 11/15/2023 0815   HDL 54 11/15/2023 0815   CHOLHDL 3.0 11/15/2023 0815   CHOLHDL 4 07/06/2023 1329   VLDL 69.6 (H) 07/06/2023 1329   LDLCALC 88 11/15/2023 0815   LDLDIRECT 107.0 02/27/2013 0904    Physical Exam:    VS:  BP 136/78   Pulse 66   Wt 185 lb 14.4 oz (84.3 kg)   SpO2 97%   BMI 26.30 kg/m     Wt Readings from Last 3 Encounters:  11/23/23 185 lb 14.4 oz (84.3 kg)  10/29/23 180 lb 9.6 oz (81.9 kg)  10/27/23 182 lb 14.4 oz (83 kg)     GEN:  Well nourished, well developed in no acute distress HEENT: Normal NECK: No JVD; No carotid bruits LYMPHATICS: No lymphadenopathy CARDIAC: RRR, no murmurs, rubs, gallops RESPIRATORY:  Clear to auscultation without rales, wheezing or rhonchi  ABDOMEN: Soft, non-tender, non-distended MUSCULOSKELETAL:  No edema; No deformity  SKIN: Warm and dry NEUROLOGIC:  Alert and oriented x 3 PSYCHIATRIC:  Normal affect   ASSESSMENT:    1. Coronary artery disease involving native coronary artery of native heart, unspecified whether angina present   2. Chest pain of uncertain etiology   3. Essential hypertension   4. Pacemaker   5. Hyperlipidemia, unspecified hyperlipidemia type    PLAN:    CAD: Cath 06/2020 showed severe multivessel disease.  Echocardiogram 06/2020 showed EF 55 to 60%.  Underwent CABG on 07/19/2020 with LIMA-LAD, SVG-OM 3, SVG-PDA. - Continue aspirin  81  mg daily - Continue rosuvastatin  20 mg daily and Zetia  10 mg daily - Continue metoprolol  25 mg  twice daily - He is reporting chest pain concerning for angina, as describes exertional chest pressure.  Check stress PET.  As needed NTG  CHB status post PPM.  Recent device interrogation showed 99% RV pacing.  Update echocardiogram  Hypertension: Continue metoprolol  25 mg twice daily  Hyperlipidemia: LDL 88 on 11/15/2023, on rosuvastatin  20 mg daily.  He had stopped taking Zetia  due to fatigue.  Unclear if this was coming from Zetia .  Recommend restarting Zetia  and monitoring symptoms  T2DM: A1c 7.5%, follows with endocrinology  OSA: On CPAP  RTC in 3 months  Informed Consent   Shared Decision Making/Informed Consent The risks [chest pain, shortness of breath, cardiac arrhythmias, dizziness, blood pressure fluctuations, myocardial infarction, stroke/transient ischemic attack, nausea, vomiting, allergic reaction, radiation exposure, metallic taste sensation and life-threatening complications (estimated to be 1 in 10,000)], benefits (risk stratification, diagnosing coronary artery disease, treatment guidance) and alternatives of a cardiac PET stress test were discussed in detail with Mr. Milledge and he agrees to proceed.      Medication Adjustments/Labs and Tests Ordered: Current medicines are reviewed at length with the patient today.  Concerns regarding medicines are outlined above.  Orders Placed This Encounter  Procedures   NM PET CT CARDIAC PERFUSION MULTI W/ABSOLUTE BLOODFLOW   EKG 12-Lead   ECHOCARDIOGRAM COMPLETE   Meds ordered this encounter  Medications   nitroGLYCERIN  (NITROSTAT ) 0.4 MG SL tablet    Sig: Place 1 tablet (0.4 mg total) under the tongue every 5 (five) minutes as needed for chest pain.    Dispense:  25 tablet    Refill:  3   ezetimibe  (ZETIA ) 10 MG tablet    Sig: Take 1 tablet (10 mg total) by mouth daily.    Dispense:  90 tablet    Refill:  3    Patient  Instructions  Medication Instructions:  Continue current medications Restart Zetia  10 mg as directed by your provider *If you need a refill on your cardiac medications before your next appointment, please call your pharmacy*  Lab Work:  none  If you have labs (blood work) drawn today and your tests are completely normal, you will receive your results only by: MyChart Message (if you have MyChart) OR A paper copy in the mail If you have any lab test that is abnormal or we need to change your treatment, we will call you to review the results.  Testing/Procedures:    Please report to Radiology at the [facility] Main Entrance 30 minutes early for your test.  [address] [place], [place] [address]                          How to Prepare for Your Cardiac PET/CT Stress Test:  Nothing to eat or drink, except water, 3 hours prior to arrival time.  NO caffeine/decaffeinated products, or chocolate 12 hours prior to arrival. (Please note decaffeinated beverages (teas/coffees) still contain caffeine).  If you have caffeine within 12 hours prior, the test will need to be rescheduled.  Medication instructions: Do not take erectile dysfunction medications for 72 hours prior to test (sildenafil , tadalafil) Do not take nitrates (isosorbide  mononitrate, Ranexa ) the day before or day of test Do not take tamsulosin the day before or morning of test Hold theophylline containing medications for 12 hours. Hold Dipyridamole 48 hours prior to the test.  Diabetic Preparation: If able to eat breakfast prior to 3 hour fasting, you may take all  medications, including your insulin . Do not worry if you miss your breakfast dose of insulin  - start at your next meal. If you do not eat prior to 3 hour fast-Hold all diabetes (oral and insulin ) medications. Patients who wear a continuous glucose monitor MUST remove the device prior to scanning.  You may take your remaining medications with  water.  NO perfume, cologne or lotion on chest or abdomen area.   Total time is 1 to 2 hours; you may want to bring reading material for the waiting time.  IF YOU THINK YOU MAY BE PREGNANT, OR ARE NURSING PLEASE INFORM THE TECHNOLOGIST.  In preparation for your appointment, medication and supplies will be purchased.  Appointment availability is limited, so if you need to cancel or reschedule, please call the Radiology Department Scheduler at [phone] 24 hours in advance to avoid a cancellation fee of $100.00  What to Expect When you Arrive:  Once you arrive and check in for your appointment, you will be taken to a preparation room within the Radiology Department.  A technologist or Nurse will obtain your medical history, verify that you are correctly prepped for the exam, and explain the procedure.  Afterwards, an IV will be started in your arm and electrodes will be placed on your skin for EKG monitoring during the stress portion of the exam. Then you will be escorted to the PET/CT scanner.  There, staff will get you positioned on the scanner and obtain a blood pressure and EKG.  During the exam, you will continue to be connected to the EKG and blood pressure machines.  A small, safe amount of a radioactive tracer will be injected in your IV to obtain a series of pictures of your heart along with an injection of a stress agent.    After your Exam:  It is recommended that you eat a meal and drink a caffeinated beverage to counter act any effects of the stress agent.  Drink plenty of fluids for the remainder of the day and urinate frequently for the first couple of hours after the exam.  Your doctor will inform you of your test results within 7-10 business days.  For more information and frequently asked questions, please visit our website: [email or web address]  For questions about your test or how to prepare for your test, please call: Cardiac Imaging Nurse  Navigators Office: [phone]   Follow-Up: At [facility], you and your health needs are our priority.  As part of our continuing mission to provide you with exceptional heart care, our providers are all part of one team.  This team includes your primary Cardiologist (physician) and Advanced Practice Providers or APPs (Physician Assistants and Nurse Practitioners) who all work together to provide you with the care you need, when you need it.  Your next appointment:   3 month(s)  Provider:   Lonni LITTIE Nanas, MD    We recommend signing up for the patient portal called MyChart.  Sign up information is provided on this After Visit Summary.  MyChart is used to connect with patients for Virtual Visits (Telemedicine).  Patients are able to view lab/test results, encounter notes, upcoming appointments, etc.  Non-urgent messages can be sent to your provider as well.   To learn more about what you can do with MyChart, go to [email or web address].   Other Instructions     Echo  Your physician has requested that you have an echocardiogram. Echocardiography is a painless test that uses sound waves to create  images of your heart. It provides your doctor with information about the size and shape of your heart and how well your heart's chambers and valves are working. This procedure takes approximately one hour. There are no restrictions for this procedure. Please do NOT wear cologne, perfume, aftershave, or lotions (deodorant is allowed). Please arrive 15 minutes prior to your appointment time.  Please note: We ask at that you not bring children with you during ultrasound (echo/ vascular) testing. Due to room size and safety concerns, children are not allowed in the ultrasound rooms during exams. Our front office staff cannot provide observation of children in our lobby area while testing is being conducted. An adult accompanying a patient to their appointment will only be allowed in  the ultrasound room at the discretion of the ultrasound technician under special circumstances. We apologize for any inconvenience.    Signed, Lonni LITTIE Nanas, MD  11/23/2023 5:38 PM    [facility]

## 2023-11-23 ENCOUNTER — Encounter: Payer: Self-pay | Admitting: Cardiology

## 2023-11-23 ENCOUNTER — Ambulatory Visit: Attending: Cardiovascular Disease | Admitting: Cardiology

## 2023-11-23 VITALS — BP 136/78 | HR 66 | Wt 185.9 lb

## 2023-11-23 DIAGNOSIS — I10 Essential (primary) hypertension: Secondary | ICD-10-CM | POA: Diagnosis not present

## 2023-11-23 DIAGNOSIS — Z950 Presence of cardiac pacemaker: Secondary | ICD-10-CM | POA: Diagnosis not present

## 2023-11-23 DIAGNOSIS — I2510 Atherosclerotic heart disease of native coronary artery without angina pectoris: Secondary | ICD-10-CM

## 2023-11-23 DIAGNOSIS — E785 Hyperlipidemia, unspecified: Secondary | ICD-10-CM | POA: Diagnosis not present

## 2023-11-23 DIAGNOSIS — R079 Chest pain, unspecified: Secondary | ICD-10-CM | POA: Diagnosis not present

## 2023-11-23 MED ORDER — EZETIMIBE 10 MG PO TABS: 10.0000 mg | ORAL_TABLET | Freq: Every day | ORAL | 3 refills | Status: AC

## 2023-11-23 MED ORDER — NITROGLYCERIN 0.4 MG SL SUBL
0.4000 mg | SUBLINGUAL_TABLET | SUBLINGUAL | 3 refills | Status: AC | PRN
Start: 2023-11-23 — End: ?

## 2023-11-23 NOTE — Patient Instructions (Addendum)
 Medication Instructions:  Continue current medications Restart Zetia  10 mg as directed by your provider *If you need a refill on your cardiac medications before your next appointment, please call your pharmacy*  Lab Work:  none  If you have labs (blood work) drawn today and your tests are completely normal, you will receive your results only by: MyChart Message (if you have MyChart) OR A paper copy in the mail If you have any lab test that is abnormal or we need to change your treatment, we will call you to review the results.  Testing/Procedures:    Please report to Radiology at the [facility] Main Entrance 30 minutes early for your test.  [address] [place], [place] [address]                          How to Prepare for Your Cardiac PET/CT Stress Test:  Nothing to eat or drink, except water, 3 hours prior to arrival time.  NO caffeine/decaffeinated products, or chocolate 12 hours prior to arrival. (Please note decaffeinated beverages (teas/coffees) still contain caffeine).  If you have caffeine within 12 hours prior, the test will need to be rescheduled.  Medication instructions: Do not take erectile dysfunction medications for 72 hours prior to test (sildenafil , tadalafil) Do not take nitrates (isosorbide  mononitrate, Ranexa ) the day before or day of test Do not take tamsulosin the day before or morning of test Hold theophylline containing medications for 12 hours. Hold Dipyridamole 48 hours prior to the test.  Diabetic Preparation: If able to eat breakfast prior to 3 hour fasting, you may take all medications, including your insulin . Do not worry if you miss your breakfast dose of insulin  - start at your next meal. If you do not eat prior to 3 hour fast-Hold all diabetes (oral and insulin ) medications. Patients who wear a continuous glucose monitor MUST remove the device prior to scanning.  You may take your remaining medications with water.  NO perfume,  cologne or lotion on chest or abdomen area.   Total time is 1 to 2 hours; you may want to bring reading material for the waiting time.  IF YOU THINK YOU MAY BE PREGNANT, OR ARE NURSING PLEASE INFORM THE TECHNOLOGIST.  In preparation for your appointment, medication and supplies will be purchased.  Appointment availability is limited, so if you need to cancel or reschedule, please call the Radiology Department Scheduler at [phone] 24 hours in advance to avoid a cancellation fee of $100.00  What to Expect When you Arrive:  Once you arrive and check in for your appointment, you will be taken to a preparation room within the Radiology Department.  A technologist or Nurse will obtain your medical history, verify that you are correctly prepped for the exam, and explain the procedure.  Afterwards, an IV will be started in your arm and electrodes will be placed on your skin for EKG monitoring during the stress portion of the exam. Then you will be escorted to the PET/CT scanner.  There, staff will get you positioned on the scanner and obtain a blood pressure and EKG.  During the exam, you will continue to be connected to the EKG and blood pressure machines.  A small, safe amount of a radioactive tracer will be injected in your IV to obtain a series of pictures of your heart along with an injection of a stress agent.    After your Exam:  It is recommended  that you eat a meal and drink a caffeinated beverage to counter act any effects of the stress agent.  Drink plenty of fluids for the remainder of the day and urinate frequently for the first couple of hours after the exam.  Your doctor will inform you of your test results within 7-10 business days.  For more information and frequently asked questions, please visit our website: [email or web address]  For questions about your test or how to prepare for your test, please call: Cardiac Imaging Nurse Navigators Office: [phone]    Follow-Up: At [facility], you and your health needs are our priority.  As part of our continuing mission to provide you with exceptional heart care, our providers are all part of one team.  This team includes your primary Cardiologist (physician) and Advanced Practice Providers or APPs (Physician Assistants and Nurse Practitioners) who all work together to provide you with the care you need, when you need it.  Your next appointment:   3 month(s)  Provider:   Lonni LITTIE Nanas, MD    We recommend signing up for the patient portal called MyChart.  Sign up information is provided on this After Visit Summary.  MyChart is used to connect with patients for Virtual Visits (Telemedicine).  Patients are able to view lab/test results, encounter notes, upcoming appointments, etc.  Non-urgent messages can be sent to your provider as well.   To learn more about what you can do with MyChart, go to [email or web address].   Other Instructions     Echo  Your physician has requested that you have an echocardiogram. Echocardiography is a painless test that uses sound waves to create images of your heart. It provides your doctor with information about the size and shape of your heart and how well your heart's chambers and valves are working. This procedure takes approximately one hour. There are no restrictions for this procedure. Please do NOT wear cologne, perfume, aftershave, or lotions (deodorant is allowed). Please arrive 15 minutes prior to your appointment time.  Please note: We ask at that you not bring children with you during ultrasound (echo/ vascular) testing. Due to room size and safety concerns, children are not allowed in the ultrasound rooms during exams. Our front office staff cannot provide observation of children in our lobby area while testing is being conducted. An adult accompanying a patient to their appointment will only be allowed in the ultrasound room at the  discretion of the ultrasound technician under special circumstances. We apologize for any inconvenience.

## 2023-11-24 NOTE — Addendum Note (Signed)
 Addended by: VICCI SELLER A on: 11/24/2023 10:24 AM   Modules accepted: Orders

## 2023-11-24 NOTE — Progress Notes (Signed)
 Remote pacemaker transmission.

## 2023-12-29 ENCOUNTER — Ambulatory Visit (HOSPITAL_COMMUNITY)
Admission: RE | Admit: 2023-12-29 | Discharge: 2023-12-29 | Disposition: A | Discharge status: Home / Self Care | Source: Ambulatory Visit | Attending: Cardiology | Admitting: Cardiology

## 2023-12-29 DIAGNOSIS — R079 Chest pain, unspecified: Secondary | ICD-10-CM | POA: Diagnosis not present

## 2023-12-29 LAB — ECHOCARDIOGRAM COMPLETE
Area-P 1/2: 2.13 cm2
S' Lateral: 2.63 cm

## 2023-12-30 ENCOUNTER — Ambulatory Visit

## 2023-12-30 ENCOUNTER — Encounter: Payer: Self-pay | Admitting: Family Medicine

## 2023-12-30 ENCOUNTER — Ambulatory Visit: Admitting: Family Medicine

## 2023-12-30 ENCOUNTER — Ambulatory Visit: Payer: Self-pay | Admitting: Cardiology

## 2023-12-30 VITALS — BP 134/68 | HR 60 | Temp 97.3°F | Ht 70.0 in | Wt 184.6 lb

## 2023-12-30 DIAGNOSIS — I6523 Occlusion and stenosis of bilateral carotid arteries: Secondary | ICD-10-CM | POA: Diagnosis not present

## 2023-12-30 DIAGNOSIS — I10 Essential (primary) hypertension: Secondary | ICD-10-CM | POA: Diagnosis not present

## 2023-12-30 DIAGNOSIS — R197 Diarrhea, unspecified: Secondary | ICD-10-CM

## 2023-12-30 DIAGNOSIS — Z0001 Encounter for general adult medical examination with abnormal findings: Secondary | ICD-10-CM

## 2023-12-30 DIAGNOSIS — E785 Hyperlipidemia, unspecified: Secondary | ICD-10-CM | POA: Diagnosis not present

## 2023-12-30 DIAGNOSIS — H5789 Other specified disorders of eye and adnexa: Secondary | ICD-10-CM | POA: Diagnosis not present

## 2023-12-30 DIAGNOSIS — I2510 Atherosclerotic heart disease of native coronary artery without angina pectoris: Secondary | ICD-10-CM | POA: Diagnosis not present

## 2023-12-30 DIAGNOSIS — Z125 Encounter for screening for malignant neoplasm of prostate: Secondary | ICD-10-CM

## 2023-12-30 LAB — CUP PACEART REMOTE DEVICE CHECK
Battery Remaining Longevity: 47 mo
Battery Remaining Percentage: 44 %
Battery Voltage: 2.98 V
Brady Statistic AP VP Percent: 8.7 %
Brady Statistic AP VS Percent: 1 %
Brady Statistic AS VP Percent: 91 %
Brady Statistic AS VS Percent: 1 %
Brady Statistic RA Percent Paced: 8 %
Brady Statistic RV Percent Paced: 99 %
Date Time Interrogation Session: 20250911020012
Implantable Lead Connection Status: 753985
Implantable Lead Connection Status: 753985
Implantable Lead Implant Date: 20200302
Implantable Lead Implant Date: 20200302
Implantable Lead Location: 753859
Implantable Lead Location: 753860
Implantable Lead Model: 3830
Implantable Pulse Generator Implant Date: 20200302
Lead Channel Impedance Value: 460 Ohm
Lead Channel Impedance Value: 530 Ohm
Lead Channel Pacing Threshold Amplitude: 0.5 V
Lead Channel Pacing Threshold Amplitude: 0.75 V
Lead Channel Pacing Threshold Pulse Width: 0.5 ms
Lead Channel Pacing Threshold Pulse Width: 0.5 ms
Lead Channel Sensing Intrinsic Amplitude: 12 mV
Lead Channel Sensing Intrinsic Amplitude: 4 mV
Lead Channel Setting Pacing Amplitude: 2 V
Lead Channel Setting Pacing Amplitude: 2.5 V
Lead Channel Setting Pacing Pulse Width: 0.5 ms
Lead Channel Setting Sensing Sensitivity: 4 mV
Pulse Gen Model: 2272
Pulse Gen Serial Number: 9114136

## 2023-12-30 LAB — PSA: PSA: 0.49 ng/mL (ref 0.10–4.00)

## 2023-12-30 LAB — LIPID PANEL
Cholesterol: 116 mg/dL (ref 0–200)
HDL: 40.2 mg/dL (ref >39.00)
LDL Cholesterol: 46 mg/dL (ref 0–99)
NonHDL: 75.76
Total CHOL/HDL Ratio: 3
Triglycerides: 150 mg/dL — ABNORMAL HIGH (ref 0.0–149.0)
VLDL: 30 mg/dL (ref 0.0–40.0)

## 2023-12-30 LAB — COMPREHENSIVE METABOLIC PANEL WITH GFR
ALT: 19 U/L (ref 0–53)
AST: 14 U/L (ref 0–37)
Albumin: 3.9 g/dL (ref 3.5–5.2)
Alkaline Phosphatase: 55 U/L (ref 39–117)
BUN: 21 mg/dL (ref 6–23)
CO2: 28 meq/L (ref 19–32)
Calcium: 9.6 mg/dL (ref 8.4–10.5)
Chloride: 100 meq/L (ref 96–112)
Creatinine, Ser: 1.2 mg/dL (ref 0.40–1.50)
GFR: 60.57 mL/min (ref >60.00)
Glucose, Bld: 88 mg/dL (ref 70–99)
Potassium: 4.2 meq/L (ref 3.5–5.1)
Sodium: 136 meq/L (ref 135–145)
Total Bilirubin: 0.6 mg/dL (ref 0.2–1.2)
Total Protein: 6.5 g/dL (ref 6.0–8.3)

## 2023-12-30 LAB — CBC
HCT: 41.9 % (ref 39.0–52.0)
Hemoglobin: 14.2 g/dL (ref 13.0–17.0)
MCHC: 33.8 g/dL (ref 30.0–36.0)
MCV: 89.1 fl (ref 78.0–100.0)
Platelets: 216 K/uL (ref 150.0–400.0)
RBC: 4.7 Mil/uL (ref 4.22–5.81)
RDW: 13.8 % (ref 11.5–15.5)
WBC: 5.8 K/uL (ref 4.0–10.5)

## 2023-12-30 LAB — TSH: TSH: 3.69 u[IU]/mL (ref 0.35–5.50)

## 2023-12-30 NOTE — Assessment & Plan Note (Signed)
 Follows with cardiology.  Will check labs today.  He is currently holding his Tricor  but has been consistent with Crestor  20 mg daily and Zetia  10 mg daily.

## 2023-12-30 NOTE — Assessment & Plan Note (Signed)
Continue management per cardiology. 

## 2023-12-30 NOTE — Patient Instructions (Signed)
 It was very nice to see you today!  VISIT SUMMARY: You came in today for a routine wellness visit and to discuss your recent issues with diarrhea and eye discomfort. We also reviewed your history of coronary artery disease and hypertension.  YOUR PLAN: DIARRHEA: You have been experiencing diarrhea since last Wednesday, which has improved with Imodium. -Continue taking Imodium as needed. -We will test a stool sample for infection -Consider taking a fiber supplement and probiotics.  EYE DISCOMFORT: You have been feeling like your eyes are 'glued together' when you wake up at night. -We suspect this might be due to incomplete eyelid closure during sleep. -You will be referred to Dr. Marcey at [facility] for an ophthalmology evaluation.  HYPERTENSION: You have hypertension and have experienced low blood pressure readings after physical activities. -Continue to monitor your blood pressure readings. -Discuss with your cardiologist about the potential need for pacemaker adjustment.  HYPERLIPIDEMIA: You have high cholesterol levels managed with Crestor  and Zetia . -Continue taking Crestor  and Zetia . -We will do blood work to assess your lipid levels.  GENERAL HEALTH MAINTENANCE: Routine wellness visit to discuss overall health, diet, and exercise. -We performed a physical examination and ordered routine blood work including LDL, HDL, and blood sugar. -We will also check your PSA level.  Return in about 1 year (around 12/29/2024) for Annual Physical.   Take care, Dr Kennyth  PLEASE NOTE:  If you had any lab tests, please let us  know if you have not heard back within a few days. You may see your results on mychart before we have a chance to review them but we will give you a call once they are reviewed by us .   If we ordered any referrals today, please let us  know if you have not heard from their office within the next week.   If you had any urgent prescriptions sent in today, please  check with the pharmacy within an hour of our visit to make sure the prescription was transmitted appropriately.   Please try these tips to maintain a healthy lifestyle:  Eat at least 3 REAL meals and 1-2 snacks per day.  Aim for no more than 5 hours between eating.  If you eat breakfast, please do so within one hour of getting up.   Each meal should contain half fruits/vegetables, one quarter protein, and one quarter carbs (no bigger than a computer mouse)  Cut down on sweet beverages. This includes juice, soda, and sweet tea.   Drink at least 1 glass of water with each meal and aim for at least 8 glasses per day  Exercise at least 150 minutes every week.    Preventive Care 35 Years and Older, Male Preventive care refers to lifestyle choices and visits with your health care provider that can promote health and wellness. Preventive care visits are also called wellness exams. What can I expect for my preventive care visit? Counseling During your preventive care visit, your health care provider may ask about your: Medical history, including: Past medical problems. Family medical history. History of falls. Current health, including: Emotional well-being. Home life and relationship well-being. Sexual activity. Memory and ability to understand (cognition). Lifestyle, including: Alcohol, nicotine or tobacco, and drug use. Access to firearms. Diet, exercise, and sleep habits. Work and work Astronomer. Sunscreen use. Safety issues such as seatbelt and bike helmet use. Physical exam Your health care provider will check your: Height and weight. These may be used to calculate your BMI (body mass index).  BMI is a measurement that tells if you are at a healthy weight. Waist circumference. This measures the distance around your waistline. This measurement also tells if you are at a healthy weight and may help predict your risk of certain diseases, such as type 2 diabetes and high blood  pressure. Heart rate and blood pressure. Body temperature. Skin for abnormal spots. What immunizations do I need?  Vaccines are usually given at various ages, according to a schedule. Your health care provider will recommend vaccines for you based on your age, medical history, and lifestyle or other factors, such as travel or where you work. What tests do I need? Screening Your health care provider may recommend screening tests for certain conditions. This may include: Lipid and cholesterol levels. Diabetes screening. This is done by checking your blood sugar (glucose) after you have not eaten for a while (fasting). Hepatitis C test. Hepatitis B test. HIV (human immunodeficiency virus) test. STI (sexually transmitted infection) testing, if you are at risk. Lung cancer screening. Colorectal cancer screening. Prostate cancer screening. Abdominal aortic aneurysm (AAA) screening. You may need this if you are a current or former smoker. Talk with your health care provider about your test results, treatment options, and if necessary, the need for more tests. Follow these instructions at home: Eating and drinking  Eat a diet that includes fresh fruits and vegetables, whole grains, lean protein, and low-fat dairy products. Limit your intake of foods with high amounts of sugar, saturated fats, and salt. Take vitamin and mineral supplements as recommended by your health care provider. Do not drink alcohol if your health care provider tells you not to drink. If you drink alcohol: Limit how much you have to 0-2 drinks a day. Know how much alcohol is in your drink. In the [place] one drink equals one 12 oz bottle of beer (355 mL), one 5 oz glass of wine (148 mL), or one 1 oz glass of hard liquor (44 mL). Lifestyle Brush your teeth every morning and night with fluoride toothpaste. Floss one time each day. Exercise for at least 30 minutes 5 or more days each week. Do not use any products that  contain nicotine or tobacco. These products include cigarettes, chewing tobacco, and vaping devices, such as e-cigarettes. If you need help quitting, ask your health care provider. Do not use drugs. If you are sexually active, practice safe sex. Use a condom or other form of protection to prevent STIs. Take aspirin  only as told by your health care provider. Make sure that you understand how much to take and what form to take. Work with your health care provider to find out whether it is safe and beneficial for you to take aspirin  daily. Ask your health care provider if you need to take a cholesterol-lowering medicine (statin). Find healthy ways to manage stress, such as: Meditation, yoga, or listening to music. Journaling. Talking to a trusted person. Spending time with friends and family. Safety Always wear your seat belt while driving or riding in a vehicle. Do not drive: If you have been drinking alcohol. Do not ride with someone who has been drinking. When you are tired or distracted. While texting. If you have been using any mind-altering substances or drugs. Wear a helmet and other protective equipment during sports activities. If you have firearms in your house, make sure you follow all gun safety procedures. Minimize exposure to UV radiation to reduce your risk of skin cancer. What's next? Visit your health care provider  once a year for an annual wellness visit. Ask your health care provider how often you should have your eyes and teeth checked. Stay up to date on all vaccines. This information is not intended to replace advice given to you by your health care provider. Make sure you discuss any questions you have with your health care provider. Document Revised: 10/02/2020 Document Reviewed: 10/02/2020 Elsevier Patient Education  2024 [employer].

## 2023-12-30 NOTE — Progress Notes (Signed)
 Chief Complaint:  Daniel Carey is a 73 y.o. male who presents today for his annual comprehensive physical exam.    Assessment/Plan:  New/Acute Problems: Diarrhea Reassuring exam.  No red flags though given that symptoms are persistent for the last 10 days we will check stool sample to rule out infectious etiology.  We did discuss conservative measures.  It is okay for him to use Imodium as needed.  Also recommended probiotics and fiber supplements.  If not improving with this may need referral to GI.  Eye Irritation  Unclear etiology.  Reassuring exam today.  Will place referral to ophthalmology per patient request.  Chronic Problems Addressed Today: Essential hypertension Blood pressure at goal today on metoprolol  to tartrate 12.5 mg twice daily.  He has been having postexercise hypotensive episodes and is following with cardiology for this.  Has upcoming nuclear stress test.  Hyperlipidemia Follows with cardiology.  Will check labs today.  He is currently holding his Tricor  but has been consistent with Crestor  20 mg daily and Zetia  10 mg daily.  CAD (coronary artery disease) Continue management per cardiology.  Preventative Healthcare: Patient will get flu vaccine later this season.  Due for colonoscopy in a couple of months.  Check labs today.  Patient Counseling(The following topics were reviewed and/or handout was given):  -Nutrition: Stressed importance of moderation in sodium/caffeine intake, saturated fat and cholesterol, caloric balance, sufficient intake of fresh fruits, vegetables, and fiber.  -Stressed the importance of regular exercise.   -Substance Abuse: Discussed cessation/primary prevention of tobacco, alcohol, or other drug use; driving or other dangerous activities under the influence; availability of treatment for abuse.   -Injury prevention: Discussed safety belts, safety helmets, smoke detector, smoking near bedding or upholstery.   -Sexuality: Discussed  sexually transmitted diseases, partner selection, use of condoms, avoidance of unintended pregnancy and contraceptive alternatives.   -Dental health: Discussed importance of regular tooth brushing, flossing, and dental visits.  -Health maintenance and immunizations reviewed. Please refer to Health maintenance section.  Return to care in 1 year for next preventative visit.     Subjective:  HPI:  He has no acute complaints today. Patient is here today for his  annual physical.  See assessment / plan for status of chronic conditions.  Discussed the use of AI scribe software for clinical note transcription with the patient, who gave verbal consent to proceed.  History of Present Illness Daniel Carey is a 73 year old male who presents with diarrhea and eye discomfort.  He has been experiencing diarrhea that began over a week ago on Wednesday. Symptoms decreased on the previous day after taking Imodium, which he believes helped alleviate the condition. He is concerned that the diarrhea might return at any time. He recalls eating frozen hamburger sliders on Tuesday night, which his wife mentioned did not smell right, but she did not experience any symptoms.  He describes a sensation of his eyes feeling 'glued together' upon waking at night, requiring effort to open them. This has been occurring for about a month and a half. No itchiness, dryness, or discharge, although he mentions a sensation of 'junk' in his eyes. He uses a CPAP machine but has not used it in the past few days due to diarrhea.  He has a history of coronary artery disease and underwent coronary artery bypass grafting (CABG) in May 2022. He is under the care of a new cardiologist following the retirement of his previous cardiologist. He recently had an echocardiogram  and is scheduled for a nuclear stress PET scan in October. He experiences low blood pressure readings, particularly after physical activities like golf or yard work,  with readings as low as 90/50 mmHg, but denies any associated lightheadedness. He is currently taking Crestor  and Zetia  but has stopped Tricor  due to adverse effects when combined with Zetia .  He maintains an active lifestyle, enjoying golf and yard work, and tries to eat healthily. He aims for about 30 minutes of exercise per day and avoids sweets. He has a pacemaker.       12/30/2023    1:54 PM  Depression screen PHQ 2/9  Decreased Interest 0  Down, Depressed, Hopeless 0  PHQ - 2 Score 0    Health Maintenance Due  Topic Date Due   Medicare Annual Wellness (AWV)  Never done   Hepatitis C Screening  Never done   Lung Cancer Screening  07/13/2012   Colonoscopy  02/29/2024     ROS: Per HPI, otherwise a complete review of systems was negative.   PMH:  The following were reviewed and entered/updated in epic: 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    noted on EKG March 2013   CAD (coronary artery disease)    s/p atherectomy, PTCA/DES x2 mid & distal RCA 06/30/12   Carotid artery occlusion    Chest pain    Complication of anesthesia    heart rate dropping with propofol  last procedure   COPD (chronic obstructive pulmonary disease) (HCC)    Dyspnea    ED (erectile dysfunction)    Esophageal stricture    External hemorrhoids without mention of complication    GERD (gastroesophageal reflux disease)    Hiatal hernia    History of kidney stones    HLD (hyperlipidemia)    HTN (hypertension)    Myocardial infarction (HCC)    1996   Personal history of colonic polyps 03/12/2010   TUBULAR ADENOMA   Pneumonia    Presence of permanent cardiac pacemaker    Sleep apnea    wears CPAP   Tobacco abuse    Tubular adenoma of colon    Type 2 diabetes mellitus (HCC)    Patient Active Problem List   Diagnosis Date Noted   Cervical radiculopathy 12/22/2022   Carotid artery stenosis 06/12/2022   Pain of left hip joint 11/24/2021   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   Pacemaker 06/22/2019   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   Spinal stenosis of lumbar region 05/12/2018   Obstructive sleep apnea 04/26/2014   Heart block 07/28/2011   Diverticulosis of colon 07/08/2011   History of colonic polyps 12/23/2010   HIATAL HERNIA 12/22/2007   Hyperlipidemia 12/30/2006   Essential hypertension 12/30/2006   CAD (coronary artery disease) 12/30/2006   GERD 12/30/2006   Past Surgical History:  Procedure Laterality Date   ANGIOPLASTY     X3   ANTERIOR CERVICAL DECOMP/DISCECTOMY FUSION N/A 11/25/2022   Procedure: Anterior Cervical Decompression/Discectomy Fusion Cervical Three-Four;  Surgeon: Debby Dorn MATSU, MD;  Location: [facility] OR;  Service: Neurosurgery;  Laterality: N/A;   CARDIAC CATHETERIZATION     8 years ago stents   COLONOSCOPY N/A 02/16/2017   Procedure: COLONOSCOPY;  Surgeon: Albertus Gordy HERO, MD;  Location: [facility] ENDOSCOPY;  Service: Gastroenterology;  Laterality: N/A;   COLONOSCOPY     CORONARY ARTERY BYPASS GRAFT N/A 07/19/2020   Procedure: CORONARY  ARTERY BYPASS GRAFTING (CABG) TIMES THREE USING LEFT INTERNAL MAMMARY ARTERY AND RIGHT GREATER SAPHENOUS VEIN HARVESTED ENDOSCOPICALLY;  Surgeon: Lucas Dorise POUR, MD;  Location: [facility] OR;  Service: Open Heart Surgery;  Laterality: N/A;   INSERT / REPLACE / REMOVE PACEMAKER  06/20/2018   LEFT HEART CATH AND CORONARY ANGIOGRAPHY N/A 11/05/2016   Procedure: Left Heart Cath and Coronary Angiography;  Surgeon: Mady Bruckner, MD;  Location: [facility] INVASIVE CV LAB;  Service: Cardiovascular;  Laterality: N/A;   LEFT HEART CATH AND CORONARY ANGIOGRAPHY N/A 07/12/2020   Procedure: LEFT HEART CATH AND CORONARY ANGIOGRAPHY;  Surgeon: Verlin Bruckner BIRCH, MD;  Location: [facility] INVASIVE CV LAB;  Service: Cardiovascular;  Laterality: N/A;   LUMBAR LAMINECTOMY/DECOMPRESSION MICRODISCECTOMY N/A 05/12/2018   Procedure: Microlumbar decompression L2-3, possible L1-2;  Surgeon: Duwayne Purchase, MD;  Location: [facility] OR;  Service: Orthopedics;  Laterality: N/A;  2 hrs, Microlumbar decompression L2-3, possible L1-2   PACEMAKER IMPLANT N/A 06/20/2018   Procedure: PACEMAKER IMPLANT;  Surgeon: Waddell Danelle ORN, MD;  Location: [facility] INVASIVE CV LAB;  Service: Cardiovascular;  Laterality: N/A;   PERCUTANEOUS CORONARY ROTOBLATOR INTERVENTION (PCI-R) N/A 06/30/2012   Procedure: PERCUTANEOUS CORONARY ROTOBLATOR INTERVENTION (PCI-R);  Surgeon: Bruckner BIRCH Verlin, MD;  Location: [facility] CATH LAB;  Service: Cardiovascular;  Laterality: N/A;   TEE WITHOUT CARDIOVERSION N/A 07/19/2020   Procedure: TRANSESOPHAGEAL ECHOCARDIOGRAM (TEE);  Surgeon: Lucas Dorise POUR, MD;  Location: [facility] OR;  Service: Open Heart Surgery;  Laterality: N/A;   VIDEO BRONCHOSCOPY  08/13/2011   Procedure: VIDEO BRONCHOSCOPY WITHOUT FLUORO;  Surgeon: Ozell KATHEE America, MD;  Location: [facility] ENDOSCOPY;  Service: Cardiopulmonary;  Laterality: Bilateral;    Family History  Problem Relation Age of Onset   Diabetes Mother        and brother    Kidney failure Mother    Heart disease Father        hx of MI   Breast cancer Sister    Colon cancer Neg Hx    Stomach cancer Neg Hx    Esophageal cancer Neg Hx    Rectal cancer Neg Hx     Medications- reviewed and updated Current Outpatient Medications  Medication Sig Dispense Refill   Accu-Chek Softclix Lancets lancets Use as instructed 1x a day 100 each 3   acetaminophen  (TYLENOL ) 500 MG tablet Take 1,000 mg by mouth every 6 (six) hours as needed (pain).      albuterol  (VENTOLIN  HFA) 108 (90 Base) MCG/ACT inhaler INHALE 2 PUFFS INTO THE LUNGS EVERY 6 HOURS AS NEEDED FOR WHEEZE OR SHORTNESS OF BREATH 8.5 each 3   aspirin  EC 81 MG tablet Take 1 tablet (81 mg total) by mouth daily. Swallow whole.     Blood Glucose Monitoring Suppl (ACCU-CHEK GUIDE) w/Device KIT Use as advised 1 kit 0   diclofenac (VOLTAREN) 75 MG EC tablet Take 75 mg by mouth 2 (two) times daily.     ezetimibe  (ZETIA ) 10 MG  tablet Take 1 tablet (10 mg total) by mouth daily. 90 tablet 3   famotidine  (PEPCID ) 20 MG tablet Take 20 mg by mouth daily as needed for heartburn.     fenofibrate  (TRICOR ) 145 MG tablet TAKE 1 TABLET BY MOUTH EVERY DAY 90 tablet 3   glucose blood test strip Use as instructed 1x a day 100 each 3   hydrochlorothiazide  (HYDRODIURIL ) 25 MG tablet TAKE 1/2 TABLET (12.5 MG TOTAL) BY MOUTH DAILY AS NEEDED. 45 tablet 3   ipratropium (ATROVENT ) 0.06 % nasal spray PLACE 2  SPRAYS INTO BOTH NOSTRILS 4 (FOUR) TIMES DAILY AS DIRECTED 90 mL 1   metoprolol  tartrate (LOPRESSOR ) 25 MG tablet TAKE 1 TABLET BY MOUTH TWICE A DAY 180 tablet 1   nitroGLYCERIN  (NITROSTAT ) 0.4 MG SL tablet Place 1 tablet (0.4 mg total) under the tongue every 5 (five) minutes as needed for chest pain. 25 tablet 3   omeprazole  (PRILOSEC) 40 MG capsule Take 1 capsule (40 mg total) by mouth daily. (Patient taking differently: Take 40 mg by mouth daily as needed (acid reflux).) 90 capsule 3   Pharmacist Choice Lancets MISC Use to check blood sugar 1-2 times a day 200 each 5   rosuvastatin  (CRESTOR ) 20 MG tablet TAKE 1 TABLET BY MOUTH EVERY DAY 90 tablet 3   sildenafil  (VIAGRA ) 100 MG tablet Take 1 tablet by mouth prior to sexual activity as needed 15 tablet 11   No current facility-administered medications for this visit.    Allergies-reviewed and updated Allergies  Allergen Reactions   Amlodipine Besylate Other (See Comments)    Headache, weakness, dizziness   Propofol  Other (See Comments)    Heart rate dropped     Lisinopril  Cough and Rash   Rosuvastatin  Other (See Comments)     Corncern for muscle aches but he is tolerating now     Social History   Socioeconomic History   Marital status: Married    Spouse name: Not on file   Number of children: 1   Years of education: Not on file   Highest education level: GED or equivalent  Occupational History   Occupation: Regulatory affairs officer  Tobacco Use   Smoking status:  Former    Current packs/day: 0.00    Average packs/day: 0.5 packs/day for 40.0 years (20.0 ttl pk-yrs)    Types: Cigarettes    Start date: 08/25/1971    Quit date: 08/25/2011    Years since quitting: 12.3   Smokeless tobacco: Never  Vaping Use   Vaping status: Never Used  Substance and Sexual Activity   Alcohol use: Not Currently   Drug use: No   Sexual activity: Not Currently  Other Topics Concern   Not on file  Social History Narrative   Not on file   Social Drivers of Health   Financial Resource Strain: Low Risk  (12/27/2023)   Overall Financial Resource Strain (CARDIA)    Difficulty of Paying Living Expenses: Not very hard  Food Insecurity: Patient Declined (12/27/2023)   Hunger Vital Sign    Worried About Running Out of Food in the Last Year: Patient declined    Ran Out of Food in the Last Year: Patient declined  Transportation Needs: No Transportation Needs (12/27/2023)   PRAPARE - Administrator, Civil Service (Medical): No    Lack of Transportation (Non-Medical): No  Physical Activity: Sufficiently Active (12/27/2023)   Exercise Vital Sign    Days of Exercise per Week: 4 days    Minutes of Exercise per Session: 40 min  Stress: No Stress Concern Present (12/27/2023)   [employer] of Occupational Health - Occupational Stress Questionnaire    Feeling of Stress: Not at all  Social Connections: Moderately Isolated (12/27/2023)   Social Connection and Isolation Panel    Frequency of Communication with Friends and Family: Three times a week    Frequency of Social Gatherings with Friends and Family: Once a week    Attends Religious Services: Patient declined    Database administrator or Organizations: No  Attends Banker Meetings: Not on file    Marital Status: Married        Objective:  Physical Exam: BP 134/68 Comment: today about 12:30 patient reported  Pulse 60   Temp (!) 97.3 F (36.3 C) (Temporal)   Ht 5' 10 (1.778 m)   Wt 184 lb 9.6 oz  (83.7 kg)   SpO2 97%   BMI 26.49 kg/m   Body mass index is 26.49 kg/m. Wt Readings from Last 3 Encounters:  12/30/23 184 lb 9.6 oz (83.7 kg)  11/23/23 185 lb 14.4 oz (84.3 kg)  10/29/23 180 lb 9.6 oz (81.9 kg)   Gen: NAD, resting comfortably HEENT: TMs normal bilaterally. OP clear. No thyromegaly noted.  CV: RRR with no murmurs appreciated Pulm: NWOB, CTAB with no crackles, wheezes, or rhonchi GI: Normal bowel sounds present. Soft, Nontender, Nondistended. MSK: no edema, cyanosis, or clubbing noted Skin: warm, dry Neuro: CN2-12 grossly intact. Strength 5/5 in upper and lower extremities. Reflexes symmetric and intact bilaterally.  Psych: Normal affect and thought content     Hillard Goodwine M. Kennyth, MD 12/30/2023 2:27 PM

## 2023-12-30 NOTE — Assessment & Plan Note (Signed)
 Blood pressure at goal today on metoprolol  to tartrate 12.5 mg twice daily.  He has been having postexercise hypotensive episodes and is following with cardiology for this.  Has upcoming nuclear stress test.

## 2024-01-03 ENCOUNTER — Ambulatory Visit: Payer: Self-pay | Admitting: Family Medicine

## 2024-01-03 NOTE — Progress Notes (Signed)
 Good news!  Labs are all at goal.  Do not need to make any changes to treatment plan at this time.  We should recheck again in a year.

## 2024-01-05 ENCOUNTER — Telehealth: Payer: Self-pay | Admitting: Internal Medicine

## 2024-01-05 ENCOUNTER — Encounter: Payer: Self-pay | Admitting: Internal Medicine

## 2024-01-05 NOTE — Telephone Encounter (Signed)
 Pts wife states he has been having diarrhea on and off now for 3-4 weeks. He wants to see someone before his colonoscopy. Pt scheduled to see Camie Furbish PA 9/18@11AM . Pt aware of appt.

## 2024-01-05 NOTE — Telephone Encounter (Signed)
 Received a call from patients wife stating patient is having unusual diarrhea symptops. Is requesting nurse fu call to discuss solutions and possible sooner scheduling? Pt scheduled for 11/11, Please review and advise  Thank you

## 2024-01-06 ENCOUNTER — Ambulatory Visit: Admitting: Gastroenterology

## 2024-01-06 ENCOUNTER — Encounter: Payer: Self-pay | Admitting: Gastroenterology

## 2024-01-06 ENCOUNTER — Other Ambulatory Visit

## 2024-01-06 ENCOUNTER — Telehealth: Payer: Self-pay

## 2024-01-06 VITALS — BP 130/70 | HR 72 | Ht 67.75 in | Wt 185.2 lb

## 2024-01-06 DIAGNOSIS — Z860100 Personal history of colon polyps, unspecified: Secondary | ICD-10-CM

## 2024-01-06 DIAGNOSIS — R079 Chest pain, unspecified: Secondary | ICD-10-CM

## 2024-01-06 DIAGNOSIS — K649 Unspecified hemorrhoids: Secondary | ICD-10-CM | POA: Diagnosis not present

## 2024-01-06 DIAGNOSIS — K625 Hemorrhage of anus and rectum: Secondary | ICD-10-CM

## 2024-01-06 DIAGNOSIS — R197 Diarrhea, unspecified: Secondary | ICD-10-CM | POA: Diagnosis not present

## 2024-01-06 DIAGNOSIS — I2510 Atherosclerotic heart disease of native coronary artery without angina pectoris: Secondary | ICD-10-CM | POA: Diagnosis not present

## 2024-01-06 DIAGNOSIS — R194 Change in bowel habit: Secondary | ICD-10-CM

## 2024-01-06 DIAGNOSIS — Z951 Presence of aortocoronary bypass graft: Secondary | ICD-10-CM

## 2024-01-06 DIAGNOSIS — R1030 Lower abdominal pain, unspecified: Secondary | ICD-10-CM | POA: Diagnosis not present

## 2024-01-06 DIAGNOSIS — R195 Other fecal abnormalities: Secondary | ICD-10-CM | POA: Diagnosis not present

## 2024-01-06 MED ORDER — NA SULFATE-K SULFATE-MG SULF 17.5-3.13-1.6 GM/177ML PO SOLN: 1.0000 | Freq: Once | ORAL | 0 refills | Status: AC

## 2024-01-06 NOTE — Progress Notes (Unsigned)
 Daniel Carey [id number] 1950-07-22   Chief Complaint: Diarrhea  Referring Provider: Kennyth Worth HERO, MD Primary GI MD: Dr. Albertus  HPI: Daniel Carey is a 73 y.o. 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    Last seen in office 11/2016 to discuss repeat colonoscopy.  At that time patient was on chronic Plavix  and aspirin  with history of CAD s/p drug-eluting stent in 2014.  Also has history of AV block Mobitz 1, OSA, GERD, HTN, and COPD without oxygen requirement.  Cardiac catheterization 10/2016 showed moderate-severe three-vessel coronary disease.  Patient also cannot have propofol  for sedation due to marked bradycardia with propofol  on colonoscopy in 2013.  At that time he was scheduled for repeat colonoscopy with fentanyl /Versed .  Had colonoscopy 02/16/2017 in outpatient hospital setting with finding of multiple polyps and path showing tubular adenomas.  Repeat colonoscopy 02/28/2021 (done in [facility]), again with multiple polyps found and recommended recall in 3 years.    He is currently scheduled for repeat colonoscopy 02/29/2024.  Called 01/05/2024 reporting intermittent diarrhea ongoing for 3 to 4 weeks and requested to be seen prior to procedure.  Did see PCP 12/30/2023 at which time endorsed 10 days of diarrhea.  Advised to use probiotics, fiber supplements, Imodium as needed.  GI pathogen panel and C. difficile were ordered but have not been completed.  Labs showed normal CBC, CMP, TSH, PSA.  Elevated triglycerides.  Last seen by cardiology 11/23/2023 at which time he did endorse having occasional exertional chest pain and shortness of breath, with lightheadedness but no syncope.  Stress PET was ordered and is scheduled for 01/19/2024.  Echocardiogram 12/29/2023  showed LVEF of 60 to 65% with no significant abnormalities.  -----------------------------------TODAY-------------------------------------  Patient states he started having diarrhea earlier this month.  Initially had some rectal pain with onset of diarrhea and BRBPR with protrusion of an internal hemorrhoid, but pain and bleeding resolved after that first day.  Has continued to have intermittent diarrhea.  Can have a couple days of watery stools followed by that are loose but not watery.  Today had a very small amount of formed stool, yesterday had diarrhea.  States that night before last he took some Imodium.  Has been intermittently getting abdominal cramps.  Stool kits were not available at office with his PCP which is why he did not complete these he is interested in ruling out infection.  First night he had diarrhea he had some chills but no fever and this did not last long.  He did not check his temperature.  Also had some nausea that first night but no vomiting.  Denies any sick contacts, recent travel, dietary changes.  Reports history of E. coli infection following previous colonoscopy which was treated with antibiotics.  Was having more severe symptoms at that time and was also found to have UTI.  Does not feel he is having similar symptoms at this time.  Has been trying to eat healthier.  Intentionally eating less and denies a decreased appetite.  Not tried a fiber supplement yet.  Will feel that he is improving, but then diarrhea will return.  Does improve with Imodium.  Has had some lower abdominal cramping a couple of times, not consistently.  He is able to go about his normal activities.  Abdominal cramping is better after bowel movement.  He denies any melena, and has not noticed any rectal bleeding since that first day.  Prior to onset of diarrhea was having a normal, formed bowel movement daily without any constipation or diarrhea.  States that he started taking Zetia  daily back  in July.  Had been prescribed Setia previously but was not taking it as instructed, and has recently been advised by his cardiologist that he needs to take it every day.  He has been having some intermittent chest pain and is scheduled for stress test.  Denies any shortness of breath.  Previous GI Procedures/Imaging   Colonoscopy 02/28/2021 - One 2 mm polyp in the cecum, removed with a cold biopsy forceps. Resected and retrieved.  - Three 4 to 6 mm polyps in the ascending colon, removed with a cold snare. Resected and retrieved.  - A tattoo was seen in the proximal transverse colon. A post- polypectomy scar was found at the tattoo site. There was no evidence of residual polyp tissue.  - Three 4 to 7 mm polyps in the transverse colon, removed with a cold snare. Resected and retrieved.  - One 5 mm polyp in the sigmoid colon, removed with a cold snare. Resected and retrieved.  - One 3 mm polyp in the rectum, removed with a cold biopsy forceps. Resected and retrieved.  - Diverticulosis in the sigmoid colon.  - Small internal hemorrhoids. - Recall 3 years Path: 1. Surgical [P], colon, ascending x3 cold snare and cecum x1 forcep, polyp (4) - MULTIPLE FRAGMENTS OF BENIGN COLONIC MUCOSA - NO HIGH-GRADE DYSPLASIA OR MALIGNANCY IDENTIFIED 2. Surgical [P], colon, transverse x3, polyp (3) - TUBULAR ADENOMA (5 OF 6 FRAGMENTS) - BENIGN COLONIC MUCOSA (1 OF 6 FRAGMENTS) - NO HIGH-GRADE DYSPLASIA OR MALIGNANCY IDENTIFIED 3. Surgical [P], colon, sigmoid x1, rectal x1 (forcep), polyp (2) - TUBULAR ADENOMA (1 OF 2 FRAGMENTS) - BENIGN COLONIC MUCOSA (1 OF 2 FRAGMENTS) - NO HIGH-GRADE DYSPLASIA OR MALIGNANCY IDENTIFIED  Colonoscopy 02/16/2017 - One 3 mm polyp in the cecum, removed with a cold biopsy forceps. Resected and retrieved.  - One 6 mm polyp in the ascending colon, removed with a cold snare. Resected and retrieved.  - Two 2 to 3 mm polyps in the ascending colon, removed with a cold biopsy forceps.  Resected and retrieved.  - Two 4 to 5 mm polyps in the transverse colon, removed with a cold snare. Resected and retrieved.  - One 5 mm polyp in the sigmoid colon, removed with a cold snare. Resected and retrieved.  - The examination was otherwise normal on direct and retroflexion views. Path: 1. Colon, polyp(s), cecal, ascending, - TUBULAR ADENOMA (TWO FRAGMENTS). - BENIGN COLONIC MUCOSA (THREE FRAGMENTS). - NO HIGH GRADE DYSPLASIA OR MALIGNANCY. 2. Colon, polyp(s), transverse x 2, sigmoid - TUBULAR ADENOMA (THREE FRAGMENTS). - BENIGN COLONIC MUCOSA (THREE FRAGMENTS). - NO HIGH GRADE DYSPLASIA OR MALIGNANCY.  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    noted on EKG March 2013   CAD (coronary artery disease)    s/p atherectomy, PTCA/DES x2 mid & distal RCA 06/30/12   Carotid artery occlusion    Chest pain    Complication of anesthesia    heart rate dropping with propofol  last procedure   COPD (chronic obstructive pulmonary disease) (HCC)    Dyspnea    ED (erectile dysfunction)    Esophageal stricture  External hemorrhoids without mention of complication    GERD (gastroesophageal reflux disease)    Hiatal hernia    History of kidney stones    HLD (hyperlipidemia)    HTN (hypertension)    Myocardial infarction [facility])    1996   Personal history of colonic polyps 03/12/2010   TUBULAR ADENOMA   Pneumonia    Presence of permanent cardiac pacemaker    Sleep apnea    wears CPAP   Tobacco abuse    Tubular adenoma of colon    Type 2 diabetes mellitus (HCC)     Past Surgical History:  Procedure Laterality Date   ANGIOPLASTY     X3   ANTERIOR CERVICAL DECOMP/DISCECTOMY FUSION N/A 11/25/2022   Procedure: Anterior Cervical Decompression/Discectomy Fusion Cervical Three-Four;  Surgeon: Debby Dorn MATSU, MD;  Location: [facility] OR;  Service: Neurosurgery;  Laterality: N/A;   CARDIAC CATHETERIZATION     8 years ago stents   COLONOSCOPY N/A  02/16/2017   Procedure: COLONOSCOPY;  Surgeon: Albertus Gordy HERO, MD;  Location: [facility] ENDOSCOPY;  Service: Gastroenterology;  Laterality: N/A;   COLONOSCOPY     CORONARY ARTERY BYPASS GRAFT N/A 07/19/2020   Procedure: CORONARY ARTERY BYPASS GRAFTING (CABG) TIMES THREE USING LEFT INTERNAL MAMMARY ARTERY AND RIGHT GREATER SAPHENOUS VEIN HARVESTED ENDOSCOPICALLY;  Surgeon: Lucas Dorise POUR, MD;  Location: [facility] OR;  Service: Open Heart Surgery;  Laterality: N/A;   INSERT / REPLACE / REMOVE PACEMAKER  06/20/2018   LEFT HEART CATH AND CORONARY ANGIOGRAPHY N/A 11/05/2016   Procedure: Left Heart Cath and Coronary Angiography;  Surgeon: Mady Bruckner, MD;  Location: [facility] INVASIVE CV LAB;  Service: Cardiovascular;  Laterality: N/A;   LEFT HEART CATH AND CORONARY ANGIOGRAPHY N/A 07/12/2020   Procedure: LEFT HEART CATH AND CORONARY ANGIOGRAPHY;  Surgeon: Verlin Bruckner BIRCH, MD;  Location: [facility] INVASIVE CV LAB;  Service: Cardiovascular;  Laterality: N/A;   LUMBAR LAMINECTOMY/DECOMPRESSION MICRODISCECTOMY N/A 05/12/2018   Procedure: Microlumbar decompression L2-3, possible L1-2;  Surgeon: Duwayne Purchase, MD;  Location: [facility] OR;  Service: Orthopedics;  Laterality: N/A;  2 hrs, Microlumbar decompression L2-3, possible L1-2   PACEMAKER IMPLANT N/A 06/20/2018   Procedure: PACEMAKER IMPLANT;  Surgeon: Waddell Danelle ORN, MD;  Location: [facility] INVASIVE CV LAB;  Service: Cardiovascular;  Laterality: N/A;   PERCUTANEOUS CORONARY ROTOBLATOR INTERVENTION (PCI-R) N/A 06/30/2012   Procedure: PERCUTANEOUS CORONARY ROTOBLATOR INTERVENTION (PCI-R);  Surgeon: Bruckner BIRCH Verlin, MD;  Location: [facility] CATH LAB;  Service: Cardiovascular;  Laterality: N/A;   TEE WITHOUT CARDIOVERSION N/A 07/19/2020   Procedure: TRANSESOPHAGEAL ECHOCARDIOGRAM (TEE);  Surgeon: Lucas Dorise POUR, MD;  Location: [facility] OR;  Service: Open Heart Surgery;  Laterality: N/A;   VIDEO BRONCHOSCOPY  08/13/2011   Procedure: VIDEO BRONCHOSCOPY WITHOUT FLUORO;  Surgeon: Ozell KATHEE America, MD;  Location: [facility] ENDOSCOPY;  Service: Cardiopulmonary;  Laterality: Bilateral;    Current Outpatient Medications  Medication Sig Dispense Refill   Accu-Chek Softclix Lancets lancets Use as instructed 1x a day 100 each 3   acetaminophen  (TYLENOL ) 500 MG tablet Take 1,000 mg by mouth every 6 (six) hours as needed (pain).      albuterol  (VENTOLIN  HFA) 108 (90 Base) MCG/ACT inhaler INHALE 2 PUFFS INTO THE LUNGS EVERY 6 HOURS AS NEEDED FOR WHEEZE OR SHORTNESS OF BREATH 8.5 each 3   aspirin  EC 81 MG tablet Take 1 tablet (81 mg total) by mouth daily. Swallow whole.     Blood Glucose Monitoring Suppl (ACCU-CHEK GUIDE) w/Device KIT Use as advised 1 kit 0  diclofenac (VOLTAREN) 75 MG EC tablet Take 75 mg by mouth 2 (two) times daily.     ezetimibe  (ZETIA ) 10 MG tablet Take 1 tablet (10 mg total) by mouth daily. 90 tablet 3   famotidine  (PEPCID ) 20 MG tablet Take 20 mg by mouth daily as needed for heartburn.     glucose blood test strip Use as instructed 1x a day 100 each 3   hydrochlorothiazide  (HYDRODIURIL ) 25 MG tablet TAKE 1/2 TABLET (12.5 MG TOTAL) BY MOUTH DAILY AS NEEDED. 45 tablet 3   ipratropium (ATROVENT ) 0.06 % nasal spray PLACE 2 SPRAYS INTO BOTH NOSTRILS 4 (FOUR) TIMES DAILY AS DIRECTED 90 mL 1   metoprolol  tartrate (LOPRESSOR ) 25 MG tablet TAKE 1 TABLET BY MOUTH TWICE A DAY 180 tablet 1   nitroGLYCERIN  (NITROSTAT ) 0.4 MG SL tablet Place 1 tablet (0.4 mg total) under the tongue every 5 (five) minutes as needed for chest pain. 25 tablet 3   omeprazole  (PRILOSEC OTC) 20 MG tablet Take 20 mg by mouth daily.     Pharmacist Choice Lancets MISC Use to check blood sugar 1-2 times a day 200 each 5   rosuvastatin  (CRESTOR ) 20 MG tablet TAKE 1 TABLET BY MOUTH EVERY DAY 90 tablet 3   sildenafil  (VIAGRA ) 100 MG tablet Take 1 tablet by mouth prior to sexual activity as needed 15 tablet 11   fenofibrate  (TRICOR ) 145 MG tablet TAKE 1 TABLET BY MOUTH EVERY DAY (Patient not taking: Reported on  01/06/2024) 90 tablet 3   No current facility-administered medications for this visit.    Allergies as of 01/06/2024 - Review Complete 01/06/2024  Allergen Reaction Noted   Amlodipine besylate Other (See Comments) 12/30/2006   Propofol  Other (See Comments) 06/28/2012   Lisinopril  Cough and Rash 12/05/2010   Rosuvastatin  Other (See Comments) 02/13/2010    Family History  Problem Relation Age of Onset   Diabetes Mother        and brother    Kidney failure Mother    Heart disease Father        hx of MI   Breast cancer Sister    Colon cancer Neg Hx    Stomach cancer Neg Hx    Esophageal cancer Neg Hx    Rectal cancer Neg Hx     Social History   Tobacco Use   Smoking status: Former    Current packs/day: 0.00    Average packs/day: 0.5 packs/day for 40.0 years (20.0 ttl pk-yrs)    Types: Cigarettes    Start date: 08/25/1971    Quit date: 08/25/2011    Years since quitting: 12.3   Smokeless tobacco: Never  Vaping Use   Vaping status: Never Used  Substance Use Topics   Alcohol use: Not Currently   Drug use: No     Review of Systems:    Constitutional: No unexplained weight loss.  Had chills occurring one night recently but no fever Cardiovascular: Positive for intermittent chest pain Respiratory: No SOB or cough Gastrointestinal: See HPI and otherwise negative    Physical Exam:  Vital signs: BP 130/70 (BP Location: Left Arm, Patient Position: Sitting, Cuff Size: Normal)   Pulse 72   Ht 5' 7.75 (1.721 m) Comment: height measured without shoes  Wt 185 lb 4 oz (84 kg)   BMI 28.38 kg/m   Wt Readings from Last 3 Encounters:  01/06/24 185 lb 4 oz (84 kg)  12/30/23 184 lb 9.6 oz (83.7 kg)  11/23/23 185 lb 14.4 oz (84.3 kg)  Constitutional: Pleasant, overweight male in NAD, alert and cooperative Head:  Normocephalic and atraumatic.  Eyes: No scleral icterus. Respiratory: Respirations even and unlabored. Lungs clear to auscultation bilaterally.  No wheezes, crackles,  or rhonchi.  Cardiovascular:  Regular rate and rhythm. No murmurs. No peripheral edema. Gastrointestinal:  Soft, nondistended, nontender. No rebound or guarding. Normal bowel sounds. No appreciable masses or hepatomegaly. Rectal:  Not performed.  Neurologic:  Alert and oriented x4;  grossly normal neurologically.  Skin:   Dry and intact without significant lesions or rashes. Psychiatric: Oriented to person, place and time. Demonstrates good judgement and reason without abnormal affect or behaviors.   RELEVANT LABS AND IMAGING: CBC    Component Value Date/Time   WBC 5.8 12/30/2023 1439   RBC 4.70 12/30/2023 1439   HGB 14.2 12/30/2023 1439   HGB 15.3 11/15/2023 0815   HCT 41.9 12/30/2023 1439   HCT 47.5 11/15/2023 0815   PLT 216.0 12/30/2023 1439   PLT 236 11/15/2023 0815   MCV 89.1 12/30/2023 1439   MCV 93 11/15/2023 0815   MCH 30.1 11/15/2023 0815   MCH 30.0 11/19/2022 1500   MCHC 33.8 12/30/2023 1439   RDW 13.8 12/30/2023 1439   RDW 13.5 11/15/2023 0815   LYMPHSABS 3.1 06/15/2018 1500   MONOABS 0.7 07/14/2011 1000   EOSABS 0.1 06/15/2018 1500   BASOSABS 0.0 06/15/2018 1500    CMP     Component Value Date/Time   NA 136 12/30/2023 1439   NA 137 11/15/2023 0815   K 4.2 12/30/2023 1439   CL 100 12/30/2023 1439   CO2 28 12/30/2023 1439   GLUCOSE 88 12/30/2023 1439   BUN 21 12/30/2023 1439   BUN 17 11/15/2023 0815   CREATININE 1.20 12/30/2023 1439   CREATININE 1.06 01/09/2016 1127   CALCIUM  9.6 12/30/2023 1439   PROT 6.5 12/30/2023 1439   PROT 6.8 11/15/2023 0815   ALBUMIN  3.9 12/30/2023 1439   ALBUMIN  4.1 11/15/2023 0815   AST 14 12/30/2023 1439   ALT 19 12/30/2023 1439   ALKPHOS 55 12/30/2023 1439   BILITOT 0.6 12/30/2023 1439   BILITOT 0.4 11/15/2023 0815   GFRNONAA >60 07/23/2020 0257   GFRAA 83 12/22/2019 1555   Echocardiogram 12/29/2023 1. Left ventricular ejection fraction, by estimation, is 60 to 65% . Left ventricular ejection fraction by 3D volume is  60 % . The left ventricle has normal function. The left ventricle has no regional wall motion abnormalities. Left ventricular diastolic parameters are consistent with Grade I diastolic dysfunction ( impaired relaxation) . The average left ventricular global longitudinal strain is - 19. 6 % . The global longitudinal strain is normal.  2. Right ventricular systolic function is normal. The right ventricular size is normal.  3. The mitral valve is normal in structure. No evidence of mitral valve regurgitation. No evidence of mitral stenosis.  4. The aortic valve is normal in structure. Aortic valve regurgitation is not visualized. No aortic stenosis is present.  5. Pulmonic valve regurgitation is moderate.  6. The inferior vena cava is normal in size with greater than 50% respiratory variability, suggesting right atrial pressure of 3 mmHg.  Assessment/Plan:   Change in bowel habits Diarrhea Loose stools Lower abdominal pain Hemorrhoids Rectal bleeding History of colon polyps Patient seen today for evaluation of about 3 weeks of intermittent diarrhea.  At onset of diarrhea earlier this month, had associated chills, small amount of bright red rectal bleeding and protrusion of hemorrhoid, and rectal pain.  These  symptoms have since resolved, but he continues to have alternating loose stools and watery diarrhea.  Has used Imodium as needed which seems to help.  Has not tried adding a fiber supplement.  Stool studies were ordered by his PCP but he was unable to complete as they did not have any stool kits available at that time.  Has had no further rectal bleeding or rectal pain.  Does have some intermittent lower abdominal cramping. Last colonoscopy 02/2021 with multiple adenomatous polyps removed and recommended recall of 3 years.  Other findings included diverticulosis of the sigmoid colon and small internal hemorrhoids. Currently scheduled for repeat colonoscopy 02/29/2024.  - Stool studies: C.  difficile PCR, GI pathogen panel, fecal calprotectin - Continue Imodium as needed - Recommend starting fiber supplement such as Benefiber 1 tablespoon daily - If continued abdominal cramping consider adding an antispasmodic  - If infection ruled out and diarrhea persists at time of colonoscopy, consider biopsies for microscopic colitis  CAD s/p DES 2014, s/p CABG 2022 Intermittent chest pain Patient with significant cardiac history including previous MI, CAD s/p DES, CABG, cardiac pacemaker.  Follows with cardiology, and at last visit in August endorsed having some occasional exertional chest pain and shortness of breath with lightheadedness but no syncope.  Today states he has not been having as much shortness of breath but does continue to have intermittent chest pain.  He is scheduled for a stress PET on 01/19/2024.  Based on this we will request cardiac clearance for upcoming procedure in November.  May need to postpone, or reschedule in hospital setting depending on upcoming cardiac evaluation.  - Request cardiac clearance for procedure based on intermittent exertional chest pain and upcoming stress test - Patient notes that propofol  has previously caused bradycardia. Fentanyl /Versed  was used for colonoscopy in 2018.   Camie Furbish, PA-C [facility] 01/06/2024, 11:15 AM  Patient Care Team: Kennyth Worth HERO, MD as PCP - General (Family Medicine) Shlomo Wilbert SAUNDERS, MD as PCP - Sleep Medicine (Cardiology) Waddell Danelle ORN, MD as PCP - Electrophysiology (Cardiology) Kate Lonni CROME, MD as PCP - Cardiology (Cardiology) Trixie File, MD as Consulting Physician (Endocrinology) Alvia Olam BIRCH, RN as [facility] Care Management

## 2024-01-06 NOTE — Telephone Encounter (Signed)
 [facility] Pre-operative Risk Assessment     Request for surgical clearance:     Endoscopy Procedure  What type of surgery is being performed?     Colon  When is this surgery scheduled?     02/29/24  What type of clearance is required ?   Cardiac  Are there any medications that need to be held prior to surgery and how long? No medication. pt has a PET scan scheduled on 01/19/24 d/t chest pain.  Practice name and name of physician performing surgery?      [facility]  What is your office phone and fax number?      Phone- [phone]  Fax- [phone]  Anesthesia type (None, local, MAC, general) ?       MAC   Please route your response to Corean [place], [facility]

## 2024-01-06 NOTE — Progress Notes (Signed)
 Remote PPM Transmission

## 2024-01-06 NOTE — Patient Instructions (Addendum)
 Your provider has requested that you go to the basement level for lab work before leaving today. Press B on the elevator. The lab is located at the first door on the left as you exit the elevator.   Start Benefiber 1 tbsp daily.   Continue imodium as needed.   You have been scheduled for a colonoscopy. Please follow written instructions given to you at your visit today.   If you use inhalers (even only as needed), please bring them with you on the day of your procedure.  DO NOT TAKE 7 DAYS PRIOR TO TEST- Trulicity (dulaglutide) Ozempic, Wegovy (semaglutide) Mounjaro (tirzepatide) Bydureon Bcise (exanatide extended release)  DO NOT TAKE 1 DAY PRIOR TO YOUR TEST Rybelsus (semaglutide) Adlyxin (lixisenatide) Victoza (liraglutide) Byetta (exanatide) ___________________________________________________________________________   _______________________________________________________  If your blood pressure at your visit was 140/90 or greater, please contact your primary care physician to follow up on this.  _______________________________________________________  If you are age 59 or older, your body mass index should be between 23-30. Your Body mass index is 28.38 kg/m. If this is out of the aforementioned range listed, please consider follow up with your Primary Care Provider.  If you are age 43 or younger, your body mass index should be between 19-25. Your Body mass index is 28.38 kg/m. If this is out of the aformentioned range listed, please consider follow up with your Primary Care Provider.   ________________________________________________________  The [facility] providers would like to encourage you to use MYCHART to communicate with providers for non-urgent requests or questions.  Due to long hold times on the telephone, sending your provider a message by [facility] may be a faster and more efficient way to get a response.  Please allow 48 business hours for a response.  Please  remember that this is for non-urgent requests.  _______________________________________________________  [facility] is using a team-based approach to care.  Your team is made up of your doctor and two to three APPS. Our APPS (Nurse Practitioners and Physician Assistants) work with your physician to ensure care continuity for you. They are fully qualified to address your health concerns and develop a treatment plan. They communicate directly with your gastroenterologist to care for you. Seeing the Advanced Practice Practitioners on your physician's team can help you by facilitating care more promptly, often allowing for earlier appointments, access to diagnostic testing, procedures, and other specialty referrals.

## 2024-01-07 ENCOUNTER — Encounter: Payer: Self-pay | Admitting: Gastroenterology

## 2024-01-07 ENCOUNTER — Other Ambulatory Visit

## 2024-01-07 ENCOUNTER — Ambulatory Visit: Payer: Self-pay | Admitting: Internal Medicine

## 2024-01-07 DIAGNOSIS — R194 Change in bowel habit: Secondary | ICD-10-CM

## 2024-01-07 DIAGNOSIS — R195 Other fecal abnormalities: Secondary | ICD-10-CM

## 2024-01-07 DIAGNOSIS — R1030 Lower abdominal pain, unspecified: Secondary | ICD-10-CM | POA: Diagnosis not present

## 2024-01-07 DIAGNOSIS — R197 Diarrhea, unspecified: Secondary | ICD-10-CM | POA: Diagnosis not present

## 2024-01-07 NOTE — Telephone Encounter (Signed)
 It looks like the colonoscopy is not scheduled until November and his PET is 10/1, so we can f/u results of PET prior to colonoscopy

## 2024-01-08 LAB — CLOSTRIDIUM DIFFICILE BY PCR: Toxigenic C. Difficile by PCR: NEGATIVE

## 2024-01-09 LAB — GASTROINTESTINAL PATHOGEN PNL
CampyloBacter Group: NOT DETECTED
Norovirus GI/GII: NOT DETECTED
Rotavirus A: NOT DETECTED
Salmonella species: NOT DETECTED
Shiga Toxin 1: NOT DETECTED
Shiga Toxin 2: NOT DETECTED
Shigella Species: NOT DETECTED
Vibrio Group: NOT DETECTED
Yersinia enterocolitica: NOT DETECTED

## 2024-01-10 ENCOUNTER — Ambulatory Visit: Payer: Self-pay | Admitting: Gastroenterology

## 2024-01-10 LAB — CALPROTECTIN, FECAL: Calprotectin, Fecal: 413 ug/g — ABNORMAL HIGH (ref 0–120)

## 2024-01-11 NOTE — Telephone Encounter (Signed)
 error

## 2024-01-13 ENCOUNTER — Encounter: Payer: Self-pay | Admitting: Family Medicine

## 2024-01-13 ENCOUNTER — Ambulatory Visit: Payer: Self-pay

## 2024-01-13 ENCOUNTER — Ambulatory Visit (INDEPENDENT_AMBULATORY_CARE_PROVIDER_SITE_OTHER): Admitting: Family Medicine

## 2024-01-13 ENCOUNTER — Telehealth: Payer: Self-pay | Admitting: *Deleted

## 2024-01-13 VITALS — BP 118/68 | HR 73 | Temp 98.4°F | Ht 67.0 in | Wt 182.0 lb

## 2024-01-13 DIAGNOSIS — J029 Acute pharyngitis, unspecified: Secondary | ICD-10-CM | POA: Diagnosis not present

## 2024-01-13 DIAGNOSIS — I10 Essential (primary) hypertension: Secondary | ICD-10-CM

## 2024-01-13 LAB — POC COVID19 BINAXNOW: SARS Coronavirus 2 Ag: POSITIVE — AB

## 2024-01-13 LAB — POCT RAPID STREP A (OFFICE): Rapid Strep A Screen: POSITIVE — AB

## 2024-01-13 MED ORDER — BENZONATATE 200 MG PO CAPS: 200.0000 mg | ORAL_CAPSULE | Freq: Two times a day (BID) | ORAL | 0 refills | Status: DC | PRN

## 2024-01-13 MED ORDER — AZITHROMYCIN 250 MG PO TABS: ORAL_TABLET | ORAL | 0 refills | Status: DC

## 2024-01-13 NOTE — Patient Instructions (Signed)
 It was very nice to see you today!  Your COVID and strep test were both positive.  Will start azithromycin  and Tessalon .  Please continue to stay well-hydrated.  You can use over-the-counter meds as needed.  Let us  know if not improving.  Return if symptoms worsen or fail to improve.   Take care, Dr Kennyth  PLEASE NOTE:  If you had any lab tests, please let us  know if you have not heard back within a few days. You may see your results on mychart before we have a chance to review them but we will give you a call once they are reviewed by us .   If we ordered any referrals today, please let us  know if you have not heard from their office within the next week.   If you had any urgent prescriptions sent in today, please check with the pharmacy within an hour of our visit to make sure the prescription was transmitted appropriately.   Please try these tips to maintain a healthy lifestyle:  Eat at least 3 REAL meals and 1-2 snacks per day.  Aim for no more than 5 hours between eating.  If you eat breakfast, please do so within one hour of getting up.   Each meal should contain half fruits/vegetables, one quarter protein, and one quarter carbs (no bigger than a computer mouse)  Cut down on sweet beverages. This includes juice, soda, and sweet tea.   Drink at least 1 glass of water with each meal and aim for at least 8 glasses per day  Exercise at least 150 minutes every week.

## 2024-01-13 NOTE — Assessment & Plan Note (Signed)
 Blood pressure at goal today on metoprolol  tartrate 12.5 mg twice daily.  He has been having intermittent postexercise hypotensive episodes and has upcoming nuclear stress test for further evaluation.

## 2024-01-13 NOTE — Telephone Encounter (Signed)
 FYI Only or Action Required?: FYI only for provider.  Patient was last seen in primary care on 12/30/2023 by Daniel Worth HERO, MD.  Called Nurse Triage reporting Hypertension.  Symptoms began several days ago.  Interventions attempted: OTC medications: Robitussin.  Symptoms are: unchanged.  Triage Disposition: See Physician Within 24 Hours  Patient/caregiver understands and will follow disposition?: Yes   Copied from CRM #[id number]. Topic: Clinical - Red Word Triage >> Jan 13, 2024  8:54 AM Zy'onna H wrote: Red Word that prompted transfer to Nurse Triage: patient is having symptoms of both COVID/Bronchitis - Coughing - Congestion -Low BP 73/43 and Headache   Transf. To NT Reason for Disposition  [1] Continuous (nonstop) coughing interferes with work or school AND [2] no improvement using cough treatment per Care Advice  Answer Assessment - Initial Assessment Questions 1. BLOOD PRESSURE: What is your blood pressure? Did you take at least two measurements 5 minutes apart?     127/71 2. ONSET: When did you take your blood pressure?     Now 3. HOW: How did you take your blood pressure? (e.g., visiting nurse, automatic home BP monitor)     Automatic home BP monitor 4. HISTORY: Do you have a history of low blood pressure? What is your blood pressure normally?     No 5. MEDICINES: Are you taking any medicines for blood pressure? If Yes, ask: Have they been changed recently?     Yes, no changes 6. PULSE RATE: Do you know what your pulse rate is?      74 7. OTHER SYMPTOMS: Have you been sick recently? Have you had a recent injury?     Cough, chest congestion, shallow breathing, sore throat  Answer Assessment - Initial Assessment Questions Patient's wife called saying that his BP was low about 1 hour ago 73/43. I asked if he could recheck the BP, it is now 127/71. She says he needs to come in and get checked for COVID or something. I asked about exposure, she  denies. He's had a cough since Tuesday, coughing up yellow phlegm. He says the drainage is causing a sore throat, shallow breathing, headache, dizzy this morning but not now, nauseated this morning when got up but not now. He denies SOB, chest pain. He says the same discomfort in the chest that has been ongoing and he's following cardiology about that, no different than normal. Advised OV today with PCP, he agreed. Advised to hold off on BP medications until seen by PCP today for recommendation.   1. ONSET: When did the cough begin?      Tuesday  2. SEVERITY: How bad is t he cough today?      8  3. SPUTUM: Describe the color of your sputum (e.g., none, dry cough; clear, white, yellow, green)     Yellow   4. HEMOPTYSIS: Are you coughing up any blood? If Yes, ask: How much? (e.g., flecks, streaks, tablespoons, etc.)     No  5. DIFFICULTY BREATHING: Are you having difficulty breathing? If Yes, ask: How bad is it? (e.g., mild, moderate, severe)      No, shallow breathing since the coughing started  6. FEVER: Do you have a fever? If Yes, ask: What is your temperature, how was it measured, and when did it start?     No  7. CARDIAC HISTORY: Do you have any history of heart disease? (e.g., heart attack, congestive heart failure)      Yes, bypass surgery  8. LUNG  HISTORY: Do you have any history of lung disease?  (e.g., pulmonary embolus, asthma, emphysema)     No  9. PE RISK FACTORS: Do you have a history of blood clots? (or: recent major surgery, recent prolonged travel, bedridden)     No  10. OTHER SYMPTOMS: Do you have any other symptoms? (e.g., runny nose, wheezing, chest pain)       Headache, shallow breathing, dizzy when stood up, nauseated this morning   12. TRAVEL: Have you traveled out of the country in the last month? (e.g., travel history, exposures)       No known exposure  Protocols used: Blood Pressure - Low-A-AH, Cough - Acute Productive-A-AH

## 2024-01-13 NOTE — Progress Notes (Signed)
   Daniel Carey is a 73 y.o. male who presents today for an office visit.  Assessment/Plan:  New/Acute Problems: Sore Throat  Rapid strep and COVID positive.  He request prescription for azithromycin  as he has done well with this in the past.  We did discuss Paxlovid as well however do not think it would give him much benefit at this point.  We encouraged hydration.  Will send in Tessalon  as needed for the cough.  He can use over-the-counter meds as needed.  He will let us  know if not improving in the next 2 days.  We discussed reasons to return to care.  Diarrhea We discussed this when he was here 2 weeks ago for his CPE.  He has had some modest improvement.  His workup thus far was notable for elevated calprotectin however his GI pathogen panel was negative.  They are planning on performing a colonoscopy in a couple of months though he is currently on the wait list to have this moved up.  Will defer further management to GI.  Chronic Problems Addressed Today: Essential hypertension Blood pressure at goal today on metoprolol  tartrate 12.5 mg twice daily.  He has been having intermittent postexercise hypotensive episodes and has upcoming nuclear stress test for further evaluation.     Subjective:  HPI:  See assessment / plan for status of chronic conditions. Patient here today for cough. He is here with his wife.  His main concern today is cough and sore throat.  Symptoms started a few days ago.  Seems to be worsening.  No known sick contacts.  He has had low-grade fever.  Some intermittent nausea as well.        Objective:  Physical Exam: BP 118/68   Pulse 73   Temp 98.4 F (36.9 C) (Temporal)   Ht 5' 7 (1.702 m)   Wt 182 lb (82.6 kg)   SpO2 95%   BMI 28.51 kg/m   Gen: No acute distress, resting comfortably HEENT: OP erythematous CV: Regular rate and rhythm with no murmurs appreciated Pulm: Normal work of breathing, clear to auscultation bilaterally with no crackles,  wheezes, or rhonchi Neuro: Grossly normal, moves all extremities Psych: Normal affect and thought content      Dangela How M. Kennyth, MD 01/13/2024 12:07 PM

## 2024-01-13 NOTE — Telephone Encounter (Signed)
 Appt today

## 2024-01-13 NOTE — Addendum Note (Signed)
 Addended by: Leeba Barbe M on: 01/13/2024 03:14 PM   Modules accepted: Orders

## 2024-01-13 NOTE — Telephone Encounter (Signed)
 Copied from CRM [id number]. Topic: Clinical - Prescription Issue >> Jan 13, 2024  1:32 PM Thersia BROCKS wrote: Reason for CRM: Patient called in regarding prescription Azithromycin  and Tessalon  , was not at the pharmacy when patient went, would like that sent as soon as possible   Spoke with patient, notified Rx was send in  [facility]

## 2024-01-17 NOTE — Telephone Encounter (Signed)
 Duplicated

## 2024-01-19 ENCOUNTER — Ambulatory Visit (HOSPITAL_COMMUNITY)

## 2024-01-21 DIAGNOSIS — H18832 Recurrent erosion of cornea, left eye: Secondary | ICD-10-CM | POA: Diagnosis not present

## 2024-01-21 DIAGNOSIS — H04123 Dry eye syndrome of bilateral lacrimal glands: Secondary | ICD-10-CM | POA: Diagnosis not present

## 2024-01-21 LAB — OPHTHALMOLOGY REPORT-SCANNED

## 2024-01-28 ENCOUNTER — Encounter (HOSPITAL_COMMUNITY)

## 2024-02-07 ENCOUNTER — Encounter (HOSPITAL_COMMUNITY): Payer: Self-pay

## 2024-02-08 ENCOUNTER — Encounter (HOSPITAL_COMMUNITY)
Admission: RE | Admit: 2024-02-08 | Discharge: 2024-02-08 | Disposition: A | Discharge status: Home / Self Care | Source: Ambulatory Visit | Attending: Cardiology | Admitting: Cardiology

## 2024-02-08 DIAGNOSIS — R079 Chest pain, unspecified: Secondary | ICD-10-CM | POA: Diagnosis not present

## 2024-02-08 LAB — NM PET CT CARDIAC PERFUSION MULTI W/ABSOLUTE BLOODFLOW
LV dias vol: 67 mL (ref 62–150)
LV sys vol: 28 mL (ref 4.2–5.8)
MBFR: 2.08
Nuc Rest EF: 58 %
Nuc Stress EF: 54 %
Peak HR: 88 {beats}/min
Rest HR: 68 {beats}/min
Rest MBF: 0.78 ml/g/min
Rest Nuclear Isotope Dose: 21.2 mCi
ST Depression (mm): 0 mm
Stress MBF: 1.62 ml/g/min
Stress Nuclear Isotope Dose: 21.3 mCi

## 2024-02-08 MED ORDER — REGADENOSON 0.4 MG/5ML IV SOLN
INTRAVENOUS | Status: AC
  Filled 2024-02-08: qty 5

## 2024-02-08 MED ORDER — REGADENOSON 0.4 MG/5ML IV SOLN
0.4000 mg | Freq: Once | INTRAVENOUS | Status: AC
Start: 2024-02-08 — End: 2024-02-08
  Administered 2024-02-08: 0.4 mg via INTRAVENOUS

## 2024-02-08 MED ORDER — RUBIDIUM RB82 GENERATOR (RUBYFILL)
21.2000 | PACK | Freq: Once | INTRAVENOUS | Status: AC
  Administered 2024-02-08: 21.2 via INTRAVENOUS

## 2024-02-08 MED ORDER — RUBIDIUM RB82 GENERATOR (RUBYFILL)
21.3000 | PACK | Freq: Once | INTRAVENOUS | Status: AC
  Administered 2024-02-08: 21.3 via INTRAVENOUS

## 2024-02-08 NOTE — Progress Notes (Signed)
 Tolerated lexiscan 

## 2024-02-09 ENCOUNTER — Telehealth: Payer: Self-pay

## 2024-02-09 ENCOUNTER — Other Ambulatory Visit: Payer: Self-pay | Admitting: Family Medicine

## 2024-02-09 NOTE — Telephone Encounter (Signed)
 Received a message from Dr.Schumann to schedule patient appointment with him tomorrow 10/23 to discuss abnormal pet scan.Spoke to patient advised of abnormal pet scan.Appointment scheduled with Dr.Schumann Thurs 10/23 at 8:20 am.

## 2024-02-09 NOTE — Progress Notes (Unsigned)
 Cardiology Office Note:    Date:  02/11/2024   ID:  Daniel Carey, DOB 31-Aug-1950, MRN [id number]  PCP:  Kennyth Worth HERO, MD  Cardiologist:  Lonni LITTIE Nanas, MD  Electrophysiologist:  Danelle Birmingham, MD   Referring MD: Kennyth Worth HERO, MD   Chief Complaint  Patient presents with   Coronary Artery Disease    History of Present Illness:    Daniel Carey is a 73 y.o. male with a hx of CAD status post CABG, CHB status post PPM, COPD, hypertension, hyperlipidemia, OSA who presents for follow-up.  Previously followed with Dr. Alveta.  Cath 06/2020 showed severe multivessel disease.  Echocardiogram 06/2020 showed EF 55 to 60%.  Underwent CABG on 07/19/2020 with LIMA-LAD, SVG-OM 3, SVG-PDA.  He reported chest pain and underwent echocardiogram 12/29/2023 which showed normal biventricular function, moderate pulm monic regurgitation.  Stress PET 02/08/2024 showed severe ischemia in LCx territory, normal global myocardial blood flow reserve but decreased in LCx territory (1.62), drop in LVEF with stress, overall high risk study.  Since last clinic visit, he reports he continues to have chest pain with exertion.  Reports that walking up a flight of stairs will feel chest pain.  Typically resolves within a few minutes with rest, has not had to use nitroglycerin .  Also reports having dyspnea with exertion.  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    noted on EKG March 2013   CAD (coronary artery disease)    s/p atherectomy, PTCA/DES x2 mid & distal RCA 06/30/12   Carotid artery occlusion    Chest pain    Complication of anesthesia    heart rate dropping with propofol  last procedure   COPD (chronic obstructive pulmonary disease) (HCC)    Dyspnea    ED (erectile dysfunction)    Esophageal stricture    External hemorrhoids without mention of complication    GERD (gastroesophageal reflux disease)    Hiatal hernia    History of kidney stones     HLD (hyperlipidemia)    HTN (hypertension)    Myocardial infarction (HCC)    1996   Personal history of colonic polyps 03/12/2010   TUBULAR ADENOMA   Pneumonia    Presence of permanent cardiac pacemaker    Sleep apnea    wears CPAP   Tobacco abuse    Tubular adenoma of colon    Type 2 diabetes mellitus (HCC)     Past Surgical History:  Procedure Laterality Date   ANGIOPLASTY     X3   ANTERIOR CERVICAL DECOMP/DISCECTOMY FUSION N/A 11/25/2022   Procedure: Anterior Cervical Decompression/Discectomy Fusion Cervical Three-Four;  Surgeon: Debby Dorn MATSU, MD;  Location: [facility] OR;  Service: Neurosurgery;  Laterality: N/A;   CARDIAC CATHETERIZATION     8 years ago stents   COLONOSCOPY N/A 02/16/2017   Procedure: COLONOSCOPY;  Surgeon: Albertus Gordy HERO, MD;  Location: [facility] ENDOSCOPY;  Service: Gastroenterology;  Laterality: N/A;   COLONOSCOPY     CORONARY ARTERY BYPASS GRAFT N/A 07/19/2020   Procedure: CORONARY ARTERY BYPASS GRAFTING (CABG) TIMES THREE USING LEFT INTERNAL MAMMARY ARTERY AND RIGHT GREATER SAPHENOUS VEIN HARVESTED ENDOSCOPICALLY;  Surgeon: Lucas Dorise POUR, MD;  Location: [facility] OR;  Service: Open Heart Surgery;  Laterality: N/A;   INSERT / REPLACE / REMOVE PACEMAKER  06/20/2018   LEFT HEART CATH AND CORONARY ANGIOGRAPHY N/A 11/05/2016   Procedure: Left Heart Cath and Coronary Angiography;  Surgeon: End,  Lonni, MD;  Location: [facility] INVASIVE CV LAB;  Service: Cardiovascular;  Laterality: N/A;   LEFT HEART CATH AND CORONARY ANGIOGRAPHY N/A 07/12/2020   Procedure: LEFT HEART CATH AND CORONARY ANGIOGRAPHY;  Surgeon: Verlin Lonni BIRCH, MD;  Location: [facility] INVASIVE CV LAB;  Service: Cardiovascular;  Laterality: N/A;   LUMBAR LAMINECTOMY/DECOMPRESSION MICRODISCECTOMY N/A 05/12/2018   Procedure: Microlumbar decompression L2-3, possible L1-2;  Surgeon: Duwayne Purchase, MD;  Location: [facility] OR;  Service: Orthopedics;  Laterality: N/A;  2 hrs, Microlumbar decompression L2-3, possible L1-2    PACEMAKER IMPLANT N/A 06/20/2018   Procedure: PACEMAKER IMPLANT;  Surgeon: Waddell Danelle ORN, MD;  Location: [facility] CV LAB;  Service: Cardiovascular;  Laterality: N/A;   PERCUTANEOUS CORONARY ROTOBLATOR INTERVENTION (PCI-R) N/A 06/30/2012   Procedure: PERCUTANEOUS CORONARY ROTOBLATOR INTERVENTION (PCI-R);  Surgeon: Lonni BIRCH Verlin, MD;  Location[facility] C[facility] LAB;  Service: Cardiovascular;  Laterality: N/A;   TEE WITHOUT CARDIOVERSION N/A 07/19/2020   Procedure: TRANSESOPHAGEAL ECHOCARDIOGRAM (TEE);  Surgeon: Lucas Dorise POUR, MD;  Location[facility] OR;  Service: Open Heart Surgery;  Laterality: N/A;   VIDEO BRONCHOSCOPY  08/13/2011   Procedure: VIDEO BRONCHOSCOPY WITHOUT FLUORO;  Surgeon: Ozell KATHEE America, MD;  Location: [facility] ENDOSCOPY;  Service: Cardiopulmonary;  Laterality: Bilateral;    Current Medications: Current Meds  Medication Sig   Accu-Chek Softclix Lancets lancets Use as instructed 1x a day   acetaminophen  (TYLENOL ) 500 MG tablet Take 1,000 mg by mouth every 6 (six) hours as needed (pain).    albuterol  (VENTOLIN  HFA) 108 (90 Base) MCG/ACT inhaler INHALE 2 PUFFS INTO THE LUNGS EVERY 6 HOURS AS NEEDED FOR WHEEZE OR SHORTNESS OF BREATH   aspirin  EC 81 MG tablet Take 1 tablet (81 mg total) by mouth daily. Swallow whole.   benzonatate  (TESSALON ) 200 MG capsule Take 1 capsule (200 mg total) by mouth 2 (two) times daily as needed for cough.   Blood Glucose Monitoring Suppl (ACCU-CHEK GUIDE) w/Device KIT Use as advised   diclofenac (VOLTAREN) 75 MG EC tablet Take 75 mg by mouth 2 (two) times daily.   ezetimibe  (ZETIA ) 10 MG tablet Take 1 tablet (10 mg total) by mouth daily.   famotidine  (PEPCID ) 20 MG tablet Take 20 mg by mouth daily as needed for heartburn.   fenofibrate  (TRICOR ) 145 MG tablet TAKE 1 TABLET BY MOUTH EVERY DAY   glucose blood test strip Use as instructed 1x a day   hydrochlorothiazide  (HYDRODIURIL ) 25 MG tablet TAKE 1/2 TABLET (12.5 MG TOTAL) BY MOUTH DAILY AS NEEDED.    ipratropium (ATROVENT ) 0.06 % nasal spray PLACE 2 SPRAYS INTO BOTH NOSTRILS 4 (FOUR) TIMES DAILY AS DIRECTED   metoprolol  succinate (TOPROL  XL) 25 MG 24 hr tablet Take 1 tablet (25 mg total) by mouth daily.   Na Sulfate-K Sulfate-Mg Sulfate concentrate (SUPREP) 17.5-3.13-1.6 GM/177ML SOLN TAKE 1 KIT (354 MLS TOTAL) BY MOUTH ONCE FOR 1 DOSE.   nitroGLYCERIN  (NITROSTAT ) 0.4 MG SL tablet Place 1 tablet (0.4 mg total) under the tongue every 5 (five) minutes as needed for chest pain.   Pharmacist Choice Lancets MISC Use to check blood sugar 1-2 times a day   rosuvastatin  (CRESTOR ) 20 MG tablet TAKE 1 TABLET BY MOUTH EVERY DAY   sildenafil  (VIAGRA ) 100 MG tablet Take 1 tablet by mouth prior to sexual activity as needed   [DISCONTINUED] metoprolol  tartrate (LOPRESSOR ) 25 MG tablet TAKE 1 TABLET BY MOUTH TWICE A DAY     Allergies:   Amlodipine besylate, Propofol , Lisinopril , and Rosuvastatin    Social  History   Socioeconomic History   Marital status: Married    Spouse name: Not on file   Number of children: 1   Years of education: Not on file   Highest education level: GED or equivalent  Occupational History   Occupation: Regulatory affairs officer  Tobacco Use   Smoking status: Former    Current packs/day: 0.00    Average packs/day: 0.5 packs/day for 40.0 years (20.0 ttl pk-yrs)    Types: Cigarettes    Start date: 08/25/1971    Quit date: 08/25/2011    Years since quitting: 12.4   Smokeless tobacco: Never  Vaping Use   Vaping status: Never Used  Substance and Sexual Activity   Alcohol use: Not Currently   Drug use: No   Sexual activity: Not Currently  Other Topics Concern   Not on file  Social History Narrative   Not on file   Social Drivers of Health   Financial Resource Strain: Low Risk  (12/27/2023)   Overall Financial Resource Strain (CARDIA)    Difficulty of Paying Living Expenses: Not very hard  Food Insecurity: Patient Declined (12/27/2023)   Hunger Vital Sign    Worried About  Running Out of Food in the Last Year: Patient declined    Ran Out of Food in the Last Year: Patient declined  Transportation Needs: No Transportation Needs (12/27/2023)   PRAPARE - Administrator, Civil Service (Medical): No    Lack of Transportation (Non-Medical): No  Physical Activity: Sufficiently Active (12/27/2023)   Exercise Vital Sign    Days of Exercise per Week: 4 days    Minutes of Exercise per Session: 40 min  Stress: No Stress Concern Present (12/27/2023)   [employer] of Occupational Health - Occupational Stress Questionnaire    Feeling of Stress: Not at all  Social Connections: Moderately Isolated (12/27/2023)   Social Connection and Isolation Panel    Frequency of Communication with Friends and Family: Three times a week    Frequency of Social Gatherings with Friends and Family: Once a week    Attends Religious Services: Patient declined    Database administrator or Organizations: No    Attends Engineer, structural: Not on file    Marital Status: Married     Family History: The patient's family history includes Breast cancer in his sister; Diabetes in his mother; Heart disease in his father; Kidney failure in his mother. There is no history of Colon cancer, Stomach cancer, Esophageal cancer, or Rectal cancer.  ROS:   Please see the history of present illness.     All other systems reviewed and are negative.  EKGs/Labs/Other Studies Reviewed:    The following studies were reviewed today:   EKG:   11/23/2023: Atrial sensed, ventricular paced rhythm, rate 66  Recent Labs: 12/30/2023: ALT 19; TSH 3.69 02/10/2024: BUN 16; Creatinine, Ser 1.22; Hemoglobin 15.0; Platelets 193; Potassium 4.9; Sodium 137  Recent Lipid Panel    Component Value Date/Time   CHOL 116 12/30/2023 1439   CHOL 163 11/15/2023 0815   TRIG 150.0 (H) 12/30/2023 1439   HDL 40.20 12/30/2023 1439   HDL 54 11/15/2023 0815   CHOLHDL 3 12/30/2023 1439   VLDL 30.0 12/30/2023 1439    LDLCALC 46 12/30/2023 1439   LDLCALC 88 11/15/2023 0815   LDLDIRECT 107.0 02/27/2013 0904    Physical Exam:    VS:  BP (!) 148/64 (BP Location: Left Arm, Patient Position: Sitting, Cuff Size: Normal)  Pulse 69   Ht 5' 9.6 (1.768 m)   Wt 183 lb (83 kg)   SpO2 97%   BMI 26.56 kg/m     Wt Readings from Last 3 Encounters:  02/10/24 183 lb (83 kg)  01/13/24 182 lb (82.6 kg)  01/06/24 185 lb 4 oz (84 kg)     GEN:  Well nourished, well developed in no acute distress HEENT: Normal NECK: No JVD; No carotid bruits LYMPHATICS: No lymphadenopathy CARDIAC: RRR, no murmurs, rubs, gallops RESPIRATORY:  Clear to auscultation without rales, wheezing or rhonchi  ABDOMEN: Soft, non-tender, non-distended MUSCULOSKELETAL:  No edema; No deformity  SKIN: Warm and dry NEUROLOGIC:  Alert and oriented x 3 PSYCHIATRIC:  Normal affect   ASSESSMENT:    1. Coronary artery disease of native artery of native heart with stable angina pectoris   2. Essential hypertension   3. Mixed hyperlipidemia   4. Pulmonary valve insufficiency, unspecified etiology     PLAN:    CAD: Cath 06/2020 showed severe multivessel disease.  Echocardiogram 06/2020 showed EF 55 to 60%.  Underwent CABG on 07/19/2020 with LIMA-LAD, SVG-OM 3, SVG-PDA.  He reported symptoms concerning for typical angina and underwent stress PET 02/08/2024 which showed severe ischemia in LCx territory, normal global myocardial blood flow reserve but decreased in LCx territory (1.62), drop in LVEF with stress, overall high risk study. - Continue aspirin  81 mg daily - Continue rosuvastatin  20 mg daily and Zetia  10 mg daily - Change metoprolol  to Toprol -XL 25 mg daily - As needed NTG - Recommend LHC. Risks and benefits of cardiac catheterization have been discussed with the patient.  These include bleeding, infection, kidney damage, stroke, heart attack, death.  The patient understands these risks and is willing to proceed.  CHB status post PPM.   Recent device interrogation showed 99% RV pacing.  Normal LV/RV function on echo 12/2023  Hypertension: Continue hydrochlorothiazide  12.5 mg daily and will change to toprol  XL as above  Pulmonary regurgitation: Moderate on echocardiogram 12/2023.  Will monitor  Hyperlipidemia: LDL 88 on 11/15/2023, on rosuvastatin  20 mg daily.  He had stopped taking Zetia  due to fatigue.  Unclear if this was coming from Zetia .  Restarted Zetia  at clinic visit 11/2023.  LDL 46 on 12/30/23  T2DM: A1c 7.5%, follows with endocrinology  OSA: On CPAP, reports compliance  RTC in 1 month     Medication Adjustments/Labs and Tests Ordered: Current medicines are reviewed at length with the patient today.  Concerns regarding medicines are outlined above.  Orders Placed This Encounter  Procedures   Basic Metabolic Panel (BMET)   CBC   Meds ordered this encounter  Medications   metoprolol  succinate (TOPROL  XL) 25 MG 24 hr tablet    Sig: Take 1 tablet (25 mg total) by mouth daily.    Dispense:  90 tablet    Refill:  3    Patient Instructions  Medication Instructions:  Please start Toprol  XL 25 mg daily Stop METOPROLOL  as discussed with your provider *If you need a refill on your cardiac medications before your next appointment, please call your pharmacy*  Lab Work: Bmet, cbc If you have labs (blood work) drawn today and your tests are completely normal, you will receive your results only by: MyChart Message (if you have MyChart) OR A paper copy in the mail If you have any lab test that is abnormal or we need to change your treatment, we will call you to review the results.  Testing/Procedures:  [employer]  HEARTCARE A DEPT OF [place] HEARTCARE AT [address] A DEPT OF THE [facility] [address] [place] [address] Dept: [phone] Loc: [phone]  ISADOR CASTILLE  02/10/2024  You are scheduled for a Cardiac Catheterization on Wednesday, October 29 with  Dr. [name].  1. Please arrive at the [facility] (Main Entrance A) at [facility]: [address] [place], [place] [address] at 6:30 AM (This time is 2 hour(s) before your procedure to ensure your preparation).   Free valet parking service is available. You will check in at ADMITTING. The support person will be asked to wait in the waiting room.  It is OK to have someone drop you off and come back when you are ready to be discharged.    Special note: Every effort is made to have your procedure done on time. Please understand that emergencies sometimes delay scheduled procedures.  2. Diet: Nothing to eat after midnight.   3. Hydration: You need to be well hydrated before your procedure. On October 29, you may drink approved liquids (see below) until 2 hours before the procedure, with 16 oz of water as your last intake.   List of approved liquids water, clear juice, clear tea, black coffee, fruit juices, non-citric and without pulp, carbonated beverages, Gatorade, Kool -Aid, plain Jello-O and plain ice popsicles.  4. Labs: bmet, cbc today  5. Medication instructions in preparation for your procedure:   Contrast Allergy: No    On the morning of your procedure, take your Aspirin  81 mg and any morning medicines NOT listed above.  You may use sips of water.  6. Plan to go home the same day, you will only stay overnight if medically necessary. 7. Bring a current list of your medications and current insurance cards. 8. You MUST have a responsible person to drive you home. 9. Someone MUST be with you the first 24 hours after you arrive home or your discharge will be delayed. 10. Please wear clothes that are easy to get on and off and wear slip-on shoes.  Thank you for allowing us  to care for you!   -- [facility]   Follow-Up: At [facility], you and your health needs are our priority.  As part of our continuing mission to provide you  with exceptional heart care, our providers are all part of one team.  This team includes your primary Cardiologist (physician) and Advanced Practice Providers or APPs (Physician Assistants and Nurse Practitioners) who all work together to provide you with the care you need, when you need it.  Your next appointment:   Nov 25 @2 :20pm  Provider:   Dr. Kate  We recommend signing up for the patient portal called MyChart.  Sign up information is provided on this After Visit Summary.  MyChart is used to connect with patients for Virtual Visits (Telemedicine).  Patients are able to view lab/test results, encounter notes, upcoming appointments, etc.  Non-urgent messages can be sent to your provider as well.   To learn more about what you can do with MyChart, go to [email or web address].   Other Instructions none           Signed, Lonni LITTIE Kate, MD  02/11/2024 5:54 PM    [facility]

## 2024-02-09 NOTE — H&P (View-Only) (Signed)
 Cardiology Office Note:    Date:  02/11/2024   ID:  Daniel Carey, DOB 31-Aug-1950, MRN [id number]  PCP:  Kennyth Worth HERO, MD  Cardiologist:  Lonni LITTIE Nanas, MD  Electrophysiologist:  Danelle Birmingham, MD   Referring MD: Kennyth Worth HERO, MD   Chief Complaint  Patient presents with   Coronary Artery Disease    History of Present Illness:    Daniel Carey is a 73 y.o. male with a hx of CAD status post CABG, CHB status post PPM, COPD, hypertension, hyperlipidemia, OSA who presents for follow-up.  Previously followed with Dr. Alveta.  Cath 06/2020 showed severe multivessel disease.  Echocardiogram 06/2020 showed EF 55 to 60%.  Underwent CABG on 07/19/2020 with LIMA-LAD, SVG-OM 3, SVG-PDA.  He reported chest pain and underwent echocardiogram 12/29/2023 which showed normal biventricular function, moderate pulm monic regurgitation.  Stress PET 02/08/2024 showed severe ischemia in LCx territory, normal global myocardial blood flow reserve but decreased in LCx territory (1.62), drop in LVEF with stress, overall high risk study.  Since last clinic visit, he reports he continues to have chest pain with exertion.  Reports that walking up a flight of stairs will feel chest pain.  Typically resolves within a few minutes with rest, has not had to use nitroglycerin .  Also reports having dyspnea with exertion.  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    noted on EKG March 2013   CAD (coronary artery disease)    s/p atherectomy, PTCA/DES x2 mid & distal RCA 06/30/12   Carotid artery occlusion    Chest pain    Complication of anesthesia    heart rate dropping with propofol  last procedure   COPD (chronic obstructive pulmonary disease) (HCC)    Dyspnea    ED (erectile dysfunction)    Esophageal stricture    External hemorrhoids without mention of complication    GERD (gastroesophageal reflux disease)    Hiatal hernia    History of kidney stones     HLD (hyperlipidemia)    HTN (hypertension)    Myocardial infarction (HCC)    1996   Personal history of colonic polyps 03/12/2010   TUBULAR ADENOMA   Pneumonia    Presence of permanent cardiac pacemaker    Sleep apnea    wears CPAP   Tobacco abuse    Tubular adenoma of colon    Type 2 diabetes mellitus (HCC)     Past Surgical History:  Procedure Laterality Date   ANGIOPLASTY     X3   ANTERIOR CERVICAL DECOMP/DISCECTOMY FUSION N/A 11/25/2022   Procedure: Anterior Cervical Decompression/Discectomy Fusion Cervical Three-Four;  Surgeon: Debby Dorn MATSU, MD;  Location: [facility] OR;  Service: Neurosurgery;  Laterality: N/A;   CARDIAC CATHETERIZATION     8 years ago stents   COLONOSCOPY N/A 02/16/2017   Procedure: COLONOSCOPY;  Surgeon: Albertus Gordy HERO, MD;  Location: [facility] ENDOSCOPY;  Service: Gastroenterology;  Laterality: N/A;   COLONOSCOPY     CORONARY ARTERY BYPASS GRAFT N/A 07/19/2020   Procedure: CORONARY ARTERY BYPASS GRAFTING (CABG) TIMES THREE USING LEFT INTERNAL MAMMARY ARTERY AND RIGHT GREATER SAPHENOUS VEIN HARVESTED ENDOSCOPICALLY;  Surgeon: Lucas Dorise POUR, MD;  Location: [facility] OR;  Service: Open Heart Surgery;  Laterality: N/A;   INSERT / REPLACE / REMOVE PACEMAKER  06/20/2018   LEFT HEART CATH AND CORONARY ANGIOGRAPHY N/A 11/05/2016   Procedure: Left Heart Cath and Coronary Angiography;  Surgeon: End,  Lonni, MD;  Location: [facility] INVASIVE CV LAB;  Service: Cardiovascular;  Laterality: N/A;   LEFT HEART CATH AND CORONARY ANGIOGRAPHY N/A 07/12/2020   Procedure: LEFT HEART CATH AND CORONARY ANGIOGRAPHY;  Surgeon: Verlin Lonni BIRCH, MD;  Location: [facility] INVASIVE CV LAB;  Service: Cardiovascular;  Laterality: N/A;   LUMBAR LAMINECTOMY/DECOMPRESSION MICRODISCECTOMY N/A 05/12/2018   Procedure: Microlumbar decompression L2-3, possible L1-2;  Surgeon: Duwayne Purchase, MD;  Location: [facility] OR;  Service: Orthopedics;  Laterality: N/A;  2 hrs, Microlumbar decompression L2-3, possible L1-2    PACEMAKER IMPLANT N/A 06/20/2018   Procedure: PACEMAKER IMPLANT;  Surgeon: Waddell Danelle ORN, MD;  Location: [facility] INVASIVE CV LAB;  Service: Cardiovascular;  Laterality: N/A;   PERCUTANEOUS CORONARY ROTOBLATOR INTERVENTION (PCI-R) N/A 06/30/2012   Procedure: PERCUTANEOUS CORONARY ROTOBLATOR INTERVENTION (PCI-R);  Surgeon: Lonni BIRCH Verlin, MD;  Location: [facility] CATH LAB;  Service: Cardiovascular;  Laterality: N/A;   TEE WITHOUT CARDIOVERSION N/A 07/19/2020   Procedure: TRANSESOPHAGEAL ECHOCARDIOGRAM (TEE);  Surgeon: Lucas Dorise POUR, MD;  Location[facility] OR;  Service: Open Heart Surgery;  Laterality: N/A;   VIDEO BRONCHOSCOPY  08/13/2011   Procedure: VIDEO BRONCHOSCOPY WITHOUT FLUORO;  Surgeon: Ozell KATHEE America, MD;  Location: [facility] ENDOSCOPY;  Service: Cardiopulmonary;  Laterality: Bilateral;    Current Medications: Current Meds  Medication Sig   Accu-Chek Softclix Lancets lancets Use as instructed 1x a day   acetaminophen  (TYLENOL ) 500 MG tablet Take 1,000 mg by mouth every 6 (six) hours as needed (pain).    albuterol  (VENTOLIN  HFA) 108 (90 Base) MCG/ACT inhaler INHALE 2 PUFFS INTO THE LUNGS EVERY 6 HOURS AS NEEDED FOR WHEEZE OR SHORTNESS OF BREATH   aspirin  EC 81 MG tablet Take 1 tablet (81 mg total) by mouth daily. Swallow whole.   benzonatate  (TESSALON ) 200 MG capsule Take 1 capsule (200 mg total) by mouth 2 (two) times daily as needed for cough.   Blood Glucose Monitoring Suppl (ACCU-CHEK GUIDE) w/Device KIT Use as advised   diclofenac (VOLTAREN) 75 MG EC tablet Take 75 mg by mouth 2 (two) times daily.   ezetimibe  (ZETIA ) 10 MG tablet Take 1 tablet (10 mg total) by mouth daily.   famotidine  (PEPCID ) 20 MG tablet Take 20 mg by mouth daily as needed for heartburn.   fenofibrate  (TRICOR ) 145 MG tablet TAKE 1 TABLET BY MOUTH EVERY DAY   glucose blood test strip Use as instructed 1x a day   hydrochlorothiazide  (HYDRODIURIL ) 25 MG tablet TAKE 1/2 TABLET (12.5 MG TOTAL) BY MOUTH DAILY AS NEEDED.    ipratropium (ATROVENT ) 0.06 % nasal spray PLACE 2 SPRAYS INTO BOTH NOSTRILS 4 (FOUR) TIMES DAILY AS DIRECTED   metoprolol  succinate (TOPROL  XL) 25 MG 24 hr tablet Take 1 tablet (25 mg total) by mouth daily.   Na Sulfate-K Sulfate-Mg Sulfate concentrate (SUPREP) 17.5-3.13-1.6 GM/177ML SOLN TAKE 1 KIT (354 MLS TOTAL) BY MOUTH ONCE FOR 1 DOSE.   nitroGLYCERIN  (NITROSTAT ) 0.4 MG SL tablet Place 1 tablet (0.4 mg total) under the tongue every 5 (five) minutes as needed for chest pain.   Pharmacist Choice Lancets MISC Use to check blood sugar 1-2 times a day   rosuvastatin  (CRESTOR ) 20 MG tablet TAKE 1 TABLET BY MOUTH EVERY DAY   sildenafil  (VIAGRA ) 100 MG tablet Take 1 tablet by mouth prior to sexual activity as needed   [DISCONTINUED] metoprolol  tartrate (LOPRESSOR ) 25 MG tablet TAKE 1 TABLET BY MOUTH TWICE A DAY     Allergies:   Amlodipine besylate, Propofol , Lisinopril , and Rosuvastatin    Social  History   Socioeconomic History   Marital status: Married    Spouse name: Not on file   Number of children: 1   Years of education: Not on file   Highest education level: GED or equivalent  Occupational History   Occupation: Regulatory affairs officer  Tobacco Use   Smoking status: Former    Current packs/day: 0.00    Average packs/day: 0.5 packs/day for 40.0 years (20.0 ttl pk-yrs)    Types: Cigarettes    Start date: 08/25/1971    Quit date: 08/25/2011    Years since quitting: 12.4   Smokeless tobacco: Never  Vaping Use   Vaping status: Never Used  Substance and Sexual Activity   Alcohol use: Not Currently   Drug use: No   Sexual activity: Not Currently  Other Topics Concern   Not on file  Social History Narrative   Not on file   Social Drivers of Health   Financial Resource Strain: Low Risk  (12/27/2023)   Overall Financial Resource Strain (CARDIA)    Difficulty of Paying Living Expenses: Not very hard  Food Insecurity: Patient Declined (12/27/2023)   Hunger Vital Sign    Worried About  Running Out of Food in the Last Year: Patient declined    Ran Out of Food in the Last Year: Patient declined  Transportation Needs: No Transportation Needs (12/27/2023)   PRAPARE - Administrator, Civil Service (Medical): No    Lack of Transportation (Non-Medical): No  Physical Activity: Sufficiently Active (12/27/2023)   Exercise Vital Sign    Days of Exercise per Week: 4 days    Minutes of Exercise per Session: 40 min  Stress: No Stress Concern Present (12/27/2023)   [employer] of Occupational Health - Occupational Stress Questionnaire    Feeling of Stress: Not at all  Social Connections: Moderately Isolated (12/27/2023)   Social Connection and Isolation Panel    Frequency of Communication with Friends and Family: Three times a week    Frequency of Social Gatherings with Friends and Family: Once a week    Attends Religious Services: Patient declined    Database administrator or Organizations: No    Attends Engineer, structural: Not on file    Marital Status: Married     Family History: The patient's family history includes Breast cancer in his sister; Diabetes in his mother; Heart disease in his father; Kidney failure in his mother. There is no history of Colon cancer, Stomach cancer, Esophageal cancer, or Rectal cancer.  ROS:   Please see the history of present illness.     All other systems reviewed and are negative.  EKGs/Labs/Other Studies Reviewed:    The following studies were reviewed today:   EKG:   11/23/2023: Atrial sensed, ventricular paced rhythm, rate 66  Recent Labs: 12/30/2023: ALT 19; TSH 3.69 02/10/2024: BUN 16; Creatinine, Ser 1.22; Hemoglobin 15.0; Platelets 193; Potassium 4.9; Sodium 137  Recent Lipid Panel    Component Value Date/Time   CHOL 116 12/30/2023 1439   CHOL 163 11/15/2023 0815   TRIG 150.0 (H) 12/30/2023 1439   HDL 40.20 12/30/2023 1439   HDL 54 11/15/2023 0815   CHOLHDL 3 12/30/2023 1439   VLDL 30.0 12/30/2023 1439    LDLCALC 46 12/30/2023 1439   LDLCALC 88 11/15/2023 0815   LDLDIRECT 107.0 02/27/2013 0904    Physical Exam:    VS:  BP (!) 148/64 (BP Location: Left Arm, Patient Position: Sitting, Cuff Size: Normal)  Pulse 69   Ht 5' 9.6 (1.768 m)   Wt 183 lb (83 kg)   SpO2 97%   BMI 26.56 kg/m     Wt Readings from Last 3 Encounters:  02/10/24 183 lb (83 kg)  01/13/24 182 lb (82.6 kg)  01/06/24 185 lb 4 oz (84 kg)     GEN:  Well nourished, well developed in no acute distress HEENT: Normal NECK: No JVD; No carotid bruits LYMPHATICS: No lymphadenopathy CARDIAC: RRR, no murmurs, rubs, gallops RESPIRATORY:  Clear to auscultation without rales, wheezing or rhonchi  ABDOMEN: Soft, non-tender, non-distended MUSCULOSKELETAL:  No edema; No deformity  SKIN: Warm and dry NEUROLOGIC:  Alert and oriented x 3 PSYCHIATRIC:  Normal affect   ASSESSMENT:    1. Coronary artery disease of native artery of native heart with stable angina pectoris   2. Essential hypertension   3. Mixed hyperlipidemia   4. Pulmonary valve insufficiency, unspecified etiology     PLAN:    CAD: Cath 06/2020 showed severe multivessel disease.  Echocardiogram 06/2020 showed EF 55 to 60%.  Underwent CABG on 07/19/2020 with LIMA-LAD, SVG-OM 3, SVG-PDA.  He reported symptoms concerning for typical angina and underwent stress PET 02/08/2024 which showed severe ischemia in LCx territory, normal global myocardial blood flow reserve but decreased in LCx territory (1.62), drop in LVEF with stress, overall high risk study. - Continue aspirin  81 mg daily - Continue rosuvastatin  20 mg daily and Zetia  10 mg daily - Change metoprolol  to Toprol -XL 25 mg daily - As needed NTG - Recommend LHC. Risks and benefits of cardiac catheterization have been discussed with the patient.  These include bleeding, infection, kidney damage, stroke, heart attack, death.  The patient understands these risks and is willing to proceed.  CHB status post PPM.   Recent device interrogation showed 99% RV pacing.  Normal LV/RV function on echo 12/2023  Hypertension: Continue hydrochlorothiazide  12.5 mg daily and will change to toprol  XL as above  Pulmonary regurgitation: Moderate on echocardiogram 12/2023.  Will monitor  Hyperlipidemia: LDL 88 on 11/15/2023, on rosuvastatin  20 mg daily.  He had stopped taking Zetia  due to fatigue.  Unclear if this was coming from Zetia .  Restarted Zetia  at clinic visit 11/2023.  LDL 46 on 12/30/23  T2DM: A1c 7.5%, follows with endocrinology  OSA: On CPAP, reports compliance  RTC in 1 month     Medication Adjustments/Labs and Tests Ordered: Current medicines are reviewed at length with the patient today.  Concerns regarding medicines are outlined above.  Orders Placed This Encounter  Procedures   Basic Metabolic Panel (BMET)   CBC   Meds ordered this encounter  Medications   metoprolol  succinate (TOPROL  XL) 25 MG 24 hr tablet    Sig: Take 1 tablet (25 mg total) by mouth daily.    Dispense:  90 tablet    Refill:  3    Patient Instructions  Medication Instructions:  Please start Toprol  XL 25 mg daily Stop METOPROLOL  as discussed with your provider *If you need a refill on your cardiac medications before your next appointment, please call your pharmacy*  Lab Work: Bmet, cbc If you have labs (blood work) drawn today and your tests are completely normal, you will receive your results only by: MyChart Message (if you have MyChart) OR A paper copy in the mail If you have any lab test that is abnormal or we need to change your treatment, we will call you to review the results.  Testing/Procedures:  [facility]  HEARTCARE A DEPT OF [place] HEARTCARE AT [address] A DEPT OF THE [place] [address] [place] [address] Dept: [phone] Loc: [phone]  Daniel Carey  02/10/2024  You are scheduled for a Cardiac Catheterization on Wednesday, October 29 with  Dr. [name].  1. Please arrive at the [facility] (Main Entrance A) at [facility]: [address] [place], [place] [address] at 6:30 AM (This time is 2 hour(s) before your procedure to ensure your preparation).   Free valet parking service is available. You will check in at ADMITTING. The support person will be asked to wait in the waiting room.  It is OK to have someone drop you off and come back when you are ready to be discharged.    Special note: Every effort is made to have your procedure done on time. Please understand that emergencies sometimes delay scheduled procedures.  2. Diet: Nothing to eat after midnight.   3. Hydration: You need to be well hydrated before your procedure. On October 29, you may drink approved liquids (see below) until 2 hours before the procedure, with 16 oz of water as your last intake.   List of approved liquids water, clear juice, clear tea, black coffee, fruit juices, non-citric and without pulp, carbonated beverages, Gatorade, Kool -Aid, plain Jello-O and plain ice popsicles.  4. Labs: bmet, cbc today  5. Medication instructions in preparation for your procedure:   Contrast Allergy: No    On the morning of your procedure, take your Aspirin  81 mg and any morning medicines NOT listed above.  You may use sips of water.  6. Plan to go home the same day, you will only stay overnight if medically necessary. 7. Bring a current list of your medications and current insurance cards. 8. You MUST have a responsible person to drive you home. 9. Someone MUST be with you the first 24 hours after you arrive home or your discharge will be delayed. 10. Please wear clothes that are easy to get on and off and wear slip-on shoes.  Thank you for allowing us  to care for you!   -- [place] [facility]   Follow-Up: At [facility], you and your health needs are our priority.  As part of our continuing mission to provide you  with exceptional heart care, our providers are all part of one team.  This team includes your primary Cardiologist (physician) and Advanced Practice Providers or APPs (Physician Assistants and Nurse Practitioners) who all work together to provide you with the care you need, when you need it.  Your next appointment:   Nov 25 @2 :20pm  Provider:   Dr. Kate  We recommend signing up for the patient portal called MyChart.  Sign up information is provided on this After Visit Summary.  MyChart is used to connect with patients for Virtual Visits (Telemedicine).  Patients are able to view lab/test results, encounter notes, upcoming appointments, etc.  Non-urgent messages can be sent to your provider as well.   To learn more about what you can do with MyChart, go to [email or web address].   Other Instructions none           Signed, Lonni LITTIE Kate, MD  02/11/2024 5:54 PM    [facility]

## 2024-02-10 ENCOUNTER — Encounter: Payer: Self-pay | Admitting: Cardiology

## 2024-02-10 ENCOUNTER — Ambulatory Visit: Attending: Cardiology | Admitting: Cardiology

## 2024-02-10 VITALS — BP 148/64 | HR 69 | Ht 69.6 in | Wt 183.0 lb

## 2024-02-10 DIAGNOSIS — I25118 Atherosclerotic heart disease of native coronary artery with other forms of angina pectoris: Secondary | ICD-10-CM

## 2024-02-10 DIAGNOSIS — E782 Mixed hyperlipidemia: Secondary | ICD-10-CM

## 2024-02-10 DIAGNOSIS — I10 Essential (primary) hypertension: Secondary | ICD-10-CM

## 2024-02-10 DIAGNOSIS — I371 Nonrheumatic pulmonary valve insufficiency: Secondary | ICD-10-CM

## 2024-02-10 MED ORDER — METOPROLOL SUCCINATE ER 25 MG PO TB24: 25.0000 mg | ORAL_TABLET | Freq: Every day | ORAL | 3 refills | Status: DC

## 2024-02-10 NOTE — Patient Instructions (Signed)
 Medication Instructions:  Please start Toprol  XL 25 mg daily Stop METOPROLOL  as discussed with your provider *If you need a refill on your cardiac medications before your next appointment, please call your pharmacy*  Lab Work: Bmet, cbc If you have labs (blood work) drawn today and your tests are completely normal, you will receive your results only by: MyChart Message (if you have MyChart) OR A paper copy in the mail If you have any lab test that is abnormal or we need to change your treatment, we will call you to review the results.  Testing/Procedures:  [facility] HEARTCARE A DEPT OF [place] HEARTCARE AT [address] A DEPT OF THE [place] [address] [place] [address] Dept: [phone] Loc: [phone]  Daniel MELLS  02/10/2024  You are scheduled for a Cardiac Catheterization on Wednesday, October 29 with Dr. [name].  1. Please arrive at the [facility] (Main Entrance A) at [facility]: [address] [place], [place] [address] at 6:30 AM (This time is 2 hour(s) before your procedure to ensure your preparation).   Free valet parking service is available. You will check in at ADMITTING. The support person will be asked to wait in the waiting room.  It is OK to have someone drop you off and come back when you are ready to be discharged.    Special note: Every effort is made to have your procedure done on time. Please understand that emergencies sometimes delay scheduled procedures.  2. Diet: Nothing to eat after midnight.   3. Hydration: You need to be well hydrated before your procedure. On October 29, you may drink approved liquids (see below) until 2 hours before the procedure, with 16 oz of water as your last intake.   List of approved liquids water, clear juice, clear tea, black coffee, fruit juices, non-citric and without pulp, carbonated beverages, Gatorade, Kool -Aid, plain Jello-O and plain ice  popsicles.  4. Labs: bmet, cbc today  5. Medication instructions in preparation for your procedure:   Contrast Allergy: No    On the morning of your procedure, take your Aspirin  81 mg and any morning medicines NOT listed above.  You may use sips of water.  6. Plan to go home the same day, you will only stay overnight if medically necessary. 7. Bring a current list of your medications and current insurance cards. 8. You MUST have a responsible person to drive you home. 9. Someone MUST be with you the first 24 hours after you arrive home or your discharge will be delayed. 10. Please wear clothes that are easy to get on and off and wear slip-on shoes.  Thank you for allowing us  to care for you!   -- [place] [facility]   Follow-Up: At [facility], you and your health needs are our priority.  As part of our continuing mission to provide you with exceptional heart care, our providers are all part of one team.  This team includes your primary Cardiologist (physician) and Advanced Practice Providers or APPs (Physician Assistants and Nurse Practitioners) who all work together to provide you with the care you need, when you need it.  Your next appointment:   Nov 25 @2 :20pm  Provider:   Dr. Kate  We recommend signing up for the patient portal called MyChart.  Sign up information is provided on this After Visit Summary.  MyChart is used to connect with patients  for Virtual Visits (Telemedicine).  Patients are able to view lab/test results, encounter notes, upcoming appointments, etc.  Non-urgent messages can be sent to your provider as well.   To learn more about what you can do with MyChart, go to [email or web address].   Other Instructions none

## 2024-02-11 ENCOUNTER — Ambulatory Visit: Payer: Self-pay | Admitting: Cardiology

## 2024-02-11 LAB — BASIC METABOLIC PANEL WITH GFR
BUN/Creatinine Ratio: 13 (ref 10–24)
BUN: 16 mg/dL (ref 8–27)
CO2: 24 mmol/L (ref 20–29)
Calcium: 9.2 mg/dL (ref 8.6–10.2)
Chloride: 99 mmol/L (ref 96–106)
Creatinine, Ser: 1.22 mg/dL (ref 0.76–1.27)
Glucose: 156 mg/dL — ABNORMAL HIGH (ref 70–99)
Potassium: 4.9 mmol/L (ref 3.5–5.2)
Sodium: 137 mmol/L (ref 134–144)
eGFR: 63 mL/min/1.73 (ref >59)

## 2024-02-11 LAB — CBC
Hematocrit: 45.6 % (ref 37.5–51.0)
Hemoglobin: 15 g/dL (ref 13.0–17.7)
MCH: 30.6 pg (ref 26.6–33.0)
MCHC: 32.9 g/dL (ref 31.5–35.7)
MCV: 93 fL (ref 79–97)
Platelets: 193 x10E3/uL (ref 150–450)
RBC: 4.9 x10E6/uL (ref 4.14–5.80)
RDW: 13.5 % (ref 11.6–15.4)
WBC: 5.7 x10E3/uL (ref 3.4–10.8)

## 2024-02-14 ENCOUNTER — Telehealth: Payer: Self-pay | Admitting: *Deleted

## 2024-02-14 DIAGNOSIS — M5412 Radiculopathy, cervical region: Secondary | ICD-10-CM | POA: Diagnosis not present

## 2024-02-14 NOTE — Telephone Encounter (Signed)
 Cardiac Catheterization scheduled at [facility] for: Wednesday February 16, 2024 8:30 AM Arrival time [facility] Main Entrance A at: 6:30 AM  Diet: -Nothing to eat after midnight.  Hydration: -May drink clear liquids until 2 hours before the procedure.  Approved liquids: Water, clear tea, black coffee, fruit juices-non-citric and without pulp,Gatorade, plain Jello/popsicles.   -Please drink 16 oz of water 2 hours before procedure.  Medication instructions: -Hold:  Hydrochlorothiazide -AM of procedure -Other usual morning medications can be taken including aspirin  81 mg.  Plan to go home the same day, you will only stay overnight if medically necessary.  You must have responsible adult to drive you home.  Someone must be with you the first 24 hours after you arrive home.  Reviewed procedure instructions with patient.

## 2024-02-15 ENCOUNTER — Encounter

## 2024-02-15 ENCOUNTER — Other Ambulatory Visit: Payer: Self-pay | Admitting: Internal Medicine

## 2024-02-16 ENCOUNTER — Other Ambulatory Visit: Payer: Self-pay

## 2024-02-16 ENCOUNTER — Encounter (HOSPITAL_COMMUNITY): Admission: RE | Disposition: A | Payer: Self-pay | Source: Home / Self Care | Attending: Cardiology

## 2024-02-16 ENCOUNTER — Ambulatory Visit (HOSPITAL_COMMUNITY)
Admission: RE | Admit: 2024-02-16 | Discharge: 2024-02-16 | Disposition: A | Attending: Cardiology | Admitting: Cardiology

## 2024-02-16 ENCOUNTER — Other Ambulatory Visit (HOSPITAL_COMMUNITY): Payer: Self-pay

## 2024-02-16 DIAGNOSIS — I2582 Chronic total occlusion of coronary artery: Secondary | ICD-10-CM | POA: Diagnosis not present

## 2024-02-16 DIAGNOSIS — Z950 Presence of cardiac pacemaker: Secondary | ICD-10-CM | POA: Diagnosis not present

## 2024-02-16 DIAGNOSIS — G4733 Obstructive sleep apnea (adult) (pediatric): Secondary | ICD-10-CM | POA: Insufficient documentation

## 2024-02-16 DIAGNOSIS — J439 Emphysema, unspecified: Secondary | ICD-10-CM | POA: Insufficient documentation

## 2024-02-16 DIAGNOSIS — R9439 Abnormal result of other cardiovascular function study: Secondary | ICD-10-CM | POA: Diagnosis not present

## 2024-02-16 DIAGNOSIS — I10 Essential (primary) hypertension: Secondary | ICD-10-CM | POA: Diagnosis not present

## 2024-02-16 DIAGNOSIS — Z79899 Other long term (current) drug therapy: Secondary | ICD-10-CM | POA: Diagnosis not present

## 2024-02-16 DIAGNOSIS — I25810 Atherosclerosis of coronary artery bypass graft(s) without angina pectoris: Secondary | ICD-10-CM | POA: Diagnosis not present

## 2024-02-16 DIAGNOSIS — I371 Nonrheumatic pulmonary valve insufficiency: Secondary | ICD-10-CM | POA: Insufficient documentation

## 2024-02-16 DIAGNOSIS — Z87891 Personal history of nicotine dependence: Secondary | ICD-10-CM | POA: Insufficient documentation

## 2024-02-16 DIAGNOSIS — I25118 Atherosclerotic heart disease of native coronary artery with other forms of angina pectoris: Secondary | ICD-10-CM | POA: Insufficient documentation

## 2024-02-16 DIAGNOSIS — I442 Atrioventricular block, complete: Secondary | ICD-10-CM | POA: Diagnosis not present

## 2024-02-16 DIAGNOSIS — E1129 Type 2 diabetes mellitus with other diabetic kidney complication: Secondary | ICD-10-CM | POA: Diagnosis not present

## 2024-02-16 DIAGNOSIS — I2510 Atherosclerotic heart disease of native coronary artery without angina pectoris: Secondary | ICD-10-CM | POA: Diagnosis present

## 2024-02-16 DIAGNOSIS — E782 Mixed hyperlipidemia: Secondary | ICD-10-CM | POA: Insufficient documentation

## 2024-02-16 DIAGNOSIS — Z7982 Long term (current) use of aspirin: Secondary | ICD-10-CM | POA: Insufficient documentation

## 2024-02-16 DIAGNOSIS — Z955 Presence of coronary angioplasty implant and graft: Secondary | ICD-10-CM

## 2024-02-16 HISTORY — PX: CORONARY IMAGING/OCT: CATH118326

## 2024-02-16 HISTORY — PX: LEFT HEART CATH AND CORS/GRAFTS ANGIOGRAPHY: CATH118250

## 2024-02-16 HISTORY — PX: CORONARY BALLOON ANGIOPLASTY: CATH118233

## 2024-02-16 LAB — POCT ACTIVATED CLOTTING TIME
Activated Clotting Time: 320 s
Activated Clotting Time: 239 s
Activated Clotting Time: 268 s
Activated Clotting Time: 297 s

## 2024-02-16 LAB — GLUCOSE, CAPILLARY: Glucose-Capillary: 162 mg/dL — ABNORMAL HIGH (ref 70–99)

## 2024-02-16 SURGERY — LEFT HEART CATH AND CORS/GRAFTS ANGIOGRAPHY
Anesthesia: LOCAL

## 2024-02-16 MED ORDER — FENTANYL CITRATE (PF) 100 MCG/2ML IJ SOLN
INTRAMUSCULAR | Status: AC
  Filled 2024-02-16: qty 2

## 2024-02-16 MED ORDER — HEPARIN SODIUM (PORCINE) 1000 UNIT/ML IJ SOLN
INTRAMUSCULAR | Status: AC
  Filled 2024-02-16: qty 10

## 2024-02-16 MED ORDER — HEPARIN SODIUM (PORCINE) 1000 UNIT/ML IJ SOLN
INTRAMUSCULAR | Status: DC | PRN
  Administered 2024-02-16: 4000 [IU] via INTRAVENOUS
  Administered 2024-02-16: 5000 [IU] via INTRAVENOUS
  Administered 2024-02-16: 3000 [IU] via INTRAVENOUS

## 2024-02-16 MED ORDER — VERAPAMIL HCL 2.5 MG/ML IV SOLN
INTRAVENOUS | Status: AC
  Filled 2024-02-16: qty 2

## 2024-02-16 MED ORDER — HYDRALAZINE HCL 20 MG/ML IJ SOLN: 10.0000 mg | INTRAMUSCULAR | Status: DC | PRN

## 2024-02-16 MED ORDER — LIDOCAINE HCL (PF) 1 % IJ SOLN
INTRAMUSCULAR | Status: DC | PRN
  Administered 2024-02-16: 2 mL via INTRADERMAL

## 2024-02-16 MED ORDER — SODIUM CHLORIDE 0.9 % IV SOLN
INTRAVENOUS | Status: DC | PRN
  Administered 2024-02-16: 75 mL/h via INTRAVENOUS

## 2024-02-16 MED ORDER — ASPIRIN 81 MG PO CHEW: 81.0000 mg | CHEWABLE_TABLET | ORAL | Status: DC

## 2024-02-16 MED ORDER — MIDAZOLAM HCL (PF) 2 MG/2ML IJ SOLN
INTRAMUSCULAR | Status: DC | PRN
  Administered 2024-02-16 (×2): 1 mg via INTRAVENOUS

## 2024-02-16 MED ORDER — SODIUM CHLORIDE 0.9% FLUSH: 3.0000 mL | Freq: Two times a day (BID) | INTRAVENOUS | Status: DC

## 2024-02-16 MED ORDER — FENTANYL CITRATE (PF) 100 MCG/2ML IJ SOLN
INTRAMUSCULAR | Status: DC | PRN
  Administered 2024-02-16 (×2): 25 ug via INTRAVENOUS

## 2024-02-16 MED ORDER — NITROGLYCERIN 1 MG/10 ML FOR IR/CATH LAB
INTRA_ARTERIAL | Status: DC | PRN
  Administered 2024-02-16: 200 ug via INTRACORONARY
  Administered 2024-02-16: 100 ug via INTRACORONARY
  Administered 2024-02-16: 200 ug via INTRACORONARY

## 2024-02-16 MED ORDER — ASPIRIN 81 MG PO CHEW: 81.0000 mg | CHEWABLE_TABLET | Freq: Every day | ORAL | Status: DC

## 2024-02-16 MED ORDER — MIDAZOLAM HCL 2 MG/2ML IJ SOLN
INTRAMUSCULAR | Status: AC
  Filled 2024-02-16: qty 2

## 2024-02-16 MED ORDER — IOHEXOL 350 MG/ML SOLN
INTRAVENOUS | Status: DC | PRN
  Administered 2024-02-16: 200 mL

## 2024-02-16 MED ORDER — SODIUM CHLORIDE 0.9% FLUSH: 3.0000 mL | INTRAVENOUS | Status: DC | PRN

## 2024-02-16 MED ORDER — CLOPIDOGREL BISULFATE 75 MG PO TABS
75.0000 mg | ORAL_TABLET | Freq: Every day | ORAL | 2 refills | Status: AC
  Filled 2024-02-16: qty 90, 90d supply, fill #0

## 2024-02-16 MED ORDER — CLOPIDOGREL BISULFATE 300 MG PO TABS
ORAL_TABLET | ORAL | Status: AC
  Filled 2024-02-16: qty 2

## 2024-02-16 MED ORDER — SODIUM CHLORIDE 0.9 % IV SOLN: INTRAVENOUS | Status: DC

## 2024-02-16 MED ORDER — SODIUM CHLORIDE 0.9 % IV SOLN: 250.0000 mL | INTRAVENOUS | Status: DC | PRN

## 2024-02-16 MED ORDER — ACETAMINOPHEN 325 MG PO TABS: 650.0000 mg | ORAL_TABLET | ORAL | Status: DC | PRN

## 2024-02-16 MED ORDER — FREE WATER: 500.0000 mL | Freq: Once | Status: DC

## 2024-02-16 MED ORDER — SODIUM CHLORIDE 0.9 % IV SOLN
INTRAVENOUS | Status: DC | PRN
  Administered 2024-02-16: 250 mL via INTRAVENOUS

## 2024-02-16 MED ORDER — NITROGLYCERIN 1 MG/10 ML FOR IR/CATH LAB
INTRA_ARTERIAL | Status: AC
  Filled 2024-02-16: qty 10

## 2024-02-16 MED ORDER — ONDANSETRON HCL 4 MG/2ML IJ SOLN: 4.0000 mg | Freq: Four times a day (QID) | INTRAMUSCULAR | Status: DC | PRN

## 2024-02-16 MED ORDER — HEPARIN (PORCINE) IN NACL 2-0.9 UNITS/ML
INTRAMUSCULAR | Status: DC | PRN
  Administered 2024-02-16: 10 mL via INTRA_ARTERIAL

## 2024-02-16 MED ORDER — HEPARIN (PORCINE) IN NACL 1000-0.9 UT/500ML-% IV SOLN
INTRAVENOUS | Status: DC | PRN
  Administered 2024-02-16: 1000 mL
  Administered 2024-02-16: 500 mL

## 2024-02-16 MED ORDER — CLOPIDOGREL BISULFATE 75 MG PO TABS: 75.0000 mg | ORAL_TABLET | Freq: Every day | ORAL | Status: DC

## 2024-02-16 MED ORDER — CLOPIDOGREL BISULFATE 300 MG PO TABS
ORAL_TABLET | ORAL | Status: DC | PRN
  Administered 2024-02-16: 600 mg via ORAL

## 2024-02-16 MED ORDER — LABETALOL HCL 5 MG/ML IV SOLN: 10.0000 mg | INTRAVENOUS | Status: DC | PRN

## 2024-02-16 MED ORDER — PANTOPRAZOLE SODIUM 40 MG PO TBEC
40.0000 mg | DELAYED_RELEASE_TABLET | Freq: Every day | ORAL | 6 refills | Status: AC
  Filled 2024-02-16: qty 30, 30d supply, fill #0

## 2024-02-16 SURGICAL SUPPLY — 25 items
BALL DC AGENT 2.50X30 (BALLOONS) IMPLANT
BALLOON EMERGE MR 2.0X12 (BALLOONS) IMPLANT
BALLOON SCOREFLEX 2.50X15 (BALLOONS) IMPLANT
BALLOON WOLVERINE 2.00X10 (BALLOONS) IMPLANT
BALLOON WOLVERINE 2.50X15 (BALLOONS) IMPLANT
BALLOON ~~LOC~~ EMERGE MR 2.5X15 (BALLOONS) IMPLANT
BALLOON ~~LOC~~ EMERGE MR 2.5X20 (BALLOONS) IMPLANT
BALLOON ~~LOC~~ EUPHORA RX 2.5X12 (BALLOONS) IMPLANT
CATH DRAGONFLY OPSTAR (CATHETERS) IMPLANT
CATH EXPO 5F MPA-1 (CATHETERS) IMPLANT
CATH INFINITI 5 FR IM (CATHETERS) IMPLANT
CATH INFINITI 5FR MULTPACK ANG (CATHETERS) IMPLANT
CATH INFINITI 6F AL1 (CATHETERS) IMPLANT
CATH LAUNCHER 6FR AL1 (CATHETERS) IMPLANT
DEVICE RAD COMP TR BAND LRG (VASCULAR PRODUCTS) IMPLANT
GLIDESHEATH SLEND SS 6F .021 (SHEATH) IMPLANT
GUIDEWIRE INQWIRE 1.5J.035X260 (WIRE) IMPLANT
KIT ENCORE 26 ADVANTAGE (KITS) IMPLANT
KIT ESSENTIALS PG (KITS) IMPLANT
KIT SYRINGE INJ CVI SPIKEX1 (MISCELLANEOUS) IMPLANT
PACK CARDIAC CATHETERIZATION (CUSTOM PROCEDURE TRAY) ×2 IMPLANT
SET ATX-X65L (MISCELLANEOUS) IMPLANT
SHEATH PROBE COVER 6X72 (BAG) IMPLANT
WIRE ASAHI PROWATER 180CM (WIRE) IMPLANT
WIRE RUNTHROUGH .014X180CM (WIRE) IMPLANT

## 2024-02-16 NOTE — Interval H&P Note (Signed)
 History and Physical Interval Note:  02/16/2024 8:01 AM  Daniel Carey Daniel Carey  has presented today for surgery, with the diagnosis of chest pain.  The various methods of treatment have been discussed with the patient and family. After consideration of risks, benefits and other options for treatment, the patient has consented to  Procedure(s): LEFT HEART CATH AND CORS/GRAFTS ANGIOGRAPHY (N/A)  PERCUTANEOUS CORONARY INTERVENTION   as a surgical intervention.  The patient's history has been reviewed, patient examined, no change in status, stable for surgery.  I have reviewed the patient's chart and labs.  Questions were answered to the patient's satisfaction.    Cath Lab Visit (complete for each Cath Lab visit)  Clinical Evaluation Leading to the Procedure:   ACS: No.  Non-ACS:    Anginal Classification: CCS II  Anti-ischemic medical therapy: Minimal Therapy (1 class of medications)  Non-Invasive Test Results: High-risk stress test findings: cardiac mortality >3%/year  Prior CABG: No previous CABG       Daniel Carey

## 2024-02-16 NOTE — Discharge Summary (Signed)
 Discharge Summary for Same Day PCI   Patient ID: Daniel Carey MRN: [id number]; DOB: 1950/09/14  Admit date: 02/16/2024 Discharge date: 02/16/2024  Primary Care Provider: Kennyth Worth HERO, MD  Primary Cardiologist: Lonni LITTIE Nanas, MD  Primary Electrophysiologist:  Danelle Birmingham, MD   Discharge Diagnoses    Active Problems:   CAD (coronary artery disease)   Diagnostic Studies/Procedures    Cardiac Catheterization 02/16/2024:    Ost LAD lesion is 20% stenosed.  Prox LAD lesion is 65% stenosed with side branch in 1st Sept. Ost 1st Diag lesion is 50% stenosed.   Prox LAD lesion is 65% stenosed. Mid LAD lesion is 70% stenosed. . Mid-LAD-1 lesion is 100% stenosed.   Prox Cx to Mid Cx lesion is 60% stenosed.  1st Mrg lesion is 100% stenosed. 2nd Mrg lesion is 90% stenosed. (Both vessels are less than 1 mm).  3rd Mrg-1 lesion is 80% stenosed. 3rd Mrg-2 lesion is 100% stenosed.   ---------------------CULPRIT LESION SEGMENT----------------------------------   Ost RCA to Prox RCA lesion is 40% stenosed.   CULPRIT LESION SEGMENT: Mid RCA-1 lesion is 70% stenosed. Mid RCA-2 lesion is 95% stenosed.  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 => Post intervention, there is a 30% residual stenosis in the more proximal 70% stenosis segment; post intervention, there is a 0% residual stenosis.  TIMI-3 flow maintained   Dist RCA lesion is 40% stenosed.   -----------------Grafts--------------------------------------   LIMA-LAD graft was visualized by angiography and is moderate in size. The graft exhibits no disease. There is MILD competitive flow   SVG-OM3 graft was visualized by angiography and is small. The graft exhibits no disease. The flow is not reversed. There is no competitive flow   SVG graft was visualized  by angiography and is normal in caliber.  Origin to Prox Graft lesion is 100% stenosed.   -------------------------------------------------------   LV end diastolic pressure is normal.   There is no aortic valve stenosis.   Diagnostic    Dominance: Right                                                  Intervention  Severe native vessel CAD with relatively small caliber native vessels: Large-caliber left main bifurcates into moderate to small caliber LAD and LCx:  LAD has diffuse disease with a 70% stenosis at SP1 and diffuse disease before it is nearly occluded at the insertion of the LIMA graft;  LCx is also diffusely diseased with mild to moderate disease with likely occluded OM1 and 89% stenosis in tiny OM 2, there is an 100 send occluded at what appears to be graft insertion of the OM 3 just after an 80% stenosis. Native RCA is small to moderate in caliber and there is extensive stent from the proximal to distal vessel with very small caliber RPDA and RPL system. There are several areas of mild stenosis in the stented segment but the culprit lesion is a 95% ISR in the distal portion of the stent with a more upstream 70% concentric stenosis. Successful very difficult ScoreFlex and Cutting Balloon PTCA of long segment including the 70 to 95% stenoses of the stented segment of the RCA with final treatment using a 2.5 mm  x 30 mm Agent DCB Complex procedure, required buddy wires, extensive segment to treat.  Difficulty advancing wires.  Multiple different types of balloons used. GRAFTS Patent LIMA to distal LAD and patent small caliber SVG to distal OM 3 with CTO of SVG-PDA Normal LVEDP      RECOMMEDATIONS   In the absence of any other complications or medical issues, we expect the patient to be ready for discharge from an interventional cardiology perspective on 02/16/2024.   Recommend uninterrupted dual antiplatelet therapy with Aspirin  81mg  daily and Clopidogrel  75mg  daily for a minimum of 6  months (stable ischemic heart disease-Class I recommendation).   Would like to continue Plavix  monotherapy for the next 6 months but okay to interrupt.  If possible would continue Plavix  monotherapy long-term provided no bleeding issues.       Daniel MICAEL Clay, MD, MS Daniel Carey, M.D., M.S. _____________   History of Present Illness     Daniel Carey is a 73 y.o. male with a hx of CAD status post CABG, CHB status post PPM, COPD, hypertension, hyperlipidemia, OSA who presents for follow-up.  Previously followed with Dr. Alveta.  Cath 06/2020 showed severe multivessel disease.  Echocardiogram 06/2020 showed EF 55 to 60%.  Underwent CABG on 07/19/2020 with LIMA-LAD, SVG-OM 3, SVG-PDA.   He reported chest pain and underwent echocardiogram 12/29/2023 which showed normal biventricular function, moderate pulm monic regurgitation.  Stress PET 02/08/2024 showed severe ischemia in LCx territory, normal global myocardial blood flow reserve but decreased in LCx territory (1.62), drop in LVEF with stress, overall high risk study.   Since last clinic visit, he reports he continues to have chest pain with exertion.  Reports that walking up a flight of stairs will feel chest pain.  Typically resolves within a few minutes with rest, has not had to use nitroglycerin .  Also reports having dyspnea with exertion.  Cardiac catheterization was arranged for further evaluation.  Hospital Course     The patient underwent cardiac cath as noted above with severe native CAD, patent LIMA-LAD, SVG-OM3, occluded SVG-PDA with ISR of m/d RCA with cutting balloon to long segment of 70-95% stenosis. Plan for DAPT with ASA/plavix  for at least 6 months. The patient was seen by cardiac rehab while in short stay. There were no observed complications post cath. Radial cath site was re-evaluated prior to discharge and found to be stable without any complications. Instructions/precautions regarding cath site care were given prior to  discharge.  Daniel Carey was seen by Dr. Clay and determined stable for discharge home. Follow up with our office has been arranged. Medications are listed below. Pertinent changes include addition of plavix .    _____________  Cath/PCI Registry Performance & Quality Measures: Aspirin  prescribed? - Yes ADP Receptor Inhibitor (Plavix /Clopidogrel , Brilinta/Ticagrelor or Effient/Prasugrel) prescribed (includes medically managed patients)? - Yes High Intensity Statin (Lipitor  40-80mg  or Crestor  20-40mg ) prescribed? - Yes For EF <40%, was ACEI/ARB prescribed? - Not Applicable (EF >/= 40%) For EF <40%, Aldosterone Antagonist (Spironolactone or Eplerenone) prescribed? - Not Applicable (EF >/= 40%) Cardiac Rehab Phase II ordered (Included Medically managed Patients)? - Yes  _____________   Discharge Vitals Blood pressure (!) 152/76, pulse 64, temperature 97.8 F (36.6 C), temperature source Oral, resp. rate 15, height 5' 9 (1.753 m), weight 83.5 kg, SpO2 98%.  Filed Weights   02/16/24 0658  Weight: 83.5 kg    Last Labs & Radiologic Studies    CBC No results for input(s): WBC, NEUTROABS, HGB, HCT, MCV,  PLT in the last 72 hours. Basic Metabolic Panel No results for input(s): NA, K, CL, CO2, GLUCOSE, BUN, CREATININE, CALCIUM , MG, PHOS in the last 72 hours. Liver Function Tests No results for input(s): AST, ALT, ALKPHOS, BILITOT, PROT, ALBUMIN  in the last 72 hours. No results for input(s): LIPASE, AMYLASE in the last 72 hours. High Sensitivity Troponin:   No results for input(s): TROPONINIHS in the last 720 hours.  BNP Invalid input(s): POCBNP D-Dimer No results for input(s): DDIMER in the last 72 hours. Hemoglobin A1C No results for input(s): HGBA1C in the last 72 hours. Fasting Lipid Panel No results for input(s): CHOL, HDL, LDLCALC, TRIG, CHOLHDL, LDLDIRECT in the last 72 hours. Thyroid  Function Tests No  results for input(s): TSH, T4TOTAL, T3FREE, THYROIDAB in the last 72 hours.  Invalid input(s): FREET3 _____________  CARDIAC CATHETERIZATION Result Date: 02/16/2024 Images from the original result were not included.   Ost LAD lesion is 20% stenosed.  Prox LAD lesion is 65% stenosed with side branch in 1st Sept. Ost 1st Diag lesion is 50% stenosed.   Prox LAD lesion is 65% stenosed. Mid LAD lesion is 70% stenosed. . Mid-LAD-1 lesion is 100% stenosed.   Prox Cx to Mid Cx lesion is 60% stenosed.  1st Mrg lesion is 100% stenosed. 2nd Mrg lesion is 90% stenosed. (Both vessels are less than 1 mm).  3rd Mrg-1 lesion is 80% stenosed. 3rd Mrg-2 lesion is 100% stenosed.   ---------------------CULPRIT LESION SEGMENT----------------------------------   Ost RCA to Prox RCA lesion is 40% stenosed.   CULPRIT LESION SEGMENT: Mid RCA-1 lesion is 70% stenosed. Mid RCA-2 lesion is 95% stenosed.  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 => Post intervention, there is a 30% residual stenosis in the more proximal 70% stenosis segment; post intervention, there is a 0% residual stenosis.  TIMI-3 flow maintained   Dist RCA lesion is 40% stenosed.   -----------------Grafts--------------------------------------   LIMA-LAD graft was visualized by angiography and is moderate in size. The graft exhibits no disease. There is MILD competitive flow   SVG-OM3 graft was visualized by angiography and is small. The graft exhibits no disease. The flow is not reversed. There is no competitive flow   SVG graft was visualized by angiography and is normal in caliber.  Origin to Prox Graft lesion is 100% stenosed.   -------------------------------------------------------   LV end diastolic pressure is normal.   There is no aortic valve stenosis. Diagnostic  Dominance: Right     Intervention Severe native vessel CAD with relatively small caliber native vessels: Large-caliber left main bifurcates into moderate to small caliber LAD and LCx: LAD has diffuse disease with a 70% stenosis at SP1 and diffuse disease before it is nearly occluded at the insertion of the LIMA graft; LCx is also diffusely diseased with mild to moderate disease with likely occluded OM1 and 89% stenosis in tiny OM 2, there is an 100 send occluded at what appears to be graft insertion of the OM 3 just after an 80% stenosis. Native RCA is small to moderate in caliber and there is extensive stent from the proximal to distal vessel with very small caliber RPDA and RPL system. There are several areas of mild stenosis in the stented segment but the culprit lesion is a 95% ISR in the distal portion of the stent with a more upstream 70% concentric stenosis. Successful very difficult  ScoreFlex and Cutting Balloon PTCA of long segment including the 70 to 95% stenoses of the stented segment of the RCA with final treatment using a 2.5 mm x 30 mm Agent DCB Complex procedure, required buddy wires, extensive segment to treat.  Difficulty advancing wires.  Multiple different types of balloons used. GRAFTS Patent LIMA to distal LAD and patent small caliber SVG to distal OM 3 with CTO of SVG-PDA Normal LVEDP RECOMMEDATIONS   In the absence of any other complications or medical issues, we expect the patient to be ready for discharge from an interventional cardiology perspective on 02/16/2024.   Recommend uninterrupted dual antiplatelet therapy with Aspirin  81mg  daily and Clopidogrel  75mg  daily for a minimum of 6 months (stable ischemic heart disease-Class I recommendation).   Would like to continue Plavix  monotherapy for the next 6 months but okay to interrupt.  If possible would continue Plavix  monotherapy long-term provided no bleeding issues. Daniel MICAEL Clay, MD, MS Daniel Carey, M.D., M.S. Interventional  Cardiologist [facility] Pager # [phone]  NM PET CT CARDIAC PERFUSION MULTI W/ABSOLUTE BLOODFLOW Result Date: 02/08/2024   Findings are consistent with ischemia. The study is high risk due to a medium size area of severe ischemia in the left circumflex coronary artery territory.   LV perfusion is abnormal. There is evidence of ischemia. There is no evidence of infarction. Defect 1: There is a medium defect with severe reduction in uptake present in the mid to basal anterolateral and inferolateral location(s) that is reversible. There is normal wall motion in the defect area. Consistent with ischemia. The defect is consistent with abnormal perfusion in the LCx territory.   Rest left ventricular function is normal. Rest EF: 58%. Stress left ventricular function is abnormal. Stress EF: 54%. End diastolic cavity size is normal. End systolic cavity size is normal.   Myocardial blood flow was computed to be 0.42ml/g/min at rest and 1.62ml/g/min at stress. Global myocardial blood flow reserve was 2.08 and was normal. However, in the left circumflex distribution the flow reserveis abnormal at 1.62. Flow reserve analysis is less reliable in the setting of previous CABG.   Coronary calcium  assessment not performed due to prior revascularization.   Electronically signed by Jerel Balding, MD EXAM: OVER-READ INTERPRETATION  CT CHEST The following report is a limited chest CT over-read performed by radiologist Dr. Elsie Ko [facility] on 02/08/2024. This over-read does not include interpretation of cardiac or coronary anatomy or pathology nor does it include evaluation of the PET data. The cardiac PET-CT interpretation by the cardiologist is attached. COMPARISON:  Chest radiograph 10/09/2020.  Chest CT 07/14/2011. FINDINGS: Mediastinum/Nodes: No enlarged lymph nodes within the visualized mediastinum.Aortic and coronary artery atherosclerosis post median sternotomy, CABG and pacemaker  placement. Lungs/Pleura: No pleural effusion or pneumothorax. Mild emphysematous changes. No confluent airspace disease or suspicious nodularity. Upper abdomen: No acute findings are seen in the visualized upper abdomen. There are scattered calcified splenic granulomas. A partially imaged right adrenal adenoma is grossly unchanged to the colon no specific follow-up imaging recommended. Musculoskeletal/Chest wall: No chest wall mass or suspicious osseous findings within the visualized chest on axial imaging. Mild thoracic spondylosis. IMPRESSION: No significant extracardiac findings within the visualized chest. Electronically Signed   By: Elsie Perone M.D.   On: 02/08/2024 11:33   Disposition   Pt is being discharged home today in good condition.  Follow-up Plans & Appointments     Discharge Instructions     Amb Referral to Cardiac Rehabilitation  Complete by: As directed    Diagnosis:  Coronary Stents PTCA     After initial evaluation and assessments completed: Virtual Based Care may be provided alone or in conjunction with Phase 2 Cardiac Rehab based on patient barriers.: Yes   Intensive Cardiac Rehabilitation (ICR) [facility] location only OR Traditional Cardiac Rehabilitation (TCR) *If criteria for ICR are not met will enroll in TCR [facility] only): Yes        Discharge Medications   Allergies as of 02/16/2024       Reactions   Amlodipine Besylate Other (See Comments)   Headache, weakness, dizziness   Propofol  Other (See Comments)   Heart rate dropped   Lisinopril  Cough, Rash   Rosuvastatin  Other (See Comments)    Corncern for muscle aches but he is tolerating now        Medication List     STOP taking these medications    diclofenac 75 MG EC tablet Commonly known as: VOLTAREN   omeprazole  20 MG tablet Commonly known as: PRILOSEC OTC       TAKE these medications    Accu-Chek Guide w/Device Kit Use as advised   acetaminophen  500 MG tablet Commonly known as:  TYLENOL  Take 1,000 mg by mouth every 6 (six) hours as needed (pain).   albuterol  108 (90 Base) MCG/ACT inhaler Commonly known as: VENTOLIN  HFA INHALE 2 PUFFS INTO THE LUNGS EVERY 6 HOURS AS NEEDED FOR WHEEZE OR SHORTNESS OF BREATH   aspirin  EC 81 MG tablet Take 1 tablet (81 mg total) by mouth daily. Swallow whole.   benzonatate  200 MG capsule Commonly known as: TESSALON  Take 1 capsule (200 mg total) by mouth 2 (two) times daily as needed for cough.   clopidogrel  75 MG tablet Commonly known as: PLAVIX  Take 1 tablet (75 mg total) by mouth daily with breakfast. Start taking on: February 17, 2024   ezetimibe  10 MG tablet Commonly known as: ZETIA  Take 1 tablet (10 mg total) by mouth daily.   famotidine  20 MG tablet Commonly known as: PEPCID  Take 20 mg by mouth daily as needed for heartburn.   fenofibrate  145 MG tablet Commonly known as: TRICOR  TAKE 1 TABLET BY MOUTH EVERY DAY   glucose blood test strip Use as instructed 1x a day   hydrochlorothiazide  25 MG tablet Commonly known as: HYDRODIURIL  TAKE 1/2 TABLET (12.5 MG TOTAL) BY MOUTH DAILY AS NEEDED.   ipratropium 0.06 % nasal spray Commonly known as: ATROVENT  PLACE 2 SPRAYS INTO BOTH NOSTRILS 4 (FOUR) TIMES DAILY AS DIRECTED What changed: See the new instructions.   metoprolol  succinate 25 MG 24 hr tablet Commonly known as: Toprol  XL Take 1 tablet (25 mg total) by mouth daily.   Na Sulfate-K Sulfate-Mg Sulfate concentrate 17.5-3.13-1.6 GM/177ML Soln Commonly known as: SUPREP TAKE 1 KIT (354 MLS TOTAL) BY MOUTH ONCE FOR 1 DOSE.   nitroGLYCERIN  0.4 MG SL tablet Commonly known as: Nitrostat  Place 1 tablet (0.4 mg total) under the tongue every 5 (five) minutes as needed for chest pain.   pantoprazole  40 MG tablet Commonly known as: Protonix  Take 1 tablet (40 mg total) by mouth daily.   Pharmacist Choice Lancets Misc Use to check blood sugar 1-2 times a day   Accu-Chek Softclix Lancets lancets Use as instructed 1x  a day   rosuvastatin  20 MG tablet Commonly known as: CRESTOR  TAKE 1 TABLET BY MOUTH EVERY DAY   sildenafil  100 MG tablet Commonly known as: VIAGRA  Take 1 tablet by mouth prior to sexual activity  as needed   Systane Complete 0.6 % Soln Generic drug: Propylene Glycol Place 1 drop into both eyes daily.        Allergies Allergies  Allergen Reactions   Amlodipine Besylate Other (See Comments)    Headache, weakness, dizziness   Propofol  Other (See Comments)    Heart rate dropped     Lisinopril  Cough and Rash   Rosuvastatin  Other (See Comments)     Corncern for muscle aches but he is tolerating now     Outstanding Labs/Studies   N/a   Duration of Discharge Encounter   Greater than 30 minutes including physician time.  Signed, Manuelita Rummer, NP 02/16/2024, 2:44 PM

## 2024-02-16 NOTE — Discharge Instructions (Signed)

## 2024-02-16 NOTE — Progress Notes (Signed)
 Discussed with pt and wife PTCA, Plavix , restrictions, diet, exercise, NTG, and CRPII. Pt receptive. Will refer to [facility] CRPII.  [id number] Aliene Aris BS, ACSM-CEP 02/16/2024 2:28 PM

## 2024-02-17 ENCOUNTER — Telehealth: Payer: Self-pay | Admitting: Internal Medicine

## 2024-02-17 ENCOUNTER — Encounter (HOSPITAL_COMMUNITY): Payer: Self-pay | Admitting: Cardiology

## 2024-02-17 ENCOUNTER — Telehealth: Payer: Self-pay

## 2024-02-17 NOTE — Telephone Encounter (Signed)
 Noted, colon has been cancelled and recall in epic for 6 mth.

## 2024-02-17 NOTE — Telephone Encounter (Signed)
 See additional note, pt cannot come off plavix  for 6 mths. Colon cancelled and recall entered in epic for . Pts wife left detailed message.

## 2024-02-17 NOTE — Telephone Encounter (Signed)
 Would recommend patient postpone colonoscopy unless it is urgent given coronary intervention on 02/16/24. Recommendation is for 6 months of uninterrupted DAPT, however could consider sooner hold with approval of primary cardiologist.   Rosaline EMERSON Bane, NP-C  02/17/2024, 11:09 AM [address], [address] [place], [place] [address] Office [phone] Fax [phone]

## 2024-02-17 NOTE — Telephone Encounter (Signed)
 Pt is scheduled for a colonoscopy with Dr. Albertus on 02/29/24. Pts wife called this am wanting to know when/how long pt needed to be off plavix  for procedure. Pt just had cardiac cath yesterday and it looks like from notes he should not have anticoag interrupted for 6 months but at the end of 6 mth he could interrupt the plavix  for procedure. Please advise and we can adjust his colonoscopy appt accordingly.

## 2024-02-17 NOTE — Telephone Encounter (Signed)
 Pts wife calling asking when pt needs to stop plavix  that he just started yesterday, pt had a heart cath done yesterday. I can contac cardiology regarding holding plavix  but would pt still be ok to proceed with procedure in the [facility] with this recent cath? Please advise.

## 2024-02-17 NOTE — Telephone Encounter (Signed)
 Will update the requesting office to see notes from preop APP Rosaline Bane, NP.   Please reply if URGENT; if so this will need to be reevaluated by cardiologist.

## 2024-02-17 NOTE — Telephone Encounter (Signed)
 Separate note sent to cardiology for clarification.

## 2024-02-17 NOTE — Telephone Encounter (Signed)
 Rock, thanks for bringing this to my attention.  I have reviewed the cardiac catheterization from yesterday.  He did have balloon angioplasty.  The recommendations from Dr. Anner need further clarification. His recommendations taken from the catheterization report are as follows: RECOMMEDATIONS   In the absence of any other complications or medical issues, we expect the patient to be ready for discharge from an interventional cardiology perspective on 02/16/2024.   Recommend uninterrupted dual antiplatelet therapy with Aspirin  81mg  daily and Clopidogrel  75mg  daily for a minimum of 6 months (stable ischemic heart disease-Class I recommendation).   Would like to continue Plavix  monotherapy for the next 6 months but okay to interrupt.  If possible would continue Plavix  monotherapy long-term provided no bleeding issues.  The second bullet point says uninterrupted Plavix  for minimum of 6 months The third bullet point says would like to continue Plavix  for the next 6 months but okay to interrupt -- I interpret this to mean uninterrupted x 6 months and then after 6 months Plavix  could be interrupted.  Please confirm this with cardiology. -- If this is the case then his colonoscopy will need to be delayed until at least after August 15, 2024

## 2024-02-17 NOTE — Telephone Encounter (Signed)
 Inbound call from patients wife stating she would like to know when husband should hold new medication he started called plavix  due to having a procedure on 02/29/24. Requesting a call back  Please advise  Thank you

## 2024-02-17 NOTE — Telephone Encounter (Signed)
 See additional note. Pt may not come off plavix  for 6 mth. Colon cancelled and recall placed for 6 mths. Pts wife aware.

## 2024-02-17 NOTE — Telephone Encounter (Signed)
 Left detailed message for pts wife that his colon will have to be cancelled and rescheduled in April when his plavix  can be interrupted.  Recall entered in epic for colon in April.

## 2024-02-23 ENCOUNTER — Ambulatory Visit: Admitting: Cardiology

## 2024-02-24 ENCOUNTER — Ambulatory Visit: Attending: Neurosurgery | Admitting: Physical Therapy

## 2024-02-24 ENCOUNTER — Other Ambulatory Visit: Payer: Self-pay

## 2024-02-24 DIAGNOSIS — M542 Cervicalgia: Secondary | ICD-10-CM | POA: Insufficient documentation

## 2024-02-24 DIAGNOSIS — M62838 Other muscle spasm: Secondary | ICD-10-CM | POA: Insufficient documentation

## 2024-02-24 NOTE — Therapy (Signed)
 OUTPATIENT PHYSICAL THERAPY CERVICAL EVALUATION   Patient Name: Daniel Carey MRN: [id number] DOB:1951-03-02, 73 y.o., male Today's Date: 02/24/2024  END OF SESSION:  PT End of Session - 02/24/24 1106     Visit Number 1    Number of Visits 8    Date for Recertification  03/23/24    PT Start Time 1043    PT Stop Time 1122    PT Time Calculation (min) 39 min    Activity Tolerance Patient tolerated treatment well    Behavior During Therapy [facility] for tasks assessed/performed          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    noted on EKG March 2013   CAD (coronary artery disease)    s/p atherectomy, PTCA/DES x2 mid & distal RCA 06/30/12   Carotid artery occlusion    Chest pain    Complication of anesthesia    heart rate dropping with propofol  last procedure   COPD (chronic obstructive pulmonary disease) (HCC)    Dyspnea    ED (erectile dysfunction)    Esophageal stricture    External hemorrhoids without mention of complication    GERD (gastroesophageal reflux disease)    Hiatal hernia    History of kidney stones    HLD (hyperlipidemia)    HTN (hypertension)    Myocardial infarction (HCC)    1996   Personal history of colonic polyps 03/12/2010   TUBULAR ADENOMA   Pneumonia    Presence of permanent cardiac pacemaker    Sleep apnea    wears CPAP   Tobacco abuse    Tubular adenoma of colon    Type 2 diabetes mellitus (HCC)    Past Surgical History:  Procedure Laterality Date   ANGIOPLASTY     X3   ANTERIOR CERVICAL DECOMP/DISCECTOMY FUSION N/A 11/25/2022   Procedure: Anterior Cervical Decompression/Discectomy Fusion Cervical Three-Four;  Surgeon: Debby Dorn MATSU, MD;  Location: [facility] OR;  Service: Neurosurgery;  Laterality: N/A;   CARDIAC CATHETERIZATION     8 years ago stents   COLONOSCOPY N/A 02/16/2017   Procedure: COLONOSCOPY;  Surgeon: Albertus Gordy HERO, MD;  Location: [facility];  Service: Gastroenterology;   Laterality: N/A;   COLONOSCOPY     CORONARY ARTERY BYPASS GRAFT N/A 07/19/2020   Procedure: CORONARY ARTERY BYPASS GRAFTING (CABG) TIMES THREE USING LEFT INTERNAL MAMMARY ARTERY AND RIGHT GREATER SAPHENOUS VEIN HARVESTED ENDOSCOPICALLY;  Surgeon: Lucas Dorise POUR, MD;  Location: [facility] OR;  Service: Open Heart Surgery;  Laterality: N/A;   CORONARY BALLOON ANGIOPLASTY N/A 02/16/2024   Procedure: CORONARY BALLOON ANGIOPLASTY;  Surgeon: Anner Alm ORN, MD;  Location: [facility] INVASIVE CV LAB;  Service: Cardiovascular;  Laterality: N/A;   CORONARY IMAGING/OCT N/A 02/16/2024   Procedure: CORONARY IMAGING/OCT;  Surgeon: Anner Alm ORN, MD;  Location: [facility] INVASIVE CV LAB;  Service: Cardiovascular;  Laterality: N/A;   INSERT / REPLACE / REMOVE PACEMAKER  06/20/2018   LEFT HEART CATH AND CORONARY ANGIOGRAPHY N/A 11/05/2016   Procedure: Left Heart Cath and Coronary Angiography;  Surgeon: Mady Bruckner, MD;  Location: [facility] INVASIVE CV LAB;  Service: Cardiovascular;  Laterality: N/A;   LEFT HEART CATH AND CORONARY ANGIOGRAPHY N/A 07/12/2020   Procedure: LEFT HEART CATH AND CORONARY ANGIOGRAPHY;  Surgeon: Verlin Bruckner BIRCH, MD;  Location: [facility] INVASIVE CV LAB;  Service: Cardiovascular;  Laterality: N/A;   LEFT HEART CATH AND CORS/GRAFTS ANGIOGRAPHY N/A 02/16/2024   Procedure: LEFT  HEART CATH AND CORS/GRAFTS ANGIOGRAPHY;  Surgeon: Anner Alm ORN, MD;  Location: [facility] INVASIVE CV LAB;  Service: Cardiovascular;  Laterality: N/A;   LUMBAR LAMINECTOMY/DECOMPRESSION MICRODISCECTOMY N/A 05/12/2018   Procedure: Microlumbar decompression L2-3, possible L1-2;  Surgeon: Duwayne Purchase, MD;  Location: [facility] OR;  Service: Orthopedics;  Laterality: N/A;  2 hrs, Microlumbar decompression L2-3, possible L1-2   PACEMAKER IMPLANT N/A 06/20/2018   Procedure: PACEMAKER IMPLANT;  Surgeon: Waddell Danelle ORN, MD;  Location: [facility] INVASIVE CV LAB;  Service: Cardiovascular;  Laterality: N/A;   PERCUTANEOUS CORONARY ROTOBLATOR INTERVENTION (PCI-R)  N/A 06/30/2012   Procedure: PERCUTANEOUS CORONARY ROTOBLATOR INTERVENTION (PCI-R);  Surgeon: Lonni JONETTA Cash, MD;  Location: [facility] CATH LAB;  Service: Cardiovascular;  Laterality: N/A;   TEE WITHOUT CARDIOVERSION N/A 07/19/2020   Procedure: TRANSESOPHAGEAL ECHOCARDIOGRAM (TEE);  Surgeon: Lucas Dorise POUR, MD;  Location: [facility] OR;  Service: Open Heart Surgery;  Laterality: N/A;   VIDEO BRONCHOSCOPY  08/13/2011   Procedure: VIDEO BRONCHOSCOPY WITHOUT FLUORO;  Surgeon: Ozell KATHEE America, MD;  Location: [facility];  Service: Cardiopulmonary;  Laterality: Bilateral;   Patient Active Problem List   Diagnosis Date Noted   Cervical radiculopathy 12/22/2022   Carotid artery stenosis 06/12/2022   Pain of left hip joint 11/24/2021   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    THERAPY DIAG:  Cervicalgia  Other muscle spasm  Rationale for Evaluation and Treatment: Rehabilitation  ONSET DATE: Ongoing.    SUBJECTIVE:                                                                                                                                                                                                         SUBJECTIVE STATEMENT: The patient presents to the clinic with c/o left-sided neck pain and headaches. He underwent a ACDF (C3-4) in August of last year.  He rates he pain at a 6/10 and describes it as an ache and throbbing.  He cannot think of anything that really increases his pain and he  has not found anything decreases her pain.  PERTINENT HISTORY:  Pacemaker.    PAIN:  Are you having pain? Yes: NPRS scale: 5-6/10.  Pain location: Left cervical.   Pain description: As above.   Aggravating factors: As above.   Relieving factors: As above.    PRECAUTIONS: ICD/Pacemaker  RED FLAGS: None     WEIGHT BEARING RESTRICTIONS: No  FALLS:  Has patient fallen in last 6 months? No  LIVING ENVIRONMENT: Lives with: lives with their spouse Lives in: House/apartment Has following equipment at home: None  PLOF: Independent  PATIENT GOALS: Not have headaches and neck pain.     OBJECTIVE:   PATIENT SURVEYS:  NDI:  13/50 (26%).   POSTURE: rounded shoulders  PALPATION: Tender to palpation over left upper cervical paraspinal musculature and UT with active trigger points noted.     CERVICAL ROM:   Right active cervical rotation is 60 degrees and left is 50 degrees.    UPPER EXTREMITY ROM:  WNL.  UPPER EXTREMITY MMT:  WNL.   TREATMENT DATE: 02/24/24:  HMP x 6 minutes to patient's left cervical region (  Normal modality resposne following removal of modality) f/b STW/M x 8 minutes.                                                                                                                                   PATIENT EDUCATION:  Education details: Discussed correct posture. Person educated: Patient Education method: Medical Illustrator Education comprehension: verbalized understanding  HOME EXERCISE PROGRAM:   ASSESSMENT:  CLINICAL IMPRESSION: The patient presents to OPPT with c/o left-sided neck pain and headaches.  He is tender to palpation over left upper cervical paraspinal musculature and UT with active trigger points noted. He has some loss of active bilateral cervical rotation.  His NDI score is 13/50 (26%).  Patient will benefit from skilled physical therapy intervention to address pain and deficits.  OBJECTIVE IMPAIRMENTS:  decreased ROM, increased muscle spasms, postural dysfunction, and pain.   ACTIVITY LIMITATIONS: sitting with head down.    PARTICIPATION LIMITATIONS: Golf.    PERSONAL FACTORS: Time since onset of injury/illness/exacerbation and 1 comorbidity: ACDF, pacemaker are also affecting patient's functional outcome.   REHAB POTENTIAL: Good  CLINICAL DECISION MAKING: Stable/uncomplicated  EVALUATION COMPLEXITY: Low   GOALS:  SHORT TERM GOALS: Target date: 03/23/24.  Ind with a HEP.  Goal status: INITIAL  2.  Increase active cervical rotation to 65 degrees+ so patient can turn head more easily while driving. Goal status: INITIAL  3.  No more than one headache per week with a decrease in intensity by 50%.  Goal status: INITIAL  4.  Improve NDI score by 3 points.    Goal status: INITIAL   PLAN:  PT FREQUENCY/DURATION:  8 visits.    PLANNED INTERVENTIONS: 97110-Therapeutic exercises, 97530- Therapeutic activity, [id number]- Neuromuscular re-education, 97535- Self Care, [id number]- Manual therapy, and Patient/Family education  PLAN FOR NEXT SESSION: STW/M, suboccipital release, postural exercises, chin  tucks and extension, also facilitated with towel.   Vedika Dumlao, PT 02/24/2024, 11:32 AM

## 2024-02-29 ENCOUNTER — Encounter: Payer: Self-pay | Admitting: *Deleted

## 2024-02-29 ENCOUNTER — Encounter: Admitting: Internal Medicine

## 2024-02-29 ENCOUNTER — Ambulatory Visit: Admitting: *Deleted

## 2024-02-29 DIAGNOSIS — M542 Cervicalgia: Secondary | ICD-10-CM | POA: Diagnosis not present

## 2024-02-29 DIAGNOSIS — M62838 Other muscle spasm: Secondary | ICD-10-CM

## 2024-02-29 NOTE — Therapy (Signed)
 OUTPATIENT PHYSICAL THERAPY CERVICAL TREATMENT   Patient Name: Daniel Carey MRN: [id number] DOB:[birth year], 73 y.o., male Today's Date: 02/29/2024  END OF SESSION:  PT End of Session - 02/29/24 1358     Visit Number 2    Number of Visits 8    Date for Recertification  03/23/24    PT Start Time 1300    PT Stop Time 1350    PT Time Calculation (min) 50 min          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    noted on EKG March 2013   CAD (coronary artery disease)    s/p atherectomy, PTCA/DES x2 mid & distal RCA 06/30/12   Carotid artery occlusion    Chest pain    Complication of anesthesia    heart rate dropping with propofol  last procedure   COPD (chronic obstructive pulmonary disease) (HCC)    Dyspnea    ED (erectile dysfunction)    Esophageal stricture    External hemorrhoids without mention of complication    GERD (gastroesophageal reflux disease)    Hiatal hernia    History of kidney stones    HLD (hyperlipidemia)    HTN (hypertension)    Myocardial infarction (HCC)    1996   Personal history of colonic polyps 03/12/2010   TUBULAR ADENOMA   Pneumonia    Presence of permanent cardiac pacemaker    Sleep apnea    wears CPAP   Tobacco abuse    Tubular adenoma of colon    Type 2 diabetes mellitus (HCC)    Past Surgical History:  Procedure Laterality Date   ANGIOPLASTY     X3   ANTERIOR CERVICAL DECOMP/DISCECTOMY FUSION N/A 11/25/2022   Procedure: Anterior Cervical Decompression/Discectomy Fusion Cervical Three-Four;  Surgeon: Debby Dorn MATSU, MD;  Location: [facility] OR;  Service: Neurosurgery;  Laterality: N/A;   CARDIAC CATHETERIZATION     8 years ago stents   COLONOSCOPY N/A 02/16/2017   Procedure: COLONOSCOPY;  Surgeon: Albertus Gordy HERO, MD;  Location: [facility];  Service: Gastroenterology;  Laterality: N/A;   COLONOSCOPY     CORONARY ARTERY BYPASS GRAFT N/A 07/19/2020   Procedure: CORONARY ARTERY BYPASS  GRAFTING (CABG) TIMES THREE USING LEFT INTERNAL MAMMARY ARTERY AND RIGHT GREATER SAPHENOUS VEIN HARVESTED ENDOSCOPICALLY;  Surgeon: Lucas Dorise POUR, MD;  Location: [facility] OR;  Service: Open Heart Surgery;  Laterality: N/A;   CORONARY BALLOON ANGIOPLASTY N/A 02/16/2024   Procedure: CORONARY BALLOON ANGIOPLASTY;  Surgeon: Anner Alm ORN, MD;  Location: [facility] INVASIVE CV LAB;  Service: Cardiovascular;  Laterality: N/A;   CORONARY IMAGING/OCT N/A 02/16/2024   Procedure: CORONARY IMAGING/OCT;  Surgeon: Anner Alm ORN, MD;  Location: [facility] INVASIVE CV LAB;  Service: Cardiovascular;  Laterality: N/A;   INSERT / REPLACE / REMOVE PACEMAKER  06/20/2018   LEFT HEART CATH AND CORONARY ANGIOGRAPHY N/A 11/05/2016   Procedure: Left Heart Cath and Coronary Angiography;  Surgeon: Mady Bruckner, MD;  Location: [facility] INVASIVE CV LAB;  Service: Cardiovascular;  Laterality: N/A;   LEFT HEART CATH AND CORONARY ANGIOGRAPHY N/A 07/12/2020   Procedure: LEFT HEART CATH AND CORONARY ANGIOGRAPHY;  Surgeon: Verlin Bruckner BIRCH, MD;  Location: [facility] INVASIVE CV LAB;  Service: Cardiovascular;  Laterality: N/A;   LEFT HEART CATH AND CORS/GRAFTS ANGIOGRAPHY N/A 02/16/2024   Procedure: LEFT HEART CATH AND CORS/GRAFTS ANGIOGRAPHY;  Surgeon: Anner Alm ORN, MD;  Location: [facility] INVASIVE CV LAB;  Service:  Cardiovascular;  Laterality: N/A;   LUMBAR LAMINECTOMY/DECOMPRESSION MICRODISCECTOMY N/A 05/12/2018   Procedure: Microlumbar decompression L2-3, possible L1-2;  Surgeon: Duwayne Purchase, MD;  Location: [facility] OR;  Service: Orthopedics;  Laterality: N/A;  2 hrs, Microlumbar decompression L2-3, possible L1-2   PACEMAKER IMPLANT N/A 06/20/2018   Procedure: PACEMAKER IMPLANT;  Surgeon: Waddell Danelle ORN, MD;  Location: [facility] INVASIVE CV LAB;  Service: Cardiovascular;  Laterality: N/A;   PERCUTANEOUS CORONARY ROTOBLATOR INTERVENTION (PCI-R) N/A 06/30/2012   Procedure: PERCUTANEOUS CORONARY ROTOBLATOR INTERVENTION (PCI-R);  Surgeon: Lonni JONETTA Cash,  MD;  Location: [facility] CATH LAB;  Service: Cardiovascular;  Laterality: N/A;   TEE WITHOUT CARDIOVERSION N/A 07/19/2020   Procedure: TRANSESOPHAGEAL ECHOCARDIOGRAM (TEE);  Surgeon: Lucas Dorise POUR, MD;  Location: [facility] OR;  Service: Open Heart Surgery;  Laterality: N/A;   VIDEO BRONCHOSCOPY  08/13/2011   Procedure: VIDEO BRONCHOSCOPY WITHOUT FLUORO;  Surgeon: Ozell KATHEE America, MD;  Location: [facility];  Service: Cardiopulmonary;  Laterality: Bilateral;   Patient Active Problem List   Diagnosis Date Noted   Cervical radiculopathy 12/22/2022   Carotid artery stenosis 06/12/2022   Pain of left hip joint 11/24/2021   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    THERAPY DIAG:  Cervicalgia  Other muscle spasm  Rationale for Evaluation and Treatment: Rehabilitation  ONSET DATE: Ongoing.    SUBJECTIVE:                                                                                                                                                                                                         SUBJECTIVE STATEMENT: The patient presents to the clinic with c/o left-sided neck pain and headaches. He underwent a ACDF (C3-4) in August of last year. LT side is tight/painful Did okay with eval'. Pain is on the LT side    PERTINENT HISTORY:  Pacemaker.    PAIN:  Are you having pain? Yes: NPRS scale: 5-6/10.  Pain location: Left cervical.   Pain description: As above.   Aggravating factors: As  above.   Relieving factors: As above.  PRECAUTIONS: ICD/Pacemaker  RED FLAGS: None     WEIGHT BEARING RESTRICTIONS: No  FALLS:  Has patient fallen in last 6 months? No  LIVING ENVIRONMENT: Lives with: lives with their spouse Lives in: House/apartment Has following equipment at home: None  PLOF: Independent  PATIENT GOALS: Not have headaches and neck pain.     OBJECTIVE:   PATIENT SURVEYS:  NDI:  13/50 (26%).   POSTURE: rounded shoulders  PALPATION: Tender to palpation over left upper cervical paraspinal musculature and UT with active trigger points noted.     CERVICAL ROM:   Right active cervical rotation is 60 degrees and left is 50 degrees.    UPPER EXTREMITY ROM:  WNL.  UPPER EXTREMITY MMT:  WNL.   TREATMENT DATE: 02/24/24: UBE x 6 mins 120 RPMs Chin tucks x10 hold 3-5 secs Cerv. Rotations  2x5 each side  ( pain on LT when rot. LT) Discussed postures for sitting and standing and using pillows when reading to allow neutral neck /head posture   STW/M and TPR x 23 minutes to cerv.paras as well as LT Utrap and Levator  while seated                                                                                                                   PATIENT EDUCATION:  Education details: Discussed correct posture. Person educated: Patient Education method: Medical Illustrator Education comprehension: verbalized understanding  HOME EXERCISE PROGRAM:   ASSESSMENT:  CLINICAL IMPRESSION: The patient presents to OPPT with c/o left-sided neck pain and headaches. Rx focused on light cervical and postural exs as well as STW and TPR to LT Utrap, pras, and Levator and did well. We also discussed good posture habits in sitting and standing. Increased LT side cervical rotation end of session.      OBJECTIVE IMPAIRMENTS: decreased ROM, increased muscle spasms, postural dysfunction, and pain.   ACTIVITY LIMITATIONS: sitting with head down.     PARTICIPATION LIMITATIONS: Golf.    PERSONAL FACTORS: Time since onset of injury/illness/exacerbation and 1 comorbidity: ACDF, pacemaker are also affecting patient's functional outcome.   REHAB POTENTIAL: Good  CLINICAL DECISION MAKING: Stable/uncomplicated  EVALUATION COMPLEXITY: Low   GOALS:  SHORT TERM GOALS: Target date: 03/23/24.  Ind with a HEP.  Goal status: INITIAL  2.  Increase active cervical rotation to 65 degrees+ so patient can turn head more easily while driving. Goal status: INITIAL  3.  No more than one headache per week with a decrease in intensity by 50%.  Goal status: INITIAL  4.  Improve NDI score by 3 points.    Goal status: INITIAL   PLAN:  PT FREQUENCY/DURATION:  8 visits.    PLANNED INTERVENTIONS: 97110-Therapeutic exercises, 97530- Therapeutic activity, [id number]- Neuromuscular re-education, 97535- Self Care, [id number]- Manual therapy, and Patient/Family education  PLAN FOR NEXT SESSION: STW/M, suboccipital release, postural exercises, chin tucks and extension, also facilitated with towel.   Geniva Lohnes,CHRIS, PTA 02/29/2024, 3:05 PM

## 2024-03-01 ENCOUNTER — Encounter: Payer: Self-pay | Admitting: Cardiology

## 2024-03-02 NOTE — Telephone Encounter (Signed)
 Pantroprazole is the recommended PPI for a patient on plavix - can we start pantoprazole  40 mg daily?

## 2024-03-03 ENCOUNTER — Other Ambulatory Visit: Payer: Self-pay | Admitting: Family Medicine

## 2024-03-06 ENCOUNTER — Ambulatory Visit: Admitting: Physical Therapy

## 2024-03-06 DIAGNOSIS — M542 Cervicalgia: Secondary | ICD-10-CM

## 2024-03-06 DIAGNOSIS — M62838 Other muscle spasm: Secondary | ICD-10-CM

## 2024-03-06 NOTE — Therapy (Signed)
 OUTPATIENT PHYSICAL THERAPY CERVICAL TREATMENT   Patient Name: Daniel Carey MRN: [id number] DOB:[birth year], 73 y.o., male Today's Date: 03/06/2024  END OF SESSION:  PT End of Session - 03/06/24 1155     Visit Number 3    Number of Visits 8    Date for Recertification  03/23/24    PT Start Time 1100    PT Stop Time 1137    PT Time Calculation (min) 37 min    Activity Tolerance Patient tolerated treatment well    Behavior During Therapy [facility] for tasks assessed/performed          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    noted on EKG March 2013   CAD (coronary artery disease)    s/p atherectomy, PTCA/DES x2 mid & distal RCA 06/30/12   Carotid artery occlusion    Chest pain    Complication of anesthesia    heart rate dropping with propofol  last procedure   COPD (chronic obstructive pulmonary disease) (HCC)    Dyspnea    ED (erectile dysfunction)    Esophageal stricture    External hemorrhoids without mention of complication    GERD (gastroesophageal reflux disease)    Hiatal hernia    History of kidney stones    HLD (hyperlipidemia)    HTN (hypertension)    Myocardial infarction (HCC)    1996   Personal history of colonic polyps 03/12/2010   TUBULAR ADENOMA   Pneumonia    Presence of permanent cardiac pacemaker    Sleep apnea    wears CPAP   Tobacco abuse    Tubular adenoma of colon    Type 2 diabetes mellitus (HCC)    Past Surgical History:  Procedure Laterality Date   ANGIOPLASTY     X3   ANTERIOR CERVICAL DECOMP/DISCECTOMY FUSION N/A 11/25/2022   Procedure: Anterior Cervical Decompression/Discectomy Fusion Cervical Three-Four;  Surgeon: Debby Dorn MATSU, MD;  Location: [facility] OR;  Service: Neurosurgery;  Laterality: N/A;   CARDIAC CATHETERIZATION     8 years ago stents   COLONOSCOPY N/A 02/16/2017   Procedure: COLONOSCOPY;  Surgeon: Albertus Gordy HERO, MD;  Location: [facility]COPY;  Service: Gastroenterology;   Laterality: N/A;   COLONOSCOPY     CORONARY ARTERY BYPASS GRAFT N/A 07/19/2020   Procedure: CORONARY ARTERY BYPASS GRAFTING (CABG) TIMES THREE USING LEFT INTERNAL MAMMARY ARTERY AND RIGHT GREATER SAPHENOUS VEIN HARVESTED ENDOSCOPICALLY;  Surgeon: Lucas Dorise POUR, MD;  Location: [facility] OR;  Service: Open Heart Surgery;  Laterality: N/A;   CORONARY BALLOON ANGIOPLASTY N/A 02/16/2024   Procedure: CORONARY BALLOON ANGIOPLASTY;  Surgeon: Anner Alm ORN, MD;  Location: [facility] INVASIVE CV LAB;  Service: Cardiovascular;  Laterality: N/A;   CORONARY IMAGING/OCT N/A 02/16/2024   Procedure: CORONARY IMAGING/OCT;  Surgeon: Anner Alm ORN, MD;  Location: [facility] INVASIVE CV LAB;  Service: Cardiovascular;  Laterality: N/A;   INSERT / REPLACE / REMOVE PACEMAKER  06/20/2018   LEFT HEART CATH AND CORONARY ANGIOGRAPHY N/A 11/05/2016   Procedure: Left Heart Cath and Coronary Angiography;  Surgeon: Mady Bruckner, MD;  Location: [facility] INVASIVE CV LAB;  Service: Cardiovascular;  Laterality: N/A;   LEFT HEART CATH AND CORONARY ANGIOGRAPHY N/A 07/12/2020   Procedure: LEFT HEART CATH AND CORONARY ANGIOGRAPHY;  Surgeon: Verlin Bruckner BIRCH, MD;  Location: [facility] INVASIVE CV LAB;  Service: Cardiovascular;  Laterality: N/A;   LEFT HEART CATH AND CORS/GRAFTS ANGIOGRAPHY N/A 02/16/2024   Procedure: LEFT  HEART CATH AND CORS/GRAFTS ANGIOGRAPHY;  Surgeon: Anner Alm ORN, MD;  Location: [facility] INVASIVE CV LAB;  Service: Cardiovascular;  Laterality: N/A;   LUMBAR LAMINECTOMY/DECOMPRESSION MICRODISCECTOMY N/A 05/12/2018   Procedure: Microlumbar decompression L2-3, possible L1-2;  Surgeon: Duwayne Purchase, MD;  Location: [facility] OR;  Service: Orthopedics;  Laterality: N/A;  2 hrs, Microlumbar decompression L2-3, possible L1-2   PACEMAKER IMPLANT N/A 06/20/2018   Procedure: PACEMAKER IMPLANT;  Surgeon: Waddell Danelle ORN, MD;  Location: [facility] INVASIVE CV LAB;  Service: Cardiovascular;  Laterality: N/A;   PERCUTANEOUS CORONARY ROTOBLATOR INTERVENTION (PCI-R)  N/A 06/30/2012   Procedure: PERCUTANEOUS CORONARY ROTOBLATOR INTERVENTION (PCI-R);  Surgeon: Lonni JONETTA Cash, MD;  Location: [facility] CATH LAB;  Service: Cardiovascular;  Laterality: N/A;   TEE WITHOUT CARDIOVERSION N/A 07/19/2020   Procedure: TRANSESOPHAGEAL ECHOCARDIOGRAM (TEE);  Surgeon: Lucas Dorise POUR, MD;  Location: [facility] OR;  Service: Open Heart Surgery;  Laterality: N/A;   VIDEO BRONCHOSCOPY  08/13/2011   Procedure: VIDEO BRONCHOSCOPY WITHOUT FLUORO;  Surgeon: Ozell KATHEE America, MD;  Location: [facility];  Service: Cardiopulmonary;  Laterality: Bilateral;   Patient Active Problem List   Diagnosis Date Noted   Cervical radiculopathy 12/22/2022   Carotid artery stenosis 06/12/2022   Pain of left hip joint 11/24/2021   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    THERAPY DIAG:  No diagnosis found.  Rationale for Evaluation and Treatment: Rehabilitation  ONSET DATE: Ongoing.    SUBJECTIVE:                                                                                                                                                                                                         SUBJECTIVE STATEMENT: Bad headache Saturday.  Eventually went away.  Did okay with eval'. Pain is on the LT side    PERTINENT HISTORY:  Pacemaker.    PAIN:  Are you having pain? Yes: NPRS scale: 5-6/10.  Pain location: Left cervical.   Pain description: As above.   Aggravating factors: As  above.   Relieving factors: As above.  PRECAUTIONS: ICD/Pacemaker  RED FLAGS: None     WEIGHT BEARING RESTRICTIONS: No  FALLS:  Has patient fallen in last 6 months? No  LIVING ENVIRONMENT: Lives with: lives with their spouse Lives in: House/apartment Has following equipment at home: None  PLOF: Independent  PATIENT GOALS: Not have headaches and neck pain.     OBJECTIVE:   PATIENT SURVEYS:  NDI:  13/50 (26%).   POSTURE: rounded shoulders  PALPATION: Tender to palpation over left upper cervical paraspinal musculature and UT with active trigger points noted.     CERVICAL ROM:   Right active cervical rotation is 60 degrees and left is 50 degrees.    UPPER EXTREMITY ROM:  WNL.  UPPER EXTREMITY MMT:  WNL.   TREATMENT DATE:   03/06/24:  STW/M x 23 minutes to patient's left cervical musculature f/b HMP x 9 minutes.     02/24/24: UBE x 6 mins 120 RPMs Chin tucks x10 hold 3-5 secs Cerv. Rotations  2x5 each side  ( pain on LT when rot. LT) Discussed postures for sitting and standing and using pillows when reading to allow neutral neck /head posture   STW/M and TPR x 23 minutes to cerv.paras as well as LT Utrap and Levator  while seated                                                                                                                   PATIENT EDUCATION:  Education details: Discussed correct posture. Person educated: Patient Education method: Medical Illustrator Education comprehension: verbalized understanding  HOME EXERCISE PROGRAM:   ASSESSMENT:  CLINICAL IMPRESSION: Patient with notable trigger points over his left cervical paraspinal musculature.  He did well with STW/M today.  He requested being placed on hold at this time.    OBJECTIVE IMPAIRMENTS: decreased ROM, increased muscle spasms, postural dysfunction, and pain.   ACTIVITY LIMITATIONS: sitting with head down.    PARTICIPATION LIMITATIONS: Golf.    PERSONAL  FACTORS: Time since onset of injury/illness/exacerbation and 1 comorbidity: ACDF, pacemaker are also affecting patient's functional outcome.   REHAB POTENTIAL: Good  CLINICAL DECISION MAKING: Stable/uncomplicated  EVALUATION COMPLEXITY: Low   GOALS:  SHORT TERM GOALS: Target date: 03/23/24.  Ind with a HEP.  Goal status: INITIAL  2.  Increase active cervical rotation to 65 degrees+ so patient can turn head more easily while driving. Goal status: INITIAL  3.  No more than one headache per week with a decrease in intensity by 50%.  Goal status: INITIAL  4.  Improve NDI score by 3 points.    Goal status: INITIAL   PLAN:  PT FREQUENCY/DURATION:  8 visits.    PLANNED INTERVENTIONS: 97110-Therapeutic exercises, 97530- Therapeutic activity, [id number]- Neuromuscular re-education, 97535- Self Care, [id number]- Manual therapy, and Patient/Family education  PLAN FOR NEXT SESSION: STW/M, suboccipital release, postural exercises, chin tucks and extension, also facilitated with towel.   Lavalle Skoda, PT 03/06/2024, 11:56 AM

## 2024-03-09 ENCOUNTER — Telehealth: Payer: Self-pay | Admitting: Gastroenterology

## 2024-03-09 NOTE — Telephone Encounter (Signed)
 Dr Bridgett Camps pt

## 2024-03-09 NOTE — Telephone Encounter (Signed)
 PT wife is calling to report that PT still has diarrhea issues. Please advise.

## 2024-03-09 NOTE — Telephone Encounter (Signed)
 Pt wanted to schedule colon, discussed this cannot be done until cardiology states he can come off of the plavix . Pt reports he is still having diarrhea and abdominal cramping. He reports he stopped his plavix  2 days ago to see if this would help. He has an appt with cardiology Tuesday and reports he will discuss coming off plavix  to have colon at the visit. He and his wife think plavix , zetia , and metoprolol  are causing diarrhea. Note sent to cardiology as an FYI on Plavix .

## 2024-03-10 NOTE — Telephone Encounter (Addendum)
 Needs uninterrupted aspirin  plus plavix  for 6 months given recent PCI, can hold plavix  after 6 months.  He needs to restart his plavix   ADDENDUM: I spoke with patient and his wife, he confirms he has restarted plavix .  Since patient underwent balloon angioplasty but no stent was placed, if needs to hold plavix  for colonoscopy that is OK.  He should continue ASA 81 mg daily while holding plavix 

## 2024-03-13 NOTE — Progress Notes (Unsigned)
 Cardiology Office Note:    Date:  03/14/2024   ID:  Daniel Carey, DOB [birth year], MRN [id number]  PCP:  Kennyth Worth HERO, MD  Cardiologist:  Lonni LITTIE Nanas, MD  Electrophysiologist:  Danelle Birmingham, MD   Referring MD: Kennyth Worth HERO, MD   Chief Complaint  Patient presents with   Coronary Artery Disease    History of Present Illness:    Daniel Carey is a 73 y.o. male with a hx of CAD status post CABG, CHB status post PPM, COPD, hypertension, hyperlipidemia, OSA who presents for follow-up.  Previously followed with Dr. Alveta.  Cath 06/2020 showed severe multivessel disease.  Echocardiogram 06/2020 showed EF 55 to 60%.  Underwent CABG on 07/19/2020 with LIMA-LAD, SVG-OM 3, SVG-PDA.  He reported chest pain and underwent echocardiogram 12/29/2023 which showed normal biventricular function, moderate pulm monic regurgitation.  Stress PET 02/08/2024 showed severe ischemia in LCx territory, normal global myocardial blood flow reserve but decreased in LCx territory (1.62), drop in LVEF with stress, overall high risk study.  LHC 02/16/2024 showed patent LIMA-LAD, patent SVG-OM 3, occluded SVG-PDA; 70% ISR in mid RCA followed by 95% ISR in distal RCA, treated with PTCA  Since last clinic visit, he reports he is doing okay.  Reports his exertional chest pain has resolved since his PCI last month.  Did have some chest pain after eating too much over the weekend but otherwise denies any chest pain.  Main issue now is diarrhea and he is planning colonoscopy.  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    noted on EKG March 2013   CAD (coronary artery disease)    s/p atherectomy, PTCA/DES x2 mid & distal RCA 06/30/12   Carotid artery occlusion    Chest pain    Complication of anesthesia    heart rate dropping with propofol  last procedure   COPD (chronic obstructive pulmonary disease) (HCC)    Dyspnea    ED (erectile dysfunction)    Esophageal  stricture    External hemorrhoids without mention of complication    GERD (gastroesophageal reflux disease)    Hiatal hernia    History of kidney stones    HLD (hyperlipidemia)    HTN (hypertension)    Myocardial infarction (HCC)    1996   Personal history of colonic polyps 03/12/2010   TUBULAR ADENOMA   Pneumonia    Presence of permanent cardiac pacemaker    Sleep apnea    wears CPAP   Tobacco abuse    Tubular adenoma of colon    Type 2 diabetes mellitus (HCC)     Past Surgical History:  Procedure Laterality Date   ANGIOPLASTY     X3   ANTERIOR CERVICAL DECOMP/DISCECTOMY FUSION N/A 11/25/2022   Procedure: Anterior Cervical Decompression/Discectomy Fusion Cervical Three-Four;  Surgeon: Debby Dorn MATSU, MD;  Location: [facility] OR;  Service: Neurosurgery;  Laterality: N/A;   CARDIAC CATHETERIZATION     8 years ago stents   COLONOSCOPY N/A 02/16/2017   Procedure: COLONOSCOPY;  Surgeon: Albertus Gordy HERO, MD;  Location: [facility]COPY;  Service: Gastroenterology;  Laterality: N/A;   COLONOSCOPY     CORONARY ARTERY BYPASS GRAFT N/A 07/19/2020   Procedure: CORONARY ARTERY BYPASS GRAFTING (CABG) TIMES THREE USING LEFT INTERNAL MAMMARY ARTERY AND RIGHT GREATER SAPHENOUS VEIN HARVESTED ENDOSCOPICALLY;  Surgeon: Lucas Dorise POUR, MD;  Location: [facility] OR;  Service: Open Heart Surgery;  Laterality: N/A;  CORONARY BALLOON ANGIOPLASTY N/A 02/16/2024   Procedure: CORONARY BALLOON ANGIOPLASTY;  Surgeon: Anner Alm ORN, MD;  Location: [facility] INVASIVE CV LAB;  Service: Cardiovascular;  Laterality: N/A;   CORONARY IMAGING/OCT N/A 02/16/2024   Procedure: CORONARY IMAGING/OCT;  Surgeon: Anner Alm ORN, MD;  Location: [facility] INVASIVE CV LAB;  Service: Cardiovascular;  Laterality: N/A;   INSERT / REPLACE / REMOVE PACEMAKER  06/20/2018   LEFT HEART CATH AND CORONARY ANGIOGRAPHY N/A 11/05/2016   Procedure: Left Heart Cath and Coronary Angiography;  Surgeon: Mady Bruckner, MD;  Location: [facility] INVASIVE CV LAB;  Service:  Cardiovascular;  Laterality: N/A;   LEFT HEART CATH AND CORONARY ANGIOGRAPHY N/A 07/12/2020   Procedure: LEFT HEART CATH AND CORONARY ANGIOGRAPHY;  Surgeon: Verlin Bruckner BIRCH, MD;  Location: [facility] INVASIVE CV LAB;  Service: Cardiovascular;  Laterality: N/A;   LEFT HEART CATH AND CORS/GRAFTS ANGIOGRAPHY N/A 02/16/2024   Procedure: LEFT HEART CATH AND CORS/GRAFTS ANGIOGRAPHY;  Surgeon: Anner Alm ORN, MD;  Location: [facility] INVASIVE CV LAB;  Service: Cardiovascular;  Laterality: N/A;   LUMBAR LAMINECTOMY/DECOMPRESSION MICRODISCECTOMY N/A 05/12/2018   Procedure: Microlumbar decompression L2-3, possible L1-2;  Surgeon: Duwayne Purchase, MD;  Location: [facility] OR;  Service: Orthopedics;  Laterality: N/A;  2 hrs, Microlumbar decompression L2-3, possible L1-2   PACEMAKER IMPLANT N/A 06/20/2018   Procedure: PACEMAKER IMPLANT;  Surgeon: Waddell Danelle ORN, MD;  Location: [facility] INVASIVE CV LAB;  Service: Cardiovascular;  Laterality: N/A;   PERCUTANEOUS CORONARY ROTOBLATOR INTERVENTION (PCI-R) N/A 06/30/2012   Procedure: PERCUTANEOUS CORONARY ROTOBLATOR INTERVENTION (PCI-R);  Surgeon: Bruckner BIRCH Verlin, MD;  Location: [facility] CATH LAB;  Service: Cardiovascular;  Laterality: N/A;   TEE WITHOUT CARDIOVERSION N/A 07/19/2020   Procedure: TRANSESOPHAGEAL ECHOCARDIOGRAM (TEE);  Surgeon: Lucas Dorise POUR, MD;  Location: [facility] OR;  Service: Open Heart Surgery;  Laterality: N/A;   VIDEO BRONCHOSCOPY  08/13/2011   Procedure: VIDEO BRONCHOSCOPY WITHOUT FLUORO;  Surgeon: Ozell KATHEE America, MD;  Location: [facility];  Service: Cardiopulmonary;  Laterality: Bilateral;    Current Medications: Current Meds  Medication Sig   Accu-Chek Softclix Lancets lancets Use as instructed 1x a day   acetaminophen  (TYLENOL ) 500 MG tablet Take 1,000 mg by mouth every 6 (six) hours as needed (pain).    albuterol  (VENTOLIN  HFA) 108 (90 Base) MCG/ACT inhaler INHALE 2 PUFFS INTO THE LUNGS EVERY 6 HOURS AS NEEDED FOR WHEEZE OR SHORTNESS OF BREATH   aspirin   EC 81 MG tablet Take 1 tablet (81 mg total) by mouth daily. Swallow whole.   Blood Glucose Monitoring Suppl (ACCU-CHEK GUIDE) w/Device KIT Use as advised   clopidogrel  (PLAVIX ) 75 MG tablet Take 1 tablet (75 mg total) by mouth daily with breakfast.   ezetimibe  (ZETIA ) 10 MG tablet Take 1 tablet (10 mg total) by mouth daily.   famotidine  (PEPCID ) 20 MG tablet Take 20 mg by mouth daily as needed for heartburn.   fenofibrate  (TRICOR ) 145 MG tablet TAKE 1 TABLET BY MOUTH EVERY DAY   glucose blood test strip Use as instructed 1x a day   hydrochlorothiazide  (HYDRODIURIL ) 25 MG tablet TAKE 1/2 TABLET (12.5 MG TOTAL) BY MOUTH DAILY AS NEEDED.   ipratropium (ATROVENT ) 0.06 % nasal spray PLACE 2 SPRAYS INTO BOTH NOSTRILS 4 (FOUR) TIMES DAILY AS DIRECTED (Patient taking differently: Place 2 sprays into both nostrils daily.)   metoprolol  tartrate (LOPRESSOR ) 25 MG tablet Take 0.5 tablets (12.5 mg total) by mouth 2 (two) times daily.   Na Sulfate-K Sulfate-Mg Sulfate concentrate (SUPREP) 17.5-3.13-1.6 GM/177ML SOLN TAKE 1 KIT (  354 MLS TOTAL) BY MOUTH ONCE FOR 1 DOSE.   nitroGLYCERIN  (NITROSTAT ) 0.4 MG SL tablet Place 1 tablet (0.4 mg total) under the tongue every 5 (five) minutes as needed for chest pain.   pantoprazole  (PROTONIX ) 40 MG tablet Take 1 tablet (40 mg total) by mouth daily.   Pharmacist Choice Lancets MISC Use to check blood sugar 1-2 times a day   Propylene Glycol (SYSTANE COMPLETE) 0.6 % SOLN Place 1 drop into both eyes daily.   sildenafil  (VIAGRA ) 100 MG tablet Take 1 tablet by mouth prior to sexual activity as needed   [DISCONTINUED] metoprolol  succinate (TOPROL  XL) 25 MG 24 hr tablet Take 1 tablet (25 mg total) by mouth daily.   [DISCONTINUED] rosuvastatin  (CRESTOR ) 20 MG tablet TAKE 1 TABLET BY MOUTH EVERY DAY     Allergies:   Amlodipine besylate, Propofol , Lisinopril , and Rosuvastatin    Social History   Socioeconomic History   Marital status: Married    Spouse name: Not on file    Number of children: 1   Years of education: Not on file   Highest education level: GED or equivalent  Occupational History   Occupation: Regulatory Affairs Officer  Tobacco Use   Smoking status: Former    Current packs/day: 0.00    Average packs/day: 0.5 packs/day for 40.0 years (20.0 ttl pk-yrs)    Types: Cigarettes    Start date: 08/25/1971    Quit date: 08/25/2011    Years since quitting: 12.5   Smokeless tobacco: Never  Vaping Use   Vaping status: Never Used  Substance and Sexual Activity   Alcohol use: Not Currently   Drug use: No   Sexual activity: Not Currently  Other Topics Concern   Not on file  Social History Narrative   Not on file   Social Drivers of Health   Financial Resource Strain: Low Risk  (12/27/2023)   Overall Financial Resource Strain (CARDIA)    Difficulty of Paying Living Expenses: Not very hard  Food Insecurity: Patient Declined (12/27/2023)   Hunger Vital Sign    Worried About Running Out of Food in the Last Year: Patient declined    Ran Out of Food in the Last Year: Patient declined  Transportation Needs: No Transportation Needs (12/27/2023)   PRAPARE - Administrator, Civil Service (Medical): No    Lack of Transportation (Non-Medical): No  Physical Activity: Sufficiently Active (12/27/2023)   Exercise Vital Sign    Days of Exercise per Week: 4 days    Minutes of Exercise per Session: 40 min  Stress: No Stress Concern Present (12/27/2023)   [employer] of Occupational Health - Occupational Stress Questionnaire    Feeling of Stress: Not at all  Social Connections: Moderately Isolated (12/27/2023)   Social Connection and Isolation Panel    Frequency of Communication with Friends and Family: Three times a week    Frequency of Social Gatherings with Friends and Family: Once a week    Attends Religious Services: Patient declined    Database Administrator or Organizations: No    Attends Engineer, Structural: Not on file    Marital Status:  Married     Family History: The patient's family history includes Breast cancer in his sister; Diabetes in his mother; Heart disease in his father; Kidney failure in his mother. There is no history of Colon cancer, Stomach cancer, Esophageal cancer, or Rectal cancer.  ROS:   Please see the history of present illness.  All other systems reviewed and are negative.  EKGs/Labs/Other Studies Reviewed:    The following studies were reviewed today:   EKG:   11/23/2023: Atrial sensed, ventricular paced rhythm, rate 66  Recent Labs: 12/30/2023: ALT 19; TSH 3.69 02/10/2024: BUN 16; Creatinine, Ser 1.22; Hemoglobin 15.0; Platelets 193; Potassium 4.9; Sodium 137  Recent Lipid Panel    Component Value Date/Time   CHOL 116 12/30/2023 1439   CHOL 163 11/15/2023 0815   TRIG 150.0 (H) 12/30/2023 1439   HDL 40.20 12/30/2023 1439   HDL 54 11/15/2023 0815   CHOLHDL 3 12/30/2023 1439   VLDL 30.0 12/30/2023 1439   LDLCALC 46 12/30/2023 1439   LDLCALC 88 11/15/2023 0815   LDLDIRECT 107.0 02/27/2013 0904    Physical Exam:    VS:  BP (!) 140/76   Pulse 73   Ht 5' 9 (1.753 m)   Wt 187 lb 3.2 oz (84.9 kg)   SpO2 97%   BMI 27.64 kg/m     Wt Readings from Last 3 Encounters:  03/14/24 187 lb 3.2 oz (84.9 kg)  02/16/24 184 lb (83.5 kg)  02/10/24 183 lb (83 kg)     GEN:  Well nourished, well developed in no acute distress HEENT: Normal NECK: No JVD; No carotid bruits LYMPHATICS: No lymphadenopathy CARDIAC: RRR, no murmurs, rubs, gallops RESPIRATORY:  Clear to auscultation without rales, wheezing or rhonchi  ABDOMEN: Soft, non-tender, non-distended MUSCULOSKELETAL:  No edema; No deformity  SKIN: Warm and dry NEUROLOGIC:  Alert and oriented x 3 PSYCHIATRIC:  Normal affect   ASSESSMENT:    1. Pre-op evaluation   2. Coronary artery disease of native artery of native heart with stable angina pectoris   3. Essential hypertension   4. Mixed hyperlipidemia      PLAN:    Preop  evaluation: Prior to colonoscopy.  Low risk procedure.  Denies any anginal symptoms since his recent PCI.  Given no stent was placed, okay to hold Plavix  for procedure.  Would continue aspirin  81 mg daily periprocedurally.  CAD: Cath 06/2020 showed severe multivessel disease.  Echocardiogram 06/2020 showed EF 55 to 60%.  Underwent CABG on 07/19/2020 with LIMA-LAD, SVG-OM 3, SVG-PDA.  He reported symptoms concerning for typical angina and underwent stress PET 02/08/2024 which showed severe ischemia in LCx territory, normal global myocardial blood flow reserve but decreased in LCx territory (1.62), drop in LVEF with stress, overall high risk study.  LHC 02/16/2024 showed patent LIMA-LAD, patent SVG-OM 3, occluded SVG-PDA; 70% ISR in mid RCA followed by 95% ISR in distal RCA, treated with PTCA - Continue aspirin  81 mg daily and plavix  75 mg daily. - Continue rosuvastatin  20 mg daily and Zetia  10 mg daily - Continue metoprolol , reports felt better on Lopressor  than Toprol -XL.  Will switch back to Lopressor  12.5 mg twice daily  CHB status post PPM.  Recent device interrogation showed 99% RV pacing.  Normal LV/RV function on echo 12/2023  Hypertension: Continue hydrochlorothiazide  12.5 mg daily and metoprolol  12.5 mg twice daily  Pulmonary regurgitation: Moderate on echocardiogram 12/2023.  Will monitor  Hyperlipidemia: LDL 88 on 11/15/2023, on rosuvastatin  20 mg daily.  He had stopped taking Zetia  due to fatigue.  Unclear if this was coming from Zetia .  Restarted Zetia  at clinic visit 11/2023.  LDL 46 on 12/30/23  T2DM: A1c 7.5%, follows with endocrinology  OSA: On CPAP, reports compliance  RTC in 4 months     Medication Adjustments/Labs and Tests Ordered: Current medicines are reviewed at length  with the patient today.  Concerns regarding medicines are outlined above.  No orders of the defined types were placed in this encounter.  Meds ordered this encounter  Medications   rosuvastatin  (CRESTOR ) 20  MG tablet    Sig: Take 1 tablet (20 mg total) by mouth daily.    Dispense:  90 tablet    Refill:  3   metoprolol  tartrate (LOPRESSOR ) 25 MG tablet    Sig: Take 0.5 tablets (12.5 mg total) by mouth 2 (two) times daily.    Dispense:  90 tablet    Refill:  3    Patient Instructions  Medication Instructions:  Stop: Metoprolol  succinate (Toprol -XL)  Start: Metoprolol  tartrate (Lopressor ) 12.5 mg (half a tablet), two times daily   Lab Work: None  Follow-Up: At [facility], you and your health needs are our priority.  As part of our continuing mission to provide you with exceptional heart care, our providers are all part of one team.  This team includes your primary Cardiologist (physician) and Advanced Practice Providers or APPs (Physician Assistants and Nurse Practitioners) who all work together to provide you with the care you need, when you need it.  Your next appointment:   4 month(s) (Due July 12, 2024)  Provider:   Lonni LITTIE Nanas, MD           Signed, Lonni LITTIE Nanas, MD  03/14/2024 5:04 PM    [facility]

## 2024-03-14 ENCOUNTER — Telehealth: Payer: Self-pay | Admitting: Gastroenterology

## 2024-03-14 ENCOUNTER — Ambulatory Visit: Attending: Cardiology | Admitting: Cardiology

## 2024-03-14 ENCOUNTER — Telehealth: Payer: Self-pay

## 2024-03-14 ENCOUNTER — Encounter: Payer: Self-pay | Admitting: Cardiology

## 2024-03-14 VITALS — BP 140/76 | HR 73 | Ht 69.0 in | Wt 187.2 lb

## 2024-03-14 DIAGNOSIS — Z01818 Encounter for other preprocedural examination: Secondary | ICD-10-CM | POA: Diagnosis not present

## 2024-03-14 DIAGNOSIS — E782 Mixed hyperlipidemia: Secondary | ICD-10-CM

## 2024-03-14 DIAGNOSIS — I10 Essential (primary) hypertension: Secondary | ICD-10-CM

## 2024-03-14 DIAGNOSIS — I25118 Atherosclerotic heart disease of native coronary artery with other forms of angina pectoris: Secondary | ICD-10-CM

## 2024-03-14 MED ORDER — METOPROLOL TARTRATE 25 MG PO TABS: 12.5000 mg | ORAL_TABLET | Freq: Two times a day (BID) | ORAL | 3 refills | Status: AC

## 2024-03-14 MED ORDER — ROSUVASTATIN CALCIUM 20 MG PO TABS: 20.0000 mg | ORAL_TABLET | Freq: Every day | ORAL | 3 refills | Status: AC

## 2024-03-14 NOTE — Telephone Encounter (Addendum)
 Wrong patient

## 2024-03-14 NOTE — Patient Instructions (Signed)
 Medication Instructions:  Stop: Metoprolol  succinate (Toprol -XL)  Start: Metoprolol  tartrate (Lopressor ) 12.5 mg (half a tablet), two times daily   Lab Work: None  Follow-Up: At [facility], you and your health needs are our priority.  As part of our continuing mission to provide you with exceptional heart care, our providers are all part of one team.  This team includes your primary Cardiologist (physician) and Advanced Practice Providers or APPs (Physician Assistants and Nurse Practitioners) who all work together to provide you with the care you need, when you need it.  Your next appointment:   4 month(s) (Due July 12, 2024)  Provider:   Lonni LITTIE Nanas, MD

## 2024-03-14 NOTE — Telephone Encounter (Signed)
 Inbound call from patient wife requesting that we reach out to her husband in order to schedule a colonoscopy. Patient was stated that they were advised to schedule now per patient cardiologist. Please advise.

## 2024-03-14 NOTE — Telephone Encounter (Signed)
 Pt was seen by cardiology and per note pt ok to schedule colon and hold plavix . Please advise if pt can be a direct colon.

## 2024-03-15 ENCOUNTER — Telehealth: Payer: Self-pay

## 2024-03-15 DIAGNOSIS — M5412 Radiculopathy, cervical region: Secondary | ICD-10-CM

## 2024-03-15 DIAGNOSIS — I459 Conduction disorder, unspecified: Secondary | ICD-10-CM

## 2024-03-15 DIAGNOSIS — G4733 Obstructive sleep apnea (adult) (pediatric): Secondary | ICD-10-CM

## 2024-03-15 DIAGNOSIS — Z950 Presence of cardiac pacemaker: Secondary | ICD-10-CM

## 2024-03-15 DIAGNOSIS — I6529 Occlusion and stenosis of unspecified carotid artery: Secondary | ICD-10-CM

## 2024-03-15 DIAGNOSIS — M48061 Spinal stenosis, lumbar region without neurogenic claudication: Secondary | ICD-10-CM

## 2024-03-15 DIAGNOSIS — Z951 Presence of aortocoronary bypass graft: Secondary | ICD-10-CM

## 2024-03-15 DIAGNOSIS — R739 Hyperglycemia, unspecified: Secondary | ICD-10-CM

## 2024-03-15 DIAGNOSIS — M25552 Pain in left hip: Secondary | ICD-10-CM

## 2024-03-15 DIAGNOSIS — I2510 Atherosclerotic heart disease of native coronary artery without angina pectoris: Secondary | ICD-10-CM

## 2024-03-15 DIAGNOSIS — E1169 Type 2 diabetes mellitus with other specified complication: Secondary | ICD-10-CM

## 2024-03-15 NOTE — Telephone Encounter (Signed)
 Pt scheduled for previsit 04/06/24 at 8am, colon scheduled in the lec with dr pyrtle 04/28/24 at 3pm. Left message for pt on voicemail, requested he call back to confirm he got the message.

## 2024-03-15 NOTE — Telephone Encounter (Signed)
 Copied from CRM #[id number]. Topic: Referral - Status >> Mar 14, 2024  4:39 PM Viola F wrote: Reason for CRM: Patient spouse Elveria called to follow up on a note that was dropped off to the office with a listed of referrals that patient needs entered due to his insurance. She wants to know if he received the note because none of the referrals are in. Please call her at [phone] [facility])

## 2024-03-15 NOTE — Telephone Encounter (Signed)
 Patient confirmed

## 2024-03-20 NOTE — Telephone Encounter (Signed)
 Ok with me. Please place any necessary orders.

## 2024-03-20 NOTE — Telephone Encounter (Signed)
 All 6 referrals placed. Ignore paperwork if it appears, already taken care of.

## 2024-03-21 NOTE — Telephone Encounter (Signed)
Noted on PV chart ?

## 2024-03-21 NOTE — Telephone Encounter (Signed)
 Is there a specified duration for this hold-how many days is the PLAVIX  needing to be held? Please advise Thank you Bre, PV RN

## 2024-03-21 NOTE — Telephone Encounter (Signed)
 Yes, 5 day plavix  hold for upcoming procedure.  Thank you for checking!

## 2024-03-21 NOTE — Telephone Encounter (Signed)
 Per 03/14/25 cardiology office note,    PLAN:     Preop evaluation: Prior to colonoscopy.  Low risk procedure.  Denies any anginal symptoms since his recent PCI.  Given no stent was placed, okay to hold Plavix  for procedure.  Would continue aspirin  81 mg daily periprocedurally.    Signed, Lonni LITTIE Nanas, MD  03/14/2024 5:04 PM    [facility]

## 2024-03-21 NOTE — Telephone Encounter (Signed)
 Rock, Just checking behind on all of these messages and I don't see where the PLAVIX  hold has been confirmed and for how long.  Can we please get this information prior to his PV scheduled in [facility] on 12/182025.   Please/thank you Bre, PV RN

## 2024-03-28 ENCOUNTER — Encounter (HOSPITAL_COMMUNITY)
Admission: RE | Admit: 2024-03-28 | Discharge: 2024-03-28 | Disposition: A | Source: Ambulatory Visit | Attending: Cardiology

## 2024-03-28 ENCOUNTER — Encounter (HOSPITAL_COMMUNITY): Payer: Self-pay

## 2024-03-28 DIAGNOSIS — Z9861 Coronary angioplasty status: Secondary | ICD-10-CM | POA: Insufficient documentation

## 2024-03-28 NOTE — Progress Notes (Signed)
 Completed virtual orientation today.  EP evaluation is scheduled for 04/03/24 at 0830 .  Documentation for diagnosis can be found in [facility] encounter 02/16/24.

## 2024-03-29 ENCOUNTER — Ambulatory Visit: Attending: Internal Medicine | Admitting: Internal Medicine

## 2024-03-29 ENCOUNTER — Encounter: Payer: Self-pay | Admitting: Internal Medicine

## 2024-03-29 VITALS — BP 148/98 | HR 66 | Ht 70.0 in | Wt 189.2 lb

## 2024-03-29 DIAGNOSIS — I25118 Atherosclerotic heart disease of native coronary artery with other forms of angina pectoris: Secondary | ICD-10-CM

## 2024-03-29 DIAGNOSIS — E782 Mixed hyperlipidemia: Secondary | ICD-10-CM

## 2024-03-29 DIAGNOSIS — I6523 Occlusion and stenosis of bilateral carotid arteries: Secondary | ICD-10-CM

## 2024-03-29 DIAGNOSIS — I371 Nonrheumatic pulmonary valve insufficiency: Secondary | ICD-10-CM | POA: Diagnosis not present

## 2024-03-29 DIAGNOSIS — I10 Essential (primary) hypertension: Secondary | ICD-10-CM

## 2024-03-29 DIAGNOSIS — Z950 Presence of cardiac pacemaker: Secondary | ICD-10-CM

## 2024-03-29 DIAGNOSIS — I459 Conduction disorder, unspecified: Secondary | ICD-10-CM | POA: Diagnosis not present

## 2024-03-29 NOTE — Patient Instructions (Addendum)
 Medication Instructions:  Your physician recommends that you continue on your current medications as directed. Please refer to the Current Medication list given to you today.  *If you need a refill on your cardiac medications before your next appointment, please call your pharmacy*  Lab Work: None ordered.  You may go to any [facility] Location for your lab work:  [facility] - [address] ([facility]) - [address] [phone] [address]  [place] - [address]   [place]  - [address]   [place] - [address] - [address] [employer]  - [address] [place] [address] ([facility]   If you have labs (blood work) drawn today and your tests are completely normal, you will receive your results only by: [employer] (if you have MyChart)  If you have any lab test that is abnormal or we need to change your treatment, we will call you or send a MyChart message to review the results.  Testing/Procedures: None ordered.  Follow-Up: At [facility], you and your health needs are our priority.  As part of our continuing mission to provide you with exceptional heart care, we have created designated Provider Care Teams.  These Care Teams include your primary Cardiologist (physician) and Advanced Practice Providers (APPs -  Physician Assistants and Nurse Practitioners) who all work together to provide you with the care you need, when you need it.  Your next appointment:   1 year(s)  The format for your next appointment:   In Person  Provider:   Donnice Primus, MD or one of the following Advanced Practice Providers on your designated Care Team:   Daniel Carey, [place] Daniel Carey, [place] Daniel Barrack, NP  Note: Remote monitoring is used to monitor your Pacemaker/ ICD from home. This monitoring reduces the number of office visits required to check  your device to one time per year. It allows us  to keep an eye on the functioning of your device to ensure it is working properly.

## 2024-03-29 NOTE — Progress Notes (Signed)
 HPI Daniel Carey returns today for followup of CHB, s/p PPM insertion, HTN, and CAD. He has had mild non-exertional chest pressure. He has undergone CABG about 30 months ago . He notes that his bp is normal at home. Because of recurrent chest pressure and an abnormal PET scan, he underwent repeat left heart cath which showed an occluded SVG and underwent PCI of an old stent. He still has non-exertional chest pain.  Allergies  Allergen Reactions   Amlodipine Besylate Other (See Comments)    Headache, weakness, dizziness   Propofol  Other (See Comments)    Heart rate dropped     Lisinopril  Cough and Rash   Rosuvastatin  Other (See Comments)     Corncern for muscle aches but he is tolerating now      Current Outpatient Medications  Medication Sig Dispense Refill   Accu-Chek Softclix Lancets lancets Use as instructed 1x a day 100 each 3   acetaminophen  (TYLENOL ) 500 MG tablet Take 1,000 mg by mouth every 6 (six) hours as needed (pain).      albuterol  (VENTOLIN  HFA) 108 (90 Base) MCG/ACT inhaler INHALE 2 PUFFS INTO THE LUNGS EVERY 6 HOURS AS NEEDED FOR WHEEZE OR SHORTNESS OF BREATH 8.5 each 3   aspirin  EC 81 MG tablet Take 1 tablet (81 mg total) by mouth daily. Swallow whole.     Blood Glucose Monitoring Suppl (ACCU-CHEK GUIDE) w/Device KIT Use as advised 1 kit 0   clopidogrel  (PLAVIX ) 75 MG tablet Take 1 tablet (75 mg total) by mouth daily with breakfast. 90 tablet 2   ezetimibe  (ZETIA ) 10 MG tablet Take 1 tablet (10 mg total) by mouth daily. 90 tablet 3   famotidine  (PEPCID ) 20 MG tablet Take 20 mg by mouth daily as needed for heartburn.     fenofibrate  (TRICOR ) 145 MG tablet TAKE 1 TABLET BY MOUTH EVERY DAY 90 tablet 3   glucose blood test strip Use as instructed 1x a day 100 each 3   hydrochlorothiazide  (HYDRODIURIL ) 25 MG tablet TAKE 1/2 TABLET (12.5 MG TOTAL) BY MOUTH DAILY AS NEEDED. 45 tablet 0   ipratropium (ATROVENT ) 0.06 % nasal spray PLACE 2 SPRAYS INTO BOTH NOSTRILS 4  (FOUR) TIMES DAILY AS DIRECTED 90 mL 1   metoprolol  tartrate (LOPRESSOR ) 25 MG tablet Take 0.5 tablets (12.5 mg total) by mouth 2 (two) times daily. 90 tablet 3   Na Sulfate-K Sulfate-Mg Sulfate concentrate (SUPREP) 17.5-3.13-1.6 GM/177ML SOLN TAKE 1 KIT (354 MLS TOTAL) BY MOUTH ONCE FOR 1 DOSE.     nitroGLYCERIN  (NITROSTAT ) 0.4 MG SL tablet Place 1 tablet (0.4 mg total) under the tongue every 5 (five) minutes as needed for chest pain. 25 tablet 3   pantoprazole  (PROTONIX ) 40 MG tablet Take 1 tablet (40 mg total) by mouth daily. 30 tablet 6   Pharmacist Choice Lancets MISC Use to check blood sugar 1-2 times a day 200 each 5   Propylene Glycol (SYSTANE COMPLETE) 0.6 % SOLN Place 1 drop into both eyes daily.     rosuvastatin  (CRESTOR ) 20 MG tablet Take 1 tablet (20 mg total) by mouth daily. 90 tablet 3   sildenafil  (VIAGRA ) 100 MG tablet Take 1 tablet by mouth prior to sexual activity as needed 15 tablet 11   No current facility-administered medications for this visit.     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    noted on EKG March 2013  CAD (coronary artery disease)    s/p atherectomy, PTCA/DES x2 mid & distal RCA 06/30/12   Carotid artery occlusion    Chest pain    Complication of anesthesia    heart rate dropping with propofol  last procedure   COPD (chronic obstructive pulmonary disease) (HCC)    Dyspnea    ED (erectile dysfunction)    Esophageal stricture    External hemorrhoids without mention of complication    GERD (gastroesophageal reflux disease)    Hiatal hernia    History of kidney stones    HLD (hyperlipidemia)    HTN (hypertension)    Myocardial infarction (HCC)    1996   Personal history of colonic polyps 03/12/2010   TUBULAR ADENOMA   Pneumonia    Presence of permanent cardiac pacemaker    Sleep apnea    wears CPAP   Tobacco abuse    Tubular adenoma of colon    Type 2 diabetes mellitus (HCC)     ROS:   All  systems reviewed and negative except as noted in the HPI.   Past Surgical History:  Procedure Laterality Date   ANGIOPLASTY     X3   ANTERIOR CERVICAL DECOMP/DISCECTOMY FUSION N/A 11/25/2022   Procedure: Anterior Cervical Decompression/Discectomy Fusion Cervical Three-Four;  Surgeon: Debby Dorn MATSU, MD;  Location: [facility];  Service: Neurosurgery;  Laterality: N/A;   CARDIAC CATHETERIZATION     8 years ago stents   COLONOSCOPY N/A 02/16/2017   Procedure: COLONOSCOPY;  Surgeon: Albertus Gordy HERO, MD;  Location: [facility]COPY;  Service: Gastroenterology;  Laterality: N/A;   COLONOSCOPY     CORONARY ARTERY BYPASS GRAFT N/A 07/19/2020   Procedure: CORONARY ARTERY BYPASS GRAFTING (CABG) TIMES THREE USING LEFT INTERNAL MAMMARY ARTERY AND RIGHT GREATER SAPHENOUS VEIN HARVESTED ENDOSCOPICALLY;  Surgeon: Lucas Dorise POUR, MD;  Location: [facility] OR;  Service: Open Heart Surgery;  Laterality: N/A;   CORONARY BALLOON ANGIOPLASTY N/A 02/16/2024   Procedure: CORONARY BALLOON ANGIOPLASTY;  Surgeon: Anner Alm ORN, MD;  Location: [facility] INVASIVE CV LAB;  Service: Cardiovascular;  Laterality: N/A;   CORONARY IMAGING/OCT N/A 02/16/2024   Procedure: CORONARY IMAGING/OCT;  Surgeon: Anner Alm ORN, MD;  Location: [facility] INVASIVE CV LAB;  Service: Cardiovascular;  Laterality: N/A;   INSERT / REPLACE / REMOVE PACEMAKER  06/20/2018   LEFT HEART CATH AND CORONARY ANGIOGRAPHY N/A 11/05/2016   Procedure: Left Heart Cath and Coronary Angiography;  Surgeon: Mady Bruckner, MD;  Location: [facility] INVASIVE CV LAB;  Service: Cardiovascular;  Laterality: N/A;   LEFT HEART CATH AND CORONARY ANGIOGRAPHY N/A 07/12/2020   Procedure: LEFT HEART CATH AND CORONARY ANGIOGRAPHY;  Surgeon: Verlin Bruckner BIRCH, MD;  Location: [facility] INVASIVE CV LAB;  Service: Cardiovascular;  Laterality: N/A;   LEFT HEART CATH AND CORS/GRAFTS ANGIOGRAPHY N/A 02/16/2024   Procedure: LEFT HEART CATH AND CORS/GRAFTS ANGIOGRAPHY;  Surgeon: Anner Alm ORN, MD;  [facility] LAB;  Service: Cardiovascular;  Laterality: N/A;   LUMBAR LAMINECTOMY/DECOMPRESSION MICRODISCECTOMY N/A 05/12/2018   Procedure: Microlumbar decompression L2-3, possible L1-2;  Surgeon: Duwayne Purchase, MD;  Location: [facility] OR;  Service: Orthopedics;  Laterality: N/A;  2 hrs, Microlumbar decompression L2-3, possible L1-2   PACEMAKER IMPLANT N/A 06/20/2018   Procedure: PACEMAKER IMPLANT;  Surgeon: Waddell Danelle ORN, MD;  Location: [facility] INVASIVE CV LAB;  Service: Cardiovascular;  Laterality: N/A;   PERCUTANEOUS CORONARY ROTOBLATOR INTERVENTION (PCI-R) N/A 06/30/2012   Procedure: PERCUTANEOUS CORONARY ROTOBLATOR INTERVENTION (PCI-R);  Surgeon: Bruckner BIRCH Verlin, MD;  Location: [facility] CATH LAB;  Service: Cardiovascular;  Laterality: N/A;   TEE WITHOUT CARDIOVERSION N/A 07/19/2020   Procedure: TRANSESOPHAGEAL ECHOCARDIOGRAM (TEE);  Surgeon: Lucas Dorise POUR, MD;  Location: [facility] OR;  Service: Open Heart Surgery;  Laterality: N/A;   VIDEO BRONCHOSCOPY  08/13/2011   Procedure: VIDEO BRONCHOSCOPY WITHOUT FLUORO;  Surgeon: Ozell KATHEE America, MD;  Location: [facility];  Service: Cardiopulmonary;  Laterality: Bilateral;     Family History  Problem Relation Age of Onset   Diabetes Mother        and brother    Kidney failure Mother    Heart disease Father        hx of MI   Breast cancer Sister    Colon cancer Neg Hx    Stomach cancer Neg Hx    Esophageal cancer Neg Hx    Rectal cancer Neg Hx      Social History   Socioeconomic History   Marital status: Married    Spouse name: Not on file   Number of children: 1   Years of education: Not on file   Highest education level: GED or equivalent  Occupational History   Occupation: Regulatory Affairs Officer  Tobacco Use   Smoking status: Former    Current packs/day: 0.00    Average packs/day: 0.5 packs/day for 40.0 years (20.0 ttl pk-yrs)    Types: Cigarettes    Start date: 08/25/1971    Quit date: 08/25/2011    Years since quitting: 12.6    Smokeless tobacco: Never  Vaping Use   Vaping status: Never Used  Substance and Sexual Activity   Alcohol use: Not Currently   Drug use: No   Sexual activity: Not Currently  Other Topics Concern   Not on file  Social History Narrative   Not on file   Social Drivers of Health   Financial Resource Strain: Low Risk  (12/27/2023)   Overall Financial Resource Strain (CARDIA)    Difficulty of Paying Living Expenses: Not very hard  Food Insecurity: Patient Declined (12/27/2023)   Hunger Vital Sign    Worried About Running Out of Food in the Last Year: Patient declined    Ran Out of Food in the Last Year: Patient declined  Transportation Needs: No Transportation Needs (12/27/2023)   PRAPARE - Administrator, Civil Service (Medical): No    Lack of Transportation (Non-Medical): No  Physical Activity: Sufficiently Active (12/27/2023)   Exercise Vital Sign    Days of Exercise per Week: 4 days    Minutes of Exercise per Session: 40 min  Stress: No Stress Concern Present (12/27/2023)   [employer] of Occupational Health - Occupational Stress Questionnaire    Feeling of Stress: Not at all  Social Connections: Moderately Isolated (12/27/2023)   Social Connection and Isolation Panel    Frequency of Communication with Friends and Family: Three times a week    Frequency of Social Gatherings with Friends and Family: Once a week    Attends Religious Services: Patient declined    Database Administrator or Organizations: No    Attends Engineer, Structural: Not on file    Marital Status: Married  Catering Manager Violence: Not on file     BP (!) 148/98   Pulse 66   Ht 5' 10 (1.778 m)   Wt 189 lb 3.2 oz (85.8 kg)   SpO2 100%   BMI 27.15 kg/m   Physical Exam:  Well appearing NAD HEENT: Unremarkable Neck:  No JVD, no thyromegally Lymphatics:  No adenopathy Back:  No CVA tenderness Lungs:  Clear with no wheezes HEART:  Regular rate rhythm, no murmurs, no rubs, no  clicks Abd:  soft, positive bowel sounds, no organomegally, no rebound, no guarding Ext:  2 plus pulses, no edema, no cyanosis, no clubbing Skin:  No rashes no nodules Neuro:  CN II through XII intact, motor grossly intact  EKG - P synch ventricular pacing.  DEVICE  Normal device function.  See PaceArt for details.   Assess/Plan:  PAF - he has had post op atrial fib but none since. We will follow. No indication for an OAC 2. CHB - he is s/p PPM and pacing 99% in the ventricle. 3. PPM -his st jude DDD PPM is working normally. 4. CAD - he has done well s/p PCI and will continue plavix .  5. Dyslipidemia - he will continue statin therapy.   Danelle Demiana Crumbley,MD

## 2024-03-30 ENCOUNTER — Other Ambulatory Visit (HOSPITAL_COMMUNITY): Payer: Self-pay

## 2024-03-30 ENCOUNTER — Ambulatory Visit

## 2024-03-30 DIAGNOSIS — I442 Atrioventricular block, complete: Secondary | ICD-10-CM | POA: Diagnosis not present

## 2024-04-01 LAB — CUP PACEART REMOTE DEVICE CHECK
Battery Remaining Longevity: 44 mo
Battery Remaining Percentage: 41 %
Battery Voltage: 2.98 V
Brady Statistic AP VP Percent: 9.4 %
Brady Statistic AP VS Percent: 1 %
Brady Statistic AS VP Percent: 90 %
Brady Statistic AS VS Percent: 1 %
Brady Statistic RA Percent Paced: 8.5 %
Brady Statistic RV Percent Paced: 99 %
Date Time Interrogation Session: 20251211020014
Implantable Lead Connection Status: 753985
Implantable Lead Connection Status: 753985
Implantable Lead Implant Date: 20200302
Implantable Lead Implant Date: 20200302
Implantable Lead Location: 753859
Implantable Lead Location: 753860
Implantable Lead Model: 3830
Implantable Pulse Generator Implant Date: 20200302
Lead Channel Impedance Value: 510 Ohm
Lead Channel Impedance Value: 540 Ohm
Lead Channel Pacing Threshold Amplitude: 0.5 V
Lead Channel Pacing Threshold Amplitude: 0.75 V
Lead Channel Pacing Threshold Pulse Width: 0.5 ms
Lead Channel Pacing Threshold Pulse Width: 0.5 ms
Lead Channel Sensing Intrinsic Amplitude: 12 mV
Lead Channel Sensing Intrinsic Amplitude: 3.6 mV
Lead Channel Setting Pacing Amplitude: 2 V
Lead Channel Setting Pacing Amplitude: 2.5 V
Lead Channel Setting Pacing Pulse Width: 0.5 ms
Lead Channel Setting Sensing Sensitivity: 4 mV
Pulse Gen Model: 2272
Pulse Gen Serial Number: 9114136

## 2024-04-02 ENCOUNTER — Ambulatory Visit: Payer: Self-pay | Admitting: Internal Medicine

## 2024-04-03 ENCOUNTER — Encounter (HOSPITAL_COMMUNITY): Admission: RE | Admit: 2024-04-03 | Discharge: 2024-04-03 | Attending: Cardiology

## 2024-04-03 ENCOUNTER — Other Ambulatory Visit: Payer: Self-pay | Admitting: *Deleted

## 2024-04-03 VITALS — BP 170/82 | HR 66 | Ht 71.0 in | Wt 189.2 lb

## 2024-04-03 DIAGNOSIS — Z9861 Coronary angioplasty status: Secondary | ICD-10-CM | POA: Diagnosis present

## 2024-04-03 NOTE — Progress Notes (Signed)
 Called patient to make sure Pantoprazole  was effective. Made patient aware that Dr. Kate verbalized this was only medications effective with Plavix .Pt verbalized an understanding and patient verbalized he will follow up with GI also.

## 2024-04-03 NOTE — Progress Notes (Addendum)
 Cardiac Individual Treatment Plan  Patient Details  Name: Daniel Carey MRN: [id number] Date of Birth: 14-May-1950 Referring Provider:   Flowsheet Row CARDIAC REHAB PHASE II ORIENTATION from 04/03/2024 in [facility]  Referring Provider Kate Bruckner MD    Initial Encounter Date:  Flowsheet Row CARDIAC REHAB PHASE II ORIENTATION from 04/03/2024 in [facility]  Date 04/03/24    Visit Diagnosis: S/P PTCA (percutaneous transluminal coronary angioplasty)  Patient's Home Medications on Admission: Current Medications[1]  Past Medical History: 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    noted on EKG March 2013   CAD (coronary artery disease)    s/p atherectomy, PTCA/DES x2 mid & distal RCA 06/30/12   Carotid artery occlusion    Chest pain    Complication of anesthesia    heart rate dropping with propofol  last procedure   COPD (chronic obstructive pulmonary disease) (HCC)    Dyspnea    ED (erectile dysfunction)    Esophageal stricture    External hemorrhoids without mention of complication    GERD (gastroesophageal reflux disease)    Hiatal hernia    History of kidney stones    HLD (hyperlipidemia)    HTN (hypertension)    Myocardial infarction (HCC)    1996   Personal history of colonic polyps 03/12/2010   TUBULAR ADENOMA   Pneumonia    Presence of permanent cardiac pacemaker    Sleep apnea    wears CPAP   Tobacco abuse    Tubular adenoma of colon    Type 2 diabetes mellitus (HCC)     Tobacco Use: Tobacco Use History[2]  Labs: Review Flowsheet  More data exists      Latest Ref Rng & Units 04/01/2023 07/06/2023 10/29/2023 11/15/2023 12/30/2023  Labs for ITP Cardiac and Pulmonary Rehab  Cholestrol 0 - 200 mg/dL - 833  - 836  883   LDL (calc) 0 - 99 mg/dL - 50  - 88  46   HDL-C >39.00 mg/dL - 53.99  - 54  59.79   Trlycerides 0.0 - 149.0 mg/dL - 651.9  - 883  849.9    Hemoglobin A1c 4.8 - 5.6 % 7.5  7.5  6.9  7.5  -     Exercise Target Goals: Exercise Program Goal: Individual exercise prescription set using results from initial 6 min walk test and THRR while considering  patients activity barriers and safety.   Exercise Prescription Goal: Initial exercise prescription builds to 30-45 minutes a day of aerobic activity, 2-3 days per week.  Home exercise guidelines will be given to patient during program as part of exercise prescription that the participant will acknowledge.   Education: Aerobic Exercise: - Group verbal and visual presentation on the components of exercise prescription. Introduces F.I.T.T principle from ACSM for exercise prescriptions.  Reviews F.I.T.T. principles of aerobic exercise including progression. Written material provided at class time.   Education: Resistance Exercise: - Group verbal and visual presentation on the components of exercise prescription. Introduces F.I.T.T principle from ACSM for exercise prescriptions  Reviews F.I.T.T. principles of resistance exercise including progression. Written material provided at class time.    Education: Exercise & Equipment Safety: - Individual verbal instruction and demonstration of equipment use and safety with use of the equipment.   Education: Exercise Physiology & General Exercise Guidelines: - Group verbal and written instruction with models to review the exercise physiology of the cardiovascular system  and associated critical values. Provides general exercise guidelines with specific guidelines to those with heart or lung disease. Written material provided at class time.   Education: Flexibility, Balance, Mind/Body Relaxation: - Group verbal and visual presentation with interactive activity on the components of exercise prescription. Introduces F.I.T.T principle from ACSM for exercise prescriptions. Reviews F.I.T.T. principles of flexibility and balance exercise training  including progression. Also discusses the mind body connection.  Reviews various relaxation techniques to help reduce and manage stress (i.e. Deep breathing, progressive muscle relaxation, and visualization). Balance handout provided to take home. Written material provided at class time.   Activity Barriers & Risk Stratification:  Activity Barriers & Cardiac Risk Stratification - 03/28/24 1303       Activity Barriers & Cardiac Risk Stratification   Activity Barriers Muscular Weakness;Deconditioning;Arthritis   right hip   Cardiac Risk Stratification Moderate          6 Minute Walk:  6 Minute Walk     Row Name 04/03/24 1404         6 Minute Walk   Phase Initial     Distance 1200 feet     Walk Time 6 minutes     # of Rest Breaks 0     MPH 2.27     METS 2.9     RPE 12     VO2 Peak 10.21     Symptoms No     Resting HR 66 bpm     Resting BP 170/82     Resting Oxygen Saturation  96 %     Exercise Oxygen Saturation  during 6 min walk 96 %     Max Ex. HR 98 bpm     Max Ex. BP 156/80     2 Minute Post BP 140/80        Oxygen Initial Assessment:   Oxygen Re-Evaluation:   Oxygen Discharge (Final Oxygen Re-Evaluation):   Initial Exercise Prescription:  Initial Exercise Prescription - 04/03/24 1400       Date of Initial Exercise RX and Referring Provider   Date 04/03/24    Referring Provider Kate Bruckner MD      Treadmill   MPH 1.5    Grade 0    Minutes 15    METs 2.15      REL-XR   Level 2    Speed 50    Minutes 15    METs 2.4      Prescription Details   Frequency (times per week) 2    Duration Progress to 30 minutes of continuous aerobic without signs/symptoms of physical distress      Intensity   THRR 40-80% of Max Heartrate 99-132    Ratings of Perceived Exertion 11-13      Resistance Training   Training Prescription Yes    Weight 5    Reps 10-15          Perform Capillary Blood Glucose checks as needed.  Exercise Prescription  Changes:   Exercise Prescription Changes     Row Name 04/03/24 1400             Response to Exercise   Blood Pressure (Admit) 170/82       Blood Pressure (Exercise) 156/80       Blood Pressure (Exit) 140/80       Heart Rate (Admit) 66 bpm       Heart Rate (Exercise) 98 bpm       Heart Rate (Exit) 75 bpm  Oxygen Saturation (Admit) 96 %       Oxygen Saturation (Exercise) 96 %       Oxygen Saturation (Exit) 96 %       Rating of Perceived Exertion (Exercise) 12          Exercise Comments:   Exercise Comments     Row Name 03/28/24 1310           Exercise Comments Nabil states  that he walks on the treadmill at home.          Exercise Goals and Review:   Exercise Goals     Row Name 03/28/24 1310             Exercise Goals   Increase Physical Activity Yes       Intervention Provide advice, education, support and counseling about physical activity/exercise needs.;Develop an individualized exercise prescription for aerobic and resistive training based on initial evaluation findings, risk stratification, comorbidities and participant's personal goals.       Expected Outcomes Short Term: Attend rehab on a regular basis to increase amount of physical activity.;Long Term: Exercising regularly at least 3-5 days a week.;Long Term: Add in home exercise to make exercise part of routine and to increase amount of physical activity.       Increase Strength and Stamina Yes       Intervention Provide advice, education, support and counseling about physical activity/exercise needs.;Develop an individualized exercise prescription for aerobic and resistive training based on initial evaluation findings, risk stratification, comorbidities and participant's personal goals.       Expected Outcomes Short Term: Increase workloads from initial exercise prescription for resistance, speed, and METs.;Short Term: Perform resistance training exercises routinely during rehab and add in  resistance training at home;Long Term: Improve cardiorespiratory fitness, muscular endurance and strength as measured by increased METs and functional capacity ( )       Able to understand and use rate of perceived exertion (RPE) scale Yes       Intervention Provide education and explanation on how to use RPE scale       Expected Outcomes Short Term: Able to use RPE daily in rehab to express subjective intensity level;Long Term:  Able to use RPE to guide intensity level when exercising independently       Knowledge and understanding of Target Heart Rate Range (THRR) Yes       Intervention Provide education and explanation of THRR including how the numbers were predicted and where they are located for reference       Expected Outcomes Short Term: Able to state/look up THRR;Long Term: Able to use THRR to govern intensity when exercising independently;Short Term: Able to use daily as guideline for intensity in rehab       Able to check pulse independently Yes       Intervention Provide education and demonstration on how to check pulse in carotid and radial arteries.;Review the importance of being able to check your own pulse for safety during independent exercise       Expected Outcomes Long Term: Able to check pulse independently and accurately;Short Term: Able to explain why pulse checking is important during independent exercise       Understanding of Exercise Prescription Yes       Intervention Provide education, explanation, and written materials on patient's individual exercise prescription       Expected Outcomes Short Term: Able to explain program exercise prescription;Long Term: Able to explain home exercise prescription to exercise  independently          Exercise Goals Re-Evaluation :   Discharge Exercise Prescription (Final Exercise Prescription Changes):  Exercise Prescription Changes - 04/03/24 1400       Response to Exercise   Blood Pressure (Admit) 170/82    Blood Pressure  (Exercise) 156/80    Blood Pressure (Exit) 140/80    Heart Rate (Admit) 66 bpm    Heart Rate (Exercise) 98 bpm    Heart Rate (Exit) 75 bpm    Oxygen Saturation (Admit) 96 %    Oxygen Saturation (Exercise) 96 %    Oxygen Saturation (Exit) 96 %    Rating of Perceived Exertion (Exercise) 12          Nutrition:  Target Goals: Understanding of nutrition guidelines, daily intake of sodium 1500mg , cholesterol 200mg , calories 30% from fat and 7% or less from saturated fats, daily to have 5 or more servings of fruits and vegetables.  Education: Nutrition 1 -Group instruction provided by verbal, written material, interactive activities, discussions, models, and posters to present general guidelines for heart healthy nutrition including macronutrients, label reading, and promoting whole foods over processed counterparts. Education serves as pensions consultant of discussion of heart healthy eating for all. Written material provided at class time. Flowsheet Row CARDIAC REHAB PHASE II ORIENTATION from 04/03/2024 in [facility]  Education need identified 04/03/24     Education: Nutrition 2 -Group instruction provided by verbal, written material, interactive activities, discussions, models, and posters to present general guidelines for heart healthy nutrition including sodium, cholesterol, and saturated fat. Providing guidance of habit forming to improve blood pressure, cholesterol, and body weight. Written material provided at class time. Flowsheet Row CARDIAC REHAB PHASE II ORIENTATION from 04/03/2024 in [facility]  Education need identified 04/03/24      Biometrics:  Pre Biometrics - 04/03/24 1407       Pre Biometrics   Height 5' 11 (1.803 m)    Weight 85.8 kg    Waist Circumference 40 inches    Hip Circumference 42 inches    Waist to Hip Ratio 0.95 %    BMI (Calculated) 26.39    Grip Strength 27.7 kg    Single Leg Stand 23.46 seconds            Nutrition Therapy Plan and Nutrition Goals:  Nutrition Therapy & Goals - 03/28/24 1315       Intervention Plan   Intervention Prescribe, educate and counsel regarding individualized specific dietary modifications aiming towards targeted core components such as weight, hypertension, lipid management, diabetes, heart failure and other comorbidities.;Nutrition handout(s) given to patient.    Expected Outcomes Long Term Goal: Adherence to prescribed nutrition plan.;Short Term Goal: A plan has been developed with personal nutrition goals set during dietitian appointment.;Short Term Goal: Understand basic principles of dietary content, such as calories, fat, sodium, cholesterol and nutrients.          Nutrition Assessments:  MEDIFICTS Score Key: >=70 Need to make dietary changes  40-70 Heart Healthy Diet <= 40 Therapeutic Level Cholesterol Diet  Flowsheet Row CARDIAC REHAB PHASE II ORIENTATION from 04/03/2024 in [facility]  Picture Your Plate Total Score on Admission 62   Picture Your Plate Scores: <59 Unhealthy dietary pattern with much room for improvement. 41-50 Dietary pattern unlikely to meet recommendations for good health and room for improvement. 51-60 More healthful dietary pattern, with some room for improvement.  >60 Healthy dietary pattern, although there may be some  specific behaviors that could be improved.    Nutrition Goals Re-Evaluation:   Nutrition Goals Discharge (Final Nutrition Goals Re-Evaluation):   Psychosocial: Target Goals: Acknowledge presence or absence of significant depression and/or stress, maximize coping skills, provide positive support system. Participant is able to verbalize types and ability to use techniques and skills needed for reducing stress and depression.   Education: Stress, Anxiety, and Depression - Group verbal and visual presentation to define topics covered.  Reviews how body is impacted by stress,  anxiety, and depression.  Also discusses healthy ways to reduce stress and to treat/manage anxiety and depression. Written material provided at class time.   Education: Sleep Hygiene -Provides group verbal and written instruction about how sleep can affect your health.  Define sleep hygiene, discuss sleep cycles and impact of sleep habits. Review good sleep hygiene tips.   Initial Review & Psychosocial Screening:  Initial Psych Review & Screening - 03/28/24 1315       Initial Review   Current issues with None Identified      Family Dynamics   Good Support System? Yes      Barriers   Psychosocial barriers to participate in program There are no identifiable barriers or psychosocial needs.      Screening Interventions   Interventions Encouraged to exercise    Expected Outcomes Long Term goal: The participant improves quality of Life and PHQ9 Scores as seen by post scores and/or verbalization of changes;Short Term goal: Identification and review with participant of any Quality of Life or Depression concerns found by scoring the questionnaire.;Long Term Goal: Stressors or current issues are controlled or eliminated.;Short Term goal: Utilizing psychosocial counselor, staff and physician to assist with identification of specific Stressors or current issues interfering with healing process. Setting desired goal for each stressor or current issue identified.          Quality of Life Scores:   Quality of Life - 04/03/24 1429       Quality of Life   Select Quality of Life      Quality of Life Scores   Health/Function Pre 22.03 %    Socioeconomic Pre 23.38 %    Psych/Spiritual Pre 30 %    Family Pre 26.4 %    GLOBAL Pre 24.56 %         Scores of 19 and below usually indicate a poorer quality of life in these areas.  A difference of  2-3 points is a clinically meaningful difference.  A difference of 2-3 points in the total score of the Quality of Life Index has been associated with  significant improvement in overall quality of life, self-image, physical symptoms, and general health in studies assessing change in quality of life.  PHQ-9: Review Flowsheet  More data exists      04/03/2024 01/13/2024 12/30/2023 07/06/2023 12/22/2022  Depression screen PHQ 2/9  Decreased Interest 0 0 0 0 0  Down, Depressed, Hopeless 0 - 0 0 0  PHQ - 2 Score 0 0 0 0 0  Altered sleeping 0 - - - -  Tired, decreased energy 1 - - - -  Change in appetite 0 - - - -  Feeling bad or failure about yourself  0 - - - -  Trouble concentrating 0 - - - -  Moving slowly or fidgety/restless 0 - - - -  Suicidal thoughts 0 - - - -  PHQ-9 Score 1 - - - -  Difficult doing work/chores Not difficult at all - - - -  Interpretation of Total Score  Total Score Depression Severity:  1-4 = Minimal depression, 5-9 = Mild depression, 10-14 = Moderate depression, 15-19 = Moderately severe depression, 20-27 = Severe depression   Psychosocial Evaluation and Intervention:  Psychosocial Evaluation - 03/28/24 1315       Psychosocial Evaluation & Interventions   Interventions Encouraged to exercise with the program and follow exercise prescription    Comments Betty is a pleasant 73 year old male who is coming into cardiac rehab for S/P PTCA. He has went through the program before. He has his wife as his main support system. He currently exercises some at home by walking on the treadmill. He identifies no current stressors or sleep issues. He has a pacemaker and history of sleep apnea with wearing a CPAP at night. He is eager to start the program.    Expected Outcomes Short: Increase strength and stamina. Long: Get back in shape.    Continue Psychosocial Services  Follow up required by staff          Psychosocial Re-Evaluation:   Psychosocial Discharge (Final Psychosocial Re-Evaluation):   Vocational Rehabilitation: Provide vocational rehab assistance to qualifying candidates.   Vocational Rehab Evaluation  & Intervention:  Vocational Rehab - 03/28/24 1305       Initial Vocational Rehab Evaluation & Intervention   Assessment shows need for Vocational Rehabilitation No          Education: Education Goals: Education classes will be provided on a variety of topics geared toward better understanding of heart health and risk factor modification. Participant will state understanding/return demonstration of topics presented as noted by education test scores.  Learning Barriers/Preferences:  Learning Barriers/Preferences - 03/28/24 1304       Learning Barriers/Preferences   Learning Barriers None    Learning Preferences None          General Cardiac Education Topics:  AED/CPR: - Group verbal and written instruction with the use of models to demonstrate the basic use of the AED with the basic ABC's of resuscitation.   Test and Procedures: - Group verbal and visual presentation and models provide information about basic cardiac anatomy and function. Reviews the testing methods done to diagnose heart disease and the outcomes of the test results. Describes the treatment choices: Medical Management, Angioplasty, or Coronary Bypass Surgery for treating various heart conditions including Myocardial Infarction, Angina, Valve Disease, and Cardiac Arrhythmias. Written material provided at class time.   Medication Safety: - Group verbal and visual instruction to review commonly prescribed medications for heart and lung disease. Reviews the medication, class of the drug, and side effects. Includes the steps to properly store meds and maintain the prescription regimen. Written material provided at class time.   Intimacy: - Group verbal instruction through game format to discuss how heart and lung disease can affect sexual intimacy. Written material provided at class time.   Know Your Numbers and Heart Failure: - Group verbal and visual instruction to discuss disease risk factors for cardiac and  pulmonary disease and treatment options.  Reviews associated critical values for Overweight/Obesity, Hypertension, Cholesterol, and Diabetes.  Discusses basics of heart failure: signs/symptoms and treatments.  Introduces Heart Failure Zone chart for action plan for heart failure. Written material provided at class time.   Infection Prevention: - Provides verbal and written material to individual with discussion of infection control including proper hand washing and proper equipment cleaning during exercise session.   Falls Prevention: - Provides verbal and written material to individual with  discussion of falls prevention and safety.   Other: -Provides group and verbal instruction on various topics (see comments)   Knowledge Questionnaire Score:  Knowledge Questionnaire Score - 04/03/24 1429       Knowledge Questionnaire Score   Pre Score 25/26          Core Components/Risk Factors/Patient Goals at Admission:  Personal Goals and Risk Factors at Admission - 03/28/24 1314       Core Components/Risk Factors/Patient Goals on Admission   Hypertension Yes    Intervention Provide education on lifestyle modifcations including regular physical activity/exercise, weight management, moderate sodium restriction and increased consumption of fresh fruit, vegetables, and low fat dairy, alcohol moderation, and smoking cessation.;Monitor prescription use compliance.    Expected Outcomes Short Term: Continued assessment and intervention until BP is < 140/84mm HG in hypertensive participants. < 130/23mm HG in hypertensive participants with diabetes, heart failure or chronic kidney disease.;Long Term: Maintenance of blood pressure at goal levels.    Lipids Yes    Intervention Provide education and support for participant on nutrition & aerobic/resistive exercise along with prescribed medications to achieve LDL 70mg , HDL >40mg .    Expected Outcomes Short Term: Participant states understanding of  desired cholesterol values and is compliant with medications prescribed. Participant is following exercise prescription and nutrition guidelines.;Long Term: Cholesterol controlled with medications as prescribed, with individualized exercise RX and with personalized nutrition plan. Value goals: LDL < 70mg , HDL > 40 mg.          Education:Diabetes - Individual verbal and written instruction to review signs/symptoms of diabetes, desired ranges of glucose level fasting, after meals and with exercise. Acknowledge that pre and post exercise glucose checks will be done for 3 sessions at entry of program.   Core Components/Risk Factors/Patient Goals Review:    Core Components/Risk Factors/Patient Goals at Discharge (Final Review):    ITP Comments:  ITP Comments     Row Name 03/28/24 1314 04/03/24 1416         ITP Comments Completed virtual orientation today.  EP evaluation is scheduled for 04/03/24 at 0830 .  Documentation for diagnosis can be found in [facility] encounter [facility]/16/24. Patient arrived for 1st visit/orientation/education at 1300. Patient was referred to CR by Dr. Lonni Nanas due to Status Post PTCA. During orientation advised patient on arrival and appointment times what to wear, what to do before, during and after exercise. Reviewed attendance and class policy.  Pt is scheduled to return Cardiac Rehab on 04/04/24 at 1330. Pt was advised to come to class 15 minutes before class starts.  Discussed RPE/Dpysnea scales. Patient participated in warm up stretches. Patient was able to complete 6 minute walk test.  Telemetry:NSR. Patient was measured for the equipment. Discussed equipment safety with patient. Took patient pre-anthropometric measurements. Patient finished visit at 1410.         Comments: Patient arrived for 1st visit/orientation/education at 1300. Patient was referred to CR by Dr. Lonni Nanas due to Status Post PTCA. During orientation advised patient on arrival and  appointment times what to wear, what to do before, during and after exercise. Reviewed attendance and class policy.  Pt is scheduled to return Cardiac Rehab on 04/04/24 at 1330. Pt was advised to come to class 15 minutes before class starts.  Discussed RPE/Dpysnea scales. Patient participated in warm up stretches. Patient was able to complete 6 minute walk test.  Telemetry:NSR. Patient was measured for the equipment. Discussed equipment safety with patient. Took patient pre-anthropometric measurements. Patient finished  visit at 1410.      [1]  Current Outpatient Medications:    Accu-Chek Softclix Lancets lancets, Use as instructed 1x a day, Disp: 100 each, Rfl: 3   acetaminophen  (TYLENOL ) 500 MG tablet, Take 1,000 mg by mouth every 6 (six) hours as needed (pain). , Disp: , Rfl:    albuterol  (VENTOLIN  HFA) 108 (90 Base) MCG/ACT inhaler, INHALE 2 PUFFS INTO THE LUNGS EVERY 6 HOURS AS NEEDED FOR WHEEZE OR SHORTNESS OF BREATH, Disp: 8.5 each, Rfl: 3   aspirin  EC 81 MG tablet, Take 1 tablet (81 mg total) by mouth daily. Swallow whole., Disp: , Rfl:    Blood Glucose Monitoring Suppl (ACCU-CHEK GUIDE) w/Device KIT, Use as advised, Disp: 1 kit, Rfl: 0   clopidogrel  (PLAVIX ) 75 MG tablet, Take 1 tablet (75 mg total) by mouth daily with breakfast., Disp: 90 tablet, Rfl: 2   ezetimibe  (ZETIA ) 10 MG tablet, Take 1 tablet (10 mg total) by mouth daily., Disp: 90 tablet, Rfl: 3   famotidine  (PEPCID ) 20 MG tablet, Take 20 mg by mouth daily as needed for heartburn., Disp: , Rfl:    fenofibrate  (TRICOR ) 145 MG tablet, TAKE 1 TABLET BY MOUTH EVERY DAY, Disp: 90 tablet, Rfl: 3   glucose blood test strip, Use as instructed 1x a day, Disp: 100 each, Rfl: 3   hydrochlorothiazide  (HYDRODIURIL ) 25 MG tablet, TAKE 1/2 TABLET (12.5 MG TOTAL) BY MOUTH DAILY AS NEEDED., Disp: 45 tablet, Rfl: 0   ipratropium (ATROVENT ) 0.06 % nasal spray, PLACE 2 SPRAYS INTO BOTH NOSTRILS 4 (FOUR) TIMES DAILY AS DIRECTED, Disp: 90 mL, Rfl: 1    metoprolol  tartrate (LOPRESSOR ) 25 MG tablet, Take 0.5 tablets (12.5 mg total) by mouth 2 (two) times daily., Disp: 90 tablet, Rfl: 3   Na Sulfate-K Sulfate-Mg Sulfate concentrate (SUPREP) 17.5-3.13-1.6 GM/177ML SOLN, TAKE 1 KIT (354 MLS TOTAL) BY MOUTH ONCE FOR 1 DOSE., Disp: , Rfl:    nitroGLYCERIN  (NITROSTAT ) 0.4 MG SL tablet, Place 1 tablet (0.4 mg total) under the tongue every 5 (five) minutes as needed for chest pain., Disp: 25 tablet, Rfl: 3   pantoprazole  (PROTONIX ) 40 MG tablet, Take 1 tablet (40 mg total) by mouth daily., Disp: 30 tablet, Rfl: 6   Pharmacist Choice Lancets MISC, Use to check blood sugar 1-2 times a day, Disp: 200 each, Rfl: 5   Propylene Glycol (SYSTANE COMPLETE) 0.6 % SOLN, Place 1 drop into both eyes daily., Disp: , Rfl:    rosuvastatin  (CRESTOR ) 20 MG tablet, Take 1 tablet (20 mg total) by mouth daily., Disp: 90 tablet, Rfl: 3   sildenafil  (VIAGRA ) 100 MG tablet, Take 1 tablet by mouth prior to sexual activity as needed, Disp: 15 tablet, Rfl: 11 [2]  Social History Tobacco Use  Smoking Status Former   Current packs/day: 0.00   Average packs/day: 0.5 packs/day for 40.0 years (20.0 ttl pk-yrs)   Types: Cigarettes   Start date: 08/25/1971   Quit date: 08/25/2011   Years since quitting: 12.6  Smokeless Tobacco Never

## 2024-04-03 NOTE — Patient Instructions (Signed)
 Patient Instructions  Patient Details  Name: Daniel Carey MRN: [id number] Date of Birth: 1950/10/23 Referring Provider:  Kate Lonni LITTIE, MD  Below are your personal goals for exercise, nutrition, and risk factors. Our goal is to help you stay on track towards obtaining and maintaining these goals. We will be discussing your progress on these goals with you throughout the program.  Initial Exercise Prescription:  Initial Exercise Prescription - 04/03/24 1400       Date of Initial Exercise RX and Referring Provider   Date 04/03/24    Referring Provider Kate Lonni MD      Treadmill   MPH 1.5    Grade 0    Minutes 15    METs 2.15      REL-XR   Level 2    Speed 50    Minutes 15    METs 2.4      Prescription Details   Frequency (times per week) 2    Duration Progress to 30 minutes of continuous aerobic without signs/symptoms of physical distress      Intensity   THRR 40-80% of Max Heartrate 99-132    Ratings of Perceived Exertion 11-13      Resistance Training   Training Prescription Yes    Weight 5    Reps 10-15          Exercise Goals: Frequency: Be able to perform aerobic exercise two to three times per week in program working toward 2-5 days per week of home exercise.  Intensity: Work with a perceived exertion of 11 (fairly light) - 15 (hard) while following your exercise prescription.  We will make changes to your prescription with you as you progress through the program.   Duration: Be able to do 30 to 45 minutes of continuous aerobic exercise in addition to a 5 minute warm-up and a 5 minute cool-down routine.   Nutrition Goals: Your personal nutrition goals will be established when you do your nutrition analysis with the dietician.  The following are general nutrition guidelines to follow: Cholesterol < 200mg /day Sodium < 1500mg /day Fiber: Men over 50 yrs - 30 grams per day  Personal Goals:  Personal Goals and Risk Factors at  Admission - 03/28/24 1314       Core Components/Risk Factors/Patient Goals on Admission   Hypertension Yes    Intervention Provide education on lifestyle modifcations including regular physical activity/exercise, weight management, moderate sodium restriction and increased consumption of fresh fruit, vegetables, and low fat dairy, alcohol moderation, and smoking cessation.;Monitor prescription use compliance.    Expected Outcomes Short Term: Continued assessment and intervention until BP is < 140/50mm HG in hypertensive participants. < 130/65mm HG in hypertensive participants with diabetes, heart failure or chronic kidney disease.;Long Term: Maintenance of blood pressure at goal levels.    Lipids Yes    Intervention Provide education and support for participant on nutrition & aerobic/resistive exercise along with prescribed medications to achieve LDL 70mg , HDL >40mg .    Expected Outcomes Short Term: Participant states understanding of desired cholesterol values and is compliant with medications prescribed. Participant is following exercise prescription and nutrition guidelines.;Long Term: Cholesterol controlled with medications as prescribed, with individualized exercise RX and with personalized nutrition plan. Value goals: LDL < 70mg , HDL > 40 mg.          Tobacco Use Initial Evaluation: Social History   Tobacco Use  Smoking Status Former   Current packs/day: 0.00   Average packs/day: 0.5 packs/day for 40.0 years (20.0 ttl  pk-yrs)   Types: Cigarettes   Start date: 08/25/1971   Quit date: 08/25/2011   Years since quitting: 12.6  Smokeless Tobacco Never    Exercise Goals and Review:  Exercise Goals     Row Name 03/28/24 1310             Exercise Goals   Increase Physical Activity Yes       Intervention Provide advice, education, support and counseling about physical activity/exercise needs.;Develop an individualized exercise prescription for aerobic and resistive training based on  initial evaluation findings, risk stratification, comorbidities and participant's personal goals.       Expected Outcomes Short Term: Attend rehab on a regular basis to increase amount of physical activity.;Long Term: Exercising regularly at least 3-5 days a week.;Long Term: Add in home exercise to make exercise part of routine and to increase amount of physical activity.       Increase Strength and Stamina Yes       Intervention Provide advice, education, support and counseling about physical activity/exercise needs.;Develop an individualized exercise prescription for aerobic and resistive training based on initial evaluation findings, risk stratification, comorbidities and participant's personal goals.       Expected Outcomes Short Term: Increase workloads from initial exercise prescription for resistance, speed, and METs.;Short Term: Perform resistance training exercises routinely during rehab and add in resistance training at home;Long Term: Improve cardiorespiratory fitness, muscular endurance and strength as measured by increased METs and functional capacity ( )       Able to understand and use rate of perceived exertion (RPE) scale Yes       Intervention Provide education and explanation on how to use RPE scale       Expected Outcomes Short Term: Able to use RPE daily in rehab to express subjective intensity level;Long Term:  Able to use RPE to guide intensity level when exercising independently       Knowledge and understanding of Target Heart Rate Range (THRR) Yes       Intervention Provide education and explanation of THRR including how the numbers were predicted and where they are located for reference       Expected Outcomes Short Term: Able to state/look up THRR;Long Term: Able to use THRR to govern intensity when exercising independently;Short Term: Able to use daily as guideline for intensity in rehab       Able to check pulse independently Yes       Intervention Provide education and  demonstration on how to check pulse in carotid and radial arteries.;Review the importance of being able to check your own pulse for safety during independent exercise       Expected Outcomes Long Term: Able to check pulse independently and accurately;Short Term: Able to explain why pulse checking is important during independent exercise       Understanding of Exercise Prescription Yes       Intervention Provide education, explanation, and written materials on patient's individual exercise prescription       Expected Outcomes Short Term: Able to explain program exercise prescription;Long Term: Able to explain home exercise prescription to exercise independently          Copy of goals given to participant.

## 2024-04-04 ENCOUNTER — Encounter (HOSPITAL_COMMUNITY): Admission: RE | Admit: 2024-04-04 | Discharge: 2024-04-04 | Attending: Cardiology

## 2024-04-04 DIAGNOSIS — Z9861 Coronary angioplasty status: Secondary | ICD-10-CM

## 2024-04-04 NOTE — Progress Notes (Signed)
 Daily Session Note  Patient Details  Name: Daniel Carey MRN: [id number] Date of Birth: [birth year] Referring Provider:   Flowsheet Row CARDIAC REHAB PHASE II ORIENTATION from 04/03/2024 in [facility]  Referring Provider Kate Bruckner MD    Encounter Date: 04/04/2024  Check In:  Session Check In - 04/04/24 1328       Check-In   Supervising physician immediately available to respond to emergencies See telemetry face sheet for immediately available MD    Location [facility]    Staff Present Powell Benders, BS, Exercise Physiologist;Victoria Zina, RN;Jessica Mableton, MA, RCEP, CCRP, CCET    Virtual Visit No    Medication changes reported     No    Fall or balance concerns reported    No    Tobacco Cessation No Change    Warm-up and Cool-down Performed on first and last piece of equipment    Resistance Training Performed Yes    VAD Patient? No    PAD/SET Patient? No      Pain Assessment   Currently in Pain? No/denies    Multiple Pain Sites No          Capillary Blood Glucose: No results found for this or any previous visit (from the past 24 hours).    Tobacco Use History[1]  Goals Met:  Independence with exercise equipment Exercise tolerated well No report of concerns or symptoms today Strength training completed today  Goals Unmet:  Not Applicable  Comments: First full day of exercise!  Patient was oriented to gym and equipment including functions, settings, policies, and procedures.  Patient's individual exercise prescription and treatment plan were reviewed.  All starting workloads were established based on the results of the 6 minute walk test done at initial orientation visit.  The plan for exercise progression was also introduced and progression will be customized based on patient's performance and goals.        [1]  Social History Tobacco Use  Smoking Status Former   Current packs/day: 0.00   Average  packs/day: 0.5 packs/day for 40.0 years (20.0 ttl pk-yrs)   Types: Cigarettes   Start date: 08/25/1971   Quit date: 08/25/2011   Years since quitting: 12.6  Smokeless Tobacco Never

## 2024-04-05 ENCOUNTER — Other Ambulatory Visit: Payer: Self-pay

## 2024-04-05 DIAGNOSIS — M5412 Radiculopathy, cervical region: Secondary | ICD-10-CM

## 2024-04-06 ENCOUNTER — Encounter (HOSPITAL_COMMUNITY): Admission: RE | Admit: 2024-04-06 | Discharge: 2024-04-06 | Attending: Cardiology

## 2024-04-06 ENCOUNTER — Ambulatory Visit

## 2024-04-06 VITALS — Ht 70.0 in | Wt 189.0 lb

## 2024-04-06 DIAGNOSIS — Z9861 Coronary angioplasty status: Secondary | ICD-10-CM | POA: Diagnosis not present

## 2024-04-06 DIAGNOSIS — Z860100 Personal history of colon polyps, unspecified: Secondary | ICD-10-CM

## 2024-04-06 NOTE — Progress Notes (Signed)
 Daily Session Note  Patient Details  Name: KHOURY SIEMON MRN: [id number] Date of Birth: 1951/02/08 Referring Provider:   Flowsheet Row CARDIAC REHAB PHASE II ORIENTATION from 04/03/2024 in [facility] C[facility]AC [facility]  Referring Provider Kate Bruckner MD    Encounter Date: 04/06/2024  Check In:  Session Check In - 04/06/24 1403       Check-In   Supervising physician immediately available to respond to emergencies See telemetry face sheet for immediately available MD    Location [facility]    Staff Present Powell Benders, BS, Exercise Physiologist;Debra Vicci, RN, BSN;Taraneh Metheney BSN, RN    Virtual Visit No    Medication changes reported     No    Fall or balance concerns reported    No    Tobacco Cessation No Change    Warm-up and Cool-down Performed on first and last piece of equipment    Resistance Training Performed Yes    VAD Patient? No    PAD/SET Patient? No      Pain Assessment   Currently in Pain? No/denies    Multiple Pain Sites No          Capillary Blood Glucose: No results found for this or any previous visit (from the past 24 hours).    Tobacco Use History[1]  Goals Met:  Independence with exercise equipment Exercise tolerated well No report of concerns or symptoms today Strength training completed today  Goals Unmet:  Not Applicable  Comments: SABRASABRAPt able to follow exercise prescription today without complaint.  Will continue to monitor for progression.        [1]  Social History Tobacco Use  Smoking Status Former   Current packs/day: 0.00   Average packs/day: 0.5 packs/day for 40.0 years (20.0 ttl pk-yrs)   Types: Cigarettes   Start date: 08/25/1971   Quit date: 08/25/2011   Years since quitting: 12.6  Smokeless Tobacco Never

## 2024-04-06 NOTE — Progress Notes (Signed)
 Remote PPM Transmission

## 2024-04-06 NOTE — Progress Notes (Unsigned)
 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  Pre-visit completed and red dot placed by patient's name on their procedure day (on provider's schedule).   Visit by phone  Pt states weight is 195 lb  Pt has Suprep  Pt given  both [facility] main # and MD on call # prior to instructions.  Informed pt to come in at the time discussed and is shown on PV instructions.  Pt instructed to use [employer] or GoodRx for a price reduction on prep  Instructed pt where to find PV instructions in My Chart. . Instructed pt on all aspects of written instructions including med holds clothing to wear and foods to eat and not eat as well as after procedure legal restrictions and to call MD on call if needed.. Pt states understanding. Instructed pt to review instructions again prior to procedure and call main # given if has any questions or any issues. Pt states they will.

## 2024-04-11 ENCOUNTER — Telehealth (HOSPITAL_COMMUNITY): Payer: Self-pay

## 2024-04-11 ENCOUNTER — Encounter (HOSPITAL_COMMUNITY): Admission: RE | Admit: 2024-04-11

## 2024-04-12 LAB — CUP PACEART INCLINIC DEVICE CHECK
Battery Remaining Longevity: 45 mo
Battery Voltage: 2.98 V
Brady Statistic RA Percent Paced: 7.7 %
Brady Statistic RV Percent Paced: 99.35 %
Date Time Interrogation Session: 20251210163800
Implantable Lead Connection Status: 753985
Implantable Lead Connection Status: 753985
Implantable Lead Implant Date: 20200302
Implantable Lead Implant Date: 20200302
Implantable Lead Location: 753859
Implantable Lead Location: 753860
Implantable Lead Model: 3830
Implantable Pulse Generator Implant Date: 20200302
Lead Channel Impedance Value: 512.5 Ohm
Lead Channel Impedance Value: 537.5 Ohm
Lead Channel Pacing Threshold Amplitude: 0.5 V
Lead Channel Pacing Threshold Amplitude: 0.75 V
Lead Channel Pacing Threshold Pulse Width: 0.5 ms
Lead Channel Pacing Threshold Pulse Width: 0.5 ms
Lead Channel Sensing Intrinsic Amplitude: 12 mV
Lead Channel Sensing Intrinsic Amplitude: 3.6 mV
Lead Channel Setting Pacing Amplitude: 2 V
Lead Channel Setting Pacing Amplitude: 2.5 V
Lead Channel Setting Pacing Pulse Width: 0.5 ms
Lead Channel Setting Sensing Sensitivity: 4 mV
Pulse Gen Model: 2272
Pulse Gen Serial Number: 9114136

## 2024-04-18 ENCOUNTER — Encounter (HOSPITAL_COMMUNITY)
Admission: RE | Admit: 2024-04-18 | Discharge: 2024-04-18 | Disposition: A | Discharge status: Home / Self Care | Source: Ambulatory Visit | Attending: Cardiology | Admitting: Cardiology

## 2024-04-18 ENCOUNTER — Encounter (HOSPITAL_COMMUNITY): Payer: Self-pay | Admitting: *Deleted

## 2024-04-18 DIAGNOSIS — Z9861 Coronary angioplasty status: Secondary | ICD-10-CM

## 2024-04-18 NOTE — Progress Notes (Signed)
 Cardiac Individual Treatment Plan  Patient Details  Name: Daniel Carey MRN: [id number] Date of Birth: Sep 06, 1950 (73) Referring Provider:   Flowsheet Row CARDIAC REHAB PHASE II ORIENTATION from 04/03/2024 in [facility]  Referring Provider Kate Bruckner MD    Initial Encounter Date:  Flowsheet Row CARDIAC REHAB PHASE II ORIENTATION from 04/03/2024 in [place] [facility]  Date 04/03/24    Visit Diagnosis: S/P PTCA (percutaneous transluminal coronary angioplasty)  Patient's Home Medications on Admission: Current Medications[1]  Past Medical History: 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    noted on EKG March 2013   CAD (coronary artery disease)    s/p atherectomy, PTCA/DES x2 mid & distal RCA 06/30/12   Carotid artery occlusion    Chest pain    Chronic kidney disease    Stone   Complication of anesthesia    heart rate dropping with propofol  last procedure   COPD (chronic obstructive pulmonary disease) (HCC)    Dyspnea    ED (erectile dysfunction)    Esophageal stricture    External hemorrhoids without mention of complication    GERD (gastroesophageal reflux disease)    Hiatal hernia    History of kidney stones    HLD (hyperlipidemia)    HTN (hypertension)    Myocardial infarction (HCC)    1996   Personal history of colonic polyps 03/12/2010   TUBULAR ADENOMA   Pneumonia    Presence of permanent cardiac pacemaker    Sleep apnea    wears CPAP   Tobacco abuse    Tubular adenoma of colon    Type 2 diabetes mellitus (HCC)     Tobacco Use: Tobacco Use History[2]  Labs: Review Flowsheet  More data exists      Latest Ref Rng & Units 04/01/2023 07/06/2023 10/29/2023 11/15/2023 12/30/2023  Labs for ITP Cardiac and Pulmonary Rehab  Cholestrol 0 - 200 mg/dL - 833  - 836  883   LDL (calc) 0 - 99 mg/dL - 50  - 88  46   HDL-C >39.00 mg/dL - 53.99  - 54  59.79   Trlycerides 0.0 -  149.0 mg/dL - 651.9  - 883  849.9   Hemoglobin A1c 4.8 - 5.6 % 7.5  7.5  6.9  7.5  -    Capillary Blood Glucose: Lab Results  Component Value Date   GLUCAP 162 (H) 02/16/2024   GLUCAP 118 (H) 11/25/2022   GLUCAP 116 (H) 11/25/2022   GLUCAP 132 (H) 11/25/2022   GLUCAP 111 (H) 11/19/2022     Exercise Target Goals: Exercise Program Goal: Individual exercise prescription set using results from initial 6 min walk test and THRR while considering  patients activity barriers and safety.   Exercise Prescription Goal: Starting with aerobic activity 30 plus minutes a day, 3 days per week for initial exercise prescription. Provide home exercise prescription and guidelines that participant acknowledges understanding prior to discharge.  Activity Barriers & Risk Stratification:  Activity Barriers & Cardiac Risk Stratification - 03/28/24 1303       Activity Barriers & Cardiac Risk Stratification   Activity Barriers Muscular Weakness;Deconditioning;Arthritis   right hip   Cardiac Risk Stratification Moderate          6 Minute Walk:  6 Minute Walk     Row Name 04/03/24 1404         6 Minute Walk   Phase Initial  Distance 1200 feet     Walk Time 6 minutes     # of Rest Breaks 0     MPH 2.27     METS 2.9     RPE 12     VO2 Peak 10.21     Symptoms No     Resting HR 66 bpm     Resting BP 170/82     Resting Oxygen Saturation  96 %     Exercise Oxygen Saturation  during 6 min walk 96 %     Max Ex. HR 98 bpm     Max Ex. BP 156/80     2 Minute Post BP 140/80        Oxygen Initial Assessment:   Oxygen Re-Evaluation:   Oxygen Discharge (Final Oxygen Re-Evaluation):   Initial Exercise Prescription:  Initial Exercise Prescription - 04/03/24 1400       Date of Initial Exercise RX and Referring Provider   Date 04/03/24    Referring Provider Kate Bruckner MD      Treadmill   MPH 1.5    Grade 0    Minutes 15    METs 2.15      REL-XR   Level 2    Speed  50    Minutes 15    METs 2.4      Prescription Details   Frequency (times per week) 2    Duration Progress to 30 minutes of continuous aerobic without signs/symptoms of physical distress      Intensity   THRR 40-80% of Max Heartrate 99-132    Ratings of Perceived Exertion 11-13      Resistance Training   Training Prescription Yes    Weight 5    Reps 10-15          Perform Capillary Blood Glucose checks as needed.  Exercise Prescription Changes:   Exercise Prescription Changes     Row Name 04/03/24 1400             Response to Exercise   Blood Pressure (Admit) 170/82       Blood Pressure (Exercise) 156/80       Blood Pressure (Exit) 140/80       Heart Rate (Admit) 66 bpm       Heart Rate (Exercise) 98 bpm       Heart Rate (Exit) 75 bpm       Oxygen Saturation (Admit) 96 %       Oxygen Saturation (Exercise) 96 %       Oxygen Saturation (Exit) 96 %       Rating of Perceived Exertion (Exercise) 12          Exercise Comments:   Exercise Comments     Row Name 03/28/24 1310 04/04/24 1329         Exercise Comments Frederic states  that he walks on the treadmill at home. First full day of exercise!  Patient was oriented to gym and equipment including functions, settings, policies, and procedures.  Patient's individual exercise prescription and treatment plan were reviewed.  All starting workloads were established based on the results of the 6 minute walk test done at initial orientation visit.  The plan for exercise progression was also introduced and progression will be customized based on patient's performance and goals.         Exercise Goals and Review:   Exercise Goals     Row Name 03/28/24 1310  Exercise Goals   Increase Physical Activity Yes       Intervention Provide advice, education, support and counseling about physical activity/exercise needs.;Develop an individualized exercise prescription for aerobic and resistive training based on  initial evaluation findings, risk stratification, comorbidities and participant's personal goals.       Expected Outcomes Short Term: Attend rehab on a regular basis to increase amount of physical activity.;Long Term: Exercising regularly at least 3-5 days a week.;Long Term: Add in home exercise to make exercise part of routine and to increase amount of physical activity.       Increase Strength and Stamina Yes       Intervention Provide advice, education, support and counseling about physical activity/exercise needs.;Develop an individualized exercise prescription for aerobic and resistive training based on initial evaluation findings, risk stratification, comorbidities and participant's personal goals.       Expected Outcomes Short Term: Increase workloads from initial exercise prescription for resistance, speed, and METs.;Short Term: Perform resistance training exercises routinely during rehab and add in resistance training at home;Long Term: Improve cardiorespiratory fitness, muscular endurance and strength as measured by increased METs and functional capacity ( )       Able to understand and use rate of perceived exertion (RPE) scale Yes       Intervention Provide education and explanation on how to use RPE scale       Expected Outcomes Short Term: Able to use RPE daily in rehab to express subjective intensity level;Long Term:  Able to use RPE to guide intensity level when exercising independently       Knowledge and understanding of Target Heart Rate Range (THRR) Yes       Intervention Provide education and explanation of THRR including how the numbers were predicted and where they are located for reference       Expected Outcomes Short Term: Able to state/look up THRR;Long Term: Able to use THRR to govern intensity when exercising independently;Short Term: Able to use daily as guideline for intensity in rehab       Able to check pulse independently Yes       Intervention Provide education and  demonstration on how to check pulse in carotid and radial arteries.;Review the importance of being able to check your own pulse for safety during independent exercise       Expected Outcomes Long Term: Able to check pulse independently and accurately;Short Term: Able to explain why pulse checking is important during independent exercise       Understanding of Exercise Prescription Yes       Intervention Provide education, explanation, and written materials on patient's individual exercise prescription       Expected Outcomes Short Term: Able to explain program exercise prescription;Long Term: Able to explain home exercise prescription to exercise independently          Exercise Goals Re-Evaluation :  Exercise Goals Re-Evaluation     Row Name 04/04/24 1330             Exercise Goal Re-Evaluation   Exercise Goals Review Able to understand and use rate of perceived exertion (RPE) scale;Knowledge and understanding of Target Heart Rate Range (THRR)       Comments Reviewed RPE and dyspnea scale, THR and program prescription with pt today.  Pt voiced understanding and was given a copy of goals to take home.       Expected Outcomes Short: Use RPE daily to regulate intensity.  Long: Follow program prescription in THR.  Discharge Exercise Prescription (Final Exercise Prescription Changes):  Exercise Prescription Changes - 04/03/24 1400       Response to Exercise   Blood Pressure (Admit) 170/82    Blood Pressure (Exercise) 156/80    Blood Pressure (Exit) 140/80    Heart Rate (Admit) 66 bpm    Heart Rate (Exercise) 98 bpm    Heart Rate (Exit) 75 bpm    Oxygen Saturation (Admit) 96 %    Oxygen Saturation (Exercise) 96 %    Oxygen Saturation (Exit) 96 %    Rating of Perceived Exertion (Exercise) 12          Nutrition:  Target Goals: Understanding of nutrition guidelines, daily intake of sodium 1500mg , cholesterol 200mg , calories 30% from fat and 7% or less from saturated  fats, daily to have 5 or more servings of fruits and vegetables.  Biometrics:  Pre Biometrics - 04/03/24 1407       Pre Biometrics   Height 5' 11 (1.803 m)    Weight 189 lb 2.5 oz (85.8 kg)    Waist Circumference 40 inches    Hip Circumference 42 inches    Waist to Hip Ratio 0.95 %    BMI (Calculated) 26.39    Grip Strength 27.7 kg    Single Leg Stand 23.46 seconds           Nutrition Therapy Plan and Nutrition Goals:  Nutrition Therapy & Goals - 03/28/24 1315       Intervention Plan   Intervention Prescribe, educate and counsel regarding individualized specific dietary modifications aiming towards targeted core components such as weight, hypertension, lipid management, diabetes, heart failure and other comorbidities.;Nutrition handout(s) given to patient.    Expected Outcomes Long Term Goal: Adherence to prescribed nutrition plan.;Short Term Goal: A plan has been developed with personal nutrition goals set during dietitian appointment.;Short Term Goal: Understand basic principles of dietary content, such as calories, fat, sodium, cholesterol and nutrients.          Nutrition Assessments:  MEDIFICTS Score Key: >=70 Need to make dietary changes  40-70 Heart Healthy Diet <= 40 Therapeutic Level Cholesterol Diet  Flowsheet Row CARDIAC REHAB PHASE II ORIENTATION from 04/03/2024 in [facility]  Picture Your Plate Total Score on Admission 62   Picture Your Plate Scores: <59 Unhealthy dietary pattern with much room for improvement. 41-50 Dietary pattern unlikely to meet recommendations for good health and room for improvement. 51-60 More healthful dietary pattern, with some room for improvement.  >60 Healthy dietary pattern, although there may be some specific behaviors that could be improved.    Nutrition Goals Re-Evaluation:   Nutrition Goals Discharge (Final Nutrition Goals Re-Evaluation):   Psychosocial: Target Goals: Acknowledge presence  or absence of significant depression and/or stress, maximize coping skills, provide positive support system. Participant is able to verbalize types and ability to use techniques and skills needed for reducing stress and depression.  Initial Review & Psychosocial Screening:  Initial Psych Review & Screening - 03/28/24 1315       Initial Review   Current issues with None Identified      Family Dynamics   Good Support System? Yes      Barriers   Psychosocial barriers to participate in program There are no identifiable barriers or psychosocial needs.      Screening Interventions   Interventions Encouraged to exercise    Expected Outcomes Long Term goal: The participant improves quality of Life and PHQ9 Scores as seen by post scores  and/or verbalization of changes;Short Term goal: Identification and review with participant of any Quality of Life or Depression concerns found by scoring the questionnaire.;Long Term Goal: Stressors or current issues are controlled or eliminated.;Short Term goal: Utilizing psychosocial counselor, staff and physician to assist with identification of specific Stressors or current issues interfering with healing process. Setting desired goal for each stressor or current issue identified.          Quality of Life Scores:  Quality of Life - 04/03/24 1429       Quality of Life   Select Quality of Life      Quality of Life Scores   Health/Function Pre 22.03 %    Socioeconomic Pre 23.38 %    Psych/Spiritual Pre 30 %    Family Pre 26.4 %    GLOBAL Pre 24.56 %         Scores of 19 and below usually indicate a poorer quality of life in these areas.  A difference of  2-3 points is a clinically meaningful difference.  A difference of 2-3 points in the total score of the Quality of Life Index has been associated with significant improvement in overall quality of life, self-image, physical symptoms, and general health in studies assessing change in quality of  life.  PHQ-9: Review Flowsheet  More data exists      04/03/2024 01/13/2024 12/30/2023 07/06/2023 12/22/2022  Depression screen PHQ 2/9  Decreased Interest 0 0 0 0 0  Down, Depressed, Hopeless 0 - 0 0 0  PHQ - 2 Score 0 0 0 0 0  Altered sleeping 0 - - - -  Tired, decreased energy 1 - - - -  Change in appetite 0 - - - -  Feeling bad or failure about yourself  0 - - - -  Trouble concentrating 0 - - - -  Moving slowly or fidgety/restless 0 - - - -  Suicidal thoughts 0 - - - -  PHQ-9 Score 1 - - - -  Difficult doing work/chores Not difficult at all - - - -   Interpretation of Total Score  Total Score Depression Severity:  1-4 = Minimal depression, 5-9 = Mild depression, 10-14 = Moderate depression, 15-19 = Moderately severe depression, 20-27 = Severe depression   Psychosocial Evaluation and Intervention:  Psychosocial Evaluation - 03/28/24 1315       Psychosocial Evaluation & Interventions   Interventions Encouraged to exercise with the program and follow exercise prescription    Comments Igor is a pleasant 73 year old male who is coming into cardiac rehab for S/P PTCA. He has went through the program before. He has his wife as his main support system. He currently exercises some at home by walking on the treadmill. He identifies no current stressors or sleep issues. He has a pacemaker and history of sleep apnea with wearing a CPAP at night. He is eager to start the program.    Expected Outcomes Short: Increase strength and stamina. Long: Get back in shape.    Continue Psychosocial Services  Follow up required by staff          Psychosocial Re-Evaluation:   Psychosocial Discharge (Final Psychosocial Re-Evaluation):   Vocational Rehabilitation: Provide vocational rehab assistance to qualifying candidates.   Vocational Rehab Evaluation & Intervention:  Vocational Rehab - 03/28/24 1305       Initial Vocational Rehab Evaluation & Intervention   Assessment shows need for  Vocational Rehabilitation No  Education: Education Goals: Education classes will be provided on a weekly basis, covering required topics. Participant will state understanding/return demonstration of topics presented.  Learning Barriers/Preferences:  Learning Barriers/Preferences - 03/28/24 1304       Learning Barriers/Preferences   Learning Barriers None    Learning Preferences None          Education Topics: Hypertension, Hypertension Reduction -Define heart disease and high blood pressure. Discus how high blood pressure affects the body and ways to reduce high blood pressure. Flowsheet Row CARDIAC REHAB PHASE II EXERCISE from 11/20/2020 in [place] [facility]  Date 10/23/20  Educator pb  Instruction Review Code 1- Verbalizes Understanding    Exercise and Your Heart -Discuss why it is important to exercise, the FITT principles of exercise, normal and abnormal responses to exercise, and how to exercise safely. Flowsheet Row CARDIAC REHAB PHASE II EXERCISE from 11/20/2020 in [employer] [employer] [place] [facility]  Date 10/30/20  Educator pb  Instruction Review Code 1- Verbalizes Understanding    Angina -Discuss definition of angina, causes of angina, treatment of angina, and how to decrease risk of having angina. Flowsheet Row CARDIAC REHAB PHASE II EXERCISE from 11/20/2020 in [place] [facility]  Date 11/06/20  Educator DF  Instruction Review Code 2- Demonstrated Understanding    Cardiac Medications -Review what the following cardiac medications are used for, how they affect the body, and side effects that may occur when taking the medications.  Medications include Aspirin , Beta blockers, calcium  channel blockers, ACE Inhibitors, angiotensin receptor blockers, diuretics, digoxin, and antihyperlipidemics. Flowsheet Row CARDIAC REHAB PHASE II EXERCISE from 11/20/2020 in [place] [facility]  Date 11/13/20  Educator pb  Instruction  Review Code 1- Verbalizes Understanding    Congestive Heart Failure -Discuss the definition of CHF, how to live with CHF, the signs and symptoms of CHF, and how keep track of weight and sodium intake. Flowsheet Row CARDIAC REHAB PHASE II EXERCISE from 11/20/2020 in [place] [facility]  Date 11/20/20  Educator pb  Instruction Review Code 1- Verbalizes Understanding    Heart Disease and Intimacy -Discus the effect sexual activity has on the heart, how changes occur during intimacy as we age, and safety during sexual activity.   Smoking Cessation / COPD -Discuss different methods to quit smoking, the health benefits of quitting smoking, and the definition of COPD.   Nutrition I: Fats -Discuss the types of cholesterol, what cholesterol does to the heart, and how cholesterol levels can be controlled.   Nutrition II: Labels -Discuss the different components of food labels and how to read food label   Heart Parts/Heart Disease and PAD -Discuss the anatomy of the heart, the pathway of blood circulation through the heart, and these are affected by heart disease.   Stress I: Signs and Symptoms -Discuss the causes of stress, how stress may lead to anxiety and depression, and ways to limit stress. Flowsheet Row CARDIAC REHAB PHASE II EXERCISE from 11/20/2020 in [place] [facility]  Date 10/02/20  Educator pb  Instruction Review Code 1- Verbalizes Understanding    Stress II: Relaxation -Discuss different types of relaxation techniques to limit stress.   Warning Signs of Stroke / TIA -Discuss definition of a stroke, what the signs and symptoms are of a stroke, and how to identify when someone is having stroke. Flowsheet Row CARDIAC REHAB PHASE II EXERCISE from 11/20/2020 in [place] [facility]  Date 10/16/20  Educator DL  Instruction Review Code 1- Verbalizes Understanding  Knowledge Questionnaire Score:  Knowledge Questionnaire Score -  04/03/24 1429       Knowledge Questionnaire Score   Pre Score 25/26          Core Components/Risk Factors/Patient Goals at Admission:  Personal Goals and Risk Factors at Admission - 03/28/24 1314       Core Components/Risk Factors/Patient Goals on Admission   Hypertension Yes    Intervention Provide education on lifestyle modifcations including regular physical activity/exercise, weight management, moderate sodium restriction and increased consumption of fresh fruit, vegetables, and low fat dairy, alcohol moderation, and smoking cessation.;Monitor prescription use compliance.    Expected Outcomes Short Term: Continued assessment and intervention until BP is < 140/70mm HG in hypertensive participants. < 130/49mm HG in hypertensive participants with diabetes, heart failure or chronic kidney disease.;Long Term: Maintenance of blood pressure at goal levels.    Lipids Yes    Intervention Provide education and support for participant on nutrition & aerobic/resistive exercise along with prescribed medications to achieve LDL 70mg , HDL >40mg .    Expected Outcomes Short Term: Participant states understanding of desired cholesterol values and is compliant with medications prescribed. Participant is following exercise prescription and nutrition guidelines.;Long Term: Cholesterol controlled with medications as prescribed, with individualized exercise RX and with personalized nutrition plan. Value goals: LDL < 70mg , HDL > 40 mg.          Core Components/Risk Factors/Patient Goals Review:    Core Components/Risk Factors/Patient Goals at Discharge (Final Review):    ITP Comments:  ITP Comments     Row Name 03/28/24 1314 04/03/24 1416 04/04/24 1329 04/18/24 1605     ITP Comments Completed virtual orientation today.  EP evaluation is scheduled for 04/03/24 at 0830 .  Documentation for diagnosis can be found in [facility] encounter 02/16/24. Patient arrived for 1st visit/orientation/education at 1300.  Patient was referred to CR by Dr. Lonni Nanas due to Status Post PTCA. During orientation advised patient on arrival and appointment times what to wear, what to do before, during and after exercise. Reviewed attendance and class policy.  Pt is scheduled to return Cardiac Rehab on 04/04/24 at 1330. Pt was advised to come to class 15 minutes before class starts.  Discussed RPE/Dpysnea scales. Patient participated in warm up stretches. Patient was able to complete 6 minute walk test.  Telemetry:NSR. Patient was measured for the equipment. Discussed equipment safety with patient. Took patient pre-anthropometric measurements. Patient finished visit at 1410. First full day of exercise!  Patient was oriented to gym and equipment including functions, settings, policies, and procedures.  Patient's individual exercise prescription and treatment plan were reviewed.  All starting workloads were established based on the results of the 6 minute walk test done at initial orientation visit.  The plan for exercise progression was also introduced and progression will be customized based on patient's performance and goals. 30 day review completed. ITP sent to Dr. Dorn Ross, Medical Director of Cardiac Rehab. Continue with ITP unless changes are made by physician.  New to program       Comments: 30 day review     [1]  Current Outpatient Medications:    Accu-Chek Softclix Lancets lancets, Use as instructed 1x a day, Disp: 100 each, Rfl: 3   acetaminophen  (TYLENOL ) 500 MG tablet, Take 1,000 mg by mouth every 6 (six) hours as needed (pain). , Disp: , Rfl:    albuterol  (VENTOLIN  HFA) 108 (90 Base) MCG/ACT inhaler, INHALE 2 PUFFS INTO THE LUNGS EVERY 6 HOURS AS NEEDED FOR  WHEEZE OR SHORTNESS OF BREATH (Patient taking differently: as needed.), Disp: 8.5 each, Rfl: 3   aspirin  EC 81 MG tablet, Take 1 tablet (81 mg total) by mouth daily. Swallow whole., Disp: , Rfl:    Blood Glucose Monitoring Suppl (ACCU-CHEK  GUIDE) w/Device KIT, Use as advised, Disp: 1 kit, Rfl: 0   clopidogrel  (PLAVIX ) 75 MG tablet, Take 1 tablet (75 mg total) by mouth daily with breakfast., Disp: 90 tablet, Rfl: 2   diclofenac (VOLTAREN) 75 MG EC tablet, Take 75 mg by mouth 2 (two) times daily. (Patient taking differently: Take 75 mg by mouth as needed.), Disp: , Rfl:    ezetimibe  (ZETIA ) 10 MG tablet, Take 1 tablet (10 mg total) by mouth daily., Disp: 90 tablet, Rfl: 3   famotidine  (PEPCID ) 20 MG tablet, Take 20 mg by mouth daily as needed for heartburn., Disp: , Rfl:    fenofibrate  (TRICOR ) 145 MG tablet, TAKE 1 TABLET BY MOUTH EVERY DAY (Patient not taking: Reported on 04/06/2024), Disp: 90 tablet, Rfl: 3   glucose blood test strip, Use as instructed 1x a day, Disp: 100 each, Rfl: 3   hydrochlorothiazide  (HYDRODIURIL ) 25 MG tablet, TAKE 1/2 TABLET (12.5 MG TOTAL) BY MOUTH DAILY AS NEEDED., Disp: 45 tablet, Rfl: 0   ipratropium (ATROVENT ) 0.06 % nasal spray, PLACE 2 SPRAYS INTO BOTH NOSTRILS 4 (FOUR) TIMES DAILY AS DIRECTED, Disp: 90 mL, Rfl: 1   metoprolol  tartrate (LOPRESSOR ) 25 MG tablet, Take 0.5 tablets (12.5 mg total) by mouth 2 (two) times daily., Disp: 90 tablet, Rfl: 3   Na Sulfate-K Sulfate-Mg Sulfate concentrate (SUPREP) 17.5-3.13-1.6 GM/177ML SOLN, TAKE 1 KIT (354 MLS TOTAL) BY MOUTH ONCE FOR 1 DOSE., Disp: , Rfl:    nitroGLYCERIN  (NITROSTAT ) 0.4 MG SL tablet, Place 1 tablet (0.4 mg total) under the tongue every 5 (five) minutes as needed for chest pain., Disp: 25 tablet, Rfl: 3   pantoprazole  (PROTONIX ) 40 MG tablet, Take 1 tablet (40 mg total) by mouth daily., Disp: 30 tablet, Rfl: 6   Pharmacist Choice Lancets MISC, Use to check blood sugar 1-2 times a day, Disp: 200 each, Rfl: 5   Propylene Glycol (SYSTANE COMPLETE) 0.6 % SOLN, Place 1 drop into both eyes daily., Disp: , Rfl:    rosuvastatin  (CRESTOR ) 20 MG tablet, Take 1 tablet (20 mg total) by mouth daily., Disp: 90 tablet, Rfl: 3   sildenafil  (VIAGRA ) 100 MG  tablet, Take 1 tablet by mouth prior to sexual activity as needed (Patient taking differently: as needed. Take 1 tablet by mouth prior to sexual activity as needed), Disp: 15 tablet, Rfl: 11 [2]  Social History Tobacco Use  Smoking Status Former   Current packs/day: 0.00   Average packs/day: 0.5 packs/day for 40.0 years (20.0 ttl pk-yrs)   Types: Cigarettes   Start date: 08/25/1971   Quit date: 08/25/2011   Years since quitting: 12.6  Smokeless Tobacco Never

## 2024-04-18 NOTE — Progress Notes (Signed)
 Daily Session Note  Patient Details  Name: Daniel Carey MRN: [id number] Date of Birth: Sep 05, 1950 Referring Provider:   Flowsheet Row CARDIAC REHAB PHASE II ORIENTATION from 04/03/2024 in [facility] CARDIAC REHABILITATION  Referring Provider Kate Bruckner MD    Encounter Date: 04/18/2024  Check In:  Session Check In - 04/18/24 1338       Check-In   Supervising physician immediately available to respond to emergencies See telemetry face sheet for immediately available MD    Location [facility]    Staff Present Laymon Rattler, BSN, RN, Lander Gear, RN;Macy Lingenfelter Zina, RN    Virtual Visit No    Medication changes reported     No    Fall or balance concerns reported    No    Warm-up and Cool-down Performed on first and last piece of equipment    Resistance Training Performed Yes    VAD Patient? No    PAD/SET Patient? No      Pain Assessment   Currently in Pain? No/denies          Capillary Blood Glucose: No results found for this or any previous visit (from the past 24 hours).    Tobacco Use History[1]  Goals Met:  Independence with exercise equipment Exercise tolerated well No report of concerns or symptoms today Strength training completed today  Goals Unmet:  Not Applicable  Comments: Pt able to follow exercise prescription today without complaint.  Will continue to monitor for progression.        [1]  Social History Tobacco Use  Smoking Status Former   Current packs/day: 0.00   Average packs/day: 0.5 packs/day for 40.0 years (20.0 ttl pk-yrs)   Types: Cigarettes   Start date: 08/25/1971   Quit date: 08/25/2011   Years since quitting: 12.6  Smokeless Tobacco Never

## 2024-04-25 ENCOUNTER — Encounter: Payer: Self-pay | Admitting: Internal Medicine

## 2024-04-25 ENCOUNTER — Encounter (HOSPITAL_COMMUNITY)
Admission: RE | Admit: 2024-04-25 | Discharge: 2024-04-25 | Disposition: A | Discharge status: Home / Self Care | Source: Ambulatory Visit | Attending: Cardiology | Admitting: Cardiology

## 2024-04-25 DIAGNOSIS — Z9861 Coronary angioplasty status: Secondary | ICD-10-CM | POA: Insufficient documentation

## 2024-04-25 NOTE — Progress Notes (Signed)
 Daily Session Note  Patient Details  Name: Daniel Carey MRN: [id number] Date of Birth: 1951-03-18 Referring Provider:   Flowsheet Row CARDIAC REHAB PHASE II ORIENTATION from 04/03/2024 in [facility] CARDIAC REHABILITATION  Referring Provider Kate Bruckner MD    Encounter Date: 04/25/2024  Check In:  Session Check In - 04/25/24 1315       Check-In   Supervising physician immediately available to respond to emergencies See telemetry face sheet for immediately available MD    Location [facility]    Staff Present Laymon Rattler, BSN, RN, WTA-C;Heather Con, BS, Exercise Physiologist;Victoria Zina, RN    Virtual Visit No    Medication changes reported     No    Fall or balance concerns reported    No    Tobacco Cessation No Change    Warm-up and Cool-down Performed on first and last piece of equipment    Resistance Training Performed Yes    VAD Patient? No    PAD/SET Patient? No      Pain Assessment   Currently in Pain? No/denies          Capillary Blood Glucose: No results found for this or any previous visit (from the past 24 hours).    Tobacco Use History[1]  Goals Met:  Independence with exercise equipment Exercise tolerated well No report of concerns or symptoms today Strength training completed today  Goals Unmet:  Not Applicable  Comments: Pt able to follow exercise prescription today without complaint.  Will continue to monitor for progression.        [1]  Social History Tobacco Use  Smoking Status Former   Current packs/day: 0.00   Average packs/day: 0.5 packs/day for 40.0 years (20.0 ttl pk-yrs)   Types: Cigarettes   Start date: 08/25/1971   Quit date: 08/25/2011   Years since quitting: 12.6  Smokeless Tobacco Never

## 2024-04-27 ENCOUNTER — Encounter (HOSPITAL_COMMUNITY)
Admission: RE | Admit: 2024-04-27 | Discharge: 2024-04-27 | Disposition: A | Discharge status: Home / Self Care | Source: Ambulatory Visit | Attending: Cardiology | Admitting: Cardiology

## 2024-04-27 DIAGNOSIS — Z9861 Coronary angioplasty status: Secondary | ICD-10-CM

## 2024-04-27 NOTE — Progress Notes (Signed)
 Daily Session Note  Patient Details  Name: DECLYN DELSOL MRN: [id number] Date of Birth: 06-10-1950 Referring Provider:   Flowsheet Row CARDIAC REHAB PHASE II ORIENTATION from 04/03/2024 in [facility] CARDIAC REHABILITATION  Referring Provider Kate Bruckner MD    Encounter Date: 04/27/2024  Check In:  Session Check In - 04/27/24 1339       Check-In   Supervising physician immediately available to respond to emergencies See telemetry face sheet for immediately available MD    Staff Present Rolland Sake BSN, RN;Sabastian Raimondi Con, BS, Exercise Physiologist;Jessica [place], MA, RCEP, CCRP, CCET    Virtual Visit No    Medication changes reported     No    Fall or balance concerns reported    No    Tobacco Cessation No Change    Warm-up and Cool-down Performed on first and last piece of equipment    Resistance Training Performed Yes    VAD Patient? No    PAD/SET Patient? No      Pain Assessment   Currently in Pain? No/denies    Multiple Pain Sites No          Capillary Blood Glucose: No results found for this or any previous visit (from the past 24 hours).    Tobacco Use History[1]  Goals Met:  Independence with exercise equipment Exercise tolerated well No report of concerns or symptoms today Strength training completed today  Goals Unmet:  Not Applicable  Comments: Pt able to follow exercise prescription today without complaint.  Will continue to monitor for progression.        [1]  Social History Tobacco Use  Smoking Status Former   Current packs/day: 0.00   Average packs/day: 0.5 packs/day for 40.0 years (20.0 ttl pk-yrs)   Types: Cigarettes   Start date: 08/25/1971   Quit date: 08/25/2011   Years since quitting: 12.6  Smokeless Tobacco Never

## 2024-04-28 ENCOUNTER — Ambulatory Visit: Admitting: Internal Medicine

## 2024-04-28 ENCOUNTER — Encounter: Payer: Self-pay | Admitting: Internal Medicine

## 2024-04-28 VITALS — BP 148/73 | HR 66 | Temp 97.0°F | Resp 16 | Ht 70.0 in | Wt 189.0 lb

## 2024-04-28 DIAGNOSIS — D124 Benign neoplasm of descending colon: Secondary | ICD-10-CM

## 2024-04-28 DIAGNOSIS — K648 Other hemorrhoids: Secondary | ICD-10-CM | POA: Diagnosis not present

## 2024-04-28 DIAGNOSIS — D123 Benign neoplasm of transverse colon: Secondary | ICD-10-CM

## 2024-04-28 DIAGNOSIS — D122 Benign neoplasm of ascending colon: Secondary | ICD-10-CM | POA: Diagnosis not present

## 2024-04-28 DIAGNOSIS — Z860101 Personal history of adenomatous and serrated colon polyps: Secondary | ICD-10-CM

## 2024-04-28 DIAGNOSIS — Z1211 Encounter for screening for malignant neoplasm of colon: Secondary | ICD-10-CM

## 2024-04-28 DIAGNOSIS — Z860100 Personal history of colon polyps, unspecified: Secondary | ICD-10-CM

## 2024-04-28 MED ORDER — SODIUM CHLORIDE 0.9 % IV SOLN: 500.0000 mL | INTRAVENOUS | Status: DC

## 2024-04-28 NOTE — Op Note (Signed)
 [facility] Patient Name: Marlo Goodrich Procedure Date: 04/28/2024 11:54 AM MRN: [id number] Endoscopist: Gordy CHRISTELLA Starch , MD, [id number] Age: 74 Referring MD:  Date of Birth: 11-20-1950 Gender: Male Account #: [id number] Procedure:                Colonoscopy Indications:              High risk colon cancer surveillance: Personal                            history of multiple adenomas, Last colonoscopy:                            November 2022 (TA x 4), Oct 2018 (TA x 5) Medicines:                Monitored Anesthesia Care Procedure:                Pre-Anesthesia Assessment:                           - Prior to the procedure, a History and Physical                            was performed, and patient medications and                            allergies were reviewed. The patient's tolerance of                            previous anesthesia was also reviewed. The risks                            and benefits of the procedure and the sedation                            options and risks were discussed with the patient.                            All questions were answered, and informed consent                            was obtained. Prior Anticoagulants: The patient has                            taken Plavix  (clopidogrel ), last dose was 5 days                            prior to procedure. ASA Grade Assessment: III - A                            patient with severe systemic disease. After                            reviewing the risks and benefits, the patient was  deemed in satisfactory condition to undergo the                            procedure.                           After obtaining informed consent, the colonoscope                            was passed under direct vision. Throughout the                            procedure, the patient's blood pressure, pulse, and                            oxygen saturations were monitored continuously. The                             CF [id number] #[id number] was introduced through the anus                            and advanced to the cecum, identified by                            appendiceal orifice and ileocecal valve. The                            quality of the bowel preparation was good. The                            ileocecal valve, appendiceal orifice, and rectum                            were photographed. Scope In: 1:35:57 PM Scope Out: 1:50:48 PM Scope Withdrawal Time: 0 hours 12 minutes 31 seconds  Total Procedure Duration: 0 hours 14 minutes 51 seconds  Findings:                 The digital rectal exam was normal.                           Three sessile polyps were found in the ascending                            colon. The polyps were 4 to 6 mm in size. These                            polyps were removed with a cold snare. Resection                            and retrieval were complete.                           A tattoo was seen in the proximal transverse colon.  A post-polypectomy scar was found at the tattoo                            site. There was no evidence of residual polyp                            tissue.                           Two sessile polyps were found in the transverse                            colon. The polyps were 4 to 5 mm in size. These                            polyps were removed with a cold snare. Resection                            and retrieval were complete.                           A 5 mm polyp was found in the descending colon. The                            polyp was sessile. The polyp was removed with a                            cold snare. Resection and retrieval were complete.                           Internal hemorrhoids were found during                            retroflexion. The hemorrhoids were small. Complications:            No immediate complications. Estimated Blood Loss:     Estimated blood loss  was minimal. Impression:               - Three 4 to 6 mm polyps in the ascending colon,                            removed with a cold snare. Resected and retrieved.                           - A tattoo was seen in the proximal transverse                            colon. A post-polypectomy scar was found at the                            tattoo site. There was no evidence of residual  polyp tissue.                           - Two 4 to 5 mm polyps in the transverse colon,                            removed with a cold snare. Resected and retrieved.                           - One 5 mm polyp in the descending colon, removed                            with a cold snare. Resected and retrieved.                           - Internal hemorrhoids. Recommendation:           - Patient has a contact number available for                            emergencies. The signs and symptoms of potential                            delayed complications were discussed with the                            patient. Return to normal activities tomorrow.                            Written discharge instructions were provided to the                            patient.                           - Resume previous diet.                           - Continue present medications.                           - Resume Plavix  (clopidogrel ) at prior dose                            tomorrow. Refer to managing physician for further                            adjustment of therapy.                           - Await pathology results.                           - Repeat colonoscopy is recommended for  surveillance. The colonoscopy date will be                            determined after pathology results from today's                            exam become available for review. Gordy CHRISTELLA Starch, MD 04/28/2024 2:02:58 PM This report has been signed electronically.

## 2024-04-28 NOTE — Progress Notes (Signed)
 Pt's states no medical or surgical changes since previsit or office visit.

## 2024-04-28 NOTE — Progress Notes (Signed)
 "   GASTROENTEROLOGY PROCEDURE H&P NOTE   Primary Care Physician: Kennyth Worth HERO, MD    Reason for Procedure:  History of multiple adenomatous colon polyps  Plan:    Surveillance colonoscopy  Patient is appropriate for endoscopic procedure(s) in the ambulatory ([facility]) setting.  The nature of the procedure, as well as the risks, benefits, and alternatives were carefully and thoroughly reviewed with the patient. Ample time for discussion and questions allowed.  All questions were answered. The patient understood, was satisfied, and agreed with the plan to proceed.    HPI: Daniel Carey is a 74 y.o. male who presents for colonoscopy.  Medical history as below.  Tolerated the prep.  No recent chest pain or shortness of breath.  No abdominal pain today.  Plavix  on hold x 5 days as cleared by cardiology.  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    noted on EKG March 2013   CAD (coronary artery disease)    s/p atherectomy, PTCA/DES x2 mid & distal RCA 06/30/12   Carotid artery occlusion    Chest pain    Chronic kidney disease    Stone   Complication of anesthesia    heart rate dropping with propofol  last procedure   COPD (chronic obstructive pulmonary disease) (HCC)    Dyspnea    ED (erectile dysfunction)    Esophageal stricture    External hemorrhoids without mention of complication    GERD (gastroesophageal reflux disease)    Hiatal hernia    History of kidney stones    HLD (hyperlipidemia)    HTN (hypertension)    Myocardial infarction (HCC)    1996   Personal history of colonic polyps 03/12/2010   TUBULAR ADENOMA   Pneumonia    Presence of permanent cardiac pacemaker    Sleep apnea    wears CPAP   Tobacco abuse    Tubular adenoma of colon    Type 2 diabetes mellitus (HCC)     Past Surgical History:  Procedure Laterality Date   ANGIOPLASTY     X3   ANTERIOR CERVICAL DECOMP/DISCECTOMY FUSION N/A 11/25/2022    Procedure: Anterior Cervical Decompression/Discectomy Fusion Cervical Three-Four;  Surgeon: Debby Dorn MATSU, MD;  Location: [facility];  Service: Neurosurgery;  Laterality: N/A;   CARDIAC CATHETERIZATION     8 years ago stents   COLONOSCOPY N/A 02/16/2017   Procedure: COLONOSCOPY;  Surgeon: Albertus Gordy HERO, MD;  Location: [facility];  Service: Gastroenterology;  Laterality: N/A;   COLONOSCOPY     CORONARY ARTERY BYPASS GRAFT N/A 07/19/2020   Procedure: CORONARY ARTERY BYPASS GRAFTING (CABG) TIMES THREE USING LEFT INTERNAL MAMMARY ARTERY AND RIGHT GREATER SAPHENOUS VEIN HARVESTED ENDOSCOPICALLY;  Surgeon: Lucas Dorise POUR, MD;  Location: [facility] OR;  Service: Open Heart Surgery;  Laterality: N/A;   CORONARY BALLOON ANGIOPLASTY N/A 02/16/2024   Procedure: CORONARY BALLOON ANGIOPLASTY;  Surgeon: Anner Alm ORN, MD;  Location: [facility] INVASIVE CV LAB;  Service: Cardiovascular;  Laterality: N/A;   CORONARY IMAGING/OCT N/A 02/16/2024   Procedure: CORONARY IMAGING/OCT;  Surgeon: Anner Alm ORN, MD;  Location: [facility] INVASIVE CV LAB;  Service: Cardiovascular;  Laterality: N/A;   INSERT / REPLACE / REMOVE PACEMAKER  06/20/2018   LEFT HEART CATH AND CORONARY ANGIOGRAPHY N/A 11/05/2016   Procedure: Left Heart Cath and Coronary Angiography;  Surgeon: Mady Bruckner, MD;  Location: [facility] INVASIVE CV LAB;  Service: Cardiovascular;  Laterality: N/A;  LEFT HEART CATH AND CORONARY ANGIOGRAPHY N/A 07/12/2020   Procedure: LEFT HEART CATH AND CORONARY ANGIOGRAPHY;  Surgeon: Verlin Lonni BIRCH, MD;  Location: [facility] INVASIVE CV LAB;  Service: Cardiovascular;  Laterality: N/A;   LEFT HEART CATH AND CORS/GRAFTS ANGIOGRAPHY N/A 02/16/2024   Procedure: LEFT HEART CATH AND CORS/GRAFTS ANGIOGRAPHY;  Surgeon: Anner Alm ORN, MD;  Location: [facility] INVASIVE CV LAB;  Service: Cardiovascular;  Laterality: N/A;   LUMBAR LAMINECTOMY/DECOMPRESSION MICRODISCECTOMY N/A 05/12/2018   Procedure: Microlumbar decompression L2-3, possible L1-2;   Surgeon: Duwayne Purchase, MD;  Location: [facility] OR;  Service: Orthopedics;  Laterality: N/A;  2 hrs, Microlumbar decompression L2-3, possible L1-2   PACEMAKER IMPLANT N/A 06/20/2018   Procedure: PACEMAKER IMPLANT;  Surgeon: Waddell Danelle ORN, MD;  Location: [facility] INVASIVE CV LAB;  Service: Cardiovascular;  Laterality: N/A;   PERCUTANEOUS CORONARY ROTOBLATOR INTERVENTION (PCI-R) N/A 06/30/2012   Procedure: PERCUTANEOUS CORONARY ROTOBLATOR INTERVENTION (PCI-R);  Surgeon: Lonni BIRCH Verlin, MD;  Location: [facility] CATH LAB;  Service: Cardiovascular;  Laterality: N/A;   TEE WITHOUT CARDIOVERSION N/A 07/19/2020   Procedure: TRANSESOPHAGEAL ECHOCARDIOGRAM (TEE);  Surgeon: Lucas Dorise POUR, MD;  Location: [facility] OR;  Service: Open Heart Surgery;  Laterality: N/A;   VIDEO BRONCHOSCOPY  08/13/2011   Procedure: VIDEO BRONCHOSCOPY WITHOUT FLUORO;  Surgeon: Ozell KATHEE America, MD;  Location: [facility];  Service: Cardiopulmonary;  Laterality: Bilateral;    Prior to Admission medications  Medication Sig Start Date End Date Taking? Authorizing Provider  aspirin  EC 81 MG tablet Take 1 tablet (81 mg total) by mouth daily. Swallow whole. 12/02/22  Yes Johnanna Credit Caylin, PA-C  ezetimibe  (ZETIA ) 10 MG tablet Take 1 tablet (10 mg total) by mouth daily. 11/23/23  Yes Kate Lonni CROME, MD  hydrochlorothiazide  (HYDRODIURIL ) 25 MG tablet TAKE 1/2 TABLET (12.5 MG TOTAL) BY MOUTH DAILY AS NEEDED. 02/15/24  Yes Waddell Danelle ORN, MD  ipratropium (ATROVENT ) 0.06 % nasal spray PLACE 2 SPRAYS INTO BOTH NOSTRILS 4 (FOUR) TIMES DAILY AS DIRECTED 02/09/24  Yes Kennyth Worth HERO, MD  metoprolol  tartrate (LOPRESSOR ) 25 MG tablet Take 0.5 tablets (12.5 mg total) by mouth 2 (two) times daily. 03/14/24 06/12/24 Yes Kate Lonni CROME, MD  pantoprazole  (PROTONIX ) 40 MG tablet Take 1 tablet (40 mg total) by mouth daily. 02/16/24 02/15/25 Yes Henry Manuelita KATHEE, NP  rosuvastatin  (CRESTOR ) 20 MG tablet Take 1 tablet (20 mg total) by mouth daily.  03/14/24  Yes Kate Lonni CROME, MD  Accu-Chek Softclix Lancets lancets Use as instructed 1x a day 10/29/23   Trixie File, MD  acetaminophen  (TYLENOL ) 500 MG tablet Take 1,000 mg by mouth every 6 (six) hours as needed (pain).     [provider]  albuterol  (VENTOLIN  HFA) 108 (90 Base) MCG/ACT inhaler INHALE 2 PUFFS INTO THE LUNGS EVERY 6 HOURS AS NEEDED FOR WHEEZE OR SHORTNESS OF BREATH Patient taking differently: as needed. 03/03/24   Kennyth Worth HERO, MD  Blood Glucose Monitoring Suppl (ACCU-CHEK GUIDE) w/Device KIT Use as advised 10/29/23   Trixie File, MD  clopidogrel  (PLAVIX ) 75 MG tablet Take 1 tablet (75 mg total) by mouth daily with breakfast. 02/17/24   Henry Manuelita KATHEE, NP  diclofenac (VOLTAREN) 75 MG EC tablet Take 75 mg by mouth 2 (two) times daily. Patient taking differently: Take 75 mg by mouth as needed.    [provider]  famotidine  (PEPCID ) 20 MG tablet Take 20 mg by mouth daily as needed for heartburn.    [provider]  fenofibrate  (TRICOR ) 145 MG tablet  TAKE 1 TABLET BY MOUTH EVERY DAY Patient not taking: Reported on 04/06/2024 05/03/23   Waddell Danelle ORN, MD  glucose blood test strip Use as instructed 1x a day 10/29/23   Trixie File, MD  nitroGLYCERIN  (NITROSTAT ) 0.4 MG SL tablet Place 1 tablet (0.4 mg total) under the tongue every 5 (five) minutes as needed for chest pain. 11/23/23   Kate Lonni CROME, MD  Pharmacist Choice Lancets MISC Use to check blood sugar 1-2 times a day 04/20/23   Trixie File, MD  Propylene Glycol (SYSTANE COMPLETE) 0.6 % SOLN Place 1 drop into both eyes daily.    [provider]  sildenafil  (VIAGRA ) 100 MG tablet Take 1 tablet by mouth prior to sexual activity as needed Patient taking differently: as needed. Take 1 tablet by mouth prior to sexual activity as needed 11/16/22   Nahser, Aleene PARAS, MD    Current Outpatient Medications  Medication Sig Dispense Refill   aspirin  EC 81 MG  tablet Take 1 tablet (81 mg total) by mouth daily. Swallow whole.     ezetimibe  (ZETIA ) 10 MG tablet Take 1 tablet (10 mg total) by mouth daily. 90 tablet 3   hydrochlorothiazide  (HYDRODIURIL ) 25 MG tablet TAKE 1/2 TABLET (12.5 MG TOTAL) BY MOUTH DAILY AS NEEDED. 45 tablet 0   ipratropium (ATROVENT ) 0.06 % nasal spray PLACE 2 SPRAYS INTO BOTH NOSTRILS 4 (FOUR) TIMES DAILY AS DIRECTED 90 mL 1   metoprolol  tartrate (LOPRESSOR ) 25 MG tablet Take 0.5 tablets (12.5 mg total) by mouth 2 (two) times daily. 90 tablet 3   pantoprazole  (PROTONIX ) 40 MG tablet Take 1 tablet (40 mg total) by mouth daily. 30 tablet 6   rosuvastatin  (CRESTOR ) 20 MG tablet Take 1 tablet (20 mg total) by mouth daily. 90 tablet 3   Accu-Chek Softclix Lancets lancets Use as instructed 1x a day 100 each 3   acetaminophen  (TYLENOL ) 500 MG tablet Take 1,000 mg by mouth every 6 (six) hours as needed (pain).      albuterol  (VENTOLIN  HFA) 108 (90 Base) MCG/ACT inhaler INHALE 2 PUFFS INTO THE LUNGS EVERY 6 HOURS AS NEEDED FOR WHEEZE OR SHORTNESS OF BREATH (Patient taking differently: as needed.) 8.5 each 3   Blood Glucose Monitoring Suppl (ACCU-CHEK GUIDE) w/Device KIT Use as advised 1 kit 0   clopidogrel  (PLAVIX ) 75 MG tablet Take 1 tablet (75 mg total) by mouth daily with breakfast. 90 tablet 2   diclofenac (VOLTAREN) 75 MG EC tablet Take 75 mg by mouth 2 (two) times daily. (Patient taking differently: Take 75 mg by mouth as needed.)     famotidine  (PEPCID ) 20 MG tablet Take 20 mg by mouth daily as needed for heartburn.     fenofibrate  (TRICOR ) 145 MG tablet TAKE 1 TABLET BY MOUTH EVERY DAY (Patient not taking: Reported on 04/06/2024) 90 tablet 3   glucose blood test strip Use as instructed 1x a day 100 each 3   nitroGLYCERIN  (NITROSTAT ) 0.4 MG SL tablet Place 1 tablet (0.4 mg total) under the tongue every 5 (five) minutes as needed for chest pain. 25 tablet 3   Pharmacist Choice Lancets MISC Use to check blood sugar 1-2 times a day 200  each 5   Propylene Glycol (SYSTANE COMPLETE) 0.6 % SOLN Place 1 drop into both eyes daily.     sildenafil  (VIAGRA ) 100 MG tablet Take 1 tablet by mouth prior to sexual activity as needed (Patient taking differently: as needed. Take 1 tablet by mouth prior to sexual activity as  needed) 15 tablet 11   Current Facility-Administered Medications  Medication Dose Route Frequency Provider Last Rate Last Admin   0.9 %  sodium chloride  infusion  500 mL Intravenous Continuous Georga Stys, Gordy HERO, MD        Allergies as of 04/28/2024 - Review Complete 04/28/2024  Allergen Reaction Noted   Amlodipine besylate Other (See Comments) 12/30/2006   Propofol  Other (See Comments) 06/28/2012   Lisinopril  Cough and Rash 12/05/2010   Rosuvastatin  Other (See Comments) 02/13/2010    Family History  Problem Relation Age of Onset   Diabetes Mother        and brother    Kidney failure Mother    Heart disease Father        hx of MI   Breast cancer Sister    Colon cancer Neg Hx    Stomach cancer Neg Hx    Esophageal cancer Neg Hx    Rectal cancer Neg Hx    Colon polyps Neg Hx     Social History   Socioeconomic History   Marital status: Married    Spouse name: Not on file   Number of children: 1   Years of education: Not on file   Highest education level: GED or equivalent  Occupational History   Occupation: Regulatory Affairs Officer  Tobacco Use   Smoking status: Former    Current packs/day: 0.00    Average packs/day: 0.5 packs/day for 40.0 years (20.0 ttl pk-yrs)    Types: Cigarettes    Start date: 08/25/1971    Quit date: 08/25/2011    Years since quitting: 12.6   Smokeless tobacco: Never  Vaping Use   Vaping status: Never Used  Substance and Sexual Activity   Alcohol use: Not Currently   Drug use: No   Sexual activity: Not Currently  Other Topics Concern   Not on file  Social History Narrative   Not on file   Social Drivers of Health   Tobacco Use: Medium Risk (04/28/2024)   Patient History     Smoking Tobacco Use: Former    Smokeless Tobacco Use: Never    Passive Exposure: Not on file  Financial Resource Strain: Low Risk (12/27/2023)   Overall Financial Resource Strain (CARDIA)    Difficulty of Paying Living Expenses: Not very hard  Food Insecurity: Patient Declined (12/27/2023)   Epic    Worried About Programme Researcher, Broadcasting/film/video in the Last Year: Patient declined    Barista in the Last Year: Patient declined  Transportation Needs: No Transportation Needs (12/27/2023)   Epic    Lack of Transportation (Medical): No    Lack of Transportation (Non-Medical): No  Physical Activity: Sufficiently Active (12/27/2023)   Exercise Vital Sign    Days of Exercise per Week: 4 days    Minutes of Exercise per Session: 40 min  Stress: No Stress Concern Present (12/27/2023)   [employer] of Occupational Health - Occupational Stress Questionnaire    Feeling of Stress: Not at all  Social Connections: Moderately Isolated (12/27/2023)   Social Connection and Isolation Panel    Frequency of Communication with Friends and Family: Three times a week    Frequency of Social Gatherings with Friends and Family: Once a week    Attends Religious Services: Patient declined    Database Administrator or Organizations: No    Attends Engineer, Structural: Not on file    Marital Status: Married  Catering Manager Violence: Not on file  Depression (  PHQ2-9): Low Risk (04/03/2024)   Depression (PHQ2-9)    PHQ-2 Score: 1  Alcohol Screen: Not on file  Housing: Unknown (12/27/2023)   Epic    Unable to Pay for Housing in the Last Year: No    Number of Times Moved in the Last Year: Not on file    Homeless in the Last Year: No  Utilities: Low Risk (07/16/2022)   Received from [facility]   Utilities    In the past 12 months has the electric, gas, oil, or water  company threatened to shut off services in your home? : No  Health Literacy: Not on file    Physical Exam: Vital signs in last 24 hours: @BP   135/66 (BP Location: Left Arm, Patient Position: Sitting, Cuff Size: Normal)   Pulse 61   Temp (!) 97 F (36.1 C)   Ht 5' 10 (1.778 m)   Wt 189 lb (85.7 kg)   SpO2 98%   BMI 27.12 kg/m  GEN: NAD EYE: Sclerae anicteric ENT: MMM CV: Non-tachycardic Pulm: CTA b/l GI: Soft, NT/ND NEURO:  Alert & Oriented x 3   Gordy Starch, MD [facility]  04/28/2024 1:22 PM  "

## 2024-04-28 NOTE — Patient Instructions (Signed)
" ° °  Resume Plavix  at prior dose tomorrow (04/29/24)  Continue previous diet & medications  Handouts on polyps & hemorrhoids given to you today.   Await pathology results on polyps removed   See Dr Albertus back in office in March per order Dr Albertus- see page 3     YOU HAD AN ENDOSCOPIC PROCEDURE TODAY AT THE [facility]:   Refer to the procedure report that was given to you for any specific questions about what was found during the examination.  If the procedure report does not answer your questions, please call your gastroenterologist to clarify.  If you requested that your care partner not be given the details of your procedure findings, then the procedure report has been included in a sealed envelope for you to review at your convenience later.  YOU SHOULD EXPECT: Some feelings of bloating in the abdomen. Passage of more gas than usual.  Walking can help get rid of the air that was put into your GI tract during the procedure and reduce the bloating. If you had a lower endoscopy (such as a colonoscopy or flexible sigmoidoscopy) you may notice spotting of blood in your stool or on the toilet paper. If you underwent a bowel prep for your procedure, you may not have a normal bowel movement for a few days.  Please Note:  You might notice some irritation and congestion in your nose or some drainage.  This is from the oxygen used during your procedure.  There is no need for concern and it should clear up in a day or so.  SYMPTOMS TO REPORT IMMEDIATELY:  Following lower endoscopy (colonoscopy or flexible sigmoidoscopy):  Excessive amounts of blood in the stool  Significant tenderness or worsening of abdominal pains  Swelling of the abdomen that is new, acute  Fever of 100F or higher   For urgent or emergent issues, a gastroenterologist can be reached at any hour by calling [phone]. Do not use MyChart messaging for urgent concerns.    DIET:  We do recommend a small meal at  first, but then you may proceed to your regular diet.  Drink plenty of fluids but you should avoid alcoholic beverages for 24 hours.  ACTIVITY:  You should plan to take it easy for the rest of today and you should NOT DRIVE or use heavy machinery until tomorrow (because of the sedation medicines used during the test).    FOLLOW UP: Our staff will call the number listed on your records the next business day following your procedure.  We will call around 7:15- 8:00 am to check on you and address any questions or concerns that you may have regarding the information given to you following your procedure. If we do not reach you, we will leave a message.     If any biopsies were taken you will be contacted by phone or by letter within the next 1-3 weeks.  Please call us  at [phone] if you have not heard about the biopsies in 3 weeks.    SIGNATURES/CONFIDENTIALITY: You and/or your care partner have signed paperwork which will be entered into your electronic medical record.  These signatures attest to the fact that that the information above on your After Visit Summary has been reviewed and is understood.  Full responsibility of the confidentiality of this discharge information lies with you and/or your care-partner.   "

## 2024-04-28 NOTE — Progress Notes (Signed)
 Called to room to assist during endoscopic procedure.  Patient ID and intended procedure confirmed with present staff. Received instructions for my participation in the procedure from the performing physician.

## 2024-04-28 NOTE — Progress Notes (Signed)
 To pacu, VSS. Report to [email or web address]

## 2024-05-01 ENCOUNTER — Telehealth: Payer: Self-pay

## 2024-05-01 NOTE — Telephone Encounter (Signed)
" °  Follow up Call-     04/28/2024   12:24 PM  Call back number  Post procedure Call Back phone  # [phone]  Permission to leave phone message Yes     Patient questions:  Do you have a fever, pain , or abdominal swelling? No. Pain Score  0 *  Have you tolerated food without any problems? Yes.    Have you been able to return to your normal activities? Yes.    Do you have any questions about your discharge instructions: Diet   No. Medications  No. Follow up visit  No.  Do you have questions or concerns about your Care? No.  Actions: * If pain score is 4 or above: No action needed, pain <4.   "

## 2024-05-01 NOTE — Progress Notes (Signed)
 Daniel Carey                                          MRN: [id number]   05/01/2024   The [facility] Quality Team Specialist reviewed this patient medical record for the purposes of chart review for care gap closure. The following were reviewed: abstraction for care gap closure-glycemic status assessment.    [facility] Quality Team

## 2024-05-02 ENCOUNTER — Encounter: Payer: Self-pay | Admitting: Internal Medicine

## 2024-05-02 ENCOUNTER — Ambulatory Visit: Admitting: Internal Medicine

## 2024-05-02 ENCOUNTER — Encounter (HOSPITAL_COMMUNITY)

## 2024-05-02 VITALS — BP 120/74 | HR 69 | Ht 70.0 in | Wt 189.0 lb

## 2024-05-02 DIAGNOSIS — E7849 Other hyperlipidemia: Secondary | ICD-10-CM | POA: Diagnosis not present

## 2024-05-02 DIAGNOSIS — E039 Hypothyroidism, unspecified: Secondary | ICD-10-CM

## 2024-05-02 DIAGNOSIS — E1165 Type 2 diabetes mellitus with hyperglycemia: Secondary | ICD-10-CM

## 2024-05-02 LAB — POCT GLYCOSYLATED HEMOGLOBIN (HGB A1C): Hemoglobin A1C: 7.4 % — AB (ref 4.0–5.6)

## 2024-05-02 NOTE — Patient Instructions (Addendum)
 Please check blood sugars 1x a day, rotating check times.  Please return in 4 months with your sugar log.

## 2024-05-02 NOTE — Progress Notes (Signed)
 Patient ID: HUNT ZAJICEK, male   DOB: 1950/10/10, 74 y.o.   MRN: [id number]  HPI  SHAYMUS EVELETH is a 74 y.o.-year-old male, returning for f/u for hypothyroidism, dx 2012 and DM2, diet-controlled, non-insulin -dependent, with complications (CAD - s/p CABG 07/2020, carotid atherosclerosis).  Last visit 6 months ago.  Interim history: No increased urination, blurry vision, nausea. At the beginning of last year, he had cervical vertebral fracture >> had cervical decompression surgery 11/25/2022.  He recovered well after surgery, but he was not able to exercise.  Before last visit, he was able to start back exercise, being more active and playing golf, and also cut down on nighttime snacking.  He lost 5 pounds before last visit.   He had a heart cath 01/2024. One of the bypasses collapsed despite a stent >> this was opened up. He has exercise chest discomfort after 7-10 min - he attributes this to hiatal hernia. He will see Dr. Albertus. He had a colonoscopy recently. He is in cardiac rehab right now twice a week.  He also exercises at home.  Pt. was dx with hypothyroidism in 2012 >> he was on levothyroxine  up to 200 mcg in 2016 >> but did not feel well so he stopped. Afterwards, his TFTs fluctuated but in the last 3 years they were mostly in the normal range.  Reviewed previous TFTs: Lab Results  Component Value Date   TSH 3.69 12/30/2023   TSH 5.380 (H) 11/15/2023   TSH 3.85 07/06/2023   TSH 3.79 04/01/2023   TSH 5.14 05/29/2022   TSH 3.73 06/25/2021   TSH 5.070 (H) 03/07/2021   TSH 3.96 03/20/2020   TSH 3.83 08/22/2019   TSH 3.99 01/31/2018   FREET4 1.0 04/01/2023   FREET4 0.85 05/29/2022   FREET4 1.04 03/07/2021   FREET4 0.75 03/20/2020   FREET4 0.80 01/31/2018   FREET4 0.65 08/02/2017   FREET4 0.83 12/08/2016   FREET4 0.67 03/06/2016   FREET4 0.66 09/05/2015   FREET4 1.25 05/07/2015    Lab Results  Component Value Date   T3FREE 3.5 04/01/2023   T3FREE 2.6 05/29/2022   T3FREE  2.7 03/07/2021   T3FREE 3.0 03/20/2020   T3FREE 2.8 01/31/2018   T3FREE 2.6 08/02/2017   T3FREE 3.3 12/08/2016   T3FREE 3.1 03/06/2016   T3FREE 3.2 09/05/2015   T3FREE 3.9 05/07/2015   Pt denies: - feeling nodules in neck - hoarseness - dysphagia - choking   DM2:  Reviewed HbA1c levels: Lab Results  Component Value Date   HGBA1C 7.5 (H) 11/15/2023   HGBA1C 6.9 (A) 10/29/2023   HGBA1C 7.5 (H) 07/06/2023  10/29/2023: HbA1c 6.9% 07/03/2016: HbA1c 6.8%  He was on: - Metformin  500 mg 2x a day >> stopped 2/2 nausea  - Metformin  ER 1000 mg with dinner >> nausea  Plan on: - Metformin  ER 500 mg twice a day with meals >> 3x a day -he had nausea with taking 1000 mg at the same time >> stopped 08/2021  He checks his sugars 2x a month: - am:135 >> 132, 133, 149 >> 143 >> 112-140 >> 120-140 - 2h after b'fast: n/c >> 148, 162 >> 170 >> n/c >> 122 - lunch:106-111 >> 97, 113 >> n/c >> 85-102 >> n/c - 2h after lunch: 81, 126, 173 >> 100-146 >> 88-140 >> 139, 214 - dinner: 94, 114  >> 97-115, 146 >> 110 >> n/c - 2h after dinner: n/c >> <144 >> n/c - bedtime: n/c Feels sugars <80. Lowest: 94 >>.SABRASABRA  65 >> 120  Highest: 200 >> .SABRA. 144 >> 200s  Meter: ReliOn >> ReliOn Premier Blu >> Verio Reflect  + CKD. Last BUN/Cr: Lab Results  Component Value Date   BUN 16 02/10/2024   BUN 21 12/30/2023   BUN 17 11/15/2023   CREATININE 1.22 02/10/2024   CREATININE 1.20 12/30/2023   CREATININE 1.20 11/15/2023   Lab Results  Component Value Date   MICRALBCREAT 49 (H) 10/29/2023  He is off ACE inhibitor/ARB due to history of high potassium.  + HL: Lab Results  Component Value Date   CHOL 116 12/30/2023   HDL 40.20 12/30/2023   LDLCALC 46 12/30/2023   LDLDIRECT 107.0 02/27/2013   TRIG 150.0 (H) 12/30/2023   CHOLHDL 3 12/30/2023  On Crestor  20, changed from Niacin  >> Zetia  10 mg daily.   Last eye exam in 04/2023: reportedly No DR, no glaucoma, + incipient cataracts. [facility].  Last foot exam 03/2023.  He sees Dr Alveta >> had angioplasy x3 in the past, and 2 stents placed. He he has a history of an AMI 77 (mild). He has a Mobitz 1 AVB. Had a catheterization in 10/2016 >> partial blockages. He has OSA and wears a CPAP. He had a colonoscopy 02/2017 >> E coli sepsis.  ROS: + See HPI  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    noted on EKG March 2013   CAD (coronary artery disease)    s/p atherectomy, PTCA/DES x2 mid & distal RCA 06/30/12   Carotid artery occlusion    Chest pain    Chronic kidney disease    Stone   Complication of anesthesia    heart rate dropping with propofol  last procedure   COPD (chronic obstructive pulmonary disease) (HCC)    Dyspnea    ED (erectile dysfunction)    Esophageal stricture    External hemorrhoids without mention of complication    GERD (gastroesophageal reflux disease)    Hiatal hernia    History of kidney stones    HLD (hyperlipidemia)    HTN (hypertension)    Myocardial infarction (HCC)    1996   Personal history of colonic polyps 03/12/2010   TUBULAR ADENOMA   Pneumonia    Presence of permanent cardiac pacemaker    Sleep apnea    wears CPAP   Tobacco abuse    Tubular adenoma of colon    Type 2 diabetes mellitus (HCC)    Past Surgical History:  Procedure Laterality Date   ANGIOPLASTY     X3   ANTERIOR CERVICAL DECOMP/DISCECTOMY FUSION N/A 11/25/2022   Procedure: Anterior Cervical Decompression/Discectomy Fusion Cervical Three-Four;  Surgeon: Debby Dorn MATSU, MD;  Location: [facility] OR;  Service: Neurosurgery;  Laterality: N/A;   CARDIAC CATHETERIZATION     8 years ago stents   COLONOSCOPY N/A 02/16/2017   Procedure: COLONOSCOPY;  Surgeon: Albertus Gordy HERO, MD;  Location: [facility] ENDOSCOPY;  Service: Gastroenterology;  Laterality: N/A;   COLONOSCOPY     CORONARY ARTERY BYPASS GRAFT N/A 07/19/2020   Procedure: CORONARY ARTERY BYPASS GRAFTING (CABG) TIMES  THREE USING LEFT INTERNAL MAMMARY ARTERY AND RIGHT GREATER SAPHENOUS VEIN HARVESTED ENDOSCOPICALLY;  Surgeon: Lucas Dorise POUR, MD;  Location: [facility] OR;  Service: Open Heart Surgery;  Laterality: N/A;   CORONARY BALLOON ANGIOPLASTY N/A 02/16/2024   Procedure: CORONARY BALLOON ANGIOPLASTY;  Surgeon: Anner Alm ORN, MD;  Location: [facility] INVASIVE CV LAB;  Service: Cardiovascular;  Laterality: N/A;   CORONARY IMAGING/OCT N/A 02/16/2024   Procedure: CORONARY IMAGING/OCT;  Surgeon: Anner Alm ORN, MD;  Location: [facility] INVASIVE CV LAB;  Service: Cardiovascular;  Laterality: N/A;   INSERT / REPLACE / REMOVE PACEMAKER  06/20/2018   LEFT HEART CATH AND CORONARY ANGIOGRAPHY N/A 11/05/2016   Procedure: Left Heart Cath and Coronary Angiography;  Surgeon: Mady Bruckner, MD;  Location: [facility] INVASIVE CV LAB;  Service: Cardiovascular;  Laterality: N/A;   LEFT HEART CATH AND CORONARY ANGIOGRAPHY N/A 07/12/2020   Procedure: LEFT HEART CATH AND CORONARY ANGIOGRAPHY;  Surgeon: Verlin Bruckner BIRCH, MD;  Location: [facility] INVASIVE CV LAB;  Service: Cardiovascular;  Laterality: N/A;   LEFT HEART CATH AND CORS/GRAFTS ANGIOGRAPHY N/A 02/16/2024   Procedure: LEFT HEART CATH AND CORS/GRAFTS ANGIOGRAPHY;  Surgeon: Anner Alm ORN, MD;  Location: [facility] INVASIVE CV LAB;  Service: Cardiovascular;  Laterality: N/A;   LUMBAR LAMINECTOMY/DECOMPRESSION MICRODISCECTOMY N/A 05/12/2018   Procedure: Microlumbar decompression L2-3, possible L1-2;  Surgeon: Duwayne Purchase, MD;  Location: [facility] OR;  Service: Orthopedics;  Laterality: N/A;  2 hrs, Microlumbar decompression L2-3, possible L1-2   PACEMAKER IMPLANT N/A 06/20/2018   Procedure: PACEMAKER IMPLANT;  Surgeon: Waddell Danelle ORN, MD;  Location: [facility] INVASIVE CV LAB;  Service: Cardiovascular;  Laterality: N/A;   PERCUTANEOUS CORONARY ROTOBLATOR INTERVENTION (PCI-R) N/A 06/30/2012   Procedure: PERCUTANEOUS CORONARY ROTOBLATOR INTERVENTION (PCI-R);  Surgeon: Bruckner BIRCH Verlin, MD;  Location: [facility]  CATH LAB;  Service: Cardiovascular;  Laterality: N/A;   TEE WITHOUT CARDIOVERSION N/A 07/19/2020   Procedure: TRANSESOPHAGEAL ECHOCARDIOGRAM (TEE);  Surgeon: Lucas Dorise POUR, MD;  Location: [facility] OR;  Service: Open Heart Surgery;  Laterality: N/A;   VIDEO BRONCHOSCOPY  08/13/2011   Procedure: VIDEO BRONCHOSCOPY WITHOUT FLUORO;  Surgeon: Ozell KATHEE America, MD;  Location: [facility];  Service: Cardiopulmonary;  Laterality: Bilateral;   History   Social History   Marital Status: Married    Spouse Name: N/A    Number of Children: 1   Occupational History   Regulatory Affairs Officer    Social History Main Topics   Smoking status: Former Smoker -- 0.50 packs/day for 40 years    Types: Cigarettes    Quit date: 08/25/2011   Smokeless tobacco: Never Used   Alcohol Use: 0.0 oz/week     Comment: rare   Drug Use: No   Current Outpatient Medications on File Prior to Visit  Medication Sig Dispense Refill   Accu-Chek Softclix Lancets lancets Use as instructed 1x a day 100 each 3   acetaminophen  (TYLENOL ) 500 MG tablet Take 1,000 mg by mouth every 6 (six) hours as needed (pain).      albuterol  (VENTOLIN  HFA) 108 (90 Base) MCG/ACT inhaler INHALE 2 PUFFS INTO THE LUNGS EVERY 6 HOURS AS NEEDED FOR WHEEZE OR SHORTNESS OF BREATH (Patient taking differently: as needed.) 8.5 each 3   aspirin  EC 81 MG tablet Take 1 tablet (81 mg total) by mouth daily. Swallow whole.     Blood Glucose Monitoring Suppl (ACCU-CHEK GUIDE) w/Device KIT Use as advised 1 kit 0   clopidogrel  (PLAVIX ) 75 MG tablet Take 1 tablet (75 mg total) by mouth daily with breakfast. 90 tablet 2   diclofenac (VOLTAREN) 75 MG EC tablet Take 75 mg by mouth 2 (two) times daily. (Patient taking differently: Take 75 mg by mouth as needed.)     ezetimibe  (ZETIA ) 10 MG tablet Take 1 tablet (10 mg total) by mouth daily. 90 tablet 3   famotidine  (PEPCID ) 20 MG tablet Take  20 mg by mouth daily as needed for heartburn.     fenofibrate  (TRICOR ) 145 MG tablet  TAKE 1 TABLET BY MOUTH EVERY DAY (Patient not taking: Reported on 04/06/2024) 90 tablet 3   glucose blood test strip Use as instructed 1x a day 100 each 3   hydrochlorothiazide  (HYDRODIURIL ) 25 MG tablet TAKE 1/2 TABLET (12.5 MG TOTAL) BY MOUTH DAILY AS NEEDED. 45 tablet 0   ipratropium (ATROVENT ) 0.06 % nasal spray PLACE 2 SPRAYS INTO BOTH NOSTRILS 4 (FOUR) TIMES DAILY AS DIRECTED 90 mL 1   metoprolol  tartrate (LOPRESSOR ) 25 MG tablet Take 0.5 tablets (12.5 mg total) by mouth 2 (two) times daily. 90 tablet 3   nitroGLYCERIN  (NITROSTAT ) 0.4 MG SL tablet Place 1 tablet (0.4 mg total) under the tongue every 5 (five) minutes as needed for chest pain. 25 tablet 3   pantoprazole  (PROTONIX ) 40 MG tablet Take 1 tablet (40 mg total) by mouth daily. 30 tablet 6   Pharmacist Choice Lancets MISC Use to check blood sugar 1-2 times a day 200 each 5   Propylene Glycol (SYSTANE COMPLETE) 0.6 % SOLN Place 1 drop into both eyes daily.     rosuvastatin  (CRESTOR ) 20 MG tablet Take 1 tablet (20 mg total) by mouth daily. 90 tablet 3   sildenafil  (VIAGRA ) 100 MG tablet Take 1 tablet by mouth prior to sexual activity as needed (Patient taking differently: as needed. Take 1 tablet by mouth prior to sexual activity as needed) 15 tablet 11   No current facility-administered medications on file prior to visit.   Allergies  Allergen Reactions   Amlodipine Besylate Other (See Comments)    Headache, weakness, dizziness   Propofol  Other (See Comments)    Heart rate dropped     Lisinopril  Cough and Rash   Rosuvastatin  Other (See Comments)     Corncern for muscle aches but he is tolerating now    Family History  Problem Relation Age of Onset   Diabetes Mother        and brother    Kidney failure Mother    Heart disease Father        hx of MI   Breast cancer Sister    Colon cancer Neg Hx    Stomach cancer Neg Hx    Esophageal cancer Neg Hx    Rectal cancer Neg Hx    Colon polyps Neg Hx    PE: BP 120/74    Pulse 69   Ht 5' 10 (1.778 m)   Wt 189 lb (85.7 kg)   SpO2 98%   BMI 27.12 kg/m  Wt Readings from Last 10 Encounters:  05/02/24 189 lb (85.7 kg)  04/28/24 189 lb (85.7 kg)  04/06/24 189 lb (85.7 kg)  04/03/24 189 lb 2.5 oz (85.8 kg)  03/29/24 189 lb 3.2 oz (85.8 kg)  03/14/24 187 lb 3.2 oz (84.9 kg)  02/16/24 184 lb (83.5 kg)  02/10/24 183 lb (83 kg)  01/13/24 182 lb (82.6 kg)  01/06/24 185 lb 4 oz (84 kg)   Constitutional: Slightly overweight, in NAD Eyes: no exophthalmos ENT: no masses palpated in neck, no cervical lymphadenopathy Cardiovascular: RRR, No MRG Respiratory: CTA B Musculoskeletal: no deformities Skin:  no rashes Neurological: no tremor with outstretched hands Diabetic Foot Exam - Simple   Simple Foot Form Diabetic Foot exam was performed with the following findings: Yes 05/02/2024  3:23 PM  Visual Inspection No deformities, no ulcerations, no other skin breakdown bilaterally: Yes Sensation Testing Intact  to touch and monofilament testing bilaterally: Yes Pulse Check Posterior Tibialis and Dorsalis pulse intact bilaterally: Yes Comments + B onychodystrophy    ASSESSMENT: 1. Mild hypothyroidism  2. DM2, diet- controlled - CAD, s/p CABG 07/19/2020 - Carotid atherosclerosis - per carotid ultrasound 08/2021  3. HL  PLAN:  1. Patient with history of mild hypothyroidism, not on levothyroxine  - A TSH was normal at last visit: Lab Results  Component Value Date   TSH 3.69 12/30/2023  - He prefers to continue without levothyroxine  if possible - He has no complaints today - Plan to recheck his TSH at next visit  2. DM2 -Patient with uncontrolled type 2 diabetes, which was controlled in the spring 2022 when HbA1c returned elevated at 7.3%.  At that time, he was started on a low-dose metformin  in the hospital, which she could not tolerate due to nausea.  He did not tolerate the extended release formulation of metformin  afterwards but he stopped in 2023 and  did not want to restart it.   - Afterwards, his blood sugars were mostly at goal, with only occasional mild high values in the morning.  Before last visit, he adjusted his diet and was not snacking at night and he was also more active, playing golf.  HbA1c was better, it 6.9% so we did not have to add any medications.  I strongly advised him to continue with lifestyle changes.  He had another HbA1c 2 weeks after our last visit which was higher, at 7.5%. - At today's visit he is not checking sugars consistently.  He only has 2 values in his meter in the last 2 months.  We discussed that this is not enough, especially as his HbA1c levels are still above target and he does have a blood sugar in the 200s in the last month and a half.  He is very reticent to start diabetic medications but he does ask about what other medicines are on the market except for metformin .  We did discuss about GLP-1 receptor agonist and SGLT2 inhibitors.  He would not want to start any of these for now.  We did discuss about the absolute need to check blood sugars consistently, to understand trends and he is willing to start doing so.  He also agrees to come back sooner than 6 months to reevaluate his HbA1c and diabetes control.  We did discuss about possible complications of uncontrolled diabetes, especially as he already had cardiac surgery. -At today's visit his wife mentions that she was not aware that he actually has diabetes.  We discussed that we are following him for diabetes even if he is is off diabetic medications.  I explained that an HbA1c higher than 6.5% is consistent with diabetes. - I advised him to: Patient Instructions  Please check blood sugars 1x a day, rotating check times.  Please return in 4 months with your sugar log.   - we checked his HbA1c: 7.4% (slightly lower) - advised for yearly eye exams >> he is UTD -At last visit, he had a slightly elevated ACR, at 49.  We will repeat this at next visit.  If still  elevated, he may need an ACE inhibitor/ARB or an SGLT2 inhibitor.  Of note, I previously did recommend an SGLT2 inhibitor for diabetes control in the past and discussed about benefits of this class of medications but he declined to decrease medication burden.  He again declined adding new medicines today. - return to clinic in 6 months  3.  Hyperlipidemia - Latest lipid panel showed all fractions at goal: Lab Results  Component Value Date   CHOL 116 12/30/2023   HDL 40.20 12/30/2023   LDLCALC 46 12/30/2023   LDLDIRECT 107.0 02/27/2013   TRIG 150.0 (H) 12/30/2023   CHOLHDL 3 12/30/2023  - He continues on Crestor  20 mg daily, and Zetia  10 mg daily with good tolerance.  He stopped his fenofibrate  after he started Zetia .  Lela Fendt, MD PhD [facility] Endocrinology

## 2024-05-03 ENCOUNTER — Ambulatory Visit (HOSPITAL_COMMUNITY)
Admission: RE | Admit: 2024-05-03 | Discharge: 2024-05-03 | Disposition: A | Discharge status: Home / Self Care | Source: Ambulatory Visit | Attending: Vascular Surgery | Admitting: Vascular Surgery

## 2024-05-03 ENCOUNTER — Ambulatory Visit: Payer: Self-pay | Admitting: Internal Medicine

## 2024-05-03 ENCOUNTER — Ambulatory Visit: Admitting: Physician Assistant

## 2024-05-03 VITALS — BP 113/74 | HR 65 | Temp 97.7°F | Wt 187.6 lb

## 2024-05-03 DIAGNOSIS — I6523 Occlusion and stenosis of bilateral carotid arteries: Secondary | ICD-10-CM | POA: Diagnosis not present

## 2024-05-03 LAB — SURGICAL PATHOLOGY

## 2024-05-03 NOTE — Progress Notes (Signed)
 " HISTORY AND PHYSICAL     CC:  follow up. Requesting Provider:  Kennyth Worth HERO, MD  HPI: This is a 74 y.o. male here for follow up for carotid artery stenosis.   Pt was last seen 10/27/2023 and at that time he was without neurological sx.  He had 60-79% bilateral ICA stenosis.    He has hx of CABG and PPM.    Pt returns today for follow up.    Pt denies any amaurosis fugax, speech difficulties, weakness, numbness, paralysis or clumsiness or facial droop.    He states he has had some chest discomfort with exercise.  He would rest, it would resolve and not return.  He does have a hiatal hernia and there is plan for EGD in the future.  He has also been followed by cardiology for this as well and had a cardiac cath in October 2025.  He has hx of CT myelogram about a year ago.   The pt is on a statin for cholesterol management.  The pt is on a daily aspirin .   Other AC:  Plavix  The pt is on BB, diuretic for hypertension.   The pt is not  on medication for diabetes Tobacco hx:  former  Pt does not have family hx of AAA.  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    noted on EKG March 2013   CAD (coronary artery disease)    s/p atherectomy, PTCA/DES x2 mid & distal RCA 06/30/12   Carotid artery occlusion    Chest pain    Chronic kidney disease    Stone   Complication of anesthesia    heart rate dropping with propofol  last procedure   COPD (chronic obstructive pulmonary disease) (HCC)    Dyspnea    ED (erectile dysfunction)    Esophageal stricture    External hemorrhoids without mention of complication    GERD (gastroesophageal reflux disease)    Hiatal hernia    History of kidney stones    HLD (hyperlipidemia)    HTN (hypertension)    Myocardial infarction (HCC)    1996   Personal history of colonic polyps 03/12/2010   TUBULAR ADENOMA   Pneumonia    Presence of permanent cardiac pacemaker    Sleep apnea    wears CPAP    Tobacco abuse    Tubular adenoma of colon    Type 2 diabetes mellitus (HCC)     Past Surgical History:  Procedure Laterality Date   ANGIOPLASTY     X3   ANTERIOR CERVICAL DECOMP/DISCECTOMY FUSION N/A 11/25/2022   Procedure: Anterior Cervical Decompression/Discectomy Fusion Cervical Three-Four;  Surgeon: Debby Dorn MATSU, MD;  Location: [facility] OR;  Service: Neurosurgery;  Laterality: N/A;   CARDIAC CATHETERIZATION     8 years ago stents   COLONOSCOPY N/A 02/16/2017   Procedure: COLONOSCOPY;  Surgeon: Albertus Gordy HERO, MD;  Location: [facility];  Service: Gastroenterology;  Laterality: N/A;   COLONOSCOPY     CORONARY ARTERY BYPASS GRAFT N/A 07/19/2020   Procedure: CORONARY ARTERY BYPASS GRAFTING (CABG) TIMES THREE USING LEFT INTERNAL MAMMARY ARTERY AND RIGHT GREATER SAPHENOUS VEIN HARVESTED ENDOSCOPICALLY;  Surgeon: Lucas Dorise POUR, MD;  Location: [facility] OR;  Service: Open Heart Surgery;  Laterality: N/A;   CORONARY BALLOON ANGIOPLASTY N/A 02/16/2024   Procedure: CORONARY BALLOON ANGIOPLASTY;  Surgeon: Anner Alm ORN, MD;  Location: [facility] INVASIVE CV LAB;  Service: Cardiovascular;  Laterality:  N/A;   CORONARY IMAGING/OCT N/A 02/16/2024   Procedure: CORONARY IMAGING/OCT;  Surgeon: Anner Alm ORN, MD;  Location: [facility] INVASIVE CV LAB;  Service: Cardiovascular;  Laterality: N/A;   INSERT / REPLACE / REMOVE PACEMAKER  06/20/2018   LEFT HEART CATH AND CORONARY ANGIOGRAPHY N/A 11/05/2016   Procedure: Left Heart Cath and Coronary Angiography;  Surgeon: Mady Bruckner, MD;  Location: [facility] INVASIVE CV LAB;  Service: Cardiovascular;  Laterality: N/A;   LEFT HEART CATH AND CORONARY ANGIOGRAPHY N/A 07/12/2020   Procedure: LEFT HEART CATH AND CORONARY ANGIOGRAPHY;  Surgeon: Verlin Bruckner BIRCH, MD;  Location: [facility] INVASIVE CV LAB;  Service: Cardiovascular;  Laterality: N/A;   LEFT HEART CATH AND CORS/GRAFTS ANGIOGRAPHY N/A 02/16/2024   Procedure: LEFT HEART CATH AND CORS/GRAFTS ANGIOGRAPHY;  Surgeon: Anner Alm ORN, MD;  Location: [facility] INVASIVE CV LAB;  Service: Cardiovascular;  Laterality: N/A;   LUMBAR LAMINECTOMY/DECOMPRESSION MICRODISCECTOMY N/A 05/12/2018   Procedure: Microlumbar decompression L2-3, possible L1-2;  Surgeon: Duwayne Purchase, MD;  Location: [facility] OR;  Service: Orthopedics;  Laterality: N/A;  2 hrs, Microlumbar decompression L2-3, possible L1-2   PACEMAKER IMPLANT N/A 06/20/2018   Procedure: PACEMAKER IMPLANT;  Surgeon: Waddell Danelle ORN, MD;  Location: [facility] INVASIVE CV LAB;  Service: Cardiovascular;  Laterality: N/A;   PERCUTANEOUS CORONARY ROTOBLATOR INTERVENTION (PCI-R) N/A 06/30/2012   Procedure: PERCUTANEOUS CORONARY ROTOBLATOR INTERVENTION (PCI-R);  Surgeon: Bruckner BIRCH Verlin, MD;  Location: [facility] CATH LAB;  Service: Cardiovascular;  Laterality: N/A;   TEE WITHOUT CARDIOVERSION N/A 07/19/2020   Procedure: TRANSESOPHAGEAL ECHOCARDIOGRAM (TEE);  Surgeon: Lucas Dorise POUR, MD;  Location: [facility] OR;  Service: Open Heart Surgery;  Laterality: N/A;   VIDEO BRONCHOSCOPY  08/13/2011   Procedure: VIDEO BRONCHOSCOPY WITHOUT FLUORO;  Surgeon: Ozell KATHEE America, MD;  Location: [facility];  Service: Cardiopulmonary;  Laterality: Bilateral;    Allergies[1]  Current Outpatient Medications  Medication Sig Dispense Refill   Accu-Chek Softclix Lancets lancets Use as instructed 1x a day 100 each 3   acetaminophen  (TYLENOL ) 500 MG tablet Take 1,000 mg by mouth every 6 (six) hours as needed (pain).      albuterol  (VENTOLIN  HFA) 108 (90 Base) MCG/ACT inhaler INHALE 2 PUFFS INTO THE LUNGS EVERY 6 HOURS AS NEEDED FOR WHEEZE OR SHORTNESS OF BREATH (Patient taking differently: as needed.) 8.5 each 3   aspirin  EC 81 MG tablet Take 1 tablet (81 mg total) by mouth daily. Swallow whole.     Blood Glucose Monitoring Suppl (ACCU-CHEK GUIDE) w/Device KIT Use as advised 1 kit 0   clopidogrel  (PLAVIX ) 75 MG tablet Take 1 tablet (75 mg total) by mouth daily with breakfast. 90 tablet 2   diclofenac (VOLTAREN) 75 MG EC  tablet Take 75 mg by mouth 2 (two) times daily. (Patient taking differently: Take 75 mg by mouth as needed.)     ezetimibe  (ZETIA ) 10 MG tablet Take 1 tablet (10 mg total) by mouth daily. 90 tablet 3   famotidine  (PEPCID ) 20 MG tablet Take 20 mg by mouth daily as needed for heartburn. (Patient not taking: Reported on 05/02/2024)     fenofibrate  (TRICOR ) 145 MG tablet TAKE 1 TABLET BY MOUTH EVERY DAY 90 tablet 3   glucose blood test strip Use as instructed 1x a day 100 each 3   hydrochlorothiazide  (HYDRODIURIL ) 25 MG tablet TAKE 1/2 TABLET (12.5 MG TOTAL) BY MOUTH DAILY AS NEEDED. 45 tablet 0   ipratropium (ATROVENT ) 0.06 % nasal spray PLACE 2 SPRAYS INTO BOTH NOSTRILS 4 (FOUR) TIMES DAILY AS  DIRECTED 90 mL 1   metoprolol  tartrate (LOPRESSOR ) 25 MG tablet Take 0.5 tablets (12.5 mg total) by mouth 2 (two) times daily. 90 tablet 3   nitroGLYCERIN  (NITROSTAT ) 0.4 MG SL tablet Place 1 tablet (0.4 mg total) under the tongue every 5 (five) minutes as needed for chest pain. 25 tablet 3   pantoprazole  (PROTONIX ) 40 MG tablet Take 1 tablet (40 mg total) by mouth daily. 30 tablet 6   Pharmacist Choice Lancets MISC Use to check blood sugar 1-2 times a day 200 each 5   Propylene Glycol (SYSTANE COMPLETE) 0.6 % SOLN Place 1 drop into both eyes daily. (Patient not taking: Reported on 05/02/2024)     rosuvastatin  (CRESTOR ) 20 MG tablet Take 1 tablet (20 mg total) by mouth daily. 90 tablet 3   sildenafil  (VIAGRA ) 100 MG tablet Take 1 tablet by mouth prior to sexual activity as needed (Patient taking differently: as needed. Take 1 tablet by mouth prior to sexual activity as needed) 15 tablet 11   No current facility-administered medications for this visit.    Family History  Problem Relation Age of Onset   Diabetes Mother        and brother    Kidney failure Mother    Heart disease Father        hx of MI   Breast cancer Sister    Colon cancer Neg Hx    Stomach cancer Neg Hx    Esophageal cancer Neg Hx     Rectal cancer Neg Hx    Colon polyps Neg Hx     Social History   Socioeconomic History   Marital status: Married    Spouse name: Not on file   Number of children: 1   Years of education: Not on file   Highest education level: GED or equivalent  Occupational History   Occupation: Regulatory Affairs Officer  Tobacco Use   Smoking status: Former    Current packs/day: 0.00    Average packs/day: 0.5 packs/day for 40.0 years (20.0 ttl pk-yrs)    Types: Cigarettes    Start date: 08/25/1971    Quit date: 08/25/2011    Years since quitting: 12.6   Smokeless tobacco: Never  Vaping Use   Vaping status: Never Used  Substance and Sexual Activity   Alcohol use: Not Currently   Drug use: No   Sexual activity: Not Currently  Other Topics Concern   Not on file  Social History Narrative   Not on file   Social Drivers of Health   Tobacco Use: Medium Risk (05/02/2024)   Patient History    Smoking Tobacco Use: Former    Smokeless Tobacco Use: Never    Passive Exposure: Not on file  Financial Resource Strain: Low Risk (12/27/2023)   Overall Financial Resource Strain (CARDIA)    Difficulty of Paying Living Expenses: Not very hard  Food Insecurity: Patient Declined (12/27/2023)   Epic    Worried About Programme Researcher, Broadcasting/film/video in the Last Year: Patient declined    Barista in the Last Year: Patient declined  Transportation Needs: No Transportation Needs (12/27/2023)   Epic    Lack of Transportation (Medical): No    Lack of Transportation (Non-Medical): No  Physical Activity: Sufficiently Active (12/27/2023)   Exercise Vital Sign    Days of Exercise per Week: 4 days    Minutes of Exercise per Session: 40 min  Stress: No Stress Concern Present (12/27/2023)   [employer] of Occupational Health -  Occupational Stress Questionnaire    Feeling of Stress: Not at all  Social Connections: Moderately Isolated (12/27/2023)   Social Connection and Isolation Panel    Frequency of Communication with Friends  and Family: Three times a week    Frequency of Social Gatherings with Friends and Family: Once a week    Attends Religious Services: Patient declined    Active Member of Clubs or Organizations: No    Attends Engineer, Structural: Not on file    Marital Status: Married  Catering Manager Violence: Not on file  Depression (PHQ2-9): Low Risk (04/03/2024)   Depression (PHQ2-9)    PHQ-2 Score: 1  Alcohol Screen: Not on file  Housing: Unknown (12/27/2023)   Epic    Unable to Pay for Housing in the Last Year: No    Number of Times Moved in the Last Year: Not on file    Homeless in the Last Year: No  Utilities: Low Risk (07/16/2022)   Received from [facility]   Utilities    In the past 12 months has the electric, gas, oil, or water  company threatened to shut off services in your home? : No  Health Literacy: Not on file     REVIEW OF SYSTEMS:   [X]  denotes positive finding, [ ]  denotes negative finding Cardiac  Comments:  Chest pain or chest pressure: x See HPI  Shortness of breath upon exertion:    Short of breath when lying flat:    Irregular heart rhythm:        Vascular    Pain in calf, thigh, or hip brought on by ambulation:    Pain in feet at night that wakes you up from your sleep:     Blood clot in your veins:    Leg swelling:         Pulmonary    Oxygen at home:    Productive cough:     Wheezing:         Neurologic    Sudden weakness in arms or legs:     Sudden numbness in arms or legs:     Sudden onset of difficulty speaking or slurred speech:    Temporary loss of vision in one eye:     Problems with dizziness:         Gastrointestinal    Blood in stool:     Vomited blood:         Genitourinary    Burning when urinating:     Blood in urine:        Psychiatric    Major depression:         Hematologic    Bleeding problems:    Problems with blood clotting too easily:        Skin    Rashes or ulcers:        Constitutional    Fever or chills:       PHYSICAL EXAMINATION:  Today's Vitals   05/03/24 1214 05/03/24 1217  BP: 121/78 113/74  Pulse: 65 65  Temp: 97.7 F (36.5 C)   TempSrc: Temporal   Weight: 187 lb 9.6 oz (85.1 kg)   PainSc: 0-No pain    Body mass index is 26.92 kg/m.   General:  WDWN in NAD; vital signs documented above Gait: Not observed HENT: WNL, normocephalic Pulmonary: normal non-labored breathing Cardiac: regular HR, without carotid bruits Abdomen: soft, NT; aortic pulse is not palpable Skin: without rashes Vascular Exam/Pulses: Palpable radial pulses bilaterally Extremities:  without open wounds Musculoskeletal: no muscle wasting or atrophy  Neurologic: A&O X 3; moving all extremities equally; speech is fluent/normal Psychiatric:  The pt has Normal affect.   Non-Invasive Vascular Imaging:   Carotid Duplex on 05/03/2024 Right:  40-59% ICA stenosis Left:  1-39% ICA stenosis Hemodynamically significant plaque >50% visualized in the  distal CCA/ bifurcation/proximal ICA. Difficult to dileneate ICA  involvement due to disease and shadowing.   Vertebrals:  Bilateral vertebral arteries demonstrate antegrade flow.  Subclavians: Normal flow hemodynamics were seen in bilateral subclavian arteries   Previous Carotid duplex on 10/27/2023: Right: 60-79% ICA stenosis Left:   60-79% ICA stenosis    ASSESSMENT/PLAN:: 74 y.o. male here for follow up carotid artery stenosis and has hx of bilateral 60-79% ICA stenosis  -pt asymptomatic -duplex today is significantly different than 6 months ago, but similar to one year ago.  He does have elevated velocities of 311/102 in the left CCA but was difficult to delineate from the ICA.  Given the above, discussed with pt and wife we will get CTA head/neck for definitive degree of stenosis.    -discussed s/s of stroke with pt and he understands should he develop any of these sx, he will go to the nearest ER or call 911. -pt will f/u with Dr. Sheree in 4-6 weeks to go  over results of CTA.   -pt will call sooner should he have any issues. -continue statin/asa/plavix -he has been off plavix  for colonoscopy but will restart next week.    Lucie Apt, [facility] [phone]  Clinic MD:  Sheree     [1]  Allergies Allergen Reactions   Amlodipine Besylate Other (See Comments)    Headache, weakness, dizziness   Propofol  Other (See Comments)    Heart rate dropped     Lisinopril  Cough and Rash   Rosuvastatin  Other (See Comments)     Corncern for muscle aches but he is tolerating now    "

## 2024-05-04 ENCOUNTER — Other Ambulatory Visit: Payer: Self-pay | Admitting: *Deleted

## 2024-05-04 ENCOUNTER — Other Ambulatory Visit: Payer: Self-pay | Admitting: Vascular Surgery

## 2024-05-04 ENCOUNTER — Encounter: Payer: Self-pay | Admitting: Vascular Surgery

## 2024-05-04 ENCOUNTER — Encounter (HOSPITAL_COMMUNITY)
Admission: RE | Admit: 2024-05-04 | Discharge: 2024-05-04 | Disposition: A | Source: Ambulatory Visit | Attending: Cardiology

## 2024-05-04 DIAGNOSIS — Z9861 Coronary angioplasty status: Secondary | ICD-10-CM

## 2024-05-04 DIAGNOSIS — I6523 Occlusion and stenosis of bilateral carotid arteries: Secondary | ICD-10-CM

## 2024-05-04 NOTE — Progress Notes (Signed)
 Daily Session Note  Patient Details  Name: Daniel Carey MRN: [id number] Date of Birth: 11-16-1950 Referring Provider:   Flowsheet Row CARDIAC REHAB PHASE II ORIENTATION from 04/03/2024 in [facility] CARDIAC REHABILITATION  Referring Provider Kate Bruckner MD    Encounter Date: 05/04/2024  Check In:  Session Check In - 05/04/24 1329       Check-In   Supervising physician immediately available to respond to emergencies See telemetry face sheet for immediately available MD    Location [facility]    Staff Present Rolland Sake BSN, RN;Maksym Pfiffner Vicci, RN, BSN;Jessica Hawkins, MA, RCEP, CCRP, CCET    Virtual Visit No    Medication changes reported     No    Fall or balance concerns reported    No    Warm-up and Cool-down Performed on first and last piece of equipment    Resistance Training Performed Yes    VAD Patient? No    PAD/SET Patient? No      Pain Assessment   Currently in Pain? No/denies    Pain Score 0-No pain    Multiple Pain Sites No          Capillary Blood Glucose: No results found for this or any previous visit (from the past 24 hours).    Tobacco Use History[1]  Goals Met:  Independence with exercise equipment Exercise tolerated well No report of concerns or symptoms today Strength training completed today  Goals Unmet:  Not Applicable  Comments: Pt able to follow exercise prescription today without complaint.  Will continue to monitor for progression.        [1]  Social History Tobacco Use  Smoking Status Former   Current packs/day: 0.00   Average packs/day: 0.5 packs/day for 40.0 years (20.0 ttl pk-yrs)   Types: Cigarettes   Start date: 08/25/1971   Quit date: 08/25/2011   Years since quitting: 12.7  Smokeless Tobacco Never

## 2024-05-09 ENCOUNTER — Encounter (HOSPITAL_COMMUNITY)
Admission: RE | Admit: 2024-05-09 | Discharge: 2024-05-09 | Disposition: A | Source: Ambulatory Visit | Attending: Cardiology

## 2024-05-09 DIAGNOSIS — Z9861 Coronary angioplasty status: Secondary | ICD-10-CM

## 2024-05-09 NOTE — Progress Notes (Signed)
 Daily Session Note  Patient Details  Name: Daniel Carey MRN: [id number] Date of Birth: [birth year] Referring Provider:   Flowsheet Row CARDIAC REHAB PHASE II ORIENTATION from 04/03/2024 in [facility] CARDIAC REHABILITATION  Referring Provider Kate Bruckner MD    Encounter Date: 05/09/2024  Check In:  Session Check In - 05/09/24 1329       Check-In   Supervising physician immediately available to respond to emergencies See telemetry face sheet for immediately available MD    Location [facility]    Staff Present Laymon Rattler, BSN, RN, WTA-C;Heather Con, BS, Exercise Physiologist;Ladaja Yusupov Zina, RN    Virtual Visit No    Medication changes reported     No    Fall or balance concerns reported    No    Warm-up and Cool-down Performed on first and last piece of equipment    Resistance Training Performed Yes    VAD Patient? No    PAD/SET Patient? No      Pain Assessment   Currently in Pain? No/denies          Capillary Blood Glucose: No results found for this or any previous visit (from the past 24 hours).    Tobacco Use History[1]  Goals Met:  Independence with exercise equipment Exercise tolerated well No report of concerns or symptoms today Strength training completed today  Goals Unmet:  Not Applicable  Comments: Pt able to follow exercise prescription today without complaint.  Will continue to monitor for progression.        [1]  Social History Tobacco Use  Smoking Status Former   Current packs/day: 0.00   Average packs/day: 0.5 packs/day for 40.0 years (20.0 ttl pk-yrs)   Types: Cigarettes   Start date: 08/25/1971   Quit date: 08/25/2011   Years since quitting: 12.7  Smokeless Tobacco Never

## 2024-05-11 ENCOUNTER — Encounter (HOSPITAL_COMMUNITY)

## 2024-05-11 ENCOUNTER — Other Ambulatory Visit: Payer: Self-pay | Admitting: Internal Medicine

## 2024-05-16 ENCOUNTER — Encounter (HOSPITAL_COMMUNITY)

## 2024-05-16 ENCOUNTER — Encounter (HOSPITAL_COMMUNITY): Payer: Self-pay

## 2024-05-16 DIAGNOSIS — Z9861 Coronary angioplasty status: Secondary | ICD-10-CM

## 2024-05-16 NOTE — Progress Notes (Signed)
 Cardiac Individual Treatment Plan  Patient Details  Name: Daniel Carey MRN: [id number] Date of Birth: 1950/12/04 Referring Provider:   Flowsheet Row CARDIAC REHAB PHASE II ORIENTATION from 04/03/2024 in [facility] [facility]  Referring Provider Kate Bruckner MD    Initial Encounter Date:  Flowsheet Row CARDIAC REHAB PHASE II ORIENTATION from 04/03/2024 in [place] [facility]  Date 04/03/24    Visit Diagnosis: S/P PTCA (percutaneous transluminal coronary angioplasty)  Patient's Home Medications on Admission: Current Medications[1]  Past Medical History: 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    noted on EKG March 2013   CAD (coronary artery disease)    s/p atherectomy, PTCA/DES x2 mid & distal RCA 06/30/12   Carotid artery occlusion    Chest pain    Chronic kidney disease    Stone   Complication of anesthesia    heart rate dropping with propofol  last procedure   COPD (chronic obstructive pulmonary disease) (HCC)    Dyspnea    ED (erectile dysfunction)    Esophageal stricture    External hemorrhoids without mention of complication    GERD (gastroesophageal reflux disease)    Hiatal hernia    History of kidney stones    HLD (hyperlipidemia)    HTN (hypertension)    Myocardial infarction (HCC)    1996   Personal history of colonic polyps 03/12/2010   TUBULAR ADENOMA   Pneumonia    Presence of permanent cardiac pacemaker    Sleep apnea    wears CPAP   Tobacco abuse    Tubular adenoma of colon    Type 2 diabetes mellitus (HCC)     Tobacco Use: Tobacco Use History[2]  Labs: Review Flowsheet  More data exists      Latest Ref Rng & Units 07/06/2023 10/29/2023 11/15/2023 12/30/2023 05/02/2024  Labs for ITP Cardiac and Pulmonary Rehab  Cholestrol 0 - 200 mg/dL 833  - 836  883  -  LDL (calc) 0 - 99 mg/dL 50  - 88  46  -  HDL-C >39.00 mg/dL 53.99  - 54  59.79  -  Trlycerides 0.0 -  149.0 mg/dL 651.9  - 883  849.9  -  Hemoglobin A1c 4.0 - 5.6 % 7.5  6.9  7.5  - 7.4      Exercise Target Goals: Exercise Program Goal: Individual exercise prescription set using results from initial 6 min walk test and THRR while considering  patients activity barriers and safety.   Exercise Prescription Goal: Initial exercise prescription builds to 30-45 minutes a day of aerobic activity, 2-3 days per week.  Home exercise guidelines will be given to patient during program as part of exercise prescription that the participant will acknowledge.   Education: Aerobic Exercise: - Group verbal and visual presentation on the components of exercise prescription. Introduces F.I.T.T principle from ACSM for exercise prescriptions.  Reviews F.I.T.T. principles of aerobic exercise including progression. Written material provided at class time.   Education: Resistance Exercise: - Group verbal and visual presentation on the components of exercise prescription. Introduces F.I.T.T principle from ACSM for exercise prescriptions  Reviews F.I.T.T. principles of resistance exercise including progression. Written material provided at class time.    Education: Exercise & Equipment Safety: - Individual verbal instruction and demonstration of equipment use and safety with use of the equipment.   Education: Exercise Physiology & General Exercise Guidelines: - Group verbal and written instruction with models  to review the exercise physiology of the cardiovascular system and associated critical values. Provides general exercise guidelines with specific guidelines to those with heart or lung disease. Written material provided at class time.   Education: Flexibility, Balance, Mind/Body Relaxation: - Group verbal and visual presentation with interactive activity on the components of exercise prescription. Introduces F.I.T.T principle from ACSM for exercise prescriptions. Reviews F.I.T.T. principles of flexibility  and balance exercise training including progression. Also discusses the mind body connection.  Reviews various relaxation techniques to help reduce and manage stress (i.e. Deep breathing, progressive muscle relaxation, and visualization). Balance handout provided to take home. Written material provided at class time.   Activity Barriers & Risk Stratification:  Activity Barriers & Cardiac Risk Stratification - 03/28/24 1303       Activity Barriers & Cardiac Risk Stratification   Activity Barriers Muscular Weakness;Deconditioning;Arthritis   right hip   Cardiac Risk Stratification Moderate          6 Minute Walk:  6 Minute Walk     Row Name 04/03/24 1404         6 Minute Walk   Phase Initial     Distance 1200 feet     Walk Time 6 minutes     # of Rest Breaks 0     MPH 2.27     METS 2.9     RPE 12     VO2 Peak 10.21     Symptoms No     Resting HR 66 bpm     Resting BP 170/82     Resting Oxygen Saturation  96 %     Exercise Oxygen Saturation  during 6 min walk 96 %     Max Ex. HR 98 bpm     Max Ex. BP 156/80     2 Minute Post BP 140/80        Oxygen Initial Assessment:   Oxygen Re-Evaluation:   Oxygen Discharge (Final Oxygen Re-Evaluation):   Initial Exercise Prescription:  Initial Exercise Prescription - 04/03/24 1400       Date of Initial Exercise RX and Referring Provider   Date 04/03/24    Referring Provider Kate Bruckner MD      Treadmill   MPH 1.5    Grade 0    Minutes 15    METs 2.15      REL-XR   Level 2    Speed 50    Minutes 15    METs 2.4      Prescription Details   Frequency (times per week) 2    Duration Progress to 30 minutes of continuous aerobic without signs/symptoms of physical distress      Intensity   THRR 40-80% of Max Heartrate 99-132    Ratings of Perceived Exertion 11-13      Resistance Training   Training Prescription Yes    Weight 5    Reps 10-15          Perform Capillary Blood Glucose checks as  needed.  Exercise Prescription Changes:   Exercise Prescription Changes     Row Name 04/03/24 1400 04/18/24 1500           Response to Exercise   Blood Pressure (Admit) 170/82 128/76      Blood Pressure (Exercise) 156/80 160/90      Blood Pressure (Exit) 140/80 120/62      Heart Rate (Admit) 66 bpm 62 bpm      Heart Rate (Exercise) 98 bpm 93 bpm  Heart Rate (Exit) 75 bpm 70 bpm      Oxygen Saturation (Admit) 96 % --      Oxygen Saturation (Exercise) 96 % --      Oxygen Saturation (Exit) 96 % --      Rating of Perceived Exertion (Exercise) 12 12      Duration -- Continue with 30 min of aerobic exercise without signs/symptoms of physical distress.      Intensity -- THRR unchanged        Progression   Progression -- Continue to progress workloads to maintain intensity without signs/symptoms of physical distress.        Resistance Training   Weight -- 5      Reps -- 10-15        Treadmill   MPH -- 2      Grade -- 0.5      Minutes -- 15      METs -- 2.67        REL-XR   Level -- 2      Speed -- 72      Minutes -- 15      METs -- 5.6         Exercise Comments:   Exercise Comments     Row Name 03/28/24 1310 04/04/24 1329         Exercise Comments Daniel Carey states  that he walks on the treadmill at home. First full day of exercise!  Patient was oriented to gym and equipment including functions, settings, policies, and procedures.  Patient's individual exercise prescription and treatment plan were reviewed.  All starting workloads were established based on the results of the 6 minute walk test done at initial orientation visit.  The plan for exercise progression was also introduced and progression will be customized based on patient's performance and goals.         Exercise Goals and Review:   Exercise Goals     Row Name 03/28/24 1310             Exercise Goals   Increase Physical Activity Yes       Intervention Provide advice, education, support and  counseling about physical activity/exercise needs.;Develop an individualized exercise prescription for aerobic and resistive training based on initial evaluation findings, risk stratification, comorbidities and participant's personal goals.       Expected Outcomes Short Term: Attend rehab on a regular basis to increase amount of physical activity.;Long Term: Exercising regularly at least 3-5 days a week.;Long Term: Add in home exercise to make exercise part of routine and to increase amount of physical activity.       Increase Strength and Stamina Yes       Intervention Provide advice, education, support and counseling about physical activity/exercise needs.;Develop an individualized exercise prescription for aerobic and resistive training based on initial evaluation findings, risk stratification, comorbidities and participant's personal goals.       Expected Outcomes Short Term: Increase workloads from initial exercise prescription for resistance, speed, and METs.;Short Term: Perform resistance training exercises routinely during rehab and add in resistance training at home;Long Term: Improve cardiorespiratory fitness, muscular endurance and strength as measured by increased METs and functional capacity ( )       Able to understand and use rate of perceived exertion (RPE) scale Yes       Intervention Provide education and explanation on how to use RPE scale       Expected Outcomes Short Term: Able to use RPE daily in  rehab to express subjective intensity level;Long Term:  Able to use RPE to guide intensity level when exercising independently       Knowledge and understanding of Target Heart Rate Range (THRR) Yes       Intervention Provide education and explanation of THRR including how the numbers were predicted and where they are located for reference       Expected Outcomes Short Term: Able to state/look up THRR;Long Term: Able to use THRR to govern intensity when exercising independently;Short Term:  Able to use daily as guideline for intensity in rehab       Able to check pulse independently Yes       Intervention Provide education and demonstration on how to check pulse in carotid and radial arteries.;Review the importance of being able to check your own pulse for safety during independent exercise       Expected Outcomes Long Term: Able to check pulse independently and accurately;Short Term: Able to explain why pulse checking is important during independent exercise       Understanding of Exercise Prescription Yes       Intervention Provide education, explanation, and written materials on patient's individual exercise prescription       Expected Outcomes Short Term: Able to explain program exercise prescription;Long Term: Able to explain home exercise prescription to exercise independently          Exercise Goals Re-Evaluation :  Exercise Goals Re-Evaluation     Row Name 04/04/24 1330             Exercise Goal Re-Evaluation   Exercise Goals Review Able to understand and use rate of perceived exertion (RPE) scale;Knowledge and understanding of Target Heart Rate Range (THRR)       Comments Reviewed RPE and dyspnea scale, THR and program prescription with pt today.  Pt voiced understanding and was given a copy of goals to take home.       Expected Outcomes Short: Use RPE daily to regulate intensity.  Long: Follow program prescription in THR.          Discharge Exercise Prescription (Final Exercise Prescription Changes):  Exercise Prescription Changes - 04/18/24 1500       Response to Exercise   Blood Pressure (Admit) 128/76    Blood Pressure (Exercise) 160/90    Blood Pressure (Exit) 120/62    Heart Rate (Admit) 62 bpm    Heart Rate (Exercise) 93 bpm    Heart Rate (Exit) 70 bpm    Rating of Perceived Exertion (Exercise) 12    Duration Continue with 30 min of aerobic exercise without signs/symptoms of physical distress.    Intensity THRR unchanged      Progression    Progression Continue to progress workloads to maintain intensity without signs/symptoms of physical distress.      Resistance Training   Weight 5    Reps 10-15      Treadmill   MPH 2    Grade 0.5    Minutes 15    METs 2.67      REL-XR   Level 2    Speed 72    Minutes 15    METs 5.6          Nutrition:  Target Goals: Understanding of nutrition guidelines, daily intake of sodium 1500mg , cholesterol 200mg , calories 30% from fat and 7% or less from saturated fats, daily to have 5 or more servings of fruits and vegetables.  Education: Nutrition 1 -Group instruction provided by verbal,  written material, interactive activities, discussions, models, and posters to present general guidelines for heart healthy nutrition including macronutrients, label reading, and promoting whole foods over processed counterparts. Education serves as pensions consultant of discussion of heart healthy eating for all. Written material provided at class time. Flowsheet Row CARDIAC REHAB PHASE II EXERCISE from 05/04/2024 in [place] [facility]  Education need identified 04/03/24  Date 05/04/24  Educator [facility]  Instruction Review Code 1- Verbalizes Understanding     Education: Nutrition 2 -Group instruction provided by verbal, written material, interactive activities, discussions, models, and posters to present general guidelines for heart healthy nutrition including sodium, cholesterol, and saturated fat. Providing guidance of habit forming to improve blood pressure, cholesterol, and body weight. Written material provided at class time. Flowsheet Row CARDIAC REHAB PHASE II EXERCISE from 05/04/2024 in [place]  Education need identified 04/03/24  Date 05/04/24  Educator [facility]  Instruction Review Code 1- Verbalizes Understanding      Biometrics:  Pre Biometrics - 04/03/24 1407       Pre Biometrics   Height 5' 11 (1.803 m)    Weight 189 lb 2.5 oz (85.8 kg)    Waist  Circumference 40 inches    Hip Circumference 42 inches    Waist to Hip Ratio 0.95 %    BMI (Calculated) 26.39    Grip Strength 27.7 kg    Single Leg Stand 23.46 seconds           Nutrition Therapy Plan and Nutrition Goals:  Nutrition Therapy & Goals - 03/28/24 1315       Intervention Plan   Intervention Prescribe, educate and counsel regarding individualized specific dietary modifications aiming towards targeted core components such as weight, hypertension, lipid management, diabetes, heart failure and other comorbidities.;Nutrition handout(s) given to patient.    Expected Outcomes Long Term Goal: Adherence to prescribed nutrition plan.;Short Term Goal: A plan has been developed with personal nutrition goals set during dietitian appointment.;Short Term Goal: Understand basic principles of dietary content, such as calories, fat, sodium, cholesterol and nutrients.          Nutrition Assessments:  MEDIFICTS Score Key: >=70 Need to make dietary changes  40-70 Heart Healthy Diet <= 40 Therapeutic Level Cholesterol Diet  Flowsheet Row CARDIAC REHAB PHASE II ORIENTATION from 04/03/2024 in [facility]  Picture Your Plate Total Score on Admission 62   Picture Your Plate Scores: <59 Unhealthy dietary pattern with much room for improvement. 41-50 Dietary pattern unlikely to meet recommendations for good health and room for improvement. 51-60 More healthful dietary pattern, with some room for improvement.  >60 Healthy dietary pattern, although there may be some specific behaviors that could be improved.    Nutrition Goals Re-Evaluation:   Nutrition Goals Discharge (Final Nutrition Goals Re-Evaluation):   Psychosocial: Target Goals: Acknowledge presence or absence of significant depression and/or stress, maximize coping skills, provide positive support system. Participant is able to verbalize types and ability to use techniques and skills needed for reducing  stress and depression.   Education: Stress, Anxiety, and Depression - Group verbal and visual presentation to define topics covered.  Reviews how body is impacted by stress, anxiety, and depression.  Also discusses healthy ways to reduce stress and to treat/manage anxiety and depression. Written material provided at class time.   Education: Sleep Hygiene -Provides group verbal and written instruction about how sleep can affect your health.  Define sleep hygiene, discuss sleep cycles and impact of sleep habits. Review good  sleep hygiene tips.   Initial Review & Psychosocial Screening:  Initial Psych Review & Screening - 03/28/24 1315       Initial Review   Current issues with None Identified      Family Dynamics   Good Support System? Yes      Barriers   Psychosocial barriers to participate in program There are no identifiable barriers or psychosocial needs.      Screening Interventions   Interventions Encouraged to exercise    Expected Outcomes Long Term goal: The participant improves quality of Life and PHQ9 Scores as seen by post scores and/or verbalization of changes;Short Term goal: Identification and review with participant of any Quality of Life or Depression concerns found by scoring the questionnaire.;Long Term Goal: Stressors or current issues are controlled or eliminated.;Short Term goal: Utilizing psychosocial counselor, staff and physician to assist with identification of specific Stressors or current issues interfering with healing process. Setting desired goal for each stressor or current issue identified.          Quality of Life Scores:   Quality of Life - 04/03/24 1429       Quality of Life   Select Quality of Life      Quality of Life Scores   Health/Function Pre 22.03 %    Socioeconomic Pre 23.38 %    Psych/Spiritual Pre 30 %    Family Pre 26.4 %    GLOBAL Pre 24.56 %         Scores of 19 and below usually indicate a poorer quality of life in these  areas.  A difference of  2-3 points is a clinically meaningful difference.  A difference of 2-3 points in the total score of the Quality of Life Index has been associated with significant improvement in overall quality of life, self-image, physical symptoms, and general health in studies assessing change in quality of life.  PHQ-9: Review Flowsheet  More data exists      04/03/2024 01/13/2024 12/30/2023 07/06/2023 12/22/2022  Depression screen PHQ 2/9  Decreased Interest 0 0 0 0 0  Down, Depressed, Hopeless 0 - 0 0 0  PHQ - 2 Score 0 0 0 0 0  Altered sleeping 0 - - - -  Tired, decreased energy 1 - - - -  Change in appetite 0 - - - -  Feeling bad or failure about yourself  0 - - - -  Trouble concentrating 0 - - - -  Moving slowly or fidgety/restless 0 - - - -  Suicidal thoughts 0 - - - -  PHQ-9 Score 1 - - - -  Difficult doing work/chores Not difficult at all - - - -   Interpretation of Total Score  Total Score Depression Severity:  1-4 = Minimal depression, 5-9 = Mild depression, 10-14 = Moderate depression, 15-19 = Moderately severe depression, 20-27 = Severe depression   Psychosocial Evaluation and Intervention:  Psychosocial Evaluation - 03/28/24 1315       Psychosocial Evaluation & Interventions   Interventions Encouraged to exercise with the program and follow exercise prescription    Comments Daniel Carey is a pleasant 74 year old male who is coming into cardiac rehab for S/P PTCA. He has went through the program before. He has his wife as his main support system. He currently exercises some at home by walking on the treadmill. He identifies no current stressors or sleep issues. He has a pacemaker and history of sleep apnea with wearing a CPAP at night.  He is eager to start the program.    Expected Outcomes Short: Increase strength and stamina. Long: Get back in shape.    Continue Psychosocial Services  Follow up required by staff          Psychosocial  Re-Evaluation:   Psychosocial Discharge (Final Psychosocial Re-Evaluation):   Vocational Rehabilitation: Provide vocational rehab assistance to qualifying candidates.   Vocational Rehab Evaluation & Intervention:  Vocational Rehab - 03/28/24 1305       Initial Vocational Rehab Evaluation & Intervention   Assessment shows need for Vocational Rehabilitation No          Education: Education Goals: Education classes will be provided on a variety of topics geared toward better understanding of heart health and risk factor modification. Participant will state understanding/return demonstration of topics presented as noted by education test scores.  Learning Barriers/Preferences:  Learning Barriers/Preferences - 03/28/24 1304       Learning Barriers/Preferences   Learning Barriers None    Learning Preferences None          General Cardiac Education Topics:  AED/CPR: - Group verbal and written instruction with the use of models to demonstrate the basic use of the AED with the basic ABC's of resuscitation.   Test and Procedures: - Group verbal and visual presentation and models provide information about basic cardiac anatomy and function. Reviews the testing methods done to diagnose heart disease and the outcomes of the test results. Describes the treatment choices: Medical Management, Angioplasty, or Coronary Bypass Surgery for treating various heart conditions including Myocardial Infarction, Angina, Valve Disease, and Cardiac Arrhythmias. Written material provided at class time. Flowsheet Row CARDIAC REHAB PHASE II EXERCISE from 05/04/2024 in [place] [facility]  Date 04/06/24  Educator [facility]  Instruction Review Code 1- Verbalizes Understanding    Medication Safety: - Group verbal and visual instruction to review commonly prescribed medications for heart and lung disease. Reviews the medication, class of the drug, and side effects. Includes the steps to properly  store meds and maintain the prescription regimen. Written material provided at class time.   Intimacy: - Group verbal instruction through game format to discuss how heart and lung disease can affect sexual intimacy. Written material provided at class time.   Know Your Numbers and Heart Failure: - Group verbal and visual instruction to discuss disease risk factors for cardiac and pulmonary disease and treatment options.  Reviews associated critical values for Overweight/Obesity, Hypertension, Cholesterol, and Diabetes.  Discusses basics of heart failure: signs/symptoms and treatments.  Introduces Heart Failure Zone chart for action plan for heart failure. Written material provided at class time.   Infection Prevention: - Provides verbal and written material to individual with discussion of infection control including proper hand washing and proper equipment cleaning during exercise session.   Falls Prevention: - Provides verbal and written material to individual with discussion of falls prevention and safety.   Other: -Provides group and verbal instruction on various topics (see comments) Flowsheet Row CARDIAC REHAB PHASE II EXERCISE from 05/04/2024 in [place] [facility]  Date 04/27/24  Educator jh  Instruction Review Code 2- Demonstrated Understanding    Knowledge Questionnaire Score:  Knowledge Questionnaire Score - 04/03/24 1429       Knowledge Questionnaire Score   Pre Score 25/26          Core Components/Risk Factors/Patient Goals at Admission:  Personal Goals and Risk Factors at Admission - 03/28/24 1314       Core Components/Risk Factors/Patient Goals  on Admission   Hypertension Yes    Intervention Provide education on lifestyle modifcations including regular physical activity/exercise, weight management, moderate sodium restriction and increased consumption of fresh fruit, vegetables, and low fat dairy, alcohol moderation, and smoking  cessation.;Monitor prescription use compliance.    Expected Outcomes Short Term: Continued assessment and intervention until BP is < 140/65mm HG in hypertensive participants. < 130/46mm HG in hypertensive participants with diabetes, heart failure or chronic kidney disease.;Long Term: Maintenance of blood pressure at goal levels.    Lipids Yes    Intervention Provide education and support for participant on nutrition & aerobic/resistive exercise along with prescribed medications to achieve LDL 70mg , HDL >40mg .    Expected Outcomes Short Term: Participant states understanding of desired cholesterol values and is compliant with medications prescribed. Participant is following exercise prescription and nutrition guidelines.;Long Term: Cholesterol controlled with medications as prescribed, with individualized exercise RX and with personalized nutrition plan. Value goals: LDL < 70mg , HDL > 40 mg.          Education:Diabetes - Individual verbal and written instruction to review signs/symptoms of diabetes, desired ranges of glucose level fasting, after meals and with exercise. Acknowledge that pre and post exercise glucose checks will be done for 3 sessions at entry of program.   Core Components/Risk Factors/Patient Goals Review:    Core Components/Risk Factors/Patient Goals at Discharge (Final Review):    ITP Comments:  ITP Comments     Row Name 03/28/24 1314 04/03/24 1416 04/04/24 1329 04/18/24 1605 05/16/24 1034   ITP Comments Completed virtual orientation today.  EP evaluation is scheduled for 04/03/24 at 0830 .  Documentation for diagnosis can be found in [facility] encounter 02/16/24. Patient arrived for 1st visit/orientation/education at 1300. Patient was referred to CR by Dr. Lonni Nanas due to Status Post PTCA. During orientation advised patient on arrival and appointment times what to wear, what to do before, during and after exercise. Reviewed attendance and class policy.  Pt is  scheduled to return Cardiac Rehab on 04/04/24 at 1330. Pt was advised to come to class 15 minutes before class starts.  Discussed RPE/Dpysnea scales. Patient participated in warm up stretches. Patient was able to complete 6 minute walk test.  Telemetry:NSR. Patient was measured for the equipment. Discussed equipment safety with patient. Took patient pre-anthropometric measurements. Patient finished visit at 1410. First full day of exercise!  Patient was oriented to gym and equipment including functions, settings, policies, and procedures.  Patient's individual exercise prescription and treatment plan were reviewed.  All starting workloads were established based on the results of the 6 minute walk test done at initial orientation visit.  The plan for exercise progression was also introduced and progression will be customized based on patient's performance and goals. 30 day review completed. ITP sent to Dr. Dorn Ross, Medical Director of Cardiac Rehab. Continue with ITP unless changes are made by physician.  New to program 30 day review completed. ITP sent to Dr. Dorn Ross, Medical Director of Cardiac Rehab. Continue with ITP unless changes are made by physician.  New to program      Comments: 30 day review     [1]  Current Outpatient Medications:    Accu-Chek Softclix Lancets lancets, Use as instructed 1x a day, Disp: 100 each, Rfl: 3   acetaminophen  (TYLENOL ) 500 MG tablet, Take 1,000 mg by mouth every 6 (six) hours as needed (pain). , Disp: , Rfl:    albuterol  (VENTOLIN  HFA) 108 (90 Base) MCG/ACT inhaler, INHALE 2  PUFFS INTO THE LUNGS EVERY 6 HOURS AS NEEDED FOR WHEEZE OR SHORTNESS OF BREATH (Patient taking differently: as needed.), Disp: 8.5 each, Rfl: 3   aspirin  EC 81 MG tablet, Take 1 tablet (81 mg total) by mouth daily. Swallow whole., Disp: , Rfl:    Blood Glucose Monitoring Suppl (ACCU-CHEK GUIDE) w/Device KIT, Use as advised, Disp: 1 kit, Rfl: 0   clopidogrel  (PLAVIX ) 75 MG  tablet, Take 1 tablet (75 mg total) by mouth daily with breakfast., Disp: 90 tablet, Rfl: 2   diclofenac (VOLTAREN) 75 MG EC tablet, Take 75 mg by mouth 2 (two) times daily. (Patient taking differently: Take 75 mg by mouth as needed.), Disp: , Rfl:    ezetimibe  (ZETIA ) 10 MG tablet, Take 1 tablet (10 mg total) by mouth daily., Disp: 90 tablet, Rfl: 3   famotidine  (PEPCID ) 20 MG tablet, Take 20 mg by mouth daily as needed for heartburn. (Patient not taking: Reported on 05/02/2024), Disp: , Rfl:    fenofibrate  (TRICOR ) 145 MG tablet, TAKE 1 TABLET BY MOUTH EVERY DAY, Disp: 90 tablet, Rfl: 3   glucose blood test strip, Use as instructed 1x a day, Disp: 100 each, Rfl: 3   hydrochlorothiazide  (HYDRODIURIL ) 25 MG tablet, TAKE 1/2 TABLET (12.5 MG TOTAL) BY MOUTH DAILY AS NEEDED., Disp: 45 tablet, Rfl: 2   ipratropium (ATROVENT ) 0.06 % nasal spray, PLACE 2 SPRAYS INTO BOTH NOSTRILS 4 (FOUR) TIMES DAILY AS DIRECTED, Disp: 90 mL, Rfl: 1   metoprolol  tartrate (LOPRESSOR ) 25 MG tablet, Take 0.5 tablets (12.5 mg total) by mouth 2 (two) times daily., Disp: 90 tablet, Rfl: 3   nitroGLYCERIN  (NITROSTAT ) 0.4 MG SL tablet, Place 1 tablet (0.4 mg total) under the tongue every 5 (five) minutes as needed for chest pain., Disp: 25 tablet, Rfl: 3   pantoprazole  (PROTONIX ) 40 MG tablet, Take 1 tablet (40 mg total) by mouth daily., Disp: 30 tablet, Rfl: 6   Pharmacist Choice Lancets MISC, Use to check blood sugar 1-2 times a day, Disp: 200 each, Rfl: 5   Propylene Glycol (SYSTANE COMPLETE) 0.6 % SOLN, Place 1 drop into both eyes daily. (Patient not taking: Reported on 05/02/2024), Disp: , Rfl:    rosuvastatin  (CRESTOR ) 20 MG tablet, Take 1 tablet (20 mg total) by mouth daily., Disp: 90 tablet, Rfl: 3   sildenafil  (VIAGRA ) 100 MG tablet, Take 1 tablet by mouth prior to sexual activity as needed (Patient taking differently: as needed. Take 1 tablet by mouth prior to sexual activity as needed), Disp: 15 tablet, Rfl: 11 [2]   Social History Tobacco Use  Smoking Status Former   Current packs/day: 0.00   Average packs/day: 0.5 packs/day for 40.0 years (20.0 ttl pk-yrs)   Types: Cigarettes   Start date: 08/25/1971   Quit date: 08/25/2011   Years since quitting: 12.7  Smokeless Tobacco Never

## 2024-05-18 ENCOUNTER — Ambulatory Visit
Admission: RE | Admit: 2024-05-18 | Discharge: 2024-05-18 | Disposition: A | Discharge status: Home / Self Care | Source: Ambulatory Visit | Attending: Vascular Surgery | Admitting: Vascular Surgery

## 2024-05-18 ENCOUNTER — Encounter (HOSPITAL_COMMUNITY)

## 2024-05-18 DIAGNOSIS — I6523 Occlusion and stenosis of bilateral carotid arteries: Secondary | ICD-10-CM

## 2024-05-18 MED ORDER — IOPAMIDOL (ISOVUE-370) INJECTION 76%
75.0000 mL | Freq: Once | INTRAVENOUS | Status: AC | PRN
  Administered 2024-05-18: 75 mL via INTRAVENOUS

## 2024-05-23 ENCOUNTER — Encounter (HOSPITAL_COMMUNITY)

## 2024-05-25 ENCOUNTER — Encounter (HOSPITAL_COMMUNITY)
Admission: RE | Admit: 2024-05-25 | Discharge: 2024-05-25 | Disposition: A | Source: Ambulatory Visit | Attending: Cardiology

## 2024-05-25 DIAGNOSIS — Z9861 Coronary angioplasty status: Secondary | ICD-10-CM

## 2024-05-25 NOTE — Progress Notes (Signed)
 Daily Session Note  Patient Details  Name: Daniel Carey MRN: [id number] Date of Birth: February 19, 1951 Referring Provider:   Flowsheet Row CARDIAC REHAB PHASE II ORIENTATION from 04/03/2024 in [facility] CARDIAC REH[facility]IT[facility]  Referring Provider Kate Bruckner MD    Encounter Date: 05/25/2024  Check In:  Session Check In - 05/25/24 1328       Check-In   Supervising physician immediately available to respond to emergencies See telemetry face sheet for immediately available MD    Location [facility]    Staff Present Powell Benders, BS, Exercise Physiologist;Letzy Gullickson Dean BSN, RN    Virtual Visit No    Medication changes reported     No    Fall or balance concerns reported    No    Tobacco Cessation No Change    Warm-up and Cool-down Performed on first and last piece of equipment    Resistance Training Performed Yes    VAD Patient? No    PAD/SET Patient? No      Pain Assessment   Currently in Pain? No/denies    Pain Score 0-No pain    Multiple Pain Sites No          Capillary Blood Glucose: No results found for this or any previous visit (from the past 24 hours).    Tobacco Use History[1]  Goals Met:  Independence with exercise equipment Exercise tolerated well No report of concerns or symptoms today Strength training completed today  Goals Unmet:  Not Applicable  Comments: SABRASABRAPt able to follow exercise prescription today without complaint.  Will continue to monitor for progression.        [1]  Social History Tobacco Use  Smoking Status Former   Current packs/day: 0.00   Average packs/day: 0.5 packs/day for 40.0 years (20.0 ttl pk-yrs)   Types: Cigarettes   Start date: 08/25/1971   Quit date: 08/25/2011   Years since quitting: 12.7  Smokeless Tobacco Never

## 2024-05-30 ENCOUNTER — Encounter (HOSPITAL_COMMUNITY)

## 2024-06-01 ENCOUNTER — Encounter (HOSPITAL_COMMUNITY)

## 2024-06-06 ENCOUNTER — Encounter (HOSPITAL_COMMUNITY)

## 2024-06-07 ENCOUNTER — Ambulatory Visit: Admitting: Vascular Surgery

## 2024-06-08 ENCOUNTER — Encounter (HOSPITAL_COMMUNITY)

## 2024-06-13 ENCOUNTER — Encounter (HOSPITAL_COMMUNITY)

## 2024-06-15 ENCOUNTER — Encounter (HOSPITAL_COMMUNITY)

## 2024-06-20 ENCOUNTER — Encounter (HOSPITAL_COMMUNITY)

## 2024-06-22 ENCOUNTER — Ambulatory Visit: Admitting: Cardiology

## 2024-06-22 ENCOUNTER — Encounter (HOSPITAL_COMMUNITY)

## 2024-06-27 ENCOUNTER — Encounter (HOSPITAL_COMMUNITY)

## 2024-06-29 ENCOUNTER — Encounter

## 2024-06-29 ENCOUNTER — Encounter (HOSPITAL_COMMUNITY)

## 2024-07-03 ENCOUNTER — Ambulatory Visit: Admitting: Internal Medicine

## 2024-07-04 ENCOUNTER — Encounter (HOSPITAL_COMMUNITY)

## 2024-07-06 ENCOUNTER — Encounter (HOSPITAL_COMMUNITY)

## 2024-07-11 ENCOUNTER — Encounter (HOSPITAL_COMMUNITY)

## 2024-07-13 ENCOUNTER — Encounter (HOSPITAL_COMMUNITY)

## 2024-07-18 ENCOUNTER — Encounter (HOSPITAL_COMMUNITY)

## 2024-07-20 ENCOUNTER — Encounter (HOSPITAL_COMMUNITY)

## 2024-07-25 ENCOUNTER — Encounter (HOSPITAL_COMMUNITY)

## 2024-07-27 ENCOUNTER — Encounter (HOSPITAL_COMMUNITY)

## 2024-08-01 ENCOUNTER — Encounter (HOSPITAL_COMMUNITY)

## 2024-08-28 ENCOUNTER — Ambulatory Visit: Admitting: Internal Medicine

## 2024-09-28 ENCOUNTER — Encounter

## 2024-12-28 ENCOUNTER — Encounter

## 2025-01-01 ENCOUNTER — Encounter: Admitting: Family Medicine
